# Patient Record
Sex: Female | Born: 1955 | Race: White | Hispanic: No | Smoking: Never smoker
Health system: Southern US, Community
[De-identification: ages and names within clinical notes are randomized; demographics above are authoritative.]

## PROBLEM LIST (undated history)

## (undated) DIAGNOSIS — E1165 Type 2 diabetes mellitus with hyperglycemia: Secondary | ICD-10-CM

## (undated) DIAGNOSIS — E785 Hyperlipidemia, unspecified: Secondary | ICD-10-CM

## (undated) DIAGNOSIS — M5136 Other intervertebral disc degeneration, lumbar region: Secondary | ICD-10-CM

## (undated) DIAGNOSIS — K589 Irritable bowel syndrome without diarrhea: Secondary | ICD-10-CM

## (undated) DIAGNOSIS — N183 Chronic kidney disease, stage 3 unspecified: Secondary | ICD-10-CM

## (undated) DIAGNOSIS — M549 Dorsalgia, unspecified: Secondary | ICD-10-CM

## (undated) DIAGNOSIS — Z87898 Personal history of other specified conditions: Secondary | ICD-10-CM

## (undated) DIAGNOSIS — Q249 Congenital malformation of heart, unspecified: Secondary | ICD-10-CM

## (undated) DIAGNOSIS — F419 Anxiety disorder, unspecified: Secondary | ICD-10-CM

## (undated) DIAGNOSIS — R109 Unspecified abdominal pain: Secondary | ICD-10-CM

## (undated) DIAGNOSIS — K529 Noninfective gastroenteritis and colitis, unspecified: Secondary | ICD-10-CM

## (undated) DIAGNOSIS — D649 Anemia, unspecified: Secondary | ICD-10-CM

## (undated) DIAGNOSIS — F329 Major depressive disorder, single episode, unspecified: Secondary | ICD-10-CM

## (undated) DIAGNOSIS — I10 Essential (primary) hypertension: Secondary | ICD-10-CM

## (undated) DIAGNOSIS — E119 Type 2 diabetes mellitus without complications: Secondary | ICD-10-CM

## (undated) DIAGNOSIS — IMO0002 Reserved for concepts with insufficient information to code with codable children: Secondary | ICD-10-CM

## (undated) DIAGNOSIS — G8929 Other chronic pain: Secondary | ICD-10-CM

## (undated) DIAGNOSIS — N390 Urinary tract infection, site not specified: Secondary | ICD-10-CM

## (undated) DIAGNOSIS — F32A Depression, unspecified: Secondary | ICD-10-CM

## (undated) DIAGNOSIS — M51369 Other intervertebral disc degeneration, lumbar region without mention of lumbar back pain or lower extremity pain: Secondary | ICD-10-CM

## (undated) DIAGNOSIS — N950 Postmenopausal bleeding: Secondary | ICD-10-CM

## (undated) DIAGNOSIS — N898 Other specified noninflammatory disorders of vagina: Secondary | ICD-10-CM

## (undated) DIAGNOSIS — N83202 Unspecified ovarian cyst, left side: Secondary | ICD-10-CM

## (undated) DIAGNOSIS — N281 Cyst of kidney, acquired: Secondary | ICD-10-CM

## (undated) HISTORY — DX: Hyperlipidemia, unspecified: E78.5

## (undated) HISTORY — DX: Essential (primary) hypertension: I10

## (undated) HISTORY — DX: Type 2 diabetes mellitus with hyperglycemia: E11.65

## (undated) HISTORY — DX: Chronic kidney disease, stage 3 (moderate): N18.3

## (undated) HISTORY — DX: Cyst of kidney, acquired: N28.1

## (undated) HISTORY — DX: Unspecified ovarian cyst, left side: N83.202

## (undated) HISTORY — DX: Chronic kidney disease, stage 3 unspecified: N18.30

## (undated) HISTORY — DX: Reserved for concepts with insufficient information to code with codable children: IMO0002

## (undated) HISTORY — PX: CARDIAC SURGERY: SHX584

## (undated) HISTORY — DX: Congenital malformation of heart, unspecified: Q24.9

## (undated) HISTORY — DX: Other specified noninflammatory disorders of vagina: N89.8

## (undated) HISTORY — DX: Postmenopausal bleeding: N95.0

## (undated) HISTORY — DX: Anemia, unspecified: D64.9

---

## 2000-03-13 ENCOUNTER — Emergency Department (HOSPITAL_COMMUNITY): Admission: EM | Admit: 2000-03-13 | Discharge: 2000-03-13 | Payer: Self-pay | Admitting: Emergency Medicine

## 2003-01-14 ENCOUNTER — Emergency Department (HOSPITAL_COMMUNITY): Admission: EM | Admit: 2003-01-14 | Discharge: 2003-01-14 | Payer: Self-pay | Admitting: Emergency Medicine

## 2003-01-20 ENCOUNTER — Encounter: Admission: RE | Admit: 2003-01-20 | Discharge: 2003-01-20 | Payer: Self-pay | Admitting: Internal Medicine

## 2003-01-25 ENCOUNTER — Emergency Department (HOSPITAL_COMMUNITY): Admission: AD | Admit: 2003-01-25 | Discharge: 2003-01-25 | Payer: Self-pay | Admitting: Family Medicine

## 2003-01-28 ENCOUNTER — Encounter: Admission: RE | Admit: 2003-01-28 | Discharge: 2003-01-28 | Payer: Self-pay | Admitting: Internal Medicine

## 2003-02-13 ENCOUNTER — Ambulatory Visit (HOSPITAL_COMMUNITY): Admission: RE | Admit: 2003-02-13 | Discharge: 2003-02-13 | Payer: Self-pay | Admitting: Internal Medicine

## 2003-02-13 ENCOUNTER — Encounter (INDEPENDENT_AMBULATORY_CARE_PROVIDER_SITE_OTHER): Payer: Self-pay | Admitting: Cardiology

## 2003-03-14 ENCOUNTER — Emergency Department (HOSPITAL_COMMUNITY): Admission: AD | Admit: 2003-03-14 | Discharge: 2003-03-14 | Payer: Self-pay | Admitting: Family Medicine

## 2003-04-02 ENCOUNTER — Encounter: Admission: RE | Admit: 2003-04-02 | Discharge: 2003-04-02 | Payer: Self-pay | Admitting: Internal Medicine

## 2003-05-29 ENCOUNTER — Encounter: Admission: RE | Admit: 2003-05-29 | Discharge: 2003-05-29 | Payer: Self-pay | Admitting: Internal Medicine

## 2003-06-10 ENCOUNTER — Emergency Department (HOSPITAL_COMMUNITY): Admission: EM | Admit: 2003-06-10 | Discharge: 2003-06-10 | Payer: Self-pay | Admitting: Family Medicine

## 2003-06-13 ENCOUNTER — Emergency Department (HOSPITAL_COMMUNITY): Admission: EM | Admit: 2003-06-13 | Discharge: 2003-06-13 | Payer: Self-pay | Admitting: Family Medicine

## 2003-07-03 ENCOUNTER — Encounter: Admission: RE | Admit: 2003-07-03 | Discharge: 2003-07-03 | Payer: Self-pay | Admitting: Obstetrics and Gynecology

## 2003-07-03 ENCOUNTER — Encounter (INDEPENDENT_AMBULATORY_CARE_PROVIDER_SITE_OTHER): Payer: Self-pay | Admitting: Specialist

## 2003-07-03 ENCOUNTER — Other Ambulatory Visit: Admission: RE | Admit: 2003-07-03 | Discharge: 2003-07-03 | Payer: Self-pay | Admitting: Family Medicine

## 2003-07-11 ENCOUNTER — Ambulatory Visit (HOSPITAL_COMMUNITY): Admission: RE | Admit: 2003-07-11 | Discharge: 2003-07-11 | Payer: Self-pay | Admitting: Obstetrics and Gynecology

## 2003-07-17 ENCOUNTER — Encounter: Admission: RE | Admit: 2003-07-17 | Discharge: 2003-07-17 | Payer: Self-pay | Admitting: Obstetrics and Gynecology

## 2003-08-19 ENCOUNTER — Encounter: Admission: RE | Admit: 2003-08-19 | Discharge: 2003-08-19 | Payer: Self-pay | Admitting: Internal Medicine

## 2003-09-06 ENCOUNTER — Emergency Department (HOSPITAL_COMMUNITY): Admission: EM | Admit: 2003-09-06 | Discharge: 2003-09-06 | Payer: Self-pay | Admitting: Family Medicine

## 2003-12-25 ENCOUNTER — Ambulatory Visit: Payer: Self-pay | Admitting: Internal Medicine

## 2003-12-29 ENCOUNTER — Ambulatory Visit: Payer: Self-pay | Admitting: Internal Medicine

## 2004-01-12 ENCOUNTER — Ambulatory Visit: Payer: Self-pay | Admitting: Internal Medicine

## 2004-03-30 ENCOUNTER — Ambulatory Visit: Payer: Self-pay | Admitting: Obstetrics and Gynecology

## 2004-03-30 ENCOUNTER — Ambulatory Visit: Payer: Self-pay | Admitting: Internal Medicine

## 2004-04-02 ENCOUNTER — Ambulatory Visit: Payer: Self-pay | Admitting: Internal Medicine

## 2004-04-20 ENCOUNTER — Ambulatory Visit: Payer: Self-pay | Admitting: Obstetrics and Gynecology

## 2004-05-04 ENCOUNTER — Ambulatory Visit: Payer: Self-pay | Admitting: Obstetrics and Gynecology

## 2004-07-06 ENCOUNTER — Ambulatory Visit: Payer: Self-pay | Admitting: Internal Medicine

## 2004-07-12 ENCOUNTER — Ambulatory Visit (HOSPITAL_COMMUNITY): Admission: RE | Admit: 2004-07-12 | Discharge: 2004-07-12 | Payer: Self-pay | Admitting: *Deleted

## 2004-08-03 ENCOUNTER — Ambulatory Visit: Payer: Self-pay | Admitting: Obstetrics & Gynecology

## 2004-08-03 ENCOUNTER — Encounter (INDEPENDENT_AMBULATORY_CARE_PROVIDER_SITE_OTHER): Payer: Self-pay | Admitting: *Deleted

## 2004-11-29 ENCOUNTER — Emergency Department (HOSPITAL_COMMUNITY): Admission: EM | Admit: 2004-11-29 | Discharge: 2004-11-29 | Payer: Self-pay | Admitting: Family Medicine

## 2004-12-13 ENCOUNTER — Ambulatory Visit: Payer: Self-pay | Admitting: Internal Medicine

## 2005-02-03 ENCOUNTER — Ambulatory Visit: Payer: Self-pay | Admitting: Family Medicine

## 2005-02-17 ENCOUNTER — Ambulatory Visit: Payer: Self-pay | Admitting: Internal Medicine

## 2005-02-21 ENCOUNTER — Ambulatory Visit: Payer: Self-pay | Admitting: Internal Medicine

## 2005-06-29 ENCOUNTER — Ambulatory Visit: Payer: Self-pay | Admitting: Internal Medicine

## 2005-07-14 ENCOUNTER — Ambulatory Visit (HOSPITAL_COMMUNITY): Admission: RE | Admit: 2005-07-14 | Discharge: 2005-07-14 | Payer: Self-pay | Admitting: Obstetrics and Gynecology

## 2005-08-03 ENCOUNTER — Ambulatory Visit: Payer: Self-pay | Admitting: Internal Medicine

## 2005-12-15 ENCOUNTER — Ambulatory Visit (HOSPITAL_COMMUNITY): Admission: RE | Admit: 2005-12-15 | Discharge: 2005-12-15 | Payer: Self-pay | Admitting: Hospitalist

## 2005-12-15 ENCOUNTER — Encounter (INDEPENDENT_AMBULATORY_CARE_PROVIDER_SITE_OTHER): Payer: Self-pay | Admitting: *Deleted

## 2005-12-15 ENCOUNTER — Ambulatory Visit: Payer: Self-pay | Admitting: Hospitalist

## 2005-12-15 LAB — CONVERTED CEMR LAB
Albumin: 4.1 g/dL (ref 3.5–5.2)
BUN: 23 mg/dL (ref 6–23)
CO2: 31 meq/L (ref 19–32)
Calcium: 9 mg/dL (ref 8.4–10.5)
Glucose, Bld: 106 mg/dL — ABNORMAL HIGH (ref 70–99)
Microalb Creat Ratio: 71.4 mg/g — ABNORMAL HIGH (ref 0.0–30.0)
Total Bilirubin: 0.3 mg/dL (ref 0.3–1.2)

## 2005-12-27 ENCOUNTER — Ambulatory Visit: Payer: Self-pay | Admitting: *Deleted

## 2005-12-27 ENCOUNTER — Inpatient Hospital Stay (HOSPITAL_COMMUNITY): Admission: EM | Admit: 2005-12-27 | Discharge: 2005-12-28 | Payer: Self-pay | Admitting: Emergency Medicine

## 2005-12-28 ENCOUNTER — Encounter (INDEPENDENT_AMBULATORY_CARE_PROVIDER_SITE_OTHER): Payer: Self-pay | Admitting: *Deleted

## 2005-12-28 DIAGNOSIS — E1169 Type 2 diabetes mellitus with other specified complication: Secondary | ICD-10-CM

## 2005-12-28 DIAGNOSIS — E1122 Type 2 diabetes mellitus with diabetic chronic kidney disease: Secondary | ICD-10-CM

## 2005-12-28 DIAGNOSIS — I10 Essential (primary) hypertension: Secondary | ICD-10-CM

## 2005-12-28 DIAGNOSIS — I152 Hypertension secondary to endocrine disorders: Secondary | ICD-10-CM | POA: Insufficient documentation

## 2005-12-28 DIAGNOSIS — E1159 Type 2 diabetes mellitus with other circulatory complications: Secondary | ICD-10-CM

## 2005-12-28 DIAGNOSIS — D649 Anemia, unspecified: Secondary | ICD-10-CM | POA: Insufficient documentation

## 2005-12-28 DIAGNOSIS — N183 Chronic kidney disease, stage 3 (moderate): Secondary | ICD-10-CM

## 2005-12-28 DIAGNOSIS — E785 Hyperlipidemia, unspecified: Secondary | ICD-10-CM

## 2005-12-29 ENCOUNTER — Emergency Department (HOSPITAL_COMMUNITY): Admission: EM | Admit: 2005-12-29 | Discharge: 2005-12-29 | Payer: Self-pay | Admitting: Family Medicine

## 2006-01-02 ENCOUNTER — Encounter (INDEPENDENT_AMBULATORY_CARE_PROVIDER_SITE_OTHER): Payer: Self-pay | Admitting: Internal Medicine

## 2006-01-02 ENCOUNTER — Ambulatory Visit: Payer: Self-pay | Admitting: Internal Medicine

## 2006-01-02 LAB — CONVERTED CEMR LAB
MCHC: 33.9 g/dL (ref 33.1–35.4)
MCV: 84.5 fL (ref 78.8–100.0)
Platelets: 271 10*3/uL (ref 152–374)

## 2006-01-04 ENCOUNTER — Ambulatory Visit: Payer: Self-pay | Admitting: Obstetrics and Gynecology

## 2006-01-18 DIAGNOSIS — N83209 Unspecified ovarian cyst, unspecified side: Secondary | ICD-10-CM

## 2006-03-13 ENCOUNTER — Telehealth: Payer: Self-pay | Admitting: *Deleted

## 2006-03-28 ENCOUNTER — Encounter: Payer: Self-pay | Admitting: *Deleted

## 2006-03-29 ENCOUNTER — Encounter (INDEPENDENT_AMBULATORY_CARE_PROVIDER_SITE_OTHER): Payer: Self-pay | Admitting: *Deleted

## 2006-03-29 ENCOUNTER — Ambulatory Visit: Payer: Self-pay | Admitting: Internal Medicine

## 2006-03-29 LAB — CONVERTED CEMR LAB
HCT: 42.2 % (ref 36.0–46.0)
Platelets: 229 10*3/uL (ref 150–400)
RBC: 4.98 M/uL (ref 3.87–5.11)
WBC: 8.5 10*3/uL (ref 4.0–10.5)

## 2006-04-04 ENCOUNTER — Ambulatory Visit: Payer: Self-pay | Admitting: Hospitalist

## 2006-04-04 LAB — CONVERTED CEMR LAB
Blood Glucose, Fingerstick: 189
Glucose, Bld: 189 mg/dL

## 2006-04-11 ENCOUNTER — Telehealth: Payer: Self-pay | Admitting: *Deleted

## 2006-04-18 ENCOUNTER — Encounter (INDEPENDENT_AMBULATORY_CARE_PROVIDER_SITE_OTHER): Payer: Self-pay | Admitting: *Deleted

## 2006-04-18 ENCOUNTER — Ambulatory Visit: Payer: Self-pay | Admitting: Internal Medicine

## 2006-04-18 LAB — CONVERTED CEMR LAB
BUN: 31 mg/dL — ABNORMAL HIGH (ref 6–23)
Glucose, Bld: 253 mg/dL — ABNORMAL HIGH (ref 70–99)
Potassium: 4.9 meq/L (ref 3.5–5.3)

## 2006-04-21 ENCOUNTER — Telehealth: Payer: Self-pay | Admitting: *Deleted

## 2006-05-23 ENCOUNTER — Ambulatory Visit: Payer: Self-pay | Admitting: *Deleted

## 2006-05-23 DIAGNOSIS — N951 Menopausal and female climacteric states: Secondary | ICD-10-CM | POA: Insufficient documentation

## 2006-05-23 DIAGNOSIS — R079 Chest pain, unspecified: Secondary | ICD-10-CM

## 2006-05-23 LAB — CONVERTED CEMR LAB
AST: 22 units/L (ref 0–37)
Blood Glucose, Fingerstick: 271
CO2: 27 meq/L (ref 19–32)
Chloride: 96 meq/L (ref 96–112)
Platelets: 242 10*3/uL (ref 150–400)
Potassium: 4 meq/L (ref 3.5–5.3)
RDW: 14.5 % — ABNORMAL HIGH (ref 11.5–14.0)
Sodium: 135 meq/L (ref 135–145)
Total Protein: 6.9 g/dL (ref 6.0–8.3)

## 2006-05-29 ENCOUNTER — Telehealth (INDEPENDENT_AMBULATORY_CARE_PROVIDER_SITE_OTHER): Payer: Self-pay | Admitting: *Deleted

## 2006-06-06 ENCOUNTER — Ambulatory Visit: Payer: Self-pay | Admitting: Hospitalist

## 2006-06-06 ENCOUNTER — Telehealth (INDEPENDENT_AMBULATORY_CARE_PROVIDER_SITE_OTHER): Payer: Self-pay | Admitting: *Deleted

## 2006-06-06 LAB — CONVERTED CEMR LAB: Blood Glucose, Fingerstick: 180

## 2006-06-08 ENCOUNTER — Ambulatory Visit: Payer: Self-pay | Admitting: Internal Medicine

## 2006-06-08 ENCOUNTER — Encounter (INDEPENDENT_AMBULATORY_CARE_PROVIDER_SITE_OTHER): Payer: Self-pay | Admitting: *Deleted

## 2006-06-08 LAB — CONVERTED CEMR LAB
LDL Cholesterol: 77 mg/dL (ref 0–99)
VLDL: 28 mg/dL (ref 0–40)

## 2006-07-07 ENCOUNTER — Telehealth: Payer: Self-pay | Admitting: *Deleted

## 2006-07-15 ENCOUNTER — Telehealth (INDEPENDENT_AMBULATORY_CARE_PROVIDER_SITE_OTHER): Payer: Self-pay | Admitting: Internal Medicine

## 2006-07-18 ENCOUNTER — Ambulatory Visit (HOSPITAL_COMMUNITY): Admission: RE | Admit: 2006-07-18 | Discharge: 2006-07-18 | Payer: Self-pay | Admitting: Obstetrics and Gynecology

## 2006-09-12 ENCOUNTER — Telehealth (INDEPENDENT_AMBULATORY_CARE_PROVIDER_SITE_OTHER): Payer: Self-pay | Admitting: *Deleted

## 2006-10-18 ENCOUNTER — Ambulatory Visit: Payer: Self-pay | Admitting: *Deleted

## 2006-10-18 DIAGNOSIS — R109 Unspecified abdominal pain: Secondary | ICD-10-CM

## 2006-10-18 LAB — CONVERTED CEMR LAB
Blood Glucose, Fingerstick: 255
Hgb A1c MFr Bld: 7.8 %

## 2006-10-20 ENCOUNTER — Telehealth: Payer: Self-pay | Admitting: *Deleted

## 2006-10-23 LAB — CONVERTED CEMR LAB
AST: 19 units/L (ref 0–37)
BUN: 26 mg/dL — ABNORMAL HIGH (ref 6–23)
Calcium: 9.1 mg/dL (ref 8.4–10.5)
Chloride: 98 meq/L (ref 96–112)
Creatinine, Ser: 1.21 mg/dL — ABNORMAL HIGH (ref 0.40–1.20)
Glucose, Bld: 199 mg/dL — ABNORMAL HIGH (ref 70–99)
HCT: 34.8 % — ABNORMAL LOW (ref 36.0–46.0)
Hemoglobin: 11.2 g/dL — ABNORMAL LOW (ref 12.0–15.0)
Potassium: 3.8 meq/L (ref 3.5–5.3)
Total Bilirubin: 0.4 mg/dL (ref 0.3–1.2)
Total Protein: 6.9 g/dL (ref 6.0–8.3)
WBC: 12.2 10*3/uL — ABNORMAL HIGH (ref 4.0–10.5)

## 2006-12-12 ENCOUNTER — Ambulatory Visit: Payer: Self-pay | Admitting: Internal Medicine

## 2007-01-15 ENCOUNTER — Telehealth: Payer: Self-pay | Admitting: *Deleted

## 2007-01-22 ENCOUNTER — Telehealth: Payer: Self-pay | Admitting: *Deleted

## 2007-01-23 ENCOUNTER — Telehealth: Payer: Self-pay | Admitting: *Deleted

## 2007-02-22 ENCOUNTER — Ambulatory Visit: Payer: Self-pay | Admitting: *Deleted

## 2007-02-22 LAB — CONVERTED CEMR LAB: Blood Glucose, Fingerstick: 161

## 2007-03-06 ENCOUNTER — Ambulatory Visit: Payer: Self-pay | Admitting: *Deleted

## 2007-03-08 ENCOUNTER — Encounter (INDEPENDENT_AMBULATORY_CARE_PROVIDER_SITE_OTHER): Payer: Self-pay | Admitting: *Deleted

## 2007-03-08 ENCOUNTER — Ambulatory Visit: Payer: Self-pay | Admitting: Internal Medicine

## 2007-03-09 ENCOUNTER — Emergency Department (HOSPITAL_COMMUNITY): Admission: EM | Admit: 2007-03-09 | Discharge: 2007-03-09 | Payer: Self-pay | Admitting: Emergency Medicine

## 2007-03-09 LAB — CONVERTED CEMR LAB
BUN: 34 mg/dL — ABNORMAL HIGH (ref 6–23)
CEA: <0.5 ng/mL (ref 0.0–5.0)
CO2: 26 meq/L (ref 19–32)
Calcium: 9.8 mg/dL (ref 8.4–10.5)
Glucose, Bld: 152 mg/dL — ABNORMAL HIGH (ref 70–99)
Potassium: 5.8 meq/L — ABNORMAL HIGH (ref 3.5–5.3)
RBC: 4.32 M/uL (ref 3.87–5.11)
RDW: 13.2 % (ref 11.5–15.5)
Sodium: 138 meq/L (ref 135–145)
Total CHOL/HDL Ratio: 3.6
Triglycerides: 132 mg/dL (ref <150)
WBC: 8.8 10*3/uL (ref 4.0–10.5)

## 2007-03-12 ENCOUNTER — Telehealth: Payer: Self-pay | Admitting: *Deleted

## 2007-03-26 ENCOUNTER — Telehealth: Payer: Self-pay | Admitting: *Deleted

## 2007-04-06 ENCOUNTER — Encounter: Payer: Self-pay | Admitting: Internal Medicine

## 2007-04-06 ENCOUNTER — Ambulatory Visit: Payer: Self-pay | Admitting: Internal Medicine

## 2007-04-06 DIAGNOSIS — E875 Hyperkalemia: Secondary | ICD-10-CM

## 2007-04-06 LAB — CONVERTED CEMR LAB
Blood Glucose, Fingerstick: 96
CO2: 30 meq/L (ref 19–32)
Chloride: 99 meq/L (ref 96–112)
Creatinine, Ser: 1.26 mg/dL — ABNORMAL HIGH (ref 0.40–1.20)
Potassium: 5.1 meq/L (ref 3.5–5.3)
Sodium: 138 meq/L (ref 135–145)

## 2007-04-20 ENCOUNTER — Ambulatory Visit: Payer: Self-pay | Admitting: *Deleted

## 2007-04-20 ENCOUNTER — Encounter: Payer: Self-pay | Admitting: Internal Medicine

## 2007-04-20 LAB — CONVERTED CEMR LAB
BUN: 29 mg/dL — ABNORMAL HIGH (ref 6–23)
CO2: 33 meq/L — ABNORMAL HIGH (ref 19–32)
Chloride: 96 meq/L (ref 96–112)
Creatinine, Ser: 1.25 mg/dL — ABNORMAL HIGH (ref 0.40–1.20)
Glucose, Bld: 106 mg/dL — ABNORMAL HIGH (ref 70–99)
Potassium: 4.6 meq/L (ref 3.5–5.3)

## 2007-04-25 ENCOUNTER — Telehealth (INDEPENDENT_AMBULATORY_CARE_PROVIDER_SITE_OTHER): Payer: Self-pay | Admitting: *Deleted

## 2007-06-25 ENCOUNTER — Telehealth (INDEPENDENT_AMBULATORY_CARE_PROVIDER_SITE_OTHER): Payer: Self-pay | Admitting: *Deleted

## 2007-07-10 ENCOUNTER — Encounter (INDEPENDENT_AMBULATORY_CARE_PROVIDER_SITE_OTHER): Payer: Self-pay | Admitting: *Deleted

## 2007-07-17 ENCOUNTER — Ambulatory Visit: Payer: Self-pay | Admitting: *Deleted

## 2007-07-17 LAB — CONVERTED CEMR LAB
Albumin: 4.5 g/dL (ref 3.5–5.2)
BUN: 37 mg/dL — ABNORMAL HIGH (ref 6–23)
Calcium: 9.4 mg/dL (ref 8.4–10.5)
Chloride: 97 meq/L (ref 96–112)
Glucose, Bld: 95 mg/dL (ref 70–99)
Potassium: 4.1 meq/L (ref 3.5–5.3)
Sodium: 135 meq/L (ref 135–145)
Total Protein: 7.4 g/dL (ref 6.0–8.3)

## 2007-07-23 ENCOUNTER — Telehealth (INDEPENDENT_AMBULATORY_CARE_PROVIDER_SITE_OTHER): Payer: Self-pay | Admitting: *Deleted

## 2007-07-31 ENCOUNTER — Ambulatory Visit (HOSPITAL_COMMUNITY): Admission: RE | Admit: 2007-07-31 | Discharge: 2007-07-31 | Payer: Self-pay | Admitting: *Deleted

## 2007-09-28 ENCOUNTER — Telehealth (INDEPENDENT_AMBULATORY_CARE_PROVIDER_SITE_OTHER): Payer: Self-pay | Admitting: *Deleted

## 2007-12-11 ENCOUNTER — Ambulatory Visit: Payer: Self-pay | Admitting: *Deleted

## 2007-12-11 LAB — CONVERTED CEMR LAB
Blood Glucose, Fingerstick: 120
Hgb A1c MFr Bld: 7.4 %

## 2007-12-12 LAB — CONVERTED CEMR LAB
ALT: 21 units/L (ref 0–35)
AST: 22 units/L (ref 0–37)
BUN: 39 mg/dL — ABNORMAL HIGH (ref 6–23)
CO2: 25 meq/L (ref 19–32)
Calcium: 9 mg/dL (ref 8.4–10.5)
Chloride: 99 meq/L (ref 96–112)
Cholesterol: 137 mg/dL (ref 0–200)
Creatinine, Ser: 1.49 mg/dL — ABNORMAL HIGH (ref 0.40–1.20)
HDL: 43 mg/dL (ref >39)
Total Bilirubin: 0.3 mg/dL (ref 0.3–1.2)
Total CHOL/HDL Ratio: 3.2
VLDL: 38 mg/dL (ref 0–40)

## 2008-03-06 ENCOUNTER — Telehealth: Payer: Self-pay | Admitting: *Deleted

## 2008-03-07 ENCOUNTER — Telehealth: Payer: Self-pay | Admitting: *Deleted

## 2008-03-19 ENCOUNTER — Telehealth (INDEPENDENT_AMBULATORY_CARE_PROVIDER_SITE_OTHER): Payer: Self-pay | Admitting: *Deleted

## 2008-03-21 ENCOUNTER — Telehealth (INDEPENDENT_AMBULATORY_CARE_PROVIDER_SITE_OTHER): Payer: Self-pay | Admitting: *Deleted

## 2008-04-10 ENCOUNTER — Telehealth (INDEPENDENT_AMBULATORY_CARE_PROVIDER_SITE_OTHER): Payer: Self-pay | Admitting: *Deleted

## 2008-04-11 ENCOUNTER — Ambulatory Visit: Payer: Self-pay | Admitting: *Deleted

## 2008-04-11 LAB — CONVERTED CEMR LAB
BUN: 42 mg/dL — ABNORMAL HIGH (ref 6–23)
Calcium: 9.8 mg/dL (ref 8.4–10.5)
Glucose, Bld: 98 mg/dL (ref 70–99)
Potassium: 4.4 meq/L (ref 3.5–5.3)

## 2008-05-05 ENCOUNTER — Telehealth (INDEPENDENT_AMBULATORY_CARE_PROVIDER_SITE_OTHER): Payer: Self-pay | Admitting: *Deleted

## 2008-06-03 ENCOUNTER — Telehealth (INDEPENDENT_AMBULATORY_CARE_PROVIDER_SITE_OTHER): Payer: Self-pay | Admitting: *Deleted

## 2008-06-05 ENCOUNTER — Encounter (INDEPENDENT_AMBULATORY_CARE_PROVIDER_SITE_OTHER): Payer: Self-pay | Admitting: *Deleted

## 2008-06-05 ENCOUNTER — Ambulatory Visit: Payer: Self-pay | Admitting: Infectious Disease

## 2008-06-05 LAB — CONVERTED CEMR LAB
Calcium: 9.1 mg/dL (ref 8.4–10.5)
GFR calc Af Amer: 54 mL/min — ABNORMAL LOW (ref >60)
GFR calc non Af Amer: 44 mL/min — ABNORMAL LOW (ref >60)
Potassium: 3.9 meq/L (ref 3.5–5.3)
Sodium: 139 meq/L (ref 135–145)

## 2008-06-07 ENCOUNTER — Telehealth: Payer: Self-pay | Admitting: Internal Medicine

## 2008-06-12 ENCOUNTER — Ambulatory Visit: Payer: Self-pay | Admitting: Internal Medicine

## 2008-06-12 ENCOUNTER — Encounter (INDEPENDENT_AMBULATORY_CARE_PROVIDER_SITE_OTHER): Payer: Self-pay | Admitting: Internal Medicine

## 2008-06-12 DIAGNOSIS — N390 Urinary tract infection, site not specified: Secondary | ICD-10-CM | POA: Insufficient documentation

## 2008-06-12 DIAGNOSIS — F411 Generalized anxiety disorder: Secondary | ICD-10-CM

## 2008-06-12 DIAGNOSIS — N183 Chronic kidney disease, stage 3 (moderate): Secondary | ICD-10-CM

## 2008-06-12 DIAGNOSIS — N950 Postmenopausal bleeding: Secondary | ICD-10-CM

## 2008-06-12 DIAGNOSIS — R6510 Systemic inflammatory response syndrome (SIRS) of non-infectious origin without acute organ dysfunction: Secondary | ICD-10-CM | POA: Insufficient documentation

## 2008-06-12 HISTORY — DX: Postmenopausal bleeding: N95.0

## 2008-06-13 ENCOUNTER — Telehealth: Payer: Self-pay | Admitting: Infectious Disease

## 2008-06-13 ENCOUNTER — Telehealth: Payer: Self-pay | Admitting: Internal Medicine

## 2008-06-13 ENCOUNTER — Encounter (INDEPENDENT_AMBULATORY_CARE_PROVIDER_SITE_OTHER): Payer: Self-pay | Admitting: Internal Medicine

## 2008-06-13 ENCOUNTER — Emergency Department (HOSPITAL_COMMUNITY): Admission: EM | Admit: 2008-06-13 | Discharge: 2008-06-13 | Payer: Self-pay | Admitting: Emergency Medicine

## 2008-06-13 LAB — CONVERTED CEMR LAB
Basophils Absolute: 0 10*3/uL (ref 0.0–0.1)
Chloride: 96 meq/L (ref 96–112)
GFR calc Af Amer: 37 mL/min — ABNORMAL LOW (ref >60)
GFR calc non Af Amer: 31 mL/min — ABNORMAL LOW (ref >60)
HCT: 29.2 % — ABNORMAL LOW (ref 36.0–46.0)
Hemoglobin: 10.1 g/dL — ABNORMAL LOW (ref 12.0–15.0)
INR: 1 (ref 0.0–1.5)
Lymphocytes Relative: 14 % (ref 12–46)
Lymphs Abs: 1.9 10*3/uL (ref 0.7–4.0)
Monocytes Absolute: 0.4 10*3/uL (ref 0.1–1.0)
Monocytes Relative: 3 % (ref 3–12)
Neutro Abs: 11.9 10*3/uL — ABNORMAL HIGH (ref 1.7–7.7)
Potassium: 4.1 meq/L (ref 3.5–5.3)
Prothrombin Time: 13.7 s (ref 11.6–15.2)
RBC: 3.24 M/uL — ABNORMAL LOW (ref 3.87–5.11)
RDW: 13.8 % (ref 11.5–15.5)
Sodium: 134 meq/L — ABNORMAL LOW (ref 135–145)
WBC: 14.3 10*3/uL — ABNORMAL HIGH (ref 4.0–10.5)

## 2008-06-16 ENCOUNTER — Ambulatory Visit: Payer: Self-pay | Admitting: *Deleted

## 2008-06-16 ENCOUNTER — Encounter (INDEPENDENT_AMBULATORY_CARE_PROVIDER_SITE_OTHER): Payer: Self-pay | Admitting: *Deleted

## 2008-06-16 ENCOUNTER — Telehealth (INDEPENDENT_AMBULATORY_CARE_PROVIDER_SITE_OTHER): Payer: Self-pay | Admitting: *Deleted

## 2008-06-16 LAB — CONVERTED CEMR LAB
BUN: 21 mg/dL (ref 6–23)
Basophils Absolute: 0 10*3/uL (ref 0.0–0.1)
Basophils Relative: 0 % (ref 0–1)
Bilirubin Urine: NEGATIVE
Chloride: 95 meq/L — ABNORMAL LOW (ref 96–112)
Eosinophils Absolute: 0.1 10*3/uL (ref 0.0–0.7)
Eosinophils Relative: 1 % (ref 0–5)
Glucose, Bld: 178 mg/dL — ABNORMAL HIGH (ref 70–99)
HCT: 23.7 % — ABNORMAL LOW (ref 36.0–46.0)
Hemoglobin: 8.3 g/dL — ABNORMAL LOW (ref 12.0–15.0)
Hgb A1c MFr Bld: 6.7 %
MCHC: 34.9 g/dL (ref 30.0–36.0)
MCV: 89.2 fL (ref 78.0–100.0)
Monocytes Absolute: 0.5 10*3/uL (ref 0.1–1.0)
Nitrite: NEGATIVE
Potassium: 3.4 meq/L — ABNORMAL LOW (ref 3.5–5.3)
Protein, U semiquant: NEGATIVE
RDW: 13.6 % (ref 11.5–15.5)
Specific Gravity, Urine: <1.005
WBC Urine, dipstick: NEGATIVE
pH: 5.5

## 2008-06-17 ENCOUNTER — Encounter (INDEPENDENT_AMBULATORY_CARE_PROVIDER_SITE_OTHER): Payer: Self-pay | Admitting: *Deleted

## 2008-06-20 ENCOUNTER — Ambulatory Visit (HOSPITAL_COMMUNITY): Admission: RE | Admit: 2008-06-20 | Discharge: 2008-06-20 | Payer: Self-pay | Admitting: *Deleted

## 2008-06-20 DIAGNOSIS — Q512 Other doubling of uterus, unspecified: Secondary | ICD-10-CM

## 2008-06-24 ENCOUNTER — Telehealth: Payer: Self-pay | Admitting: *Deleted

## 2008-06-25 ENCOUNTER — Ambulatory Visit: Payer: Self-pay | Admitting: *Deleted

## 2008-06-25 LAB — CONVERTED CEMR LAB
CO2: 31 meq/L (ref 19–32)
Calcium: 9.4 mg/dL (ref 8.4–10.5)
Creatinine, Ser: 1.4 mg/dL — ABNORMAL HIGH (ref 0.40–1.20)
Glucose, Bld: 176 mg/dL — ABNORMAL HIGH (ref 70–99)
MCHC: 34 g/dL (ref 30.0–36.0)
MCV: 87.7 fL (ref 78.0–100.0)
RBC: 3.04 M/uL — ABNORMAL LOW (ref 3.87–5.11)
Sodium: 136 meq/L (ref 135–145)

## 2008-06-29 ENCOUNTER — Telehealth (INDEPENDENT_AMBULATORY_CARE_PROVIDER_SITE_OTHER): Payer: Self-pay | Admitting: Internal Medicine

## 2008-08-07 ENCOUNTER — Encounter: Payer: Self-pay | Admitting: Obstetrics and Gynecology

## 2008-08-07 ENCOUNTER — Ambulatory Visit: Payer: Self-pay | Admitting: Obstetrics and Gynecology

## 2008-08-07 LAB — CONVERTED CEMR LAB: FSH: 81.4 milliintl units/mL

## 2008-08-31 ENCOUNTER — Emergency Department (HOSPITAL_COMMUNITY): Admission: EM | Admit: 2008-08-31 | Discharge: 2008-08-31 | Payer: Self-pay | Admitting: Family Medicine

## 2008-09-17 ENCOUNTER — Ambulatory Visit: Payer: Self-pay | Admitting: Obstetrics and Gynecology

## 2008-09-29 ENCOUNTER — Telehealth: Payer: Self-pay | Admitting: Internal Medicine

## 2008-12-05 ENCOUNTER — Telehealth: Payer: Self-pay | Admitting: Internal Medicine

## 2008-12-24 ENCOUNTER — Ambulatory Visit: Payer: Self-pay | Admitting: Infectious Disease

## 2008-12-24 DIAGNOSIS — R32 Unspecified urinary incontinence: Secondary | ICD-10-CM

## 2008-12-24 LAB — CONVERTED CEMR LAB
BUN: 39 mg/dL — ABNORMAL HIGH (ref 6–23)
Basophils Relative: 0 % (ref 0–1)
CO2: 25 meq/L (ref 19–32)
Creatinine, Ser: 1.44 mg/dL — ABNORMAL HIGH (ref 0.40–1.20)
Eosinophils Absolute: 0.1 10*3/uL (ref 0.0–0.7)
Eosinophils Relative: 1 % (ref 0–5)
Glucose, Bld: 136 mg/dL — ABNORMAL HIGH (ref 70–99)
Hemoglobin: 13 g/dL (ref 12.0–15.0)
MCHC: 33.4 g/dL (ref 30.0–36.0)
MCV: 86.4 fL (ref >=78.0)
Monocytes Absolute: 0.6 10*3/uL (ref 0.1–1.0)
Monocytes Relative: 7 % (ref 3–12)
Neutrophils Relative %: 59 % (ref 43–77)
Nitrite: NEGATIVE
RBC / HPF: NONE SEEN (ref <3)
RBC: 4.5 M/uL (ref 3.87–5.11)
Total Bilirubin: 0.4 mg/dL (ref 0.3–1.2)
Urine Glucose: NEGATIVE mg/dL
pH: 6 (ref 5.0–8.0)

## 2008-12-29 ENCOUNTER — Ambulatory Visit (HOSPITAL_COMMUNITY): Admission: RE | Admit: 2008-12-29 | Discharge: 2008-12-29 | Payer: Self-pay | Admitting: Infectious Disease

## 2009-01-06 ENCOUNTER — Encounter: Payer: Self-pay | Admitting: Internal Medicine

## 2009-01-14 ENCOUNTER — Ambulatory Visit (HOSPITAL_COMMUNITY): Admission: RE | Admit: 2009-01-14 | Discharge: 2009-01-14 | Payer: Self-pay | Admitting: Internal Medicine

## 2009-01-15 ENCOUNTER — Encounter: Payer: Self-pay | Admitting: Internal Medicine

## 2009-01-15 DIAGNOSIS — N281 Cyst of kidney, acquired: Secondary | ICD-10-CM

## 2009-01-15 HISTORY — DX: Cyst of kidney, acquired: N28.1

## 2009-02-17 ENCOUNTER — Telehealth (INDEPENDENT_AMBULATORY_CARE_PROVIDER_SITE_OTHER): Payer: Self-pay | Admitting: *Deleted

## 2009-03-30 ENCOUNTER — Telehealth: Payer: Self-pay | Admitting: Internal Medicine

## 2009-04-10 ENCOUNTER — Telehealth: Payer: Self-pay | Admitting: Internal Medicine

## 2009-06-24 ENCOUNTER — Encounter: Payer: Self-pay | Admitting: Internal Medicine

## 2009-08-11 ENCOUNTER — Telehealth: Payer: Self-pay | Admitting: *Deleted

## 2009-08-24 ENCOUNTER — Telehealth: Payer: Self-pay | Admitting: *Deleted

## 2009-09-15 ENCOUNTER — Telehealth: Payer: Self-pay | Admitting: *Deleted

## 2009-11-02 ENCOUNTER — Telehealth (INDEPENDENT_AMBULATORY_CARE_PROVIDER_SITE_OTHER): Payer: Self-pay | Admitting: *Deleted

## 2009-11-11 ENCOUNTER — Ambulatory Visit: Payer: Self-pay | Admitting: Internal Medicine

## 2009-11-18 ENCOUNTER — Telehealth (INDEPENDENT_AMBULATORY_CARE_PROVIDER_SITE_OTHER): Payer: Self-pay | Admitting: *Deleted

## 2010-01-21 ENCOUNTER — Ambulatory Visit: Payer: Self-pay

## 2010-03-09 NOTE — Progress Notes (Signed)
Summary: med refill/gp  Phone Note Refill Request Message from:  Fax from Pharmacy on March 30, 2009 11:54 AM  Refills Requested: Medication #1:  PRAVACHOL 40 MG TABS Take one tablet by mouth daily   Last Refilled: 02/20/2009  Method Requested: Electronic Initial call taken by: Chinita Pester RN,  March 30, 2009 11:54 AM    Prescriptions: PRAVACHOL 40 MG TABS (PRAVASTATIN SODIUM) Take one tablet by mouth daily  #30 x 6   Entered and Authorized by:   Hartley Barefoot MD   Signed by:   Hartley Barefoot MD on 03/31/2009   Method used:   Electronically to        [facility] [phone]* (retail)       [address]       [place], [place]  [address]       Ph: [phone]       Fax: [phone]   RxID:   [id number]

## 2010-03-09 NOTE — Progress Notes (Signed)
Summary: Refill/gh  Phone Note Refill Request Message from:  Fax from Pharmacy on April 10, 2009 11:13 AM  Refills Requested: Medication #1:  ENALAPRIL-HYDROCHLOROTHIAZIDE 5-12.5 MG TABS Take 1 tablet by mouth once a day   Last Refilled: 03/12/2009  Method Requested: Electronic Initial call taken by: Angelina Ok RN,  April 10, 2009 11:14 AM    Prescriptions: ENALAPRIL-HYDROCHLOROTHIAZIDE 5-12.5 MG TABS (ENALAPRIL-HYDROCHLOROTHIAZIDE) Take 1 tablet by mouth once a day  #30 x 3   Entered and Authorized by:   Hartley Barefoot MD   Signed by:   Hartley Barefoot MD on 04/10/2009   Method used:   Electronically to        [facility] [phone]* (retail)       [address]       [place], [place]  [address]       Ph: [phone]       Fax: [phone]   RxID:   [id number]

## 2010-03-09 NOTE — Progress Notes (Signed)
Summary: refill/gg  Phone Note Refill Request  on August 11, 2009 3:26 PM  Refills Requested: Medication #1:  GLIPIZIDE 5 MG  TABS Take 2  tablet by mouth in the morning and one tablet at night.   Last Refilled: 07/27/2009  Method Requested: Electronic Initial call taken by: Merrie Roof RN,  August 11, 2009 3:29 PM  Follow-up for Phone Call        Refill approved-nurse to complete.  Please schedule a follow-up appointment and notify patient. Follow-up by: Margarito Liner MD,  August 11, 2009 5:26 PM  Additional Follow-up for Phone Call Additional follow up Details #1::        Rx faxed to pharmacy [facility] to [facility] for appointment Additional Follow-up by: Merrie Roof RN,  August 14, 2009 9:02 AM    Prescriptions: GLIPIZIDE 5 MG  TABS (GLIPIZIDE) Take 2  tablet by mouth in the morning and one tablet at night.  #90 x 0   Entered and Authorized by:   Margarito Liner MD   Signed by:   Margarito Liner MD on 08/11/2009   Method used:   Telephoned to ...       [facility]* (retail)       [address]       [place], [place]  [address]       Ph: [phone]       Fax: [phone]   RxID:   [id number]

## 2010-03-09 NOTE — Progress Notes (Signed)
Summary: med refill/gp  Phone Note Refill Request Message from:  Fax from Pharmacy on November 02, 2009 4:01 PM  Refills Requested: Medication #1:  GLIPIZIDE 5 MG  TABS Take 2  tablet by mouth in the morning and one tablet at night.   Last Refilled: 09/19/2009 Pt.was called about scheduling an appt.; no answer nor answering machine.   Method Requested: Electronic Initial call taken by: Chinita Pester RN,  November 02, 2009 4:01 PM  Follow-up for Phone Call        appointment scheduled for 10/5. Please refill for one month Follow-up by: Merrie Roof RN,  November 02, 2009 6:01 PM  Additional Follow-up for Phone Call Additional follow up Details #1::        ok Additional Follow-up by: Julaine Fusi  DO,  November 03, 2009 12:37 PM    Prescriptions: GLIPIZIDE 5 MG  TABS (GLIPIZIDE) Take 2  tablet by mouth in the morning and one tablet at night.  #90 x 0   Entered and Authorized by:   Julaine Fusi  DO   Signed by:   Julaine Fusi  DO on 11/03/2009   Method used:   Electronically to        [facility] [address] (retail)       [address]       [place], [place]  [address]       Ph: [phone] or [phone]       Fax: [phone]   RxID:   [id number]

## 2010-03-09 NOTE — Progress Notes (Signed)
Summary: refill/ hla  Phone Note Refill Request Message from:  Patient on August 24, 2009 12:07 PM  Refills Requested: Medication #1:  ENALAPRIL-HYDROCHLOROTHIAZIDE 5-12.5 MG TABS Take 1 tablet by mouth once a day   Dosage confirmed as above?Dosage Confirmed last visit and labs 12/24/2008  Initial call taken by: Marin Roberts RN,  August 24, 2009 12:07 PM    Prescriptions: ENALAPRIL-HYDROCHLOROTHIAZIDE 5-12.5 MG TABS (ENALAPRIL-HYDROCHLOROTHIAZIDE) Take 1 tablet by mouth once a day  #30 x 3   Entered and Authorized by:   Zoila Shutter MD   Signed by:   Zoila Shutter MD on 08/24/2009   Method used:   Electronically to        [facility]* (retail)       [address]       [place], [place]  [address]       Ph: [phone]       Fax: [phone]   RxID:   [id number]

## 2010-03-09 NOTE — Letter (Signed)
Summary: [facility]   Imported By: Margie Billet 06/25/2009 11:08:38  _____________________________________________________________________  External Attachment:    Type:   Image     Comment:   External Document

## 2010-03-09 NOTE — Progress Notes (Signed)
Summary: med refill/gp  Phone Note Refill Request Message from:  Fax from Pharmacy on November 18, 2009 10:45 AM  Refills Requested: Medication #1:  PRAVACHOL 40 MG TABS Take one tablet by mouth daily   Last Refilled: 10/14/2009 Last appt.10/5   Method Requested: Electronic Initial call taken by: Chinita Pester RN,  November 18, 2009 10:45 AM    Prescriptions: PRAVACHOL 40 MG TABS (PRAVASTATIN SODIUM) Take one tablet by mouth daily  #30 x 6   Entered and Authorized by:   Zoila Shutter MD   Signed by:   Zoila Shutter MD on 11/18/2009   Method used:   Electronically to        [facility] [address] [address] (retail)       [address]       [place], [place]  [address]       Ph: [phone] or [phone]       Fax: [phone]   RxID:   [id number]

## 2010-03-09 NOTE — Assessment & Plan Note (Signed)
Summary: DM/gg   Vital Signs:  Patient profile:   55 year old female Height:      64 inches (162.56 cm) Weight:      168.4 pounds (76.55 kg) BMI:     29.01 Temp:     98.8 degrees F (37.11 degrees C) oral Pulse rate:   97 / minute BP sitting:   113 / 76  (left arm) Cuff size:   regular  Vitals Entered By: Cynda Familia Duncan Dull) (November 11, 2009 4:08 PM) CC: routine f/u, flu vaccine, right shoulder/ neck pain off and on x 1 mth Is Patient Diabetic? Yes Did you bring your meter with you today? Yes Pain Assessment Patient in pain? yes     Location: left shoulder Intensity: 5 Type: burning Onset of pain  Intermittent x Nutritional Status BMI of 25 - 29 = overweight CBG Result 209  Have you ever been in a relationship where you felt threatened, hurt or afraid?No   Does patient need assistance? Functional Status Self care Ambulation Normal   Diabetic Foot Exam Foot Inspection Is there a history of a foot ulcer?              No Is there a foot ulcer now?              No Can the patient see the bottom of their feet?          Yes Are the shoes appropriate in style and fit?          Yes Is there swelling or an abnormal foot shape?          No Are the toenails long?                No Are the toenails thick?                No Are the toenails ingrown?              No  Diabetic Foot Care Education Patient educated on appropriate care of diabetic feet.  Pulse Check          Right Foot          Left Foot Posterior Tibial:        normal            normal Dorsalis Pedis:        normal            normal  High Risk Feet? No   10-g (5.07) Semmes-Weinstein Monofilament Test Performed by: Lynn Ito          Right Foot          Left Foot Site 1         normal         normal Site 4         normal         normal Site 5         normal         normal Site 6         normal         normal  Impression      normal         normal   Primary Care Provider:  Hartley Barefoot  MD  CC:  routine f/u, flu vaccine, and right shoulder/ neck pain off and on x 1 mth.  History of Present Illness: 55 yr old woman with pmhx as described below  comes to the clinic for follow up. Patient has no complains other than some periodic shoulder pain that is relieved with tylenol.   Reports that she has missed taking her diabetes medicaiton because her father recently died and her daughter been going to several doctor visits. Reports that she will start taking better care of herself  Would like flu shot.   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 Take 2  Tablet By Mouth in The Morning and One Tablet At Night. 2)  Pravachol 40 Mg Tabs (Pravastatin Sodium) .... Take One Tablet By Mouth Daily 3)  Enalapril-Hydrochlorothiazide 5-12.5 Mg Tabs (Enalapril-Hydrochlorothiazide) .... Take 1 Tablet By Mouth Once A Day 4)  Freestyle Lite Test  Strp (Glucose Blood) .... Use To Test Blood Sugar 1-2x Daily 5)  Truetrack Test  Strp (Glucose Blood) .... Use To Test Blood Sugar 1-2x Daily  Current  Medications (verified): 1)  Glipizide 5 Mg  Tabs (Glipizide) .... Take 2  Tablet By Mouth in The Morning and One Tablet At Night. 2)  Pravachol 40 Mg Tabs (Pravastatin Sodium) .... Take One Tablet By Mouth Daily 3)  Enalapril-Hydrochlorothiazide 5-12.5 Mg Tabs (Enalapril-Hydrochlorothiazide) .... Take 1 Tablet By Mouth Once A Day 4)  Freestyle Lite Test  Strp (Glucose Blood) .... Use To Test Blood Sugar 1-2x Daily 5)  Truetrack Test  Strp (Glucose Blood) .... 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    Risk Factors: Exercise: yes (06/25/2008)  Risk Factors: Smoking Status: never (11/11/2009) Passive Smoke Exposure: no (11/11/2009)  Family History: Reviewed history from 02/22/2007 and no changes required. Daughter with POTS  Social History: Reviewed history from 02/22/2007 and no changes required. Single Never Smoked Alcohol use-no Cares for handicapped daughter, [name].    Review of Systems  The patient denies fever, chest pain, dyspnea on exertion, hemoptysis, abdominal pain, melena, hematochezia, hematuria, muscle weakness, difficulty walking, and unusual weight change.    Physical Exam  General:  alert and well-developed.   Lungs:  normal respiratory effort, no intercostal retractions, no accessory muscle use, and normal breath sounds.   Heart:  normal rate, regular rhythm,  and no murmur.     Abdomen:  soft, non-tender, normal bowel sounds, and no distention.   Extremities:  no edema. Neurologic:  Nonfocal  Diabetes Management Exam:    Foot Exam (with socks and/or shoes not present):       Sensory-Monofilament:          Left foot: normal          Right foot: normal   Impression & Recommendations:  Problem # 1:  DIABETES MELLITUS, TYPE II (ICD-250.00) HbA1c elevated. If on follow up not better will consider adding januvia at renal adjusted dose. Diabetic foot exam done today.  Her updated medication list for this problem includes:    Glipizide 5 Mg Tabs (Glipizide) .Marland Kitchen... Take 2  tablet by mouth in the morning and one tablet at night.    Enalapril-hydrochlorothiazide 5-12.5 Mg Tabs (Enalapril-hydrochlorothiazide) .Marland Kitchen... Take 1 tablet by mouth once a day  Orders: T-Hgb A1C (in-house) ([id number]) T- Capillary Blood Glucose ([id number]) Ophthalmology Referral (Ophthalmology)  Labs Reviewed: Creat: 1.44 (12/24/2008)    Reviewed HgBA1c results: 9.0 (11/11/2009)  8.8 (12/24/2008)  Problem # 2:  HYPERTENSION (ICD-401.9) At goal. Continue current regimen.  Her updated medication list for this problem includes:    Enalapril-hydrochlorothiazide 5-12.5 Mg Tabs (Enalapril-hydrochlorothiazide) .Marland Kitchen... Take 1 tablet by mouth once a day  BP today: 113/76 Prior BP: 140/96 (12/24/2008)  Labs Reviewed: K+: 4.2 (12/24/2008) Creat: : 1.44 (12/24/2008)   Chol: 137 (12/11/2007)   HDL: 43 (12/11/2007)   LDL: 56 (12/11/2007)   TG: 190 (12/11/2007)  Problem # 3:  RENAL INSUFFICIENCY, CHRONIC (ICD-585.9) Review labs and reasses. If worse renal function will consider early referral to Nephrologist.  Labs Reviewed: BUN: 39 (12/24/2008)   Cr: 1.44 (12/24/2008)    Hgb: 13.0 (12/24/2008)   Hct: 38.9 (12/24/2008)   Ca++: 9.7 (12/24/2008)    TP: 7.6 (12/24/2008)   Alb: 4.6 (12/24/2008)  Problem # 4:  HYPERLIPIDEMIA (ICD-272.4) Last LDL at goal. Will recheck FLP and reasses.  Her updated  medication list for this problem includes:    Pravachol 40 Mg Tabs (Pravastatin sodium) .Marland Kitchen... Take one tablet by mouth daily  Future Orders: T-CMP with Estimated GFR ([id number]) ... 11/12/2009 T-Lipid Profile [phone] [phone]) ... 11/12/2009 T-TSH [id number]) ... 11/12/2009  Labs Reviewed: SGOT: 33 (12/24/2008)   SGPT: 33 (12/24/2008)   HDL:43 (12/11/2007), 43 (03/08/2007)  LDL:56 (12/11/2007), 84 (03/08/2007)  Chol:137 (12/11/2007), 153 (03/08/2007)  Trig:190 (12/11/2007), 132 (03/08/2007)  Problem # 5:  ANEMIA-NOS (ICD-285.9) Check hgb and reassess.  Future Orders: T-CBC No Diff ([id number]) ... 11/12/2009  Problem # 6:  Preventive Health Care (ICD-V70.0) Patient received flu shot today. Made referral for Mammogram and Diabetic Eye exam. Consider referral for Colonoscopy if not already done on follow up.  Complete Medication List: 1)  Glipizide 5 Mg Tabs (Glipizide) .... Take 2  tablet by mouth in the morning and one tablet at night. 2)  Pravachol 40 Mg Tabs (Pravastatin sodium) .... Take one tablet by mouth daily 3)  Enalapril-hydrochlorothiazide 5-12.5 Mg Tabs (Enalapril-hydrochlorothiazide) .... Take 1 tablet by mouth once a day 4)  Freestyle Lite Test Strp (Glucose blood) .... Use to test blood sugar 1-2x daily 5)  Truetrack Test Strp (Glucose blood) .... Use to test blood sugar 1-2x daily  Other Orders: Mammogram (Screening) (Mammo) Admin 1st Vaccine ([id number]) Flu Vaccine 55yrs + (62376)  Patient Instructions: 1)  Please schedule a follow-up appointment in 3 months. 2)  Take all medicaiton as directed. 3)  You will be called  with any abnormalities in the tests scheduled or performed today.  If you don't hear from [place] within a week from when the test was performed, you can assume that your test was normal.   Prevention & Chronic Care Immunizations   Influenza vaccine: Fluvax 3+  (11/11/2009)   Influenza vaccine due: 12/12/2007    Tetanus booster: Not documented     Pneumococcal vaccine: Not documented  Colorectal Screening   Hemoccult: Not documented    Colonoscopy: Not documented  Other Screening   Pap smear: Normal  (08/03/2004)   Pap smear due: 08/03/2005    Mammogram: Assessment: BIRADS 1.   (07/31/2007)   Mammogram action/deferral: Ordered  (11/11/2009)   Mammogram due: 07/15/2006   Smoking status: never  (11/11/2009)  Diabetes Mellitus   HgbA1C: 9.0  (11/11/2009)   Hemoglobin A1C due: 05/23/2007    Eye exam: Not documented    Foot exam: yes  (11/11/2009)   Foot exam action/deferral: Do today   High risk foot: No  (11/11/2009)   Foot care education: Done  (11/11/2009)   Foot exam due: 06/06/2007    Urine microalbumin/creatinine ratio: 7.3  (07/17/2007)   Urine microalbumin/cr due: 12/16/2006    Diabetes flowsheet reviewed?: Yes   Progress toward A1C goal: Deteriorated  Lipids   Total Cholesterol: 137  (12/11/2007)   LDL: 56  (12/11/2007)   LDL Direct: 76  (07/17/2007)   HDL: 43  (12/11/2007)   Triglycerides: 190  (12/11/2007)    SGOT (AST): 33  (12/24/2008)   SGPT (ALT): 33  (12/24/2008)   Alkaline phosphatase: 125  (12/24/2008)   Total bilirubin: 0.4  (12/24/2008)    Lipid flowsheet reviewed?: Yes   Progress toward LDL goal: At goal  Hypertension   Last Blood Pressure: 113 / 76  (11/11/2009)   Serum creatinine: 1.44  (12/24/2008)   Serum potassium 4.2  (12/24/2008)    Hypertension flowsheet reviewed?: Yes   Progress toward BP goal: At goal  Self-Management Support :   Personal Goals (by the next clinic visit) :     Personal A1C goal: 7  (11/11/2009)     Personal blood pressure goal: 130/80  (11/11/2009)     Personal LDL goal: 100  (11/11/2009)    Patient will work on the following items until the next clinic visit to reach self-care goals:     Medications and monitoring: take my medicines every day, examine my feet every day  (11/11/2009)     Eating: drink diet soda or water instead of juice or soda, eat  foods that are low in salt, eat baked foods instead of fried foods  (11/11/2009)    Diabetes self-management support: Resources for patients handout, Written self-care plan  (11/11/2009)   Diabetes care plan printed    Hypertension self-management support: Resources for patients handout, Written self-care plan  (11/11/2009)   Hypertension self-care plan printed.    Lipid self-management support: Resources for patients handout, Written self-care plan  (11/11/2009)   Lipid self-care plan printed.      Resource handout printed.   Nursing Instructions: Diabetic foot exam today Schedule screening mammogram (see order)   Process Orders Check Orders Results:     [facility]: ABN not required for this insurance Tests Sent for requisitioning (November 13, 2009 12:40 PM):     11/12/2009: [facility] -- T-CBC No Diff [[id number]] (signed)     11/12/2009: [facility] -- T-CMP with Estimated GFR [[id number]] (signed)     11/12/2009: [facility] -- T-Lipid Profile [phone] (signed)     11/12/2009: [facility] -- T-TSH [phone] (signed)    Laboratory Results   Blood Tests   Date/Time Received: TO: Facility Name: To the Attention of: Facsimile# Telephone#  List of Information Sent:  Date/Time Reported: Alric Quan  November 11, 2009 4:15 PM   HGBA1C: 9.0%   (Normal Range: Non-Diabetic - 3-6%   Control Diabetic - 6-8%) CBG Random:: 209mg /dL    Flu Vaccine Consent Questions     Do you have a history of severe allergic reactions to this vaccine? no    Any prior history of allergic reactions to egg and/or gelatin? no    Do you have a sensitivity to the preservative Thimersol? no    Do you have a past history of Guillan-Barre Syndrome? no    Do you currently have an acute febrile illness? no    Have you ever had a severe reaction to latex? no    Vaccine information given and explained to  patient? yes    Are you currently pregnant? no    Lot Number:[id number]   Exp Date:08/07/2010   Manufacturer: [employer]    Site Given right Deltoid IM.Cynda Familia (AAMA)  November 11, 2009 4:53 PM    HGBA1C: 9.0%   (Normal Range: Non-Diabetic - 3-6%   Control Diabetic - 6-8%) CBG Random:: 209mg /dL    .opcflu

## 2010-03-09 NOTE — Progress Notes (Signed)
Summary: Medications  Phone Note Call from Patient   Caller: Patient Call For: Hartley Barefoot MD Summary of Call: Call from pt wants to see if she can get samples of her medication.  Is in the ER with her daughter and did not bring in her medications.  Pt came in the ambulance and cannot get to her meds.   Pt needs at least 2 to 3 days.   Pt can be reached at ER/CDU. Angelina Ok RN  February 17, 2009 9:08 AM  Initial call taken by: Angelina Ok RN,  February 17, 2009 9:08 AM  Follow-up for Phone Call        Patient is on all 4$ meds. We don't have samples. The glipizide and her BP meds are most important. The only suggestion I have is to have her see Rudell Cobb, but the [employer] is runnning low. The ED could possibly access hospital resources. Otherwise I have no other suggestions. Follow-up by: Julaine Fusi  DO,  February 17, 2009 10:17 AM  Additional Follow-up for Phone Call Additional follow up Details #1::        RTC from pt given option to talk with the SW in the ED as we do not have access to samples for the pt.  Pt said she would ask the nurse to direct her to the SW. Additional Follow-up by: Angelina Ok RN,  February 17, 2009 11:01 AM

## 2010-03-09 NOTE — Progress Notes (Signed)
Summary: refill/ hla  Phone Note Refill Request Message from:  Patient on September 15, 2009 2:52 PM  Refills Requested: Medication #1:  GLIPIZIDE 5 MG  TABS Take 2  tablet by mouth in the morning and one tablet at night.   Dosage confirmed as above?Dosage Confirmed pt was notified after last refill that she had to be seen, she states she was not planning on keeping the appt this week due to other things going on but wanted the med refilled, she was offered an appt this pm and refused, states she will go without and maybe come for appt on the 11th  Initial call taken by: Marin Roberts RN,  September 15, 2009 2:54 PM  Follow-up for Phone Call        Noted, patient needs to be seen for [place] to take care of her. Follow-up by: Zoila Shutter MD,  September 15, 2009 3:17 PM  Additional Follow-up for Phone Call Additional follow up Details #1::        pt called this am and wanted her meds, stated she should not have to come in to clinic to get meds filled, she states she has issues going on and will not come for appt, she is informed that dr Coralee Pesa states she must be seen to be prescribed meds. i scheduled an appt on the day and time she said would work but she also added she may not come to that appt.  Additional Follow-up by: Marin Roberts RN,  September 18, 2009 10:25 AM    Additional Follow-up for Phone Call Additional follow up Details #2::    Noted. Follow-up by: Zoila Shutter MD,  September 18, 2009 12:20 PM

## 2010-03-19 ENCOUNTER — Other Ambulatory Visit: Payer: Self-pay | Admitting: Internal Medicine

## 2010-03-22 NOTE — Telephone Encounter (Signed)
Note sent to scheduling for appointment

## 2010-03-30 ENCOUNTER — Other Ambulatory Visit: Payer: Self-pay | Admitting: Internal Medicine

## 2010-04-22 ENCOUNTER — Encounter: Payer: Self-pay | Admitting: Internal Medicine

## 2010-05-18 LAB — CBC
HCT: 26.7 % — ABNORMAL LOW (ref 36.0–46.0)
Hemoglobin: 9.3 g/dL — ABNORMAL LOW (ref 12.0–15.0)
MCHC: 34.8 g/dL (ref 30.0–36.0)
Platelets: 251 10*3/uL (ref 150–400)
RDW: 13.6 % (ref 11.5–15.5)

## 2010-05-18 LAB — BASIC METABOLIC PANEL
BUN: 32 mg/dL — ABNORMAL HIGH (ref 6–23)
CO2: 27 mEq/L (ref 19–32)
Glucose, Bld: 247 mg/dL — ABNORMAL HIGH (ref 70–99)
Potassium: 3.7 mEq/L (ref 3.5–5.1)
Sodium: 131 mEq/L — ABNORMAL LOW (ref 135–145)

## 2010-05-18 LAB — DIFFERENTIAL
Basophils Absolute: 0 10*3/uL (ref 0.0–0.1)
Basophils Relative: 0 % (ref 0–1)
Eosinophils Absolute: 0.1 10*3/uL (ref 0.0–0.7)
Eosinophils Relative: 1 % (ref 0–5)
Monocytes Absolute: 0.5 10*3/uL (ref 0.1–1.0)

## 2010-05-18 LAB — GLUCOSE, CAPILLARY: Glucose-Capillary: 162 mg/dL — ABNORMAL HIGH (ref 70–99)

## 2010-06-09 ENCOUNTER — Other Ambulatory Visit: Payer: Self-pay | Admitting: Internal Medicine

## 2010-06-10 NOTE — Telephone Encounter (Signed)
All the phone numbers I have for the patient are disconnected. I called the pharmacist and she will inform pt when she picks up her meds.

## 2010-06-10 NOTE — Telephone Encounter (Signed)
[name], please call this pt and let her know that only 1 month supply of her medication has been refilled as she has not been seen in clinic in 6 months, although previously recommended 3 month follow-up, and during last visit, she had not been compliant with diabetes medications due to personal hardship. Therefore, she needs to have close follow-up so as to monitor control of her diabetes, we need labs, etc.   Thank you.  [name], D.O.

## 2010-06-10 NOTE — Telephone Encounter (Signed)
Thank you!  [name], D.O.

## 2010-06-22 NOTE — Group Therapy Note (Signed)
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  She was seen by [place] up  until 2007 and treated for her dysfunctional uterine bleeding but did  not want a hysterectomy at that time.  We had put her on oral  contraceptives.  Apparently, that took care of the problem because she  had no problems until recently.  She said she went a year and a half  without any bleeding, and then had a period, went into the emergency  room as she was concerned.  They sent her for an ultrasound which showed  a uterus was consistent with a bicornuate or septate uterus with  endometrial stripe on both sides measuring 0.4 cm.  No sign of any  fibroids.  The left ovary looked fine.  The right ovary could not be  visualized.  She has not had a Pap smear since she had been in 3 years  ago.  So we decided to do that.  She is perimenopausal at the time.  I  am going to get an [facility] and see if she truly is in menopause.  She is only had one episode.  The bleeding has stopped.  It was like a  period.  She has no further bleeding.  I am going to continue to observe  her.  The endometrial stripe certainly has got a good report at a 0.4-  cm.  I think that we can afford to wait and see if she has recurrence  without doing an endometrial biopsy.  We did want her actually back in  2005 which she said was negative.  I am going to put her on some iron,  and we did a Pap smear today.   PHYSICAL EXAMINATION:  BREASTS:  Symmetrical, no dominant masses.  No  nipple  discharge.  No axillary supraclavicular nodes.  ABDOMEN:  Soft, flat, nontender.  No masses or organomegaly.  External  genitalia is normal.  BUS within normal limits.  Vagina is clean and  well rugated.  Cervix is clean and parous.  The uterus could not be well  outlined.  The adnexa could not be palpated.  The patient weighs 161  pounds and is 5 feet 2 inches tall.   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  Will have the  patient come back in 2 weeks to check on her [facility] and [facility] and talked to  her about follow-up after that.  Pap smear was taken.           ______________________________  Argentina Donovan, MD     PR/MEDQ  D:  08/07/2008  T:  08/07/2008  Job:  [id number]

## 2010-06-25 NOTE — Group Therapy Note (Signed)
NAMEALEXINE, PILANT NO.:  [id number]   MEDICAL RECORD NO.:  [id number]          PATIENT TYPE:  WOC   LOCATION[facility]                   [facility]ACILITY[facility]  [facility]   PHYSICIAN:  Ellis Parents, MD    DATE OF BIRTH:  15-Nov-1955   DATE OF SERVICE:  04/20/2004                                    CLINIC NOTE   The patient has returned to clinic for follow-up of a Bartholin's abscess  that was drained and a Word catheter was placed approximately three weeks  ago on March 30, 2004.  The patient is not complaining of any pain,  fever, tenderness.  She states that the catheter is merely inconvenient.   PHYSICAL EXAMINATION:  The catheter is in place.  There are no areas of  induration, tenderness, erythema.   PLAN:  The patient is to return in two weeks for removal of the catheter.   DICTATED BY:  Tobie Lords.      SA/MEDQ  D:  04/20/2004  T:  04/20/2004  Job:  [id number]

## 2010-06-25 NOTE — Group Therapy Note (Signed)
[name], [name] NO.:  [id number]   MEDICAL RECORD NO.:  [id number]          PATIENT TYPE:  WOC   [facility]ATION[facility]                   [facility]   PHYSICIAN:  Argentina Donovan, MD        DATE OF BIRTH:  1955-07-11   DATE OF SERVICE:  01/04/2006                                  CLINIC NOTE   CHIEF COMPLAINT:  Follow-up from hospitalization for menometrorrhagia.   HISTORY OF PRESENT ILLNESS:  The patient is a 56 year old white female  with past medical history significant for diabetes, dysfunctional  uterine bleeding, anemia and hypertension, who presents today for follow-  up on a hospitalization on December 27, 2005, for anemia.  The patient  has a history of menorrhagia leading to significant anemia with brief  fainting episodes.  The patient was hospitalized at [facility] by the  internal medicine teaching service.  There she received 1 unit of packed  red blood cells.  Her hemoglobin on admission was 8.5, her hemoglobin on  discharge was 8.4.  Per patient's report, she was sent over here for  follow-up.  The patient states she has been bleeding daily since December 06, 2005, up until January 03, 2006.  She continues to use 2 pads per  day.  She has been on iron supplementation of 325 mg p.o. t.i.d.  She  recently had her hemoglobin checked at the inpatient clinic on Monday of  this week.  She does not know her results yet.  The patient states she  does not want a hysterectomy, she just wants to have some control of her  bleeding.  She does not smoke.  She has never had a blood clot.  She  does not have migraines.   REVIEW OF SYSTEMS:  Pretty much negative.   PHYSICAL EXAMINATION:  VITAL SIGNS:  Blood pressure was 147/84, weight  was 167.4 pounds.  Pulse was 85.  GENERAL:  She is in no acute distress, a pleasant-appearing white  female.  HEENT:  Normocephalic, atraumatic.  Extraocular muscles are intact.  Moist mucous membranes.  ABDOMEN:  Soft,  nontender, nondistended, positive bowel sounds, no  tenderness to palpation.  GENITOURINARY:  Deferred today secondary to having a pelvic ultrasound  dated December 27, 2005, which showed a septate versus arcuate uterus  and no fibroids noted, endometrial stripe was within normal limits, and  there was a 3.6 simple cyst on the left ovary.  Otherwise, everything  was normal.   ASSESSMENT AND PLAN:  A 55 year old with dysfunctional uterine bleeding.   1. Dysfunctional uterine bleeding.  Since the patient does not want a      hysterectomy at this time, we will go ahead and start her on Ortho-      Novum 1/35.  She recently completed her period yesterday, so we      will go ahead and start her on these today.  She is to take the      package as directed.  This should help to reduce her length of      menses as well as decrease her amount of  blood loss.  She is to      continue her iron supplementation.  The patient will be followed      back up in 3 months.  At that time if she would like, she may      discontinue her oral contraceptive and further evaluate for      perimenopausal.  I do believe this is presumptive for      perimenopausal-type symptoms.  2. Anemia.  This will be followed by the outpatient clinic since they      have been checking her hemoglobin and prescribed her iron therapy.  3. Hypertension.  This is followed by the outpatient clinic.  4. Diabetes.  This is followed by outpatient clinic.   FOLLOW-UP:  The patient is to follow up in 3 months here at the [facility].   Thank you for referring this patient.     ______________________________  Barth Kirks, M.D.    ______________________________  Argentina Donovan, MD    MB/MEDQ  D:  01/04/2006  T:  01/05/2006  Job:  [id number]   cc:   [facility]

## 2010-06-25 NOTE — Group Therapy Note (Signed)
[name], Burns NO.:  [id number]   MEDICAL RECORD NO.:  [id number]          PATIENT TYPE:  WOC   [facility]ATION[facility]                   [facility]   PHYSICIAN:  Elsie Lincoln, MD      DATE OF BIRTH:  1955-02-14   DATE OF SERVICE:                                    CLINIC NOTE   HISTORY OF PRESENT ILLNESS:  The patient is a 55 year old G3, para 1-1-1-1,  LMP July 23, 2004, who presents for an annual exam.  She has no complaints.  She has normal monthly menses.  No hot flashes.  No leakage of urine.  No  pelvic pain.   PAST MEDICAL HISTORY:  Significant for hypertension, diabetes and high  cholesterol.   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   GYNECOLOGICAL HISTORY:  NSVD x 2 and a D&C x 1.  Denies fibroids.  She has a  history of a Bartholin's gland abscess that had a Word catheter placed this  past year, and doing well with that.  No history of STDs, and normal  mammogram per the patient, and we do have a copy of that mammogram in June  of 2005.  She said she has had another one recently, and we need to find  that exam result.   MEDICATIONS:  1.  Zocor.  2.  Glipizide.  3.  Benazepril.   ALLERGIES:  None.   REVIEW OF SYSTEMS:  No chest pain, shortness of breath, nausea, vomiting,  diarrhea or change in bladder habits.   PHYSICAL EXAMINATION:  ABDOMEN:  Soft, nontender, nondistended.  GENITALIA:  Tanner 5.  There is a small residual Bartholin's cyst that does  not appear infected, and is nontender.  Vagina pink.  Normal rugae.  Cervix:  Large nabothian cyst anteriorly.  She was biopsied last year, and it did  show up as a Bartholin's cyst.  She has a normal Pap smear from last year.  The uterus is nontender.  Adnexa nontender.  No masses.   ASSESSMENT AND PLAN:  1.  A 56 year old female.  The patient refused breast exam because she had a      normal mammogram.  I explained to her that this does  preclude a breast      exam, but she does not want one today.  2.  Nabothian cyst.  3.  Pap smear and cultures done today.  4.  Look up mammogram results.       KL/MEDQ  D:  08/03/2004  T:  08/03/2004  Job:  [id number]

## 2010-06-25 NOTE — Discharge Summary (Signed)
NAMEGAYNEL, SCHAAFSMA NO.:  [id number]   MEDICAL RECORD NO.:  [id number]          PATIENT TYPE:  INP   LOCATION:  4728                         FACILITY:  [facility]   PHYSICIAN:  Yvonne Kendall, M.D.  DATE OF BIRTH:  1955/03/23   DATE OF ADMISSION:  12/27/2005  DATE OF DISCHARGE:  12/28/2005                                 DISCHARGE SUMMARY   DISCHARGE DIAGNOSES:  1. Menometrorrhagia.  2. Anemia secondary to vaginal bleeding.  3. Type 2 diabetes.  4. Hypertension.  5. Hyperlipidemia.  6. Physiologic left ovarian cyst measuring 3.6 cm.  7. History of Bartholin and nabothian cysts.  8. 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   DISCHARGE MEDICATIONS:  1. Tylenol as needed for pain.  2. Glipizide 5 mg p.o. daily.  3. Pravachol 40 mg p.o. daily.  4. Ferrous sulfate 325 mg p.o. t.i.d.  5. Colace 100 mg p.o. b.i.d.   DISPOSITION AND FOLLOWUP:  At the time of discharge, the patient's  hemoglobin was stable and she was not complaining of any chest pain,  shortness of breath, or lightheadedness.  Orthostatic vital signs were  within normal limits.  The patient is scheduled for followup in the  outpatient clinic on Monday, January 02, 2006, at 2:30 p.m., at which time,  she will have a stat CBC drawn to ensure that she is not continuing to  bleed.  The patient is also scheduled for a followup appointment with Dr.  Okey Dupre or Dr. Perlie Gold at the [facility] on January 04, 2006, at 3 p.m.  for further evaluation of her vaginal bleeding.  Finally, Ms. Ravan will  be seen by Dr. Lyda Perone, her primary care physician, on January 16, 2006, at 2  p.m. for further management of her chronic medical issues.   PROCEDURES PERFORMED:  1. Transabdominal and transvaginal pelvic ultrasound:  This was performed      on December 27, 2005, and revealed a septate versus arcuate uterus.      Both endometrial stripes are within normal limits  in width.  The      myometrial echotexture is slightly heterogeneous, which may be related      to an element of adenomyosis.  No focal fibroids are noted.  A 3.6-cm      simple cyst in the left ovary, likely physiologic, is also seen.  2. Chest x-ray:  This was performed on December 27, 2005, and revealed      accentuated lung markings, likely related to chronic interstitial      change.   CONSULTATIONS:  None.   BRIEF ADMITTING HISTORY AND PHYSICAL:  Ms. Leyland is a 55 year old female  with a past medical history of type 2 diabetes, hypertension,  hyperlipidemia, and a prior episode of menorrhagia leading to significant  anemia with a brief fainting episode.  She presented to the emergency room  complaining of worsening fatigue and dyspnea on exertion, as well as vaginal  bleeding.  She reports that she has had regular periods for the last 6  months up  until this October.  In the latter portion of that month, she  began having a period which has continued with daily menstrual flow. up to  now  She reports using 2 sanitary pads per day, which is an increase above  her baseline.  The patient also endorses having hot flashes, vague lower  abdominal pain, ice pica, and pallor.  She denies having chest pain, nausea,  vomiting, fevers, and chills.   PHYSICAL EXAMINATION:  VITAL SIGNS:  Temperature 97.6, blood pressure  127/70, pulse 96, respirations 20, oxygen saturation 99% on room air.  GENERAL:  The patient is a well-developed, well-nourished lady in no acute  distress.  EYES:  Pupils are equal, round, reactive to light and accommodation.  Extraocular eye movements are intact.  Conjunctivae are pale.  HEENT:  Moist mucous membranes and a clear oropharynx are noted.  NECK:  Supple without lymphadenopathy or thyromegaly.  RESPIRATORY:  Lungs are clear to auscultation bilaterally.  CARDIOVASCULAR:  A regular rate and rhythm with a 2/6 systolic ejection  murmur heard loudest at the right  upper sternal border.  ABDOMEN:  Normoactive bowel sounds are present.  Abdomen is soft with mild  left lower quadrant tenderness.  It is not distended.  Normal rectal tone  with a small amount of firm stool noted in the rectal vault.  The patient  does appear to have external hemorrhoids.  EXTREMITIES:  No lower extremity edema noted.  The patient has 2+ radial,  posterior tibial and dorsalis pedis pulses bilaterally.  GU:  The patient is found to have normal external female genitalia with pink  vaginal mucosa.  A copious amount of blood is noted in the vaginal vault and  cervix with several clots.  No cervical motion tenderness is noted.  SKIN:  The patient has moist skin with multiple benign-appearing nevi.  LYMPH:  The patient has no cervical, supraclavicular or axillary  lymphadenopathy.  MUSCULOSKELETAL:  The patient is moving upper and lower extremities normally  bilaterally.  NEURO:  The patient's cranial nerves are grossly intact.  The remainder of  the exam is nonfocal.  PSYCH:  The patient is alert and oriented with an appropriate mood and  affect.   ADMISSION LABORATORIES:  Hemoglobin 8.5, hematocrit 25, white blood cell  count 7.7, platelets 249.  Prothrombin time 14.3, INR 1.1, D-dimer 0.24.  Sodium 137, potassium 3.7, chloride 105, bicarbonate 30, BUN 25, creatinine  1.2, total bilirubin 0.8, alkaline phosphatase 73, AST is 32, ALT 33, total  protein 5.8, albumin 3.2, calcium 8.3.  LDH 90.  TSH 2.988.  Total iron 70,  total iron-binding capacity 353.  Vitamin B12 348, serum folate 16.0,  ferritin 3.  Quantitative beta-HCG less than 3.  Haptoglobin 124.  Urinalysis:  Large blood, 100 protein, small leukocytes, few bacteria, most  likely with contamination from vaginal bleeding.   HOSPITAL COURSE:  1. Menometrorrhagia:  Based on Ms. Barrales's history and physical exam,     she appears to have abnormal vaginal bleeding both in intensity and      duration.  At this time,  the exact etiology is unknown.  The pelvic      ultrasound performed at the time of admission was largely unremarkable      in regard to her endometrium.  However, following admission, the      patient's bleeding decreased considerably, and she did not complain of      any significant vaginal bleeding at the time of discharge.  She  has      been referred to the [facility] for further evaluation and      management of this problem.  She was advised to call the clinic or      return to the hospital if she has any additional bleeding episodes      before she can be seen for followup.  2. Anemia:  Ms. Begeman dsypnea and progressive fatigue were felt to be      secondary to anemia from menometrorrhagia.  Nonetheless, a complete      anemia panel was performed and was remarkable only for a significantly      decreased ferritin level consistent with iron deficiency anemia.  An      EKG was obtained, which revealed a possible right bundle branch block      that was stable from prior studies and no acute findings suggestive of      ischemia.  Cardiac markers were cycled and also revealed no myocardial      ischemia.  The patient received 1 unit of packed red blood cells while      in the emergency room because of dyspnea felt to be secondary to      anemia.  Following infusion, her dyspnea improved and remained absent      for the duration of her admission and her hemoglobin remained stable at      8.3.  It had increased to 8.6 at the time of discharge.  The patient      was also hydrated with normal saline and did not display any      orthostatic hypotension at the time of discharge.  She is to follow up      in the outpatient clinic on January 02, 2006, for a CBC to ensure that      her anemia has not worsened.  She has also been instructed to take      ferrous sulfate 3 times a day.  3. Hypertension:  The patient has a history of hypertension, although her      blood pressure was found  to be in the normal range on admission.      Because of her active bleeding, she was not restarted on her ACE      inhibitor.  She should refrain from taking this medicine until she can      be evaluated by Dr. Lyda Perone in the outpatient clinic.  4. Type 2 diabetes:  The patient has a history of well-controlled type 2      diabetes with a most recent hemoglobin A1c of 6.4 earlier this month.      She is currently taking glipizide at home and was continued on her home      dose of 5 mg daily.  5. Hyperlipidemia:  Ms. Pitner has a history of hyperlipidemia.  She is      to continue taking pravastatin as directed by Dr. Lyda Perone.   DISCHARGE LABORATORY:  White blood cell count 7.1, hemoglobin of 8.6,  hematocrit 25.6, platelets 256.   DISCHARGE VITALS:  Temperature 97.2, pulse 86, blood pressure 111/71,  respirations 20, oxygen saturation 98% on room air.      Yvonne Kendall, M.D. Electronically Signed     CE/MEDQ  D:  12/28/2005  T:  12/29/2005  Job:  [id number]   cc:   Javier Glazier. Okey Dupre, M.D.  Manning Charity, MD

## 2010-06-25 NOTE — Group Therapy Note (Signed)
[name] NO.:  [id number]   MEDICAL RECORD NO.:  [id number]          PATIENT TYPE:  WOC   [facility]ATION[facility]                   [facility]ACILITY:  [facility]   PHYSICIAN:  Jon Gills, MD    DATE OF BIRTH:  April 23, 1955   DATE OF SERVICE:  03/30/2004                                    CLINIC NOTE   CHIEF COMPLAINT:  Possible Bartholin's cyst.   SUBJECTIVE:  [name] is here today for a Bartholin's cyst.  She states  it has recurred on her left labia.  Denies any drainage or fevers.  She does  have a history of own back in May of 2005 which ruptured spontaneously and  she was never treated for it.  She currently has no other complaints except  for some mild discomfort in the vagina.  She is not sexually active and  having no GU or GI changes from these cysts.   PAST MEDICAL HISTORY:  Diabetes mellitus on oral medications.   OBJECTIVE:  VITAL SIGNS:  Noted.  GENERAL:  She is an elderly-appearing female in no acute distress.  PELVIC:  Normal external female genitalia.  Normal urethra and urethral  meatus.  Left labia is larger than the right side.  There is a fluctuant 4 x  3 cm abscess in the left Bartholin gland.   PROCEDURE:  Patient was prepped with alcohol swabs.  2 mL of plain lidocaine  was injected superficially.  A #11 blade was used to make a small incision  in the cyst with copious purulent drainage.  A Ward catheter was easily  placed with minimal blood loss.  The catheter was filled with 3 mL of normal  saline.  With firm pressure the catheter stayed in place without falling  out.  Patient had no complications.   ASSESSMENT/PLAN:  Bartholin's abscess.  The abscess was I&Dd as stated  above.  She was given a handout from the [employer] website on proper care of the  Bartholin's cyst.  She can take ibuprofen or Aleve or Tylenol for pain.  I  did recommend twice daily sitz baths.  I did instruct her on how to remove  the catheter if the pain becomes  too severe and told that she would have  some discomfort as the area healed around the catheter.  She should come  back in around three weeks for removal of the catheter and I told her to try  her best to not have to take it out.  If it falls out she should just return  in three weeks for follow-up.      LC/MEDQ  D:  03/30/2004  T:  03/31/2004  Job:  [id number]

## 2010-08-17 ENCOUNTER — Other Ambulatory Visit: Payer: Self-pay | Admitting: Internal Medicine

## 2010-08-17 NOTE — Telephone Encounter (Signed)
Message sent to [employer] for an appt.

## 2010-08-17 NOTE — Telephone Encounter (Signed)
I am only sending a 1 month supply, she  Needs to come in for an appt. Her last appt was in sept 2011.  [name], D.O.

## 2010-08-19 ENCOUNTER — Other Ambulatory Visit: Payer: Self-pay | Admitting: Internal Medicine

## 2010-08-20 NOTE — Telephone Encounter (Signed)
I will only fill for a 1 month, she needs to come in for an appt.  [name], D.O.

## 2010-09-02 ENCOUNTER — Encounter: Payer: Self-pay | Admitting: Internal Medicine

## 2010-09-10 ENCOUNTER — Encounter: Payer: Self-pay | Admitting: Internal Medicine

## 2010-09-16 ENCOUNTER — Other Ambulatory Visit: Payer: Self-pay | Admitting: Internal Medicine

## 2010-09-16 DIAGNOSIS — E785 Hyperlipidemia, unspecified: Secondary | ICD-10-CM

## 2010-09-16 NOTE — Telephone Encounter (Signed)
Patient needs to come in for appt, she has not have eval of her liver function tests since 2010. Please inform her she must come in, I will give only one month supply.  [name], D.O.

## 2010-09-16 NOTE — Telephone Encounter (Signed)
One month only, no more refills till she comes in.  [name], D.O.

## 2010-09-16 NOTE — Telephone Encounter (Signed)
Unable to reach pt at her numbers in the chart.  Message left at [facility] to have patient call for an appointment prior to further refills.

## 2010-09-16 NOTE — Telephone Encounter (Signed)
I am only approving 1 month supply, this patient has multiple missed appts and has had no labs since 2010 looking at her renal function - please inform the patient of this.   [name], D.O.

## 2010-09-17 ENCOUNTER — Encounter: Payer: Self-pay | Admitting: Internal Medicine

## 2010-09-21 ENCOUNTER — Encounter: Payer: Self-pay | Admitting: Internal Medicine

## 2010-09-21 ENCOUNTER — Ambulatory Visit (INDEPENDENT_AMBULATORY_CARE_PROVIDER_SITE_OTHER): Payer: Self-pay | Admitting: Internal Medicine

## 2010-09-21 DIAGNOSIS — E119 Type 2 diabetes mellitus without complications: Secondary | ICD-10-CM

## 2010-09-21 DIAGNOSIS — E785 Hyperlipidemia, unspecified: Secondary | ICD-10-CM

## 2010-09-21 DIAGNOSIS — I10 Essential (primary) hypertension: Secondary | ICD-10-CM

## 2010-09-21 LAB — GLUCOSE, CAPILLARY: Glucose-Capillary: 345 mg/dL — ABNORMAL HIGH (ref 70–99)

## 2010-09-21 NOTE — Patient Instructions (Addendum)
Please follow-up at the clinic in 1 month with me, at which time we will reevaluate your diabetes, hypertension, cholesterol.  Please follow-up in the clinic sooner if needed.  Follow-up with Rudell Cobb ASAP so she can get you qualified for the orange card.  Come back in 1-2 weeks for lab visit for cholesterol, kidney check - be fasting for this visit (no food or drink for 8 hours prior to the visit)  If you start exercising and get chest pain, please stop and come for evaluation.  Diabetes  Work on losing weight - stop if you develop chest pain  Check your blood sugars 2-3 times weekly before breakfast  You can get the Relion glucometer at [facility], the test strips are cheaper and you can get both over the counter  If you are diabetic, please bring your meter to your next visit.  Hypertension  Please work on weight loss  I have attached some information about low salt diet   If you have been started on new medication(s), and you develop throat closing, tongue swelling, rash, please stop the medication and call the clinic at [phone] and go to the ER.  If symptoms worsen, or new symptoms arise, please call the clinic or go to the ER.  Please bring all of your medications in a bag to your next visit.    Sodium-Controlled Diet Sodium is a mineral. It is found in many foods. Sodium may be found naturally or added during the making of a food. The most common form of sodium is salt, which is made up of sodium and chloride. Reducing your sodium intake involves changing your eating habits. The following guidelines will help you reduce the sodium in your diet:  Stop using the salt shaker.   Use salt sparingly in cooking and baking.   Substitute with sodium-free seasonings and spices.   Do not use a salt substitute (potassium chloride) without your caregiver's permission.   Include a variety of fresh, unprocessed foods in your diet.   Limit the use of processed and  convenience foods that are high in sodium.  USE THE FOLLOWING FOODS SPARINGLY: Breads/Starches  Commercial bread stuffing, commercial pancake or waffle mixes, coating mixes. Waffles. Croutons. Prepared (boxed or frozen) potato, rice, or noodle mixes that contain salt or sodium. Salted [place] fries or hash browns. Salted popcorn, breads, crackers, chips, or snack foods.  Vegetables  Vegetables canned with salt or prepared in cream, butter, or cheese sauces. Sauerkraut. Tomato or vegetable juices canned with salt.   Fresh vegetables are allowed if rinsed thoroughly.  Fruit  Fruit is okay to eat.  Meat and Meat Substitutes  Salted or smoked meats, such as bacon or Canadian bacon, chipped or corned beef, hot dogs, salt pork, luncheon meats, pastrami, ham, or sausage. Canned or smoked fish, poultry, or meat. Processed cheese or cheese spreads, blue or Roquefort cheese. Battered or frozen fish products. Prepared spaghetti sauce. Baked beans. Reuben sandwiches. Salted nuts. Caviar.  Milk  Limit buttermilk to 1 cup per week.  Soups and Combination Foods  Bouillon cubes, canned or dried soups, broth, consomm. Convenience (frozen or packaged) dinners with more than 600 milligrams (mg) sodium. Pot pies, pizza, Asian food, fast food cheeseburgers, and specialty sandwiches.  Desserts and Sweets  Regular (salted) desserts, pie, commercial fruit snack pies, commercial snack cakes, canned puddings.   Eat desserts and sweets in moderation.  Fats and Oils  Gravy mixes or canned gravy. No more than 1 to 2  tablespoons of salad dressing. Chip dips.   Eat fats and oils in moderation.  Beverages  See those listed under the vegetables and milk groups.  Condiments/Miscellaneous  Ketchup, mustard, meat sauces, salsa, regular (salted) and lite soy sauce or mustard. Dill pickles, olives, meat tenderizer. Prepared horseradish or pickle relish. Dutch-processed cocoa. Baking powder or baking soda used  medicinally. Worcestershire sauce. "Light" salt. Salt substitute, unless approved by your caregiver.  Document Released: 07/16/2001 Document Re-Released: 04/20/2009 [facility] Patient Information 2011 [place], [place].Marland Kitchen

## 2010-09-21 NOTE — Assessment & Plan Note (Addendum)
Lab Results  Component Value Date   HGBA1C 9.0 11/11/2009   HGBA1C 8.8 12/24/2008   HGBA1C 6.7 06/16/2008   Lab Results  Component Value Date   MICROALBUR 0.53 07/17/2007   LDLCALC 56 12/11/2007   CREATININE 1.44* 12/24/2008    Assessment:  Disease Control: not controlled  Progress toward goals: deteriorated  Barriers to meeting goals: financial need and pt is not able to take care of herself due to the need for care of her daughter who is disabled.   Plan: Glucometer log was not reviewed today.  Diabetes treatment:    - continue current medications, can consider escalation of GLipizide dose to 20 mg daily, although may not be more effective than her current dosage. Will avoid metformin because of CKD - Will try to get approved through [facility] for orange card - can consider januvia in future if pt continues to refuse insulin. - reminded to bring blood glucose meter & log to each visit, reminded to bring medications to each visit, discussed the need for weight loss and discussed diet

## 2010-09-21 NOTE — Assessment & Plan Note (Signed)
BP Readings from Last 3 Encounters:  09/21/10 130/88  11/11/09 113/76  12/24/08 140/96    Basic Metabolic Panel:    Component Value Date/Time   NA 138 12/24/2008 2022   K 4.2 12/24/2008 2022   CL 99 12/24/2008 2022   CO2 25 12/24/2008 2022   BUN 39* 12/24/2008 2022   CREATININE 1.44* 12/24/2008 2022   GLUCOSE 136* 12/24/2008 2022   CALCIUM 9.7 12/24/2008 2022    Assessment: Hypertension Control: controlled  Progress toward goals: unchanged  Barriers to meeting goals: no barriers identified   Plan: Hypertension treatment:   - continue current medications - Is borderline controlled, will need to reassess next visit and consider escalation of therapy if indicated. - Pt intends to work on weight loss

## 2010-09-21 NOTE — Progress Notes (Signed)
  Subjective:    Patient ID: [name], female    DOB: 03-Dec-1955, 55 y.o.   MRN: [id number]  HPI Pt is a 55 y.o. female who has a PMHx of DMII, HLD, HTN, chronic renal insufficiency and presents to clinic today for the following:   1) DM - last A1c 9 (11/2009). Patient is currently not checking blood sugars because of cost of strips. Currently taking glipizide (Glucotrol) 10 mg in AM and 5 mg in PM. Unknown hypoglycemic episodes since last visit, as pt is not checking blood sugars. Admits to polyuria. Denies polydipsia, nausea, vomiting, diarrhea. does request refills today.  2) HTN - Patient does not check blood pressure regularly at home. Currently taking enalapril-hydrochlorothiazide regularly - last dose was this AM. denies headaches, dizziness, lightheadedness, shortness of breath.  does request refills today.   3) HLD - currently taking Pravachol (pravastatin).  denies chest pain, difficulty breathing, palpitations, tachycardia, and muscle pains. does request refills today. Is not fasting today.    Review of Systems Per HPI.  Current Outpatient Medications Medication Sig  . Enalapril-Hydrochlorothiazide 5-12.5 MG per tablet TAKE 1 TABLET BY MOUTH ONCE DAILY  . glipiZIDE (GLUCOTROL) 5 MG tablet TAKE 2 TABLETS BY MOUTH IN THE MORNING AND ONE TABLET AT NIGHT.  Marland Kitchen pravastatin (PRAVACHOL) 40 MG tablet TAKE ONE TABLET BY MOUTH DAILY    Allergies No Known Allergies   Past Medical History  Diagnosis Date  . Diabetes mellitus type II, uncontrolled   . Hypertension   . Hyperlipidemia   . Ovarian cyst, left   . Anemia     due to menorrhagia, BL 8-10  . Vaginal cyst     nabothian and bartholin  . Chronic kidney disease     baseline creatinine 1.4-1.7    Past Surgical History  Procedure Date  . 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 Well-developed, well-nourished, in no acute distress; alert, appropriate and cooperative throughout examination.  Head: Normocephalic, atraumatic.  Lungs:  Normal respiratory effort. Clear to auscultation BL without crackles or wheezes.  Heart: RRR. S1 and S2 normal without gallop, or rubs. (+) murmur.  Abdomen:  BS normoactive. Soft, Nondistended, non-tender.  No masses or organomegaly.  Extremities: No pretibial edema.        Assessment & Plan:  Case and plan of care discussed with Dr. Doneen Poisson.

## 2010-09-21 NOTE — Assessment & Plan Note (Signed)
Liver Function Tests:    Component Value Date/Time   AST 33 12/24/2008 2022   ALT 33 12/24/2008 2022   ALKPHOS 125* 12/24/2008 2022   BILITOT 0.4 12/24/2008 2022   PROT 7.6 12/24/2008 2022   ALBUMIN 4.6 12/24/2008 2022    Lipid Panel:     Component Value Date/Time   CHOL 137 12/11/2007 2011   TRIG 190* 12/11/2007 2011   HDL 43 12/11/2007 2011   CHOLHDL 3.2 Ratio 12/11/2007 2011   VLDL 38 12/11/2007 2011   LDLCALC 56 12/11/2007 2011     Assessment: Lipid Control: controlled previously, but last recheck was in 2009.   Progress toward goals: unable to assess  Barriers to meeting goals: financial need   Plan: Lipid treatment:   - Continue current medications - Will return in 1-2 weeks for lab visit for FLP and CMET

## 2010-10-14 ENCOUNTER — Other Ambulatory Visit: Payer: Self-pay | Admitting: Internal Medicine

## 2010-10-14 DIAGNOSIS — E1165 Type 2 diabetes mellitus with hyperglycemia: Secondary | ICD-10-CM

## 2010-10-14 DIAGNOSIS — IMO0002 Reserved for concepts with insufficient information to code with codable children: Secondary | ICD-10-CM

## 2010-10-14 MED ORDER — GLIPIZIDE 5 MG PO TABS: 10.0000 mg | ORAL_TABLET | Freq: Two times a day (BID) | ORAL | Status: DC

## 2010-10-14 MED ORDER — GLIPIZIDE 10 MG PO TABS: 10.0000 mg | ORAL_TABLET | Freq: Two times a day (BID) | ORAL | Status: DC

## 2010-10-14 NOTE — Telephone Encounter (Signed)
Please call the patient and let her know that I'd like to change her Glipizide dosage. She was taking Glipizide 5mg  - 2 tabs in AM, and 1 in PM. However, I would like to escalate her dosage - so now, she is being sent in 10mg  tabs - 1 tab in AM and 1 in PM.   Thank you.  [name], D.O.

## 2010-10-14 NOTE — Progress Notes (Signed)
Will escalate her dosage of Glipizide to 20mg  daily (10mg  in AM, 10mg  in PM).  Asking Myriam Jacobson to please call pt and inform her of the change to the medication.  [name], D.O.

## 2010-11-01 ENCOUNTER — Other Ambulatory Visit: Payer: Self-pay | Admitting: Internal Medicine

## 2010-11-01 DIAGNOSIS — E1165 Type 2 diabetes mellitus with hyperglycemia: Secondary | ICD-10-CM

## 2010-11-01 MED ORDER — GLIPIZIDE 10 MG PO TABS: 10.0000 mg | ORAL_TABLET | Freq: Two times a day (BID) | ORAL | Status: DC

## 2010-11-03 ENCOUNTER — Ambulatory Visit (INDEPENDENT_AMBULATORY_CARE_PROVIDER_SITE_OTHER): Payer: Self-pay | Admitting: Internal Medicine

## 2010-11-03 ENCOUNTER — Encounter: Payer: Self-pay | Admitting: Internal Medicine

## 2010-11-03 VITALS — BP 158/98 | HR 95 | Temp 97.5°F | Ht 64.0 in | Wt 173.1 lb

## 2010-11-03 DIAGNOSIS — Z23 Encounter for immunization: Secondary | ICD-10-CM

## 2010-11-03 DIAGNOSIS — M549 Dorsalgia, unspecified: Secondary | ICD-10-CM | POA: Insufficient documentation

## 2010-11-03 DIAGNOSIS — Z0000 Encounter for general adult medical examination without abnormal findings: Secondary | ICD-10-CM | POA: Insufficient documentation

## 2010-11-03 DIAGNOSIS — E785 Hyperlipidemia, unspecified: Secondary | ICD-10-CM

## 2010-11-03 DIAGNOSIS — E1165 Type 2 diabetes mellitus with hyperglycemia: Secondary | ICD-10-CM

## 2010-11-03 DIAGNOSIS — I10 Essential (primary) hypertension: Secondary | ICD-10-CM

## 2010-11-03 MED ORDER — GLUCOSE BLOOD VI STRP: ORAL_STRIP | Status: DC

## 2010-11-03 NOTE — Patient Instructions (Addendum)
   Please follow-up at the clinic in 1-2 months, at which time we will reevaluate your blood pressure.  Please come back for your labs - please be fasting (no food/drink for 8 hours before the labs) - come back in 3-4 weeks for your labs.   Diabetes   Continue to take your Glipizide regularly (twice a day as prescribed),  Continue to work on weight loss  Check blood sugars 2-3 times a week before breakfast, call clinic if above 400 or less than 70 regularly.  Your test strips have been sent to your pharmacy.  Bring your meter to the next visit.  Call the clinic if any problems come up.   Blood pressure - start taking your medication daily, this will help you avoid long term complications from poorly controlled blood pressures.   Back pain - you can use Tylenol, and only 1-2 Aleve  Per day for your pain, as needed. If symptoms worsen, come back for reevaluation.    Do not miss your medications - Take your medications regularly and call the clinic if you have any problems with them.  Please follow-up in the clinic sooner if needed.  Follow-up with [facility] ASAP.  If symptoms worsen, or new symptoms arise, please call the clinic or go to the ER.  Please bring all of your medications in a bag to your next visit.    Back Pain Back pain is common. It can be the result of a muscle pull or strain. It can also come from changes in the bones and nerves in the spine. These changes can come from an injury or from calcium loss in your bones (osteoporosis).   HOME CARE  Avoid sports with sudden movements.   Learn how to lift or move things safely to protect your back.   Sit up straight.   Put ice in a plastic bag.   Place a towel between the skin and the bag.   Sometimes heat or a hot shower will help the pain, especially if it is from a muscle cramp.  GET HELP RIGHT AWAY IF:  You have numbness, tingling, weakness, or problems with the use of your arms or legs.   You get  very bad back pain that does not go away with medicine.   You cannot control when you poop (bowel movement) or pee (urinate).   You have increasing pain in any area of the body. This includes your belly (abdomen).   You are short of breath, dizzy, or feel faint.   You feel sick to your stomach (nauseous), throw up (vomit), or get sweaty.   You notice your toes, legs, or feet change color or get cold.   You have been injured and your back hurts.   Your back pain gets worse.  MAKE SURE YOU:    Understand these instructions.   Will watch your condition.   Will get help right away if you are not doing well or get worse.  Document Released: 07/13/2007 Document Re-Released: 04/20/2009 [facility] Patient Information 2011 [place], [place].

## 2010-11-03 NOTE — Assessment & Plan Note (Addendum)
The patient indicates intermittent low back pain occurring over the past several weeks. History is most consistent with musculoskeletal pain. Exam and history indicate no red flag symptoms or signs. There is no evidence of radiculopathy. - Patient provided handout regarding back pain and low back exercises. - Patient recommended to be very sparingly use Aleve, which she says is extremely effective for her (requiring 1-2 pills every few days) - with the understanding that she should not increase the dosage secondary to her renal impairment. She can alternatively try Tylenol for the pain. - Patient counseled on flag signs and symptoms to prompt immediate reevaluation of back pain

## 2010-11-03 NOTE — Assessment & Plan Note (Signed)
Patient due for most all of health maintenance exams. She has no insurance, which has been an impediment.  - PCV, Flu shot today.  - Eye exam due, scheduled for appt at [facility] eye care.  - Pt wants to defer colonoscopy and mammogram until can get approved for med assistance by [facility] - will address next visit.

## 2010-11-03 NOTE — Assessment & Plan Note (Addendum)
BP Readings from Last 3 Encounters:  11/03/10 158/98  09/21/10 130/88  11/11/09 113/76    Basic Metabolic Panel:    Component Value Date/Time   NA 138 12/24/2008 2022   K 4.2 12/24/2008 2022   CL 99 12/24/2008 2022   CO2 25 12/24/2008 2022   BUN 39* 12/24/2008 2022   CREATININE 1.44* 12/24/2008 2022   GLUCOSE 136* 12/24/2008 2022   CALCIUM 9.7 12/24/2008 2022    Assessment: Hypertension Control: not controlled  Progress toward goals: deteriorated  Barriers to meeting goals: nonadherence to medications   Plan: Hypertension treatment:   - continue current medications - patient counseled on medication adherence - will have her return within 3 weeks for cmet - patient counseled extensively that she MUST come back for labs in order for [place] to continue her medications, particularly given her chronic renal insufficiency.Marland Kitchen

## 2010-11-03 NOTE — Assessment & Plan Note (Addendum)
Lab Results  Component Value Date   HGBA1C 9.8 09/21/2010   HGBA1C 9.0 11/11/2009   HGBA1C 8.8 12/24/2008   Lab Results  Component Value Date   MICROALBUR 0.53 07/17/2007   LDLCALC 56 12/11/2007   CREATININE 1.44* 12/24/2008    Assessment:  Disease Control: not controlled  Progress toward goals: unable to assess  Barriers to meeting goals: patient has not been checking blood sugars because had no test strips.   Plan: Glucometer log was not reviewed today, as pt did not have glucometer available for review.  Diabetes treatment:    - continue current medications, consider addition of januvia after she gets seen by [facility] for med assistance. Pt has previously refused insulin therapy, but will consider rediscussion, particularly if cannot afford januvia or if dm persistently uncontrolled despite therapy. - reminded to bring blood glucose meter & log to each visit, discussed the need for weight loss and discussed diet - foot exam completed today. - pcv and flu immunization today. - referral for [facility] eye exam made today, appt give.

## 2010-11-03 NOTE — Assessment & Plan Note (Addendum)
Liver Function Tests:    Component Value Date/Time   AST 33 12/24/2008 2022   ALT 33 12/24/2008 2022   ALKPHOS 125* 12/24/2008 2022   BILITOT 0.4 12/24/2008 2022   PROT 7.6 12/24/2008 2022   ALBUMIN 4.6 12/24/2008 2022    Lipid Panel:     Component Value Date/Time   CHOL 137 12/11/2007 2011   TRIG 190* 12/11/2007 2011   HDL 43 12/11/2007 2011   CHOLHDL 3.2 Ratio 12/11/2007 2011   VLDL 38 12/11/2007 2011   LDLCALC 56 12/11/2007 2011     Assessment: Lipid Control: controlled, in 2009  Progress toward goals: unable to assess because labs are very old. She was scheduled for repeat labs, but no showed.  Barriers to meeting goals: has still not gotten labs done.   Plan: Lipid treatment:   -continue current medications - CMP and FLP asap.

## 2010-11-03 NOTE — Progress Notes (Signed)
Subjective:    Patient ID: [name], female    DOB: 12-14-1955, 55 y.o.   MRN: [id number]  HPI Pt is a 55 y.o. female who  has a past medical history of Diabetes mellitus type II, uncontrolled; Hypertension; Hyperlipidemia; Ovarian cyst, left; Anemia; Vaginal cyst; and Chronic kidney disease. and presents to clinic today for the following:   1) Low back pain - Patient describes intermittent aching, back pain located over low back (not flank pain) occuring over the last few weeks. [name] at this time. Rated 5/10 in severity at its worst, currently 0/10. Aggravated by nothing specific - no food, activity, prolonged sitting, or standing association. Alleviated by Aleve (uses max 1-2/ day). denies associated weakness, numbness, tingling, dysuria, leg pain, abdominal pain, incontinence. Denies weight loss, history of cancer, history of osteoporosis, history of steroid use.  2) HTN - Patient does not check blood pressure regularly at home. Currently prescribed enalapril-hctz 5mg -12.5mg  - has been out for 2 weeks and has been under a lot of stress. denies headaches, dizziness, lightheadedness, chest pain, shortness of breath.  does not request refills today.   3) DM, II -  Lab Results  Component Value Date   HGBA1C 9.8 09/21/2010   Patient checking blood sugars 0 times daily, does not have enough test strips. Currently taking glipizide (Glucotrol) 20mg  daily. admits to polyuria, but denies polydipsia, nausea, vomiting, diarrhea.  does request refills of her test strips today.  4) Preventative Health Care - patient due for most all of health maintenance exams. She has no insurance, which has been an impediment. Due for PCV, Flu shot, agreeable to complete today. Eye exam due, scheduled for appt at [facility] eye care. Pt wants to defer colonoscopy and mammogram until can get approved for med assistance by [facility].    Review of Systems Per HPI.  Current Outpatient Medications Medication Sig  .  Enalapril-Hydrochlorothiazide 5-12.5 MG per tablet TAKE 1 TABLET BY MOUTH ONCE DAILY  . glipiZIDE (GLUCOTROL) 10 MG tablet Take 1 tablet (10 mg total) by mouth 2 (two) times daily before a meal.  . pravastatin (PRAVACHOL) 40 MG tablet TAKE ONE TABLET BY MOUTH DAILY    Allergies Review of patient's allergies indicates no known allergies.  Past Medical History  Diagnosis Date  . Diabetes mellitus type II, uncontrolled   . Hypertension   . Hyperlipidemia   . Ovarian cyst, left   . Anemia     due to menorrhagia, BL 8-10  . Vaginal cyst     nabothian and bartholin  . Chronic kidney disease     baseline creatinine 1.4-1.7    Past Surgical History  Procedure Date  . 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 Well-developed, well-nourished, in no acute distress; alert, appropriate and cooperative throughout examination.  Head: Normocephalic, atraumatic.  Lungs:  Normal respiratory effort. Clear to auscultation BL without crackles or wheezes.  Heart: RRR. S1 and S2 normal without gallop, (+) murmur.  Abdomen:  BS normoactive. Soft, Nondistended, non-tender.  No masses or organomegaly.  Extremities: No pretibial edema. Back exam: normal DTRs, muscle strength and reflexes, normal range of motion.        Assessment & Plan:  Case and plan of  care discussed with Dr. Margarito Liner.

## 2010-11-04 ENCOUNTER — Inpatient Hospital Stay (HOSPITAL_COMMUNITY)
Admission: AD | Admit: 2010-11-04 | Discharge: 2010-11-04 | Disposition: A | Discharge status: Home / Self Care | Payer: Self-pay | Source: Ambulatory Visit | Attending: Obstetrics and Gynecology | Admitting: Obstetrics and Gynecology

## 2010-11-04 ENCOUNTER — Encounter (HOSPITAL_COMMUNITY): Payer: Self-pay

## 2010-11-04 DIAGNOSIS — N390 Urinary tract infection, site not specified: Secondary | ICD-10-CM | POA: Insufficient documentation

## 2010-11-04 DIAGNOSIS — R109 Unspecified abdominal pain: Secondary | ICD-10-CM | POA: Insufficient documentation

## 2010-11-04 DIAGNOSIS — A499 Bacterial infection, unspecified: Secondary | ICD-10-CM

## 2010-11-04 DIAGNOSIS — N760 Acute vaginitis: Secondary | ICD-10-CM | POA: Insufficient documentation

## 2010-11-04 DIAGNOSIS — B9689 Other specified bacterial agents as the cause of diseases classified elsewhere: Secondary | ICD-10-CM | POA: Insufficient documentation

## 2010-11-04 HISTORY — DX: Anxiety disorder, unspecified: F41.9

## 2010-11-04 HISTORY — DX: Depression, unspecified: F32.A

## 2010-11-04 HISTORY — DX: Major depressive disorder, single episode, unspecified: F32.9

## 2010-11-04 LAB — DIFFERENTIAL
Lymphocytes Relative: 11 % — ABNORMAL LOW (ref 12–46)
Lymphs Abs: 1.1 10*3/uL (ref 0.7–4.0)
Monocytes Absolute: 0.4 10*3/uL (ref 0.1–1.0)
Monocytes Relative: 4 % (ref 3–12)
Neutro Abs: 9.2 10*3/uL — ABNORMAL HIGH (ref 1.7–7.7)

## 2010-11-04 LAB — WET PREP, GENITAL
Clue Cells Wet Prep HPF POC: NONE SEEN
Trich, Wet Prep: NONE SEEN

## 2010-11-04 LAB — CBC
HCT: 36.8 % (ref 36.0–46.0)
Hemoglobin: 12.7 g/dL (ref 12.0–15.0)
RBC: 4.23 MIL/uL (ref 3.87–5.11)
WBC: 10.8 10*3/uL — ABNORMAL HIGH (ref 4.0–10.5)

## 2010-11-04 LAB — URINALYSIS, ROUTINE W REFLEX MICROSCOPIC
Glucose, UA: NEGATIVE mg/dL
Hgb urine dipstick: NEGATIVE
Specific Gravity, Urine: >1.03 — ABNORMAL HIGH (ref 1.005–1.030)
Urobilinogen, UA: 0.2 mg/dL (ref 0.0–1.0)

## 2010-11-04 LAB — URINE MICROSCOPIC-ADD ON

## 2010-11-04 LAB — GLUCOSE, CAPILLARY: Glucose-Capillary: 357 mg/dL — ABNORMAL HIGH (ref 70–99)

## 2010-11-04 MED ORDER — CIPROFLOXACIN HCL 500 MG PO TABS: 500.0000 mg | ORAL_TABLET | Freq: Two times a day (BID) | ORAL | Status: AC

## 2010-11-04 MED ORDER — METRONIDAZOLE 500 MG PO TABS: 500.0000 mg | ORAL_TABLET | Freq: Two times a day (BID) | ORAL | Status: AC

## 2010-11-04 MED ORDER — FLEET ENEMA 7-19 GM/118ML RE ENEM
1.0000 | ENEMA | Freq: Once | RECTAL | Status: AC
  Administered 2010-11-04: 1 via RECTAL

## 2010-11-04 NOTE — ED Provider Notes (Signed)
History     CSN: [id number] Arrival date & time: 11/04/2010 12:50 PM  Chief Complaint  Patient presents with  . Abdominal Pain     HPI [name] is a 55 y.o. female who presents to [facility] via EMS for right side abdominal pain that started a month ago but over the past few days has gotten worse. The pain comes and goes, gets better with aleve but then comes back. Went to [facility] yesterday and was evaluated and told the pain was due to muscle strain.   Past Medical History  Diagnosis Date  . Diabetes mellitus type II, uncontrolled   . Hypertension   . Hyperlipidemia   . Ovarian cyst, left   . Anemia     due to menorrhagia, BL 8-10  . Vaginal cyst     nabothian and bartholin  . Chronic kidney disease     baseline creatinine 1.4-1.7  . Anxiety   . Depression     Past Surgical History  Procedure Date  . 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 Stroke Father   . Heart attack Father     Had MI in his 82s  . Stomach cancer Paternal Grandmother   . Diabetes Maternal Grandmother   . Cerebral palsy Daughter     History  Substance Use Topics  . Smoking status: Never Smoker   . Smokeless tobacco: Former Neurosurgeon  . Alcohol Use: No    OB History    Grav Para Term Preterm Abortions TAB SAB Ect Mult Living   3 1 1  2  2   1       Review of Systems  Constitutional: Negative for fever, chills, diaphoresis and fatigue.  HENT: Negative for ear pain, congestion, sore throat, facial swelling, neck pain, neck stiffness, dental problem and sinus pressure.   Eyes: Negative for photophobia, pain and discharge.  Respiratory: Negative for cough, chest tightness and wheezing.   Cardiovascular:       Cardiac surgery at 46 months of age.  Gastrointestinal: Positive for abdominal pain. Negative for nausea, vomiting, diarrhea, constipation and abdominal distention.  Genitourinary: Positive for  frequency. Negative for dysuria, flank pain and difficulty urinating.  Musculoskeletal: Positive for back pain. Negative for myalgias and gait problem.  Skin: Negative for color change and rash.  Neurological: Negative for dizziness, speech difficulty, weakness, light-headedness, numbness and headaches.    Allergies  Review of patient's allergies indicates no known allergies.  Home Medications  No current outpatient prescriptions on file.  BP 150/85  Pulse 110  Temp(Src) 97.4 F (36.3 C) (Oral)  Resp 20  Ht 5' 4.5" (1.638 m)  Wt 173 lb (78.472 kg)  BMI 29.24 kg/m2  SpO2 98%  Physical Exam  Nursing note and vitals reviewed. Constitutional: She is oriented to person, place, and time. She appears well-developed and well-nourished.  Eyes: EOM are normal.  Neck: Neck supple.  Pulmonary/Chest: Effort normal.  Abdominal: Soft. There is no tenderness.       Patient without pain at this time.  Genitourinary:       Vaginal irritation, yellow, frothy, malodorous discharge noted. No CMT, mildly tender right adnexa. No palpable enlargement of uterus.  Musculoskeletal: Normal range of motion.  Neurological: She is alert and oriented to person, place, and time. No cranial nerve deficit.  Skin: Skin is warm and dry.  No results  found for this or any previous visit (from the past 24 hour(s)).  ED Course  Procedures  Results for orders placed during the hospital encounter of 11/04/10 (from the past 48 hour(s))  URINALYSIS, ROUTINE W REFLEX MICROSCOPIC     Status: Abnormal   Collection Time   11/04/10  1:10 PM      Component Value Range Comment   Color, Urine YELLOW  YELLOW     Appearance CLEAR  CLEAR     Specific Gravity, Urine >1.030 (*) 1.005 - 1.030     pH 5.5  5.0 - 8.0     Glucose, UA NEGATIVE  NEGATIVE (mg/dL)    Hgb urine dipstick NEGATIVE  NEGATIVE     Bilirubin Urine NEGATIVE  NEGATIVE     Ketones, ur NEGATIVE  NEGATIVE (mg/dL)    Protein, ur NEGATIVE  NEGATIVE (mg/dL)     Urobilinogen, UA 0.2  0.0 - 1.0 (mg/dL)    Nitrite NEGATIVE  NEGATIVE     Leukocytes, UA MODERATE (*) NEGATIVE    URINE MICROSCOPIC-ADD ON     Status: Abnormal   Collection Time   11/04/10  1:10 PM      Component Value Range Comment   Squamous Epithelial / LPF MANY (*) RARE     WBC, UA 7-10  <3 (WBC/hpf)    Bacteria, UA MANY (*) RARE    CBC     Status: Abnormal   Collection Time   11/04/10  2:09 PM      Component Value Range Comment   WBC 10.8 (*) 4.0 - 10.5 (K/uL)    RBC 4.23  3.87 - 5.11 (MIL/uL)    Hemoglobin 12.7  12.0 - 15.0 (g/dL)    HCT 08.6  57.8 - 46.9 (%)    MCV 87.0  78.0 - 100.0 (fL)    MCH 30.0  26.0 - 34.0 (pg)    MCHC 34.5  30.0 - 36.0 (g/dL)    RDW 62.9  52.8 - 41.3 (%)    Platelets 189  150 - 400 (K/uL)   DIFFERENTIAL     Status: Abnormal   Collection Time   11/04/10  2:09 PM      Component Value Range Comment   Neutrophils Relative 86 (*) 43 - 77 (%)    Neutro Abs 9.2 (*) 1.7 - 7.7 (K/uL)    Lymphocytes Relative 11 (*) 12 - 46 (%)    Lymphs Abs 1.1  0.7 - 4.0 (K/uL)    Monocytes Relative 4  3 - 12 (%)    Monocytes Absolute 0.4  0.1 - 1.0 (K/uL)    Eosinophils Relative 0  0 - 5 (%)    Eosinophils Absolute 0.0  0.0 - 0.7 (K/uL)    Basophils Relative 0  0 - 1 (%)    Basophils Absolute 0.0  0.0 - 0.1 (K/uL)   WET PREP, GENITAL     Status: Abnormal   Collection Time   11/04/10  2:40 PM      Component Value Range Comment   Yeast, Wet Prep NONE SEEN  NONE SEEN     Trich, Wet Prep NONE SEEN  NONE SEEN     Clue Cells, Wet Prep NONE SEEN  NONE SEEN     WBC, Wet Prep HPF POC TOO NUMEROUS TO COUNT (*) NONE SEEN  MANY BACTERIA SEEN  GC/CHLAMYDIA PROBE AMP, GENITAL     Status: Normal   Collection Time   11/04/10  2:40 PM  Component Value Range Comment   GC Probe Amp, Genital NEGATIVE  NEGATIVE     Chlamydia, DNA Probe NEGATIVE  NEGATIVE    GLUCOSE, CAPILLARY     Status: Abnormal   Collection Time   11/04/10  3:19 PM      Component Value Range Comment    Glucose-Capillary 357 (*) 70 - 99 (mg/dL)     Assessment: UTI   BV  Plan:   Treat UTI    Treat BV MDM          Kerrie Buffalo, NP 11/06/10 [phone]

## 2010-11-04 NOTE — ED Notes (Signed)
Large bm post enema

## 2010-11-04 NOTE — Progress Notes (Signed)
Pt states was seen at [facility] yesterday, told she is having r sided musculoskeletal pain, fearful she has something else going on. Pt is extremely upset/weary about her care as a patient. Has had many issues with her daughter's medical care, is tearful in room.

## 2010-11-04 NOTE — Progress Notes (Signed)
Pt states she has a history of ovarian cysts. Has been having abdominal pain for a long time. Has recently gotten worse, leaking urine. Was seen at [facility] and was told the pain was muscles strain.

## 2010-11-05 LAB — GC/CHLAMYDIA PROBE AMP, GENITAL: Chlamydia, DNA Probe: NEGATIVE

## 2010-11-08 NOTE — ED Provider Notes (Signed)
Agree with above note.  [name] 11/08/2010 9:12 AM   

## 2010-12-07 ENCOUNTER — Other Ambulatory Visit: Payer: Self-pay

## 2010-12-23 ENCOUNTER — Encounter: Payer: Self-pay | Admitting: Internal Medicine

## 2010-12-23 ENCOUNTER — Ambulatory Visit (INDEPENDENT_AMBULATORY_CARE_PROVIDER_SITE_OTHER): Payer: Self-pay | Admitting: Internal Medicine

## 2010-12-23 VITALS — BP 124/72 | HR 70 | Temp 97.4°F | Ht 66.0 in | Wt 173.5 lb

## 2010-12-23 DIAGNOSIS — E785 Hyperlipidemia, unspecified: Secondary | ICD-10-CM

## 2010-12-23 DIAGNOSIS — I10 Essential (primary) hypertension: Secondary | ICD-10-CM

## 2010-12-23 DIAGNOSIS — Z0000 Encounter for general adult medical examination without abnormal findings: Secondary | ICD-10-CM

## 2010-12-23 DIAGNOSIS — E1165 Type 2 diabetes mellitus with hyperglycemia: Secondary | ICD-10-CM

## 2010-12-23 LAB — LIPID PANEL
Cholesterol: 213 mg/dL — ABNORMAL HIGH (ref 0–200)
Total CHOL/HDL Ratio: 6.3 Ratio
Triglycerides: 671 mg/dL — ABNORMAL HIGH (ref <150)

## 2010-12-23 LAB — COMPREHENSIVE METABOLIC PANEL
BUN: 39 mg/dL — ABNORMAL HIGH (ref 6–23)
CO2: 29 mEq/L (ref 19–32)
Calcium: 9.6 mg/dL (ref 8.4–10.5)
Chloride: 97 mEq/L (ref 96–112)
Creat: 1.57 mg/dL — ABNORMAL HIGH (ref 0.50–1.10)
Glucose, Bld: 318 mg/dL — ABNORMAL HIGH (ref 70–99)
Total Bilirubin: 0.4 mg/dL (ref 0.3–1.2)
Total Protein: 7.2 g/dL (ref 6.0–8.3)

## 2010-12-23 MED ORDER — ASPIRIN 81 MG PO TABS: 81.0000 mg | ORAL_TABLET | Freq: Every day | ORAL | Status: DC

## 2010-12-23 NOTE — Assessment & Plan Note (Addendum)
Liver Function Tests:    Component Value Date/Time   AST 33 12/24/2008 2022   ALT 33 12/24/2008 2022   ALKPHOS 125* 12/24/2008 2022   BILITOT 0.4 12/24/2008 2022   PROT 7.6 12/24/2008 2022   ALBUMIN 4.6 12/24/2008 2022    Lipid Panel:     Component Value Date/Time   CHOL 137 12/11/2007 2011   TRIG 190* 12/11/2007 2011   HDL 43 12/11/2007 2011   CHOLHDL 3.2 Ratio 12/11/2007 2011   VLDL 38 12/11/2007 2011   LDLCALC 56 12/11/2007 2011    Assessment: Lipid Control: controlled, in 2009  Progress toward goals: unable to assess because labs are very old. She was scheduled for repeat labs, but no showed.  Barriers to meeting goals: has still not gotten labs done.   Plan: Lipid treatment:    Continue current medications  Lipid panel and CMET today - pt notably not fasting.

## 2010-12-23 NOTE — Assessment & Plan Note (Addendum)
Lab Results  Component Value Date   HGBA1C 10.5 12/23/2010   HGBA1C 9.8 09/21/2010   HGBA1C 9.0 11/11/2009   Lab Results  Component Value Date   MICROALBUR 0.53 07/17/2007   LDLCALC 56 12/11/2007   CREATININE 1.44* 12/24/2008    Assessment:  Disease Control: not controlled  Progress toward goals: deteriorated  Barriers to meeting goals: lack of insurance and has not followed up with Chaunce[employer] Reading for medication assistance qualification.   Plan: Glucometer log was not reviewed today, as pt did not have glucometer available for review.      continue current medications  Discussed diet  Plan to follow up with Norm Parcel for diabetic education and nutrition counseling once gets orange card  Gave handout regarding diabetic diet.  Advised to check blood sugars 2-3 times a week before breakfast.  Pt still is not willing to start insulin despite worsening diabetic control  HAS TO FOLLOW UP WITH [facility]. Needs MAP so she can consider other oral meds.   No Metformin bc of renal function.  Advised, and indicates understanding of multiple complications, risks associated with poor diabetic control including, but not limited to, increased risk of heart disease, heart attacks, and strokes.  Start baby aspirin.

## 2010-12-23 NOTE — Assessment & Plan Note (Addendum)
Patient due for mammo, Tdap, colonoscopy. She has no insurance, which has been an impediment. Eye exam, PCV, flu shot UTD.  Refuses Tdap  Will give mammogram scholarship.  Will give stool cards x 3.

## 2010-12-23 NOTE — Progress Notes (Signed)
Subjective:   Patient ID: [name] female   DOB: February 23, 1955 55 y.o.   MRN: [id number]  HPI: Pt is a 55 y.o. female who  has a past medical history of Diabetes mellitus type II, uncontrolled, hypertension, hyperlipidemia, and chronic kidney disease. and presents to clinic today for the following:  1) DM, II - HgA1c 10.5 - Patient checking blood sugars 0 times daily. Currently taking glipizide, 10mg  BID. Confirms nocturia, denies, polydipsia, nausea, vomiting, diarrhea.  does not request refills today.  2) HTN - Patient does not check blood pressure regularly at home. Currently taking Enalapril-HCTZ. denies headaches, dizziness, lightheadedness, chest pain, shortness of breath.  does not request refills today.  3) HLD - Currently taking Pravachol (pravastatin) 40mg .  denies chest pain, difficulty breathing, palpitations, tachycardia, and muscle pains. does not request refills today.  4) Preventative Health Care - patient due for multiple health maintenance exams. She has no insurance, which has been an impediment. She was supposed to follow-up with Chauncey Reading to get approved for orange card, but has not done so. Pt wants to defer colonoscopy until can get approved for med assistance by [facility]. She also needs mammogram and Tdap.    Review of Systems: Per HPI.  Current Outpatient Medications: Medication Sig  . Enalapril-Hydrochlorothiazide 5-12.5 MG per tablet TAKE 1 TABLET BY MOUTH ONCE DAILY  . glipiZIDE (GLUCOTROL) 10 MG tablet Take 1 tablet (10 mg total) by mouth 2 (two) times daily before a meal.  . glucose blood test strip Check blood sugars 2-3 times a week before breakfast.  . naproxen sodium (ANAPROX) 220 MG tablet Take 220 mg by mouth every 8 (eight) hours as needed. Patient takes for pain   . pravastatin (PRAVACHOL) 40 MG tablet TAKE ONE TABLET BY MOUTH DAILY    Allergies: No Known Allergies  Past Medical History  Diagnosis Date  . Diabetes mellitus type II, uncontrolled     . Hypertension   . Hyperlipidemia   . Ovarian cyst, left   . Anemia     due to menorrhagia, BL 8-10  . Vaginal cyst     nabothian and bartholin  . Chronic kidney disease     baseline creatinine 1.4-1.7  . Anxiety   . Depression     Past Surgical History  Procedure Date  . 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 Well-developed, well-nourished, in no acute distress; alert, appropriate and cooperative throughout examination.  Head: Normocephalic, atraumatic.  Eyes: conjunctivae/corneas clear. PERRL, EOM's intact. Fundi benign.  Ears: TM nonerythematous, not bulging, good light reflex bilaterally.  Nose: Mucous membranes moist, not inflammed, nonerythematous.  Throat: Oropharynx nonerythematous, no exudate appreciated.   Neck: No deformities, masses, or tenderness noted.  Lungs:  Normal respiratory effort. Clear to auscultation BL without crackles or wheezes.  Heart: RRR. S1 and S2 normal without gallop, or rubs. (+) systolic murmur.  Abdomen:  BS normoactive. Soft, Nondistended, non-tender.  No masses or organomegaly.  Extremities: No pretibial edema.      Assessment & Plan:  Case and plan of care discussed with Dr. Ulyess Mort.

## 2010-12-23 NOTE — Assessment & Plan Note (Signed)
BP Readings from Last 3 Encounters:  12/23/10 124/72  11/04/10 134/94  11/03/10 158/98    Basic Metabolic Panel:    Component Value Date/Time   NA 138 12/24/2008 2022   K 4.2 12/24/2008 2022   CL 99 12/24/2008 2022   CO2 25 12/24/2008 2022   BUN 39* 12/24/2008 2022   CREATININE 1.44* 12/24/2008 2022   GLUCOSE 136* 12/24/2008 2022   CALCIUM 9.7 12/24/2008 2022    Assessment: Hypertension Control: controlled  Progress toward goals: improved  Barriers to meeting goals: no barriers identified   Plan:  continue current medications

## 2010-12-23 NOTE — Patient Instructions (Addendum)
Please follow-up at the clinic in 3 months, at which time we will reevaluate your diabetes, blood pressure, cholesterol.  Please follow-up in the clinic sooner if needed.  Please turn in your stool cards.  Please follow-up with Chauncey Reading to get approved for the orange card.  Please follow-up for your mammogram.  Start a baby aspirin daily.  If you are able - check your blood sugars 2-3 times a week before breakfast.  Please work on improving your diet and exercise to help with your diabetes and blood pressure.  If you have been started on new medication(s), and you develop symptoms concerning for allergic reaction, including, but not limited to, throat closing, tongue swelling, rash, please stop the medication immediately and call the clinic at [phone], and go to the ER.  If you are diabetic, please bring your meter to your next visit.  If symptoms worsen, or new symptoms arise, please call the clinic or go to the ER.  Please bring all of your medications in a bag to your next visit.    1800 Calorie Diabetic Diet The 1800 calorie diabetic diet is designed for eating up to 1800 calories each day. Following this diet and making healthy meal choices can help improve overall health. It controls blood glucose (sugar) levels, and it can also help lower blood pressure and cholesterol. SERVING SIZES Measuring foods and serving sizes helps to make sure you are getting the right amount of food. The list below tells how big or small some common serving sizes are:  1 oz.........4 stacked dice.     3 oz........Marland KitchenDeck of cards.     1 tsp.......Marland KitchenTip of little finger.     1 tbs......Marland KitchenMarland KitchenThumb.     2 tbs.......Marland KitchenGolf ball.      cup......Marland KitchenHalf of a fist.     1 cup.......Marland KitchenA fist.  GUIDELINES FOR CHOOSING FOODS The goal of this diet is to eat a variety of foods and limit calories to 1800 each day. This can be done by choosing foods that are low in calories and fat. The diet also suggests  eating small amounts of food frequently. Doing this helps control your blood glucose levels so they do not get too high or too low. Each meal or snack may include a protein food source to help you feel more satisfied. Try to eat about the same amount of food around the same time each day. This includes weekend days, travel days, and days off work. Space your meals about 4 to 5 hours apart, and add a snack between them, if you wish.   For example, a daily food plan could include breakfast, a morning snack, lunch, dinner, and an evening snack. Healthy meals and snacks have different types of foods, including whole grains, vegetables, fruits, lean meats, poultry, fish, and dairy products. As you plan your meals, select a variety of foods. Choose from the bread and starch, vegetable, fruit, dairy, and meat/protein groups. Examples of foods from each group are listed below with their suggested serving sizes. Use measuring cups and spoons to become familiar with what a healthy portion looks like. Bread and Starch Each serving equals 15 grams of carbohydrates.  1 slice bread.      bagel.      cup cold cereal (unsweetened).      cup hot cereal or mashed potatoes.     1 small potato (size of a computer mouse).     ? cup cooked pasta or rice.  English muffin.     1 cup broth-based soup.     3 cups of popcorn.     4 to 6 whole-wheat crackers.      cup cooked beans, peas, or corn.  Vegetables Each serving equals 5 grams of carbohydrates.   cup cooked vegetables.     1 cup raw vegetables.      cup tomato or vegetable juice.  Fruit Each serving equals 15 grams of carbohydrates.  1 small apple or orange.     1  cup watermelon or strawberries.      cup applesauce (no sugar added).     2 tbs raisins.      banana.      cup canned fruit, packed in water or in its own juice.      cup unsweetened fruit juice.  Dairy Each serving equals 12 to 15 grams of  carbohydrates.  1 cup fat-free milk.     6 oz artificially sweetened yogurt or plain yogurt.     1 cup low-fat buttermilk.     1 cup soy milk.     1 cup almond milk.  Meat/Protein  1 large egg.     2 to 3 oz meat, poultry, or fish.      cup low-fat cottage cheese.     1 tbs peanut butter.     1 oz low-fat cheese.      cup tuna, packed in water.      cup tofu.  Fat  1 tsp oil.     1 tsp trans-fat-free margarine.     1 tsp butter.     1 tsp mayonnaise.     2 tbs avocado.     1 tbs salad dressing.     1 tbs cream cheese.     2 tbs sour cream.  SAMPLE 1800 CALORIE DIET PLAN Breakfast   cup unsweetened cereal (1 carb serving).     1 cup fat-free milk (1 carb serving).     1 slice whole-wheat toast (1 carb serving).      small banana (1 carb serving).     1 scrambled egg.     1 tsp trans-fat-free margarine.  Lunch  Tuna sandwich.     2 slices whole-wheat bread (2 carb servings).      cup canned tuna in water, drained.     1 tbs reduced fat mayonnaise.     1 stalk celery, chopped.     2 slices tomato.     1 lettuce leaf.     1 cup carrot sticks.     24 to 30 seedless grapes (2 carb servings).     6 oz light yogurt (1 carb serving).  Afternoon Snack  3 graham cracker squares (1 carb serving).     1 cup fat-free milk (1 carb serving).     1 tbs peanut butter.  Dinner  3 oz salmon, broiled with 1 tsp oil.     1 cup mashed potatoes (2 carb servings) with 1 tsp trans-fat-free margarine.     1 cup fresh or frozen green beans.     1 cup steamed asparagus.     1 cup fat-free milk (1 carb serving).  Evening Snack  3 cups of air-popped popcorn (1 carb serving).     2 tbs Parmesan cheese.  Meal Plan You can use this worksheet to help you make a daily meal plan based on the 1800 calorie diabetic diet suggestions. If you are  using this plan to help you control your blood glucose, you may interchange carbohydrate-containing foods  (dairy, starches, and fruits). Select a variety of fresh foods of varying colors and flavors. The total amount of carbohydrate in your meals or snacks is more important than making sure you include all of the food groups every time you eat. Choose from the approximate amount of the following foods to build your day's meals:  8 Starches.     4 Vegetables.     3 Fruits.     2 Dairy.     6 to 7 oz Meat/Protein.     Up to 4 Fats.  Your dietician can use this worksheet to help you decide how many servings and which types of foods are right for you. BREAKFAST Food Group and Servings / Food Choice Starches _______________________________________________________ Dairy __________________________________________________________ Fruit ___________________________________________________________ Meat/Protein ____________________________________________________ Fat ____________________________________________________________ LUNCH Food Group and Servings / Food Choice Starch _________________________________________________________ Meat/Protein ___________________________________________________ Vegetables _____________________________________________________ Fruit __________________________________________________________ Dairy __________________________________________________________ Fat ____________________________________________________________ [name] Food Group and Servings / Food Choice Starch ________________________________________________________ Meat/Protein ___________________________________________________ Fruit __________________________________________________________ Dairy __________________________________________________________ [name] Food Group and Servings / Food Choice Starches _______________________________________________________ Meat/Protein ___________________________________________________ Dairy  __________________________________________________________ Vegetable ______________________________________________________ Fruit ___________________________________________________________ Fat ____________________________________________________________ [name] Food Group and Servings / Food Choice Fruit __________________________________________________________ Meat/Protein ___________________________________________________ Dairy __________________________________________________________ Starch _________________________________________________________ DAILY TOTALS Starches _________________________ Vegetables _______________________ Fruits ____________________________ Dairy ____________________________ Meat/Protein_____________________ Fats _____________________________ Document Released: 08/16/2004 Document Revised: 10/06/2010 Document Reviewed: 12/11/2008 Exit[employer] Patient Information 2012 [place], [place].

## 2010-12-28 ENCOUNTER — Encounter: Payer: Self-pay | Admitting: *Deleted

## 2010-12-28 ENCOUNTER — Other Ambulatory Visit: Payer: Self-pay | Admitting: Internal Medicine

## 2010-12-28 DIAGNOSIS — E785 Hyperlipidemia, unspecified: Secondary | ICD-10-CM

## 2010-12-28 NOTE — Assessment & Plan Note (Signed)
Needs to come back to complete a fasting lipid panel. During last visit we did a lipid panel, but pt had eaten just prior. We specifically ordered it knowing she had already eaten because she keep no-showing to lab visits, and were hoping that her trigs wouldn't be so high as to not allow for evaluation of the LDL. However, her trigs were > 600, therefore LDL could not be calculated.  I have asked Lela to call and notify patient of this.   [name], D.O.

## 2011-01-13 ENCOUNTER — Ambulatory Visit (INDEPENDENT_AMBULATORY_CARE_PROVIDER_SITE_OTHER): Payer: Self-pay | Admitting: Obstetrics & Gynecology

## 2011-01-13 ENCOUNTER — Encounter: Payer: Self-pay | Admitting: Obstetrics & Gynecology

## 2011-01-13 VITALS — BP 143/81 | HR 109 | Temp 98.1°F | Ht 63.0 in | Wt 169.7 lb

## 2011-01-13 DIAGNOSIS — R102 Pelvic and perineal pain: Secondary | ICD-10-CM

## 2011-01-13 DIAGNOSIS — N949 Unspecified condition associated with female genital organs and menstrual cycle: Secondary | ICD-10-CM

## 2011-01-13 LAB — POCT URINALYSIS DIP (DEVICE)
Bilirubin Urine: NEGATIVE
Hgb urine dipstick: NEGATIVE
Ketones, ur: NEGATIVE mg/dL
Protein, ur: NEGATIVE mg/dL
pH: 6 (ref 5.0–8.0)

## 2011-01-13 NOTE — Progress Notes (Signed)
History:  55 y.o. postmenopausal G3P1021 here today for evaluation of right pelvic pain that she attributed to her ovaries.  Patient says pain comes frequently.  Also reports increased urinary frequency especially at night.  No fevers, chills, sweats, bleeding or other symptoms.  The following portions of the patient's history were reviewed and updated as appropriate: allergies, current medications, past family history, past medical history, past social history, past surgical history and problem list.   Objective:  Physical Exam Blood pressure 143/81, pulse 109, temperature 98.1 F (36.7 C), temperature source Oral, height 5\' 3"  (1.6 m), weight 169 lb 11.2 oz (76.975 kg). 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  Normal cervix.  Normal discharge.  Small uterus, no palpable masses or adnexal tenderness.  UA in office: Negative   Assessment & Plan:  Will obtain pelvic ultrasound for further evaluation and will follow up results and manage accordingly. Negative urinalysis. Preventative health maintenance emphasized; patient is attempting to get  mammogram and colonoscopy scheduled. Return to clinic for any gynecologic concerns or for annual exam.

## 2011-01-13 NOTE — Patient Instructions (Signed)

## 2011-01-17 ENCOUNTER — Ambulatory Visit (HOSPITAL_COMMUNITY): Payer: Self-pay

## 2011-01-20 ENCOUNTER — Ambulatory Visit (HOSPITAL_COMMUNITY)
Admission: RE | Admit: 2011-01-20 | Discharge: 2011-01-20 | Disposition: A | Discharge status: Home / Self Care | Payer: Self-pay | Source: Ambulatory Visit | Attending: Obstetrics & Gynecology | Admitting: Obstetrics & Gynecology

## 2011-01-20 ENCOUNTER — Telehealth: Payer: Self-pay | Admitting: *Deleted

## 2011-01-20 DIAGNOSIS — Z780 Asymptomatic menopausal state: Secondary | ICD-10-CM | POA: Insufficient documentation

## 2011-01-20 DIAGNOSIS — R102 Pelvic and perineal pain: Secondary | ICD-10-CM

## 2011-01-20 DIAGNOSIS — N949 Unspecified condition associated with female genital organs and menstrual cycle: Secondary | ICD-10-CM | POA: Insufficient documentation

## 2011-01-20 NOTE — Telephone Encounter (Signed)
PATIENT WAS CONTACTED BY PHONE AND GIVEN APPT FOR REPEAT LABS FASTING.  HER APPT IS FOR 12-18-012 @ 9:30AM.  PATIENT WAS INSTRUCTED NOT TO EAT NOR DRINK AFTER 12:00 MIDNIGHT OF THE 17TH. AND TO ALSO START TAKING DAILY A BABY ASPRIN.  SHE UNDERSTOOD THESE INSTRUCTIONS.  [name] NTII  12-13-012  4:28PM

## 2011-01-24 ENCOUNTER — Telehealth: Payer: Self-pay | Admitting: *Deleted

## 2011-01-24 NOTE — Telephone Encounter (Signed)
Pt left message requesting [place] results info.

## 2011-01-25 ENCOUNTER — Other Ambulatory Visit (INDEPENDENT_AMBULATORY_CARE_PROVIDER_SITE_OTHER): Payer: Self-pay

## 2011-01-25 ENCOUNTER — Other Ambulatory Visit: Payer: Self-pay

## 2011-01-25 DIAGNOSIS — E785 Hyperlipidemia, unspecified: Secondary | ICD-10-CM

## 2011-01-25 LAB — LIPID PANEL
HDL: 36 mg/dL — ABNORMAL LOW (ref >39)
LDL Cholesterol: 110 mg/dL — ABNORMAL HIGH (ref 0–99)
Total CHOL/HDL Ratio: 5.7 Ratio
VLDL: 60 mg/dL — ABNORMAL HIGH (ref 0–40)

## 2011-01-25 NOTE — Telephone Encounter (Signed)
Normal postmenopausal pelvic ultrasound; patient can be called and informed of these results.

## 2011-01-26 ENCOUNTER — Encounter (HOSPITAL_COMMUNITY): Payer: Self-pay | Admitting: *Deleted

## 2011-01-26 ENCOUNTER — Inpatient Hospital Stay (HOSPITAL_COMMUNITY)
Admission: AD | Admit: 2011-01-26 | Discharge: 2011-01-26 | Disposition: A | Discharge status: Home / Self Care | Payer: Self-pay | Source: Ambulatory Visit | Attending: Obstetrics & Gynecology | Admitting: Obstetrics & Gynecology

## 2011-01-26 ENCOUNTER — Telehealth: Payer: Self-pay | Admitting: *Deleted

## 2011-01-26 DIAGNOSIS — R109 Unspecified abdominal pain: Secondary | ICD-10-CM | POA: Insufficient documentation

## 2011-01-26 DIAGNOSIS — Z780 Asymptomatic menopausal state: Secondary | ICD-10-CM | POA: Insufficient documentation

## 2011-01-26 DIAGNOSIS — N949 Unspecified condition associated with female genital organs and menstrual cycle: Secondary | ICD-10-CM | POA: Insufficient documentation

## 2011-01-26 NOTE — Telephone Encounter (Signed)
Returned pt's call- she has called on 12/17 with request for [place] results. I informed her that Dr. Macon Large has reviewed the report and that she has a normal postmenopausal exam. Pt states that she went to the ER today ([facility]) but did not stay. She says she is happy to know that it was normal but she is still having problems. "I'm going to call my doctor tomorrow."  I told pt that I hoped she would be feeling better soon.

## 2011-01-26 NOTE — ED Provider Notes (Signed)
History:  55 y.o. [id number] here today for results of her ultrasound that she underwent on 01/20/11, after evaluation in clinic. She reports having some pain that was alleviated by Aleve; she just was nervous about her ultrasound and wanted to know the results.  No current pain.  She does report that she gets loose stools if the pain goes untreated for a long period of time.  Pain is mostly around her mid-abdomen and left side.  The following portions of the patient's history were reviewed and updated as appropriate: allergies, current medications, past family history, past medical history, past social history, past surgical history and problem list.   Objective:  Physical Exam Blood pressure 137/82, pulse 103, temperature 99 F (37.2 C), temperature source Oral, height 5\' 2"  (1.575 m), weight 75.978 kg (167 lb 8 oz). Deferred  Labs and Imaging 01/20/2011  *RADIOLOGY REPORT*  Clinical Data: Right sided pelvic pain. Postmenopausal.  TRANSABDOMINAL AND TRANSVAGINAL ULTRASOUND OF PELVIS Technique:  Both transabdominal and transvaginal ultrasound examinations of the pelvis were performed. Transabdominal technique was performed for global imaging of the pelvis including uterus, ovaries, adnexal regions, and pelvic cul-de-sac.  Comparison: None.   It was necessary to proceed with endovaginal exam following the transabdominal exam to visualize the myometrium, endometrium and adnexa.  Findings:  Uterus: Demonstrates a sagittal length of 7.3 cm, AP depth of 4.5 cm and a transverse width of 6.3 cm.  Overall resolution of the uterine myometrium endovaginally is somewhat compromised but no focal myometrial abnormality is identified.  Endometrium: Appears thin and echogenic with an AP width of 3.6 mm. No areas of focal thickening or heterogeneity are seen.  Right ovary:  Is not seen with confidence either transabdominally or endovaginally  Left ovary: Is not seen with confidence either transabdominally or endovaginally   Other findings: No pelvic fluid is seen  IMPRESSION: Normal postmenopausal uterine myometrium and endometrium.  Non- visualized ovaries.  Original Report Authenticated By: Bertha Stakes, M.D.    Assessment & Plan:  Patient reassured by result; informed there does not seem to be a GYN etiology for her pain.  She may need GI evaluation; needs to follow up with her PCP.  Advised to go to [facility] or [facility] ER with any further abdominal pain.  She can continue NSAIDs as needed.  Follow up for any further GYN concerns.

## 2011-01-26 NOTE — Telephone Encounter (Signed)
Pt left message asking for a nurse to call her back. No other details given

## 2011-01-26 NOTE — ED Notes (Signed)
Dr. Macon Large in with pt  ( in triage) discussing results of [place].

## 2011-01-26 NOTE — Progress Notes (Signed)
Pt states, " I've had pain in my low abdomen for approx 3 months, and I've been seen at the clinic and had an [place] last Thurs but don't have the results. I took aleve today 2 hrs ago and it has helped. I'm here because I want my results and I'm nervous and scared."

## 2011-01-27 ENCOUNTER — Telehealth: Payer: Self-pay | Admitting: *Deleted

## 2011-01-27 NOTE — Telephone Encounter (Signed)
Pt calls and states she has low abd pain, "burning" states aleve relieves it completely but in a few hours it comes back, worried it is "ulcers" she is informed she needs an appt, set for 12/21 at 1345. She may need to cancel and will call back if needed, she is informed the next available will be the end of next week

## 2011-01-28 ENCOUNTER — Encounter: Payer: Self-pay | Admitting: Internal Medicine

## 2011-01-29 ENCOUNTER — Encounter (HOSPITAL_COMMUNITY): Payer: Self-pay | Admitting: Emergency Medicine

## 2011-01-29 ENCOUNTER — Emergency Department (INDEPENDENT_AMBULATORY_CARE_PROVIDER_SITE_OTHER)
Admission: EM | Admit: 2011-01-29 | Discharge: 2011-01-29 | Disposition: A | Discharge status: Home / Self Care | Payer: Self-pay | Source: Home / Self Care | Attending: Family Medicine | Admitting: Family Medicine

## 2011-01-29 DIAGNOSIS — K589 Irritable bowel syndrome without diarrhea: Secondary | ICD-10-CM

## 2011-01-29 DIAGNOSIS — N281 Cyst of kidney, acquired: Secondary | ICD-10-CM

## 2011-01-29 DIAGNOSIS — F419 Anxiety disorder, unspecified: Secondary | ICD-10-CM

## 2011-01-29 DIAGNOSIS — F341 Dysthymic disorder: Secondary | ICD-10-CM

## 2011-01-29 LAB — POCT URINALYSIS DIP (DEVICE)
Bilirubin Urine: NEGATIVE
Glucose, UA: 100 mg/dL — AB
Nitrite: NEGATIVE
Urobilinogen, UA: 0.2 mg/dL (ref 0.0–1.0)

## 2011-01-29 MED ORDER — LORAZEPAM 1 MG PO TABS: 1.0000 mg | ORAL_TABLET | Freq: Three times a day (TID) | ORAL | Status: AC | PRN

## 2011-01-29 MED ORDER — GI COCKTAIL ~~LOC~~
30.0000 mL | Freq: Once | ORAL | Status: AC
  Administered 2011-01-29: 30 mL via ORAL

## 2011-01-29 MED ORDER — PANTOPRAZOLE SODIUM 20 MG PO TBEC: 40.0000 mg | DELAYED_RELEASE_TABLET | Freq: Every day | ORAL | Status: DC

## 2011-01-29 MED ORDER — GI COCKTAIL ~~LOC~~
ORAL | Status: AC
  Filled 2011-01-29: qty 30

## 2011-01-29 NOTE — ED Provider Notes (Signed)
History     CSN: [id number]  Arrival date & time 01/29/11  1559   First MD Initiated Contact with Patient 01/29/11 1611      No chief complaint on file.   (Consider location/radiation/quality/duration/timing/severity/associated sxs/prior treatment) Patient is a 55 y.o. female presenting with abdominal pain. The history is provided by the patient.  Abdominal Pain The primary symptoms of the illness include abdominal pain. The primary symptoms of the illness do not include fever, nausea, vomiting, diarrhea, dysuria, vaginal discharge or vaginal bleeding. Primary symptoms comment: recent pelvic eval by gyn last week, neg. Episode onset: long time, recent worrying, stress. The onset of the illness was gradual. The problem has not changed since onset. The patient states that she believes she is currently not pregnant. The patient has not had a change in bowel habit. Symptoms associated with the illness do not include chills, constipation, urgency, frequency or back pain.    Past Medical History  Diagnosis Date  . Diabetes mellitus type II, uncontrolled   . Hypertension   . Hyperlipidemia   . Ovarian cyst, left   . Anemia     due to menorrhagia, BL 8-10  . Vaginal cyst     nabothian and bartholin  . Chronic kidney disease     baseline creatinine 1.4-1.7  . Anxiety   . Depression   . Postmenopausal bleeding 06/12/2008    Past Surgical History  Procedure Date  . 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 Stroke Father   . Heart attack Father     Had MI in his 82s  . Stomach cancer Paternal Grandmother   . Diabetes Maternal Grandmother   . Cerebral palsy Daughter   . Anesthesia problems Neg Hx   . Hypotension Neg Hx   . Malignant hyperthermia Neg Hx   . Pseudochol deficiency Neg Hx     History  Substance Use Topics  . Smoking status: Never Smoker   . Smokeless tobacco: Former Neurosurgeon  . Alcohol Use: No      OB History    Grav Para Term Preterm Abortions TAB SAB Ect Mult Living   3 1 1  2  2   1       Review of Systems  Constitutional: Negative for fever and chills.  HENT: Negative.   Respiratory: Negative.   Cardiovascular: Negative.   Gastrointestinal: Positive for abdominal pain. Negative for nausea, vomiting, diarrhea and constipation.  Genitourinary: Negative for dysuria, urgency, frequency, vaginal bleeding and vaginal discharge.  Musculoskeletal: Negative for back pain.  Psychiatric/Behavioral: Positive for dysphoric mood.    Allergies  Review of patient's allergies indicates no known allergies.  Home Medications   Current Outpatient Rx  Name Route Sig Dispense Refill  . ASPIRIN 81 MG PO TABS Oral Take 1 tablet (81 mg total) by mouth daily.    . ENALAPRIL-HYDROCHLOROTHIAZIDE 5-12.5 MG PO TABS  TAKE 1 TABLET BY MOUTH ONCE DAILY 30 tablet 2    CYCLE FILL MEDICATION. Authorization is required f ...  . GLIPIZIDE 10 MG PO TABS Oral Take 1 tablet (10 mg total) by mouth 2 (two) times daily before a meal. 60 tablet 3    CYCLE FILL MEDICATION. Authorization is required f ...  . GLUCOSE BLOOD VI STRP  Check blood sugars 2-3 times a week before breakfast. 100 each 2  . LORAZEPAM 1 MG PO TABS Oral Take 1 tablet (  1 mg total) by mouth 3 (three) times daily as needed for anxiety. 15 tablet 0  . NAPROXEN SODIUM 220 MG PO TABS Oral Take 220 mg by mouth every 8 (eight) hours as needed. Patient takes for pain     . PANTOPRAZOLE SODIUM 20 MG PO TBEC Oral Take 2 tablets (40 mg total) by mouth daily. 30 tablet 1  . PRAVASTATIN SODIUM 40 MG PO TABS  TAKE ONE TABLET BY MOUTH DAILY 30 tablet 2    CYCLE FILL MEDICATION. Authorization is required f ...    BP 146/72  Pulse 110  Temp(Src) 98.3 F (36.8 C) (Oral)  Resp 18  SpO2 99%  Physical Exam  Nursing note and vitals reviewed. Constitutional: She appears well-developed and well-nourished.  HENT:  Head: Normocephalic.  Cardiovascular:  Normal rate, normal heart sounds and intact distal pulses.   Pulmonary/Chest: Effort normal and breath sounds normal.  Abdominal: Soft. Bowel sounds are normal. She exhibits no distension and no mass. There is no tenderness. There is no rebound and no guarding.  Skin: Skin is warm and dry.  Psychiatric: Her mood appears anxious. Her affect is labile. She exhibits a depressed mood.    ED Course  Procedures (including critical care time)  Labs Reviewed  POCT URINALYSIS DIP (DEVICE) - Abnormal; Notable for the following:    Glucose, UA 100 (*)    Hgb urine dipstick TRACE (*)    Leukocytes, UA MODERATE (*) Biochemical Testing Only. Please order routine urinalysis from main lab if confirmatory testing is needed.   All other components within normal limits  POCT URINALYSIS DIPSTICK   No results found.   1. Irritable bowel syndrome (IBS)   2. Anxiety and depression       MDM          Barkley Bruns, MD 01/29/11 1810

## 2011-01-29 NOTE — ED Notes (Signed)
Abdominal pain, low abdominal pain, c/o constipation and nausea.

## 2011-01-30 ENCOUNTER — Telehealth: Payer: Self-pay | Admitting: Internal Medicine

## 2011-01-30 NOTE — Telephone Encounter (Signed)
Called by [name] with concern about abdominal pain.  She was seen in the ED on 01/29/11 and given a GI cocktail that helped her pain get better.  She states that a few hours after leaving the ED she felt good but the pain returned.  She denies diarrhea, constipation, nausea, vomiting, fevers, or chills.  She has noticed that her anxiety is up and she has some "sour" taste in her mouth.    She is extremely anxious on the phone and after talking for a few minutes she settled down and states she felt much better.    Plan:  Peptobismal or Tums for the stomach discomfort Seen in clinic when able to.  She was informed that if she does not improve or gets worse she should be seen at the emergency department or Urgent care if she is unable to get into the clinic in reasonable time period.

## 2011-02-02 ENCOUNTER — Other Ambulatory Visit: Payer: Self-pay | Admitting: Internal Medicine

## 2011-02-02 MED ORDER — SIMVASTATIN 40 MG PO TABS: 40.0000 mg | ORAL_TABLET | Freq: Every evening | ORAL | Status: DC

## 2011-02-02 NOTE — Progress Notes (Signed)
Pt's recent FLP shows that her lipids are not adequately controlled for her target LDL < 70 given her hx of diabetes. I called her [facility], and it does seem that she has at least been regularly filling this medication on 11/23, 10/26. 0906, 08/09. Therefore, compliance does not seem to be an impediment.   I will escalate her therapy to Simvastatin 40mg , which I will send in to her pharmacy. I will ask the clinic nursing staff to call Ms. Helminiak tomorrow to inform her of this result and that I have electronically sent this prescription to her pharmacy.  I also request that the patient return to clinic in 6 weeks following initiation of this medication, so as we can recheck her LFTs.  [name], D.O.  02/02/2011, 5:13 PM

## 2011-02-03 NOTE — Progress Notes (Signed)
Pt called and no answer.  Message left for pt to call clinic

## 2011-02-03 NOTE — Progress Notes (Signed)
Spoke w/ pt, she was given the message, she is agreeable, she was then transferred to scheduler for ED f/u appt

## 2011-02-06 ENCOUNTER — Other Ambulatory Visit: Payer: Self-pay | Admitting: Internal Medicine

## 2011-02-07 ENCOUNTER — Other Ambulatory Visit: Payer: Self-pay | Admitting: Internal Medicine

## 2011-02-10 ENCOUNTER — Encounter: Payer: Self-pay | Admitting: Internal Medicine

## 2011-02-10 ENCOUNTER — Ambulatory Visit (INDEPENDENT_AMBULATORY_CARE_PROVIDER_SITE_OTHER): Payer: Self-pay | Admitting: Internal Medicine

## 2011-02-10 ENCOUNTER — Telehealth: Payer: Self-pay | Admitting: *Deleted

## 2011-02-10 DIAGNOSIS — E1165 Type 2 diabetes mellitus with hyperglycemia: Secondary | ICD-10-CM

## 2011-02-10 DIAGNOSIS — F419 Anxiety disorder, unspecified: Secondary | ICD-10-CM

## 2011-02-10 DIAGNOSIS — Z87898 Personal history of other specified conditions: Secondary | ICD-10-CM

## 2011-02-10 DIAGNOSIS — R32 Unspecified urinary incontinence: Secondary | ICD-10-CM

## 2011-02-10 DIAGNOSIS — Z87448 Personal history of other diseases of urinary system: Secondary | ICD-10-CM

## 2011-02-10 DIAGNOSIS — R109 Unspecified abdominal pain: Secondary | ICD-10-CM

## 2011-02-10 DIAGNOSIS — F411 Generalized anxiety disorder: Secondary | ICD-10-CM

## 2011-02-10 LAB — POCT URINALYSIS DIPSTICK
Glucose, UA: >=1000
Nitrite, UA: NEGATIVE
Protein, UA: NEGATIVE
Urobilinogen, UA: 0.2

## 2011-02-10 LAB — GLUCOSE, CAPILLARY: Glucose-Capillary: 402 mg/dL — ABNORMAL HIGH (ref 70–99)

## 2011-02-10 MED ORDER — SERTRALINE HCL 100 MG PO TABS: 100.0000 mg | ORAL_TABLET | Freq: Every day | ORAL | Status: DC

## 2011-02-10 NOTE — Assessment & Plan Note (Signed)
Patient was tearful and agitated on exam, speaking quickly, and expressing concerns about multiple topics. She describes decreased sleep, loss of interest, decreased energy, decreased appetite, all concerning for agitated depression and anxiety disorder. She denied SI.  I suspect a lot of her complaints are related to this anxiety and depression, and that her possible IBS, possible incontinence, and chronic abdominal pain may all be intertwined. Consequently, Dr. Coralee Pesa and I spent a long time discussing pharmacologic therapy with the patient. She was initially reluctant, but eventually agreed to start a course of Zoloft, targeted at both anxiety and depression. We explained that some of her physical symptoms could certainly be manifesting from these issues, and that this definitely needs to be treated. We will see her in 2-3 weeks to see how she is doing, but we explained that the full effect of medicine will be 4-6 weeks.  Because she has so many complaints and is so agitated, it is difficult to determine the extent and severity of her symptoms. Having a clean gynecologic exam is certainly comforting, but she needs a screening colonoscopy that will also shed some light on her chronic constipation and diffuse lower abdominal pain. Given her degree of anxiety, however, suspect a lot of the symptoms will subside if her depression/anxiety is treated properly. As her mood stabilizes, these problems can be more effectively triaged and worked up.

## 2011-02-10 NOTE — Telephone Encounter (Signed)
Pt calls c/o bladder leaking for "awhile", states it is now very dark and she is frightened. She states also that her abd problem is continuing. When ask about fevers she states "probably, i wake up sweating". Denies any other complaints. appt 1/3 at 1500

## 2011-02-10 NOTE — Assessment & Plan Note (Signed)
Patient presents with some incontinence and dark urine last night. UA today not convincing for urinary tract infection, and the patient denies dysuria or urinary frequency. She may have some small degree of stress or urge incontinence, but this is a secondary issue to her overwhelming anxiety and likely depression. We will target those problems before focusing on this one.

## 2011-02-10 NOTE — Telephone Encounter (Signed)
Agree 

## 2011-02-10 NOTE — Progress Notes (Signed)
Subjective:     Patient ID: [name], female   DOB: 1955-12-02, 56 y.o.   MRN: [id number]  HPI He should is a 56 year old woman with a history of  Patient Active Problem List  Diagnoses Date Noted  . Preventative health care 11/03/2010  . RENAL CYST, LEFT 01/15/2009  . URINARY INCONTINENCE 12/24/2008  . SEPTATE UTERUS 06/20/2008  . ANXIETY STATE, UNSPECIFIED 06/12/2008  . RENAL INSUFFICIENCY, CHRONIC 06/12/2008  . POSTMENOPAUSAL STATUS 05/23/2006  . Other and Unspecified Ovarian Cyst 01/18/2006  . Diabetes mellitus type II, uncontrolled 12/28/2005  . HYPERLIPIDEMIA 12/28/2005  . ANEMIA-NOS 12/28/2005  . HYPERTENSION 12/28/2005   She's been seen recently by a gynecologist for abdominal pain, and had a normal evaluation that included a negative pelvic ultrasound. This morning, she awoke with some urinary incontinence and dark urine that "scared" her.  She has had previous episodes of mild urinary incontinence, typically overnight with just a few drops. Patient denies dysuria, urinary frequency, fevers or chills.   She was seen in the ED recently for abdominal pain and told that she had IBS, anxiety, and depression. Patient does admit to significant stress and anxiety about taking care of her 54 year old disabled daughter who has extensive cardiac issues.  Patient says she has been sleeping poorly, does not have any solid interests in life, has decreased energy, poor appetite, but denies suicidality. Patient is agitated and tearful upon initial exam.   Review of Systems     Objective:   Physical Exam Physical Exam GEN: Tearful woman talking quickly.  Alert and oriented x 3.  Conversant and cooperative to exam. RESP:  CTAB, no w/r/r CARDIOVASCULAR: tachycardic, reg rhythm, 2/6 HSM at LUSB, S1, S2 ABDOMEN: soft, NT/ND, NABS EXT: warm and dry. No edema in b/l LE     Assessment:         Plan:

## 2011-02-10 NOTE — Assessment & Plan Note (Addendum)
Fingerstick glucose today was 407. Patient has poorly controlled diabetes on by mouth therapy only. See previous notes for further details on patient's reluctance to start insulin therapy. Diabetes control was not the primary purpose of this visit.

## 2011-02-11 ENCOUNTER — Telehealth: Payer: Self-pay | Admitting: *Deleted

## 2011-02-11 LAB — URINALYSIS, MICROSCOPIC ONLY
Casts: NONE SEEN
Crystals: NONE SEEN

## 2011-02-11 LAB — URINALYSIS, ROUTINE W REFLEX MICROSCOPIC
Bilirubin Urine: NEGATIVE
Hgb urine dipstick: NEGATIVE
Protein, ur: NEGATIVE mg/dL
Urobilinogen, UA: 0.2 mg/dL (ref 0.0–1.0)

## 2011-02-11 NOTE — Telephone Encounter (Signed)
I called and spoke with patient at length, and even though it only costs $3.99, she has decided not to start the sertaline at this time. I did tell her to think about it which she has said that she will. I did speak about the benefits to her with all of her stress taking care of her daughter with cp.

## 2011-02-11 NOTE — Telephone Encounter (Signed)
Pt calls this am rambling on and on about she doesn't know why "they" gave her that cholesterol medicine, if her sugar is up why make it worse so she is not going to take it also she is not going to take the zoloft, "i know i was bent out of shape yesterday but i'm not ... Well i'm not going to take it and you can tell them that, i liked both of them but that's not what i need, i think i need xrays and they need to be finding out about this stomach problem" pt was informed that she does not have to take anything that she doesn't feel comfortable with and that the mds would be informed. She then stated "well, what am i going to do about my cholesterol... Well, i guess it doesn't matter, i'm not going to take what they give me, if my sugar is up i don't need to make it worse, i did just eat before i came to that appt" i informed her that i would send a message to the mds and let them decide, i also reminded her of her coming appt 1/18. Call ended

## 2011-02-12 LAB — URINE CULTURE: Colony Count: 45000

## 2011-02-15 ENCOUNTER — Encounter: Payer: Self-pay | Admitting: Internal Medicine

## 2011-02-23 ENCOUNTER — Telehealth: Payer: Self-pay | Admitting: *Deleted

## 2011-02-23 NOTE — Telephone Encounter (Signed)
Pt left message requesting a call back ?

## 2011-02-24 NOTE — Telephone Encounter (Signed)
Called and spoke w/pt. She states that she is having another bartholin's cyst. She asked what she can do for the problem. I advised to sit in a tub of warm water several times daily or use warm water washcloths to the affected area. Pt asked if she needed an antibiotic. I explained that we cannot prescribe anything prior to her being evaluated. Pt stated that it is not really painful yet and is about the size of a quarter. I offered pt an appt @ clinic next week and she agreed. She was given apt for 02/28/11 @ 1515. If pt develops severe pain or significantly more swelling of the area before her visit,  she will go to [facility]U.  Pt voiced understanding.

## 2011-02-25 ENCOUNTER — Encounter: Payer: Self-pay | Admitting: Internal Medicine

## 2011-02-28 ENCOUNTER — Ambulatory Visit: Payer: Self-pay | Admitting: Advanced Practice Midwife

## 2011-03-07 ENCOUNTER — Encounter: Payer: Self-pay | Admitting: Internal Medicine

## 2011-03-31 ENCOUNTER — Encounter: Payer: Self-pay | Admitting: Internal Medicine

## 2011-04-14 ENCOUNTER — Other Ambulatory Visit: Payer: Self-pay | Admitting: Internal Medicine

## 2011-04-28 ENCOUNTER — Encounter: Payer: Self-pay | Admitting: Internal Medicine

## 2011-04-28 ENCOUNTER — Ambulatory Visit (INDEPENDENT_AMBULATORY_CARE_PROVIDER_SITE_OTHER): Payer: Self-pay | Admitting: Internal Medicine

## 2011-04-28 VITALS — BP 125/78 | HR 94 | Temp 96.7°F | Ht 64.0 in | Wt 165.2 lb

## 2011-04-28 DIAGNOSIS — I10 Essential (primary) hypertension: Secondary | ICD-10-CM

## 2011-04-28 DIAGNOSIS — Z23 Encounter for immunization: Secondary | ICD-10-CM

## 2011-04-28 DIAGNOSIS — E1165 Type 2 diabetes mellitus with hyperglycemia: Secondary | ICD-10-CM

## 2011-04-28 DIAGNOSIS — E119 Type 2 diabetes mellitus without complications: Secondary | ICD-10-CM

## 2011-04-28 DIAGNOSIS — IMO0002 Reserved for concepts with insufficient information to code with codable children: Secondary | ICD-10-CM

## 2011-04-28 DIAGNOSIS — Z79899 Other long term (current) drug therapy: Secondary | ICD-10-CM

## 2011-04-28 DIAGNOSIS — K219 Gastro-esophageal reflux disease without esophagitis: Secondary | ICD-10-CM

## 2011-04-28 DIAGNOSIS — Z0000 Encounter for general adult medical examination without abnormal findings: Secondary | ICD-10-CM

## 2011-04-28 DIAGNOSIS — E785 Hyperlipidemia, unspecified: Secondary | ICD-10-CM

## 2011-04-28 LAB — GLUCOSE, CAPILLARY: Glucose-Capillary: 249 mg/dL — ABNORMAL HIGH (ref 70–99)

## 2011-04-28 MED ORDER — GLUCOSE BLOOD VI STRP: ORAL_STRIP | Status: DC

## 2011-04-28 NOTE — Assessment & Plan Note (Addendum)
Pertinent Labs: Lab Results  Component Value Date   HGBA1C 11.7 04/28/2011   HGBA1C 9.0 11/11/2009   CREATININE 1.57* 12/23/2010   CREATININE 1.44* 12/24/2008   MICROALBUR 0.53 07/17/2007   MICRALBCREAT 7.3 07/17/2007   CHOL 206* 01/25/2011   HDL 36* 01/25/2011   TRIG 161* 01/25/2011    Assessment: Status: - Patient refuses insulin - states "I just don't want to start that right now", despite being explained complications that will arise with uncontrolled chronic disease. - Avoiding Metformin due to CKD - Never followed up with Chauncey Reading to see if she can get orange card, MAP - therefore, has not started Januvia yet.   Patient is compliant all of the time with prescribed medications of Glipizide 10mg  BID.   Disease Control: not controlled  Progress toward goals: deteriorated  Barriers to meeting goals: financial need, nonadherence to medications, lack of understanding of disease management and significant family stressors taking care of her daughter with CP.   Plan: Glucometer log was not reviewed today, as pt did not have glucometer available for review.     continue current medications  Pt refused to start insulin.  She is adamant she can achieve good blood sugar control with exercise and diet control.  We had a long discussion about severe adverse outcomes that can occur with continued poorly controlled chronic diseases.  reminded to bring blood glucose meter & log to each visit  Instructed to see DEB HILL IMMEDIATELY to start approval for orange card --> will plan to start Januvia if continued

## 2011-04-28 NOTE — Assessment & Plan Note (Addendum)
Health Maintenance  Topic Date Due  . Colonoscopy  04/22/2007  . Mammogram  07/30/2009  . Hemoglobin A1c  03/25/2011  . Pap Smear  08/08/2011  . Foot Exam  11/03/2011  . Influenza Vaccine  11/08/2011  . Ophthalmology Exam  11/10/2011  . Lipid Panel  01/25/2012  . Pneumococcal Polysaccharide Vaccine (#2) 11/03/2015  . Tetanus/tdap  04/21/1976    Assessment:  Due for mammogram (unclear if we had given her a scholarship previously - either way, she did not complete)  Due for colonoscopy - gave stool cards last visit with me. Did not complete.  Refused Tdap in past.  Plan:  Stool cards again given today.  Tdap today.  Will plan mammogram after gets orange card.

## 2011-04-28 NOTE — Assessment & Plan Note (Addendum)
Pertinent Data: BP Readings from Last 3 Encounters:  04/28/11 125/78  02/10/11 155/87  01/29/11 146/72    Basic Metabolic Panel:    Component Value Date/Time   NA 136 12/23/2010 1515   K 4.6 12/23/2010 1515   CL 97 12/23/2010 1515   CO2 29 12/23/2010 1515   BUN 39* 12/23/2010 1515   CREATININE 1.57* 12/23/2010 1515   CREATININE 1.44* 12/24/2008 2022   GLUCOSE 318* 12/23/2010 1515   CALCIUM 9.6 12/23/2010 1515    Assessment: Status: Well controlled, has started taking her medications regularly.  Patient is compliant all of the time with prescribed medications.   Disease Control: controlled  Progress toward goals: at goal  Barriers to meeting goals: financial need   Plan:  continue current medications  Check CMET today

## 2011-04-28 NOTE — Patient Instructions (Signed)
   Please follow-up at the clinic in 1 month, at which time we will reevaluate your diabetes, blood pressure - OR, please follow-up in the clinic sooner if needed.  Please start checking your blood sugars 2-3 times a week before breakfast and bring your glucometer with you.  There have not been changes in your medications = PLEASE MAKE SURE YOU START YOUR ASPIRIN 81 MG DAILY.   Please turn in your stool cards.  Please go see Chauncey Reading so you can try to get approved for orange card.   If symptoms worsen, or new symptoms arise, please call the clinic or go to the ER.  Please bring all of your medications in a bag to your next visit.

## 2011-04-28 NOTE — Assessment & Plan Note (Addendum)
Pertinent Labs: Liver Function Tests:    Component Value Date/Time   AST 27 12/23/2010 1515   ALT 25 12/23/2010 1515   ALKPHOS 118* 12/23/2010 1515   BILITOT 0.4 12/23/2010 1515   PROT 7.2 12/23/2010 1515   ALBUMIN 4.3 12/23/2010 1515    Lipid Panel:     Component Value Date/Time   CHOL 206* 01/25/2011 1015   TRIG 301* 01/25/2011 1015   HDL 36* 01/25/2011 1015   CHOLHDL 5.7 01/25/2011 1015   VLDL 60* 01/25/2011 1015   LDLCALC 110* 01/25/2011 1015     Assessment: Status: Was changed to Simvastatin in 01/2011 because of suboptimal LDL control. Patient is not fasting today. Patient is compliant all of the time with prescribed medications - which is new for her..   Disease Control: not controlled  Progress toward goals: unable to assess  Barriers to meeting goals: financial need, prior med noncompliance.   Plan:  continue current medications  Check lipid panel today - to assess if adequate control with the simvastatin.  Check CMET today.

## 2011-04-28 NOTE — Progress Notes (Signed)
  Subjective:    Patient ID: [name] female   DOB: 1955/02/16 56 y.o.   MRN: [id number]  HPI: Pt is a 56 y.o. female who  has a past medical history of Diabetes mellitus type II, uncontrolled, hypertension, hyperlipidemia, and chronic kidney disease. and presents to clinic today for the following:  1) DM, II - last A1c 10.5 (12/2010) --> 11.7 today- Patient checking blood sugars 0 times daily. Currently taking glipizide, 10mg  BID. Confirms nocturia, denies, polydipsia, nausea, vomiting, diarrhea.  does not request refills of glipizide but does need test strips. Refuses insulin, states she is not willing to take that at this point - will not explain further.  2) HTN - Patient does not check blood pressure regularly at home. Currently taking Enalapril-HCTZ - states that she is taking it regularly without missing doses. Denies headaches, dizziness, lightheadedness, chest pain, shortness of breath.  Does not need refills.  3) HLD - Currently taking Simvastatin 40mg  - states that she is taking it regularly without missing doses.  denies chest pain, difficulty breathing, palpitations, tachycardia, and muscle pains. does not request refills today. Has not started aspirin yet.  4) Preventative Health Care - still has not followed up with [facility]un[facility] to see if she can get orange card approval. Was given mammogram scholarship, did not complete. Was given stool cards, did not return. Refused tdap last visit.    Review of Systems: Per HPI.  Current Outpatient Medications: Medication Sig  . Enalapril-Hydrochlorothiazide 5-12.5 MG per tablet TAKE 1 TABLET BY MOUTH ONCE DAILY  . glipiZIDE (GLUCOTROL) 10 MG tablet Take 1 tablet (10 mg total) by mouth 2 (two) times daily before a meal.  . glipiZIDE (GLUCOTROL) 10 MG tablet TAKE ONE TABLET (10MG ) BY MOUTH TWICE DAILY PRIOR TO MEALS  . glucose blood test strip Check blood sugars 2-3 times a week before breakfast.  . ranitidine (ZANTAC) 150 MG tablet Take 150  mg by mouth 2 (two) times daily.  . simvastatin (ZOCOR) 40 MG tablet Take 1 tablet (40 mg total) by mouth every evening.  Marland Kitchen aspirin 81 MG tablet Take 1 tablet (81 mg total) by mouth daily.    Allergies: No Known Allergies  Past Medical History  Diagnosis Date  . Diabetes mellitus type II, uncontrolled   . Hypertension   . Hyperlipidemia   . Ovarian cyst, left   . Anemia     due to menorrhagia, BL 8-10  . Vaginal cyst     nabothian and bartholin  . Chronic kidney disease     baseline creatinine 1.4-1.7  . Anxiety   . Depression   . Postmenopausal bleeding 06/12/2008    Past Surgical History  Procedure Date  . Cardiac surgery     to repair congenital defect as a child     Objective:    Physical Exam: Filed Vitals:   04/28/11 1330  BP: 125/78  Pulse: 94  Temp: 96.7 F (35.9 C)      General: Vital signs reviewed and noted. Well-developed, well-nourished, in no acute distress; alert, appropriate and cooperative throughout examination.  Head: Normocephalic, atraumatic.  Lungs:  Normal respiratory effort. Clear to auscultation BL without crackles or wheezes.  Heart: RRR. S1 and S2 normal without gallop, rubs. (+) systolic murmur.  Abdomen:  BS normoactive. Soft, Nondistended, non-tender.  No masses or organomegaly.  Extremities: No pretibial edema.    Assessment/ Plan:   Case and plan of care discussed with Dr. Margarito Liner.

## 2011-04-29 LAB — LIPID PANEL
Cholesterol: 192 mg/dL (ref 0–200)
HDL: 39 mg/dL — ABNORMAL LOW (ref >39)
LDL Cholesterol: 79 mg/dL (ref 0–99)
Total CHOL/HDL Ratio: 4.9 Ratio
Triglycerides: 372 mg/dL — ABNORMAL HIGH (ref <150)
VLDL: 74 mg/dL — ABNORMAL HIGH (ref 0–40)

## 2011-04-29 LAB — MICROALBUMIN / CREATININE URINE RATIO: Microalb, Ur: 0.5 mg/dL (ref 0.00–1.89)

## 2011-04-29 LAB — COMPREHENSIVE METABOLIC PANEL
ALT: 33 U/L (ref 0–35)
CO2: 26 mEq/L (ref 19–32)
Calcium: 9.9 mg/dL (ref 8.4–10.5)
Chloride: 97 mEq/L (ref 96–112)
Sodium: 136 mEq/L (ref 135–145)
Total Bilirubin: 0.5 mg/dL (ref 0.3–1.2)
Total Protein: 7.3 g/dL (ref 6.0–8.3)

## 2011-05-14 ENCOUNTER — Other Ambulatory Visit: Payer: Self-pay | Admitting: Internal Medicine

## 2011-05-27 ENCOUNTER — Encounter: Payer: Self-pay | Admitting: Internal Medicine

## 2011-06-09 ENCOUNTER — Encounter: Payer: Self-pay | Admitting: Internal Medicine

## 2011-06-11 ENCOUNTER — Other Ambulatory Visit: Payer: Self-pay | Admitting: Internal Medicine

## 2011-07-19 ENCOUNTER — Inpatient Hospital Stay (HOSPITAL_COMMUNITY)
Admission: EM | Admit: 2011-07-19 | Discharge: 2011-07-20 | DRG: 379 | Disposition: A | Discharge status: Home / Self Care | Payer: Self-pay | Source: Ambulatory Visit | Attending: Infectious Disease | Admitting: Infectious Disease

## 2011-07-19 ENCOUNTER — Encounter (HOSPITAL_COMMUNITY): Payer: Self-pay | Admitting: Emergency Medicine

## 2011-07-19 DIAGNOSIS — I129 Hypertensive chronic kidney disease with stage 1 through stage 4 chronic kidney disease, or unspecified chronic kidney disease: Secondary | ICD-10-CM | POA: Diagnosis present

## 2011-07-19 DIAGNOSIS — E785 Hyperlipidemia, unspecified: Secondary | ICD-10-CM | POA: Diagnosis present

## 2011-07-19 DIAGNOSIS — K922 Gastrointestinal hemorrhage, unspecified: Secondary | ICD-10-CM

## 2011-07-19 DIAGNOSIS — R000 Tachycardia, unspecified: Secondary | ICD-10-CM | POA: Diagnosis present

## 2011-07-19 DIAGNOSIS — K625 Hemorrhage of anus and rectum: Principal | ICD-10-CM | POA: Diagnosis present

## 2011-07-19 DIAGNOSIS — F329 Major depressive disorder, single episode, unspecified: Secondary | ICD-10-CM | POA: Diagnosis present

## 2011-07-19 DIAGNOSIS — IMO0001 Reserved for inherently not codable concepts without codable children: Secondary | ICD-10-CM | POA: Diagnosis present

## 2011-07-19 DIAGNOSIS — E1165 Type 2 diabetes mellitus with hyperglycemia: Secondary | ICD-10-CM

## 2011-07-19 DIAGNOSIS — E1159 Type 2 diabetes mellitus with other circulatory complications: Secondary | ICD-10-CM | POA: Diagnosis present

## 2011-07-19 DIAGNOSIS — F3289 Other specified depressive episodes: Secondary | ICD-10-CM | POA: Diagnosis present

## 2011-07-19 DIAGNOSIS — F411 Generalized anxiety disorder: Secondary | ICD-10-CM | POA: Diagnosis present

## 2011-07-19 DIAGNOSIS — I498 Other specified cardiac arrhythmias: Secondary | ICD-10-CM | POA: Diagnosis present

## 2011-07-19 DIAGNOSIS — N183 Chronic kidney disease, stage 3 unspecified: Secondary | ICD-10-CM | POA: Diagnosis present

## 2011-07-19 DIAGNOSIS — E1122 Type 2 diabetes mellitus with diabetic chronic kidney disease: Secondary | ICD-10-CM | POA: Diagnosis present

## 2011-07-19 DIAGNOSIS — K529 Noninfective gastroenteritis and colitis, unspecified: Secondary | ICD-10-CM

## 2011-07-19 LAB — BASIC METABOLIC PANEL
Chloride: 95 mEq/L — ABNORMAL LOW (ref 96–112)
Creatinine, Ser: 1.52 mg/dL — ABNORMAL HIGH (ref 0.50–1.10)
GFR calc Af Amer: 44 mL/min — ABNORMAL LOW (ref >90)
GFR calc non Af Amer: 38 mL/min — ABNORMAL LOW (ref >90)

## 2011-07-19 LAB — POCT I-STAT 3, VENOUS BLOOD GAS (G3P V): O2 Saturation: 29 %

## 2011-07-19 LAB — URINALYSIS, ROUTINE W REFLEX MICROSCOPIC
Ketones, ur: 15 mg/dL — AB
Nitrite: NEGATIVE
Protein, ur: NEGATIVE mg/dL
Urobilinogen, UA: 0.2 mg/dL (ref 0.0–1.0)
pH: 5 (ref 5.0–8.0)

## 2011-07-19 LAB — URINE MICROSCOPIC-ADD ON

## 2011-07-19 LAB — POCT I-STAT TROPONIN I: Troponin i, poc: 0 ng/mL (ref 0.00–0.08)

## 2011-07-19 LAB — POCT I-STAT, CHEM 8
BUN: 36 mg/dL — ABNORMAL HIGH (ref 6–23)
Chloride: 97 mEq/L (ref 96–112)
HCT: 43 % (ref 36.0–46.0)
Sodium: 136 mEq/L (ref 135–145)
TCO2: 27 mmol/L (ref 0–100)

## 2011-07-19 MED ORDER — SODIUM CHLORIDE 0.9 % IV BOLUS (SEPSIS)
1000.0000 mL | Freq: Once | INTRAVENOUS | Status: AC
  Administered 2011-07-19: 1000 mL via INTRAVENOUS

## 2011-07-19 MED ORDER — ONDANSETRON HCL 4 MG/2ML IJ SOLN
4.0000 mg | Freq: Once | INTRAMUSCULAR | Status: AC
  Administered 2011-07-19: 4 mg via INTRAVENOUS

## 2011-07-19 MED ORDER — ONDANSETRON HCL 4 MG/2ML IJ SOLN
INTRAMUSCULAR | Status: AC
  Filled 2011-07-19: qty 2

## 2011-07-19 MED ORDER — FENTANYL CITRATE 0.05 MG/ML IJ SOLN
INTRAMUSCULAR | Status: AC
  Administered 2011-07-19: 100 ug via INTRAVENOUS
  Filled 2011-07-19: qty 2

## 2011-07-19 MED ORDER — FENTANYL CITRATE 0.05 MG/ML IJ SOLN
100.0000 ug | Freq: Once | INTRAMUSCULAR | Status: AC
  Administered 2011-07-19: 100 ug via INTRAVENOUS

## 2011-07-19 MED ORDER — PROMETHAZINE HCL 25 MG/ML IJ SOLN
12.5000 mg | Freq: Once | INTRAMUSCULAR | Status: AC
  Administered 2011-07-19: 12.5 mg via INTRAVENOUS
  Filled 2011-07-19: qty 1

## 2011-07-19 MED ORDER — ONDANSETRON HCL 4 MG/2ML IJ SOLN
4.0000 mg | Freq: Once | INTRAMUSCULAR | Status: AC
  Administered 2011-07-19: 4 mg via INTRAVENOUS
  Filled 2011-07-19: qty 2

## 2011-07-19 NOTE — ED Notes (Signed)
Pt assisted to bedside commode - bowel movement.

## 2011-07-19 NOTE — ED Notes (Signed)
Dr. Rainey Pines made aware that pt is having loose stools with bright red blood in them. Will continue to monitor.

## 2011-07-19 NOTE — ED Notes (Signed)
EDP stated that pt can now have oral contrast. Oral contrast and Zofran given to pt. Will continue to monitor.

## 2011-07-19 NOTE — ED Provider Notes (Signed)
History     CSN: [id number]  Arrival date & time 07/19/11  1704   First MD Initiated Contact with Patient 07/19/11 1722      Chief Complaint  Patient presents with  . Near Syncope    (Consider location/radiation/quality/duration/timing/severity/associated sxs/prior treatment) HPI  56 year old female history of type 2 diabetes, hypertension, hyperlipidemia, chronic kidney disease, presents presenting today status post near-syncopal episode. Patient was straining at the stool at a laund[employer]at, and came out and near syncopal episode witnessed by her daughter. She denies any chest pain, jaw pain, shortness of breath, diaphoresis, headache, confusion, weakness, numbness.  She was diaphoretic when EMS arrived however. No recent illnesses or sick contacts she has no complaints right now other than being unable to have a bowel movement since this morning early. Vital signs stable on arrival.    Past Medical History  Diagnosis Date  . Diabetes mellitus type II, uncontrolled   . Hypertension   . Hyperlipidemia   . Ovarian cyst, left   . Anemia     due to menorrhagia, BL 8-10  . Vaginal cyst     nabothian and bartholin  . CKD (chronic kidney disease) stage 3, GFR 30-59 ml/min     baseline creatinine 1.4-1.7  . Anxiety   . Depression   . Postmenopausal bleeding 06/12/2008  . 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 Stroke Father   . Heart attack Father     Had MI in his 1s  . Stomach cancer Paternal Grandmother   . Diabetes Maternal Grandmother   . Cerebral palsy Daughter   . Anesthesia problems Neg Hx   . Hypotension Neg Hx   . Malignant hyperthermia Neg Hx   . Pseudochol deficiency Neg Hx     History  Substance Use Topics  . Smoking status: Never Smoker   . Smokeless tobacco: Former Neurosurgeon  .  Alcohol Use: No    OB History    Grav Para Term Preterm Abortions TAB SAB Ect Mult Living   3 1 1  2  2   1       Review of Systems Constitutional: Negative for fever and chills.  HENT: Negative for ear pain, sore throat and trouble swallowing.   Eyes: Negative for pain and visual disturbance.  Respiratory: Negative for cough and shortness of breath.   Cardiovascular: Negative for chest pain and leg swelling.  Gastrointestinal: Negative for nausea, vomiting, abdominal pain and diarrhea.  Genitourinary: Negative for dysuria, urgency and frequency.  Musculoskeletal: Negative for back pain and joint swelling.  Skin: Negative for rash and wound.  Neurological: Negative for dizziness, POS near-syncope, speech difficulty, weakness and numbness.     Allergies  Review of patient's allergies indicates no known allergies.  Home Medications   Current Outpatient Rx  Name Route Sig Dispense Refill  . ASPIRIN 81 MG PO TABS Oral Take 1 tablet (81 mg total) by mouth daily.    . ENALAPRIL-HYDROCHLOROTHIAZIDE 5-12.5 MG PO TABS  TAKE 1 TABLET BY MOUTH ONCE DAILY 30 tablet 5    CYCLE FILL MEDICATION. Authorization is required f ...  . GLIPIZIDE 10 MG PO TABS Oral Take 1 tablet (10 mg total) by mouth 2 (two) times daily before a meal. 60 tablet 3  CYCLE FILL MEDICATION. Authorization is required f ...  . GLIPIZIDE 10 MG PO TABS  TAKE ONE TABLET (10MG ) BY MOUTH TWICE DAILY PRIOR TO MEALS 60 tablet 5    CYCLE FILL MEDICATION. Authorization is required f ...  . GLUCOSE BLOOD VI STRP  Check blood sugars 2-3 times a week before breakfast. 100 each 11  . RANITIDINE HCL 150 MG PO TABS Oral Take 150 mg by mouth 2 (two) times daily.    Marland Kitchen SIMVASTATIN 40 MG PO TABS  TAKE ONE TABLET BY MOUTH DAILY IN THE EVENING 30 tablet 2    CYCLE FILL MEDICATION. Authorization is required f ...    BP 109/92  Pulse 103  Temp(Src) 97.6 F (36.4 C) (Oral)  Resp 14  SpO2 96%  Physical Exam Consitutional: Pt in no  acute distress.   Head: Normocephalic and atraumatic.  Eyes: Extraocular motion intact, no scleral icterus Neck: Supple without meningismus, mass, or overt JVD Respiratory: Effort normal and breath sounds normal. No respiratory distress. CV: Heart regular rate and rhythm, no obvious murmurs.  Pulses +2 and symmetric Abdomen: Soft, non-tender, non-distended MSK: Extremities are atraumatic without deformity, ROM intact Skin: Warm, dry, intact Neuro: Alert and oriented, no motor deficit noted.  PERRL.   CNs 2-12 normal.  Psychiatric: Mood and affect are normal    ED Course  Procedures (including critical care time)  Labs Reviewed - No data to display No results found.   No diagnosis found.  EKG:  Rate: 104 Rythym Sinus tach Interval 195 ms. Axis: normal RBBB  No gross ST or T-wave abnormalities appreciated.  Minimal change from previous.    MDM  Patient was an excellent story for vagal syncope she had a syncopal episode minutes after a long strenuous attempted bowel movement. No symptoms to suggest a CNS. No history of heart disease. She arrived here he may be stable, fluids were ordered. However pt proceeded to have one episode of emesis. I thus initiated blood work.  Initial blood work indicated hyperglycemia there was of course concern for DKA. Venous blood gas and for that reason, as well as basic metabolic panel.  Fluid therapy ongoing.     VBG not consistent with DKA. However the patient began to have abdominal pain during her time here in the emergency department. She was given fentanyl for pain.  She was eventually scanned with by mouth contrast.  The scan demonstrated a mild colitis. Nursing began to report some bloody stools as well. Finally her heart rate began to increase, significantly so. He was hovering around 120 beats per minute. Initial hemoglobin was normal.  This patient will need admission to the medicine service to further monitor this gastrointestinal  bleeding at least until her tachycardia resolves.  Patient admitted to medicine uneventfully.        Larrie Kass, MD 07/20/11 0200

## 2011-07-19 NOTE — ED Notes (Signed)
Received report from [facility]. Pt came into the ED because she 2 near syncopal episodes at home. No complete LOC. She is now complaining of lower abdominal pain. She is also having nausea and vomiting. Will continue to monitor.

## 2011-07-19 NOTE — ED Notes (Signed)
Radiology aware that pt completed contrast.

## 2011-07-19 NOTE — ED Notes (Signed)
Pt arrived by EMS. EMS reports near syncopal episode today at the laundry matt. Had another near syncopal episode once with EMS. Pt. C/o of abd pain and needing to have bowel movement. Pt. Is diaphoretic. And feeling weak. Pt. Denies headache.

## 2011-07-19 NOTE — ED Notes (Signed)
Pt assisted to bedside commode. Pt had bowel movement.

## 2011-07-19 NOTE — ED Notes (Signed)
Pts daughter states that EMS has her cell phone. Information relayed to [employer] and Loraine Leriche at [facility] will call RN Maralyn Sago regarding information.

## 2011-07-19 NOTE — ED Notes (Signed)
EDP stated that he does not want pt to have IV or Oral Contrast. Radiology made aware.

## 2011-07-19 NOTE — ED Notes (Signed)
Pt. Began vomiting x1. MD made aware.

## 2011-07-19 NOTE — ED Notes (Signed)
CBG 307. Pt. States she feels somewhat better after having bowel movement.

## 2011-07-20 ENCOUNTER — Encounter (HOSPITAL_COMMUNITY): Payer: Self-pay | Admitting: *Deleted

## 2011-07-20 ENCOUNTER — Emergency Department (HOSPITAL_COMMUNITY): Payer: Self-pay

## 2011-07-20 DIAGNOSIS — R000 Tachycardia, unspecified: Secondary | ICD-10-CM | POA: Diagnosis present

## 2011-07-20 DIAGNOSIS — K625 Hemorrhage of anus and rectum: Secondary | ICD-10-CM | POA: Diagnosis present

## 2011-07-20 LAB — CBC
HCT: 33.6 % — ABNORMAL LOW (ref 36.0–46.0)
HCT: 33.8 % — ABNORMAL LOW (ref 36.0–46.0)
Hemoglobin: 12 g/dL (ref 12.0–15.0)
Hemoglobin: 11.5 g/dL — ABNORMAL LOW (ref 12.0–15.0)
MCH: 30 pg (ref 26.0–34.0)
MCHC: 35.5 g/dL (ref 30.0–36.0)
MCV: 85.5 fL (ref 78.0–100.0)
Platelets: 157 10*3/uL (ref 150–400)
Platelets: 179 10*3/uL (ref 150–400)
RBC: 3.75 MIL/uL — ABNORMAL LOW (ref 3.87–5.11)
RDW: 11.9 % (ref 11.5–15.5)
RDW: 12 % (ref 11.5–15.5)
WBC: 16.7 10*3/uL — ABNORMAL HIGH (ref 4.0–10.5)
WBC: 13.9 10*3/uL — ABNORMAL HIGH (ref 4.0–10.5)

## 2011-07-20 LAB — GLUCOSE, CAPILLARY
Glucose-Capillary: 420 mg/dL — ABNORMAL HIGH (ref 70–99)
Glucose-Capillary: 151 mg/dL — ABNORMAL HIGH (ref 70–99)

## 2011-07-20 LAB — COMPREHENSIVE METABOLIC PANEL
ALT: 29 U/L (ref 0–35)
Alkaline Phosphatase: 103 U/L (ref 39–117)
BUN: 36 mg/dL — ABNORMAL HIGH (ref 6–23)
CO2: 24 mEq/L (ref 19–32)
Calcium: 8.6 mg/dL (ref 8.4–10.5)
GFR calc Af Amer: 38 mL/min — ABNORMAL LOW (ref >90)
GFR calc non Af Amer: 33 mL/min — ABNORMAL LOW (ref >90)
Glucose, Bld: 443 mg/dL — ABNORMAL HIGH (ref 70–99)
Sodium: 132 mEq/L — ABNORMAL LOW (ref 135–145)
Total Protein: 6.4 g/dL (ref 6.0–8.3)

## 2011-07-20 MED ORDER — PANTOPRAZOLE SODIUM 40 MG IV SOLR
40.0000 mg | Freq: Every day | INTRAVENOUS | Status: DC
  Filled 2011-07-20: qty 40

## 2011-07-20 MED ORDER — SODIUM CHLORIDE 0.9 % IV SOLN
INTRAVENOUS | Status: DC
  Administered 2011-07-20: 11:00:00 via INTRAVENOUS

## 2011-07-20 MED ORDER — SODIUM CHLORIDE 0.9 % IV SOLN
INTRAVENOUS | Status: AC
  Administered 2011-07-20 (×2): via INTRAVENOUS

## 2011-07-20 MED ORDER — GI COCKTAIL ~~LOC~~
30.0000 mL | Freq: Once | ORAL | Status: AC
  Administered 2011-07-20: 30 mL via ORAL

## 2011-07-20 MED ORDER — ACETAMINOPHEN 325 MG PO TABS: 650.0000 mg | ORAL_TABLET | Freq: Four times a day (QID) | ORAL | Status: DC | PRN

## 2011-07-20 MED ORDER — IOHEXOL 300 MG/ML  SOLN
40.0000 mL | Freq: Once | INTRAMUSCULAR | Status: AC | PRN
  Administered 2011-07-20: 40 mL via ORAL

## 2011-07-20 MED ORDER — ONDANSETRON HCL 4 MG/2ML IJ SOLN: 4.0000 mg | Freq: Four times a day (QID) | INTRAMUSCULAR | Status: DC | PRN

## 2011-07-20 MED ORDER — ONDANSETRON HCL 4 MG PO TABS: 4.0000 mg | ORAL_TABLET | Freq: Four times a day (QID) | ORAL | Status: DC | PRN

## 2011-07-20 MED ORDER — INSULIN ASPART 100 UNIT/ML ~~LOC~~ SOLN
0.0000 [IU] | SUBCUTANEOUS | Status: DC
  Administered 2011-07-20: 9 [IU] via SUBCUTANEOUS
  Administered 2011-07-20: 2 [IU] via SUBCUTANEOUS
  Administered 2011-07-20: 5 [IU] via SUBCUTANEOUS

## 2011-07-20 MED ORDER — ACETAMINOPHEN 650 MG RE SUPP: 650.0000 mg | Freq: Four times a day (QID) | RECTAL | Status: DC | PRN

## 2011-07-20 MED ORDER — SIMVASTATIN 40 MG PO TABS
40.0000 mg | ORAL_TABLET | Freq: Every evening | ORAL | Status: DC
  Filled 2011-07-20: qty 1

## 2011-07-20 MED ORDER — SODIUM CHLORIDE 0.9 % IJ SOLN
3.0000 mL | Freq: Two times a day (BID) | INTRAMUSCULAR | Status: DC
  Administered 2011-07-20: 3 mL via INTRAVENOUS

## 2011-07-20 MED ORDER — PANTOPRAZOLE SODIUM 40 MG IV SOLR
40.0000 mg | Freq: Every day | INTRAVENOUS | Status: DC
  Administered 2011-07-20: 40 mg via INTRAVENOUS
  Filled 2011-07-20: qty 40

## 2011-07-20 MED ORDER — GI COCKTAIL ~~LOC~~
ORAL | Status: AC
  Filled 2011-07-20: qty 30

## 2011-07-20 NOTE — Discharge Instructions (Signed)
Please come to clinic next Tuesday for your appointment. We do think that you need to start insulin. We will talk about that when you come next week and we can start that then. Continue to take the stomach medicine ranitidine for about two weeks. You will need a colonoscopy to check for bleeding or polyps soon. We think the blood loss and stress helped cause the almost fainting you had at the [facility].

## 2011-07-20 NOTE — Progress Notes (Signed)
Utilization Review Completed.Dorcas Carrow T6/01/2012

## 2011-07-20 NOTE — Progress Notes (Signed)
Subjective: The patient was sitting up in bed and pleasant. Did answer a lot of her questions about her disease and what was happening today. Did discuss her stable blood counts and her diabetes. She did have some blood on the tissue when wiping but no more bloody bowel movements overnight. No fevers/chills/chest pain/SOB. She does talk to me a lot about her caregiver stress with her mother and her daughter. Her daughter has disability from gb syndrome.   Objective: Vital signs in last 24 hours: Filed Vitals:   07/20/11 0350 07/20/11 0351 07/20/11 0547 07/20/11 0944  BP: 98/59 123/72 134/69 128/78  Pulse: 110 114 93 102  Temp:   99.4 F (37.4 C) 98.5 F (36.9 C)  TempSrc:   Oral Oral  Resp: 17 18 17 20   Weight:      SpO2: 95% 95% 94% 98%   Weight change:   Intake/Output Summary (Last 24 hours) at 07/20/11 1150 Last data filed at 07/20/11 1100  Gross per 24 hour  Intake      0 ml  Output   1075 ml  Net  -1075 ml   General: resting in bed HEENT: PERRL, EOMI, no scleral icterus Cardiac: RRR, no rubs, murmurs or gallops Pulm: clear to auscultation bilaterally, moving normal volumes of air Abd: soft, nontender, nondistended, BS present Ext: warm and well perfused, no pedal edema Neuro: alert and oriented X3, cranial nerves II-XII grossly intact   Lab Results: Basic Metabolic Panel:  Lab 07/20/11 4540 07/19/11 1914  NA 132* 134*  K 4.8 5.0  CL 96 95*  CO2 24 26  GLUCOSE 443* 323*  BUN 36* 35*  CREATININE 1.70* 1.52*  CALCIUM 8.6 9.4  MG -- --  PHOS -- --   Liver Function Tests:  Lab 07/20/11 0337  AST 23  ALT 29  ALKPHOS 103  BILITOT 0.6  PROT 6.4  ALBUMIN 3.6   CBC:  Lab 07/20/11 0815 07/20/11 0337  WBC 16.7* 15.9*  NEUTROABS -- --  HGB 12.0 11.8*  HCT 33.8* 33.6*  MCV 85.8 85.5  PLT 197 157   CBG:  Lab 07/20/11 0741 07/20/11 0545 07/20/11 0329 07/19/11 1855  GLUCAP 280* 354* 420* 307*   Micro Results: No results found for this or any previous  visit (from the past 240 hour(s)). Studies/Results: Ct Abdomen Pelvis Wo Contrast  07/20/2011  *RADIOLOGY REPORT*  Clinical Data: Abdominal pain  CT ABDOMEN AND PELVIS WITHOUT CONTRAST  Technique:  Multidetector CT imaging of the abdomen and pelvis was performed following the standard protocol without intravenous contrast.  Comparison: 12/29/2008  Findings: Bibasilar opacities.  Heart size upper normal to mildly enlarged.  No pleural or pericardial effusion.  Organ abnormality/lesion detection is limited in the absence of intravenous contrast. Within this limitation, unremarkable liver, spleen, adrenal glands.  Cannot exclude gallstones or gallbladder wall thickening.  No pericholecystic fluid.  Atrophy versus agenesis of the body/tail of the pancreas.  Unchanged appearance to the head.  Unremarkable adrenal glands.  There are bilateral renal cysts of varying complexity.  Layering calcium versus wall calcium involving interpolar cyst on the right. No hydronephrosis or hydroureter.  Decompressed colon limits evaluation.  There is mild pericolonic fat stranding.  Fatty infiltration of the submucosa suggest sequelae of prior colitis.  Normal appendix.  No free intraperitoneal air or fluid.  No lymphadenopathy.  Normal caliber vasculature.  Thin-walled bladder.  Suggestion of bicornuate uterus.  Limited evaluation of the adnexa by noncontrast CT.  Multilevel degenerative changes of the  imaged spine. No acute or aggressive appearing osseous lesion.  IMPRESSION: Limited examination in the absence of intravenous contrast.  The colon is decompressed however allowing for this, there is suggestion of mild wall thickening / pericolonic fat stranding as can be seen with a nonspecific colitis (infectious, inflammatory, and ischemic considerations).  Bilateral renal lesions of varying complexity.  Note that the previous MRI in 2010 recommend an MR follow-up.  This is again recommended.  Original Report Authenticated By: Waneta Martins, M.D.   Medications: I have reviewed the patient's current medications. Scheduled Meds:   . fentaNYL  100 mcg Intravenous Once  . gi cocktail  30 mL Oral Once  . insulin aspart  0-9 Units Subcutaneous Q4H  . ondansetron (ZOFRAN) IV  4 mg Intravenous Once  . ondansetron (ZOFRAN) IV  4 mg Intravenous Once  . pantoprazole (PROTONIX) IV  40 mg Intravenous Daily  . promethazine  12.5 mg Intravenous Once  . simvastatin  40 mg Oral QPM  . sodium chloride  1,000 mL Intravenous Once  . sodium chloride  1,000 mL Intravenous Once  . sodium chloride  3 mL Intravenous Q12H  . DISCONTD: pantoprazole (PROTONIX) IV  40 mg Intravenous QHS   Continuous Infusions:   . sodium chloride 250 mL/hr at 07/20/11 0824  . sodium chloride 125 mL/hr at 07/20/11 1112   PRN Meds:.acetaminophen, acetaminophen, iohexol, ondansetron (ZOFRAN) IV, ondansetron Assessment/Plan:  Bright red blood per rectum - Mostyl resolved and may have been secondary to some gastritis or hemorrhoids. No significant loss of Hg during this stay and will have close follow up. She would like to pursue GI workup as an out-patient. She was instructed to call a doctor or seek medical attention if this recurs. There was only streaks of blood on the stool, no melena or tarry stools.   Diabetes mellitus type II, uncontrolled - Did strongly counsel the patient to start insulin at this visit and talked to her extensively about insulin 70/30. Will recommend starting at out-patient visit.   HYPERTENSION - Controlled during this stay on home medications.    Sinus tachycardia - Likely secondary to fluid depletion given diabetes and slight blood loss, also anxiety. Did resolve with fluids overnight. Stable now.   Discharge - Hg stable overnight and will discharge with close follow up at our clinic. GI workup as out-patient and highly recommended insulin 70/30 regimen to patient.     LOS: 1 day   Genella Mech 07/20/2011, 11:50  AM

## 2011-07-20 NOTE — H&P (Signed)
Hospital Admission Note Date: 07/20/2011  Patient name: [name] Medical record number: [id number] Date of birth: 31-Jan-1956 Age: 56 y.o. Gender: female PCP: [name], DO  Medical Service: Internal Medicine Teaching Service, [facility] Service  Attending physician:  Dr. Daiva Eves    1st Contact: Dr. Dorise Hiss   Pager: [phone] 2nd Contact: Dr. Dorthula Rue   Pager: [phone] After 5 pm or weekends: 1st Contact:      Pager: [phone] 2nd Contact:      Pager: [phone]  Chief Complaint: near syncope, BRBPR  History of Present Illness: [name] is a 56 year old woman with a PMH significant for poorly controlled diabetes mellitus type II, Hypertension, CKD stage III, and a question of IBS who presented to the [facility] ED after a near syncopal episode.  The patient was doing laundry at the [facility] when she started to feel nauseated.  She attempted to have a BM and states that she had to strain and only was able to pass minimal stool.  When she went back out to check her laundry she started to feel like she was going to pass out.  She sat down but states that she didn't lose consciousness.  She has never had anything like this happen before.  She denies palpitations, vertigo, LOC, diarrhea, or recent blood in her stool.  She states that she had been having normal BMs for the last few days after starting to take some Ranitidine that her daughter was prescribed but didn't require.  She had been taking 150 mg BID which she states has helped her stomach pain and nausea.    On arrival to the [facility] ED she was noted to be in sinus tachycardia with elevated CBG to 323.  Over the course of her ED stay she became progressively more nauseated and had three BM which were noted to have blood in the bowl.  EDP performed a rectal exam which he states had flakes of blood noted and FOBT was positive.  The patient also began complaining of bilateral lower abdominal quadrant pain.  She was given a GI cocktail which she  states completely resolved the abdominal pain.  She denies fever, chills, blood in the vomitus, or previous blood in her stool.    Meds: Current Outpatient Rx  Name Route Sig Dispense Refill  . ACETAMINOPHEN 325 MG PO TABS Oral Take 650 mg by mouth 2 (two) times daily as needed. For pain    . ASPIRIN 81 MG PO TABS Oral Take 81 mg by mouth every morning.    . ENALAPRIL-HYDROCHLOROTHIAZIDE 5-12.5 MG PO TABS Oral Take 1 tablet by mouth every morning.     Marland Kitchen GLIPIZIDE 10 MG PO TABS Oral Take 1 tablet (10 mg total) by mouth 2 (two) times daily before a meal. 60 tablet 3    CYCLE FILL MEDICATION. Authorization is required f ...  . RANITIDINE HCL 150 MG PO TABS Oral Take 150 mg by mouth 2 (two) times daily.    Marland Kitchen SIMVASTATIN 40 MG PO TABS Oral Take 40 mg by mouth every evening.     Marland Kitchen GLUCOSE BLOOD VI STRP  Check blood sugars 2-3 times a week before breakfast. 100 each 11   Allergies: Allergies as of 07/19/2011  . (No Known Allergies)   Past Medical History  Diagnosis Date  . Diabetes mellitus type II, uncontrolled   . Hypertension   . Hyperlipidemia   . Ovarian cyst, left   . Anemia     due to menorrhagia,  BL 8-10  . Vaginal cyst     nabothian and bartholin  . CKD (chronic kidney disease) stage 3, GFR 30-59 ml/min     baseline creatinine 1.4-1.7  . Anxiety   . Depression   . Postmenopausal bleeding 06/12/2008  . Congenital heart defect     surgically corrected as a child   Past Surgical History  Procedure Date  . Cardiac surgery     to repair congenital defect as a child   Family History  Problem Relation Age of Onset  . Stroke Father   . Heart attack Father     Had MI in his 73s  . Stomach cancer Paternal Grandmother   . Diabetes Maternal Grandmother   . Cerebral palsy Daughter   . Anesthesia problems Neg Hx   . Hypotension Neg Hx   . Malignant hyperthermia Neg Hx   . Pseudochol deficiency Neg Hx    History   Social History  . Marital Status: Divorced    Spouse Name:  N/A    Number of Children: 1  . Years of Education: 12th grade   Occupational History  . 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 Smoking status: Never Smoker   . Smokeless tobacco: Former Neurosurgeon  . Alcohol Use: No  . Drug Use: No  . Sexually Active: No   Other Topics Concern  . 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   Review of Systems: Constitutional: Denies fever, chills, diaphoresis, appetite change and fatigue.  HEENT: Denies photophobia, eye pain, redness, hearing loss, ear pain, congestion, sore throat, rhinorrhea, sneezing, mouth sores, trouble swallowing, neck pain, neck stiffness and tinnitus.   Respiratory: Denies SOB, DOE, cough, chest tightness,  and wheezing.   Cardiovascular: Denies chest pain, palpitations and leg swelling.  Gastrointestinal: Positive for nausea, abdominal pain, and blood in stool.  Denies vomiting, diarrhea, constipation, and abdominal distention.  Genitourinary: Denies dysuria, urgency, frequency, hematuria, flank pain and difficulty urinating.  Musculoskeletal: Denies myalgias, back pain, joint swelling, arthralgias and gait problem.  Skin: Denies pallor, rash and wound.  Neurological: Denies dizziness, seizures, syncope, weakness, light-headedness, numbness and headaches.  Hematological: Denies adenopathy. Easy bruising, personal or family bleeding history  Psychiatric/Behavioral: Denies suicidal ideation, mood changes, confusion, nervousness, sleep disturbance and agitation  Physical Exam: Blood pressure 131/76, pulse 113, temperature 98.1 F (36.7 C), temperature source Oral, resp. rate 19, SpO2 97.00%. Constitutional: Vital signs reviewed.  Patient is a well-developed and well-nourished woman in no acute distress and cooperative with exam. Alert and oriented x3.  She is mildly anxious throughout exam Head: Normocephalic and atraumatic Ear: TM normal  bilaterally Mouth: no erythema or exudates, dry mucus membranes noted Eyes: PERRL, EOMI, conjunctivae normal, No scleral icterus.  Neck: Supple, Trachea midline normal ROM, No JVD, mass, thyromegaly, or carotid bruit present.  Cardiovascular: tachycardia, regular rhythm,  S1 normal, S2 normal, no MRG, pulses symmetric and intact bilaterally Pulmonary/Chest: CTAB, no wheezes, rales, or rhonchi Abdominal: Soft. Non-tender, non-distended, hyperactive bowel sounds, no masses, organomegaly, or guarding present.  GU: no CVA tenderness, deflated external hemorrhoids noted on rectal inspection.  Musculoskeletal: No joint deformities, erythema, or stiffness, ROM full and no nontender Hematology: no cervical, inginal, or axillary adenopathy.  Neurological: A&O x3, Strength is normal and symmetric bilaterally, cranial nerve II-XII are grossly intact, no focal motor deficit, sensory intact to light touch bilaterally.  Skin: Warm, dry and  intact. No rash, cyanosis, or clubbing.  Psychiatric: anxious mood and affect. speech mildly rushed and behavior is normal. Judgment and thought content normal. Cognition and memory are normal.    Lab results: Basic Metabolic Panel:  Basename 07/19/11 1914 07/19/11 1830  NA 134* 136  K 5.0 4.1  CL 95* 97  CO2 26 --  GLUCOSE 323* 326*  BUN 35* 36*  CREATININE 1.52* 1.60*  CALCIUM 9.4 --  MG -- --  PHOS -- --   CBC:  Basename 07/19/11 1830  WBC --  NEUTROABS --  HGB 14.6  HCT 43.0  MCV --  PLT --   CBG:  Basename 07/19/11 1855  GLUCAP 307*   Urinalysis:  Basename 07/19/11 2142  COLORURINE YELLOW  LABSPEC 1.015  PHURINE 5.0  GLUCOSEU 250*  HGBUR LARGE*  BILIRUBINUR NEGATIVE  KETONESUR 15*  PROTEINUR NEGATIVE  UROBILINOGEN 0.2  NITRITE NEGATIVE  LEUKOCYTESUR MODERATE*   Imaging results:  Ct Abdomen Pelvis Wo Contrast  07/20/2011  *RADIOLOGY REPORT*  Clinical Data: Abdominal pain  CT ABDOMEN AND PELVIS WITHOUT CONTRAST  Technique:   Multidetector CT imaging of the abdomen and pelvis was performed following the standard protocol without intravenous contrast.  Comparison: 12/29/2008  Findings: Bibasilar opacities.  Heart size upper normal to mildly enlarged.  No pleural or pericardial effusion.  Organ abnormality/lesion detection is limited in the absence of intravenous contrast. Within this limitation, unremarkable liver, spleen, adrenal glands.  Cannot exclude gallstones or gallbladder wall thickening.  No pericholecystic fluid.  Atrophy versus agenesis of the body/tail of the pancreas.  Unchanged appearance to the head.  Unremarkable adrenal glands.  There are bilateral renal cysts of varying complexity.  Layering calcium versus wall calcium involving interpolar cyst on the right. No hydronephrosis or hydroureter.  Decompressed colon limits evaluation.  There is mild pericolonic fat stranding.  Fatty infiltration of the submucosa suggest sequelae of prior colitis.  Normal appendix.  No free intraperitoneal air or fluid.  No lymphadenopathy.  Normal caliber vasculature.  Thin-walled bladder.  Suggestion of bicornuate uterus.  Limited evaluation of the adnexa by noncontrast CT.  Multilevel degenerative changes of the imaged spine. No acute or aggressive appearing osseous lesion.  IMPRESSION: Limited examination in the absence of intravenous contrast.  The colon is decompressed however allowing for this, there is suggestion of mild wall thickening / pericolonic fat stranding as can be seen with a nonspecific colitis (infectious, inflammatory, and ischemic considerations).  Bilateral renal lesions of varying complexity.  Note that the previous MRI in 2010 recommend an MR follow-up.  This is again recommended.  Original Report Authenticated By: Waneta Martins, M.D.   Other results: ED ECG REPORT   Date: 07/20/2011  EKG Time: 2:53 AM  Rate: 104  Rhythm: sinus tachycardia,  normal EKG, normal sinus rhythm, unchanged from previous tracings,  atypical RBBB  Axis: normal  Intervals:right bundle branch block and atypical, borderline 1st degree block  ST&T Change: None   Assessment & Plan by Problem: 1. Bright Red Blood per rectum:  [name] had three BM with nursing reported bright red blood both on the outside of the stool as well as in the bowl of the toilet.  She has external hemorrhoids on exam but none appear to be thrombosed.  She also states that the BMs were painless which makes fissure or bleeding hemorrhoids less likely.  She has been using ranitidine for relief of dyspepsia recently and her abdominal pain, though it was lower abdominal pain, resolved in  the ED with the administration of a GI cocktail.  She was not symptomatic with position changes but had already received 2L of NS bolus during her ED evaluation.  Differential diagnosis includes PUD, gastritis, or infectious colitis.  CT scan did show some mild signs consistent with colitis or previous colitis that is resolving.  She has not vomited blood and does not drink alcohol or smoke.   Plan:  - Admit to telemetry  - Fluid replete with NS at 250 ml/hr   - repeat CBC Q4 x3  - NPO after midnight  - Consider GI consult for further bleeding  - Protonix 40 mg daily  2. Sinus tachycardia: We have no previous EKG in the system but she appears to be in sinus tachycardia on the monitor as well as the EKG collected in the ED.  Differential diagnosis includes secondary to GI bleed, or possible anxiety.  She has some mild pressured speech and states that she has had plenty of stressors recently.  She also had the three BM with blood in the stool so intravascular depletion due to the GI bleed is also in the differential.    Plan:  - Repeat EKG in the AM  - follow on Tele  - Fluid repletion per #1.   3. Diabetes mellitus Type II, uncontrolled:  CBG on arrival to the ED was 323 and her last A1C back in March of 2013 was 11.7.  She is maintained on glipizide as an outpatient and  has been hesitant to start insulin in the past.  She would likely benefit from diabetes education during her stay.  Plan:   - hold glipizide  - CBG Q4 while NPO then qACHS  - SSI-S  - A1C  - Diabetes educator consult  4.  Hypertension:  With her current GI bleed we will hold her BP medications and follow.  If she does continue to rise she may benefit from a beta blocker or adding back her enalapril.   5. VTE: SCDs  Signed: Hebah Burns 07/20/2011, 2:48 AM

## 2011-07-20 NOTE — Discharge Summary (Signed)
Internal Medicine Teaching [facility] Discharge Note  Name: [name] MRN: [id number] DOB: [birth year] 56 y.o.  Date of Admission: 07/19/2011  5:04 PM Date of Discharge: 07/20/2011 Attending Physician: Randall Hiss, MD  Discharge Diagnosis: Principal Problem:  *Bright red blood per rectum Active Problems:  Diabetes mellitus type II, uncontrolled  HYPERTENSION  CKD (chronic kidney disease) stage 3, GFR 30-59 ml/min  Sinus tachycardia   Discharge Medications: Medication List  As of 07/20/2011  2:25 PM   TAKE these medications         acetaminophen 325 MG tablet   Commonly known as: TYLENOL   Take 650 mg by mouth 2 (two) times daily as needed. For pain      aspirin 81 MG tablet   Take 81 mg by mouth every morning.      Enalapril-Hydrochlorothiazide 5-12.5 MG per tablet   Take 1 tablet by mouth every morning.      glipiZIDE 10 MG tablet   Commonly known as: GLUCOTROL   Take 1 tablet (10 mg total) by mouth 2 (two) times daily before a meal.      glucose blood test strip   Check blood sugars 2-3 times a week before breakfast.      ranitidine 150 MG tablet   Commonly known as: ZANTAC   Take 150 mg by mouth 2 (two) times daily.      simvastatin 40 MG tablet   Commonly known as: ZOCOR   Take 40 mg by mouth every evening.            Disposition and follow-up:   [name] was discharged from [facility] in Stable condition.  At the hospital follow up visit please check CBC for blood count stability and refer to GI for colonoscopy/EGD. Also have discussion about starting insulin 70/30. Talked with patient in hospital about this extensively and she wanted time to think about it.   Follow-up Appointments: Follow-up Information    Follow up with Lars Mage, MD on 07/26/2011. (2:45 PM)    Contact information:   [address] [place] [address] [phone]         Discharge Orders    Future Appointments:  Provider: Department: Dept Phone: Center:   07/26/2011 2:45 PM Lars Mage, MD [facility] [phone] [facility]     Future Orders Please Complete By Expires   Diet - low sodium heart healthy      Increase activity slowly      Call MD for:  persistant nausea and vomiting      Call MD for:      Scheduling Instructions:   Loss of consciousness, bleeding. Call doctor immediately or seek medical attention.      Consultations:  NONE  Procedures Performed:  Ct Abdomen Pelvis Wo Contrast  07/20/2011  *RADIOLOGY REPORT*  Clinical Data: Abdominal pain  CT ABDOMEN AND PELVIS WITHOUT CONTRAST  Technique:  Multidetector CT imaging of the abdomen and pelvis was performed following the standard protocol without intravenous contrast.  Comparison: 12/29/2008  Findings: Bibasilar opacities.  Heart size upper normal to mildly enlarged.  No pleural or pericardial effusion.  Organ abnormality/lesion detection is limited in the absence of intravenous contrast. Within this limitation, unremarkable liver, spleen, adrenal glands.  Cannot exclude gallstones or gallbladder wall thickening.  No pericholecystic fluid.  Atrophy versus agenesis of the body/tail of the pancreas.  Unchanged appearance to the head.  Unremarkable adrenal glands.  There are bilateral renal cysts  of varying complexity.  Layering calcium versus wall calcium involving interpolar cyst on the right. No hydronephrosis or hydroureter.  Decompressed colon limits evaluation.  There is mild pericolonic fat stranding.  Fatty infiltration of the submucosa suggest sequelae of prior colitis.  Normal appendix.  No free intraperitoneal air or fluid.  No lymphadenopathy.  Normal caliber vasculature.  Thin-walled bladder.  Suggestion of bicornuate uterus.  Limited evaluation of the adnexa by noncontrast CT.  Multilevel degenerative changes of the imaged spine. No acute or aggressive appearing osseous lesion.  IMPRESSION: Limited examination in the absence of  intravenous contrast.  The colon is decompressed however allowing for this, there is suggestion of mild wall thickening / pericolonic fat stranding as can be seen with a nonspecific colitis (infectious, inflammatory, and ischemic considerations).  Bilateral renal lesions of varying complexity.  Note that the previous MRI in 2010 recommend an MR follow-up.  This is again recommended.  Original Report Authenticated By: Waneta Martins, M.D.   Admission HPI:  [name] is a 56 year old woman with a PMH significant for poorly controlled diabetes mellitus type II, Hypertension, CKD stage III, and a question of IBS who presented to the [facility] ED after a near syncopal episode. The patient was doing laundry at the [facility] when she started to feel nauseated. She attempted to have a BM and states that she had to strain and only was able to pass minimal stool. When she went back out to check her laundry she started to feel like she was going to pass out. She sat down but states that she didn't lose consciousness. She has never had anything like this happen before. She denies palpitations, vertigo, LOC, diarrhea, or recent blood in her stool. She states that she had been having normal BMs for the last few days after starting to take some Ranitidine that her daughter was prescribed but didn't require. She had been taking 150 mg BID which she states has helped her stomach pain and nausea.  On arrival to the [facility] ED she was noted to be in sinus tachycardia with elevated CBG to 323. Over the course of her ED stay she became progressively more nauseated and had three BM which were noted to have blood in the bowl. EDP performed a rectal exam which he states had flakes of blood noted and FOBT was positive. The patient also began complaining of bilateral lower abdominal quadrant pain. She was given a GI cocktail which she states completely resolved the abdominal pain. She denies fever, chills, blood in the vomitus, or previous  blood in her stool.   Hospital Course by problem list:   Bright red blood per rectum - The patient did have some streaking of blood in stools in the ED with no repeat bloody stools. Her Hg levels were stable for about 24 hours and were not decreasing. Will need GI workup as out-patient. She did not wish to stay for work up and does have a lot of personal responsibilities at home including daughter with chronic health disability and mother. The red streaking in stool is likely to represent lower GI cause including hemorrhoids, polyps, AVMs, etc.   Pre-syncope - Likely related to anxiety and caretaker stress. She did get some sleep here in the hospital. Also likely to be due to fluid loss from diabetes which is fairly uncontrolled. Stable without repeat incident in hospital.    Sinus tachycardia - Likely multi-factorial and may have been related to stress, fluid loss, anxiety, chronic  state of dehydration from diabetes mellitus type II.    Diabetes mellitus type II, uncontrolled - The patient is taking glipizide at home and is in process of getting orange card to get [place] but did talk to her about consequences of diabetes and not taking care of it. Did strongly recommend that she needs insulin now. She did not want to start at this time. Please give her sample vial of 70/30 insulin from the clinic so that she can start if she is agreeable to this at follow up.    HYPERTENSION - Blood pressure controlled during this stay with some slight elevation for anxiety. Please follow up on that in out-patient visit.    CKD (chronic kidney disease) stage 3, GFR 30-59 ml/min - Did talk to the patient about the fact that her kidneys were starting to be affected by the diabetes and re-iterate that she needed to care for herself. Creatinine is at her baseline at this time.   Discharge Vitals:  BP 148/67  Pulse 98  Temp 98.2 F (36.8 C) (Oral)  Resp 18  Wt 165 lb 2 oz (74.9 kg)  SpO2 98%  Discharge Labs:    Results for orders placed during the hospital encounter of 07/19/11 (from the past 24 hour(s))  POCT I-STAT, CHEM 8     Status: Abnormal   Collection Time   07/19/11  6:30 PM      Component Value Range   Sodium 136  135 - 145 mEq/L   Potassium 4.1  3.5 - 5.1 mEq/L   Chloride 97  96 - 112 mEq/L   BUN 36 (*) 6 - 23 mg/dL   Creatinine, Ser 1.61 (*) 0.50 - 1.10 mg/dL   Glucose, Bld 096 (*) 70 - 99 mg/dL   Calcium, Ion 0.45  4.09 - 1.32 mmol/L   TCO2 27  0 - 100 mmol/L   Hemoglobin 14.6  12.0 - 15.0 g/dL   HCT 81.1  91.4 - 78.2 %  POCT I-STAT TROPONIN I     Status: Normal   Collection Time   07/19/11  6:31 PM      Component Value Range   Troponin i, poc 0.00  0.00 - 0.08 ng/mL   Comment 3           GLUCOSE, CAPILLARY     Status: Abnormal   Collection Time   07/19/11  6:55 PM      Component Value Range   Glucose-Capillary 307 (*) 70 - 99 mg/dL  BASIC METABOLIC PANEL     Status: Abnormal   Collection Time   07/19/11  7:14 PM      Component Value Range   Sodium 134 (*) 135 - 145 mEq/L   Potassium 5.0  3.5 - 5.1 mEq/L   Chloride 95 (*) 96 - 112 mEq/L   CO2 26  19 - 32 mEq/L   Glucose, Bld 323 (*) 70 - 99 mg/dL   BUN 35 (*) 6 - 23 mg/dL   Creatinine, Ser 9.56 (*) 0.50 - 1.10 mg/dL   Calcium 9.4  8.4 - 21.3 mg/dL   GFR calc non Af Amer 38 (*) >90 mL/min   GFR calc Af Amer 44 (*) >90 mL/min  POCT I-STAT 3, BLOOD GAS (G3P V)     Status: Abnormal   Collection Time   07/19/11  7:57 PM      Component Value Range   pH, Ven 7.317 (*) 7.250 - 7.300   pCO2, Ven 53.2 (*) 45.0 -  50.0 mmHg   pO2, Ven 21.0 (*) 30.0 - 45.0 mmHg   Bicarbonate 27.2 (*) 20.0 - 24.0 mEq/L   TCO2 29  0 - 100 mmol/L   O2 Saturation 29.0     Sample type VENOUS     Comment NOTIFIED PHYSICIAN    URINALYSIS, ROUTINE W REFLEX MICROSCOPIC     Status: Abnormal   Collection Time   07/19/11  9:42 PM      Component Value Range   Color, Urine YELLOW  YELLOW   APPearance CLOUDY (*) CLEAR   Specific Gravity, Urine 1.015   1.005 - 1.030   pH 5.0  5.0 - 8.0   Glucose, UA 250 (*) NEGATIVE mg/dL   Hgb urine dipstick LARGE (*) NEGATIVE   Bilirubin Urine NEGATIVE  NEGATIVE   Ketones, ur 15 (*) NEGATIVE mg/dL   Protein, ur NEGATIVE  NEGATIVE mg/dL   Urobilinogen, UA 0.2  0.0 - 1.0 mg/dL   Nitrite NEGATIVE  NEGATIVE   Leukocytes, UA MODERATE (*) NEGATIVE  URINE MICROSCOPIC-ADD ON     Status: Abnormal   Collection Time   07/19/11  9:42 PM      Component Value Range   Squamous Epithelial / LPF MANY (*) RARE   WBC, UA 11-20  <3 WBC/hpf   RBC / HPF 7-10  <3 RBC/hpf   Bacteria, UA RARE  RARE  OCCULT BLOOD, POC DEVICE     Status: Normal   Collection Time   07/20/11  1:39 AM      Component Value Range   Fecal Occult Bld POSITIVE    GLUCOSE, CAPILLARY     Status: Abnormal   Collection Time   07/20/11  3:29 AM      Component Value Range   Glucose-Capillary 420 (*) 70 - 99 mg/dL  CBC     Status: Abnormal   Collection Time   07/20/11  3:37 AM      Component Value Range   WBC 15.9 (*) 4.0 - 10.5 K/uL   RBC 3.93  3.87 - 5.11 MIL/uL   Hemoglobin 11.8 (*) 12.0 - 15.0 g/dL   HCT 78.4 (*) 69.6 - 29.5 %   MCV 85.5  78.0 - 100.0 fL   MCH 30.0  26.0 - 34.0 pg   MCHC 35.1  30.0 - 36.0 g/dL   RDW 28.4  13.2 - 44.0 %   Platelets 157  150 - 400 K/uL  COMPREHENSIVE METABOLIC PANEL     Status: Abnormal   Collection Time   07/20/11  3:37 AM      Component Value Range   Sodium 132 (*) 135 - 145 mEq/L   Potassium 4.8  3.5 - 5.1 mEq/L   Chloride 96  96 - 112 mEq/L   CO2 24  19 - 32 mEq/L   Glucose, Bld 443 (*) 70 - 99 mg/dL   BUN 36 (*) 6 - 23 mg/dL   Creatinine, Ser 1.02 (*) 0.50 - 1.10 mg/dL   Calcium 8.6  8.4 - 72.5 mg/dL   Total Protein 6.4  6.0 - 8.3 g/dL   Albumin 3.6  3.5 - 5.2 g/dL   AST 23  0 - 37 U/L   ALT 29  0 - 35 U/L   Alkaline Phosphatase 103  39 - 117 U/L   Total Bilirubin 0.6  0.3 - 1.2 mg/dL   GFR calc non Af Amer 33 (*) >90 mL/min   GFR calc Af Amer 38 (*) >90 mL/min  GLUCOSE, CAPILLARY  Status: Abnormal   Collection Time   07/20/11  5:45 AM      Component Value Range   Glucose-Capillary 354 (*) 70 - 99 mg/dL  GLUCOSE, CAPILLARY     Status: Abnormal   Collection Time   07/20/11  7:41 AM      Component Value Range   Glucose-Capillary 280 (*) 70 - 99 mg/dL   Comment 1 Notify RN    CBC     Status: Abnormal   Collection Time   07/20/11  8:15 AM      Component Value Range   WBC 16.7 (*) 4.0 - 10.5 K/uL   RBC 3.94  3.87 - 5.11 MIL/uL   Hemoglobin 12.0  12.0 - 15.0 g/dL   HCT 16.1 (*) 09.6 - 04.5 %   MCV 85.8  78.0 - 100.0 fL   MCH 30.5  26.0 - 34.0 pg   MCHC 35.5  30.0 - 36.0 g/dL   RDW 40.9  81.1 - 91.4 %   Platelets 197  150 - 400 K/uL  CBC     Status: Abnormal   Collection Time   07/20/11 11:40 AM      Component Value Range   WBC 13.9 (*) 4.0 - 10.5 K/uL   RBC 3.75 (*) 3.87 - 5.11 MIL/uL   Hemoglobin 11.5 (*) 12.0 - 15.0 g/dL   HCT 78.2 (*) 95.6 - 21.3 %   MCV 86.7  78.0 - 100.0 fL   MCH 30.7  26.0 - 34.0 pg   MCHC 35.4  30.0 - 36.0 g/dL   RDW 08.6  57.8 - 46.9 %   Platelets 179  150 - 400 K/uL  GLUCOSE, CAPILLARY     Status: Abnormal   Collection Time   07/20/11 11:42 AM      Component Value Range   Glucose-Capillary 151 (*) 70 - 99 mg/dL   Comment 1 Notify RN      Signed: Genella Mech 07/20/2011, 2:25 PM   Time Spent on Discharge: 25 minutes

## 2011-07-20 NOTE — ED Notes (Signed)
Admitting at bedside 

## 2011-07-20 NOTE — Progress Notes (Signed)
Pt discharged home with discharge summary review. Pt voiced understanding, denies any questions. Advised to return to emergency room if she experiences any sudden changes. Pt stable upon discharge.

## 2011-07-20 NOTE — H&P (Signed)
Internal Medicine Teaching Service Attending Note Date: 07/20/2011  Patient name: [name]  Medical record number: [id number]  Date of birth: 1956-01-10    This patient has been seen and discussed with the house staff. Please see their note for complete details. I concur with their findings with the following additions/corrections:  56 year old with poorly controlled diabetes, or tension chronic kidney disease possible interval bowel syndrome is admitted to our service with severe abdominal pain nausea and near syncopal event. She's also had some blood per him it seems to be associated with hemorrhoids.  Her response to GI cocktail seems that her abdominal pain and nausea more reluctant likely related to gastritis or some other upper gastrointestinal issue. Given the lack of diarrhea I find the CT scan findings to be of questionable significance. I am skeptical if she has any significant lower GI bleed. Agree with continuing her on proton asked and will consider GI consultation. She clearly needs better control of her diabetes mellitus.  Of note she seems to be suffering from some caretaker fatigue and taking care of her 36-year-old daughter who suffers from Guillain-Barr syndrome.  Acey Lav 07/20/2011, 11:41 AM

## 2011-07-23 NOTE — ED Provider Notes (Signed)
I saw and evaluated the patient, reviewed the resident's note and I agree with the findings and plan.   .Face to face Exam:  General:  Awake HEENT:  Atraumatic Resp:  Normal effort Abd:  Nondistended Neuro:No focal weakness Lymph: No adenopathy   Nelia Shi, MD 07/23/11 1536

## 2011-07-26 ENCOUNTER — Encounter: Payer: Self-pay | Admitting: Internal Medicine

## 2011-07-28 ENCOUNTER — Ambulatory Visit (INDEPENDENT_AMBULATORY_CARE_PROVIDER_SITE_OTHER): Payer: Self-pay | Admitting: Internal Medicine

## 2011-07-28 ENCOUNTER — Encounter: Payer: Self-pay | Admitting: Internal Medicine

## 2011-07-28 VITALS — BP 111/70 | HR 86 | Temp 98.1°F | Ht 64.0 in | Wt 163.2 lb

## 2011-07-28 DIAGNOSIS — D649 Anemia, unspecified: Secondary | ICD-10-CM

## 2011-07-28 DIAGNOSIS — E1165 Type 2 diabetes mellitus with hyperglycemia: Secondary | ICD-10-CM

## 2011-07-28 DIAGNOSIS — I10 Essential (primary) hypertension: Secondary | ICD-10-CM

## 2011-07-28 DIAGNOSIS — K625 Hemorrhage of anus and rectum: Secondary | ICD-10-CM

## 2011-07-28 LAB — CBC
MCH: 30.1 pg (ref 26.0–34.0)
MCHC: 34.2 g/dL (ref 30.0–36.0)
Platelets: 293 10*3/uL (ref 150–400)
RDW: 12.6 % (ref 11.5–15.5)
WBC: 9.1 10*3/uL (ref 4.0–10.5)

## 2011-07-28 LAB — GLUCOSE, CAPILLARY: Glucose-Capillary: 392 mg/dL — ABNORMAL HIGH (ref 70–99)

## 2011-07-28 NOTE — Patient Instructions (Signed)

## 2011-07-28 NOTE — Assessment & Plan Note (Addendum)
We spent more than 10 minutes discussing the need of insulin at this time. Patient cannot afford $25 for the bottle of insulin 70/30. She states that she wants to control her blood sugar with diet and exercise although she is very stressed out and trying to do her best. Followup appointment with PCP. Patient was given information about application for assistance by our social worker today.

## 2011-07-28 NOTE — Assessment & Plan Note (Addendum)
GI referral today CBC to recheck today No further episodes of bleeding noted. Followup as needed.

## 2011-07-28 NOTE — Assessment & Plan Note (Signed)
A CBC was checked today to check her hemoglobin levels.  She denies further bleeding from her rectum.

## 2011-07-28 NOTE — Progress Notes (Signed)
  Subjective:    Patient ID: [name], female    DOB: 20-Nov-1955, 56 y.o.   MRN: [id number]  HPI  Patient is a 56 year old female with past medical history most significant for uncontrolled diabetes type 2, chronic kidney disease stage III and recent admission for rectal bleeding.  This is a followup appointment from the hospital:  #1 Patient denies any episodes of bleeding from her rectum since discharge. I would check CBC today as requested by the discharging physician. Patient was also given a GI referral today although I doubt that she would be able to go because of being uninsured and are quota of uninsured referrals being fulfilled for this month.  #2 Patient has uncontrolled diabetes with HbA1c more than 11. Patient is currently taking only glipizide. We discussed at great detail about starting insulin at this time. Patient refused as she does not have monetary support to start insulin.   No other complaints at this time.  Review of Systems  Constitutional: Negative for fever, activity change and appetite change.  HENT: Negative for sore throat.   Respiratory: Negative for cough and shortness of breath.   Cardiovascular: Negative for chest pain and leg swelling.  Gastrointestinal: Negative for nausea, abdominal pain, diarrhea, constipation and abdominal distention.  Genitourinary: Negative for frequency, hematuria and difficulty urinating.  Neurological: Negative for dizziness and headaches.  Psychiatric/Behavioral: Negative for suicidal ideas and behavioral problems. The patient is nervous/anxious.        Objective:   Physical Exam  Constitutional: She is oriented to person, place, and time. She appears well-developed and well-nourished.  HENT:  Head: Normocephalic and atraumatic.  Eyes: Conjunctivae and EOM are normal. Pupils are equal, round, and reactive to light. No scleral icterus.  Neck: Normal range of motion. Neck supple. No JVD present. No thyromegaly present.    Cardiovascular: Normal rate, regular rhythm, normal heart sounds and intact distal pulses.  Exam reveals no gallop and no friction rub.   No murmur heard. Pulmonary/Chest: Effort normal and breath sounds normal. No respiratory distress. She has no wheezes. She has no rales.  Abdominal: Soft. Bowel sounds are normal. She exhibits no distension and no mass. There is no tenderness. There is no rebound and no guarding.  Musculoskeletal: Normal range of motion. She exhibits no edema and no tenderness.  Lymphadenopathy:    She has no cervical adenopathy.  Neurological: She is alert and oriented to person, place, and time.  Psychiatric: She has a normal mood and affect.       Anxious appearing          Assessment & Plan:

## 2011-07-28 NOTE — Assessment & Plan Note (Signed)
Well-controlled  at this time 

## 2011-08-01 ENCOUNTER — Telehealth: Payer: Self-pay | Admitting: Licensed Clinical Social Worker

## 2011-08-01 NOTE — Telephone Encounter (Signed)
[name] was referred to CSW due to her lack of insurance and current financial strain.  Pt had already left [facility] before CSW was able to follow up.  Was initially though 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  CSW placed call to pt.  Upon speaking with [name], pt is the caregiver of her 54 year old daughter.  Thus, would not be appropriate for Medicaid.  Pt states she is not disabled but spend her time caring for her daughter who has CP.  Pt aware of the resources she can obtain for her daughter, but states she prefers to do the care herself. CSW spent majority of telephone conversation allowing [name] to voice her frustration with the health care system and transportation system for the disabled.  [name] is aware of the orange card, CSW encouraged pt to apply as pt would be eligible to see diabetes educator without receiving bill, obtain medical prescriptions for $6 cost and obtain referrals to specialist.  Pt states financial counselor was not in the day she was in [facility].  [name] states she receives her prescriptions for free or for $3.99 at [employer].  [name] aware of the assistance programs that are available to assist with her utilities but does not choose to use them at this time.  Pt aware of the need to exercise and deal with her stress to improve her medical condition.  [name] states she is "set in my ways", but is aware CSW is available to assist when pt is ready to receive assistance.  [name] would benefit from applying for the [facility] orange card.  CSW provided pt with [facility] office number to schedule her follow-up PCP appt and number to Diabetes educator.  Pt aware CSW is available and has CSW contact information.

## 2011-10-12 ENCOUNTER — Other Ambulatory Visit: Payer: Self-pay | Admitting: Internal Medicine

## 2011-10-13 ENCOUNTER — Encounter: Payer: Self-pay | Admitting: *Deleted

## 2011-11-09 ENCOUNTER — Other Ambulatory Visit: Payer: Self-pay | Admitting: Internal Medicine

## 2011-11-09 DIAGNOSIS — E1165 Type 2 diabetes mellitus with hyperglycemia: Secondary | ICD-10-CM

## 2011-11-09 DIAGNOSIS — I10 Essential (primary) hypertension: Secondary | ICD-10-CM

## 2011-11-09 NOTE — Telephone Encounter (Signed)
Please inform patient she needs to be seen for additional refills of her medications. I have sent in a 1 month supply already.  Thank you! - [name], D.O., 11/09/2011, 2:28 PM

## 2011-11-14 NOTE — Addendum Note (Signed)
Addended by: Neomia Dear on: 11/14/2011 05:04 PM   Modules accepted: Orders

## 2011-12-09 ENCOUNTER — Other Ambulatory Visit: Payer: Self-pay | Admitting: Internal Medicine

## 2011-12-09 DIAGNOSIS — E785 Hyperlipidemia, unspecified: Secondary | ICD-10-CM

## 2011-12-09 DIAGNOSIS — IMO0002 Reserved for concepts with insufficient information to code with codable children: Secondary | ICD-10-CM

## 2011-12-09 DIAGNOSIS — E1165 Type 2 diabetes mellitus with hyperglycemia: Secondary | ICD-10-CM

## 2011-12-09 DIAGNOSIS — I10 Essential (primary) hypertension: Secondary | ICD-10-CM

## 2011-12-09 NOTE — Telephone Encounter (Signed)
Please inform patient she needs to be seen for additional refills to be sent. I have sent her a 2 month supply, and will fill again.  Thank you! - [name], D.O., 12/09/2011, 12:26 PM

## 2011-12-09 NOTE — Telephone Encounter (Signed)
Message sent to front desk pool for appt. 

## 2012-01-07 ENCOUNTER — Other Ambulatory Visit: Payer: Self-pay | Admitting: Internal Medicine

## 2012-01-09 NOTE — Telephone Encounter (Signed)
Please ask pt to schedule an appt for any additional refills.  Thank you! - [name], D.O., 01/09/2012, 5:50 PM

## 2012-01-30 ENCOUNTER — Encounter: Payer: Self-pay | Admitting: Internal Medicine

## 2012-02-14 ENCOUNTER — Encounter: Payer: Self-pay | Admitting: Internal Medicine

## 2012-02-29 ENCOUNTER — Encounter: Payer: Self-pay | Admitting: Internal Medicine

## 2012-03-02 ENCOUNTER — Other Ambulatory Visit: Payer: Self-pay | Admitting: Internal Medicine

## 2012-03-02 NOTE — Telephone Encounter (Signed)
I have sent a 30 day supply. She missed her appt today. I will not refill unless she comes for an appt.   Thank you! - [name], D.O., 03/02/2012, 5:58 PM

## 2012-03-05 NOTE — Telephone Encounter (Signed)
Pt has appointment scheduled for 12/9/.

## 2012-03-07 ENCOUNTER — Ambulatory Visit: Payer: Self-pay | Admitting: Internal Medicine

## 2012-04-01 ENCOUNTER — Other Ambulatory Visit: Payer: Self-pay | Admitting: Internal Medicine

## 2012-04-02 NOTE — Telephone Encounter (Signed)
Please schedule for appt. Uncontrolled DM, last seen 07/2011.   I am refilling 1 month supply, and she needs to come in within next 1 month for additional refills.  Thank you! - [name], D.O., 04/02/2012, 9:39 AM

## 2012-04-26 ENCOUNTER — Ambulatory Visit: Payer: Self-pay | Admitting: Internal Medicine

## 2012-04-28 ENCOUNTER — Other Ambulatory Visit: Payer: Self-pay | Admitting: Internal Medicine

## 2012-05-01 NOTE — Telephone Encounter (Signed)
No more refills until she comes for an appointment.

## 2012-05-01 NOTE — Telephone Encounter (Signed)
Please call and inform [name] that I will ONLY fill a 1 month supply of medications for her. She must come to the clinic within the next few weeks, otherwise, I will discontinue prescribing her medications. I cannot safely prescribe medications that I am unable to monitor because she does not come to clinic appointments. It is dangerous. Therefore, she must come in for additional refills.  Thank you! - [name], D.O., 05/01/2012, 7:44 AM

## 2012-05-01 NOTE — Telephone Encounter (Signed)
Appt already sch 05/07/12 with Dr Virgina Organ.

## 2012-05-07 ENCOUNTER — Ambulatory Visit: Payer: Self-pay | Admitting: Internal Medicine

## 2012-06-04 ENCOUNTER — Other Ambulatory Visit: Payer: Self-pay | Admitting: Internal Medicine

## 2012-06-05 NOTE — Telephone Encounter (Signed)
Please call the patient and informed her that I will only fill 1 month supply and no longer fill medications unless she comes in for an appointment. She was last seen in June 2013.  Thank you! - [name], D.O., 06/05/2012, 9:22 AM

## 2012-07-03 ENCOUNTER — Other Ambulatory Visit: Payer: Self-pay | Admitting: *Deleted

## 2012-07-03 MED ORDER — ENALAPRIL-HYDROCHLOROTHIAZIDE 5-12.5 MG PO TABS: 1.0000 | ORAL_TABLET | Freq: Every day | ORAL | Status: DC

## 2012-07-03 MED ORDER — SIMVASTATIN 40 MG PO TABS: 40.0000 mg | ORAL_TABLET | Freq: Every day | ORAL | Status: DC

## 2012-07-03 MED ORDER — GLIPIZIDE 10 MG PO TABS: 10.0000 mg | ORAL_TABLET | Freq: Two times a day (BID) | ORAL | Status: DC

## 2012-07-03 NOTE — Telephone Encounter (Signed)
She must come in. I am sending in 1 month supply only. This is not safe for her to keep postponing her appts, seen almost a year ago with multiple uncontrolled medical problems.  I will not fill any medications beyond this.  Thank you! - [name], D.O., 07/03/2012, 7:07 PM

## 2012-07-03 NOTE — Telephone Encounter (Signed)
i have spoken to pt, she was going to schedule an appt but dr Saralyn Pilar has nothing, she will call back 3rd week of June to schedule an appt and i will send this note to charsetta for appt scheduling with new pcp

## 2012-07-23 ENCOUNTER — Other Ambulatory Visit: Payer: Self-pay | Admitting: Internal Medicine

## 2012-07-23 MED ORDER — GLIPIZIDE 10 MG PO TABS: 10.0000 mg | ORAL_TABLET | Freq: Two times a day (BID) | ORAL | Status: DC

## 2012-07-23 NOTE — Telephone Encounter (Signed)
Done. Thanks.

## 2012-07-23 NOTE — Telephone Encounter (Signed)
Pt has scheduled appointment for Wed.  Will you refill for 3 days?

## 2012-07-23 NOTE — Telephone Encounter (Signed)
Cancelled last 6 appts and not seen on 1 yr. Needs appt before refill. Can be sch with [facility] if needed.

## 2012-07-23 NOTE — Addendum Note (Signed)
Addended by: Blanch Media A on: 07/23/2012 05:07 PM   Modules accepted: Orders

## 2012-07-23 NOTE — Telephone Encounter (Addendum)
Pt called to schedule appointment for OV per order Dr Meredith Pel. Will try again at 1:30

## 2012-07-25 ENCOUNTER — Ambulatory Visit (INDEPENDENT_AMBULATORY_CARE_PROVIDER_SITE_OTHER): Payer: Self-pay | Admitting: Internal Medicine

## 2012-07-25 VITALS — BP 137/92 | HR 100 | Temp 97.4°F | Ht 65.0 in | Wt 162.7 lb

## 2012-07-25 DIAGNOSIS — E785 Hyperlipidemia, unspecified: Secondary | ICD-10-CM

## 2012-07-25 DIAGNOSIS — I10 Essential (primary) hypertension: Secondary | ICD-10-CM

## 2012-07-25 DIAGNOSIS — I498 Other specified cardiac arrhythmias: Secondary | ICD-10-CM

## 2012-07-25 DIAGNOSIS — Z0000 Encounter for general adult medical examination without abnormal findings: Secondary | ICD-10-CM

## 2012-07-25 DIAGNOSIS — R000 Tachycardia, unspecified: Secondary | ICD-10-CM

## 2012-07-25 DIAGNOSIS — E1165 Type 2 diabetes mellitus with hyperglycemia: Secondary | ICD-10-CM

## 2012-07-25 DIAGNOSIS — F411 Generalized anxiety disorder: Secondary | ICD-10-CM

## 2012-07-25 LAB — POCT GLYCOSYLATED HEMOGLOBIN (HGB A1C): Hemoglobin A1C: 13.1

## 2012-07-25 LAB — GLUCOSE, CAPILLARY: Glucose-Capillary: 322 mg/dL — ABNORMAL HIGH (ref 70–99)

## 2012-07-25 MED ORDER — GLIPIZIDE 10 MG PO TABS: 20.0000 mg | ORAL_TABLET | Freq: Two times a day (BID) | ORAL | Status: DC

## 2012-07-25 NOTE — Assessment & Plan Note (Addendum)
Lab Results  Component Value Date   HGBA1C 13.1 07/25/2012   HGBA1C 11.4* 07/20/2011   HGBA1C 11.7 04/28/2011    Assessment: Diabetes control:  uncontroled Progress toward A1C goal:   Unsuccessful Comments: Patient is not willing to try insulin at this point and is poorly managed on diet/exercise and glipizide 10mg  BID. CBG today is 322.  Plan: Medications:  Increased glipizide to 20mg  BID with meals. Home glucose monitoring: None - patient is unable to afford the supplies Frequency:   Timing:   Instruction/counseling given: reminded to bring medications to each visit, discussed diet and other instruction/counseling: discussed the need for financial assistance, specifically the orange card, so that the patient can afford diabetic testing supplies. Educational resources provided: brochure Self management tools provided:   Other plans: Would like to try insulin, but patient refuses. Will check a BMP today to follow Cr and kidney function. Do not believe that the patient is in DKA as this current elevated CBG is consistent with her previous very elevated CBG.    ATTENDING A&P: Agree with MSIII Keith's A&P. Increase Glucotrol to max. Will likely need other agents (insulin) unless pt can make drastic lifestyle changes. However, currently options are only $4 list meds as pt has no insurance, no orange card, and unable to pay out of pocket. Cannot use metformin 2/2 renal dysfxn. No reason to check microalb as on ACEi. No payor source for DM eye exam. Foot exam  Today. No payor source for pneumovax. Pt agrees to F/U one month.

## 2012-07-25 NOTE — Assessment & Plan Note (Signed)
Will check BMP today. Needs better DM and HTN control. Agrees to one month F/U.

## 2012-07-25 NOTE — Patient Instructions (Addendum)
You were seen in clinic today to follow up your chronic medical conditions. It will be very important to follow up with Jay[facility] and work towards obtaining an orange card. This may help you immensely in the future with your medications and health exams. For your high blood pressure, please start taking your blood pressure at least once a week with the machine that you have at home. Also please check your blood pressure at any time that you feel light headed or "funny". For your diabetes, we are increasing your glipizide to 2 pills twice a day with meals. We would also like for you to start checking your sugars, but we understand that this is financially difficult for you right now. The orange card would be able to help you obtain some of these testing supplies more easily. For your high cholesterol, we checked your cholesterol today. We would also like for you to get a mammogram. You will be given a pamphlet about a way to obtain this within your financial means.  We also did blood work today to check your kidney function and your thyroid function. If there is anything concerning, we will contact you with that information.  Please follow up with [place] in this clinic in one month.  Diabetes Meal Planning Guide The diabetes meal planning guide is a tool to help you plan your meals and snacks. It is important for people with diabetes to manage their blood glucose (sugar) levels. Choosing the right foods and the right amounts throughout your day will help control your blood glucose. Eating right can even help you improve your blood pressure and reach or maintain a healthy weight. CARBOHYDRATE COUNTING MADE EASY When you eat carbohydrates, they turn to sugar. This raises your blood glucose level. Counting carbohydrates can help you control this level so you feel better. When you plan your meals by counting carbohydrates, you can have more flexibility in what you eat and balance your medicine with your food  intake. Carbohydrate counting simply means adding up the total amount of carbohydrate grams in your meals and snacks. Try to eat about the same amount at each meal. Foods with carbohydrates are listed below. Each portion below is 1 carbohydrate serving or 15 grams of carbohydrates. Ask your dietician how many grams of carbohydrates you should eat at each meal or snack. Grains and Starches  1 slice bread.   English muffin or hotdog/hamburger bun.   cup cold cereal (unsweetened).   cup cooked pasta or rice.   cup starchy vegetables (corn, potatoes, peas, beans, winter squash).  1 tortilla (6 inches).   bagel.  1 waffle or pancake (size of a CD).   cup cooked cereal.  4 to 6 small crackers. *Whole grain is recommended. Fruit  1 cup fresh unsweetened berries, melon, papaya, pineapple.  1 small fresh fruit.   banana or mango.   cup fruit juice (4 oz unsweetened).   cup canned fruit in natural juice or water.  2 tbs dried fruit.  12 to 15 grapes or cherries. Milk and Yogurt  1 cup fat-free or 1% milk.  1 cup soy milk.  6 oz light yogurt with sugar-free sweetener.  6 oz low-fat soy yogurt.  6 oz plain yogurt. Vegetables  1 cup raw or  cup cooked is counted as 0 carbohydrates or a "free" food.  If you eat 3 or more servings at 1 meal, count them as 1 carbohydrate serving. Other Carbohydrates   oz chips or pretzels.  cup ice cream or frozen yogurt.   cup sherbet or sorbet.  2 inch square cake, no frosting.  1 tbs honey, sugar, jam, jelly, or syrup.  2 small cookies.  3 squares of graham crackers.  3 cups popcorn.  6 crackers.  1 cup broth-based soup.  Count 1 cup casserole or other mixed foods as 2 carbohydrate servings.  Foods with less than 20 calories in a serving may be counted as 0 carbohydrates or a "free" food. You may want to purchase a book or computer software that lists the carbohydrate gram counts of different foods. In  addition, the nutrition facts panel on the labels of the foods you eat are a good source of this information. The label will tell you how big the serving size is and the total number of carbohydrate grams you will be eating per serving. Divide this number by 15 to obtain the number of carbohydrate servings in a portion. Remember, 1 carbohydrate serving equals 15 grams of carbohydrate. SERVING SIZES Measuring foods and serving sizes helps you make sure you are getting the right amount of food. The list below tells how big or small some common serving sizes are.  1 oz.........4 stacked dice.  3 oz........Marland KitchenDeck of cards.  1 tsp.......Marland KitchenTip of little finger.  1 tbs......Marland KitchenMarland KitchenThumb.  2 tbs.......Marland KitchenGolf ball.   cup......Marland KitchenHalf of a fist.  1 cup.......Marland KitchenA fist. SAMPLE DIABETES MEAL PLAN Below is a sample meal plan that includes foods from the grain and starches, dairy, vegetable, fruit, and meat groups. A dietician can individualize a meal plan to fit your calorie needs and tell you the number of servings needed from each food group. However, controlling the total amount of carbohydrates in your meal or snack is more important than making sure you include all of the food groups at every meal. You may interchange carbohydrate containing foods (dairy, starches, and fruits). The meal plan below is an example of a 2000 calorie diet using carbohydrate counting. This meal plan has 17 carbohydrate servings. Breakfast  1 cup oatmeal (2 carb servings).   cup light yogurt (1 carb serving).  1 cup blueberries (1 carb serving).   cup almonds. Snack  1 large apple (2 carb servings).  1 low-fat string cheese stick. Lunch  Chicken breast salad.  1 cup spinach.   cup chopped tomatoes.  2 oz chicken breast, sliced.  2 tbs low-fat [place] dressing.  12 whole-wheat crackers (2 carb servings).  12 to 15 grapes (1 carb serving).  1 cup low-fat milk (1 carb serving). Snack  1 cup carrots.    cup hummus (1 carb serving). Dinner  3 oz broiled salmon.  1 cup brown rice (3 carb servings). Snack  1  cups steamed broccoli (1 carb serving) drizzled with 1 tsp olive oil and lemon juice.  1 cup light pudding (2 carb servings). DIABETES MEAL PLANNING WORKSHEET Your dietician can use this worksheet to help you decide how many servings of foods and what types of foods are right for you.  BREAKFAST Food Group and Servings / Carb Servings Grain/Starches __________________________________ Dairy __________________________________________ Vegetable ______________________________________ Fruit ___________________________________________ Meat __________________________________________ Fat ____________________________________________ LUNCH Food Group and Servings / Carb Servings Grain/Starches ___________________________________ Dairy ___________________________________________ Fruit ____________________________________________ Meat ___________________________________________ Fat _____________________________________________ Laural Golden Food Group and Servings / Carb Servings Grain/Starches ___________________________________ Dairy ___________________________________________ Fruit ____________________________________________ Meat ___________________________________________ Fat _____________________________________________ SNACKS Food Group and Servings / Carb Servings Grain/Starches ___________________________________ Dairy ___________________________________________ Vegetable _______________________________________ Fruit ____________________________________________ Meat ___________________________________________ Fat _____________________________________________ DAILY TOTALS Starches _________________________ Vegetable ________________________  Fruit ____________________________ Dairy ____________________________ Meat ____________________________ Fat  ______________________________ Document Released: 10/21/2004 Document Revised: 04/18/2011 Document Reviewed: 09/01/2008 [facility] Patient Information 2014 [place], [place].

## 2012-07-25 NOTE — Assessment & Plan Note (Addendum)
Patient is very talkative and with almost pressured speech, perseverates on her daughter and her daughter's medical conditions. Mild tremor noted on exam today, and patient does not appear to have had TSH checked in the past. She is tachycardic to 100 today and has a history of tachycardia in our clinic. - Will check a TSH today.   ATTENDING A&P Agree with above. Need to check TSH to R/O hyperthyroidism as has never been checked. Other possibilities (like pheo) will need considered after she gets a payor source or pt willing to pay out of pocket.

## 2012-07-25 NOTE — Assessment & Plan Note (Addendum)
We did refer her to [facility] scholarship program but all other HM needs deferred until payor source or willing to pay out of pocket.   Referral to [facility].

## 2012-07-25 NOTE — Assessment & Plan Note (Signed)
She denies caffeine intake inc coffee, soft drinks, monster drinks, energy drinks etc. Denies illicit drugs. Agree with checking TSH. Consider BB as additional BP agent.

## 2012-07-25 NOTE — Assessment & Plan Note (Addendum)
BP Readings from Last 3 Encounters:  07/25/12 137/92  07/28/11 111/70  07/20/11 148/67    Lab Results  Component Value Date   NA 132* 07/20/2011   K 4.8 07/20/2011   CREATININE 1.70* 07/20/2011    Assessment: Blood pressure control:  unknown Progress toward BP goal:   N/A Comments: Patient has not been seen in a year and does not regularly check BP at home. She is obviously anxious in clinic, so there is considerable question as to whether today's BP is her current baseline.  Plan: Medications:  continue current medications Educational resources provided: brochure Self management tools provided: home blood pressure logbook Other plans: Patient will start taking BP at home using her machine that she already owns. Will obtain a BMP today to monitor electrolytes for patient on ACE Inhibitor.    ATTENDING A&P: Agree with above. Cont current med but ask that pt record BP's at home. If need to escalate meds, can double current dose or add BB (tachycardia).

## 2012-07-25 NOTE — Assessment & Plan Note (Addendum)
Patient has been compliant with simvastatin 40mg  which she obtains for $3.99 through [facility] prescription program. - Continue simvastatin - Will check a lipid panel today.  ATTENDING A&P: Agree with above. Check FLP. Goal is LDL < 100. Cont statin.

## 2012-07-25 NOTE — Progress Notes (Signed)
This is a Psychologist, occupational Note.  The care of the patient was discussed with Dr. Rogelia Boga and the assessment and plan was formulated with their assistance.  Please see their note for official documentation of the patient encounter.   Subjective:   Patient ID: [name] female   DOB: [birth year] 57 y.o.   MRN: [id number]  HPI: [name] is a 57 y.o. female with a history of HTN, HLD, poorly controlled DM and CKD presenting for annual exam. She is very anxious and perseverates on her daughter's medical conditions which include CP and a recent diagnosis of POTS. 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  With respect to her hypertension, the patient reports compliance with her enalapril/HCTZ. She doesn't regularly check blood pressures at home despite having a manometer, and she doesn't remember what her checks recent checks have been. She is willing to start checking her blood pressure more regularly. She denies chest pain, SOB, orthopnea, or PND. She is now walk 10-15 minutes per day, though not always on days when it is too hot. She drinks water and rarely some diet sodas. Her normal diet varies, but she eats vegetables and grill/baked meats. She tries to minimize fried foods but will eat some fast foods. She thinks she may need to see a nutritionist.   With respect to her diabetes, the patient is unable to check her sugars at home due to financial constraints. She reports compliance with glipizide 10mg  twice daily with meals but doesn't think that it is doing enough. She wishes to add a second medication but is adamant that she does not want to try insulin. She also does not have the financial stability to try other medications (metformin is not an option due to kidney function). Her meals are as above and she tries to avoid sweets. She experiences  urinary frequency, especially at night, but she is not leaking urine or complaining of incomplete emptying. Her urine is clear, never bloody. Patient denies excessive thirst. She is not surprised that her blood sugars are poor today but cannot elaborate as to why.   Past Medical History  Diagnosis Date  . Diabetes mellitus type II, uncontrolled   . Hypertension   . Hyperlipidemia   . Ovarian cyst, left   . Anemia     due to menorrhagia, BL 8-10  . Vaginal cyst     nabothian and bartholin  . CKD (chronic kidney disease) stage 3, GFR 30-59 ml/min     baseline creatinine 1.4-1.7  . Anxiety   . Depression   . Postmenopausal bleeding 06/12/2008  . Congenital heart defect     surgically corrected as a child   Current Outpatient Prescriptions  Medication Sig Dispense Refill  . acetaminophen (TYLENOL) 325 MG tablet Take 650 mg by mouth 2 (two) times daily as needed. For pain      . Enalapril-Hydrochlorothiazide 5-12.5 MG per tablet Take 1 tablet by mouth daily.  30 tablet  0  . glipiZIDE (GLUCOTROL) 10 MG tablet Take 1 tablet (10 mg total) by mouth 2 (two) times daily before a meal.  30 tablet  0  . ranitidine (ZANTAC) 150 MG tablet Take 150 mg by mouth 2 (two) times daily.      . simvastatin (ZOCOR) 40 MG tablet Take 1 tablet (40 mg total) by mouth at bedtime.  30 tablet  0  . [DISCONTINUED] pantoprazole (PROTONIX) 20 MG tablet Take 2 tablets (40 mg total) by mouth daily.  30 tablet  1  . [DISCONTINUED] sertraline (ZOLOFT) 100 MG tablet Take 1 tablet (100 mg total) by mouth daily.  30 tablet  2   No current facility-administered medications for this visit.   Family History  Problem Relation Age of Onset  . Stroke Father   . Heart attack Father     Had MI in his 43s  . Stomach cancer Paternal Grandmother   . Diabetes Maternal Grandmother   . Cerebral palsy Daughter   . Anesthesia problems Neg Hx   . Hypotension Neg Hx   . Malignant hyperthermia Neg Hx   . Pseudochol deficiency Neg  Hx    History   Social History  . Marital Status: Divorced    Spouse Name: N/A    Number of Children: 1  . Years of Education: 12th grade   Occupational History  . 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 Smoking status: Never Smoker   . Smokeless tobacco: Former Neurosurgeon  . Alcohol Use: No  . Drug Use: No  . Sexually Active: None   Other Topics Concern  . 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   Review of Systems: A comprehensive 12 point review of systems was performed and is negative except as stated above. Objective:  Physical Exam: Filed Vitals:   07/25/12 1319  BP: 137/92  Pulse: 100  Temp: 97.4 F (36.3 C)  TempSrc: Oral  Height: 5\' 5"  (1.651 m)  Weight: 162 lb 11.2 oz (73.8 kg)  SpO2: 98%   General appearance: alert, cooperative and no distress Head: Normocephalic, without obvious abnormality, atraumatic Eyes: PERRL, EOMI. no scleral/conjunctival injection. sclera anicteric. Throat: moist mucus membranes. Neck: no adenopathy, supple, symmetrical, trachea midline and no thyromegaly. Lungs: clear to auscultation bilaterally and normal work of breathing Heart: regular rate and rhythm, S1, S2 normal, no murmur, click, rub or gallop Abdomen: soft, non-tender; bowel sounds normal; no masses,  no organomegaly Extremities: extremities normal, atraumatic, no cyanosis or edema Pulses: 2+ and symmetric Skin: Skin color, texture, turgor normal. No rashes or lesions Neuro: FtN intact Assessment & Plan:  [name] is a 57 y.o. female with HTN, HLD, and uncontrolled DM with CKD presenting for follow up of these conditions.  Diabetes mellitus type II, uncontrolled Lab Results  Component Value Date   HGBA1C 13.1 07/25/2012   HGBA1C 11.4* 07/20/2011   HGBA1C 11.7 04/28/2011    Assessment: Diabetes control:  uncontroled Progress toward A1C goal:   Unsuccessful Comments: Patient is not willing to  try insulin at this point and is poorly managed on diet/exercise and glipizide 10mg  BID. CBG today is 322.  Plan: Medications:  Increased glipizide to 20mg  BID with meals. Home glucose monitoring: None - patient is unable to afford the supplies Frequency:   Timing:   Instruction/counseling given: reminded to bring medications to each visit, discussed diet and other instruction/counseling: discussed the need for financial assistance, specifically the orange card, so that the patient can afford diabetic testing supplies. Educational resources provided: brochure Self management tools provided:   Other plans: Would like to try insulin, but patient refuses. Will check a BMP today to follow Cr and kidney function. Do not believe that the patient is in DKA as this current elevated CBG is consistent with her previous  very elevated CBG.     HYPERLIPIDEMIA Patient has been compliant with simvastatin 40mg  which she obtains for $3.99 through [facility] prescription program. - Continue simvastatin - Will check a lipid panel today.    HYPERTENSION BP Readings from Last 3 Encounters:  07/25/12 137/92  07/28/11 111/70  07/20/11 148/67    Lab Results  Component Value Date   NA 132* 07/20/2011   K 4.8 07/20/2011   CREATININE 1.70* 07/20/2011    Assessment: Blood pressure control:  unknown Progress toward BP goal:   N/A Comments: Patient has not been seen in a year and does not regularly check BP at home. She is obviously anxious in clinic, so there is considerable question as to whether today's BP is her current baseline.  Plan: Medications:  continue current medications Educational resources provided: brochure Self management tools provided: home blood pressure logbook Other plans: Patient will start taking BP at home using her machine that she already owns. Will obtain a BMP today to monitor electrolytes for patient on ACE Inhibitor.   ANXIETY STATE, UNSPECIFIED Patient is very  talkative and with almost pressured speech, perseverates on her daughter and her daughter's medical conditions. Mild tremor noted on exam today, and patient does not appear to have had TSH checked in the past. She is tachycardic to 100 today and has a history of tachycardia in our clinic. - Will check a TSH today.    Health Maintenance - Patient has not had a mammogram since 2009 (BIRADS 1 - negative). She is due for another mammogram and was provided with information on financial assistance.  FOLLOW UP: Return visit in 1 month.

## 2012-07-25 NOTE — Progress Notes (Signed)
  Subjective:    Patient ID: [name], female    DOB: 07-17-1955, 57 y.o.   MRN: [id number]  HPI  Ms Salberg presents after one year's abscense.   1. DM - she takes glipizide 20 BID and states has not run out of recently. Doesn't check CBG's bc has no test strips and no insurance. States she knows what to eat and is trying to walk daily. Doesn't want to go on insulin and states she can't afford it anyway.   2. HTN - takes enalapril HCTZ 5/12.5 daily. States hasn't missed a dose. Has BP cuff at home but doesn't use it. Her historic BP jump up and down.   3. Hyperlipidemia - She is on her statin and hasn't missed any doses. Her last LDL was 3/13 and was 79.  4. Tremor - she has a fine tremor of her hands B and states that her sister noticed it recently. She states has been there months but is no big deal.  Review of Systems  Constitutional: Positive for activity change. Negative for appetite change and unexpected weight change.  HENT: Negative for rhinorrhea.   Eyes: Negative for redness.  Respiratory: Negative for shortness of breath.   Cardiovascular: Negative for palpitations.  Gastrointestinal: Negative for abdominal pain.  Endocrine: Positive for polyuria. Negative for polydipsia.  Genitourinary: Negative for dysuria.  Musculoskeletal: Negative for gait problem.  Skin: Negative for rash.  Neurological: Positive for tremors.       Objective:   Physical Exam  Constitutional: She is oriented to person, place, and time. She appears well-developed and well-nourished. No distress.  HENT:  Head: Normocephalic and atraumatic.  Right Ear: External ear normal.  Left Ear: External ear normal.  Nose: Nose normal.  Mouth/Throat: Oropharynx is clear and moist. No oropharyngeal exudate.  Eyes: EOM are normal. Pupils are equal, round, and reactive to light. Right eye exhibits no discharge. Left eye exhibits no discharge. Right conjunctiva is injected. Left conjunctiva is injected. No  scleral icterus.  Neck: Normal range of motion. Neck supple. No thyromegaly present.  Cardiovascular: Normal rate, regular rhythm, normal heart sounds and intact distal pulses.  Exam reveals no gallop and no friction rub.   No murmur heard. Pulmonary/Chest: Effort normal and breath sounds normal.  Abdominal: Soft. Bowel sounds are normal.  Musculoskeletal: Normal range of motion. She exhibits no edema and no tenderness.  Lymphadenopathy:    She has no cervical adenopathy.  Neurological: She is alert and oriented to person, place, and time. She displays tremor.  Fine tremor hands B at rest. No intention tremor. Finger to nose OK  Skin: Skin is warm and dry. She is not diaphoretic. No erythema.  Psychiatric: Thought content normal. Her mood appears anxious. Her speech is rapid and/or pressured and tangential. Her speech is not delayed and not slurred. She is hyperactive. She is not agitated, not aggressive, not slowed, not withdrawn, not actively hallucinating and not combative. Cognition and memory are normal. She expresses impulsivity. She does not exhibit a depressed mood. She is communicative. She is attentive.          Assessment & Plan:

## 2012-07-26 LAB — BASIC METABOLIC PANEL WITH GFR
Calcium: 9.9 mg/dL (ref 8.4–10.5)
Chloride: 94 mEq/L — ABNORMAL LOW (ref 96–112)
Creat: 1.48 mg/dL — ABNORMAL HIGH (ref 0.50–1.10)
GFR, Est African American: 46 mL/min — ABNORMAL LOW
GFR, Est Non African American: 40 mL/min — ABNORMAL LOW
Sodium: 132 mEq/L — ABNORMAL LOW (ref 135–145)

## 2012-07-26 LAB — LIPID PANEL
Cholesterol: 188 mg/dL (ref 0–200)
HDL: 39 mg/dL — ABNORMAL LOW (ref >39)
Total CHOL/HDL Ratio: 4.8 Ratio
Triglycerides: 477 mg/dL — ABNORMAL HIGH (ref <150)

## 2012-07-26 LAB — TSH: TSH: 1.743 u[IU]/mL (ref 0.350–4.500)

## 2012-07-26 NOTE — Addendum Note (Signed)
Addended by: Blanch Media A on: 07/26/2012 09:10 AM   Modules accepted: Orders

## 2012-07-31 ENCOUNTER — Other Ambulatory Visit: Payer: Self-pay | Admitting: Internal Medicine

## 2012-08-06 ENCOUNTER — Other Ambulatory Visit: Payer: Self-pay | Admitting: Internal Medicine

## 2012-08-16 ENCOUNTER — Other Ambulatory Visit: Payer: Self-pay

## 2012-08-26 ENCOUNTER — Other Ambulatory Visit: Payer: Self-pay | Admitting: Internal Medicine

## 2012-08-27 NOTE — Telephone Encounter (Signed)
She has appt tomorrow and must keep appt to cont to receive refills.

## 2012-08-28 ENCOUNTER — Encounter: Payer: Self-pay | Admitting: Internal Medicine

## 2012-08-30 ENCOUNTER — Other Ambulatory Visit: Payer: Self-pay | Admitting: Internal Medicine

## 2012-08-30 NOTE — Telephone Encounter (Signed)
Pt needs to keep the 8/26 appt to cont to receive refills

## 2012-09-05 ENCOUNTER — Encounter: Payer: Self-pay | Admitting: Internal Medicine

## 2012-09-05 ENCOUNTER — Ambulatory Visit (INDEPENDENT_AMBULATORY_CARE_PROVIDER_SITE_OTHER): Payer: Self-pay | Admitting: Internal Medicine

## 2012-09-05 VITALS — BP 151/94 | HR 95 | Temp 96.5°F | Ht 63.5 in | Wt 162.4 lb

## 2012-09-05 DIAGNOSIS — M79609 Pain in unspecified limb: Secondary | ICD-10-CM

## 2012-09-05 DIAGNOSIS — M25519 Pain in unspecified shoulder: Secondary | ICD-10-CM

## 2012-09-05 DIAGNOSIS — I10 Essential (primary) hypertension: Secondary | ICD-10-CM

## 2012-09-05 DIAGNOSIS — N058 Unspecified nephritic syndrome with other morphologic changes: Secondary | ICD-10-CM

## 2012-09-05 DIAGNOSIS — M25512 Pain in left shoulder: Secondary | ICD-10-CM

## 2012-09-05 DIAGNOSIS — E1129 Type 2 diabetes mellitus with other diabetic kidney complication: Secondary | ICD-10-CM

## 2012-09-05 MED ORDER — ACETAMINOPHEN 325 MG PO TABS: 650.0000 mg | ORAL_TABLET | ORAL | Status: DC | PRN

## 2012-09-05 NOTE — Progress Notes (Signed)
Case discussed with Dr. Qureshi (at time of visit, soon after the resident saw the patient).  We reviewed the resident's history and exam and pertinent patient test results.  I agree with the assessment, diagnosis, and plan of care documented in the resident's note.  

## 2012-09-05 NOTE — Patient Instructions (Signed)
General Instructions: Please STOP taking the 500mg  of tylenol tablets.    Instead, Please get the 325mg  of tylenol tablets that can be found over the counter and you can take up to 2 tablets (total 650mg ) every 4 hours as needed for pain.    Try using heat to the area for relief, continue stretches.  If your pain does not improve by one week, call your pcp [phone] and let them know.  Please continue to take your diabetes and blood pressure medications as prescribed and come see your primary care physician [name] next month as scheduled.  I hope you feel better soon.  Treatment Goals:  Goals (1 Years of Data) as of 09/05/12         07/25/12 07/20/11     Result Component    . HEMOGLOBIN A1C < 7.0  13.1 11.4    . HEMOGLOBIN A1C < 7.0  13.1 11.4    . LDL CALC < 100       . LDL CALC < 100       . LDL CALC < 100         Progress Toward Treatment Goals:  Treatment Goal 09/05/2012  Hemoglobin A1C unable to assess  Blood pressure deteriorated    Self Care Goals & Plans:  Self Care Goal 09/05/2012  Manage my medications take my medicines as prescribed; bring my medications to every visit; refill my medications on time; follow the sick day instructions if I am sick  Monitor my health keep track of my blood pressure; check my feet daily  Eat healthy foods eat more vegetables; eat fruit for snacks and desserts; eat baked foods instead of fried foods; eat foods that are low in salt; eat smaller portions; drink diet soda or water instead of juice or soda  Be physically active find an activity I enjoy; take a walk every day    Home Blood Glucose Monitoring 09/05/2012  When to check my blood sugar before meals     Care Management & Community Referrals:  HEAT AND COLD  Cold treatment (icing) relieves pain and reduces inflammation. Cold treatment should be applied for 10 to 15 minutes every 2 to 3 hours for inflammation and pain and immediately after any activity that aggravates your  symptoms. Use ice packs or massage the area with a piece of ice (ice massage).  Heat treatment may be used prior to performing the stretching and strengthening activities prescribed by your caregiver, physical therapist, or athletic trainer. Use a heat pack or soak your injury in warm water. SEEK MEDICAL CARE IF:   Symptoms get worse or do not improve despite treatment.  Pain becomes intolerable.  You experience numbness or tingling.  Toes or fingernails become cold or develop a blue, gray, or dusky color.  New, unexplained symptoms develop (drugs used in treatment may produce side effects). Document Released: 01/24/2005 Document Revised: 04/18/2011 Document Reviewed: 05/08/2008 [facility] Patient Information 2014 [place], [place].

## 2012-09-05 NOTE — Progress Notes (Signed)
Subjective:   Patient ID: [name] female   DOB: [birth year] 57 y.o.   MRN: [id number]  HPI: [name] is a 57 y.o. female with PMH of HTN, HLD, poorly controlled DM and CKD stage 3 presenting to [facility] today for acute visit with complaints of shoulder pain.  She complains of left shoulder/upper back pain worse with movement for approximately 3-4 days. She says tylenol resolves her pain but she has been taking approximately 500mg  extra strength tylenol tablets x2 q4 hours for the past couple of days.  She did run out of her brand name tablets and refilled her container with generic but she does not remember if the generic is extra strength or not.  She denies any fall but says she does a lot of lifting daily with her handicap daughter and thinks she may have pulled something when she was carrying lots of heavy grocery bags in one go up to her home.  She says she does that often and she cant remember exactly when the pain started and what she was doing.  She has not tried icing or applying heat to the area.  Her range of motion is intact and denies any numbness or weakness. Currently, she does not have any pain because she took tylenol but says she can kind of feel it coming on.   She is an anxious woman who says she has been under a lot of stress lately with her daughter and continues to talk fast about her daughters numerous doctor appointments and changing doctors and then returns to say she does so many things in one day and then goes back to talking about her daughter.   We discussed some strategies to try to decrease her stress at home with relaxation techniques and taking some time for herself if possible and also trying to get adequate sleep.  Additionally, I stressed the need for improved diabetes and hypertension control and to bring her her meter and medications to her follow up visit with her pcp.  She may benefit from CDE visit as well.    Past Medical History  Diagnosis Date  .  Diabetes mellitus type II, uncontrolled   . Hypertension   . Hyperlipidemia   . Ovarian cyst, left   . Anemia     due to menorrhagia, BL 8-10  . Vaginal cyst     nabothian and bartholin  . CKD (chronic kidney disease) stage 3, GFR 30-59 ml/min     baseline creatinine 1.4-1.7  . Anxiety   . Depression   . Postmenopausal bleeding 06/12/2008  . Congenital heart defect     surgically corrected as a child   Current Outpatient Prescriptions  Medication Sig Dispense Refill  . acetaminophen (TYLENOL) 325 MG tablet Take 650 mg by mouth 2 (two) times daily as needed. For pain      . Enalapril-Hydrochlorothiazide 5-12.5 MG per tablet TAKE 1 TABLET BY MOUTH DAILY.  30 tablet  0  . glipiZIDE (GLUCOTROL) 10 MG tablet TAKE 2 TABLETS (20 MG TOTAL) BY MOUTH 2 (TWO) TIMES DAILY BEFORE A MEAL.  120 tablet  0  . ranitidine (ZANTAC) 150 MG tablet Take 150 mg by mouth 2 (two) times daily.      . simvastatin (ZOCOR) 40 MG tablet TAKE 1 TABLET (40 MG TOTAL) BY MOUTH AT BEDTIME.  30 tablet  2  . [DISCONTINUED] pantoprazole (PROTONIX) 20 MG tablet Take 2 tablets (40 mg total) by mouth daily.  30 tablet  1  . [  DISCONTINUED] sertraline (ZOLOFT) 100 MG tablet Take 1 tablet (100 mg total) by mouth daily.  30 tablet  2   No current facility-administered medications for this visit.   Family History  Problem Relation Age of Onset  . Stroke Father   . Heart attack Father     Had MI in his 38s  . Stomach cancer Paternal Grandmother   . Diabetes Maternal Grandmother   . Cerebral palsy Daughter   . Anesthesia problems Neg Hx   . Hypotension Neg Hx   . Malignant hyperthermia Neg Hx   . Pseudochol deficiency Neg Hx    History   Social History  . Marital Status: Divorced    Spouse Name: N/A    Number of Children: 1  . Years of Education: 12th grade   Occupational History  . 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 Smoking status: Never Smoker   . Smokeless tobacco:  Former Neurosurgeon  . Alcohol Use: No  . Drug Use: No  . Sexually Active: None   Other Topics Concern  . 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   Review of Systems:  Constitutional:  Fatigue.  Denies fever, chills, diaphoresis, appetite change.  HEENT:  Denies congestion  Respiratory:  Denies SOB and wheezing.   Cardiovascular:  Denies chest pain, palpitations, and leg swelling.   Gastrointestinal:  Denies nausea, vomiting, abdominal pain, diarrhea   Genitourinary:  Denies dysuria, urgency  Musculoskeletal:  Shoulder pain.    Skin:  Denies pallor, rash and wound.   Neurological/Psych:  Anxiety. Denies dizziness and headaches.    Objective:  Physical Exam: Filed Vitals:   09/05/12 1315  BP: 151/94  Pulse: 95  Temp: 96.5 F (35.8 C)  TempSrc: Oral  Height: 5' 3.5" (1.613 m)  Weight: 162 lb 6.4 oz (73.664 kg)  SpO2: 98%   Vitals reviewed. General: sitting in chair, anxious female HEENT: PERRL, EOMI Cardiac: RRR, no rubs, murmurs or gallops Pulm: clear to auscultation bilaterally, no wheezes, rales, or rhonchi Abd: soft, nontender, nondistended, BS present Ext: warm and well perfused, no pedal edema, +2DP B/L, no deficits in ROM of all extremities, no tenderness to palpation of left shoulder or back, able to raise both arms above head and reach backwards but does report mild discomfort when moving her arm.  No visible erythema, ecchymosis, or rash.   Neuro: alert and oriented X3, cranial nerves II-XII grossly intact, strength and sensation to light touch equal in bilateral upper and lower extremities.   Assessment & Plan:  Discussed with Dr. Aundria Rud Left shoulder pain: Tylenol 325mg  prn and supportive measures

## 2012-09-07 DIAGNOSIS — M25512 Pain in left shoulder: Secondary | ICD-10-CM | POA: Insufficient documentation

## 2012-09-07 NOTE — Assessment & Plan Note (Signed)
BP Readings from Last 3 Encounters:  09/05/12 151/94  07/25/12 137/92  07/28/11 111/70   Lab Results  Component Value Date   NA 132* 07/25/2012   K 3.9 07/25/2012   CREATININE 1.48* 07/25/2012   Assessment: Blood pressure control: mildly elevated Progress toward BP goal:  deteriorated Comments: did not recall taking medication this morning.  Could also be elevated in setting of recent stress and discomfort.  Plan: Medications:  Enalapril-hctz to continue Educational resources provided: brochure;handout;video

## 2012-09-07 NOTE — Assessment & Plan Note (Signed)
Lab Results  Component Value Date   HGBA1C 13.1 07/25/2012   HGBA1C 11.4* 07/20/2011   HGBA1C 11.7 04/28/2011    Assessment: Diabetes control: poor control (HgbA1C >9%) Progress toward A1C goal:  unable to assess Comments: did not bring her meter to her visit today  Plan: Medications: continue glipizide 10mg   Home glucose monitoring: Frequency:   Timing: before meals Instruction/counseling given: reminded to bring meter and medications to visit, keep track of sugars and check regularly, get eye exam and foot exam done Educational resources provided: brochure;handout Self management tools provided:   Other plans: may benefit from CDE visit however she is very busy with the care of her daughter

## 2012-09-07 NOTE — Assessment & Plan Note (Signed)
x3-4 days, ROM intact, denies fall but does recall lifting heavy objects and possibly "pulling something" at that time.  Pain relieved with tylenol but claims to be taking 1000mg  q4 hours for the past couple of days.  No visible erythema, contusions, and non-tender to palpation.  No numbness or tingling or weakness.    ?mild strain secondary to lifting heavy groceries and helping her daughter with activities.   -supportive measures at this time, decreased tylenol to 325mg  po 1-2 tablets up to q4 hours prn as needed  -NSAIDs not recommended due to CKD -try heat to affected area, rest and mild stretches as tolerated slowly to increase activity -advised to follow up if pain does not improve or get worse or if she notices any weakness, numbness, tingling, or burning.

## 2012-09-11 ENCOUNTER — Telehealth: Payer: Self-pay | Admitting: *Deleted

## 2012-09-11 ENCOUNTER — Emergency Department (HOSPITAL_COMMUNITY)
Admission: EM | Admit: 2012-09-11 | Discharge: 2012-09-11 | Disposition: A | Discharge status: Home / Self Care | Payer: Self-pay | Attending: Emergency Medicine | Admitting: Emergency Medicine

## 2012-09-11 DIAGNOSIS — I129 Hypertensive chronic kidney disease with stage 1 through stage 4 chronic kidney disease, or unspecified chronic kidney disease: Secondary | ICD-10-CM | POA: Insufficient documentation

## 2012-09-11 DIAGNOSIS — Z8742 Personal history of other diseases of the female genital tract: Secondary | ICD-10-CM | POA: Insufficient documentation

## 2012-09-11 DIAGNOSIS — M545 Low back pain, unspecified: Secondary | ICD-10-CM | POA: Insufficient documentation

## 2012-09-11 DIAGNOSIS — Z7982 Long term (current) use of aspirin: Secondary | ICD-10-CM | POA: Insufficient documentation

## 2012-09-11 DIAGNOSIS — K5900 Constipation, unspecified: Secondary | ICD-10-CM | POA: Insufficient documentation

## 2012-09-11 DIAGNOSIS — R350 Frequency of micturition: Secondary | ICD-10-CM | POA: Insufficient documentation

## 2012-09-11 DIAGNOSIS — Z8679 Personal history of other diseases of the circulatory system: Secondary | ICD-10-CM | POA: Insufficient documentation

## 2012-09-11 DIAGNOSIS — E1129 Type 2 diabetes mellitus with other diabetic kidney complication: Secondary | ICD-10-CM | POA: Insufficient documentation

## 2012-09-11 DIAGNOSIS — M549 Dorsalgia, unspecified: Secondary | ICD-10-CM

## 2012-09-11 DIAGNOSIS — N183 Chronic kidney disease, stage 3 unspecified: Secondary | ICD-10-CM | POA: Insufficient documentation

## 2012-09-11 DIAGNOSIS — E785 Hyperlipidemia, unspecified: Secondary | ICD-10-CM | POA: Insufficient documentation

## 2012-09-11 DIAGNOSIS — Z79899 Other long term (current) drug therapy: Secondary | ICD-10-CM | POA: Insufficient documentation

## 2012-09-11 DIAGNOSIS — Z862 Personal history of diseases of the blood and blood-forming organs and certain disorders involving the immune mechanism: Secondary | ICD-10-CM | POA: Insufficient documentation

## 2012-09-11 LAB — URINALYSIS, ROUTINE W REFLEX MICROSCOPIC
Glucose, UA: >1000 mg/dL — AB
Protein, ur: NEGATIVE mg/dL
Specific Gravity, Urine: 1.01 (ref 1.005–1.030)
Urobilinogen, UA: 0.2 mg/dL (ref 0.0–1.0)

## 2012-09-11 LAB — URINE MICROSCOPIC-ADD ON

## 2012-09-11 NOTE — Telephone Encounter (Signed)
I agree with plan for an appointment.

## 2012-09-11 NOTE — ED Provider Notes (Signed)
CSN: [id number]     Arrival date & time 09/11/12  0810 History     First MD Initiated Contact with Patient 09/11/12 [phone]     Chief Complaint  Patient presents with  . Back Pain   HPI 57 year old female with DM-2, HTN, HLD, and CKD presents with back pain.  Patient reports that she developed low back pain last night after she went to the refrigerator for something to eat.  Pain was 7/10 in severity and non-radiating.  No injury, fall, or trauma. She took tylenol and pain subsequently improved.  This am, the pain began to return so she came to the ED for evaluation.  Currently, she reports minimal pain (1/10 in severity), but states that she does not feel like herself.   Past Medical History  Diagnosis Date  . Diabetes mellitus type II, uncontrolled   . Hypertension   . Hyperlipidemia   . Ovarian cyst, left   . Anemia     due to menorrhagia, BL 8-10  . Vaginal cyst     nabothian and bartholin  . CKD (chronic kidney disease) stage 3, GFR 30-59 ml/min     baseline creatinine 1.4-1.7  . Anxiety   . Depression   . Postmenopausal bleeding 06/12/2008  . Congenital heart defect     surgically corrected as a child   Past Surgical History  Procedure Laterality Date  . Cardiac surgery      to repair congenital defect as a child   Family History  Problem Relation Age of Onset  . Stroke Father   . Heart attack Father     Had MI in his 29s  . Stomach cancer Paternal Grandmother   . Diabetes Maternal Grandmother   . Cerebral palsy Daughter   . Anesthesia problems Neg Hx   . Hypotension Neg Hx   . Malignant hyperthermia Neg Hx   . Pseudochol deficiency Neg Hx    History  Substance Use Topics  . Smoking status: Never Smoker   . Smokeless tobacco: Former Neurosurgeon  . Alcohol Use: No   OB History   Grav Para Term Preterm Abortions TAB SAB Ect Mult Living   3 1 1  2  2   1      Review of Systems  Constitutional: Negative for fever and chills.  Respiratory: Negative for chest  tightness and shortness of breath.   Cardiovascular: Negative for chest pain and leg swelling.  Gastrointestinal: Negative for nausea, vomiting and abdominal pain.       Patient reports occasional constipation.  Genitourinary: Positive for frequency. Negative for difficulty urinating.  Musculoskeletal: Positive for back pain.  Skin: Negative for rash.  Neurological: Negative for weakness and numbness.    Allergies  Review of patient's allergies indicates no known allergies.  Home Medications   Current Outpatient Rx  Name  Route  Sig  Dispense  Refill  . acetaminophen (TYLENOL) 325 MG tablet   Oral   Take 2 tablets (650 mg total) by mouth every 4 (four) hours as needed for pain. For pain         . aspirin 81 MG tablet   Oral   Take 81 mg by mouth daily.         . Enalapril-Hydrochlorothiazide 5-12.5 MG per tablet      TAKE 1 TABLET BY MOUTH DAILY.   30 tablet   0   . glipiZIDE (GLUCOTROL) 10 MG tablet      TAKE 2 TABLETS (20 MG  TOTAL) BY MOUTH 2 (TWO) TIMES DAILY BEFORE A MEAL.   120 tablet   0   . simvastatin (ZOCOR) 40 MG tablet      TAKE 1 TABLET (40 MG TOTAL) BY MOUTH AT BEDTIME.   30 tablet   2    BP 131/65  Pulse 97  Temp(Src) 98.6 F (37 C) (Oral)  SpO2 93% Physical Exam  Constitutional: She is oriented to person, place, and time. She appears well-developed and well-nourished.  Appears anxious  HENT:  Head: Normocephalic and atraumatic.  Cardiovascular: Normal rate and regular rhythm.   No murmur heard. Pulmonary/Chest: Effort normal and breath sounds normal. She has no wheezes. She has no rales.  Abdominal: Soft. She exhibits no distension. There is no tenderness.  Musculoskeletal:  Back - no erythema or tenderness to palpation.  Neurological: She is alert and oriented to person, place, and time.  Skin: Skin is warm and dry.  Psychiatric:  Appears Anxious.    ED Course   Procedures (including critical care time)  Labs Reviewed   URINALYSIS, ROUTINE W REFLEX MICROSCOPIC   No results found. No diagnosis found.  MDM  57 year old female with DM-2, HTN, HLD, and CKD presents with back pain. - Pain is much improved currently. - Given reports of urinary frequency will obtain UA. - Once UA returns will discharge home with supportive care measures and PRN Tylenol.  Tommie Sams, DO 09/11/12 1134

## 2012-09-11 NOTE — ED Notes (Signed)
Per report pt has a CC of lower back pain described as sharp in nature.  Denies loss of bowel or bladder control.  Ambulatory without difficulty.

## 2012-09-11 NOTE — Telephone Encounter (Signed)
Pt called stating she was seen in ED today for back pain. She states they checked for for UTI and it was negative except elevated glucose. Back pain was rated 1/10 in ED Pt was instructed to take tylenol for pain.  Will see tomorrow for ED f/u, pt has questions about her back pain and medication.

## 2012-09-11 NOTE — ED Notes (Signed)
Patient resting. Advised we need a urine sample. She states she just went to the bathroom. Will hit call bell when she needs to use the bathroom again.

## 2012-09-12 ENCOUNTER — Encounter: Payer: Self-pay | Admitting: Internal Medicine

## 2012-09-12 ENCOUNTER — Ambulatory Visit: Payer: Self-pay | Admitting: Internal Medicine

## 2012-09-12 ENCOUNTER — Ambulatory Visit (HOSPITAL_COMMUNITY)
Admission: RE | Admit: 2012-09-12 | Discharge: 2012-09-12 | Disposition: A | Discharge status: Home / Self Care | Payer: Self-pay | Source: Ambulatory Visit | Attending: Internal Medicine | Admitting: Internal Medicine

## 2012-09-12 ENCOUNTER — Ambulatory Visit (INDEPENDENT_AMBULATORY_CARE_PROVIDER_SITE_OTHER): Payer: Self-pay | Admitting: Internal Medicine

## 2012-09-12 VITALS — BP 125/72 | HR 109 | Temp 97.6°F | Ht 63.5 in | Wt 159.9 lb

## 2012-09-12 DIAGNOSIS — I10 Essential (primary) hypertension: Secondary | ICD-10-CM

## 2012-09-12 DIAGNOSIS — M545 Low back pain: Secondary | ICD-10-CM

## 2012-09-12 DIAGNOSIS — E1129 Type 2 diabetes mellitus with other diabetic kidney complication: Secondary | ICD-10-CM

## 2012-09-12 DIAGNOSIS — M549 Dorsalgia, unspecified: Secondary | ICD-10-CM | POA: Insufficient documentation

## 2012-09-12 DIAGNOSIS — M51379 Other intervertebral disc degeneration, lumbosacral region without mention of lumbar back pain or lower extremity pain: Secondary | ICD-10-CM | POA: Insufficient documentation

## 2012-09-12 DIAGNOSIS — N058 Unspecified nephritic syndrome with other morphologic changes: Secondary | ICD-10-CM

## 2012-09-12 DIAGNOSIS — R198 Other specified symptoms and signs involving the digestive system and abdomen: Secondary | ICD-10-CM

## 2012-09-12 DIAGNOSIS — M5137 Other intervertebral disc degeneration, lumbosacral region: Secondary | ICD-10-CM | POA: Insufficient documentation

## 2012-09-12 LAB — GLUCOSE, CAPILLARY: Glucose-Capillary: 484 mg/dL — ABNORMAL HIGH (ref 70–99)

## 2012-09-12 MED ORDER — DOCUSATE SODIUM 100 MG PO CAPS: 100.0000 mg | ORAL_CAPSULE | Freq: Two times a day (BID) | ORAL | Status: DC

## 2012-09-12 NOTE — Patient Instructions (Addendum)
General Instructions: -Take Colace (docusate) to prevent straining with your bowel movements.  - You may have your Xrays done today.  -Take Tylenol as needed for pain. Try to not take more than 4 g (or 8 tablets of 500mg ) per day to prevent liver problems.  -Avoid taking Ibuprofen, Advil, or any medications called NSAIDs to prevent damage to your kidneys.  -It is very important that you apply for the [employer].  -Call Lynnae January, our social worker to help set up respite care for you.  -Follow up with [place] in one month or sooner for your back pain.     Treatment Goals:  Goals (1 Years of Data) as of 09/12/12         07/25/12 07/20/11     Result Component    . HEMOGLOBIN A1C < 7.0  13.1 11.4    . HEMOGLOBIN A1C < 7.0  13.1 11.4    . LDL CALC < 100       . LDL CALC < 100       . LDL CALC < 100         Progress Toward Treatment Goals:  Treatment Goal 09/12/2012  Hemoglobin A1C unable to assess  Blood pressure at goal    Self Care Goals & Plans:  Self Care Goal 09/12/2012  Manage my medications take my medicines as prescribed; bring my medications to every visit; refill my medications on time; follow the sick day instructions if I am sick  Monitor my health keep track of my blood pressure; check my feet daily  Eat healthy foods eat more vegetables; eat fruit for snacks and desserts; eat foods that are low in salt; eat baked foods instead of fried foods; eat smaller portions; drink diet soda or water instead of juice or soda  Be physically active take a walk every day; find an activity I enjoy  Meeting treatment goals maintain the current self-care plan    Home Blood Glucose Monitoring 09/12/2012  Check my blood sugar no home glucose monitoring  When to check my blood sugar -     Care Management & Community Referrals:  Referral 09/12/2012  Referrals made for care management support social worker

## 2012-09-12 NOTE — ED Provider Notes (Signed)
Medical screening examination/treatment/procedure(s) were conducted as a shared visit with non-physician practitioner(s) or resident  and myself.  I personally evaluated the patient during the encounter and agree with the findings and plan unless otherwise indicated.  Sharp back pain that resolved on arrival to ED.  Normal neuro exam.  No abd pain. Bedside US no hydronephrosis.  Pt feels similar to UTI in the past, UA unremarkable.  Fluids given.  Fup discussed.  Emergency Focused Ultrasound Exam: Limited abdomen of kidneys and bladder Indication: flank pain Focused abdominal ultrasound with kidneys imaged in transverse and longitudinal planes with bladder visualized in transverse plane. Interpretation: no hydronephrosis visualized.  no stones visualized  Images stored on the machine.   Enid Skeens, MD 09/12/12 [phone]

## 2012-09-13 ENCOUNTER — Telehealth: Payer: Self-pay | Admitting: *Deleted

## 2012-09-13 ENCOUNTER — Telehealth: Payer: Self-pay | Admitting: Internal Medicine

## 2012-09-13 DIAGNOSIS — G8929 Other chronic pain: Secondary | ICD-10-CM | POA: Insufficient documentation

## 2012-09-13 NOTE — Progress Notes (Signed)
  Subjective:    Patient ID: [name], female    DOB: 04-18-1955, 57 y.o.   MRN: [id number]  HPI [name] is a 57 year old woman with PMH of DM2, CKD stage 3 who presents for follow up visit from the ED for evaluation of low back pain that radiates to her abdomen. She denies recent trauma or injury, and states that the pain started suddenly. The pain is sharp at times but mostly and ache with radiation to her abdomen. The pain responds to Advil which was prescribed at the ED. She denies loss paresthesia of her LE, saddle anesthesia, bowel or bladder incontinence.    Review of Systems  Constitutional: Negative for fever, chills, diaphoresis, activity change, appetite change, fatigue and unexpected weight change.  Respiratory: Negative for cough and shortness of breath.   Cardiovascular: Negative for chest pain, palpitations and leg swelling.  Gastrointestinal: Positive for abdominal pain.  Genitourinary: Positive for difficulty urinating. Negative for dysuria, frequency, hematuria and flank pain.  Musculoskeletal: Positive for back pain.  Skin: Negative for color change, pallor, rash and wound.  Neurological: Negative for dizziness and weakness.  Hematological: Negative for adenopathy.  Psychiatric/Behavioral: Negative for suicidal ideas, behavioral problems and agitation. The patient is nervous/anxious.        Objective:   Physical Exam  Nursing note and vitals reviewed. Constitutional: She is oriented to person, place, and time. She appears well-developed and well-nourished. No distress.  Obese  HENT:  Head: Normocephalic and atraumatic.  Eyes: Conjunctivae are normal. No scleral icterus.  Cardiovascular: Normal rate and regular rhythm.   Pulmonary/Chest: Effort normal and breath sounds normal. No respiratory distress. She has no wheezes. She has no rales.  Abdominal: Soft. She exhibits no distension and no mass. There is no tenderness. There is no rebound and no guarding.   Musculoskeletal: She exhibits no edema.  Spine flexion and extension, negative straight leg raise.   Neurological: She is alert and oriented to person, place, and time.  Skin: Skin is warm and dry. No rash noted. She is not diaphoretic. No erythema. No pallor.  Psychiatric:  Anxious appearing.           Assessment & Plan:

## 2012-09-13 NOTE — Telephone Encounter (Signed)
Pt would like for you to call her w/ results of xrays at [phone]

## 2012-09-13 NOTE — Assessment & Plan Note (Signed)
BP Readings from Last 3 Encounters:  09/12/12 125/72  09/11/12 138/80  09/05/12 151/94    Lab Results  Component Value Date   NA 132* 07/25/2012   K 3.9 07/25/2012   CREATININE 1.48* 07/25/2012    Assessment: Blood pressure control: controlled Progress toward BP goal:  at goal Comments: She is on enalapril-HCTZ 5mg -12.5mg  daily  Plan: Medications:  continue current medications Educational resources provided: brochure;handout;video Self management tools provided:   Other plans: Continue monitoring.

## 2012-09-13 NOTE — Assessment & Plan Note (Signed)
No radiculopathy but pain radiates to her abdomen. No dysuria, no CVA tenderness, UA unremarkable for UTI.  Pt advised to avoid Advil, Motrin, NSAIDs, given her CKD stage 3.  Ordered lumbar spine Xrays which showed multilevel degenerative disc disease most significant at L1-L2 with focal kyphosis at this level but no progression since her Abdominal CT on 6/13.  Pt to obtain [facility] Card for further options for her back pain treatment including Sports Medicine referral.  Pt refused Vicodin.

## 2012-09-13 NOTE — Assessment & Plan Note (Addendum)
Lab Results  Component Value Date   HGBA1C 13.1 07/25/2012   HGBA1C 11.4* 07/20/2011   HGBA1C 11.7 04/28/2011     Assessment: Diabetes control: poor control (HgbA1C >9%) Progress toward A1C goal:  unable to assess Comments: She does not have her CBG meter with her during this visit, she denies hypoglycemia. She has not checked her CBG often due to financial hardship, being unable to afford her diabetic supplies.   Plan: Medications:  continue current medications Home glucose monitoring: Frequency: no home glucose monitoring Timing:   Instruction/counseling given: reminded to bring blood glucose meter & log to each visit, reminded to bring medications to each visit, discussed foot care and discussed diet Educational resources provided: brochure;handout Self management tools provided:   Other plans: Pt to meet with Rudell Cobb for [employer] application. Pt is due for diabetic eye exam but needs to apply to the [facility] Card first as she is unable to pay for this out of pocket.

## 2012-09-13 NOTE — Telephone Encounter (Signed)
Called pt to discuss her lumbar X ray results. She states that her pain is improving a little . I encouraged her again to apply for the [facility], she says she will try to do it next week. I explained to her that I would like to refer her to Sports Medicine once she has her [employer] and she agreed to follow up on this. She will call us back if her pain gets worse.

## 2012-09-14 NOTE — Progress Notes (Signed)
Case discussed with Dr. Kennerly soon after the resident saw the patient.  We reviewed the resident's history and exam and pertinent patient test results.  I agree with the assessment, diagnosis, and plan of care documented in the resident's note. 

## 2012-09-15 ENCOUNTER — Emergency Department (HOSPITAL_COMMUNITY)
Admission: EM | Admit: 2012-09-15 | Discharge: 2012-09-15 | Disposition: A | Discharge status: Home / Self Care | Payer: Self-pay | Attending: Emergency Medicine | Admitting: Emergency Medicine

## 2012-09-15 ENCOUNTER — Encounter (HOSPITAL_COMMUNITY): Payer: Self-pay | Admitting: Emergency Medicine

## 2012-09-15 DIAGNOSIS — M549 Dorsalgia, unspecified: Secondary | ICD-10-CM | POA: Insufficient documentation

## 2012-09-15 DIAGNOSIS — I129 Hypertensive chronic kidney disease with stage 1 through stage 4 chronic kidney disease, or unspecified chronic kidney disease: Secondary | ICD-10-CM | POA: Insufficient documentation

## 2012-09-15 DIAGNOSIS — X503XXA Overexertion from repetitive movements, initial encounter: Secondary | ICD-10-CM | POA: Insufficient documentation

## 2012-09-15 DIAGNOSIS — G8929 Other chronic pain: Secondary | ICD-10-CM | POA: Insufficient documentation

## 2012-09-15 DIAGNOSIS — E1169 Type 2 diabetes mellitus with other specified complication: Secondary | ICD-10-CM | POA: Insufficient documentation

## 2012-09-15 DIAGNOSIS — Z8742 Personal history of other diseases of the female genital tract: Secondary | ICD-10-CM | POA: Insufficient documentation

## 2012-09-15 DIAGNOSIS — N183 Chronic kidney disease, stage 3 unspecified: Secondary | ICD-10-CM | POA: Insufficient documentation

## 2012-09-15 DIAGNOSIS — R739 Hyperglycemia, unspecified: Secondary | ICD-10-CM

## 2012-09-15 DIAGNOSIS — Z7982 Long term (current) use of aspirin: Secondary | ICD-10-CM | POA: Insufficient documentation

## 2012-09-15 DIAGNOSIS — Y929 Unspecified place or not applicable: Secondary | ICD-10-CM | POA: Insufficient documentation

## 2012-09-15 DIAGNOSIS — Z79899 Other long term (current) drug therapy: Secondary | ICD-10-CM | POA: Insufficient documentation

## 2012-09-15 DIAGNOSIS — Y93F2 Activity, caregiving, lifting: Secondary | ICD-10-CM | POA: Insufficient documentation

## 2012-09-15 DIAGNOSIS — E785 Hyperlipidemia, unspecified: Secondary | ICD-10-CM | POA: Insufficient documentation

## 2012-09-15 DIAGNOSIS — IMO0002 Reserved for concepts with insufficient information to code with codable children: Secondary | ICD-10-CM | POA: Insufficient documentation

## 2012-09-15 DIAGNOSIS — F341 Dysthymic disorder: Secondary | ICD-10-CM | POA: Insufficient documentation

## 2012-09-15 DIAGNOSIS — R109 Unspecified abdominal pain: Secondary | ICD-10-CM | POA: Insufficient documentation

## 2012-09-15 LAB — CBC WITH DIFFERENTIAL/PLATELET
Basophils Relative: 0 % (ref 0–1)
Eosinophils Absolute: 0 10*3/uL (ref 0.0–0.7)
Eosinophils Relative: 0 % (ref 0–5)
Hemoglobin: 13.1 g/dL (ref 12.0–15.0)
Lymphs Abs: 1.7 10*3/uL (ref 0.7–4.0)
MCH: 30.2 pg (ref 26.0–34.0)
MCHC: 35.1 g/dL (ref 30.0–36.0)
MCV: 85.9 fL (ref 78.0–100.0)
Monocytes Absolute: 0.3 10*3/uL (ref 0.1–1.0)
Monocytes Relative: 3 % (ref 3–12)
Neutrophils Relative %: 73 % (ref 43–77)
RBC: 4.34 MIL/uL (ref 3.87–5.11)

## 2012-09-15 LAB — URINALYSIS, ROUTINE W REFLEX MICROSCOPIC
Bilirubin Urine: NEGATIVE
Glucose, UA: >1000 mg/dL — AB
Hgb urine dipstick: NEGATIVE
Ketones, ur: NEGATIVE mg/dL
Nitrite: NEGATIVE
Protein, ur: NEGATIVE mg/dL
Specific Gravity, Urine: 1.025 (ref 1.005–1.030)
Urobilinogen, UA: 0.2 mg/dL (ref 0.0–1.0)
pH: 5 (ref 5.0–8.0)

## 2012-09-15 LAB — POCT I-STAT, CHEM 8
Calcium, Ion: 1.16 mmol/L (ref 1.12–1.23)
Creatinine, Ser: 1.5 mg/dL — ABNORMAL HIGH (ref 0.50–1.10)
Glucose, Bld: 531 mg/dL — ABNORMAL HIGH (ref 70–99)
HCT: 41 % (ref 36.0–46.0)
Hemoglobin: 13.9 g/dL (ref 12.0–15.0)
TCO2: 27 mmol/L (ref 0–100)

## 2012-09-15 LAB — GLUCOSE, CAPILLARY: Glucose-Capillary: 451 mg/dL — ABNORMAL HIGH (ref 70–99)

## 2012-09-15 MED ORDER — INSULIN ASPART PROT & ASPART (70-30 MIX) 100 UNIT/ML ~~LOC~~ SUSP: 6.0000 [IU] | Freq: Once | SUBCUTANEOUS | Status: DC

## 2012-09-15 MED ORDER — KETOROLAC TROMETHAMINE 60 MG/2ML IM SOLN
60.0000 mg | Freq: Once | INTRAMUSCULAR | Status: AC
  Administered 2012-09-15: 60 mg via INTRAMUSCULAR
  Filled 2012-09-15: qty 2

## 2012-09-15 MED ORDER — HYDROCODONE-ACETAMINOPHEN 5-325 MG PO TABS: 1.0000 | ORAL_TABLET | Freq: Four times a day (QID) | ORAL | Status: DC | PRN

## 2012-09-15 MED ORDER — INSULIN ASPART PROT & ASPART (70-30 MIX) 100 UNIT/ML ~~LOC~~ SUSP
10.0000 [IU] | Freq: Once | SUBCUTANEOUS | Status: AC
  Administered 2012-09-15: 10 [IU] via SUBCUTANEOUS
  Filled 2012-09-15: qty 10

## 2012-09-15 NOTE — ED Notes (Signed)
Pt now stating that she has no abd pain and her back pain is none at this moment. States it hurts when she moves.

## 2012-09-15 NOTE — ED Provider Notes (Signed)
CSN: [id number]     Arrival date & time 09/15/12  1516 History     First MD Initiated Contact with Patient 09/15/12 1631     Chief Complaint  Patient presents with  . Back Pain  . Abdominal Pain   (Consider location/radiation/quality/duration/timing/severity/associated sxs/prior Treatment) HPI Comments: 57 y/o comes in with cc of abd pain, back pain. Pt has hx of niddm. Pt states that she was having lower quadrant abd pan, no uti like sx, no vaginal discharge or bleeding. The pain has now resolved. Pt states that she has hx of back pain, and that the back pain radiates to the abdomen. The pain is only worse with trying to lift.   [name] presenting with back pain and abdominal pain. The history is provided by the patient.  Back Pain Associated symptoms: abdominal pain   Associated symptoms: no chest pain, no dysuria and no headaches   Abdominal Pain Associated symptoms: no chest pain, no dysuria, no nausea, no shortness of breath and no vomiting     Past Medical History  Diagnosis Date  . Diabetes mellitus type II, uncontrolled   . Hypertension   . Hyperlipidemia   . Ovarian cyst, left   . Anemia     due to menorrhagia, BL 8-10  . Vaginal cyst     nabothian and bartholin  . CKD (chronic kidney disease) stage 3, GFR 30-59 ml/min     baseline creatinine 1.4-1.7  . Anxiety   . Depression   . Postmenopausal bleeding 06/12/2008  . Congenital heart defect     surgically corrected as a child   Past Surgical History  Procedure Laterality Date  . Cardiac surgery      to repair congenital defect as a child   Family History  Problem Relation Age of Onset  . Stroke Father   . Heart attack Father     Had MI in his 40s  . Stomach cancer Paternal Grandmother   . Diabetes Maternal Grandmother   . Cerebral palsy Daughter   . Anesthesia problems Neg Hx   . Hypotension Neg Hx   . Malignant hyperthermia Neg Hx   . Pseudochol deficiency Neg Hx    History   Substance Use Topics  . Smoking status: Never Smoker   . Smokeless tobacco: Former Neurosurgeon  . Alcohol Use: No   OB History   Grav Para Term Preterm Abortions TAB SAB Ect Mult Living   3 1 1  2  2   1      Review of Systems  Constitutional: Negative for activity change.  HENT: Negative for neck pain.   Respiratory: Negative for shortness of breath.   Cardiovascular: Negative for chest pain.  Gastrointestinal: Positive for abdominal pain. Negative for nausea and vomiting.  Genitourinary: Negative for dysuria.  Musculoskeletal: Positive for back pain.  Neurological: Negative for headaches.    Allergies  Review of patient's allergies indicates no known allergies.  Home Medications   Current Outpatient Rx  Name  Route  Sig  Dispense  Refill  . acetaminophen (TYLENOL) 500 MG tablet   Oral   Take 1,000 mg by mouth every 6 (six) hours as needed for pain.         Marland Kitchen aspirin 81 MG tablet   Oral   Take 81 mg by mouth daily.         Marland Kitchen docusate sodium (COLACE) 100 MG capsule   Oral   Take 100 mg by mouth  daily as needed for constipation.         . Enalapril-Hydrochlorothiazide 5-12.5 MG per tablet   Oral   Take 1 tablet by mouth daily.         Marland Kitchen glipiZIDE (GLUCOTROL) 10 MG tablet   Oral   Take 20 mg by mouth 2 (two) times daily.         . simvastatin (ZOCOR) 40 MG tablet   Oral   Take 40 mg by mouth every evening.          BP 116/56  Pulse 100  Temp(Src) 98.8 F (37.1 C) (Oral)  Resp 16  SpO2 99% Physical Exam  Nursing note and vitals reviewed. Constitutional: She is oriented to person, place, and time. She appears well-developed and well-nourished.  HENT:  Head: Normocephalic and atraumatic.  Eyes: EOM are normal. Pupils are equal, round, and reactive to light.  Neck: Neck supple.  Cardiovascular: Normal rate, regular rhythm and normal heart sounds.   No murmur heard. Pulmonary/Chest: Effort normal. No respiratory distress.  Abdominal: Soft. She  exhibits no distension. There is no tenderness. There is no rebound and no guarding.  Neurological: She is alert and oriented to person, place, and time.  Skin: Skin is warm and dry.    ED Course   Procedures (including critical care time)  Labs Reviewed  URINALYSIS, ROUTINE W REFLEX MICROSCOPIC - Abnormal; Notable for the following:    Glucose, UA >1000 (*)    Leukocytes, UA TRACE (*)    All other components within normal limits  GLUCOSE, CAPILLARY - Abnormal; Notable for the following:    Glucose-Capillary 494 (*)    All other components within normal limits  POCT I-STAT, CHEM 8 - Abnormal; Notable for the following:    Sodium 131 (*)    Chloride 95 (*)    BUN 38 (*)    Creatinine, Ser 1.50 (*)    Glucose, Bld 531 (*)    All other components within normal limits  CBC WITH DIFFERENTIAL  URINE MICROSCOPIC-ADD ON   No results found. No diagnosis found.  MDM  Pt comes in with abd pain, back pain. Pain started when she was trying to maneuver her daughter who is wheel chair bound. She is doing well now, and has no back pain or abd pain at my exam. Her CBG is elevated, but there is no gap, and the UA is clean. We will discharge her. No need for CT given normal exam.   Derwood Kaplan, MD 09/15/12 1858

## 2012-09-15 NOTE — ED Notes (Signed)
Pt states that she has back pain from herniated disc and also c/o lower abd pain. Pt states that she been taking tylenol but not helping. Pt denies n/v/d.

## 2012-09-15 NOTE — ED Notes (Addendum)
435 cbg- REPORTED TO Shyrl Numbers, MD.- pt may be d/c'd per MD.

## 2012-09-19 ENCOUNTER — Encounter (HOSPITAL_COMMUNITY): Payer: Self-pay | Admitting: Emergency Medicine

## 2012-09-19 ENCOUNTER — Emergency Department (HOSPITAL_COMMUNITY)
Admission: EM | Admit: 2012-09-19 | Discharge: 2012-09-19 | Disposition: A | Discharge status: Home / Self Care | Payer: Self-pay | Attending: Emergency Medicine | Admitting: Emergency Medicine

## 2012-09-19 ENCOUNTER — Emergency Department (HOSPITAL_COMMUNITY): Payer: Self-pay

## 2012-09-19 DIAGNOSIS — N183 Chronic kidney disease, stage 3 unspecified: Secondary | ICD-10-CM | POA: Insufficient documentation

## 2012-09-19 DIAGNOSIS — IMO0001 Reserved for inherently not codable concepts without codable children: Secondary | ICD-10-CM | POA: Insufficient documentation

## 2012-09-19 DIAGNOSIS — F411 Generalized anxiety disorder: Secondary | ICD-10-CM | POA: Insufficient documentation

## 2012-09-19 DIAGNOSIS — E785 Hyperlipidemia, unspecified: Secondary | ICD-10-CM | POA: Insufficient documentation

## 2012-09-19 DIAGNOSIS — Z79899 Other long term (current) drug therapy: Secondary | ICD-10-CM | POA: Insufficient documentation

## 2012-09-19 DIAGNOSIS — N390 Urinary tract infection, site not specified: Secondary | ICD-10-CM | POA: Insufficient documentation

## 2012-09-19 DIAGNOSIS — I129 Hypertensive chronic kidney disease with stage 1 through stage 4 chronic kidney disease, or unspecified chronic kidney disease: Secondary | ICD-10-CM | POA: Insufficient documentation

## 2012-09-19 DIAGNOSIS — Z7982 Long term (current) use of aspirin: Secondary | ICD-10-CM | POA: Insufficient documentation

## 2012-09-19 DIAGNOSIS — F329 Major depressive disorder, single episode, unspecified: Secondary | ICD-10-CM | POA: Insufficient documentation

## 2012-09-19 DIAGNOSIS — R109 Unspecified abdominal pain: Secondary | ICD-10-CM | POA: Insufficient documentation

## 2012-09-19 DIAGNOSIS — Z8742 Personal history of other diseases of the female genital tract: Secondary | ICD-10-CM | POA: Insufficient documentation

## 2012-09-19 DIAGNOSIS — Z8774 Personal history of (corrected) congenital malformations of heart and circulatory system: Secondary | ICD-10-CM | POA: Insufficient documentation

## 2012-09-19 DIAGNOSIS — Z862 Personal history of diseases of the blood and blood-forming organs and certain disorders involving the immune mechanism: Secondary | ICD-10-CM | POA: Insufficient documentation

## 2012-09-19 DIAGNOSIS — K5900 Constipation, unspecified: Secondary | ICD-10-CM | POA: Insufficient documentation

## 2012-09-19 DIAGNOSIS — F3289 Other specified depressive episodes: Secondary | ICD-10-CM | POA: Insufficient documentation

## 2012-09-19 LAB — URINALYSIS, ROUTINE W REFLEX MICROSCOPIC
Bilirubin Urine: NEGATIVE
Ketones, ur: 15 mg/dL — AB
Nitrite: NEGATIVE
pH: 5 (ref 5.0–8.0)

## 2012-09-19 LAB — COMPREHENSIVE METABOLIC PANEL
AST: 21 U/L (ref 0–37)
Albumin: 4.5 g/dL (ref 3.5–5.2)
Alkaline Phosphatase: 112 U/L (ref 39–117)
BUN: 33 mg/dL — ABNORMAL HIGH (ref 6–23)
CO2: 29 mEq/L (ref 19–32)
Chloride: 84 mEq/L — ABNORMAL LOW (ref 96–112)
GFR calc non Af Amer: 44 mL/min — ABNORMAL LOW (ref >90)
Potassium: 5.2 mEq/L — ABNORMAL HIGH (ref 3.5–5.1)
Total Bilirubin: 0.7 mg/dL (ref 0.3–1.2)

## 2012-09-19 LAB — GLUCOSE, CAPILLARY

## 2012-09-19 LAB — CBC WITH DIFFERENTIAL/PLATELET
Basophils Absolute: 0 10*3/uL (ref 0.0–0.1)
Basophils Relative: 0 % (ref 0–1)
HCT: 37 % (ref 36.0–46.0)
Hemoglobin: 13.6 g/dL (ref 12.0–15.0)
Lymphocytes Relative: 17 % (ref 12–46)
MCHC: 36.8 g/dL — ABNORMAL HIGH (ref 30.0–36.0)
Monocytes Relative: 4 % (ref 3–12)
Neutro Abs: 7.7 10*3/uL (ref 1.7–7.7)
Neutrophils Relative %: 79 % — ABNORMAL HIGH (ref 43–77)
RBC: 4.38 MIL/uL (ref 3.87–5.11)
WBC: 9.7 10*3/uL (ref 4.0–10.5)

## 2012-09-19 LAB — URINE MICROSCOPIC-ADD ON

## 2012-09-19 LAB — OCCULT BLOOD, POC DEVICE: Fecal Occult Bld: NEGATIVE

## 2012-09-19 MED ORDER — SODIUM CHLORIDE 0.9 % IV BOLUS (SEPSIS)
1000.0000 mL | INTRAVENOUS | Status: AC
  Administered 2012-09-19: 1000 mL via INTRAVENOUS

## 2012-09-19 MED ORDER — IOHEXOL 300 MG/ML  SOLN
100.0000 mL | Freq: Once | INTRAMUSCULAR | Status: AC | PRN
  Administered 2012-09-19: 100 mL via INTRAVENOUS

## 2012-09-19 MED ORDER — CEPHALEXIN 500 MG PO CAPS: 1000.0000 mg | ORAL_CAPSULE | Freq: Two times a day (BID) | ORAL | Status: DC

## 2012-09-19 MED ORDER — IOHEXOL 300 MG/ML  SOLN
25.0000 mL | INTRAMUSCULAR | Status: AC
  Administered 2012-09-19 (×2): 25 mL via ORAL

## 2012-09-19 MED ORDER — INSULIN ASPART 100 UNIT/ML ~~LOC~~ SOLN
3.0000 [IU] | Freq: Once | SUBCUTANEOUS | Status: AC
  Administered 2012-09-19: 3 [IU] via SUBCUTANEOUS
  Filled 2012-09-19: qty 1

## 2012-09-19 NOTE — ED Notes (Addendum)
abd pain x   Off and on for  A while was seen on the 9th  For her back no n/v denies dysuria pain comes and goes and has no pain now states that she took a lax and that has worked also and her back hurts also

## 2012-09-19 NOTE — ED Provider Notes (Signed)
CSN: [id number]     Arrival date & time 09/19/12  1623 History     First MD Initiated Contact with Patient 09/19/12 1859     Chief Complaint  Patient presents with  . Abdominal Pain   (Consider location/radiation/quality/duration/timing/severity/associated sxs/prior Treatment) [name] presenting with abdominal pain. The history is provided by the patient.  Abdominal Pain Pain location:  Suprapubic Pain quality comment:  Warmth Pain radiates to:  Does not radiate Pain severity:  Moderate Onset quality:  Gradual Duration: months. Timing:  Intermittent Progression:  Unchanged Chronicity:  Chronic Relieved by:  Nothing Worsened by:  Nothing tried Ineffective treatments:  None tried Associated symptoms: constipation   Associated symptoms: no chest pain, no cough, no diarrhea, no dysuria, no fatigue, no fever, no hematuria, no nausea, no shortness of breath, no vaginal bleeding, no vaginal discharge and no vomiting     Past Medical History  Diagnosis Date  . Diabetes mellitus type II, uncontrolled   . Hypertension   . Hyperlipidemia   . Ovarian cyst, left   . Anemia     due to menorrhagia, BL 8-10  . Vaginal cyst     nabothian and bartholin  . CKD (chronic kidney disease) stage 3, GFR 30-59 ml/min     baseline creatinine 1.4-1.7  . Anxiety   . Depression   . Postmenopausal bleeding 06/12/2008  . Congenital heart defect     surgically corrected as a child   Past Surgical History  Procedure Laterality Date  . Cardiac surgery      to repair congenital defect as a child   Family History  Problem Relation Age of Onset  . Stroke Father   . Heart attack Father     Had MI in his 30s  . Stomach cancer Paternal Grandmother   . Diabetes Maternal Grandmother   . Cerebral palsy Daughter   . Anesthesia problems Neg Hx   . Hypotension Neg Hx   . Malignant hyperthermia Neg Hx   . Pseudochol deficiency Neg Hx    History  Substance Use Topics  . Smoking  status: Never Smoker   . Smokeless tobacco: Former Neurosurgeon  . Alcohol Use: No   OB History   Grav Para Term Preterm Abortions TAB SAB Ect Mult Living   3 1 1  2  2   1      Review of Systems  Constitutional: Negative for fever and fatigue.  HENT: Negative for congestion, drooling and neck pain.   Eyes: Negative for pain.  Respiratory: Negative for cough and shortness of breath.   Cardiovascular: Negative for chest pain.  Gastrointestinal: Positive for abdominal pain and constipation. Negative for nausea, vomiting and diarrhea.  Genitourinary: Positive for frequency. Negative for dysuria, hematuria, vaginal bleeding, vaginal discharge, vaginal pain, menstrual problem and pelvic pain.       Saw a streak of blood in her stool once recently.   Musculoskeletal: Negative for back pain and gait problem.  Skin: Negative for color change.  Neurological: Negative for dizziness and headaches.  Hematological: Negative for adenopathy.  Psychiatric/Behavioral: Negative for behavioral problems.  All other systems reviewed and are negative.    Allergies  Review of patient's allergies indicates no known allergies.  Home Medications   Current Outpatient Rx  Name  Route  Sig  Dispense  Refill  . acetaminophen (TYLENOL) 500 MG tablet   Oral   Take 1,000 mg by mouth every 6 (six) hours as needed for pain.         Marland Kitchen  aspirin 81 MG tablet   Oral   Take 81 mg by mouth daily.         . bisacodyl (DULCOLAX) 5 MG EC tablet   Oral   Take 5 mg by mouth daily as needed for constipation.         . Enalapril-Hydrochlorothiazide 5-12.5 MG per tablet   Oral   Take 1 tablet by mouth daily.         Marland Kitchen glipiZIDE (GLUCOTROL) 10 MG tablet   Oral   Take 20 mg by mouth 2 (two) times daily.         . simvastatin (ZOCOR) 40 MG tablet   Oral   Take 40 mg by mouth every evening.         . traMADol (ULTRAM) 50 MG tablet   Oral   Take 50 mg by mouth every 6 (six) hours as needed for pain.           BP 130/63  Pulse 100  Temp(Src) 98.2 F (36.8 C)  Resp 16  SpO2 95% Physical Exam  Nursing note and vitals reviewed. Constitutional: She is oriented to person, place, and time. She appears well-developed and well-nourished.  HENT:  Head: Normocephalic.  Mouth/Throat: No oropharyngeal exudate.  Eyes: Conjunctivae and EOM are normal. Pupils are equal, round, and reactive to light.  Neck: Normal range of motion. Neck supple.  Cardiovascular: Normal rate, regular rhythm, normal heart sounds and intact distal pulses.  Exam reveals no gallop and no friction rub.   No murmur heard. Pulmonary/Chest: Effort normal and breath sounds normal. No respiratory distress. She has no wheezes.  Abdominal: Soft. Bowel sounds are normal. There is no tenderness. There is no rebound and no guarding.  Genitourinary:  Normal rectal exam. hemeoccult neg.   Musculoskeletal: Normal range of motion. She exhibits no edema and no tenderness.  Neurological: She is alert and oriented to person, place, and time.  Skin: Skin is warm and dry.  Psychiatric: She has a normal mood and affect. Her behavior is normal.    ED Course   Procedures (including critical care time)  Labs Reviewed  GLUCOSE, CAPILLARY - Abnormal; Notable for the following:    Glucose-Capillary 334 (*)    All other components within normal limits  CBC WITH DIFFERENTIAL - Abnormal; Notable for the following:    MCHC 36.8 (*)    Neutrophils Relative % 79 (*)    All other components within normal limits  COMPREHENSIVE METABOLIC PANEL - Abnormal; Notable for the following:    Sodium 125 (*)    Potassium 5.2 (*)    Chloride 84 (*)    Glucose, Bld 355 (*)    BUN 33 (*)    Creatinine, Ser 1.34 (*)    GFR calc non Af Amer 44 (*)    GFR calc Af Amer 51 (*)    All other components within normal limits  URINALYSIS, ROUTINE W REFLEX MICROSCOPIC - Abnormal; Notable for the following:    Glucose, UA 500 (*)    Ketones, ur 15 (*)     Leukocytes, UA SMALL (*)    All other components within normal limits  URINE MICROSCOPIC-ADD ON - Abnormal; Notable for the following:    Squamous Epithelial / LPF FEW (*)    Bacteria, UA FEW (*)    All other components within normal limits  GLUCOSE, CAPILLARY - Abnormal; Notable for the following:    Glucose-Capillary 221 (*)    All other components within normal  limits  URINE CULTURE  LIPASE, BLOOD  OCCULT BLOOD, POC DEVICE   Ct Abdomen Pelvis W Contrast  09/19/2012   *RADIOLOGY REPORT*  Clinical Data: Intermittent lower abdominal pain for the past several days.  CT ABDOMEN AND PELVIS WITH CONTRAST  Technique:  Multidetector CT imaging of the abdomen and pelvis was performed following the standard protocol during bolus administration of intravenous contrast.  Contrast: OMNIPAQUE IOHEXOL 300 MG/ML  SOLN  Comparison: CT of the abdomen and pelvis 07/19/2011.  Findings:  Lung Bases: Unremarkable.  Abdomen/Pelvis:  The appearance of the liver, gallbladder, spleen and bilateral adrenal glands is unremarkable.  The body and tail of the pancreas are atrophic.  Head and uncinate process of the pancreas are unremarkable in appearance.  There are numerous renal lesions bilaterally, the majority of which are too small to definitively characterize.  Several of the larger renal lesions are indeterminate, but favored to represent proteinaceous cysts, with the largest lesion measuring up to 1.2 cm in diameter in the anterior aspect of the lower pole of the left kidney.  Potential calcification in the posterior wall of a multiseptated cyst in the interpolar region of the right kidney is noted on image 27 of series 2.  Normal appendix.  No significant volume of ascites.  No pneumoperitoneum.  No pathologic distension of small bowel.  No definite pathologic lymphadenopathy identified within the abdomen or pelvis.  Uterus and ovaries are unremarkable in appearance.  Musculoskeletal: There are no aggressive  appearing lytic or blastic lesions noted in the visualized portions of the skeleton.  IMPRESSION: 1.  No acute findings in the abdomen or pelvis to account for the patient's symptoms. 2.  Normal appendix. 3.  Multiple complex renal lesions bilaterally, several which are indeterminate, including a partially calcified multiseptated cyst in the interpolar region of the right kidney.  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  Further evaluation with nonemergent MRI of the abdomen with and without IV gadolinium is recommended to reevaluate these lesions. 4.  Atrophy of the body and tail of the pancreas. 5.  Additional incidental findings, as above.   Original Report Authenticated By: Trudie Reed, M.D.   1. Abdominal pain   2. UTI (lower urinary tract infection)     MDM  7:22 PM 57 y.o. female here w/ abd/back pain. Seen twice recently. Pt denies pain now on exam. Will get CT, labs, IVF for hyperglycemia.   11:17 PM: Pt continues to appear well, abdomen remains benign. Ct neg, very mild elev in potassium and hyponatremia likely related to elevated BS. Pt has gotten 2L IVF here. Small leuks in UA, will tx for UTI as pt has suprapubic pain.  I have discussed the diagnosis/risks/treatment options with the patient and believe the pt to be eligible for discharge home to follow-up with pcp in 1-2 days for eval and recheck of labs. Also notified pt that she has lesions on her kidneys that need non-emerg eval w/ MRI, this is not a new finding. We also discussed returning to the ED immediately if new or worsening sx occur. We discussed the sx which are most concerning (e.g., worsening abd pain, fever) that necessitate immediate return. Any new prescriptions provided to the patient are listed below.  Discharge Medication List as of 09/19/2012 11:19 PM    START taking these medications   Details  cephALEXin  (KEFLEX) 500 MG capsule Take 2 capsules (1,000  mg total) by mouth 2 (two) times daily., Starting 09/19/2012, Until Discontinued, Print         Junius Argyle, MD 09/20/12 1030

## 2012-09-19 NOTE — ED Notes (Signed)
Pt sts pain is coming back and requesting to take home med tramadol 50mg  PO. Dr. Romeo Apple made aware sts it is okay. Pt. Made aware. Unhooked for CT

## 2012-09-21 LAB — URINE CULTURE

## 2012-09-22 ENCOUNTER — Telehealth (HOSPITAL_COMMUNITY): Payer: Self-pay | Admitting: Emergency Medicine

## 2012-09-22 NOTE — ED Notes (Signed)
Post ED Visit - Positive Culture Follow-up  Culture report reviewed by antimicrobial stewardship pharmacist: []  Wes Dulaney, Pharm.D., BCPS []  Celedonio Miyamoto, Pharm.D., BCPS []  Georgina Pillion, [address].D., BCPS []  [place], [place].D., BCPS, AAHIVP []  Estella Husk, Pharm.D., BCPS, AAHIVP [x]  Alla German, Pharm.D.  Positive urine culture Treated with Keflex, organism sensitive to the same and no further patient follow-up is required at this time.  Kylie A Holland 09/22/2012, 2:57 PM

## 2012-09-25 ENCOUNTER — Ambulatory Visit: Payer: Self-pay | Admitting: Internal Medicine

## 2012-09-25 ENCOUNTER — Other Ambulatory Visit: Payer: Self-pay | Admitting: *Deleted

## 2012-09-25 NOTE — Telephone Encounter (Signed)
Tramadol not on medication list when seen by Dr. Garald Braver in early August.  Added as a historical medication by ED.  Will not refill until seen by PCP next week.  She can decide if prescription for tramadol is appropriate, and if so, initiate a pain contract.

## 2012-09-26 NOTE — Telephone Encounter (Signed)
Pt states "I don't want it anymore".  Informed pt to keep appt next week and to discuss it w/her doctor if she changes her mind. She states "ok".

## 2012-09-29 ENCOUNTER — Other Ambulatory Visit: Payer: Self-pay | Admitting: Internal Medicine

## 2012-10-02 ENCOUNTER — Ambulatory Visit (INDEPENDENT_AMBULATORY_CARE_PROVIDER_SITE_OTHER): Payer: Self-pay | Admitting: Internal Medicine

## 2012-10-02 ENCOUNTER — Encounter: Payer: Self-pay | Admitting: Internal Medicine

## 2012-10-02 ENCOUNTER — Other Ambulatory Visit: Payer: Self-pay | Admitting: Internal Medicine

## 2012-10-02 VITALS — BP 132/78 | HR 98 | Temp 97.6°F | Ht 63.0 in | Wt 159.1 lb

## 2012-10-02 DIAGNOSIS — E785 Hyperlipidemia, unspecified: Secondary | ICD-10-CM

## 2012-10-02 DIAGNOSIS — I10 Essential (primary) hypertension: Secondary | ICD-10-CM

## 2012-10-02 DIAGNOSIS — Z23 Encounter for immunization: Secondary | ICD-10-CM

## 2012-10-02 DIAGNOSIS — E119 Type 2 diabetes mellitus without complications: Secondary | ICD-10-CM

## 2012-10-02 MED ORDER — PIOGLITAZONE HCL 15 MG PO TABS: 15.0000 mg | ORAL_TABLET | Freq: Every day | ORAL | Status: DC

## 2012-10-02 MED ORDER — GLIPIZIDE 10 MG PO TABS: 20.0000 mg | ORAL_TABLET | Freq: Two times a day (BID) | ORAL | Status: DC

## 2012-10-02 NOTE — Progress Notes (Signed)
Patient ID: Panda Crossin, female   DOB: 1955-12-11, 57 y.o.   MRN: [id number]    Subjective:   Patient ID: Makenze Ellett female   DOB: [birth year] 57 y.o.   MRN: [id number]  HPI: Ms.Shaneice Lovins is a 57 y.o. with PMH of DM, hyperlipidemia, HTN, CKD- stage 3, presents for follow up visist today with no other medical complaints.   Diabetes- pt says she is compliant with her medication- Glipizide- 20mg  BID. Last HBA1C- 07/25/2012- 13.1. Has been elevated in the past year- >11. CBG today- 407. No hypoglycemic symptoms. Patient didn't bring her glucometer today. No refills needed today.  HTN- Blood pressure today- 132/78. Last two readings- 09/12/2012- 125/72, 09/11/2012- 138/80. Patient is on enalapril- HCT( 5-12.5 daily). Patient say she is compliant with her BP meds.  Hyperlipidemia- Patient is on simvastatin 40mg  daily. Says she is compliant. Last lipid profile done- 07/25/2012- LDL- 79, has been <110 in the past year. TG has been markedly elevated chronically - 477- 07/25/2012.   CKD- stage 3- Cr- 1.34- 09/19/2012. Pt's Cr has been chronically elevated. Abd Ct done 09/19/2012 showed There are numerous renal lesions bilaterally. Pt is not aware of any family hx of renal disease.   Preventive health Screening- Patient said she had Dm foot and eye exam- done 3 weeks ago. But last Eye exam on file- 2012. But Patient is due for Colonoscopy- Never had it done, Pap smear,and mammogram.   Past Medical History  Diagnosis Date  . Diabetes mellitus type II, uncontrolled   . Hypertension   . Hyperlipidemia   . Ovarian cyst, left   . Anemia     due to menorrhagia, BL 8-10  . Vaginal cyst     nabothian and bartholin  . CKD (chronic kidney disease) stage 3, GFR 30-59 ml/min     baseline creatinine 1.4-1.7  . Anxiety   . Depression   . Postmenopausal bleeding 06/12/2008  . Congenital heart defect     surgically corrected as a child   Current Outpatient Prescriptions  Medication Sig Dispense Refill  .  acetaminophen (TYLENOL) 500 MG tablet Take 1,000 mg by mouth every 6 (six) hours as needed for pain.      Marland Kitchen aspirin 81 MG tablet Take 81 mg by mouth daily.      . bisacodyl (DULCOLAX) 5 MG EC tablet Take 5 mg by mouth daily as needed for constipation.      . cephALEXin (KEFLEX) 500 MG capsule Take 2 capsules (1,000 mg total) by mouth 2 (two) times daily.  40 capsule  0  . Enalapril-Hydrochlorothiazide 5-12.5 MG per tablet Take 1 tablet by mouth daily.      Marland Kitchen glipiZIDE (GLUCOTROL) 10 MG tablet Take 20 mg by mouth 2 (two) times daily.      . pioglitazone (ACTOS) 15 MG tablet Take 1 tablet (15 mg total) by mouth daily.  30 tablet  0  . simvastatin (ZOCOR) 40 MG tablet Take 40 mg by mouth every evening.      . traMADol (ULTRAM) 50 MG tablet Take 50 mg by mouth every 6 (six) hours as needed for pain.      . [DISCONTINUED] pantoprazole (PROTONIX) 20 MG tablet Take 2 tablets (40 mg total) by mouth daily.  30 tablet  1  . [DISCONTINUED] sertraline (ZOLOFT) 100 MG tablet Take 1 tablet (100 mg total) by mouth daily.  30 tablet  2   No current facility-administered medications for this visit.   Family History  Problem Relation Age  of Onset  . Stroke Father   . Heart attack Father     Had MI in his 21s  . Stomach cancer Paternal Grandmother   . Diabetes Maternal Grandmother   . Cerebral palsy Daughter   . Anesthesia problems Neg Hx   . Hypotension Neg Hx   . Malignant hyperthermia Neg Hx   . Pseudochol deficiency Neg Hx    History   Social History  . Marital Status: Divorced    Spouse Name: N/A    Number of Children: 1  . Years of Education: 12th grade   Occupational History  . 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 Smoking status: Never Smoker   . Smokeless tobacco: Former Neurosurgeon  . Alcohol Use: No  . Drug Use: No  . Sexual Activity: None   Other Topics Concern  . 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    Review of Systems: CONSTITUTIONAL- No Fever, weightloss, night sweat,or change in appetite. SKIN- No Rash, colour changes or itching. HEAD- No Headache or dizziness. RESPIRATORY-No  Cough or SOB. CARDIAC- No Palpitations, DOE, PND or chest pain. GI- No Dysphagia, nausea, vomiting No diarrhoea, constipation, abd pain. URINARY- No Frequency, or dysuria. NEUROLOGIC- No Numbness or syncope, no burning in her legs. PYSCH- No complaints of sadness, crying spells or, anxiety.  Objective:  Physical Exam: Filed Vitals:   10/02/12 1505  BP: 132/78  Pulse: 98  Temp: 97.6 F (36.4 C)  TempSrc: Oral  Height: 5\' 3"  (1.6 m)  Weight: 159 lb 1.6 oz (72.167 kg)  SpO2: 99%   GENERAL- alert, complaining about numerous stressors in her life presently- her daughter, her mother, and trying to get disabilty, surving on one income- her daughter's disabilty . Appears as stated age, not in any distress. HEENT- Atraumatic, normocephalic, PERRL, EOMI, oral mucosa appears moist. No cervical LN enlargement, thyroid does not appear enlarged. CARDIAC- RRR, no murmurs, rubs or gallops. RESP- Moving equal volumes of air, and clear to auscultation bilaterally. ABDOMEN- Soft,non tender, no palpable masses or organomegaly, bowel sounds present. BACK- Normal curvature of the spine, No tenderness along the vertebrae, no CVA tenderness. NEURO- Cr N 2-12 intact, strenght equal and present in all extremities. EXTREMITIES- pulse 2+, symmetric. SKIN- Warm, dry, No rash or lesion. PSYCH- Normal mood and flat affect, appropriate thought content and speech.  Assessment & Plan:  Uncontrolled Dm- Considering pt last HBA1c of 13.1 done 07/25/2012, and elevated in the past year, Discussed starting patient on insulin today. Patient strongly objects to insulin treatment, says she can not afford it. Patient is already on 20mg  BID dose of glipizide. Prescription for Pioglitazone- given to patient. To start pioglitazone- 15mg  daily,  30 tablets, no refills. Coupon given to help with the cost of the medication. Patient strongly encouraged to come back in 4 weeks for recheck of HBA1c, and follow up.   Hypertension- Continue current therapy- Enalapril- HCT- 5-12.5mg  daily. Currently controlled. Last BMP done 1 week ago showed electrolyte abnormality. For BMP recheck today.   Hyperlipidemia- Patient currently on simvastatin 40mg , consider adding Gemfibrozil to control TG levels. Patient strongly opposed to adding new medication that will cost money. Consider introducing Gemfibrozil to patients medication regimen on next visit.  Preventive health maintenance- Flu shot given today. Patient has no insurance, and said she lost all the papers for her orange card application. Patient encouraged to re-start the application, so she  can get all her health screening done.

## 2012-10-02 NOTE — Patient Instructions (Signed)
We will be prescibing a new medication for yop and giving you a copupon to help with the cost. Please take one tablet once a day. We will like to see you here in 1 month.

## 2012-10-03 NOTE — Telephone Encounter (Signed)
Pt has appt

## 2012-10-04 NOTE — Progress Notes (Signed)
I saw and evaluated the patient.  I personally confirmed the key portions of the history and exam documented by Dr. Emokpae and I reviewed pertinent patient test results.  The assessment, diagnosis, and plan were formulated together and I agree with the documentation in the resident's note. 

## 2012-10-07 ENCOUNTER — Other Ambulatory Visit: Payer: Self-pay | Admitting: Internal Medicine

## 2012-10-09 ENCOUNTER — Emergency Department (INDEPENDENT_AMBULATORY_CARE_PROVIDER_SITE_OTHER)
Admission: EM | Admit: 2012-10-09 | Discharge: 2012-10-09 | Disposition: A | Discharge status: Home / Self Care | Payer: Self-pay | Source: Home / Self Care

## 2012-10-09 ENCOUNTER — Encounter (HOSPITAL_COMMUNITY): Payer: Self-pay | Admitting: Emergency Medicine

## 2012-10-09 ENCOUNTER — Ambulatory Visit: Payer: Self-pay | Admitting: Internal Medicine

## 2012-10-09 DIAGNOSIS — K589 Irritable bowel syndrome without diarrhea: Secondary | ICD-10-CM

## 2012-10-09 HISTORY — DX: Urinary tract infection, site not specified: N39.0

## 2012-10-09 LAB — POCT URINALYSIS DIP (DEVICE)
Bilirubin Urine: NEGATIVE
Hgb urine dipstick: NEGATIVE
Ketones, ur: NEGATIVE mg/dL
Nitrite: NEGATIVE
Specific Gravity, Urine: 1.01 (ref 1.005–1.030)
pH: 6.5 (ref 5.0–8.0)

## 2012-10-09 MED ORDER — HYOSCYAMINE SULFATE 0.125 MG SL SUBL: 0.1250 mg | SUBLINGUAL_TABLET | SUBLINGUAL | Status: DC | PRN

## 2012-10-09 NOTE — ED Notes (Signed)
C/o abdominal discomfort " for a while" vague with details.  Touches all around abdomen as area of discomfort and describes discomfort as "warm " feeling.  No nausea, no vomiting.  Last bm this am, normal per patient.

## 2012-10-09 NOTE — ED Notes (Signed)
Patient instructed to put on gown for physician examination

## 2012-10-09 NOTE — ED Provider Notes (Signed)
CSN: [id number]     Arrival date & time 10/09/12  1554 History   First MD Initiated Contact with Patient 10/09/12 1705     Chief Complaint  Patient presents with  . Abdominal Pain   (Consider location/radiation/quality/duration/timing/severity/associated sxs/prior Treatment) HPI Comments: 57 year old female with the complaint of abdominal discomfort described it as a warm feeling of a known 4 over a year but less than 2 years. She has a history of diabetes type 2 uncontrolled, anxiety and depression, hypertension, dyslipidemia, CKD III and chronic intermittent abdominal pain. She was also told she had a history of IBS. She is complaining of a warm sensation across her lower abdomen off and on for an unknown period of time. She was unable to narrow down a period of time states she has "BS" every once in a while for a long time.  [name] presenting with abdominal pain.  Abdominal Pain Associated symptoms include abdominal pain. Pertinent negatives include no headaches.    Past Medical History  Diagnosis Date  . Diabetes mellitus type II, uncontrolled   . Hypertension   . Hyperlipidemia   . Ovarian cyst, left   . Anemia     due to menorrhagia, BL 8-10  . Vaginal cyst     nabothian and bartholin  . CKD (chronic kidney disease) stage 3, GFR 30-59 ml/min     baseline creatinine 1.4-1.7  . Anxiety   . Depression   . Postmenopausal bleeding 06/12/2008  . 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 UTI (lower urinary tract infection)    Past Surgical History  Procedure Laterality Date  . Cardiac surgery      to repair congenital defect as a child   Family History  Problem Relation Age of Onset  . Stroke Father   . Heart attack Father     Had MI in his 55s  . Stomach cancer Paternal Grandmother   . Diabetes Maternal Grandmother   . Cerebral palsy Daughter   . Anesthesia problems Neg Hx   . Hypotension Neg Hx   . Malignant hyperthermia Neg Hx    . Pseudochol deficiency Neg Hx    History  Substance Use Topics  . Smoking status: Never Smoker   . Smokeless tobacco: Former Neurosurgeon  . Alcohol Use: No   OB History   Grav Para Term Preterm Abortions TAB SAB Ect Mult Living   3 1 1  2  2   1      Review of Systems  Constitutional: Negative for fever and activity change.  Respiratory: Negative.   Cardiovascular: Negative.   Gastrointestinal: Positive for abdominal pain. Negative for nausea, vomiting, diarrhea, constipation and blood in stool.  Genitourinary: Positive for frequency. Negative for dysuria, hematuria, flank pain, decreased urine volume, vaginal discharge, vaginal pain and pelvic pain.  Skin: Negative.   Neurological: Negative for tremors, speech difficulty and headaches.  Psychiatric/Behavioral: The patient is nervous/anxious.     Allergies  Review of patient's allergies indicates no known allergies.  Home Medications   Current Outpatient Rx  Name  Route  Sig  Dispense  Refill  . acetaminophen (TYLENOL) 500 MG tablet   Oral   Take 1,000 mg by mouth every 6 (six) hours as needed for pain.         Marland Kitchen aspirin 81 MG tablet   Oral   Take 81 mg by mouth daily.         Marland Kitchen  bisacodyl (DULCOLAX) 5 MG EC tablet   Oral   Take 5 mg by mouth daily as needed for constipation.         . cephALEXin (KEFLEX) 500 MG capsule   Oral   Take 2 capsules (1,000 mg total) by mouth 2 (two) times daily.   40 capsule   0   . Enalapril-Hydrochlorothiazide 5-12.5 MG per tablet   Oral   Take 1 tablet by mouth daily.         Marland Kitchen glipiZIDE (GLUCOTROL) 10 MG tablet   Oral   Take 2 tablets (20 mg total) by mouth 2 (two) times daily.   120 tablet   1   . pioglitazone (ACTOS) 15 MG tablet   Oral   Take 1 tablet (15 mg total) by mouth daily.   30 tablet   0   . simvastatin (ZOCOR) 40 MG tablet   Oral   Take 40 mg by mouth every evening.         . traMADol (ULTRAM) 50 MG tablet   Oral   Take 50 mg by mouth every 6  (six) hours as needed for pain.          BP 141/90  Pulse 102  Temp(Src) 97.5 F (36.4 C) (Oral)  Resp 20  Ht 5\' 4"  (1.626 m)  Wt 159 lb (72.122 kg)  BMI 27.28 kg/m2  SpO2 96% Physical Exam  Nursing note and vitals reviewed. Constitutional: She appears well-developed and well-nourished. No distress.  Neck: Normal range of motion.  Cardiovascular: Normal rate, regular rhythm and normal heart sounds.   Pulmonary/Chest: Effort normal and breath sounds normal. No respiratory distress.  Abdominal: Soft. Bowel sounds are normal. She exhibits no distension and no mass. There is no tenderness. There is no rebound and no guarding.  No abnormal findings of the abdomen. No tenderness.  Neurological: She is alert. She exhibits normal muscle tone.  Skin: Skin is warm and dry.  Psychiatric: Her mood appears anxious. Her speech is rapid and/or pressured. She is agitated.    ED Course  Procedures (including critical care time) Labs Review Labs Reviewed  POCT URINALYSIS DIP (DEVICE) - Abnormal; Notable for the following:    Glucose, UA 500 (*)    All other components within normal limits   Imaging Review No results found.  MDM  No diagnosis found. Abdominal exam is unremarkable. History is vague. I suspect she may be having IBS symptoms. Levsin 0.125 mg by mouth Q4 to 6 hours when necessary abdominal discomfort. Call your PCP for an appointment for followup later this week or early next week.     Hayden Rasmussen, NP 10/09/12 1747

## 2012-10-10 ENCOUNTER — Telehealth: Payer: Self-pay | Admitting: *Deleted

## 2012-10-10 NOTE — Telephone Encounter (Signed)
Should be fine to take this medication as a trial.  Please have her follow up with PCP or [facility] resident for ED follow up in 1-2 weeks.

## 2012-10-10 NOTE — Telephone Encounter (Signed)
Pt called stating she was seen in ED yesterday 9/2 for abdominal pain.  She was given a Rx for Levsin -sl, and she wants to know if it's okay for her to take this med. Her exam was unremarkable, Dx ? IBS.  Pt # [phone]

## 2012-10-11 NOTE — ED Provider Notes (Signed)
Medical screening examination/treatment/procedure(s) were performed by a resident physician or non-physician practitioner and as the supervising physician I was immediately available for consultation/collaboration.  Clementeen Graham, MD   Rodolph Bong, MD 10/11/12 [phone]

## 2012-10-11 NOTE — Telephone Encounter (Signed)
Appointment made

## 2012-10-16 ENCOUNTER — Ambulatory Visit: Payer: Self-pay | Admitting: Internal Medicine

## 2012-10-18 NOTE — Addendum Note (Signed)
Addended by: Bufford Spikes on: 10/18/2012 09:17 AM   Modules accepted: Orders

## 2012-10-18 NOTE — Addendum Note (Signed)
Addended by: Tavi Hoogendoorn N on: 10/18/2012 09:17 AM   Modules accepted: Orders  

## 2012-10-21 ENCOUNTER — Other Ambulatory Visit: Payer: Self-pay | Admitting: Internal Medicine

## 2012-10-24 ENCOUNTER — Other Ambulatory Visit: Payer: Self-pay | Admitting: Internal Medicine

## 2012-11-10 ENCOUNTER — Other Ambulatory Visit: Payer: Self-pay | Admitting: Internal Medicine

## 2012-11-29 ENCOUNTER — Other Ambulatory Visit: Payer: Self-pay | Admitting: Internal Medicine

## 2012-12-01 ENCOUNTER — Other Ambulatory Visit: Payer: Self-pay | Admitting: Internal Medicine

## 2012-12-04 ENCOUNTER — Encounter: Payer: Self-pay | Admitting: Internal Medicine

## 2012-12-10 ENCOUNTER — Other Ambulatory Visit: Payer: Self-pay | Admitting: *Deleted

## 2012-12-10 DIAGNOSIS — E119 Type 2 diabetes mellitus without complications: Secondary | ICD-10-CM

## 2012-12-10 MED ORDER — PIOGLITAZONE HCL 15 MG PO TABS: 15.0000 mg | ORAL_TABLET | Freq: Every day | ORAL | Status: DC

## 2012-12-12 ENCOUNTER — Other Ambulatory Visit: Payer: Self-pay | Admitting: Internal Medicine

## 2012-12-14 ENCOUNTER — Other Ambulatory Visit: Payer: Self-pay | Admitting: *Deleted

## 2012-12-14 NOTE — Telephone Encounter (Signed)
Fax from [facility] - check qty  #120 or #60  For 2 tabs 2 times a day Thanks

## 2012-12-15 MED ORDER — GLIPIZIDE 10 MG PO TABS: ORAL_TABLET | ORAL | Status: DC

## 2012-12-17 NOTE — Telephone Encounter (Signed)
Message sent to front office for an appt.  

## 2012-12-31 NOTE — Telephone Encounter (Signed)
Was able to contact pt by Calling patients mother, who is pt emergency contact, and got pts new number- [phone]. Pt said she will call after the thanksgiving holiday to book an appointment.

## 2013-01-14 NOTE — Telephone Encounter (Signed)
Message sent to front office to schedule pt an appt. 

## 2013-01-18 ENCOUNTER — Encounter (HOSPITAL_COMMUNITY): Payer: Self-pay | Admitting: Emergency Medicine

## 2013-01-18 ENCOUNTER — Emergency Department (HOSPITAL_COMMUNITY): Payer: Self-pay

## 2013-01-18 ENCOUNTER — Ambulatory Visit: Payer: Self-pay | Admitting: Internal Medicine

## 2013-01-18 ENCOUNTER — Emergency Department (HOSPITAL_COMMUNITY)
Admission: EM | Admit: 2013-01-18 | Discharge: 2013-01-18 | Disposition: A | Discharge status: Home / Self Care | Payer: Self-pay | Attending: Emergency Medicine | Admitting: Emergency Medicine

## 2013-01-18 DIAGNOSIS — N183 Chronic kidney disease, stage 3 unspecified: Secondary | ICD-10-CM | POA: Insufficient documentation

## 2013-01-18 DIAGNOSIS — IMO0001 Reserved for inherently not codable concepts without codable children: Secondary | ICD-10-CM | POA: Insufficient documentation

## 2013-01-18 DIAGNOSIS — F3289 Other specified depressive episodes: Secondary | ICD-10-CM | POA: Insufficient documentation

## 2013-01-18 DIAGNOSIS — F329 Major depressive disorder, single episode, unspecified: Secondary | ICD-10-CM | POA: Insufficient documentation

## 2013-01-18 DIAGNOSIS — M545 Low back pain, unspecified: Secondary | ICD-10-CM | POA: Insufficient documentation

## 2013-01-18 DIAGNOSIS — Z8774 Personal history of (corrected) congenital malformations of heart and circulatory system: Secondary | ICD-10-CM | POA: Insufficient documentation

## 2013-01-18 DIAGNOSIS — I129 Hypertensive chronic kidney disease with stage 1 through stage 4 chronic kidney disease, or unspecified chronic kidney disease: Secondary | ICD-10-CM | POA: Insufficient documentation

## 2013-01-18 DIAGNOSIS — F411 Generalized anxiety disorder: Secondary | ICD-10-CM | POA: Insufficient documentation

## 2013-01-18 DIAGNOSIS — Z792 Long term (current) use of antibiotics: Secondary | ICD-10-CM | POA: Insufficient documentation

## 2013-01-18 DIAGNOSIS — Z7982 Long term (current) use of aspirin: Secondary | ICD-10-CM | POA: Insufficient documentation

## 2013-01-18 DIAGNOSIS — E785 Hyperlipidemia, unspecified: Secondary | ICD-10-CM | POA: Insufficient documentation

## 2013-01-18 DIAGNOSIS — Z862 Personal history of diseases of the blood and blood-forming organs and certain disorders involving the immune mechanism: Secondary | ICD-10-CM | POA: Insufficient documentation

## 2013-01-18 DIAGNOSIS — N390 Urinary tract infection, site not specified: Secondary | ICD-10-CM | POA: Insufficient documentation

## 2013-01-18 DIAGNOSIS — Z79899 Other long term (current) drug therapy: Secondary | ICD-10-CM | POA: Insufficient documentation

## 2013-01-18 DIAGNOSIS — Z8719 Personal history of other diseases of the digestive system: Secondary | ICD-10-CM | POA: Insufficient documentation

## 2013-01-18 DIAGNOSIS — G8929 Other chronic pain: Secondary | ICD-10-CM | POA: Insufficient documentation

## 2013-01-18 HISTORY — DX: Other intervertebral disc degeneration, lumbar region without mention of lumbar back pain or lower extremity pain: M51.369

## 2013-01-18 HISTORY — DX: Other intervertebral disc degeneration, lumbar region: M51.36

## 2013-01-18 HISTORY — DX: Unspecified abdominal pain: R10.9

## 2013-01-18 HISTORY — DX: Other chronic pain: G89.29

## 2013-01-18 HISTORY — DX: Dorsalgia, unspecified: M54.9

## 2013-01-18 HISTORY — DX: Irritable bowel syndrome, unspecified: K58.9

## 2013-01-18 LAB — COMPREHENSIVE METABOLIC PANEL
Albumin: 4.2 g/dL (ref 3.5–5.2)
BUN: 37 mg/dL — ABNORMAL HIGH (ref 6–23)
Creatinine, Ser: 1.37 mg/dL — ABNORMAL HIGH (ref 0.50–1.10)
GFR calc Af Amer: 49 mL/min — ABNORMAL LOW (ref >90)
Total Protein: 7.9 g/dL (ref 6.0–8.3)

## 2013-01-18 LAB — URINALYSIS, ROUTINE W REFLEX MICROSCOPIC
Glucose, UA: 500 mg/dL — AB
Nitrite: NEGATIVE
Protein, ur: NEGATIVE mg/dL
pH: 5 (ref 5.0–8.0)

## 2013-01-18 LAB — CBC WITH DIFFERENTIAL/PLATELET
Basophils Relative: 0 % (ref 0–1)
Eosinophils Absolute: 0 10*3/uL (ref 0.0–0.7)
Eosinophils Relative: 0 % (ref 0–5)
HCT: 38.8 % (ref 36.0–46.0)
Hemoglobin: 13.9 g/dL (ref 12.0–15.0)
MCH: 30.8 pg (ref 26.0–34.0)
MCHC: 35.8 g/dL (ref 30.0–36.0)
MCV: 86 fL (ref 78.0–100.0)
Monocytes Absolute: 0.3 10*3/uL (ref 0.1–1.0)
Monocytes Relative: 4 % (ref 3–12)

## 2013-01-18 LAB — LIPASE, BLOOD: Lipase: 27 U/L (ref 11–59)

## 2013-01-18 LAB — URINE MICROSCOPIC-ADD ON

## 2013-01-18 MED ORDER — CEPHALEXIN 500 MG PO CAPS: 500.0000 mg | ORAL_CAPSULE | Freq: Four times a day (QID) | ORAL | Status: DC

## 2013-01-18 MED ORDER — METHOCARBAMOL 500 MG PO TABS: 1000.0000 mg | ORAL_TABLET | Freq: Four times a day (QID) | ORAL | Status: DC | PRN

## 2013-01-18 MED ORDER — HYDROCODONE-ACETAMINOPHEN 5-325 MG PO TABS: ORAL_TABLET | ORAL | Status: DC

## 2013-01-18 NOTE — ED Notes (Signed)
Pt c/o lower back and lower abd pain starting this am; pt sts hx of chronic issues with back

## 2013-01-18 NOTE — ED Notes (Signed)
Patient transported to CT 

## 2013-01-18 NOTE — ED Provider Notes (Signed)
CSN: [id number]     Arrival date & time 01/18/13  1146 History   First MD Initiated Contact with Patient 01/18/13 1220     Chief Complaint  Patient presents with  . Back Pain  . Abdominal Pain    HPI Pt was seen at 1225. Per pt, c/o sudden onset and persistence of waxing and waning left sided low back "pain" for the past several hours. Pain worsens with palpation of the area and body position changes. States she took OTC tylenol with partial relief. Describes the pain as "aching" and "I'm not sure if it's my chronic back pain," but also states she is concerned she "might have a UTI." Denies dysuria/hematuria, no vaginal bleeding/discharge. Denies incont/retention of bowel or bladder, no saddle anesthesia, no focal motor weakness, no tingling/numbness in extremities, no fevers, no injury, no abd pain.    Past Medical History  Diagnosis Date  . Diabetes mellitus type II, uncontrolled   . Hypertension   . Hyperlipidemia   . Ovarian cyst, left   . Anemia     due to menorrhagia, BL 8-10  . Vaginal cyst     nabothian and bartholin  . CKD (chronic kidney disease) stage 3, GFR 30-59 ml/min     baseline creatinine 1.4-1.7  . Anxiety   . Depression   . Postmenopausal bleeding 06/12/2008  . 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 UTI (lower urinary tract infection)   . IBS (irritable bowel syndrome)   . Chronic back pain   . Chronic abdominal pain   . DDD (degenerative disc disease), lumbar    Past Surgical History  Procedure Laterality Date  . Cardiac surgery      to repair congenital defect as a child   Family History  Problem Relation Age of Onset  . Stroke Father   . Heart attack Father     Had MI in his 51s  . Stomach cancer Paternal Grandmother   . Diabetes Maternal Grandmother   . Cerebral palsy Daughter   . Anesthesia problems Neg Hx   . Hypotension Neg Hx   . Malignant hyperthermia Neg Hx   . Pseudochol deficiency Neg Hx    History  Substance  Use Topics  . Smoking status: Never Smoker   . Smokeless tobacco: Former Neurosurgeon  . Alcohol Use: No   OB History   Grav Para Term Preterm Abortions TAB SAB Ect Mult Living   3 1 1  2  2   1      Review of Systems ROS: Statement: All systems negative except as marked or noted in the HPI; Constitutional: Negative for fever and chills. ; ; Eyes: Negative for eye pain, redness and discharge. ; ; ENMT: Negative for ear pain, hoarseness, nasal congestion, sinus pressure and sore throat. ; ; Cardiovascular: Negative for chest pain, palpitations, diaphoresis, dyspnea and peripheral edema. ; ; Respiratory: Negative for cough, wheezing and stridor. ; ; Gastrointestinal: Negative for nausea, vomiting, diarrhea, abdominal pain, blood in stool, hematemesis, jaundice and rectal bleeding. . ; ; Genitourinary: Negative for dysuria, flank pain and hematuria. ; ; Musculoskeletal: +LBP. Negative for neck pain. Negative for swelling and trauma.; ; GYN:  No vaginal bleeding, no vaginal discharge, no vulvar pain.;; Skin: Negative for pruritus, rash, abrasions, blisters, bruising and skin lesion.; ; Neuro: Negative for headache, lightheadedness and neck stiffness. Negative for weakness, altered level of consciousness , altered mental status, extremity weakness, paresthesias, involuntary movement, seizure and syncope.  Allergies  Review of patient's allergies indicates no known allergies.  Home Medications   Current Outpatient Rx  Name  Route  Sig  Dispense  Refill  . aspirin 81 MG tablet   Oral   Take 81 mg by mouth daily.         . bisacodyl (DULCOLAX) 5 MG EC tablet   Oral   Take 5 mg by mouth daily as needed for constipation.         . cephALEXin (KEFLEX) 500 MG capsule   Oral   Take 2 capsules (1,000 mg total) by mouth 2 (two) times daily.   40 capsule   0   . cephALEXin (KEFLEX) 500 MG capsule   Oral   Take 1 capsule (500 mg total) by mouth 4 (four) times daily.   40 capsule   0   .  Enalapril-Hydrochlorothiazide 5-12.5 MG per tablet      TAKE 1 TABLET BY MOUTH DAILY.   30 tablet   1     No refills available   . glipiZIDE (GLUCOTROL) 10 MG tablet      TAKE 2 TABLETS (20 MG TOTAL) BY MOUTH 2 (TWO) TIMES DAILY.   120 tablet   0     CYCLE FILL MEDICATION. Authorization is required f ...   . HYDROcodone-acetaminophen (NORCO/VICODIN) 5-325 MG per tablet      1 or 2 tabs PO q6 hours prn pain   15 tablet   0   . hyoscyamine (LEVSIN/SL) 0.125 MG SL tablet   Sublingual   Place 1 tablet (0.125 mg total) under the tongue every 4 (four) hours as needed for cramping.   10 tablet   0   . methocarbamol (ROBAXIN) 500 MG tablet   Oral   Take 2 tablets (1,000 mg total) by mouth 4 (four) times daily as needed for muscle spasms (muscle spasm/pain).   25 tablet   0   . pioglitazone (ACTOS) 15 MG tablet   Oral   Take 1 tablet (15 mg total) by mouth daily.   90 tablet   1   . simvastatin (ZOCOR) 40 MG tablet   Oral   Take 40 mg by mouth every evening.         . simvastatin (ZOCOR) 40 MG tablet      TAKE 1 TABLET (40 MG TOTAL) BY MOUTH AT BEDTIME.   30 tablet   0     CYCLE FILL MEDICATION. Authorization is required f ...   . traMADol (ULTRAM) 50 MG tablet   Oral   Take 50 mg by mouth every 6 (six) hours as needed for pain.          BP 142/89  Pulse 98  Temp(Src) 98.2 F (36.8 C) (Oral)  Ht 5\' 4"  (1.626 m)  Wt 163 lb (73.936 kg)  BMI 27.97 kg/m2  SpO2 97% Physical Exam 1230: Physical examination:  Nursing notes reviewed; Vital signs and O2 SAT reviewed;  Constitutional: Well developed, Well nourished, Well hydrated, In no acute distress; Head:  Normocephalic, atraumatic; Eyes: EOMI, PERRL, No scleral icterus; ENMT: Mouth and pharynx normal, Mucous membranes moist; Neck: Supple, Full range of motion, No lymphadenopathy; Cardiovascular: Regular rate and rhythm, No murmur, rub, or gallop; Respiratory: Breath sounds clear & equal bilaterally, No rales,  rhonchi, wheezes.  Speaking full sentences with ease, Normal respiratory effort/excursion; Chest: Nontender, Movement normal; Abdomen: Soft, Nontender, Nondistended, Normal bowel sounds; Genitourinary: No CVA tenderness; Spine:  No midline CS, TS, LS tenderness. +  TTP left lumbar paraspinal muscles.;;  Extremities: Pulses normal, No tenderness, No edema, No calf edema or asymmetry.; Neuro: AA&Ox3, Major CN grossly intact.  Speech clear. Strength 5/5 equal bilat UE's and LE's, including great toe dorsiflexion.  DTR 2/4 equal bilat UE's and LE's.  No gross sensory deficits.  Neg straight leg raises bilat. Climbs on and off stretcher easily by herself. Gait steady.; Skin: Color normal, Warm, Dry.   ED Course  Procedures   EKG Interpretation   None       MDM  MDM Reviewed: previous chart, nursing note and vitals Reviewed previous: labs Interpretation: labs and CT scan     Results for orders placed during the hospital encounter of 01/18/13  CBC WITH DIFFERENTIAL      Result Value Range   WBC 6.9  4.0 - 10.5 K/uL   RBC 4.51  3.87 - 5.11 MIL/uL   Hemoglobin 13.9  12.0 - 15.0 g/dL   HCT 16.1  09.6 - 04.5 %   MCV 86.0  78.0 - 100.0 fL   MCH 30.8  26.0 - 34.0 pg   MCHC 35.8  30.0 - 36.0 g/dL   RDW 40.9  81.1 - 91.4 %   Platelets 198  150 - 400 K/uL   Neutrophils Relative % 68  43 - 77 %   Neutro Abs 4.7  1.7 - 7.7 K/uL   Lymphocytes Relative 28  12 - 46 %   Lymphs Abs 1.9  0.7 - 4.0 K/uL   Monocytes Relative 4  3 - 12 %   Monocytes Absolute 0.3  0.1 - 1.0 K/uL   Eosinophils Relative 0  0 - 5 %   Eosinophils Absolute 0.0  0.0 - 0.7 K/uL   Basophils Relative 0  0 - 1 %   Basophils Absolute 0.0  0.0 - 0.1 K/uL  COMPREHENSIVE METABOLIC PANEL      Result Value Range   Sodium 134 (*) 135 - 145 mEq/L   Potassium 4.4  3.5 - 5.1 mEq/L   Chloride 94 (*) 96 - 112 mEq/L   CO2 28  19 - 32 mEq/L   Glucose, Bld 351 (*) 70 - 99 mg/dL   BUN 37 (*) 6 - 23 mg/dL   Creatinine, Ser 7.82 (*) 0.50 -  1.10 mg/dL   Calcium 95.6  8.4 - 21.3 mg/dL   Total Protein 7.9  6.0 - 8.3 g/dL   Albumin 4.2  3.5 - 5.2 g/dL   AST 22  0 - 37 U/L   ALT 26  0 - 35 U/L   Alkaline Phosphatase 122 (*) 39 - 117 U/L   Total Bilirubin 0.5  0.3 - 1.2 mg/dL   GFR calc non Af Amer 43 (*) >90 mL/min   GFR calc Af Amer 49 (*) >90 mL/min  URINALYSIS, ROUTINE W REFLEX MICROSCOPIC      Result Value Range   Color, Urine YELLOW  YELLOW   APPearance CLOUDY (*) CLEAR   Specific Gravity, Urine 1.018  1.005 - 1.030   pH 5.0  5.0 - 8.0   Glucose, UA 500 (*) NEGATIVE mg/dL   Hgb urine dipstick TRACE (*) NEGATIVE   Bilirubin Urine NEGATIVE  NEGATIVE   Ketones, ur NEGATIVE  NEGATIVE mg/dL   Protein, ur NEGATIVE  NEGATIVE mg/dL   Urobilinogen, UA 0.2  0.0 - 1.0 mg/dL   Nitrite NEGATIVE  NEGATIVE   Leukocytes, UA LARGE (*) NEGATIVE  LIPASE, BLOOD      Result Value Range  Lipase 27  11 - 59 U/L  URINE MICROSCOPIC-ADD ON      Result Value Range   Squamous Epithelial / LPF MANY (*) RARE   WBC, UA 21-50  <3 WBC/hpf   RBC / HPF 0-2  <3 RBC/hpf   Bacteria, UA MANY (*) RARE   Urine-Other MUCOUS PRESENT     Ct Abdomen Pelvis Wo Contrast 01/18/2013   CLINICAL DATA:  Chronic low back pain now with increased symptoms as well as lower abdominal pain.  EXAM: CT ABDOMEN AND PELVIS WITHOUT CONTRAST  TECHNIQUE: Multidetector CT imaging of the abdomen and pelvis was performed following the standard protocol without intravenous contrast.  COMPARISON:  CT scan of the abdomen dated September 19, 2012.  FINDINGS: The kidneys are normal in contour. There are multiple parenchymal calcifications demonstrated. No collecting system calcifications are demonstrated. There is no hydronephrosis nor hydroureter. The partially distended urinary bladder is normal in appearance. The uterus is normal in contour and exhibits multiple punctate calcifications consistent with fibroids. There is no adnexal mass or free pelvic fluid.  The liver, gallbladder,  fatty replaced pancreas, spleen, and adrenal glands are normal in appearance. The stomach is partially distended with fluid and is grossly normal. The unopacified loops of small and large bowel exhibit no evidence of ileus nor obstruction. A normal calibered, uninflamed-appearing appendix is demonstrated. It contains radiodense material. The abdominal aorta is normal in caliber. There is no periaortic or pericaval lymphadenopathy. There is no evidence of ascites. There is no inguinal hernia. There is a small fat containing umbilical hernia.  The lung bases exhibit basilar atelectasis versus fibrosis. The lumbar vertebral bodies are preserved in height. There is degenerative disc space narrowing with a large anterior endplate osteophyte formation at the L1-2 level. The observed portions of the bony pelvis appear normal.  IMPRESSION: 1. There is no evidence of obstruction of the urinary tracts nor calcified stones. There are multiple calcific lesions within the kidneys which have been previously described and for which MRI is again recommended. 2. There is no evidence of acute hepatobiliary abnormality nor acute bowel abnormality. 3. There are likely small fibroids within the uterus. There are no adnexal masses demonstrated. 4. No intra-abdominal or pelvic lymphadenopathy nor inflammatory mass or abscess is demonstrated. 5. There are degenerative disc changes principally at the L1-2 level. There may be bulging disc material posteriorly at this level and at other levels. Followup lumbar MRI may be useful.   Electronically Signed   By: David  [place]   On: 01/18/2013 13:01   Results for COLETA, GROSSHANS (MRN [id number]) as of 01/18/2013 14:04  Ref. Range 07/20/2011 03:37 07/25/2012 14:53 09/15/2012 17:36 09/19/2012 19:20 01/18/2013 10:12  BUN Latest Range: 6-23 mg/dL 36 (H) 29 (H) 38 (H) 33 (H) 37 (H)  Creatinine Latest Range: 0.50-1.10 mg/dL 8.46 (H) 9.62 (H) 9.52 (H) 1.34 (H) 1.37 (H)     1355:  BUN/Cr per baseline.  CBG elevated per hx DM, but not acidotic with AG 12.  Pt has been ambulatory with steady gait around the ED. States she wants to go home now. Will tx for UTI with abx while UC pending. Will tx LBP symptomatically. Dx and testing d/w pt.  Questions answered.  Verb understanding, agreeable to d/c home with outpt f/u.    Laray Anger, DO 01/21/13 [phone]

## 2013-01-18 NOTE — ED Notes (Signed)
Went to discharge pt. - pt speaking loudly on phone to outpatient pharmacy. Pt sts "I need you to come back later" and  Continuing conversation on phone.

## 2013-01-18 NOTE — ED Notes (Signed)
Family at bedside. 

## 2013-01-19 ENCOUNTER — Telehealth: Payer: Self-pay | Admitting: Internal Medicine

## 2013-01-19 ENCOUNTER — Telehealth: Payer: Self-pay | Admitting: Family Medicine

## 2013-01-19 DIAGNOSIS — N390 Urinary tract infection, site not specified: Secondary | ICD-10-CM

## 2013-01-19 LAB — URINE CULTURE: Colony Count: >=100000

## 2013-01-19 MED ORDER — CIPROFLOXACIN HCL 500 MG PO TABS: 500.0000 mg | ORAL_TABLET | Freq: Two times a day (BID) | ORAL | Status: AC

## 2013-01-19 NOTE — Telephone Encounter (Signed)
Patient was not a patient of ours.

## 2013-01-19 NOTE — Telephone Encounter (Signed)
[name] called back requesting Rx for Ciprofloxacin for her UTI as she has taken this before and had immediate improvement of her symptoms while taking it.   Sent it in to her Pharmacy.   Ciprofloxacin 500mg  BID for 5 days.

## 2013-01-19 NOTE — Telephone Encounter (Signed)
Received pager from Ms. Brosch. She was seen at the ED on 12/12 for UTI and was sent home with prescription for Keflex. She states that she still has low back pain but she is not sure if this is her chronic lower back pain. She has not used heating pads. She was told in the ED that she would take Tylenol for pain but she had not taken it yet.   I advised her to continue taking her antibiotic and to drink stay hydrated. I reviewed her labs with her and informed her that her urine culture has not resulted yet. She was instructed to return to the ED if she had nausea, vomiting, fever/chills, or worsening pain. She verbalized understanding.

## 2013-01-20 ENCOUNTER — Encounter (HOSPITAL_COMMUNITY): Payer: Self-pay | Admitting: Emergency Medicine

## 2013-01-20 ENCOUNTER — Emergency Department (HOSPITAL_COMMUNITY)
Admission: EM | Admit: 2013-01-20 | Discharge: 2013-01-20 | Discharge status: Against Medical Advice | Payer: Self-pay | Attending: Emergency Medicine | Admitting: Emergency Medicine

## 2013-01-20 DIAGNOSIS — R109 Unspecified abdominal pain: Secondary | ICD-10-CM | POA: Insufficient documentation

## 2013-01-20 NOTE — ED Notes (Signed)
No answer when pt's name called in the lobby, unable to locate pt; pt eloped prior to being seen.

## 2013-01-20 NOTE — ED Notes (Signed)
No answer from patient when name called in lobby, unable to locate pt.

## 2013-01-20 NOTE — ED Notes (Signed)
Pt c/o lower back pain. Pt was seen at [facility] ED on the 12/12 and did CT scan and UA started on antibiotics and waiting to hear back on culture. Pt also has lower abd pain

## 2013-01-20 NOTE — ED Notes (Signed)
2000 No answer when name called in lobby, unable to locate pt.

## 2013-01-25 ENCOUNTER — Ambulatory Visit (INDEPENDENT_AMBULATORY_CARE_PROVIDER_SITE_OTHER): Payer: Self-pay | Admitting: Internal Medicine

## 2013-01-25 ENCOUNTER — Encounter: Payer: Self-pay | Admitting: Internal Medicine

## 2013-01-25 VITALS — BP 132/75 | HR 99 | Temp 97.4°F | Ht 63.0 in | Wt 158.2 lb

## 2013-01-25 DIAGNOSIS — E1129 Type 2 diabetes mellitus with other diabetic kidney complication: Secondary | ICD-10-CM

## 2013-01-25 DIAGNOSIS — N390 Urinary tract infection, site not specified: Secondary | ICD-10-CM | POA: Insufficient documentation

## 2013-01-25 DIAGNOSIS — N058 Unspecified nephritic syndrome with other morphologic changes: Secondary | ICD-10-CM

## 2013-01-25 LAB — POCT GLYCOSYLATED HEMOGLOBIN (HGB A1C): Hemoglobin A1C: 12.6

## 2013-01-25 NOTE — Assessment & Plan Note (Signed)
Assessment Patient states that she is finishing Cipro today and symptoms free today.  Plan - Based on her abd CT results, she will need abd/pelvic MRI for follow up.  However, she states that she does not have a job/income/insurance.  And she is unable to pay for the bills. -I discussed importance to get all pertinent documents for her orange card application.  She states that she is too busy to think about it.  I encouraged her to talk to the front desk a to see see if she can have an appointment with Gavin Pound on Monday.  She states that she will try.

## 2013-01-25 NOTE — Assessment & Plan Note (Addendum)
Lab Results  Component Value Date   HGBA1C 12.6 01/25/2013   HGBA1C 13.1 07/25/2012   HGBA1C 11.4* 07/20/2011     Assessment: Diabetes control: poor control (HgbA1C >9%) Progress toward A1C goal:  deteriorated  Patient has uncontrolled type 2 diabetes. She admits dietary indiscretion and noncompliance with her blood sugar checks. She is noncompliant with her medical treatment due to cost and lack of understanding. She states that she is unable to afford insulin and seems to avoid the topic when I tried to discuss with her about the insulin therapy.  It seems that she does not want to use insulin.  Plan: Medications:  continue current medications Home glucose monitoring: Frequency: no home glucose monitoring Timing:   Instruction/counseling given: reminded to get eye exam, reminded to bring blood glucose meter & log to each visit, reminded to bring medications to each visit and discussed foot care Educational resources provided:   Self management tools provided:   Other plans:   1. detailed discussion with patient about importance of controlling her diabetes  2. Instruct to check blood glucose 4 times before all meals and at bedtime.  3. Instructed to see Lupita Leash and me in 2-3 weeks.  4. Discussed the importance for her to start the insulin therapy, and she states that she does not want to do it but will consider it.  I stressed to her that her glycemic toxicity is unlikely controlled by oral medications, and she would need the insulin therapy.  The possible complications of uncontrolled diabetes are discussed with patient such as stroke, CAD and/or end-stage renal disease.  She states that she'll think about it.  And she will talk to Stanton Kidney about the application of the orange card.  I encouraged her to followup with the clinic in 2 weeks which will give her some time for the approval of orange card. She understands and agrees with above plan.

## 2013-01-25 NOTE — Progress Notes (Signed)
Patient: [name]   MRN: [id number]  DOB: Apr 08, 1955  PCP: Kennis Carina, MD   Subjective:    CC: F/U Visit   HPI: [name] is a 57 y.o. female with a PMHx of T2DM, HTN, HLD, CKD stage 3, Anxiety, depression and chronic back pain, who presented to clinic today for the following:  1) ED follow up on UTI    Patient was evaluated for back/abdomen pain at ED on 01/18/2013. The etiology was thought to be UTI and she was given Cipro 250 BID. She reports medical compliance. She feels better now. No abdominal pain. She has a chronic back pain and states that her back pain is at baseline.   An Abd CT showed   1. There is no evidence of obstruction of the urinary tracts nor calcified stones. There are multiple calcific lesions within the kidneys which have been previously described and for which MRI is again recommended.  2. There is no evidence of acute hepatobiliary abnormality nor acute bowel abnormality.  3. There are likely small fibroids within the uterus. There are no adnexal masses demonstrated.  4. No intra-ab     dominal or pelvic lymphadenopathy nor inflammatory mass or abscess is demonstrated.  5. There are degenerative disc changes principally at the L1-2 level. There may be bulging disc material posteriorly at this level and at other levels. Followup lumbar MRI may be useful.   # T2DM, uncontrolled     Patient has had uncontrolled DM since 2011 per chart review. She has not been compliant with her appointment and medical advices. She has many life stressors and is constantly talking about her life stressors during the interview today to the point where I could not find time to talk. She is unable to afford her medication or insulin. She told me that she has not been "pushing" for the orange card papers. Then she admits that she lost the orange card application papers and has not scheduled an appt with Stanton Kidney.     She states that she does not want to take insulin even if  she has an orange card. She prefers the oral medication now. However, she is not compliant with her follow up appointment and oral medications either.   Review of Systems: Per HPI.   Current Outpatient Medications: Current Outpatient Prescriptions  Medication Sig Dispense Refill  . aspirin EC 81 MG tablet Take 81 mg by mouth daily.      . Enalapril-Hydrochlorothiazide 5-12.5 MG per tablet Take 1 tablet by mouth daily.      Marland Kitchen glipiZIDE (GLUCOTROL) 10 MG tablet Take 20 mg by mouth 2 (two) times daily.      Marland Kitchen HYDROcodone-acetaminophen (NORCO/VICODIN) 5-325 MG per tablet 1 or 2 tabs PO q6 hours prn pain  15 tablet  0  . methocarbamol (ROBAXIN) 500 MG tablet Take 2 tablets (1,000 mg total) by mouth 4 (four) times daily as needed for muscle spasms (muscle spasm/pain).  25 tablet  0  . pioglitazone (ACTOS) 15 MG tablet Take 1 tablet (15 mg total) by mouth daily.  90 tablet  1  . simvastatin (ZOCOR) 40 MG tablet Take 40 mg by mouth every evening.      . [DISCONTINUED] pantoprazole (PROTONIX) 20 MG tablet Take 2 tablets (40 mg total) by mouth daily.  30 tablet  1  . [DISCONTINUED] sertraline (ZOLOFT) 100 MG tablet Take 1 tablet (100 mg total) by mouth daily.  30 tablet  2   No current facility-administered  medications for this visit.    Allergies: No Known Allergies  Past Medical History  Diagnosis Date  . Diabetes mellitus type II, uncontrolled   . Hypertension   . Hyperlipidemia   . Ovarian cyst, left   . Anemia     due to menorrhagia, BL 8-10  . Vaginal cyst     nabothian and bartholin  . CKD (chronic kidney disease) stage 3, GFR 30-59 ml/min     baseline creatinine 1.4-1.7  . Anxiety   . Depression   . Postmenopausal bleeding 06/12/2008  . 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 UTI (lower urinary tract infection)   . IBS (irritable bowel syndrome)   . Chronic back pain   . Chronic abdominal pain   . DDD (degenerative disc disease), lumbar      Objective:    Physical Exam: Filed Vitals:   01/25/13 1324  BP: 132/75  Pulse: 99  Temp: 97.4 F (36.3 C)  TempSrc: Oral  Height: 5\' 3"  (1.6 m)  Weight: 158 lb 3.2 oz (71.759 kg)  SpO2: 97%    General: Vital signs reviewed and noted. Well-developed, well-nourished, in no acute distress; alert, appropriate and cooperative throughout examination.  Head: Normocephalic, atraumatic.  Lungs:  Normal respiratory effort. Clear to auscultation BL without crackles or wheezes.  Heart: RRR. S1 and S2 normal without gallop, rubs, murmur.  Abdomen:  BS normoactive. Soft, Nondistended, non-tender.  No masses or organomegaly.  Extremities: No pretibial edema.   Assessment/ Plan:

## 2013-01-25 NOTE — Patient Instructions (Signed)
1. Please set up appt with [name] ASAP for your orange card 2. Will need to start insulin as soon as you have Orange card 3. Will have [name] see you for insulin. 4. Follow up in 2 weeks.   Diabetes and Standards of Medical Care  Diabetes is complicated. You may find that your diabetes team includes a dietitian, nurse, diabetes educator, eye doctor, and more. To help everyone know what is going on and to help you get the care you deserve, the following schedule of care was developed to help keep you on track. Below are the tests, exams, vaccines, medicines, education, and plans you will need. HbA1c test This test shows how well you have controlled your glucose over the past 2 3 months. It is used to see if your diabetes management plan needs to be adjusted.   It is performed at least 2 times a year if you are meeting treatment goals.  It is performed 4 times a year if therapy has changed or if you are not meeting treatment goals. Blood pressure test  This test is performed at every routine medical visit. The goal is less than 140/90 mmHg for most people, but 130/80 mmHg in some cases. Ask your health care provider about your goal. Dental exam  Follow up with the dentist regularly. Eye exam  If you are diagnosed with type 1 diabetes as a child, get an exam upon reaching the age of 10 years or older and have had diabetes for 3 5 years. Yearly eye exams are recommended after that initial eye exam.  If you are diagnosed with type 1 diabetes as an adult, get an exam within 5 years of diagnosis and then yearly.  If you are diagnosed with type 2 diabetes, get an exam as soon as possible after the diagnosis and then yearly. Foot care exam  Visual foot exams are performed at every routine medical visit. The exams check for cuts, injuries, or other problems with the feet.  A comprehensive foot exam should be done yearly. This includes visual inspection as well as assessing foot pulses and testing  for loss of sensation.  Check your feet nightly for cuts, injuries, or other problems with your feet. Tell your health care provider if anything is not healing. Kidney function test (urine microalbumin)  This test is performed once a year.  Type 1 diabetes: The first test is performed 5 years after diagnosis.  Type 2 diabetes: The first test is performed at the time of diagnosis.  A serum creatinine and estimated glomerular filtration rate (eGFR) test is done once a year to assess the level of chronic kidney disease (CKD), if present. Lipid profile (cholesterol, HDL, LDL, triglycerides)  Performed every 5 years for most people.  The goal for LDL is less than 100 mg/dL. If you are at high risk, the goal is less than 70 mg/dL.  The goal for HDL is 40 mg/dL 50 mg/dL for men and 50 mg/dL 60 mg/dL for women. An HDL cholesterol of 60 mg/dL or higher gives some protection against heart disease.  The goal for triglycerides is less than 150 mg/dL. Influenza vaccine, pneumococcal vaccine, and hepatitis B vaccine  The influenza vaccine is recommended yearly.  The pneumococcal vaccine is generally given once in a lifetime. However, there are some instances when another vaccination is recommended. Check with your health care provider.  The hepatitis B vaccine is also recommended for adults with diabetes. Diabetes self-management education  Education is recommended at  diagnosis and ongoing as needed. Treatment plan  Your treatment plan is reviewed at every medical visit. Document Released: 11/21/2008 Document Revised: 09/26/2012 Document Reviewed: 06/26/2012 ExitCare Patient Information 2014 [employer].  

## 2013-01-26 ENCOUNTER — Other Ambulatory Visit: Payer: Self-pay | Admitting: Internal Medicine

## 2013-01-27 ENCOUNTER — Emergency Department (INDEPENDENT_AMBULATORY_CARE_PROVIDER_SITE_OTHER)
Admission: EM | Admit: 2013-01-27 | Discharge: 2013-01-27 | Disposition: A | Discharge status: Home / Self Care | Payer: Self-pay | Source: Home / Self Care

## 2013-01-27 ENCOUNTER — Encounter (HOSPITAL_COMMUNITY): Payer: Self-pay | Admitting: Emergency Medicine

## 2013-01-27 ENCOUNTER — Other Ambulatory Visit: Payer: Self-pay | Admitting: Internal Medicine

## 2013-01-27 ENCOUNTER — Telehealth: Payer: Self-pay | Admitting: Internal Medicine

## 2013-01-27 DIAGNOSIS — N390 Urinary tract infection, site not specified: Secondary | ICD-10-CM

## 2013-01-27 LAB — POCT URINALYSIS DIP (DEVICE)
Bilirubin Urine: NEGATIVE
Ketones, ur: NEGATIVE mg/dL
Leukocytes, UA: NEGATIVE
Specific Gravity, Urine: 1.02 (ref 1.005–1.030)

## 2013-01-27 MED ORDER — CIPROFLOXACIN HCL 250 MG PO TABS: 250.0000 mg | ORAL_TABLET | Freq: Two times a day (BID) | ORAL | Status: DC

## 2013-01-27 NOTE — Telephone Encounter (Signed)
Received page in on-call resident pagers from [name]. She states that she still has low back pain with urinary frequency and suprapubic pain. Denies dysuria, fever/chills, N/V/D. She requested a stronger antibiotic and was upset that her urine culture was not helpful to guide her treatment because it was not a "clean catch".   She was encouraged to go to the Urgent Care today for repeat urinalysis and urine culture or wait until Monday for an acute visit at the [facility] for further evaluation of her symptoms, repeat UA/urine culture and possible antibiotic tx. She was instructed to go to the ED if the had fever/chill, nausea, vomiting, diarrhea, or worsening of her pain. She verbalized understanding and requested that I sent a message to the clinic for an appointment for her on Monday.   Sent message to front desk in regards to scheduling an acute visit appointment for her on Monday.

## 2013-01-27 NOTE — ED Provider Notes (Signed)
[name] is a 57 y.o. female who presents to Urgent Care today for urinary frequency urgency and low back pain. This is consistent with prior episodes of urinary tract infections.  Patient has a relatively complicated recent medical history. She was recently diagnosed with a UTI but had a non-clean catch culture with multiple morphotypes. She was initially started with Keflex but requested to be switched to Cipro she feels this works better for her.  She took some but not all of these antibiotics. Her last dose was about a week ago. She notes recurrent symptoms since. She notes a mild low back pain as well and attributes this to urinary tract infection.   However his history is confused as the patient is not a very good historian.   When asked about her pressured speech she says that she always talks this way.   Past Medical History  Diagnosis Date  . Diabetes mellitus type II, uncontrolled   . Hypertension   . Hyperlipidemia   . Ovarian cyst, left   . Anemia     due to menorrhagia, BL 8-10  . Vaginal cyst     nabothian and bartholin  . CKD (chronic kidney disease) stage 3, GFR 30-59 ml/min     baseline creatinine 1.4-1.7  . Anxiety   . Depression   . Postmenopausal bleeding 06/12/2008  . 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 UTI (lower urinary tract infection)   . IBS (irritable bowel syndrome)   . Chronic back pain   . Chronic abdominal pain   . DDD (degenerative disc disease), lumbar    History  Substance Use Topics  . Smoking status: Never Smoker   . Smokeless tobacco: Former Neurosurgeon  . Alcohol Use: No   ROS as above Medications reviewed. No current facility-administered medications for this encounter.   Current Outpatient Prescriptions  Medication Sig Dispense Refill  . Enalapril-Hydrochlorothiazide 5-12.5 MG per tablet Take 1 tablet by mouth daily.      Marland Kitchen glipiZIDE (GLUCOTROL) 10 MG tablet Take 20 mg by mouth 2 (two) times daily.      .  pioglitazone (ACTOS) 15 MG tablet Take 1 tablet (15 mg total) by mouth daily.  90 tablet  1  . simvastatin (ZOCOR) 40 MG tablet Take 40 mg by mouth every evening.      Marland Kitchen aspirin EC 81 MG tablet Take 81 mg by mouth daily.      . ciprofloxacin (CIPRO) 250 MG tablet Take 1 tablet (250 mg total) by mouth 2 (two) times daily.  14 tablet  0  . HYDROcodone-acetaminophen (NORCO/VICODIN) 5-325 MG per tablet 1 or 2 tabs PO q6 hours prn pain  15 tablet  0  . methocarbamol (ROBAXIN) 500 MG tablet Take 2 tablets (1,000 mg total) by mouth 4 (four) times daily as needed for muscle spasms (muscle spasm/pain).  25 tablet  0  . [DISCONTINUED] pantoprazole (PROTONIX) 20 MG tablet Take 2 tablets (40 mg total) by mouth daily.  30 tablet  1  . [DISCONTINUED] sertraline (ZOLOFT) 100 MG tablet Take 1 tablet (100 mg total) by mouth daily.  30 tablet  2    Exam:  BP 116/69  Pulse 90  Temp(Src) 98.5 F (36.9 C) (Oral)  Resp 16  SpO2 100% Gen: Well NAD HEENT: EOMI,  MMM Lungs: Normal work of breathing. CTABL Heart: RRR no MRG Abd: NABS, Soft. NT, ND no CV angle tenderness to percussion Exts: Non edematous BL  LE, warm and well perfused.  Psych: Increased motor activity. Pressured tangential speech. No hallucinations or delusions expressed.   Results for orders placed during the hospital encounter of 01/27/13 (from the past 24 hour(s))  POCT URINALYSIS DIP (DEVICE)     Status: Abnormal   Collection Time    01/27/13  6:29 PM      Result Value Range   Glucose, UA >=1000 (*) NEGATIVE mg/dL   Bilirubin Urine NEGATIVE  NEGATIVE   Ketones, ur NEGATIVE  NEGATIVE mg/dL   Specific Gravity, Urine 1.020  1.005 - 1.030   Hgb urine dipstick TRACE (*) NEGATIVE   pH 5.5  5.0 - 8.0   Protein, ur NEGATIVE  NEGATIVE mg/dL   Urobilinogen, UA 0.2  0.0 - 1.0 mg/dL   Nitrite NEGATIVE  NEGATIVE   Leukocytes, UA NEGATIVE  NEGATIVE   No results found.  Assessment and Plan: 57 y.o. female with  1) UTI symptoms. Patient  possibly has a urinary tract infection. Urine culture is pending. Plan to treat with Cipro as patient insists. Recommend followup with primary care provider 2) diabetes poorly controlled followup with PCP 3) pressured speech: May be normal for patient. I have never met her before. However I'm somewhat concerned for mania. She appears to be stable and reasonable at this time. Followup with PCP.  Discussed warning signs or symptoms. Please see discharge instructions. Patient expresses understanding.      Rodolph Bong, MD 01/27/13 1910

## 2013-01-27 NOTE — ED Notes (Signed)
C/O frequent urination and urinary urgency without dysuria x9 days; was seen in ED and treated for UTI with Keflex, then pt called and requested different Rx - finished course of Cipro few days ago.  C/O low abd and back pain.

## 2013-01-28 ENCOUNTER — Telehealth: Payer: Self-pay | Admitting: *Deleted

## 2013-01-28 NOTE — Telephone Encounter (Signed)
Talked with pt - on Cipro now and waiting for urine culture. Encourage to drink fluids. Aware clinic days we are closed this week. Can always call Urgent Care. Stanton Kidney Consandra Laske RN 01/28/13 2:30PM

## 2013-01-29 LAB — URINE CULTURE
Colony Count: >=100000
Special Requests: NORMAL

## 2013-01-29 NOTE — Progress Notes (Signed)
Case discussed with Dr. Li at time of visit.  We reviewed the resident's history and exam and pertinent patient test results.  I agree with the assessment, diagnosis, and plan of care documented in the resident's note. 

## 2013-02-04 ENCOUNTER — Ambulatory Visit: Payer: Self-pay

## 2013-02-08 NOTE — Telephone Encounter (Signed)
Refill has been done.

## 2013-02-11 ENCOUNTER — Telehealth: Payer: Self-pay | Admitting: *Deleted

## 2013-02-11 NOTE — Telephone Encounter (Signed)
Pt called c/o of cramps only right foot past few weeks. Appt made Dr Burnard Bunting 02/13/13 9:15AM and will try to resh appt with Bonna Gains after appt time. Hilda Blades Steele Stracener RN 02/11/13 2:30PM

## 2013-02-12 ENCOUNTER — Ambulatory Visit: Payer: Self-pay

## 2013-02-13 ENCOUNTER — Ambulatory Visit: Payer: Self-pay | Admitting: Internal Medicine

## 2013-02-15 ENCOUNTER — Ambulatory Visit: Payer: Self-pay | Admitting: Internal Medicine

## 2013-03-15 ENCOUNTER — Other Ambulatory Visit: Payer: Self-pay | Admitting: Internal Medicine

## 2013-04-28 ENCOUNTER — Other Ambulatory Visit: Payer: Self-pay | Admitting: Internal Medicine

## 2013-04-30 ENCOUNTER — Encounter: Payer: Self-pay | Admitting: Internal Medicine

## 2013-05-08 ENCOUNTER — Ambulatory Visit: Payer: Self-pay | Admitting: Dietician

## 2013-05-08 ENCOUNTER — Telehealth: Payer: Self-pay | Admitting: *Deleted

## 2013-05-08 ENCOUNTER — Ambulatory Visit (INDEPENDENT_AMBULATORY_CARE_PROVIDER_SITE_OTHER): Payer: Self-pay | Admitting: Internal Medicine

## 2013-05-08 ENCOUNTER — Encounter: Payer: Self-pay | Admitting: Internal Medicine

## 2013-05-08 VITALS — BP 143/88 | HR 91 | Temp 96.7°F | Ht 63.0 in | Wt 165.5 lb

## 2013-05-08 DIAGNOSIS — E1129 Type 2 diabetes mellitus with other diabetic kidney complication: Secondary | ICD-10-CM

## 2013-05-08 DIAGNOSIS — I10 Essential (primary) hypertension: Secondary | ICD-10-CM

## 2013-05-08 DIAGNOSIS — N183 Chronic kidney disease, stage 3 unspecified: Secondary | ICD-10-CM

## 2013-05-08 DIAGNOSIS — R8271 Bacteriuria: Secondary | ICD-10-CM

## 2013-05-08 DIAGNOSIS — N390 Urinary tract infection, site not specified: Secondary | ICD-10-CM

## 2013-05-08 DIAGNOSIS — R8281 Pyuria: Secondary | ICD-10-CM

## 2013-05-08 LAB — MICROALBUMIN / CREATININE URINE RATIO
CREATININE, URINE: 19.3 mg/dL
Microalb Creat Ratio: 26.9 mg/g (ref 0.0–30.0)
Microalb, Ur: 0.52 mg/dL (ref 0.00–1.89)

## 2013-05-08 LAB — CBC WITH DIFFERENTIAL/PLATELET
Basophils Absolute: 0 10*3/uL (ref 0.0–0.1)
Basophils Relative: 0 % (ref 0–1)
Eosinophils Absolute: 0 10*3/uL (ref 0.0–0.7)
Eosinophils Relative: 0 % (ref 0–5)
HEMATOCRIT: 35.9 % — AB (ref 36.0–46.0)
HEMOGLOBIN: 12.5 g/dL (ref 12.0–15.0)
Lymphocytes Relative: 24 % (ref 12–46)
Lymphs Abs: 1.8 10*3/uL (ref 0.7–4.0)
MCH: 30.8 pg (ref 26.0–34.0)
MCHC: 34.8 g/dL (ref 30.0–36.0)
MCV: 88.4 fL (ref 78.0–100.0)
MONO ABS: 0.2 10*3/uL (ref 0.1–1.0)
MONOS PCT: 3 % (ref 3–12)
NEUTROS ABS: 5.3 10*3/uL (ref 1.7–7.7)
NEUTROS PCT: 73 % (ref 43–77)
Platelets: 202 10*3/uL (ref 150–400)
RBC: 4.06 MIL/uL (ref 3.87–5.11)
RDW: 12.2 % (ref 11.5–15.5)
WBC: 7.3 10*3/uL (ref 4.0–10.5)

## 2013-05-08 LAB — URINALYSIS, MICROSCOPIC ONLY
CASTS: NONE SEEN
CRYSTALS: NONE SEEN

## 2013-05-08 LAB — COMPLETE METABOLIC PANEL WITH GFR
ALT: 14 U/L (ref 0–35)
AST: 15 U/L (ref 0–37)
Albumin: 3.8 g/dL (ref 3.5–5.2)
Alkaline Phosphatase: 118 U/L — ABNORMAL HIGH (ref 39–117)
BILIRUBIN TOTAL: 0.5 mg/dL (ref 0.3–1.2)
BUN: 38 mg/dL — AB (ref 6–23)
CO2: 27 mEq/L (ref 19–32)
Calcium: 8.7 mg/dL (ref 8.4–10.5)
Chloride: 91 mEq/L — ABNORMAL LOW (ref 96–112)
Creat: 1.48 mg/dL — ABNORMAL HIGH (ref 0.50–1.10)
GFR, EST NON AFRICAN AMERICAN: 39 mL/min — AB
GFR, Est African American: 45 mL/min — ABNORMAL LOW
GLUCOSE: 338 mg/dL — AB (ref 70–99)
Potassium: 4.3 mEq/L (ref 3.5–5.3)
SODIUM: 132 meq/L — AB (ref 135–145)
Total Protein: 7.2 g/dL (ref 6.0–8.3)

## 2013-05-08 LAB — URINALYSIS, ROUTINE W REFLEX MICROSCOPIC
BILIRUBIN URINE: NEGATIVE
Glucose, UA: 500 mg/dL — AB
HGB URINE DIPSTICK: NEGATIVE
Ketones, ur: NEGATIVE mg/dL
NITRITE: NEGATIVE
Protein, ur: NEGATIVE mg/dL
Specific Gravity, Urine: 1.008 (ref 1.005–1.030)
Urobilinogen, UA: 0.2 mg/dL (ref 0.0–1.0)
pH: 6 (ref 5.0–8.0)

## 2013-05-08 LAB — POCT GLYCOSYLATED HEMOGLOBIN (HGB A1C): Hemoglobin A1C: 13.3

## 2013-05-08 LAB — LIPID PANEL
CHOLESTEROL: 179 mg/dL (ref 0–200)
HDL: 39 mg/dL — AB (ref >39)
LDL CALC: 72 mg/dL (ref 0–99)
TRIGLYCERIDES: 341 mg/dL — AB (ref <150)
Total CHOL/HDL Ratio: 4.6 Ratio
VLDL: 68 mg/dL — AB (ref 0–40)

## 2013-05-08 LAB — GLUCOSE, CAPILLARY: Glucose-Capillary: 309 mg/dL — ABNORMAL HIGH (ref 70–99)

## 2013-05-08 MED ORDER — BLOOD GLUCOSE METER KIT: PACK | Status: DC

## 2013-05-08 MED ORDER — CIPROFLOXACIN HCL 500 MG PO TABS: 500.0000 mg | ORAL_TABLET | Freq: Two times a day (BID) | ORAL | Status: DC

## 2013-05-08 MED ORDER — INSULIN DETEMIR 100 UNIT/ML FLEXPEN: 8.0000 [IU] | PEN_INJECTOR | Freq: Every day | SUBCUTANEOUS | Status: DC

## 2013-05-08 NOTE — Assessment & Plan Note (Addendum)
Lab Results  Component Value Date   HGBA1C 13.3 05/08/2013   HGBA1C 12.6 01/25/2013   HGBA1C 13.1 07/25/2012     Assessment: Diabetes control:  Deteriorated Progress toward A1C goal:   Not at goal Comments: She is on glipizide 19m BID ac and pioglitazone 156mdaily. Per her report, she eats a healthy diet with no sweets or fried foods. She is interested in starting insulin pens. Agreeable to seeing DoButch Pennylyler during this visit. She has a meter but does not have strips, has not checked her CBG in a while. Has had occasional polyuria but not recently, her daughter EmRaquel Sarnaas worried about her today and wanted her to come and get checked out.  Plan: Medications:  would like to start patient on insulin regimen given her worsening Hg A1C despite lifestyle modicfications. Discontinue glipizide and ACTOS Home glucose monitoring: Frequency:   Timing:   Instruction/counseling given: reminded to bring blood glucose meter & log to each visit, reminded to bring medications to each visit and discussed diet Educational resources provided:   Self management tools provided:   Other plans: Patient has met with DoDebera Latnd is agreeable to using insulin pens. Provided sample insulin pen today, pt in the process of applying to MAP and Or[employer]es[employer] with DeDillard'soday). [facility]mir [facility]. New meter Rx with strips/lancets. Pt instructed to check her CBG 3 times daily before breakfast, before lunch, and at bedtime. Pt to follow up in 2 weeks.

## 2013-05-08 NOTE — Progress Notes (Signed)
Diabetes Self-Management Education  Visit Number: Other (Comment) (annual follow up- insulin instruction)  05/08/2013 [name], identified by name and date of birth, is a 58 y.o. female with  .  Other people present during visit: daughter emily     ASSESSMENT  Patient Concerns:     Weight is 165# per pt and stable.  Lab Results: LDL Cholesterol  Date Value Ref Range Status  07/25/2012    Final           Hemoglobin A1C  Date Value Ref Range Status  05/08/2013 13.3   Final  07/20/2011 11.4* <5.7 % Final     (NOTE)                                                                              Family History  Problem Relation Age of Onset  . Stroke Father   . Heart attack Father     Had MI in his 17s  . Stomach cancer Paternal Grandmother   . Diabetes Maternal Grandmother   . Cerebral palsy Daughter   . Anesthesia problems Neg Hx   . Hypotension Neg Hx   . Malignant hyperthermia Neg Hx   . Pseudochol deficiency Neg Hx    History  Substance Use Topics  . Smoking status: Never Smoker   . Smokeless tobacco: Former Systems developer  . Alcohol Use: No    Support Systems:   family Special Needs:    simple instructions due to anxiety causing confusion Prior DM Education:  yes- here Daily Foot Exams:  unsure Patient Belief / Attitude about Diabetes:   good, accepting  Assessment comments: patient provided with sample of levemir pen given by physician and pen needles. She very successfully repeated deminstrations of how to use the pen then gave her self an injection with actual levemir pens ordered by physician of 8 units   Diet Recall: eats 3 meals a day no snacks, no regular soda, lunch today was a [employer] bagel with cream cheese   Individualized Plan for Diabetes Self-Management Training:  Patient individualized diabetes plan discussed today with patient and includes:   medications,  monitoring,  how to handle low blood sugars.   Education Topics Reviewed with Patient  Today:  Topic Points Discussed  Disease State    Nutrition Management    Physical Activity and Exercise    Medications Taught/evaluated insulin injection, site rotation, insulin storage and needle disposal.;Reviewed patients medication for diabetes, action, purpose, timing of dose and side effects.  Monitoring Purpose and frequency of SMBG.  Acute Complications Taught treatment of hypoglycemia - the 15 rule.  Chronic Complications    Psychosocial Adjustment    Goal Setting    Preconception Care (if applicable)      PATIENTS GOALS   Learning Objective(s):     Goal The patient agrees to:  Nutrition    Physical Activity    Medications take my medication as prescribed  Monitoring    Problem Solving    Reducing Risk    Health Coping     Patient Self-Evaluation of Goals (Subsequent Visits)  Goal The patient rates self as meeting goals (% of time)  Nutrition    Physical Activity  Medications    Monitoring    Problem Solving    Reducing Risk    Health Coping       PERSONALIZED PLAN / SUPPORT  Self-Management Support:  [place] office;Family;CDE visits  ______________________________________________________________________  Outcomes  Expected Outcomes:  Demonstrated interest in learning. Expect positive outcomes Self-Care Barriers:  Lack of material resources Education material provided: yes If problems or questions, patient to contact team via:  Phone Time in: 1530     Time out: 1600  Future DSME appointment: - 4-6 wks   Michele Kerlin, Butch Penny 05/08/2013 4:02 PM

## 2013-05-08 NOTE — Assessment & Plan Note (Addendum)
Metformin contraindicated for females with Cr >1.4, hers is very close at 1.37.  Pt needs continued monitoring or renal function with tight control of her DM2 and HTN.

## 2013-05-08 NOTE — Patient Instructions (Addendum)
General Instructions: -You have a bladder infection. Start taking ciprofloxacin 500mg  twice daily for 7 days.  -Check your blood sugar at least once per day in the mornings.  -Stop taking glipizide and pioglitazone.  -Bring the documents to [employer] for the [employer].  -Follow up with the clinic in two weeks.    Treatment Goals:  Goals (1 Years of Data) as of 05/08/13         As of Today 01/25/13 07/25/12 07/20/11     Result Component    . HEMOGLOBIN A1C < 7.0  13.3 12.6 13.1 11.4    . HEMOGLOBIN A1C < 7.0  13.3 12.6 13.1 11.4    . LDL CALC < 100         . LDL CALC < 100         . LDL CALC < 100           Progress Toward Treatment Goals:  Treatment Goal 01/25/2013  Hemoglobin A1C deteriorated  Blood pressure at goal    Self Care Goals & Plans:  Self Care Goal 05/08/2013  Manage my medications take my medicines as prescribed; bring my medications to every visit  Monitor my health check my feet daily; keep track of my blood pressure  Eat healthy foods eat more vegetables; eat foods that are low in salt  Be physically active take a walk every day  Meeting treatment goals -    Home Blood Glucose Monitoring 01/25/2013  Check my blood sugar no home glucose monitoring  When to check my blood sugar -     Care Management & Community Referrals:  Referral 01/25/2013  Referrals made for care management support diabetes educator

## 2013-05-08 NOTE — Assessment & Plan Note (Signed)
BP Readings from Last 3 Encounters:  05/08/13 143/88  01/27/13 116/69  01/25/13 132/75    Lab Results  Component Value Date   NA 134* 01/18/2013   K 4.4 01/18/2013   CREATININE 1.37* 01/18/2013    Assessment: Blood pressure control:  Controlled, mildly elevated initially as pt was stressed out but down to 127/85 with sitting Progress toward BP goal:   At goal Comments: She is on enalapril-HCTZ 5-12.5mg  daily and assures compliance  Plan: Medications:  continue current medications Educational resources provided:   Self management tools provided:   Other plans: Continue same medications with follow up in 3 months

## 2013-05-08 NOTE — Telephone Encounter (Signed)
Pt calls and states she would llike to know how one knows if they are dehydrated, she states her disabled daughter is worried that pt may be dehydrated, she states she thinks her blood sugar is really high but she has not been checking it because she cannot afford to do so, states she has refused to take insulin. She states she did not sleep well last night, she is voiding a lot more that normal and her face is flushed, states that she does not think that her BP is up. Her daughter is in the background speaking loudly to pt and seems very worried about pt not taking care. Pt is given appt this pm at 1430 w/ dr Hayes Ludwig

## 2013-05-08 NOTE — Assessment & Plan Note (Signed)
UA with pyuria bacteriauria.   -Will treat with Ciprofloxacin 500mg  BID for 7 days (complicated UTI given her DM2) which is renally dosed for a CrCl ~30ml/min  -will f/u on urine culture -Pt to follow up with the [facility] in 2 weeks

## 2013-05-08 NOTE — Patient Instructions (Signed)
Great job injecting yourself with 8 units of levemir today!!!  Please review the handout on low blood sugar we went over today.  They do not carry True track meter strips at your [facility].  Their least expensive strips are for a One touch ultra meter and they cost 36.99 plus tax for 25 strips Least expensive meters- [facility] Department meter is free, 50 strips for 6$, [facility] meter is 16$ and 50 strips is 9$ ( see handout)   Check you blood sugar every morning before you eat or drink anything. This should be around 100-130 or whatever your doctor tells you.    Insulin Storage and Care All insulin pens and bottles (vials) have expiration dates.  Refrigerated, unopened insulin pens, insulin cartridges, and insulin vials are good until the expiration date. Do not use insulin after this date.  Once insulin is opened, it should be used within 42 days. Opened means once the rubber is punctured. Always follow the instructions that come with your insulin. STORING AND CARING FOR YOUR INSULIN  Keep the insulin pen you are using at room temperature  less than 91F (30C).   Keep extra pens in the refrigerator. The temperature of the refrigerator must be between 34F and 75F (3C and 8C).  Do not freeze insulin.  Keep insulin away from direct heat or sunlight.  Throw away the insulin if it is discolored, thick, or has clumps or suspended white particles in it.  Always have extra insulin on hand.  Never leave insulin in your vehicle. Marland Kitchen

## 2013-05-08 NOTE — Progress Notes (Signed)
   Subjective:    Patient ID: [name], female    DOB: [birth year], 58 y.o.   MRN: [id number]  HPI [name] is a 58 yr old woman with PMH significant for CKD stage 3, HTN, HLP, uncontrolled DM2, and anxiety who comes in, accompanied by her daughter [name], for evaluation of her blood sugar and increased urinary frequency.  She states that she has not been checking her blood sugars at home but is afraid they have been high. She has had polyuria for months but more so in the past week. She denies fever/chills, dysuria, or flank pain.  She has met with Bonna Gains today and is very motivated to apply for the [employer] and the MAP program.   Review of Systems  Constitutional: Negative for fever, chills, diaphoresis, activity change, appetite change, fatigue and unexpected weight change.  Respiratory: Negative for cough, shortness of breath and wheezing.   Cardiovascular: Negative for chest pain, palpitations and leg swelling.  Gastrointestinal: Negative for nausea and abdominal pain.  Genitourinary: Positive for frequency. Negative for dysuria.  Musculoskeletal: Positive for back pain.  Neurological: Negative for dizziness, tremors, weakness, light-headedness and headaches.  Psychiatric/Behavioral: Negative for agitation.       Objective:   Physical Exam  Nursing note and vitals reviewed. Constitutional: She is oriented to person, place, and time. She appears well-nourished. No distress.  Cardiovascular: Normal rate and regular rhythm.   No murmur heard. Pulmonary/Chest: Effort normal and breath sounds normal. No respiratory distress. She has no wheezes. She has no rales.  Abdominal: Soft. There is no tenderness.  Genitourinary:  No suprapubic tenderness, no CVA tenderness  Musculoskeletal: She exhibits no edema and no tenderness.  Neurological: She is alert and oriented to person, place, and time.  Skin: Skin is warm and dry. She is not diaphoretic.  Psychiatric: She has a  normal mood and affect.          Assessment & Plan:

## 2013-05-09 ENCOUNTER — Other Ambulatory Visit: Payer: Self-pay | Admitting: Internal Medicine

## 2013-05-09 DIAGNOSIS — E119 Type 2 diabetes mellitus without complications: Secondary | ICD-10-CM

## 2013-05-09 LAB — URINE CULTURE
Colony Count: NO GROWTH
Organism ID, Bacteria: NO GROWTH

## 2013-05-09 NOTE — Progress Notes (Signed)
Case discussed with Dr. Kennerly soon after the resident saw the patient.  We reviewed the resident's history and exam and pertinent patient test results.  I agree with the assessment, diagnosis and plan of care documented in the resident's note. 

## 2013-05-22 ENCOUNTER — Encounter: Payer: Self-pay | Admitting: Internal Medicine

## 2013-05-22 ENCOUNTER — Ambulatory Visit (INDEPENDENT_AMBULATORY_CARE_PROVIDER_SITE_OTHER): Payer: Self-pay | Admitting: Internal Medicine

## 2013-05-22 VITALS — BP 117/74 | HR 82 | Temp 98.4°F | Wt 164.2 lb

## 2013-05-22 DIAGNOSIS — N390 Urinary tract infection, site not specified: Secondary | ICD-10-CM

## 2013-05-22 DIAGNOSIS — E1129 Type 2 diabetes mellitus with other diabetic kidney complication: Secondary | ICD-10-CM

## 2013-05-22 NOTE — Patient Instructions (Signed)
-  Start using Levemir 14 units daily.  -Check your blood sugar at least once per day, in the morning.  -Apply for the [facility] and the [facility] medication program (MAP) for discounted diabetic supplies and insulin.  -I have sent a prescription of the Levemir to the [facility] but you need to apply to MAP for a discounted price.  -Follow up in 2 weeks or sooner if you are able to get the [place] before then.   Please bring your medicines with you each time you come.   Medicines may be  Eye drops  Herbal   Vitamins  Pills  Seeing these help [place] take care of you.

## 2013-05-24 NOTE — Assessment & Plan Note (Signed)
She has no dysuria and no longer has increased urinary frequency. She finished her course of cipro.

## 2013-05-24 NOTE — Progress Notes (Signed)
   Subjective:    Patient ID: [name], female    DOB: [birth year], 58 y.o.   MRN: [id number]  Diabetes Pertinent negatives for hypoglycemia include no dizziness or headaches. Pertinent negatives for diabetes include no chest pain and no fatigue.   [name] is a 58 yr old woman with PMH of uncontrolled MD2, chronic low back pain, hyperlipidemia, and anxiety who presents for follow up for diabetes. She was started on insulin two weeks ago. She has not been checking her blood sugars every day for concern of running out of strips but has seen numbers in the low 200s in the morning. She denies tremors, headaches, dizziness, or other signs/symptoms of hypoglycemia.   She states that the tingling she used to get in her hands and feet are gone. She no longer her increased urinary frequency, she finished her course of ciprofloxacin.     Review of Systems  Constitutional: Negative for fever, chills, diaphoresis, activity change, appetite change and fatigue.  Cardiovascular: Negative for chest pain and leg swelling.  Gastrointestinal: Negative for abdominal pain.  Genitourinary: Negative for dysuria and frequency.  Musculoskeletal: Positive for back pain.  Neurological: Negative for dizziness, light-headedness and headaches.  Psychiatric/Behavioral: Negative for behavioral problems and agitation.       Objective:   Physical Exam  Nursing note and vitals reviewed. Constitutional: She is oriented to person, place, and time. She appears well-developed and well-nourished. No distress.  Cardiovascular: Normal rate.   Pulmonary/Chest: Effort normal. No respiratory distress.  Musculoskeletal: She exhibits no edema and no tenderness.  Neurological: She is alert and oriented to person, place, and time. Coordination normal.  Skin: Skin is warm and dry. She is not diaphoretic.  Psychiatric: She has a normal mood and affect.          Assessment & Plan:

## 2013-05-24 NOTE — Assessment & Plan Note (Signed)
Lab Results  Component Value Date   HGBA1C 13.3 05/08/2013   HGBA1C 12.6 01/25/2013   HGBA1C 13.1 07/25/2012     Assessment: Diabetes control:  Uncontrolled Progress toward A1C goal:   not acessed Comments: She was started on Levemir 8 units (at 4pm ) during her previous visits and has been compliant with this regimen.   Plan: Medications:  will increase Levemir to 14 units daily. Pt met with Debera Lat and practiced increase the insulin dose with an insulin pen. Pt states that she knows how to increase the dose in her own Levemir pen (she did not have her insulin pen or CBG meter with her during this visit).  Home glucose monitoring: Frequency:   Timing:   Instruction/counseling given: reminded to bring blood glucose meter & log to each visit and reminded to bring medications to each visit Educational resources provided:   Self management tools provided:   Other plans: Prescription of Levemir insulin pen was sent to the [facility]--she states that she will apply for MAP on 4/20. She received information in regards to a new meter with low cost strips--she will also follow up with the [facility] MAP for diabetic supplies. She has 14 days left for her insulin supply but states that she will let us know if she is unable to get insulin from the [facility] Dept.

## 2013-05-26 ENCOUNTER — Telehealth: Payer: Self-pay | Admitting: Internal Medicine

## 2013-05-26 ENCOUNTER — Emergency Department (INDEPENDENT_AMBULATORY_CARE_PROVIDER_SITE_OTHER)
Admission: EM | Admit: 2013-05-26 | Discharge: 2013-05-26 | Disposition: A | Discharge status: Home / Self Care | Payer: Self-pay | Source: Home / Self Care | Attending: Emergency Medicine | Admitting: Emergency Medicine

## 2013-05-26 ENCOUNTER — Encounter (HOSPITAL_COMMUNITY): Payer: Self-pay | Admitting: Emergency Medicine

## 2013-05-26 DIAGNOSIS — N390 Urinary tract infection, site not specified: Secondary | ICD-10-CM

## 2013-05-26 LAB — POCT URINALYSIS DIP (DEVICE)
Bilirubin Urine: NEGATIVE
GLUCOSE, UA: 100 mg/dL — AB
KETONES UR: NEGATIVE mg/dL
Nitrite: POSITIVE — AB
Protein, ur: NEGATIVE mg/dL
SPECIFIC GRAVITY, URINE: 1.015 (ref 1.005–1.030)
UROBILINOGEN UA: 0.2 mg/dL (ref 0.0–1.0)
pH: 5.5 (ref 5.0–8.0)

## 2013-05-26 LAB — GLUCOSE, CAPILLARY: GLUCOSE-CAPILLARY: 338 mg/dL — AB (ref 70–99)

## 2013-05-26 MED ORDER — CEPHALEXIN 500 MG PO CAPS: 500.0000 mg | ORAL_CAPSULE | Freq: Four times a day (QID) | ORAL | Status: DC

## 2013-05-26 NOTE — Discharge Instructions (Signed)
Finish all of the antibiotics even if you are feeling better.    Urinary Tract Infection A urinary tract infection (UTI) can occur any place along the urinary tract. The tract includes the kidneys, ureters, bladder, and urethra. A type of germ called bacteria often causes a UTI. UTIs are often helped with antibiotic medicine.  HOME CARE   If given, take antibiotics as told by your doctor. Finish them even if you start to feel better.  Drink enough fluids to keep your pee (urine) clear or pale yellow.  Avoid tea, drinks with caffeine, and bubbly (carbonated) drinks.  Pee often. Avoid holding your pee in for a long time.  Pee before and after having sex (intercourse).  Wipe from front to back after you poop (bowel movement) if you are a woman. Use each tissue only once. GET HELP RIGHT AWAY IF:   You have back pain.  You have lower belly (abdominal) pain.  You have chills.  You feel sick to your stomach (nauseous).  You throw up (vomit).  Your burning or discomfort with peeing does not go away.  You have a fever.  Your symptoms are not better in 3 days. MAKE SURE YOU:   Understand these instructions.  Will watch your condition.  Will get help right away if you are not doing well or get worse. Document Released: 07/13/2007 Document Revised: 10/19/2011 Document Reviewed: 08/25/2011 [facility] Patient Information 2014 [place], [place].

## 2013-05-26 NOTE — Telephone Encounter (Signed)
   Reason for call:   I received a call from [name] at 6:17pm PM indicating that she just left urgent care where she was diagnosed with a UTI and placed on Keflex.  She says she was previously treated with Cipro and wants that instead. She called back the office and was told keflex is more appropriate and that a urine cx was sent. She has already taken her first dose of keflex and was also wondering if she can take it with food or not.   She is upset that she just went to opc 4 days ago with similar complaints and was not tested for a UTI and not prescribed anything and now she has a UTI.    Pertinent Data:   Last seen in [name] by Dr. Hayes Burns on 05/22/13 for diabetes. UTI assessment notes no dysuria or frequency and finished course of cipro.   Went to urgent care today and seen by NP for back pain and hyperglycemia.  U/A positive for nitrite and trace leukocytes with 100 glucose. Prescribed Keflex 500mg  qid #40 and follow up with pcp advised for DM management  She says she is tired of [name], will try to work on her diabetes, and wants to look for a new doctor if she can afford it  She is asking if the medication can be taken with food   Assessment / Plan / Recommendations:  [name] is a 58 year old female with poorly controlled DM2, recent HbA1C 13.3 and CKD3, and HTN who was diagnosed with UTI at urgent care today and prescribed Keflex.   She wishes to switch to cipro since she used that last time but has already started her first dose of Keflex.   She was told by UC per the patient to continue the Keflex  I will forward this note to PCP and Dr. Hayes Burns (who last saw her in [place]) who can try calling her back on [phone] per her request to discuss change in antibiotics if deemed necessary  I also discussed with inpatient pharmacist [name], that Keflex can be taken with or without food, whatever is patient's preference.  I then called [name] back x2 to relay the information  but no answer. I left a voicemail telling her it was okay to take the medicine how she preferred.  She is encouraged to continue current antibiotics and discuss further with pcp.  As always, pt is advised that if symptoms worsen or new symptoms arise, they should go to an urgent care facility or to to ER for further evaluation.   Maria Pitch, MD   05/26/2013, 6:32 PM

## 2013-05-26 NOTE — ED Notes (Signed)
Pt comes in with c/o left side sudden back pain,non radiating that started this am. Denies n/v/ or fever S/p UTI with ATB last month CBG- 338 states she took 14 units Levemier @ 4pm [place],achy intermit pain noted

## 2013-05-26 NOTE — ED Notes (Signed)
Per pharmacist: pt states Keflex doesn't work for her & she is requesting Cipro.  Discussed with A. Jonah Blue, NP - stated she wants her on Keflex b/c she just finished a course of Cipro; a urine cx was sent, so if she needs something different, we will know that in a couple days and switch it if needed.  Pt informed of above discussion & verbalized understanding.

## 2013-05-26 NOTE — ED Provider Notes (Signed)
CSN: [id number]     Arrival date & time 05/26/13  1620 History   First MD Initiated Contact with Patient 05/26/13 1715     Chief Complaint  Patient presents with  . Back Pain  . Hyperglycemia   (Consider location/radiation/quality/duration/timing/severity/associated sxs/prior Treatment) HPI Comments: Recent uti within last month, finished all of cipro. No further uti sx until this morning. Thinks back pain that was completely resolved by tylenol could be either uti or chronic back pain. Worried about uti since blood sugars are so poorly controlled. Reviewed previous records- diabetes is very poorly controlled.   Patient is a 58 y.o. female presenting with back pain and hyperglycemia. The history is provided by the patient.  Back Pain Location:  Lumbar spine Quality:  Stabbing Radiates to:  Does not radiate Pain severity:  Moderate Pain is:  Same all the time Onset quality:  Sudden Duration: hours. Timing:  Constant Progression:  Resolved Chronicity: pt unsure if new like from uti or if is usual chronic back pain. Relieved by: tylenol. Worsened by:  Nothing tried Associated symptoms: no abdominal pain, no dysuria and no fever   Hyperglycemia Associated symptoms: no abdominal pain, no dysuria and no fever     Past Medical History  Diagnosis Date  . Diabetes mellitus type II, uncontrolled   . Hypertension   . Hyperlipidemia   . Ovarian cyst, left   . Anemia     due to menorrhagia, BL 8-10  . Vaginal cyst     nabothian and bartholin  . CKD (chronic kidney disease) stage 3, GFR 30-59 ml/min     baseline creatinine 1.4-1.7  . Anxiety   . Depression   . Postmenopausal bleeding 06/12/2008  . 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 UTI (lower urinary tract infection)   . IBS (irritable bowel syndrome)   . Chronic back pain   . Chronic abdominal pain   . DDD (degenerative disc disease), lumbar    Past Surgical History  Procedure Laterality Date  .  Cardiac surgery      to repair congenital defect as a child   Family History  Problem Relation Age of Onset  . Stroke Father   . Heart attack Father     Had MI in his 6s  . Stomach cancer Paternal Grandmother   . Diabetes Maternal Grandmother   . Cerebral palsy Daughter   . Anesthesia problems Neg Hx   . Hypotension Neg Hx   . Malignant hyperthermia Neg Hx   . Pseudochol deficiency Neg Hx    History  Substance Use Topics  . Smoking status: Never Smoker   . Smokeless tobacco: Former Systems developer  . Alcohol Use: No   OB History   Grav Para Term Preterm Abortions TAB SAB Ect Mult Living   3 1 1  2  2   1      Review of Systems  Constitutional: Negative for fever and chills.  Gastrointestinal: Negative for abdominal pain.  Genitourinary: Negative for dysuria and flank pain.  Musculoskeletal: Positive for back pain.    Allergies  Review of patient's allergies indicates no known allergies.  Home Medications   Prior to Admission medications   Medication Sig Start Date End Date Taking? Authorizing Provider  aspirin EC 81 MG tablet Take 81 mg by mouth daily.   Yes Historical Provider, MD  Enalapril-Hydrochlorothiazide 5-12.5 MG per tablet TAKE 1 TABLET BY MOUTH DAILY. 03/15/13  Yes Jenetta Downer, MD  Insulin  Detemir (LEVEMIR) 100 UNIT/ML Pen Inject 8 Units into the skin daily. ICD code 250.0, DM2 05/08/13  Yes Blain Pais, MD  simvastatin (ZOCOR) 40 MG tablet TAKE 1 TABLET (40 MG TOTAL) BY MOUTH AT BEDTIME. 01/27/13  Yes Ejiroghene Emokpae, MD  Blood Glucose Monitoring Suppl (BLOOD GLUCOSE METER) kit Check your blood sugar at least once per day, before breakfast. ICD code 250.0 05/08/13   Blain Pais, MD  cephALEXin (KEFLEX) 500 MG capsule Take 1 capsule (500 mg total) by mouth 4 (four) times daily. 05/26/13   Carvel Getting, NP  ciprofloxacin (CIPRO) 500 MG tablet Take 1 tablet (500 mg total) by mouth 2 (two) times daily. 05/08/13   Blain Pais, MD   There were no  vitals taken for this visit. Physical Exam  Constitutional: She appears well-developed and well-nourished. No distress.  Cardiovascular: Normal rate and regular rhythm.   Pulmonary/Chest: Effort normal and breath sounds normal.  Abdominal: Normal appearance and bowel sounds are normal. She exhibits no distension. There is no tenderness. There is no CVA tenderness.  Musculoskeletal:       Lumbar back: She exhibits normal range of motion, no tenderness, no bony tenderness and no deformity.    ED Course  Procedures (including critical care time) Labs Review Labs Reviewed  GLUCOSE, CAPILLARY - Abnormal; Notable for the following:    Glucose-Capillary 338 (*)    All other components within normal limits  POCT URINALYSIS DIP (DEVICE) - Abnormal; Notable for the following:    Glucose, UA 100 (*)    Hgb urine dipstick TRACE (*)    Nitrite POSITIVE (*)    Leukocytes, UA TRACE (*)    All other components within normal limits  URINE CULTURE    Results for orders placed during the hospital encounter of 05/26/13  GLUCOSE, CAPILLARY      Result Value Ref Range   Glucose-Capillary 338 (*) 70 - 99 mg/dL  POCT URINALYSIS DIP (DEVICE)      Result Value Ref Range   Glucose, UA 100 (*) NEGATIVE mg/dL   Bilirubin Urine NEGATIVE  NEGATIVE   Ketones, ur NEGATIVE  NEGATIVE mg/dL   Specific Gravity, Urine 1.015  1.005 - 1.030   Hgb urine dipstick TRACE (*) NEGATIVE   pH 5.5  5.0 - 8.0   Protein, ur NEGATIVE  NEGATIVE mg/dL   Urobilinogen, UA 0.2  0.0 - 1.0 mg/dL   Nitrite POSITIVE (*) NEGATIVE   Leukocytes, UA TRACE (*) NEGATIVE   Imaging Review No results found.   MDM   1. UTI (lower urinary tract infection)   rx keflex 532m QID #40. Pt to f/u with pcp and continue working on dm management at home.      ACarvel Getting NP 05/26/13 1721

## 2013-05-27 LAB — URINE CULTURE
COLONY COUNT: NO GROWTH
Culture: NO GROWTH

## 2013-05-27 NOTE — ED Provider Notes (Signed)
Medical screening examination/treatment/procedure(s) were performed by non-physician practitioner and as supervising physician I was immediately available for consultation/collaboration.  Philipp Deputy, M.D.  Harden Mo, MD 05/27/13 1130

## 2013-06-03 NOTE — Progress Notes (Signed)
Case discussed with Dr. Kennerly at the time of the visit.  We reviewed the resident's history and exam and pertinent patient test results.  I agree with the assessment, diagnosis, and plan of care documented in the resident's note. 

## 2013-06-04 ENCOUNTER — Other Ambulatory Visit: Payer: Self-pay | Admitting: Internal Medicine

## 2013-06-04 ENCOUNTER — Telehealth: Payer: Self-pay | Admitting: *Deleted

## 2013-06-04 NOTE — Telephone Encounter (Signed)
Pt called upset - has no money for insulin. Does not have [employer] -does not have all the paperwork for [employer]. Talked with Debera Lat and Dr Hayes Ludwig - called [place] - MAP is taking application from 3-1YH and again on Thursday. Pt wants insulin. Will talk to Illene Regulus and have him call pt about meds. Hilda Blades Pierra Skora RN 06/04/13 2PM

## 2013-06-05 ENCOUNTER — Other Ambulatory Visit: Payer: Self-pay | Admitting: *Deleted

## 2013-06-05 NOTE — Telephone Encounter (Signed)
Pt called for information on her insulin.  Refills are done. She is asking for # of [facility] to find out cost.  Number given

## 2013-06-06 ENCOUNTER — Other Ambulatory Visit: Payer: Self-pay | Admitting: Internal Medicine

## 2013-06-06 ENCOUNTER — Telehealth: Payer: Self-pay | Admitting: *Deleted

## 2013-06-06 DIAGNOSIS — E1129 Type 2 diabetes mellitus with other diabetic kidney complication: Secondary | ICD-10-CM

## 2013-06-06 MED ORDER — INSULIN DETEMIR 100 UNIT/ML FLEXPEN: 14.0000 [IU] | PEN_INJECTOR | Freq: Every day | SUBCUTANEOUS | Status: DC

## 2013-06-06 NOTE — Telephone Encounter (Signed)
Med list updated with levemir 14 units daily. Thanks! SK

## 2013-06-06 NOTE — Telephone Encounter (Signed)
[facility] calls to verify levemer dose, it was changed to 14 units 4/15 but med list not updated, verified w/ dr Hayes Ludwig, it is 14 units per dr Hayes Ludwig, hd, crystal informed. Med list will be updated

## 2013-06-12 ENCOUNTER — Emergency Department (INDEPENDENT_AMBULATORY_CARE_PROVIDER_SITE_OTHER)
Admission: EM | Admit: 2013-06-12 | Discharge: 2013-06-12 | Disposition: A | Discharge status: Home / Self Care | Payer: Self-pay | Source: Home / Self Care | Attending: Emergency Medicine | Admitting: Emergency Medicine

## 2013-06-12 ENCOUNTER — Encounter (HOSPITAL_COMMUNITY): Payer: Self-pay | Admitting: Emergency Medicine

## 2013-06-12 DIAGNOSIS — M545 Low back pain, unspecified: Secondary | ICD-10-CM

## 2013-06-12 LAB — POCT URINALYSIS DIP (DEVICE)
BILIRUBIN URINE: NEGATIVE
GLUCOSE, UA: 100 mg/dL — AB
HGB URINE DIPSTICK: NEGATIVE
Ketones, ur: NEGATIVE mg/dL
NITRITE: NEGATIVE
Protein, ur: NEGATIVE mg/dL
SPECIFIC GRAVITY, URINE: 1.015 (ref 1.005–1.030)
Urobilinogen, UA: 0.2 mg/dL (ref 0.0–1.0)
pH: 5.5 (ref 5.0–8.0)

## 2013-06-12 NOTE — ED Notes (Signed)
C/o  Lower back pain that radiates around to right lower pelvic area since yesterday.  Hx of recurrent uti's.   States recently finished antibiotic for a uti

## 2013-06-12 NOTE — Discharge Instructions (Signed)
Do exercises twice daily followed by moist heat for 15 minutes. ° ° ° ° ° °Try to be as active as possible. ° °If no better in 2 weeks, follow up with orthopedist. ° ° °

## 2013-06-12 NOTE — ED Provider Notes (Signed)
Chief Complaint   Chief Complaint  Patient presents with  . Back Pain    History of Present Illness   [name] is a 58 year old female who comes in today because of lower back pain. Her main reason for coming in was just to make sure she did not have a urinary tract infection. The back pain has been going on for a month. She's been seen twice, once here and once at the emergency room. Her urine at both times showed some minor changes on dipstick. She was given antibiotics both times, both times her urine cultures were negative. The patient states she's had mild back pain off-and-on for the past week although she is not in any pain right now. The pain does not radiate into the legs and there is no numbness, tingling, or weakness. She denies any bladder or bowel dysfunction or saddle anesthesia. The patient states she had an x-ray of her back about 6 months ago which showed either arthritis or degenerative disc disease, she does not know which. She's also had some intermittent pain in her right flank area but does not have any today. She denies any fever, chills, nausea, vomiting, or intent weight loss. She denies any injury to the back.  Review of Systems   Other than as noted above, the patient denies any of the following symptoms: Systemic:  No fever, chills, or unexplained weight loss. GI:  No abdominal painor incontinence of bowel. GU:  No dysuria, frequency, urgency, or hematuria. No incontinence of urine or urinary retention.  M-S:  No neck pain or arthritis. Neuro:  No paresthesias, headache, saddle anesthesia, muscular weakness, or progressive neurological deficit.  Wise   Past medical history, family history, social history, meds, and allergies were reviewed. Specifically, there is no history of cancer, major trauma, osteoporosis, immunosuppression, or HIV infection. Current meds include aspirin, enalapril/hydrochlorothiazide, glipizide, Levemir insulin, and Zocor. She has  diabetes, hypertension, and chronic kidney disease.  Physical Examination    Vital signs:  BP 133/80  Pulse 83  Temp(Src) 98.6 F (37 C) (Oral)  Resp 16  SpO2 100% General:  Alert, oriented, in no distress. Abdomen:  Soft, non-tender.  No organomegaly or mass.  No pulsatile midline abdominal mass or bruit. Back:  Her back was nontender today and has a full range of motion with no pain. Straight leg raising was negative. Neuro:  Normal muscle strength, sensations and DTRs. Extremities: Pedal pulses were full, there was no edema. Skin:  Clear, warm and dry.  No rash.  Labs   Results for orders placed during the hospital encounter of 06/12/13  POCT URINALYSIS DIP (DEVICE)      Result Value Ref Range   Glucose, UA 100 (*) NEGATIVE mg/dL   Bilirubin Urine NEGATIVE  NEGATIVE   Ketones, ur NEGATIVE  NEGATIVE mg/dL   Specific Gravity, Urine 1.015  1.005 - 1.030   Hgb urine dipstick NEGATIVE  NEGATIVE   pH 5.5  5.0 - 8.0   Protein, ur NEGATIVE  NEGATIVE mg/dL   Urobilinogen, UA 0.2  0.0 - 1.0 mg/dL   Nitrite NEGATIVE  NEGATIVE   Leukocytes, UA SMALL (*) NEGATIVE   Urine was cultured again.   Assessment   The encounter diagnosis was Low back pain.  Lumbago, possibly due to degenerative disc disease or arthritis. No evidence of kidney infection.  Plan     1.  Meds:  The following meds were prescribed:   New Prescriptions   No medications on file  The patient does not want any pain medications and stated that she would just take Tylenol.  2.  Patient Education/Counseling:  The patient was given appropriate handouts, self care instructions, and instructed in symptomatic relief. The patient was encouraged to try to be as active as possible and given some exercises to do followed by moist heat.  3.  Follow up:  The patient was told to follow up here if no better in 3 to 4 days, or sooner if becoming worse in any way, and given some red flag symptoms such as worsening pain or new  neurological symptoms which would prompt immediate return.  Follow up here if needed.     Harden Mo, MD 06/12/13 [phone]

## 2013-06-13 LAB — URINE CULTURE
Colony Count: 45000
Special Requests: NORMAL

## 2013-06-14 ENCOUNTER — Other Ambulatory Visit: Payer: Self-pay | Admitting: Internal Medicine

## 2013-07-25 ENCOUNTER — Other Ambulatory Visit: Payer: Self-pay | Admitting: Internal Medicine

## 2013-07-30 ENCOUNTER — Encounter: Payer: Self-pay | Admitting: Internal Medicine

## 2013-08-05 ENCOUNTER — Encounter: Payer: Self-pay | Admitting: Internal Medicine

## 2013-08-07 ENCOUNTER — Other Ambulatory Visit: Payer: Self-pay | Admitting: Internal Medicine

## 2013-08-08 NOTE — Telephone Encounter (Signed)
Message sent to front desk pool for appt. 

## 2013-08-10 ENCOUNTER — Telehealth: Payer: Self-pay | Admitting: Internal Medicine

## 2013-08-10 MED ORDER — ENALAPRIL-HYDROCHLOROTHIAZIDE 5-12.5 MG PO TABS: 1.0000 | ORAL_TABLET | Freq: Every day | ORAL | Status: DC

## 2013-08-10 NOTE — Telephone Encounter (Signed)
   Reason for call:   I received a call from [name] at 11  AM indicating that she was unable to get her BP meds.   Pertinent Data:   Reviewed her chart and found pt on enalapril-HCTZ 5-12.5   Assessment / Plan / Recommendations:   Pt was informed refill sent to her pharmacy and personal call was made to [place] where confirmation of prescription was made   As always, pt is advised that if symptoms worsen or new symptoms arise, they should go to an urgent care facility or to to ER for further evaluation.   Clinton Gallant, MD   08/10/2013, 11:39 AM

## 2013-08-23 ENCOUNTER — Encounter: Payer: Self-pay | Admitting: Internal Medicine

## 2013-08-26 ENCOUNTER — Other Ambulatory Visit: Payer: Self-pay | Admitting: Internal Medicine

## 2013-08-27 NOTE — Telephone Encounter (Signed)
Have attempted to call pt to discuss current meds, unable to speak to or leave message

## 2013-09-06 ENCOUNTER — Other Ambulatory Visit: Payer: Self-pay | Admitting: Internal Medicine

## 2013-09-10 ENCOUNTER — Ambulatory Visit (INDEPENDENT_AMBULATORY_CARE_PROVIDER_SITE_OTHER): Payer: Self-pay | Admitting: Internal Medicine

## 2013-09-10 ENCOUNTER — Encounter: Payer: Self-pay | Admitting: Internal Medicine

## 2013-09-10 VITALS — BP 138/84 | HR 100 | Temp 98.3°F | Ht 64.0 in | Wt 165.7 lb

## 2013-09-10 DIAGNOSIS — I10 Essential (primary) hypertension: Secondary | ICD-10-CM

## 2013-09-10 DIAGNOSIS — E785 Hyperlipidemia, unspecified: Secondary | ICD-10-CM

## 2013-09-10 DIAGNOSIS — N058 Unspecified nephritic syndrome with other morphologic changes: Secondary | ICD-10-CM

## 2013-09-10 DIAGNOSIS — E0922 Drug or chemical induced diabetes mellitus with diabetic chronic kidney disease: Secondary | ICD-10-CM

## 2013-09-10 DIAGNOSIS — E1329 Other specified diabetes mellitus with other diabetic kidney complication: Secondary | ICD-10-CM

## 2013-09-10 LAB — GLUCOSE, CAPILLARY: Glucose-Capillary: 371 mg/dL — ABNORMAL HIGH (ref 70–99)

## 2013-09-10 LAB — POCT GLYCOSYLATED HEMOGLOBIN (HGB A1C): Hemoglobin A1C: 8.8

## 2013-09-10 MED ORDER — METFORMIN HCL 500 MG PO TABS: 500.0000 mg | ORAL_TABLET | Freq: Every day | ORAL | Status: DC

## 2013-09-10 MED ORDER — OMEGA 3 1000 MG PO CAPS: 1000.0000 mg | ORAL_CAPSULE | Freq: Two times a day (BID) | ORAL | Status: DC

## 2013-09-10 NOTE — Assessment & Plan Note (Addendum)
Last LDL- 72- 05/2013. Complaint with simvastatin 40mg  daily. TG elevated at 341.   Plan- Cont simvastatin- 40mg  daily. - Pt having financial issues. Will call in prescription for fish oil, if pt can afford it.

## 2013-09-10 NOTE — Progress Notes (Signed)
Patient ID: [name], female   DOB: [birth year], 58 y.o.   MRN: [id number]   Subjective:   Patient ID: [name] female   DOB: July 23, 1955 58 y.o.   MRN: [id number]  HPI: [name] is a 58 y.o. with PMH of HTN, DM, CKD- 3, financial difficulty, HLD,presented today for routine follow up. Please see assessment and plan for review of chronic medical conditions.    Past Medical History  Diagnosis Date  . Diabetes mellitus type II, uncontrolled   . Hypertension   . Hyperlipidemia   . Ovarian cyst, left   . Anemia     due to menorrhagia, BL 8-10  . Vaginal cyst     nabothian and bartholin  . CKD (chronic kidney disease) stage 3, GFR 30-59 ml/min     baseline creatinine 1.4-1.7  . Anxiety   . Depression   . Postmenopausal bleeding 06/12/2008  . 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 UTI (lower urinary tract infection)   . IBS (irritable bowel syndrome)   . Chronic back pain   . Chronic abdominal pain   . DDD (degenerative disc disease), lumbar    Current Outpatient Prescriptions  Medication Sig Dispense Refill  . aspirin EC 81 MG tablet Take 81 mg by mouth daily.      . Blood Glucose Monitoring Suppl (BLOOD GLUCOSE METER) kit Check your blood sugar at least once per day, before breakfast. ICD code 250.0  1 each  0  . cephALEXin (KEFLEX) 500 MG capsule Take 1 capsule (500 mg total) by mouth 4 (four) times daily.  40 capsule  0  . ciprofloxacin (CIPRO) 500 MG tablet Take 1 tablet (500 mg total) by mouth 2 (two) times daily.  14 tablet  0  . Enalapril-Hydrochlorothiazide 5-12.5 MG per tablet Take 1 tablet by mouth daily.  30 tablet  1  . Insulin Detemir (LEVEMIR) 100 UNIT/ML Pen Inject 14 Units into the skin daily. ICD code 250.0, DM2  15 mL  11  . metFORMIN (GLUCOPHAGE) 500 MG tablet Take 1 tablet (500 mg total) by mouth daily with breakfast.  30 tablet  3  . simvastatin (ZOCOR) 40 MG tablet TAKE 1 TABLET (40 MG TOTAL) BY MOUTH AT BEDTIME.  30 tablet   6  . [DISCONTINUED] pantoprazole (PROTONIX) 20 MG tablet Take 2 tablets (40 mg total) by mouth daily.  30 tablet  1  . [DISCONTINUED] sertraline (ZOLOFT) 100 MG tablet Take 1 tablet (100 mg total) by mouth daily.  30 tablet  2   No current facility-administered medications for this visit.   Family History  Problem Relation Age of Onset  . Stroke Father   . Heart attack Father     Had MI in his 100s  . Stomach cancer Paternal Grandmother   . Diabetes Maternal Grandmother   . Cerebral palsy Daughter   . Anesthesia problems Neg Hx   . Hypotension Neg Hx   . Malignant hyperthermia Neg Hx   . Pseudochol deficiency Neg Hx    History   Social History  . Marital Status: Divorced    Spouse Name: N/A    Number of Children: 1  . Years of Education: 12th grade   Occupational History  . 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 Smoking status: Never Smoker   . Smokeless tobacco: Former Systems developer  . Alcohol Use: No  . Drug Use: No  .  Sexual Activity: None   Other Topics Concern  . 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   Review of Systems: CONSTITUTIONAL- No Fever, weightloss, night sweat or change in appetite. SKIN- No Rash, colour changes or itching. HEAD- No Headache or dizziness. RESPIRATORY- No Cough or SOB. CARDIAC- No Palpitations, DOE, PND or chest pain. GI- No nausea, vomiting, diarrhoea, constipation, abd pain. URINARY- Frequency- wakes up several times at night, but no urgency, straining or dysuria. HAs been treated for UTI. NEUROLOGIC- No Numbness, syncope, seizures or burning.   Objective:  Physical Exam: Filed Vitals:   09/10/13 1454  BP: 138/84  Pulse: 100  Temp: 98.3 F (36.8 C)  TempSrc: Oral  Height: 5' 4"  (1.626 m)  Weight: 165 lb 11.2 oz (75.161 kg)  SpO2: 97%   GENERAL- alert, co-operative, appears as stated age, not in any distress. HEENT- Atraumatic, normocephalic, PERRL, EOMI,  oral mucosa appears moist, neck supple. CARDIAC- RRR, 3/6 systolic murmur, pt says she has been told this before, rubs or gallops. RESP- Moving equal volumes of air, and clear to auscultation bilaterally, no wheezes or crackles. ABDOMEN- Soft, nontender, no guarding or rebound, no palpable masses or organomegaly, bowel sounds present. BACK- Kyphosis present, No tenderness along the vertebrae, no CVA tenderness. NEURO- No obvious Cr N abnormality, strenght upper and lower extremities- 5/5, Gait- Normal. EXTREMITIES- pulse 2+, symmetric, trace piiting pedal edema. SKIN- Warm, dry, No rash or lesion. PSYCH- Normal mood and affect, appropriate thought content and speech.  Assessment & Plan:   The patient's case and plan of care was discussed with attending physician, Dr. Orie Fisherman.  Please see problem based charting for assessment and plan.

## 2013-09-10 NOTE — Patient Instructions (Signed)
General Instructions: Congratulations on your blood glucose control. It is significantly better today but you still have a ways to go. Please work on your diet and do exercise everyday. Also we want you to meet with Butch Penny- our Nutritionist, so you can get glucometer strips and at least check your blood sugars at least once a day.   Also continue taking your medications as prescribed.  We will be stopping your glipizide and starting you on Metformin- 500mg  once a day.   Also please complete the paper work for your orange card so you can get all your health screenings done- Colonoscopy, Pap smear, mammogram, foot and eye exam. This will also help with affording your medications.  Please bring your medicines with you each time you come to clinic.  Medicines may include prescription medications, over-the-counter medications, herbal remedies, eye drops, vitamins, or other pills.

## 2013-09-10 NOTE — Assessment & Plan Note (Addendum)
BP Readings from Last 3 Encounters:  09/10/13 138/84  06/12/13 133/80  05/22/13 117/74    Lab Results  Component Value Date   NA 132* 05/08/2013   K 4.3 05/08/2013   CREATININE 1.48* 05/08/2013    Assessment: Blood pressure control:  Controlled Progress toward BP goal:   At goal Comments: Complaint with meds, never smoked cigs.  Plan: Medications:  Cont Enalapril-HCTZ- 5- 12.5mg  Daily Other plans: None

## 2013-09-10 NOTE — Assessment & Plan Note (Signed)
Lab Results  Component Value Date   HGBA1C 8.8 09/10/2013   HGBA1C 13.3 05/08/2013   HGBA1C 12.6 01/25/2013     Assessment: Diabetes control:  Uncontrolled Progress toward A1C goal:   Significant Improvement Comments: Complaint with Levemir- 14u daily, has a glucometer without strips, due to financial issues- and so has not been checking her blood sugars. Not complaint with diet, says she ate a pizza this afternoon, but promises to do better. Says she knows what to do about her diet. Weight up 7 Lbs since December.  Plan: Medications:  Continue levemir- 14u daily, told pt he glipizide will be discontinued and we will start metformin- 500mg  daily. Will increase as pt tolerates, but hopefully with glucometer readings next time.  Home glucose monitoring: Frequency:  Once a day Timing:  Morning Instruction/counseling given: reminded to get eye exam, reminded to bring blood glucose meter & log to each visit, reminded to bring medications to each visit, discussed foot care, discussed the need for weight loss and discussed diet Other plans: refferal to Butch Penny, so pt can get the cheapest supply of strips. Encouraged about exercise and diet. Also congratulated on improved HgbA1c. Devoted time to counsel pt about the complications of uncontrolled blood sugars. Also on the need to have a glucometer.

## 2013-09-11 NOTE — Progress Notes (Signed)
INTERNAL MEDICINE TEACHING ATTENDING ADDENDUM - Delores Thelen, MD: I reviewed and discussed at the time of visit with the resident Dr. Emokpae, the patient's medical history, physical examination, diagnosis and results of pertinent tests and treatment and I agree with the patient's care as documented.  

## 2013-09-12 ENCOUNTER — Emergency Department (HOSPITAL_COMMUNITY)
Admission: EM | Admit: 2013-09-12 | Discharge: 2013-09-12 | Disposition: A | Discharge status: Home / Self Care | Payer: Self-pay | Attending: Emergency Medicine | Admitting: Emergency Medicine

## 2013-09-12 ENCOUNTER — Encounter (HOSPITAL_COMMUNITY): Payer: Self-pay | Admitting: Emergency Medicine

## 2013-09-12 DIAGNOSIS — M5137 Other intervertebral disc degeneration, lumbosacral region: Secondary | ICD-10-CM | POA: Insufficient documentation

## 2013-09-12 DIAGNOSIS — Z87440 Personal history of urinary (tract) infections: Secondary | ICD-10-CM | POA: Insufficient documentation

## 2013-09-12 DIAGNOSIS — R209 Unspecified disturbances of skin sensation: Secondary | ICD-10-CM | POA: Insufficient documentation

## 2013-09-12 DIAGNOSIS — I129 Hypertensive chronic kidney disease with stage 1 through stage 4 chronic kidney disease, or unspecified chronic kidney disease: Secondary | ICD-10-CM | POA: Insufficient documentation

## 2013-09-12 DIAGNOSIS — Z794 Long term (current) use of insulin: Secondary | ICD-10-CM | POA: Insufficient documentation

## 2013-09-12 DIAGNOSIS — E119 Type 2 diabetes mellitus without complications: Secondary | ICD-10-CM | POA: Insufficient documentation

## 2013-09-12 DIAGNOSIS — N183 Chronic kidney disease, stage 3 unspecified: Secondary | ICD-10-CM | POA: Insufficient documentation

## 2013-09-12 DIAGNOSIS — Z8774 Personal history of (corrected) congenital malformations of heart and circulatory system: Secondary | ICD-10-CM | POA: Insufficient documentation

## 2013-09-12 DIAGNOSIS — E785 Hyperlipidemia, unspecified: Secondary | ICD-10-CM | POA: Insufficient documentation

## 2013-09-12 DIAGNOSIS — Z8659 Personal history of other mental and behavioral disorders: Secondary | ICD-10-CM | POA: Insufficient documentation

## 2013-09-12 DIAGNOSIS — M51379 Other intervertebral disc degeneration, lumbosacral region without mention of lumbar back pain or lower extremity pain: Secondary | ICD-10-CM | POA: Insufficient documentation

## 2013-09-12 DIAGNOSIS — G609 Hereditary and idiopathic neuropathy, unspecified: Secondary | ICD-10-CM | POA: Insufficient documentation

## 2013-09-12 DIAGNOSIS — F411 Generalized anxiety disorder: Secondary | ICD-10-CM | POA: Insufficient documentation

## 2013-09-12 DIAGNOSIS — Z79899 Other long term (current) drug therapy: Secondary | ICD-10-CM | POA: Insufficient documentation

## 2013-09-12 DIAGNOSIS — N189 Chronic kidney disease, unspecified: Secondary | ICD-10-CM

## 2013-09-12 DIAGNOSIS — Z7982 Long term (current) use of aspirin: Secondary | ICD-10-CM | POA: Insufficient documentation

## 2013-09-12 DIAGNOSIS — Z862 Personal history of diseases of the blood and blood-forming organs and certain disorders involving the immune mechanism: Secondary | ICD-10-CM | POA: Insufficient documentation

## 2013-09-12 DIAGNOSIS — Z792 Long term (current) use of antibiotics: Secondary | ICD-10-CM | POA: Insufficient documentation

## 2013-09-12 DIAGNOSIS — Z8719 Personal history of other diseases of the digestive system: Secondary | ICD-10-CM | POA: Insufficient documentation

## 2013-09-12 DIAGNOSIS — G629 Polyneuropathy, unspecified: Secondary | ICD-10-CM

## 2013-09-12 DIAGNOSIS — G8929 Other chronic pain: Secondary | ICD-10-CM | POA: Insufficient documentation

## 2013-09-12 DIAGNOSIS — R739 Hyperglycemia, unspecified: Secondary | ICD-10-CM

## 2013-09-12 LAB — I-STAT CHEM 8, ED
BUN: 40 mg/dL — ABNORMAL HIGH (ref 6–23)
CREATININE: 1.8 mg/dL — AB (ref 0.50–1.10)
Calcium, Ion: 1.21 mmol/L (ref 1.12–1.23)
Chloride: 93 mEq/L — ABNORMAL LOW (ref 96–112)
Glucose, Bld: 410 mg/dL — ABNORMAL HIGH (ref 70–99)
HCT: 37 % (ref 36.0–46.0)
HEMOGLOBIN: 12.6 g/dL (ref 12.0–15.0)
Potassium: 4.3 mEq/L (ref 3.7–5.3)
Sodium: 133 mEq/L — ABNORMAL LOW (ref 137–147)
TCO2: 27 mmol/L (ref 0–100)

## 2013-09-12 NOTE — ED Notes (Signed)
Pt expressing concern about outpatient clinic care. Requesting blood work and A1C.

## 2013-09-12 NOTE — ED Provider Notes (Signed)
Medical screening examination/treatment/procedure(s) were performed by non-physician practitioner and as supervising physician I was immediately available for consultation/collaboration.   EKG Interpretation None        Francine Graven, DO 09/12/13 2356

## 2013-09-12 NOTE — ED Notes (Addendum)
States bilat feet numbness-- was seen at doctor Tuesday for same, but they did not check her feet. Pt states "I do not want to sit for two hours". Daughter with CP with mother-- talking back and forth, arguing about medical treatment- Mother states "I quit work to take care of you, so hush"  States the Outpatient Clinic told her not to come back after Tuesday. States "I am not bringing my Lucita Lora' here ever because they didn't take care of her"

## 2013-09-12 NOTE — ED Notes (Signed)
Declined W/C at D/C and was escorted to lobby by RN. 

## 2013-09-12 NOTE — Discharge Instructions (Signed)
Chronic Kidney Disease °Chronic kidney disease occurs when the kidneys are damaged over a long period. The kidneys are two organs that lie on either side of the spine between the middle of the back and the front of the abdomen. The kidneys:  °· Remove wastes and extra water from the blood.   °· Produce important hormones. These help keep bones strong, regulate blood pressure, and help create red blood cells.   °· Balance the fluids and chemicals in the blood and tissues. °A small amount of kidney damage may not cause problems, but a large amount of damage may make it difficult or impossible for the kidneys to work the way they should. If steps are not taken to slow down the kidney damage or stop it from getting worse, the kidneys may stop working permanently. Most of the time, chronic kidney disease does not go away. However, it can often be controlled, and those with the disease can usually live normal lives. °CAUSES  °The most common causes of chronic kidney disease are diabetes and high blood pressure (hypertension). Chronic kidney disease may also be caused by:  °· Diseases that cause the kidneys' filters to become inflamed.   °· Diseases that affect the immune system.   °· Genetic diseases.   °· Medicines that damage the kidneys, such as anti-inflammatory medicines.   °· Poisoning or exposure to toxic substances.   °· A reoccurring kidney or urinary infection.   °· A problem with urine flow. This may be caused by:   °¨ Cancer.   °¨ Kidney stones.   °¨ An enlarged prostate in males. °SIGNS AND SYMPTOMS  °Because the kidney damage in chronic kidney disease occurs slowly, symptoms develop slowly and may not be obvious until the kidney damage becomes severe. A person may have a kidney disease for years without showing any symptoms. Symptoms can include:  °· Swelling (edema) of the legs, ankles, or feet.   °· Tiredness (lethargy).   °· Nausea or vomiting.   °· Confusion.   °· Problems with urination, such as:    °¨ Decreased urine production.   °¨ Frequent urination, especially at night.   °¨ Frequent accidents in children who are potty trained.   °· Muscle twitches and cramps.   °· Shortness of breath.  °· Weakness.   °· Persistent itchiness.   °· Loss of appetite. °· Metallic taste in the mouth. °· Trouble sleeping. °· Slowed development in children. °· Short stature in children. °DIAGNOSIS  °Chronic kidney disease may be detected and diagnosed by tests, including blood, urine, imaging, or kidney biopsy tests.  °TREATMENT  °Most chronic kidney diseases cannot be cured. Treatment usually involves relieving symptoms and preventing or slowing the progression of the disease. Treatment may include:  °· A special diet. You may need to avoid alcohol and foods that are salty and high in potassium.   °· Medicines. These may:   °¨ Lower blood pressure.   °¨ Relieve anemia.   °¨ Relieve swelling.   °¨ Protect the bones. °HOME CARE INSTRUCTIONS  °· Follow your prescribed diet.   °· Take medicines only as directed by your health care provider. Do not take any new medicines (prescription, over-the-counter, or nutritional supplements) unless approved by your health care provider. Many medicines can worsen your kidney damage or need to have the dose adjusted.   °· Quit smoking if you smoke. Talk to your health care provider about a smoking cessation program.   °· Keep all follow-up visits as directed by your health care provider. °SEEK IMMEDIATE MEDICAL CARE IF: °· Your symptoms get worse or you develop new symptoms.   °· You develop symptoms of end-stage kidney disease. These   include:   Headaches.   Abnormally dark or light skin.   Numbness in the hands or feet.   Easy bruising.   Frequent hiccups.   Menstruation stops.   You have a fever.   You have decreased urine production.   You havepain or bleeding when urinating. MAKE SURE YOU:  Understand these instructions.  Will watch your condition.  Will  get help right away if you are not doing well or get worse. FOR MORE INFORMATION   American Association of Kidney Patients: BombTimer.gl  National Kidney Foundation: www.kidney.[place]: https://mathis.com/  Life Options Rehabilitation Program: www.lifeoptions.org and www.kidneyschool.org Document Released: 11/03/2007 Document Revised: 06/10/2013 Document Reviewed: 09/23/2011 [facility] Patient Information 2015 [place], [place]. This information is not intended to replace advice given to you by your health care provider. Make sure you discuss any questions you have with your health care provider.  Emergency Department Resource Guide 1) Find a Doctor and Pay Out of Pocket Although you won't have to find out who is covered by your insurance plan, it is a good idea to ask around and get recommendations. You will then need to call the office and see if the doctor you have chosen will accept you as a new patient and what types of options they offer for patients who are self-pay. Some doctors offer discounts or will set up payment plans for their patients who do not have insurance, but you will need to ask so you aren't surprised when you get to your appointment.  2) Contact Your Local Health Department Not all health departments have doctors that can see patients for sick visits, but many do, so it is worth a call to see if yours does. If you don't know where your local health department is, you can check in your phone book. The CDC also has a tool to help you locate your state's health department, and many state websites also have listings of all of their local health departments.  3) Find a [facility] If your illness is not likely to be very severe or complicated, you may want to try a walk in clinic. These are popping up all over the country in pharmacies, drugstores, and shopping centers. They're usually staffed by nurse practitioners or physician assistants that have been trained to  treat common illnesses and complaints. They're usually fairly quick and inexpensive. However, if you have serious medical issues or chronic medical problems, these are probably not your best option.  No Primary Care Doctor: - Call Health Connect at  [phone] - they can help you locate a primary care doctor that  accepts your insurance, provides certain services, etc. - Physician Referral Service- [phone]  Chronic Pain Problems: Organization         Address  Phone   Notes  [facility]  [phone] Patients need to be referred by their primary care doctor.   Medication Assistance: Organization         Address  Phone   Notes  [facility] Medication [facility] [address] [place], [place] [address] [phone] --Must be a resident of [facility] -- Must have NO insurance coverage whatsoever (no Medicaid/ Medicare, etc.) -- The pt. MUST have a primary care doctor that directs their care regularly and follows them in the community   [facility]  [phone]   [employer]  [phone]    Agencies that provide inexpensive medical care: Organization  Address  Phone   Notes  [place]  [phone]   [facility]    [phone]   [facility] [address], [place] [address] [phone]   [place] [address], [place] [phone]   [facility]anned Parenthood    [phone]   [facility]    [phone]   [place] and [address], [place] Phone:  [phone], Fax:  [phone] Hours of Operation:  9 am - 6 pm, M-F.  Also accepts Medicaid/Medicare and self-pay.  [facility] for [place] [address], [address], [place] Phone: [phone], Fax: [phone]. Hours of Operation:  8:30 am - 5:30 pm, M-F.  Also accepts Medicaid and self-pay.  [facility]  [place] [address], [place] Phone: [phone]   [place], [place], [place] [phone], Ext. [phone] Mondays & Thursdays: 7-9 AM.  First 15 patients are seen on a first come, first serve basis.    [place] Providers:  Organization         Address  Phone   Notes  [facility] [address], [place] [phone] Also accepts self-pay patients.  [facility] [address], [place]  [phone]   [place], [address], [place] [phone]   [facility] [address], [place] [phone]   [facility]ianne Lei [address], [place]   [phone] Only accepts [place] [facility] [place] patients after they have their name applied to their card.   Self-Pay (no insurance) in [facility]:  Organization         Address  Phone   Notes  Sickle Cell Patients, [facility] [place] [phone]   [facility] [place] [phone]   [facility] [place]  [place], [place], [place] [phone]   [facility]/Dr. Osei-Bonsu  [address] [place] or [address], [place] [phone] Phone number for both [place] and [place] locations is the same.  Urgent Medical and [facility] [address] [place] [phone]   [facility] [address] [place] or [address] [phone]   [facility] [address], [place] [phone], phone; [phone], fax Sees patients 1st and 3rd Saturday of every month.  Must not qualify for public or private insurance (i.e. Medicaid, Medicare, [place] Health Choice, Veterans' Benefits)  Household income should be no more than 200%  of the poverty level The clinic cannot treat you if you are pregnant or think you are pregnant  Sexually transmitted diseases are not treated at the clinic.

## 2013-09-12 NOTE — ED Provider Notes (Signed)
CSN: [id number]     Arrival date & time 09/12/13  1753 History   This chart was scribed for non-physician practitioner, Charlann Lange, PA-C, working with Francine Graven, DO by Lowella Petties, ED Scribe. The patient was seen in room TR07C/TR07C. Patient's care was started at 7:03 PM.   Chief Complaint  Patient presents with  . Numbness   The history is provided by the patient. No language interpreter was used.  HPI Comments: [name] is a 58 y.o. female who presents to the Emergency Department complaining of numbness and tingling in her left foot that began a few months ago. She states that this does not bother her very much. She reports having two leg cramps in the past, but that this did not happen again. She reports that she has been taking insulin for the past two months. She reports that she was recently seen at out patient clinic, and they told her that her A1C is down. She states that they were rude to her at the clinic, and she wanted a second opinion. She states that she has started walking, but this has slowed down with the heat. She states that she does not want any medication.  Past Medical History  Diagnosis Date  . Diabetes mellitus type II, uncontrolled   . Hypertension   . Hyperlipidemia   . Ovarian cyst, left   . Anemia     due to menorrhagia, BL 8-10  . Vaginal cyst     nabothian and bartholin  . CKD (chronic kidney disease) stage 3, GFR 30-59 ml/min     baseline creatinine 1.4-1.7  . Anxiety   . Depression   . Postmenopausal bleeding 06/12/2008  . 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 UTI (lower urinary tract infection)   . IBS (irritable bowel syndrome)   . Chronic back pain   . Chronic abdominal pain   . DDD (degenerative disc disease), lumbar    Past Surgical History  Procedure Laterality Date  . Cardiac surgery      to repair congenital defect as a child   Family History  Problem Relation Age of Onset  . Stroke Father   .  Heart attack Father     Had MI in his 2s  . Stomach cancer Paternal Grandmother   . Diabetes Maternal Grandmother   . Cerebral palsy Daughter   . Anesthesia problems Neg Hx   . Hypotension Neg Hx   . Malignant hyperthermia Neg Hx   . Pseudochol deficiency Neg Hx    History  Substance Use Topics  . Smoking status: Never Smoker   . Smokeless tobacco: Former Systems developer  . Alcohol Use: No   OB History   Grav Para Term Preterm Abortions TAB SAB Ect Mult Living   3 1 1  2  2   1      Review of Systems  Neurological: Positive for numbness (left foot).  All other systems reviewed and are negative.  Allergies  Review of patient's allergies indicates no known allergies.  Home Medications   Prior to Admission medications   Medication Sig Start Date End Date Taking? Authorizing Provider  aspirin EC 81 MG tablet Take 81 mg by mouth daily.    Historical Provider, MD  Blood Glucose Monitoring Suppl (BLOOD GLUCOSE METER) kit Check your blood sugar at least once per day, before breakfast. ICD code 250.0 05/08/13   Blain Pais, MD  cephALEXin (KEFLEX) 500 MG capsule  Take 1 capsule (500 mg total) by mouth 4 (four) times daily. 05/26/13   Carvel Getting, NP  ciprofloxacin (CIPRO) 500 MG tablet Take 1 tablet (500 mg total) by mouth 2 (two) times daily. 05/08/13   Blain Pais, MD  Enalapril-Hydrochlorothiazide 5-12.5 MG per tablet Take 1 tablet by mouth daily. 08/10/13   Clinton Gallant, MD  Insulin Detemir (LEVEMIR) 100 UNIT/ML Pen Inject 14 Units into the skin daily. ICD code 250.0, DM2 06/06/13   Blain Pais, MD  metFORMIN (GLUCOPHAGE) 500 MG tablet Take 1 tablet (500 mg total) by mouth daily with breakfast. 09/10/13   [name], MD  Omega 3 1000 MG CAPS Take 1 capsule (1,000 mg total) by mouth 2 (two) times daily. 09/10/13   Ejiroghene Arlyce Dice, MD  simvastatin (ZOCOR) 40 MG tablet TAKE 1 TABLET (40 MG TOTAL) BY MOUTH AT BEDTIME. 01/27/13   [name], MD   Triage  Vitals: BP 136/65  Pulse 94  Temp(Src) 98.3 F (36.8 C)  Resp 18  SpO2 97% Physical Exam  Nursing note and vitals reviewed. Constitutional: She is oriented to person, place, and time. She appears well-developed and well-nourished. No distress.  HENT:  Head: Normocephalic and atraumatic.  Eyes: Conjunctivae and EOM are normal.  Neck: Neck supple. No tracheal deviation present.  Cardiovascular: Normal rate.   Pulmonary/Chest: Effort normal. No respiratory distress.  Musculoskeletal: Normal range of motion.  BLE unremarkable in appearance. No erythema, no swelling, no lesions.   Neurological: She is alert and oriented to person, place, and time.  Skin: Skin is warm and dry.  Psychiatric: She has a normal mood and affect. Her behavior is normal.    ED Course  Procedures (including critical care time) DIAGNOSTIC STUDIES: Oxygen Saturation is 97% on room air, normal by my interpretation.    COORDINATION OF CARE: 7:11 PM-Discussed treatment plan which with pt at bedside and pt agreed to plan.  Results for orders placed during the hospital encounter of 09/12/13  I-STAT CHEM 8, ED      Result Value Ref Range   Sodium 133 (*) 137 - 147 mEq/L   Potassium 4.3  3.7 - 5.3 mEq/L   Chloride 93 (*) 96 - 112 mEq/L   BUN 40 (*) 6 - 23 mg/dL   Creatinine, Ser 1.80 (*) 0.50 - 1.10 mg/dL   Glucose, Bld 410 (*) 70 - 99 mg/dL   Calcium, Ion 1.21  1.12 - 1.23 mmol/L   TCO2 27  0 - 100 mmol/L   Hemoglobin 12.6  12.0 - 15.0 g/dL   HCT 37.0  36.0 - 46.0 %    Labs Review Labs Reviewed - No data to display  Imaging Review No results found.   EKG Interpretation None      MDM   Final diagnoses:  None    1. Peripheral neuropathy 2. Chronic kidney disease 3. Hyperglycemia  Patient with a known history of kidney disease with a baseline Cr around 1.34-1.4, slightly elevated today at 1.80. Also with hyperglycemia of 410, persistently over 300 according to previous lab studies. No  vomiting. No evidence of acidosis. Well appearing today, arriving at ED with complaint of foot numbness for months. Stable for discharge. All questions answered. Encouraged to follow up with PCP in one week for recheck of Cr.  I personally performed the services described in this documentation, which was scribed in my presence. The recorded information has been reviewed and is accurate.  Dewaine Oats, PA-C 09/12/13 1950

## 2013-09-18 ENCOUNTER — Telehealth: Payer: Self-pay | Admitting: *Deleted

## 2013-09-18 ENCOUNTER — Emergency Department (INDEPENDENT_AMBULATORY_CARE_PROVIDER_SITE_OTHER)
Admission: EM | Admit: 2013-09-18 | Discharge: 2013-09-18 | Disposition: A | Discharge status: Home / Self Care | Payer: Self-pay | Source: Home / Self Care | Attending: Family Medicine | Admitting: Family Medicine

## 2013-09-18 ENCOUNTER — Encounter (HOSPITAL_COMMUNITY): Payer: Self-pay | Admitting: Emergency Medicine

## 2013-09-18 DIAGNOSIS — N189 Chronic kidney disease, unspecified: Secondary | ICD-10-CM

## 2013-09-18 DIAGNOSIS — E1142 Type 2 diabetes mellitus with diabetic polyneuropathy: Secondary | ICD-10-CM

## 2013-09-18 DIAGNOSIS — E1149 Type 2 diabetes mellitus with other diabetic neurological complication: Secondary | ICD-10-CM

## 2013-09-18 MED ORDER — GABAPENTIN 300 MG PO CAPS
300.0000 mg | ORAL_CAPSULE | Freq: Three times a day (TID) | ORAL | Status: DC
Start: 2013-09-18 — End: 2013-10-04

## 2013-09-18 NOTE — ED Provider Notes (Signed)
Medical screening examination/treatment/procedure(s) were performed by a resident physician or non-physician practitioner and as the supervising physician I was immediately available for consultation/collaboration.  Lynne Leader, MD    [name], MD 09/18/13 [phone]

## 2013-09-18 NOTE — Discharge Instructions (Signed)
Stop metformin and follow up with your primary care doctor   Diabetes and Exercise Exercising regularly is important. It is not just about losing weight. It has many health benefits, such as:  Improving your overall fitness, flexibility, and endurance.  Increasing your bone density.  Helping with weight control.  Decreasing your body fat.  Increasing your muscle strength.  Reducing stress and tension.  Improving your overall health. People with diabetes who exercise gain additional benefits because exercise:  Reduces appetite.  Improves the body's use of blood sugar (glucose).  Helps lower or control blood glucose.  Decreases blood pressure.  Helps control blood lipids (such as cholesterol and triglycerides).  Improves the body's use of the hormone insulin by:  Increasing the body's insulin sensitivity.  Reducing the body's insulin needs.  Decreases the risk for heart disease because exercising:  Lowers cholesterol and triglycerides levels.  Increases the levels of good cholesterol (such as high-density lipoproteins [HDL]) in the body.  Lowers blood glucose levels. YOUR ACTIVITY PLAN  Choose an activity that you enjoy and set realistic goals. Your health care provider or diabetes educator can help you make an activity plan that works for you. Exercise regularly as directed by your health care provider. This includes:  Performing resistance training twice a week such as push-ups, sit-ups, lifting weights, or using resistance bands.  Performing 150 minutes of cardio exercises each week such as walking, running, or playing sports.  Staying active and spending no more than 90 minutes at one time being inactive. Even short bursts of exercise are good for you. Three 10-minute sessions spread throughout the day are just as beneficial as a single 30-minute session. Some exercise ideas include:  Taking the dog for a walk.  Taking the stairs instead of the  elevator.  Dancing to your favorite song.  Doing an exercise video.  Doing your favorite exercise with a friend. RECOMMENDATIONS FOR EXERCISING WITH TYPE 1 OR TYPE 2 DIABETES   Check your blood glucose before exercising. If blood glucose levels are greater than 240 mg/dL, check for urine ketones. Do not exercise if ketones are present.  Avoid injecting insulin into areas of the body that are going to be exercised. For example, avoid injecting insulin into:  The arms when playing tennis.  The legs when jogging.  Keep a record of:  Food intake before and after you exercise.  Expected peak times of insulin action.  Blood glucose levels before and after you exercise.  The type and amount of exercise you have done.  Review your records with your health care provider. Your health care provider will help you to develop guidelines for adjusting food intake and insulin amounts before and after exercising.  If you take insulin or oral hypoglycemic agents, watch for signs and symptoms of hypoglycemia. They include:  Dizziness.  Shaking.  Sweating.  Chills.  Confusion.  Drink plenty of water while you exercise to prevent dehydration or heat stroke. Body water is lost during exercise and must be replaced.  Talk to your health care provider before starting an exercise program to make sure it is safe for you. Remember, almost any type of activity is better than none. Document Released: 04/16/2003 Document Revised: 06/10/2013 Document Reviewed: 07/03/2012 [facility] Patient Information 2015 [place], [place]. This information is not intended to replace advice given to you by your health care provider. Make sure you discuss any questions you have with your health care provider.

## 2013-09-18 NOTE — ED Notes (Signed)
C/o numbness in both feet onset 1 week ago. Her doctor would not see her anymore because she does not have insurance.  Went [facility] once and did not like them- they don't care.  They took her off the Glipizide and is using Insulin and and metformin.

## 2013-09-18 NOTE — Telephone Encounter (Signed)
Pt called and having problems with numbness in both feet past 2 weeks. Pt aware she needs to see Bonna Gains before appt can be made according to J. Brigitte Pulse note in [place]. I told pt it may open some door for care after talking to [facility]. Pt is upset. Hilda Blades Licet Dunphy RN 09/18/13 2:30PM

## 2013-09-18 NOTE — ED Provider Notes (Signed)
CSN: [id number]     Arrival date & time 09/18/13  1552 History   First MD Initiated Contact with Patient 09/18/13 1623     Chief Complaint  Patient presents with  . Numbness   (Consider location/radiation/quality/duration/timing/severity/associated sxs/prior Treatment) HPI Comments: 58 year old female presents complaining of a mild sensation of numbness in the bottoms of both of her feet. She states this started one week ago, although there are multiple notes about this in the computer stating that it has been present for times varying from the past 3 months to the past 2 weeks. She describes this as a very mild sensation of pain and numbness. It is relieved by walking and somewhat exacerbated by sitting still. She has a history of diabetes, and until recently her A1c was always 12-14. She denies any injury to her legs. She denies any swelling of the legs, or any significant pain   Past Medical History  Diagnosis Date  . Diabetes mellitus type II, uncontrolled   . Hypertension   . Hyperlipidemia   . Ovarian cyst, left   . Anemia     due to menorrhagia, BL 8-10  . Vaginal cyst     nabothian and bartholin  . CKD (chronic kidney disease) stage 3, GFR 30-59 ml/min     baseline creatinine 1.4-1.7  . Anxiety   . Depression   . Postmenopausal bleeding 06/12/2008  . 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 UTI (lower urinary tract infection)   . IBS (irritable bowel syndrome)   . Chronic back pain   . Chronic abdominal pain   . DDD (degenerative disc disease), lumbar    Past Surgical History  Procedure Laterality Date  . Cardiac surgery      to repair congenital defect as a child   Family History  Problem Relation Age of Onset  . Stroke Father   . Heart attack Father     Had MI in his 65s  . Stomach cancer Paternal Grandmother   . Diabetes Maternal Grandmother   . Cerebral palsy Daughter   . Anesthesia problems Neg Hx   . Hypotension Neg Hx   . Malignant  hyperthermia Neg Hx   . Pseudochol deficiency Neg Hx    History  Substance Use Topics  . Smoking status: Never Smoker   . Smokeless tobacco: Former Systems developer  . Alcohol Use: No   OB History   Grav Para Term Preterm Abortions TAB SAB Ect Mult Living   3 1 1  2  2   1      Review of Systems  Neurological: Positive for numbness (and mild pain).  All other systems reviewed and are negative.   Allergies  Review of patient's allergies indicates no known allergies.  Home Medications   Prior to Admission medications   Medication Sig Start Date End Date Taking? Authorizing Provider  aspirin EC 81 MG tablet Take 81 mg by mouth daily.   Yes Historical Provider, MD  Enalapril-Hydrochlorothiazide 5-12.5 MG per tablet Take 1 tablet by mouth daily. 08/10/13  Yes Clinton Gallant, MD  Insulin Detemir (LEVEMIR) 100 UNIT/ML Pen Inject 14 Units into the skin daily. ICD code 250.0, DM2 06/06/13  Yes Blain Pais, MD  metFORMIN (GLUCOPHAGE) 500 MG tablet Take 1 tablet (500 mg total) by mouth daily with breakfast. 09/10/13  Yes [name], MD  Omega 3 1000 MG CAPS Take 1 capsule (1,000 mg total) by mouth 2 (two) times daily. 09/10/13  Yes [name], MD  simvastatin (ZOCOR) 40 MG tablet TAKE 1 TABLET (40 MG TOTAL) BY MOUTH AT BEDTIME. 01/27/13  Yes [name], MD  Blood Glucose Monitoring Suppl (BLOOD GLUCOSE METER) kit Check your blood sugar at least once per day, before breakfast. ICD code 250.0 05/08/13   Blain Pais, MD  cephALEXin (KEFLEX) 500 MG capsule Take 1 capsule (500 mg total) by mouth 4 (four) times daily. 05/26/13   Carvel Getting, NP  ciprofloxacin (CIPRO) 500 MG tablet Take 1 tablet (500 mg total) by mouth 2 (two) times daily. 05/08/13   Blain Pais, MD  gabapentin (NEURONTIN) 300 MG capsule Take 1 capsule (300 mg total) by mouth 3 (three) times daily. 09/18/13   Liam Graham, PA-C   There were no vitals taken for this visit. Physical Exam  Nursing note  and vitals reviewed. Constitutional: She is oriented to person, place, and time. Vital signs are normal. She appears well-developed and well-nourished. No distress.  HENT:  Head: Normocephalic and atraumatic.  Cardiovascular: Normal rate, regular rhythm and normal heart sounds.   Pulses:      Dorsalis pedis pulses are 2+ on the right side, and 2+ on the left side.  Pulmonary/Chest: Breath sounds normal. No respiratory distress.  Musculoskeletal:       Right foot: Normal. She exhibits no tenderness and normal capillary refill.       Left foot: Normal. She exhibits no tenderness and normal capillary refill.  Neurological: She is alert and oriented to person, place, and time. She has normal strength and normal reflexes. No cranial nerve deficit or sensory deficit. She exhibits normal muscle tone. She displays a negative Romberg sign. Coordination and gait normal. GCS eye subscore is 4. GCS verbal subscore is 5. GCS motor subscore is 6.  Skin: Skin is warm and dry. No rash noted. She is not diaphoretic.  Psychiatric: She has a normal mood and affect. Judgment normal.    ED Course  Procedures (including critical care time) Labs Review Labs Reviewed - No data to display  Imaging Review No results found.   MDM   1. Diabetic peripheral neuropathy   2. CKD (chronic kidney disease), unspecified stage    Probable mild diabetic neuropathy, treated with gabapentin. Also review of her chart shows that her creatinine was 1.8 at her last ER visit, stop metformin. Followup with primary care provider.   Meds ordered this encounter  Medications  . gabapentin (NEURONTIN) 300 MG capsule    Sig: Take 1 capsule (300 mg total) by mouth 3 (three) times daily.    Dispense:  30 capsule    Refill:  0    Order Specific Question:  Supervising Provider    Answer:  Lynne Leader, [place]       Liam Graham, PA-C 09/18/13 1905

## 2013-09-18 NOTE — Telephone Encounter (Signed)
[name] become known to me generally as a patient who disrupted other patients care within the clinic due to her abrasive and self-centered behaviors. She was also disruptive to the front office and financial counselor, refusing to follow [facility] and [facility] policy at registration and not supplying an adequate guarantor for the visit, yet unwilling to enter into a payment plan or receive financial assistance through [facility] [name] feels she should not have to pay for her care or her medicines yet will not provide documentation to substantiate this claim.Marland Kitchen A specific example is her insulin. Because revenue stream activities were not fully supported in this environment until recently, I would let it go and focus on the many more high priority disruptive patients needing counseling and perhaps dismissal. At least a year had passed before a decision was made to correct our patients bad habits and unwarranted feeling of entitlement. Over the past several months [name] was identified, along with all other patients with guarantor "problems" to place into the revenue stream process according to policy. The first steps are to request privately a few times that she see the financial counselor. After that didn't happen, the policy was f/u by our office manager, then on to the director to make a decision about continuing her care in theory, I have never prevented anyone from receiving care in our clinic, and usually a discussion with the patient turns everything around and patients are compliant and we move on. When I spoke to [name] she didn't even try to make excuses but just told me I'm too busy, I deserve free care, my daughter and granddaughter need my care. "I don't have time for this and I'm done with this mess" referring to leaving the practice. She asked if she should leave now and I told her NO she should be seen and receive care and I want her to continue getting excellent care but I need the paperwork done,  she will not be scheduled another appointment until she meets with the [facility]. She again said I'm done with this mess anyway and left. I have no intention of denying her care indefinitely but [facility] policy also must be followed or we agree to what pieces she will follow but she is unwilling. I spoke to her PCP who also stated she was very abrupt and nasty in tone, and the schedulers who are very familiar with her verbal abuse and cursing when they suggest she comply with financial status.

## 2013-09-20 ENCOUNTER — Telehealth: Payer: Self-pay | Admitting: *Deleted

## 2013-09-20 NOTE — Telephone Encounter (Signed)
Pt called very upset with clinic. Discuss medication - states insulin - pt  gets insulin [facility] Rx was done 05/2013 for 1 year. Pt aware the call was transferred to J. Brigitte Pulse. Hilda Blades Gissel Keilman RN 09/20/13 11:40AM

## 2013-09-20 NOTE — Telephone Encounter (Signed)
Spoke with TW. Had a very hard time hearing her. Discussed her transferring her care to [facility], she could not give me an MD. I asked that she have the practice contact us and we can send her chart etc. Otherwise she needs to sign a release and we need to make copies. Fraser Din, went on to state something about a heart attack, at which point I suggested she go to the emergency department. She kept going back to "somebody didn't return my call" I asked her who but she never told me. Very bad reception. I asked her to please call me back so we can hear each other better. Spoke with GG and she stated that refills didin't have any messages from TW.  I have also had complaints from MR and Triage about abusive calls today from TW.

## 2013-09-25 ENCOUNTER — Ambulatory Visit: Payer: Self-pay

## 2013-10-03 ENCOUNTER — Telehealth: Payer: Self-pay | Admitting: *Deleted

## 2013-10-03 NOTE — Telephone Encounter (Signed)
Pt called - needs FU ER recently - was informed abnormal lab results.  Now has [facility] Special. Appt made Dr Gordy Levan 10/04/13 3:15PM - pt aware. Hilda Blades Isyss Espinal RN 10/03/13 3:25PM

## 2013-10-04 ENCOUNTER — Ambulatory Visit: Payer: No Typology Code available for payment source | Admitting: Internal Medicine

## 2013-10-04 ENCOUNTER — Ambulatory Visit (INDEPENDENT_AMBULATORY_CARE_PROVIDER_SITE_OTHER): Payer: No Typology Code available for payment source | Admitting: Internal Medicine

## 2013-10-04 ENCOUNTER — Encounter: Payer: Self-pay | Admitting: Internal Medicine

## 2013-10-04 VITALS — BP 121/70 | HR 99 | Temp 97.7°F | Ht 64.0 in | Wt 162.1 lb

## 2013-10-04 DIAGNOSIS — N281 Cyst of kidney, acquired: Secondary | ICD-10-CM

## 2013-10-04 DIAGNOSIS — E1122 Type 2 diabetes mellitus with diabetic chronic kidney disease: Secondary | ICD-10-CM

## 2013-10-04 DIAGNOSIS — Z124 Encounter for screening for malignant neoplasm of cervix: Secondary | ICD-10-CM

## 2013-10-04 DIAGNOSIS — N183 Chronic kidney disease, stage 3 unspecified: Secondary | ICD-10-CM

## 2013-10-04 DIAGNOSIS — I10 Essential (primary) hypertension: Secondary | ICD-10-CM

## 2013-10-04 DIAGNOSIS — Z1231 Encounter for screening mammogram for malignant neoplasm of breast: Secondary | ICD-10-CM

## 2013-10-04 DIAGNOSIS — N189 Chronic kidney disease, unspecified: Secondary | ICD-10-CM

## 2013-10-04 DIAGNOSIS — E1129 Type 2 diabetes mellitus with other diabetic kidney complication: Secondary | ICD-10-CM

## 2013-10-04 DIAGNOSIS — Z23 Encounter for immunization: Secondary | ICD-10-CM

## 2013-10-04 LAB — POCT URINALYSIS DIPSTICK
Bilirubin, UA: NEGATIVE
Glucose, UA: >=1000
Ketones, UA: NEGATIVE
Nitrite, UA: NEGATIVE
PH UA: 5
Protein, UA: NEGATIVE
RBC UA: NEGATIVE
Spec Grav, UA: <=1.005
UROBILINOGEN UA: 0.2

## 2013-10-04 LAB — COMPLETE METABOLIC PANEL WITH GFR
ALT: 12 U/L (ref 0–35)
AST: 13 U/L (ref 0–37)
Albumin: 4.2 g/dL (ref 3.5–5.2)
Alkaline Phosphatase: 101 U/L (ref 39–117)
BUN: 34 mg/dL — ABNORMAL HIGH (ref 6–23)
CALCIUM: 9.6 mg/dL (ref 8.4–10.5)
CO2: 31 meq/L (ref 19–32)
CREATININE: 1.64 mg/dL — AB (ref 0.50–1.10)
Chloride: 89 mEq/L — ABNORMAL LOW (ref 96–112)
GFR, EST AFRICAN AMERICAN: 40 mL/min — AB
GFR, EST NON AFRICAN AMERICAN: 35 mL/min — AB
Glucose, Bld: 527 mg/dL (ref 70–99)
Potassium: 4.4 mEq/L (ref 3.5–5.3)
Sodium: 128 mEq/L — ABNORMAL LOW (ref 135–145)
Total Bilirubin: 0.6 mg/dL (ref 0.2–1.2)
Total Protein: 7 g/dL (ref 6.0–8.3)

## 2013-10-04 LAB — HM DIABETES EYE EXAM

## 2013-10-04 LAB — GLUCOSE, CAPILLARY: GLUCOSE-CAPILLARY: 526 mg/dL — AB (ref 70–99)

## 2013-10-04 MED ORDER — INSULIN DETEMIR 100 UNIT/ML FLEXPEN: 16.0000 [IU] | PEN_INJECTOR | Freq: Every day | SUBCUTANEOUS | Status: DC

## 2013-10-04 NOTE — Assessment & Plan Note (Addendum)
BP well controlled today on enalapril/HCTZ.  -continue current meds of enalapril/HCTZ  -follow up BMP next week

## 2013-10-04 NOTE — Assessment & Plan Note (Addendum)
Last HA1c 8.8 three weeks ago.  CBG today was ~500.  Pt reports compliance with levemir 14 units daily, although there have been issues in the past with her being able to afford it.  She was on metformin until recently, but was told to discontinue metformin 500mg  due to elevated SCr while at urgent care.  She was taken off glipizide at some point for DM but is unclear exactly why.  She was also on actos but was taken off that as well.  She is very upset about her care here.  She does not check her blood sugars because she has no test strips (has a meter).  I encouraged her to obtain strips from the [facility] pharmacy and she indicated she would.   -increased levemir to 16 units daily  -follow up in 1 week to reassess and add additional agent (used to be on glipizide and actos but d/c?) since taken off metformin  -repeat BMP to check SCr -referral to [facility] -provided info on hypoglycemia s/s

## 2013-10-04 NOTE — Patient Instructions (Signed)
Thank you for your visit today.   Please return to the internal medicine clinic in 1 week or sooner if needed.   I will recheck your labs today.  [facility] I have also ordered a mammogram. Increase levemir to 16 units daily.  Please check your blood sugar at least once daily.    Please be sure to bring all of your medications with you to every visit; this includes herbal supplements, vitamins, eye drops, and any over-the-counter medications.   Should you have any questions regarding your medications and/or any new or worsening symptoms, please be sure to call the clinic at [phone].   If you believe that you are suffering from a life threatening condition or one that may result in the loss of limb or function, then you should call 911 or proceed to the nearest Emergency Department.     Low Blood Sugar Low blood sugar (hypoglycemia) means that the level of sugar in your blood is lower than it should be. Signs of low blood sugar include:  Getting sweaty.  Feeling hungry.  Feeling dizzy or weak.  Feeling sleepier than normal.  Feeling nervous.  Headaches.  Having a fast heartbeat. Low blood sugar can happen fast and can be an emergency. Your doctor can do tests to check your blood sugar level. You can have low blood sugar and not have diabetes. HOME CARE  Check your blood sugar as told by your doctor. If it is less than 70 mg/dl or as told by your doctor, take 1 of the following:  3 to 4 glucose tablets.   cup clear juice.   cup soda pop, not diet.  1 cup milk.  5 to 6 hard candies.  Recheck blood sugar after 15 minutes. Repeat until it is at the right level.  Eat a snack if it is more than 1 hour until the next meal.  Only take medicine as told by your doctor.  Do not skip meals. Eat on time.  Do not drink alcohol except with meals.  Check your blood glucose before driving.  Check your blood glucose before  and after exercise.  Always carry treatment with you, such as glucose pills.  Always wear a medical alert bracelet if you have diabetes. GET HELP RIGHT AWAY IF:   Your blood glucose goes below 70 mg/dl or as told by your doctor, and you:  Are confused.  Are not able to swallow.  Pass out (faint).  You cannot treat yourself. You may need someone to help you.  You have low blood sugar problems often.  You have problems from your medicines.  You are not feeling better after 3 to 4 days.  You have vision changes. MAKE SURE YOU:   Understand these instructions.  Will watch this condition.  Will get help right away if you are not doing well or get worse. Document Released: 04/20/2009 Document Revised: 04/18/2011 Document Reviewed: 04/20/2009 [facility] Patient Information 2015 [place], [place]. This information is not intended to replace advice given to you by your health care provider. Make sure you discuss any questions you have with your health care provider.    A healthy lifestyle and preventative care can promote health and wellness.   Maintain regular health, dental, and eye exams.  Eat a healthy diet. Foods like vegetables, fruits, whole grains, low-fat dairy products, and lean protein foods contain the nutrients you need without too many calories. Decrease your intake of  foods high in solid fats, added sugars, and salt. Get information about a proper diet from your caregiver, if necessary.  Regular physical exercise is one of the most important things you can do for your health. Most adults should get at least 150 minutes of moderate-intensity exercise (any activity that increases your heart rate and causes you to sweat) each week. In addition, most adults need muscle-strengthening exercises on 2 or more days a week.   Maintain a healthy weight. The body mass index (BMI) is a screening tool to identify possible weight problems. It provides an estimate of body fat based on  height and weight. Your caregiver can help determine your BMI, and can help you achieve or maintain a healthy weight. For adults 20 years and older:  A BMI below 18.5 is considered underweight.  A BMI of 18.5 to 24.9 is normal.  A BMI of 25 to 29.9 is considered overweight.  A BMI of 30 and above is considered obese.

## 2013-10-04 NOTE — Assessment & Plan Note (Addendum)
Pt comes in for an elevated SCr (1.8, baseline ~1.5) level found while she had blood work at urgent care.  Does report having an episode of diarrhea at that time.  Denies any NSAID use or recent procedures involving contrast.  No new medications (has been on ACEi/HCTZ combo for a while).  No rash. BP well controlled with no known episodes of hypotension.  Has never had a renal US, however, onI she had numerous cysts bilateral that did not appear to be simple cysts.  She was supposed to follow up with an MRI but one was never ordered.   -recheck SCr -will discontinue ACEi/HCTZ if continued elevation today  -reminded to drink plenty of fluids and stay hydrated in the heat and to avoid NSAIDS  -advised to take tylenol for pain if needed  -return to clinic in 1 week to recheck BMP  -MRI to evaluate renal cysts

## 2013-10-04 NOTE — Progress Notes (Signed)
Patient ID: [name], female   DOB: [birth year], 57 y.o.   MRN: [id number]    Subjective:   Patient ID: [name] female    DOB: [birth year] 58 y.o.    MRN: [id number] Health Maintenance Due: Health Maintenance Due  Topic Date Due  . Colonoscopy  04/22/2007  . Mammogram  07/30/2009  . Pap Smear  08/08/2011  . Ophthalmology Exam  09/05/2013  . Influenza Vaccine  09/07/2013  . Foot Exam  09/12/2013    _________________________________________________  HPI: [name] is a 58 y.o. female here for an acute visit for an elevated SCr found while at urgent care.   When I walk into the room, she immediately starts complaining about the clinic and the care she has received here.  She states "I don't know if I am going to come back here."  She also threatens that she is "going to go to hospital administration."  She continues to go on about how she doesn't like the policies of the clinic and how she doesn't care for the administration of the clinic and how she basically thinks she should get free care.  She continued to complain and I listened to her and voiced understanding about her concerns but that she really needed to speak with the clinic administration to express her concerns.  I suggested she speak with our Education officer, museum as well.  After listening to her for ~15-20 minutes I explained to her that I was here to help her and let's focus on the reason why she is here.  She seemed to settle down some at that point and we began to talk about her chronic health issues.  I also spoke with Illene Regulus who also saw the patient today and she indicated she didn't wish to talk to him now but that she was very satisfied with her care today. Of note, it was also difficult to talk with her because her daughter (wheel chair bound) kept interjecting during the entire conversation.  In the future, it may be more appropriate to have an an additional 15 minutes added on to her appt time.     Pt has a PMH  outlined below.  Please see problem-based charting assessment and plan note for further details of medical issues addressed at today's visit.  PMH: Past Medical History  Diagnosis Date  . Diabetes mellitus type II, uncontrolled   . Hypertension   . Hyperlipidemia   . Ovarian cyst, left   . Anemia     due to menorrhagia, BL 8-10  . Vaginal cyst     nabothian and bartholin  . CKD (chronic kidney disease) stage 3, GFR 30-59 ml/min     baseline creatinine 1.4-1.7  . Anxiety   . Depression   . Postmenopausal bleeding 06/12/2008  . 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 UTI (lower urinary tract infection)   . IBS (irritable bowel syndrome)   . Chronic back pain   . Chronic abdominal pain   . DDD (degenerative disc disease), lumbar     Medications: Current Outpatient Prescriptions on File Prior to Visit  Medication Sig Dispense Refill  . aspirin EC 81 MG tablet Take 81 mg by mouth daily.      . Blood Glucose Monitoring Suppl (BLOOD GLUCOSE METER) kit Check your blood sugar at least once per day, before breakfast. ICD code 250.0  1 each  0  . Enalapril-Hydrochlorothiazide 5-12.5 MG per tablet Take 1 tablet  by mouth daily.  30 tablet  1  . simvastatin (ZOCOR) 40 MG tablet TAKE 1 TABLET (40 MG TOTAL) BY MOUTH AT BEDTIME.  30 tablet  6  . Omega 3 1000 MG CAPS Take 1 capsule (1,000 mg total) by mouth 2 (two) times daily.  90 each  1  . [DISCONTINUED] pantoprazole (PROTONIX) 20 MG tablet Take 2 tablets (40 mg total) by mouth daily.  30 tablet  1  . [DISCONTINUED] sertraline (ZOLOFT) 100 MG tablet Take 1 tablet (100 mg total) by mouth daily.  30 tablet  2   No current facility-administered medications on file prior to visit.    Allergies: No Known Allergies  FH: Family History  Problem Relation Age of Onset  . Stroke Father   . Heart attack Father     Had MI in his 54s  . Stomach cancer Paternal Grandmother   . Diabetes Maternal Grandmother   . Cerebral  palsy Daughter   . Anesthesia problems Neg Hx   . Hypotension Neg Hx   . Malignant hyperthermia Neg Hx   . Pseudochol deficiency Neg Hx     SH: History   Social History  . Marital Status: Divorced    Spouse Name: N/A    Number of Children: 1  . Years of Education: 12th grade   Occupational History  . 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 Smoking status: Never Smoker   . Smokeless tobacco: Former Systems developer  . Alcohol Use: No  . Drug Use: No  . Sexual Activity: None   Other Topics Concern  . 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    Review of Systems: Constitutional: Negative for fever, chills and weight loss.  Eyes: Negative for blurred vision.  Respiratory: Negative for cough and shortness of breath.  Cardiovascular: Negative for chest pain, palpitations and leg swelling.  Gastrointestinal: Negative for nausea, vomiting, abdominal pain, diarrhea, constipation and blood in stool.  Genitourinary: Negative for dysuria, urgency and frequency.  Musculoskeletal: Negative for myalgias and +back pain.  Neurological: Negative for dizziness, weakness and headaches.     Objective:   Vital Signs: Filed Vitals:   10/04/13 1456  BP: 121/70  Pulse: 99  Temp: 97.7 F (36.5 C)  TempSrc: Oral  Height: _0  (1.626 m)  Weight: 162 lb 1.6 oz (73.528 kg)  SpO2: 97%     BP Readings from Last 3 Encounters:  10/04/13 121/70  09/12/13 136/65  09/10/13 138/84    Physical Exam: Constitutional: Vital signs reviewed.  Patient is well-developed and well-nourished in NAD and cooperative with exam.  Head: Normocephalic and atraumatic. Eyes: PERRL, EOMI, conjunctivae nl, no scleral icterus.  Neck: Supple. Cardiovascular: RRR, no MRG. Pulmonary/Chest: normal effort, non-tender to palpation, CTAB, no wheezes, rales, or rhonchi. Abdominal: Soft. NT/ND +BS. Musculoskeletal: Full range ofmotion. no pain,edema,or  deformity.  Nocyanosis,clubbing,oredema. Neurological: A&O x3, cranial nerves II-XII are grossly intact, moving all extremities. Extremities: 2+DP b/l; no pitting edema. Skin: Warm, dry and intact. No rash.  Assessment & Plan:   Assessment and plan was discussed and formulated with my attending.

## 2013-10-04 NOTE — Assessment & Plan Note (Addendum)
-  needs repeat MRI per radiology's recommendations (would repeat BMP today to see CrCl before giving contrast)

## 2013-10-05 LAB — URINALYSIS, MICROSCOPIC ONLY
Bacteria, UA: NONE SEEN
CASTS: NONE SEEN
CRYSTALS: NONE SEEN
Squamous Epithelial / LPF: NONE SEEN

## 2013-10-05 LAB — URINALYSIS, ROUTINE W REFLEX MICROSCOPIC
BILIRUBIN URINE: NEGATIVE
Hgb urine dipstick: NEGATIVE
KETONES UR: NEGATIVE mg/dL
Nitrite: NEGATIVE
PROTEIN: NEGATIVE mg/dL
Specific Gravity, Urine: 1.018 (ref 1.005–1.030)
Urobilinogen, UA: 0.2 mg/dL (ref 0.0–1.0)
pH: 5 (ref 5.0–8.0)

## 2013-10-05 LAB — URINE CULTURE

## 2013-10-06 ENCOUNTER — Telehealth: Payer: Self-pay | Admitting: Internal Medicine

## 2013-10-06 NOTE — Telephone Encounter (Signed)
Received a page in the on-call pager service. Ms. Begin paged to received the results of her urine culture. I discussed the results with her which showed only 20K colonies of CORYNEBACTERIUM SPECIES.  She states that she has back pain but no urinary symptoms. She stated that she would tale Tylenol for her back pain or go to the ED if her pain worsened.   She thanked me for the call and said she may also come in to the clinic tomorrow if her pain persists.

## 2013-10-08 ENCOUNTER — Ambulatory Visit: Payer: No Typology Code available for payment source | Admitting: Internal Medicine

## 2013-10-08 NOTE — Progress Notes (Signed)
INTERNAL MEDICINE TEACHING ATTENDING ADDENDUM - Lux Meaders, MD: I reviewed and discussed at the time of visit with the resident Dr. Gill, the patient's medical history, physical examination, diagnosis and results of pertinent tests and treatment and I agree with the patient's care as documented.  

## 2013-10-10 ENCOUNTER — Encounter: Payer: Self-pay | Admitting: Internal Medicine

## 2013-10-11 ENCOUNTER — Ambulatory Visit (HOSPITAL_COMMUNITY): Payer: No Typology Code available for payment source

## 2013-10-12 ENCOUNTER — Telehealth: Payer: Self-pay | Admitting: Internal Medicine

## 2013-10-12 NOTE — Telephone Encounter (Signed)
   Reason for call:   I received a call from [name] at 4:20  PM indicating that her CBG was 531.   Pertinent Data:   She states that she is feeling well overall and was walking around [place]. She states that she ate an egg sandwich and an apple this morning and a ham sandwich and french fries for lunch. She states that she just received her meter and checked her sugars an found it to be 531. She was seen by Dr. Gordy Levan in clinic for hyperglycemia and her levemir was increased to 16u daily and she was. She is not checking her CBGs at all and is not following a diabetic friendly diet. She denies any dizziness, lightheadedness, or vision changes.    Assessment / Plan / Recommendations:   Since she is asymptomatic, she is to increase the Levemir to 18u daily.   She was asked to check her CBGs TID before meals, and if she was unable to check them that frequently, she needed to at least check her CBGs BID b/f breakfast and dinner.   If her blood sugars continue to increase or if they drop below 70, she is to call the clinic or the on call pager.   I recommended that she be sure to stay hydrated with water and watch her diet  She is to follow up in the clinic next week. I will notify the clinic staff to schedule her an appt for next week; they pt was also asked to call on Tues after Labor Day.   As always, pt is advised that if symptoms worsen or new symptoms arise, they should go to an urgent care facility or to to ER for further evaluation.   Otho Bellows, MD   10/12/2013, 4:21 PM

## 2013-10-15 ENCOUNTER — Telehealth: Payer: Self-pay | Admitting: Internal Medicine

## 2013-10-15 NOTE — Telephone Encounter (Signed)
   Reason for call:   I received a call from Ms. Maximino Greenland at 7:45  PM indicating she is currently at [facility] visiting her daughter who had knee surgery.  She notes that her blood sugars have continued to remain high (last ~400).  She also reports that her blood pressure is elevated at 180/59.  And that she is concerned she has IBS ( due to some constipation) for which she bought and used some OTC stool softeners today.   Pertinent Data:   DM: Review of records show she called in three days ago and was instructed to increase Levemir to 18 units. Earlier this month her metformin was discontinued due to decreasing renal function.  She informed me she had already taken 18 units of Levemir about an hour ago.  She is not prescribed short acting insulin.  She admits some polyuria and polydispia, no changes in vision, nausea or vomiting.  HTN: Reports she has taken her enalapril HCTZ already today, at last clinic visit a week and a half ago her BP was well controlled.  ?IBS: currently no diarrhea.  Has appointment scheduled for next week in clinic.   Assessment / Plan / Recommendations:   I advised patient to continue to drink plenty of water and informed her the levemir is likely just starting to take effect.  She will need to give it some time to work.  I instructed her to check her glucose again before bed and to continue checking 3-4 times a day.  If sugar is rising she will need to call back.  For her blood pressure I instructed her to continue her current regimen and call back or go to the ED if she develops a HA or changes in vision.  IBS: I asked her to call the clinic in the morning to try to move forward her appointment.  Patient did note that she is currently at [facility] in [place] in her daughters room. I advised her that if she feels worse or feels that she needs immediate care she should call her daughter's nurse or present to [facility] Emergency Department.  She  verbalized understanding of this plan.   Lucious Groves, DO   10/15/2013, 7:46 PM

## 2013-10-18 ENCOUNTER — Encounter: Payer: Self-pay | Admitting: Internal Medicine

## 2013-10-18 ENCOUNTER — Ambulatory Visit (INDEPENDENT_AMBULATORY_CARE_PROVIDER_SITE_OTHER): Payer: No Typology Code available for payment source | Admitting: Internal Medicine

## 2013-10-18 ENCOUNTER — Telehealth: Payer: Self-pay | Admitting: *Deleted

## 2013-10-18 VITALS — BP 138/77 | HR 98 | Temp 98.0°F | Ht 64.0 in | Wt 164.5 lb

## 2013-10-18 DIAGNOSIS — N183 Chronic kidney disease, stage 3 unspecified: Secondary | ICD-10-CM

## 2013-10-18 DIAGNOSIS — I129 Hypertensive chronic kidney disease with stage 1 through stage 4 chronic kidney disease, or unspecified chronic kidney disease: Secondary | ICD-10-CM

## 2013-10-18 DIAGNOSIS — E1165 Type 2 diabetes mellitus with hyperglycemia: Secondary | ICD-10-CM

## 2013-10-18 DIAGNOSIS — I10 Essential (primary) hypertension: Secondary | ICD-10-CM

## 2013-10-18 DIAGNOSIS — E1122 Type 2 diabetes mellitus with diabetic chronic kidney disease: Secondary | ICD-10-CM

## 2013-10-18 DIAGNOSIS — E1129 Type 2 diabetes mellitus with other diabetic kidney complication: Secondary | ICD-10-CM

## 2013-10-18 LAB — GLUCOSE, CAPILLARY: GLUCOSE-CAPILLARY: 394 mg/dL — AB (ref 70–99)

## 2013-10-18 MED ORDER — INSULIN DETEMIR 100 UNIT/ML FLEXPEN: 22.0000 [IU] | PEN_INJECTOR | Freq: Every day | SUBCUTANEOUS | Status: DC

## 2013-10-18 NOTE — Assessment & Plan Note (Addendum)
Lab Results  Component Value Date   HGBA1C 8.8 09/10/2013   HGBA1C 13.3 05/08/2013   HGBA1C 12.6 01/25/2013     Assessment: Diabetes control:  Uncontrolled Progress toward A1C goal:   Improvement compared to prior. Comments: Pt called on call resident about elevated blood sugar- >500s, pt says she was taking 14u of levemir, pt was told to increase Levemir to 18u daily. Pt denies urinary symptoms, ulcers, fever, SOB or Cough, complains of constipation. Pt blood sugars per her glucometer over the past week- >400s on levemir- 18 u daily. Pt was in [facility] 10/16/2013 for her daughter who is challenged, pts blood sugars were echeck and was found to be 500s, pt had some labs drawn and was given short acting insulin. Electrolytes on 2 consecutive days- Anion gap of 12, and then 6 the subsequent day. Pt Cr also improved from 1.59 on the first day to 1.2 the second day.   Plan: Medications:  Will increase insulin to 22u daily. Pt might go back on metformin soon, considering improved Cr- 1.2, but will monitor for now, and consider starting if Cr persistently acceptable. As contraindicated to use metformin with Cr .1.4 in females. Home glucose monitoring: Frequency:  Once daily Timing:  morning Instruction/counseling given: reminded to get eye exam, reminded to bring blood glucose meter & log to each visit and discussed diet Educational resources provided:  Glucometer log Self management tools provided:   Other plans: Pt will like to meet Butch Penny to talk about nutrition. Pt is ready to work on her diet.  - Consider restarting metformin if Cr continues to improve. - See in 2 weeks.

## 2013-10-18 NOTE — Patient Instructions (Signed)
We will increase your insulin to 22u once a day. For now we will still not start the metformin as your kidneys arent doing so well.  We will like to see you back in 2 weeks with your glucometer. Please it is important you continue to check your blood sugars once a day.  Please bring your medications to every visit.

## 2013-10-18 NOTE — Assessment & Plan Note (Signed)
As per lab results on care everywhere, Cr- 10/15/2013- 1.27 improved form 1.5 the day prior.

## 2013-10-18 NOTE — Telephone Encounter (Signed)
Pt called with elevated CBG today after Breakfast. States it's about 500.  She can come to clinic now, we have an open slot with PCP

## 2013-10-18 NOTE — Progress Notes (Signed)
Patient ID: [name], female   DOB: [birth year], 58 y.o.   MRN: [id number]  Case discussed with Dr. Denton Brick at the time of the visit.  We reviewed the resident's history and exam and pertinent patient test results.  I agree with the assessment, diagnosis, and plan of care documented in the resident's note.

## 2013-10-18 NOTE — Progress Notes (Signed)
Patient ID: [name], female   DOB: 11/30/1955, 58 y.o.   MRN: [id number]   Subjective:   Patient ID: [name] female   DOB: 22-Aug-1955 58 y.o.   MRN: [id number]  HPI: [name] is a 58 y.o. with PMH listed below, presented today with complaints of high blood sugars .500.   Past Medical History  Diagnosis Date  . Diabetes mellitus type II, uncontrolled   . Hypertension   . Hyperlipidemia   . Ovarian cyst, left   . Anemia     due to menorrhagia, BL 8-10  . Vaginal cyst     nabothian and bartholin  . CKD (chronic kidney disease) stage 3, GFR 30-59 ml/min     baseline creatinine 1.4-1.7  . Anxiety   . Depression   . Postmenopausal bleeding 06/12/2008  . 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 UTI (lower urinary tract infection)   . IBS (irritable bowel syndrome)   . Chronic back pain   . Chronic abdominal pain   . DDD (degenerative disc disease), lumbar   . Bilateral renal cysts 01/15/2009    Qualifier: Diagnosis of  By: Tyrell Antonio MD, [place]     Review of Systems: CONSTITUTIONAL- No Fever, weightloss, night sweat or change in appetite. SKIN- No Rash, colour changes or itching. HEAD- No Headache or dizziness. EYES- No  Vision change. RESPIRATORY- No Cough or SOB. CARDIAC- No Palpitations, DOE, PND or chest pain. GI- No nausea, vomiting, diarrhoea, constipation, abd pain. URINARY- No Frequency, urgency, straining or dysuria. NEUROLOGIC- No Numbness, syncope, seizures or burning. [facility]- Denies depression or anxiety.  Objective:  Physical Exam: Filed Vitals:   10/18/13 1506  BP: 138/77  Pulse: 98  Temp: 98 F (36.7 C)  TempSrc: Oral  Height: 5\' 4"  (1.626 m)  Weight: 164 lb 8 oz (74.617 kg)  SpO2: 97%   GENERAL- alert, co-operative, appears as stated age, not in any distress. HEENT- Atraumatic, normocephalic, PERRL, EOMI, oral mucosa appears moist, good and intact dentition. No carotid bruit, no cervical LN enlargement, thyroid  does not appear enlarged, neck supple. CARDIAC- RRR, no murmurs, rubs or gallops. RESP- Moving equal volumes of air, and clear to auscultation bilaterally, no wheezes or crackles. ABDOMEN- Soft, nontender, no guarding or rebound, no palpable masses or organomegaly, bowel sounds present. BACK- Normal curvature of the spine, No tenderness along the vertebrae, no CVA tenderness. NEURO- No obvious Cr N abnormality, strenght upper and lower extremities- 5/5, Sensation intact- globally, DTRs- Normal, finger to nose test normal bilat, rapid alternating movement- intact, Gait- Normal. EXTREMITIES- pulse 2+, symmetric, no pedal edema. SKIN- Warm, dry, No rash or lesion. PSYCH- Normal mood and affect, appropriate thought content and speech.  Assessment & Plan:   The patient's case and plan of care was discussed with attending physician, Dr. Ellwood Dense.   Please see problem based charting for assessment and plan.

## 2013-10-18 NOTE — Assessment & Plan Note (Signed)
BP Readings from Last 3 Encounters:  10/18/13 138/77  10/04/13 121/70  09/12/13 136/65    Lab Results  Component Value Date   NA 128* 10/04/2013   K 4.4 10/04/2013   CREATININE 1.64* 10/04/2013    Assessment: Blood pressure control:  Controlled Progress toward BP goal:   At goal. Comments: Compliant with Enalapril-HCTZ- 5-12.5mg  daily.  Plan: Medications:  continue current medications Other plans:

## 2013-10-19 ENCOUNTER — Encounter (HOSPITAL_COMMUNITY): Payer: Self-pay | Admitting: Emergency Medicine

## 2013-10-19 ENCOUNTER — Emergency Department (HOSPITAL_COMMUNITY): Payer: No Typology Code available for payment source

## 2013-10-19 ENCOUNTER — Emergency Department (HOSPITAL_COMMUNITY)
Admission: EM | Admit: 2013-10-19 | Discharge: 2013-10-19 | Disposition: A | Discharge status: Home / Self Care | Payer: No Typology Code available for payment source | Attending: Emergency Medicine | Admitting: Emergency Medicine

## 2013-10-19 DIAGNOSIS — R1030 Lower abdominal pain, unspecified: Secondary | ICD-10-CM

## 2013-10-19 DIAGNOSIS — E785 Hyperlipidemia, unspecified: Secondary | ICD-10-CM | POA: Insufficient documentation

## 2013-10-19 DIAGNOSIS — E119 Type 2 diabetes mellitus without complications: Secondary | ICD-10-CM | POA: Insufficient documentation

## 2013-10-19 DIAGNOSIS — G8929 Other chronic pain: Secondary | ICD-10-CM | POA: Insufficient documentation

## 2013-10-19 DIAGNOSIS — Z862 Personal history of diseases of the blood and blood-forming organs and certain disorders involving the immune mechanism: Secondary | ICD-10-CM | POA: Insufficient documentation

## 2013-10-19 DIAGNOSIS — Z794 Long term (current) use of insulin: Secondary | ICD-10-CM | POA: Insufficient documentation

## 2013-10-19 DIAGNOSIS — R1032 Left lower quadrant pain: Secondary | ICD-10-CM | POA: Insufficient documentation

## 2013-10-19 DIAGNOSIS — Z8719 Personal history of other diseases of the digestive system: Secondary | ICD-10-CM | POA: Insufficient documentation

## 2013-10-19 DIAGNOSIS — Z8659 Personal history of other mental and behavioral disorders: Secondary | ICD-10-CM | POA: Insufficient documentation

## 2013-10-19 DIAGNOSIS — Z79899 Other long term (current) drug therapy: Secondary | ICD-10-CM | POA: Insufficient documentation

## 2013-10-19 DIAGNOSIS — M5137 Other intervertebral disc degeneration, lumbosacral region: Secondary | ICD-10-CM | POA: Insufficient documentation

## 2013-10-19 DIAGNOSIS — N183 Chronic kidney disease, stage 3 unspecified: Secondary | ICD-10-CM | POA: Insufficient documentation

## 2013-10-19 DIAGNOSIS — I129 Hypertensive chronic kidney disease with stage 1 through stage 4 chronic kidney disease, or unspecified chronic kidney disease: Secondary | ICD-10-CM | POA: Insufficient documentation

## 2013-10-19 DIAGNOSIS — Z87440 Personal history of urinary (tract) infections: Secondary | ICD-10-CM | POA: Insufficient documentation

## 2013-10-19 DIAGNOSIS — Z7982 Long term (current) use of aspirin: Secondary | ICD-10-CM | POA: Insufficient documentation

## 2013-10-19 DIAGNOSIS — Z8774 Personal history of (corrected) congenital malformations of heart and circulatory system: Secondary | ICD-10-CM | POA: Insufficient documentation

## 2013-10-19 DIAGNOSIS — R1031 Right lower quadrant pain: Secondary | ICD-10-CM | POA: Insufficient documentation

## 2013-10-19 DIAGNOSIS — M51379 Other intervertebral disc degeneration, lumbosacral region without mention of lumbar back pain or lower extremity pain: Secondary | ICD-10-CM | POA: Insufficient documentation

## 2013-10-19 DIAGNOSIS — R109 Unspecified abdominal pain: Secondary | ICD-10-CM | POA: Insufficient documentation

## 2013-10-19 LAB — COMPREHENSIVE METABOLIC PANEL
ALBUMIN: 3.6 g/dL (ref 3.5–5.2)
ALK PHOS: 104 U/L (ref 39–117)
ALT: 31 U/L (ref 0–35)
AST: 34 U/L (ref 0–37)
Anion gap: 14 (ref 5–15)
BUN: 27 mg/dL — AB (ref 6–23)
CALCIUM: 8.9 mg/dL (ref 8.4–10.5)
CO2: 24 mEq/L (ref 19–32)
Chloride: 93 mEq/L — ABNORMAL LOW (ref 96–112)
Creatinine, Ser: 1.4 mg/dL — ABNORMAL HIGH (ref 0.50–1.10)
GFR calc non Af Amer: 41 mL/min — ABNORMAL LOW (ref >90)
GFR, EST AFRICAN AMERICAN: 48 mL/min — AB (ref >90)
Glucose, Bld: 455 mg/dL — ABNORMAL HIGH (ref 70–99)
POTASSIUM: 4 meq/L (ref 3.7–5.3)
Sodium: 131 mEq/L — ABNORMAL LOW (ref 137–147)
Total Bilirubin: <0.2 mg/dL — ABNORMAL LOW (ref 0.3–1.2)
Total Protein: 6.6 g/dL (ref 6.0–8.3)

## 2013-10-19 LAB — URINALYSIS, ROUTINE W REFLEX MICROSCOPIC
BILIRUBIN URINE: NEGATIVE
Glucose, UA: >1000 mg/dL — AB
Ketones, ur: NEGATIVE mg/dL
NITRITE: NEGATIVE
Protein, ur: NEGATIVE mg/dL
SPECIFIC GRAVITY, URINE: 1.023 (ref 1.005–1.030)
UROBILINOGEN UA: 0.2 mg/dL (ref 0.0–1.0)
pH: 5 (ref 5.0–8.0)

## 2013-10-19 LAB — URINE MICROSCOPIC-ADD ON

## 2013-10-19 LAB — CBC
HEMATOCRIT: 33 % — AB (ref 36.0–46.0)
Hemoglobin: 11.8 g/dL — ABNORMAL LOW (ref 12.0–15.0)
MCH: 30.7 pg (ref 26.0–34.0)
MCHC: 35.8 g/dL (ref 30.0–36.0)
MCV: 85.9 fL (ref 78.0–100.0)
Platelets: 189 10*3/uL (ref 150–400)
RBC: 3.84 MIL/uL — ABNORMAL LOW (ref 3.87–5.11)
RDW: 11.7 % (ref 11.5–15.5)
WBC: 7.8 10*3/uL (ref 4.0–10.5)

## 2013-10-19 LAB — LIPASE, BLOOD: LIPASE: 19 U/L (ref 11–59)

## 2013-10-19 MED ORDER — IOHEXOL 300 MG/ML  SOLN
80.0000 mL | Freq: Once | INTRAMUSCULAR | Status: AC | PRN
  Administered 2013-10-19: 80 mL via INTRAVENOUS

## 2013-10-19 NOTE — ED Notes (Signed)
Patient discharged   Voiced understanding

## 2013-10-19 NOTE — Discharge Instructions (Signed)
Abdominal Pain, Women °Abdominal (stomach, pelvic, or belly) pain can be caused by many things. It is important to tell your doctor: °· The location of the pain. °· Does it come and go or is it present all the time? °· Are there things that start the pain (eating certain foods, exercise)? °· Are there other symptoms associated with the pain (fever, nausea, vomiting, diarrhea)? °All of this is helpful to know when trying to find the cause of the pain. °CAUSES  °· Stomach: virus or bacteria infection, or ulcer. °· Intestine: appendicitis (inflamed appendix), regional ileitis (Crohn's disease), ulcerative colitis (inflamed colon), irritable bowel syndrome, diverticulitis (inflamed diverticulum of the colon), or cancer of the stomach or intestine. °· Gallbladder disease or stones in the gallbladder. °· Kidney disease, kidney stones, or infection. °· Pancreas infection or cancer. °· Fibromyalgia (pain disorder). °· Diseases of the female organs: °¨ Uterus: fibroid (non-cancerous) tumors or infection. °¨ Fallopian tubes: infection or tubal pregnancy. °¨ Ovary: cysts or tumors. °¨ Pelvic adhesions (scar tissue). °¨ Endometriosis (uterus lining tissue growing in the pelvis and on the pelvic organs). °¨ Pelvic congestion syndrome (female organs filling up with blood just before the menstrual period). °¨ Pain with the menstrual period. °¨ Pain with ovulation (producing an egg). °¨ Pain with an IUD (intrauterine device, birth control) in the uterus. °¨ Cancer of the female organs. °· Functional pain (pain not caused by a disease, may improve without treatment). °· Psychological pain. °· Depression. °DIAGNOSIS  °Your doctor will decide the seriousness of your pain by doing an examination. °· Blood tests. °· X-rays. °· Ultrasound. °· CT scan (computed tomography, special type of X-ray). °· MRI (magnetic resonance imaging). °· Cultures, for infection. °· Barium enema (dye inserted in the large intestine, to better view it with  X-rays). °· Colonoscopy (looking in intestine with a lighted tube). °· Laparoscopy (minor surgery, looking in abdomen with a lighted tube). °· Major abdominal exploratory surgery (looking in abdomen with a large incision). °TREATMENT  °The treatment will depend on the cause of the pain.  °· Many cases can be observed and treated at home. °· Over-the-counter medicines recommended by your caregiver. °· Prescription medicine. °· Antibiotics, for infection. °· Birth control pills, for painful periods or for ovulation pain. °· Hormone treatment, for endometriosis. °· Nerve blocking injections. °· Physical therapy. °· Antidepressants. °· Counseling with a psychologist or psychiatrist. °· Minor or major surgery. °HOME CARE INSTRUCTIONS  °· Do not take laxatives, unless directed by your caregiver. °· Take over-the-counter pain medicine only if ordered by your caregiver. Do not take aspirin because it can cause an upset stomach or bleeding. °· Try a clear liquid diet (broth or water) as ordered by your caregiver. Slowly move to a bland diet, as tolerated, if the pain is related to the stomach or intestine. °· Have a thermometer and take your temperature several times a day, and record it. °· Bed rest and sleep, if it helps the pain. °· Avoid sexual intercourse, if it causes pain. °· Avoid stressful situations. °· Keep your follow-up appointments and tests, as your caregiver orders. °· If the pain does not go away with medicine or surgery, you may try: °¨ Acupuncture. °¨ Relaxation exercises (yoga, meditation). °¨ Group therapy. °¨ Counseling. °SEEK MEDICAL CARE IF:  °· You notice certain foods cause stomach pain. °· Your home care treatment is not helping your pain. °· You need stronger pain medicine. °· You want your IUD removed. °· You feel faint or   lightheaded. °· You develop nausea and vomiting. °· You develop a rash. °· You are having side effects or an allergy to your medicine. °SEEK IMMEDIATE MEDICAL CARE IF:  °· Your  pain does not go away or gets worse. °· You have a fever. °· Your pain is felt only in portions of the abdomen. The right side could possibly be appendicitis. The left lower portion of the abdomen could be colitis or diverticulitis. °· You are passing blood in your stools (bright red or black tarry stools, with or without vomiting). °· You have blood in your urine. °· You develop chills, with or without a fever. °· You pass out. °MAKE SURE YOU:  °· Understand these instructions. °· Will watch your condition. °· Will get help right away if you are not doing well or get worse. °Document Released: 11/21/2006 Document Revised: 06/10/2013 Document Reviewed: 12/11/2008 °ExitCare® Patient Information ©2015 [employer]. This information is not intended to replace advice given to you by your health care provider. Make sure you discuss any questions you have with your health care provider. ° °

## 2013-10-19 NOTE — ED Notes (Signed)
Pt c/o right lower abdominal pain "on and off for awhile." pt seen by PMD yesterday. Pt reports tylenol eases pain. Pt denies N/V.

## 2013-10-19 NOTE — ED Notes (Signed)
Patient finished Contrast. Paged CT.

## 2013-10-19 NOTE — ED Notes (Signed)
CT notified that the patient has finished her contrast

## 2013-10-20 NOTE — ED Provider Notes (Signed)
CSN: [id number]     Arrival date & time 10/19/13  1626 History   First MD Initiated Contact with Patient 10/19/13 1906     Chief Complaint  Patient presents with  . Abdominal Pain     (Consider location/radiation/quality/duration/timing/severity/associated sxs/prior Treatment) Patient is a 58 y.o. female presenting with abdominal pain. The history is provided by the patient.  Abdominal Pain Pain location:  RLQ, LLQ and suprapubic Pain quality: bloating and sharp   Pain radiates to:  Does not radiate Pain severity:  Moderate Onset quality:  Gradual Timing:  Constant Progression:  Unchanged Chronicity:  New Context: not eating and not laxative use   Relieved by:  Nothing Worsened by:  Nothing tried Associated symptoms: no cough, no fever, no shortness of breath and no vomiting     Past Medical History  Diagnosis Date  . Diabetes mellitus type II, uncontrolled   . Hypertension   . Hyperlipidemia   . Ovarian cyst, left   . Anemia     due to menorrhagia, BL 8-10  . Vaginal cyst     nabothian and bartholin  . CKD (chronic kidney disease) stage 3, GFR 30-59 ml/min     baseline creatinine 1.4-1.7  . Anxiety   . Depression   . Postmenopausal bleeding 06/12/2008  . 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 UTI (lower urinary tract infection)   . IBS (irritable bowel syndrome)   . Chronic back pain   . Chronic abdominal pain   . DDD (degenerative disc disease), lumbar   . Bilateral renal cysts 01/15/2009    Qualifier: Diagnosis of  By: Tyrell Antonio MD, Jerald Kief     Past Surgical History  Procedure Laterality Date  . Cardiac surgery      to repair congenital defect as a child   Family History  Problem Relation Age of Onset  . Stroke Father   . Heart attack Father     Had MI in his 2s  . Stomach cancer Paternal Grandmother   . Diabetes Maternal Grandmother   . Cerebral palsy Daughter   . Anesthesia problems Neg Hx   . Hypotension Neg Hx   . Malignant  hyperthermia Neg Hx   . Pseudochol deficiency Neg Hx    History  Substance Use Topics  . Smoking status: Never Smoker   . Smokeless tobacco: Former Systems developer  . Alcohol Use: No   OB History   Grav Para Term Preterm Abortions TAB SAB Ect Mult Living   _0 Review of Systems  Constitutional: Negative for fever.  Respiratory: Negative for cough and shortness of breath.   Gastrointestinal: Positive for abdominal pain. Negative for vomiting.  All other systems reviewed and are negative.     Allergies  Review of patient's allergies indicates no known allergies.  Home Medications   Prior to Admission medications   Medication Sig Start Date End Date Taking? Authorizing Provider  aspirin EC 81 MG tablet Take 81 mg by mouth daily.    Historical Provider, MD  Blood Glucose Monitoring Suppl (BLOOD GLUCOSE METER) kit Check your blood sugar at least once per day, before breakfast. ICD code 250.0 05/08/13   Blain Pais, MD  Enalapril-Hydrochlorothiazide 5-12.5 MG per tablet Take 1 tablet by mouth daily. 08/10/13   Clinton Gallant, MD  Insulin Detemir (LEVEMIR) 100 UNIT/ML Pen Inject 22 Units into the skin daily. ICD code 250.0, DM2  10/18/13   Ejiroghene Arlyce Dice, MD  Omega 3 1000 MG CAPS Take 1 capsule (1,000 mg total) by mouth 2 (two) times daily. 09/10/13   Ejiroghene Arlyce Dice, MD  simvastatin (ZOCOR) 40 MG tablet TAKE 1 TABLET (40 MG TOTAL) BY MOUTH AT BEDTIME. 01/27/13   [name], MD   BP 132/62  Pulse 80  Temp(Src) 98.3 F (36.8 C) (Oral)  Resp 16  Ht _0  (1.626 m)  Wt 163 lb (73.936 kg)  BMI 27.97 kg/m2  SpO2 95% Physical Exam  Nursing note and vitals reviewed. Constitutional: She is oriented to person, place, and time. She appears well-developed and well-nourished. No distress.  HENT:  Head: Normocephalic and atraumatic.  Mouth/Throat: Oropharynx is clear and moist.  Eyes: EOM are normal. Pupils are equal, round, and reactive to light.  Neck: Normal  range of motion. Neck supple.  Cardiovascular: Normal rate and regular rhythm.  Exam reveals no friction rub.   No murmur heard. Pulmonary/Chest: Effort normal and breath sounds normal. No respiratory distress. She has no wheezes. She has no rales.  Abdominal: Soft. She exhibits no distension. There is tenderness (mild, lower). There is no rebound.  Musculoskeletal: Normal range of motion. She exhibits no edema.  Neurological: She is alert and oriented to person, place, and time. No cranial nerve deficit. She exhibits normal muscle tone. Coordination normal.  Skin: No rash noted. She is not diaphoretic.    ED Course  Procedures (including critical care time) Labs Review Labs Reviewed  CBC - Abnormal; Notable for the following:    RBC 3.84 (*)    Hemoglobin 11.8 (*)    HCT 33.0 (*)    All other components within normal limits  COMPREHENSIVE METABOLIC PANEL - Abnormal; Notable for the following:    Sodium 131 (*)    Chloride 93 (*)    Glucose, Bld 455 (*)    BUN 27 (*)    Creatinine, Ser 1.40 (*)    Total Bilirubin <0.2 (*)    GFR calc non Af Amer 41 (*)    GFR calc Af Amer 48 (*)    All other components within normal limits  URINALYSIS, ROUTINE W REFLEX MICROSCOPIC - Abnormal; Notable for the following:    APPearance CLOUDY (*)    Glucose, UA >1000 (*)    Hgb urine dipstick SMALL (*)    Leukocytes, UA MODERATE (*)    All other components within normal limits  URINE MICROSCOPIC-ADD ON - Abnormal; Notable for the following:    Squamous Epithelial / LPF FEW (*)    Bacteria, UA FEW (*)    All other components within normal limits  LIPASE, BLOOD    Imaging Review Ct Abdomen Pelvis W Contrast  10/19/2013   CLINICAL DATA:  Right lower quadrant abdominal pain.  EXAM: CT ABDOMEN AND PELVIS WITH CONTRAST  TECHNIQUE: Multidetector CT imaging of the abdomen and pelvis was performed using the standard protocol following bolus administration of intravenous contrast.  CONTRAST:  50m  OMNIPAQUE IOHEXOL 300 MG/ML  SOLN  COMPARISON:  01/18/2013  FINDINGS: Lung bases:  Minimal dependent atelectasis. No pleural effusion or pulmonary lesion. The heart is within normal limits in size and stable. No pericardial effusion. The distal esophagus is grossly normal.  CT abdomen:  The liver is unremarkable. No focal lesions or biliary dilatation. The gallbladder is normal. No common bile duct dilatation. The pancreas is unremarkable. Moderate to advanced atrophy of the body and tail is noted. The spleen is  normal in size. No focal lesions. The adrenal glands and kidneys are unremarkable. No inflammatory changes or hydronephrosis. Stable appearing right renal cysts complicated by septations and calcification. Other slightly hyperdense renal cysts are noted but appears stable. Recommend continued surveillance.  The stomach, duodenum, small bowel and colon are unremarkable. No inflammatory changes, mass lesions or obstructive findings. The appendix is normal. No mesenteric or retroperitoneal mass or adenopathy. The aorta and branch vessels are normal and stable. The major venous structures are patent.  CT pelvis:  Stable septate uterus. The ovaries are normal. No pelvic mass or adenopathy. The bladder is normal. No free pelvic fluid collections. No inguinal mass or adenopathy.  The bony structures are intact.  Stable degenerative changes.  IMPRESSION: No acute abdominal/ pelvic findings, mass lesions or adenopathy.  Overall stable complex bilateral renal cysts. Recommend continued surveillance.  Septate uterus.   Electronically Signed   By: Kalman Jewels M.D.   On: 10/19/2013 21:20     EKG Interpretation None      MDM   Final diagnoses:  Lower abdominal pain    70F here with lower abdominal pain. Present for past few days, thinks it's related to her IBS. Denies fevers, vomiting, diarrhea. No dysuria. Mild diffuse lower abdominal pain on exam. CT negative. Urine shows UTI, missed on initial visit,  I spoke with patient and called in Rx to her local pharmacy.      Evelina Bucy, MD 10/20/13 [phone]

## 2013-10-21 ENCOUNTER — Ambulatory Visit: Payer: No Typology Code available for payment source | Admitting: Internal Medicine

## 2013-10-22 ENCOUNTER — Ambulatory Visit (HOSPITAL_COMMUNITY): Payer: No Typology Code available for payment source

## 2013-10-22 ENCOUNTER — Encounter: Payer: No Typology Code available for payment source | Admitting: Internal Medicine

## 2013-10-30 ENCOUNTER — Ambulatory Visit (HOSPITAL_COMMUNITY)
Admission: RE | Admit: 2013-10-30 | Discharge: 2013-10-30 | Disposition: A | Discharge status: Home / Self Care | Payer: No Typology Code available for payment source | Source: Ambulatory Visit | Attending: Internal Medicine | Admitting: Internal Medicine

## 2013-10-30 DIAGNOSIS — Z1231 Encounter for screening mammogram for malignant neoplasm of breast: Secondary | ICD-10-CM | POA: Insufficient documentation

## 2013-11-01 ENCOUNTER — Encounter: Payer: Self-pay | Admitting: Internal Medicine

## 2013-11-01 ENCOUNTER — Ambulatory Visit (INDEPENDENT_AMBULATORY_CARE_PROVIDER_SITE_OTHER): Payer: No Typology Code available for payment source | Admitting: Internal Medicine

## 2013-11-01 VITALS — BP 139/67 | HR 93 | Temp 98.9°F | Ht 64.0 in | Wt 162.5 lb

## 2013-11-01 DIAGNOSIS — N189 Chronic kidney disease, unspecified: Secondary | ICD-10-CM

## 2013-11-01 DIAGNOSIS — E1129 Type 2 diabetes mellitus with other diabetic kidney complication: Secondary | ICD-10-CM

## 2013-11-01 DIAGNOSIS — E1122 Type 2 diabetes mellitus with diabetic chronic kidney disease: Secondary | ICD-10-CM

## 2013-11-01 LAB — GLUCOSE, CAPILLARY: GLUCOSE-CAPILLARY: 456 mg/dL — AB (ref 70–99)

## 2013-11-01 MED ORDER — INSULIN DETEMIR 100 UNIT/ML FLEXPEN: 25.0000 [IU] | PEN_INJECTOR | Freq: Every day | SUBCUTANEOUS | Status: DC

## 2013-11-01 MED ORDER — INSULIN DETEMIR 100 UNIT/ML FLEXPEN: 22.0000 [IU] | PEN_INJECTOR | Freq: Every day | SUBCUTANEOUS | Status: DC

## 2013-11-01 NOTE — Progress Notes (Signed)
Patient ID: [name], female   DOB: [birth year], 58 y.o.   MRN: [id number]   Subjective:   Patient ID: [name] female   DOB: [birth year] 58 y.o.   MRN: [id number]  HPI: [name] is a 58 y.o. with PMH listed below. Pt presented today for follow up visit for her diabetes.  Past Medical History  Diagnosis Date  . Diabetes mellitus type II, uncontrolled   . Hypertension   . Hyperlipidemia   . Ovarian cyst, left   . Anemia     due to menorrhagia, BL 8-10  . Vaginal cyst     nabothian and bartholin  . CKD (chronic kidney disease) stage 3, GFR 30-59 ml/min     baseline creatinine 1.4-1.7  . Anxiety   . Depression   . Postmenopausal bleeding 06/12/2008  . 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 UTI (lower urinary tract infection)   . IBS (irritable bowel syndrome)   . Chronic back pain   . Chronic abdominal pain   . DDD (degenerative disc disease), lumbar   . Bilateral renal cysts 01/15/2009    Qualifier: Diagnosis of  By: Tyrell Antonio MD, [place]     Current Outpatient Prescriptions  Medication Sig Dispense Refill  . aspirin EC 81 MG tablet Take 81 mg by mouth daily.      . Blood Glucose Monitoring Suppl (BLOOD GLUCOSE METER) kit Check your blood sugar at least once per day, before breakfast. ICD code 250.0  1 each  0  . Enalapril-Hydrochlorothiazide 5-12.5 MG per tablet Take 1 tablet by mouth daily.  30 tablet  1  . Insulin Detemir (LEVEMIR) 100 UNIT/ML Pen Inject 22 Units into the skin daily. ICD code 250.0, DM2  15 mL  11  . Omega 3 1000 MG CAPS Take 1 capsule (1,000 mg total) by mouth 2 (two) times daily.  90 each  1  . simvastatin (ZOCOR) 40 MG tablet TAKE 1 TABLET (40 MG TOTAL) BY MOUTH AT BEDTIME.  30 tablet  6  . [DISCONTINUED] pantoprazole (PROTONIX) 20 MG tablet Take 2 tablets (40 mg total) by mouth daily.  30 tablet  1  . [DISCONTINUED] sertraline (ZOLOFT) 100 MG tablet Take 1 tablet (100 mg total) by mouth daily.  30 tablet  2   No  current facility-administered medications for this visit.   Family History  Problem Relation Age of Onset  . Stroke Father   . Heart attack Father     Had MI in his 33s  . Stomach cancer Paternal Grandmother   . Diabetes Maternal Grandmother   . Cerebral palsy Daughter   . Anesthesia problems Neg Hx   . Hypotension Neg Hx   . Malignant hyperthermia Neg Hx   . Pseudochol deficiency Neg Hx    History   Social History  . Marital Status: Divorced    Spouse Name: N/A    Number of Children: 1  . Years of Education: 12th grade   Occupational History  . 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 Smoking status: Never Smoker   . Smokeless tobacco: Former Systems developer  . Alcohol Use: No  . Drug Use: No  . Sexual Activity: None   Other Topics Concern  . 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   Review of Systems: CONSTITUTIONAL- No Fever, weightloss, night sweat or change in appetite. SKIN- No Rash,  colour changes or itching. HEAD- No Headache or dizziness. EYES- No Vision loss, pain, redness, double or blurred vision. EARS- No vertigo, hearing loss or ear discharge. Mouth/throat- No Sorethroat, dentures, or bleeding gums. RESPIRATORY- No Cough or SOB. CARDIAC- No Palpitations, DOE, PND or chest pain. GI- No nausea, vomiting, diarrhoea, constipation, abd pain. URINARY- No Frequency, urgency, straining or dysuria. NEUROLOGIC- Has Numbness- soles of bilat feet only, no syncope, seizures or burning. [facility]- Denies depression or anxiety.  Objective:  Physical Exam: Filed Vitals:   11/01/13 1504  BP: 139/67  Pulse: 93  Temp: 98.9 F (37.2 C)  TempSrc: Oral  Height: 5' 4"  (1.626 m)  Weight: 162 lb 8 oz (73.71 kg)  SpO2: 99%   GENERAL- alert, co-operative, appears as stated age, not in any distress. HEENT- Atraumatic, normocephalic, PERRL, EOMI, oral mucosa appears moist, no cervical LN enlargement, thyroid does  not appear enlarged, neck supple. CARDIAC- RRR, no murmurs, rubs or gallops. RESP- Moving equal volumes of air, and clear to auscultation bilaterally, no wheezes or crackles. ABDOMEN- Soft, nontender,  no palpable masses or organomegaly, bowel sounds present. BACK- Normal curvature of the spine, No tenderness along the vertebrae, no CVA tenderness. NEURO- No obvious Cr N abnormality, strenght upper and lower extremities-  intact, Gait- Normal EXTREMITIES- pulse 2+, symmetric, no pedal edema, DTR- 2+- achilles tendon, Sensation intact, position sense intact. SKIN- Warm, dry, No rash or lesion. PSYCH- Normal mood and affect, appropriate thought content and speech.  Assessment & Plan:   The patient's case and plan of care was discussed with attending physician, Dr. Lynnae January.  Please see problem based charting for assessment and plan.

## 2013-11-01 NOTE — Patient Instructions (Addendum)
General Instructions:   We will be increasing your insulin to 25u daily. Please watch the meals you eat. We want you to talk to Butch Penny our diabetes co-ordinator.   We will see you in 1 month to access your blood sugars. Please bring your glucometer along and check your blood sugars once a day.  Thank you for bringing your medicines today. This helps [place] keep you safe from mistakes.  By your next visit we will consider adding Pioglitazone/actos to your medication regimen.  It was nice seeing you today.    Diabetes Mellitus and Food It is important for you to manage your blood sugar (glucose) level. Your blood glucose level can be greatly affected by what you eat. Eating healthier foods in the appropriate amounts throughout the day at about the same time each day will help you control your blood glucose level. It can also help slow or prevent worsening of your diabetes mellitus. Healthy eating may even help you improve the level of your blood pressure and reach or maintain a healthy weight.  HOW CAN FOOD AFFECT ME? Carbohydrates Carbohydrates affect your blood glucose level more than any other type of food. Your dietitian will help you determine how many carbohydrates to eat at each meal and teach you how to count carbohydrates. Counting carbohydrates is important to keep your blood glucose at a healthy level, especially if you are using insulin or taking certain medicines for diabetes mellitus. Alcohol Alcohol can cause sudden decreases in blood glucose (hypoglycemia), especially if you use insulin or take certain medicines for diabetes mellitus. Hypoglycemia can be a life-threatening condition. Symptoms of hypoglycemia (sleepiness, dizziness, and disorientation) are similar to symptoms of having too much alcohol.  If your health care provider has given you approval to drink alcohol, do so in moderation and use the following guidelines:  Women should not have more than one drink per day, and men  should not have more than two drinks per day. One drink is equal to:  12 oz of beer.  5 oz of wine.  1 oz of hard liquor.  Do not drink on an empty stomach.  Keep yourself hydrated. Have water, diet soda, or unsweetened iced tea.  Regular soda, juice, and other mixers might contain a lot of carbohydrates and should be counted. WHAT FOODS ARE NOT RECOMMENDED? As you make food choices, it is important to remember that all foods are not the same. Some foods have fewer nutrients per serving than other foods, even though they might have the same number of calories or carbohydrates. It is difficult to get your body what it needs when you eat foods with fewer nutrients. Examples of foods that you should avoid that are high in calories and carbohydrates but low in nutrients include:  Trans fats (most processed foods list trans fats on the Nutrition Facts label).  Regular soda.  Juice.  Candy.  Sweets, such as cake, pie, doughnuts, and cookies.  Fried foods. WHAT FOODS CAN I EAT? Have nutrient-rich foods, which will nourish your body and keep you healthy. The food you should eat also will depend on several factors, including:  The calories you need.  The medicines you take.  Your weight.  Your blood glucose level.  Your blood pressure level.  Your cholesterol level. You also should eat a variety of foods, including:  Protein, such as meat, poultry, fish, tofu, nuts, and seeds (lean animal proteins are best).  Fruits.  Vegetables.  Dairy products, such as milk, cheese,  and yogurt (low fat is best).  Breads, grains, pasta, cereal, rice, and beans.  Fats such as olive oil, trans fat-free margarine, canola oil, avocado, and olives. DOES EVERYONE WITH DIABETES MELLITUS HAVE THE SAME MEAL PLAN? Because every person with diabetes mellitus is different, there is not one meal plan that works for everyone. It is very important that you meet with a dietitian who will help you  create a meal plan that is just right for you.

## 2013-11-01 NOTE — Assessment & Plan Note (Addendum)
Lab Results  Component Value Date   HGBA1C 8.8 09/10/2013   HGBA1C 13.3 05/08/2013   HGBA1C 12.6 01/25/2013     Assessment: Diabetes control:  uncontrolled Progress toward A1C goal:   improved           Comments: Still with persistent hyperglycemia. Pt complaint with 22u of insulin daily. Pt asking to be placed on actos. Metformin d/cd as renal function not stable. Blood sugars checked since last visit- 10/18/2013, 8 readings for 6 different days. Pt wakes up in the afternoon and checks her sugars then- readings- 252- 476. No signs of infection. Was in th Ed for abd pain- UTI, was treated with Bactrim, today she says the abdominal pain has resolved, denies dysuria. Pt has been on 22u daily of levemir.  Plan: Medications:  Increase Levemir to 25u daily. Home glucose monitoring: Frequency:   Timing:   Instruction/counseling given: refer to [name] for counselling on diet. Pt wants to work on that. Educational resources provided: brochure Self management tools provided: copy of home glucose meter download;home glucose logbook Other plans: Consider starting actos next visit, pending CBG readings in 1 month.  - Filled paper work for [employer] for pt to get Insulin. - foot exam next appointment. - Up to date on all her screenings.

## 2013-11-02 ENCOUNTER — Telehealth: Payer: Self-pay | Admitting: Internal Medicine

## 2013-11-02 NOTE — Progress Notes (Signed)
[facility] Attending  Case discussed with Dr. Emokpae at the time of the visit.  We reviewed the resident's history and exam and pertinent patient test results.  I agree with the assessment, diagnosis, and plan of care documented in the resident's note.  

## 2013-11-02 NOTE — Telephone Encounter (Signed)
Pt doing 26 units Levemir cbg reading 400s yesterday, 362 today.  Ate [place] sausage this am on wheat muffin and banana.  Reports funny "warm" feeling in her stomach w/o radiation.  She denies nausea/vomiting. She just finished Abx for UTI a month ago so does not think it is UTI.  She denies dysuria.  Pt intends to go to the Urgent Care to get bladder checked out and get hyperglycemia advise. She is asking for short acting meal time coverage or to be restarted on Actos or Glipizide.  Will have pt f/u next week to address. Advised she can try 30 units levemir qhs      Aundra Dubin MD

## 2013-11-04 ENCOUNTER — Encounter (HOSPITAL_COMMUNITY): Payer: Self-pay | Admitting: Emergency Medicine

## 2013-11-04 ENCOUNTER — Encounter: Payer: Self-pay | Admitting: *Deleted

## 2013-11-04 ENCOUNTER — Other Ambulatory Visit: Payer: Self-pay | Admitting: Internal Medicine

## 2013-11-04 ENCOUNTER — Emergency Department (INDEPENDENT_AMBULATORY_CARE_PROVIDER_SITE_OTHER)
Admission: EM | Admit: 2013-11-04 | Discharge: 2013-11-04 | Disposition: A | Discharge status: Home / Self Care | Payer: No Typology Code available for payment source | Source: Home / Self Care | Attending: Family Medicine | Admitting: Family Medicine

## 2013-11-04 ENCOUNTER — Other Ambulatory Visit: Payer: Self-pay | Admitting: *Deleted

## 2013-11-04 DIAGNOSIS — R3589 Other polyuria: Secondary | ICD-10-CM

## 2013-11-04 DIAGNOSIS — R358 Other polyuria: Secondary | ICD-10-CM

## 2013-11-04 DIAGNOSIS — E1122 Type 2 diabetes mellitus with diabetic chronic kidney disease: Secondary | ICD-10-CM

## 2013-11-04 LAB — POCT URINALYSIS DIP (DEVICE)
Bilirubin Urine: NEGATIVE
GLUCOSE, UA: 500 mg/dL — AB
HGB URINE DIPSTICK: NEGATIVE
Ketones, ur: NEGATIVE mg/dL
NITRITE: NEGATIVE
PH: 5.5 (ref 5.0–8.0)
Protein, ur: NEGATIVE mg/dL
Specific Gravity, Urine: <=1.005 (ref 1.005–1.030)
UROBILINOGEN UA: 0.2 mg/dL (ref 0.0–1.0)

## 2013-11-04 MED ORDER — INSULIN DETEMIR 100 UNIT/ML FLEXPEN
25.0000 [IU] | PEN_INJECTOR | Freq: Every day | SUBCUTANEOUS | Status: DC
Start: 2013-11-04 — End: 2013-11-05

## 2013-11-04 MED ORDER — ENALAPRIL-HYDROCHLOROTHIAZIDE 5-12.5 MG PO TABS: 1.0000 | ORAL_TABLET | Freq: Every day | ORAL | Status: DC

## 2013-11-04 NOTE — ED Provider Notes (Signed)
CSN: [id number]     Arrival date & time 11/04/13  1712 History   First MD Initiated Contact with Patient 11/04/13 1719     Chief Complaint  Patient presents with  . Urinary Tract Infection   (Consider location/radiation/quality/duration/timing/severity/associated sxs/prior Treatment) Patient is a 58 y.o. female presenting with urinary tract infection. The history is provided by the patient.  Urinary Tract Infection This is a recurrent problem. The current episode started 2 days ago. The problem has been gradually worsening. Pertinent negatives include no abdominal pain. Associated symptoms comments: Polyuria from diabetes poorly controlled. meds recently adjusted..    Past Medical History  Diagnosis Date  . Diabetes mellitus type II, uncontrolled   . Hypertension   . Hyperlipidemia   . Ovarian cyst, left   . Anemia     due to menorrhagia, BL 8-10  . Vaginal cyst     nabothian and bartholin  . CKD (chronic kidney disease) stage 3, GFR 30-59 ml/min     baseline creatinine 1.4-1.7  . Anxiety   . Depression   . Postmenopausal bleeding 06/12/2008  . 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 UTI (lower urinary tract infection)   . IBS (irritable bowel syndrome)   . Chronic back pain   . Chronic abdominal pain   . DDD (degenerative disc disease), lumbar   . Bilateral renal cysts 01/15/2009    Qualifier: Diagnosis of  By: Tyrell Antonio MD, Jerald Kief     Past Surgical History  Procedure Laterality Date  . Cardiac surgery      to repair congenital defect as a child   Family History  Problem Relation Age of Onset  . Stroke Father   . Heart attack Father     Had MI in his 73s  . Stomach cancer Paternal Grandmother   . Diabetes Maternal Grandmother   . Cerebral palsy Daughter   . Anesthesia problems Neg Hx   . Hypotension Neg Hx   . Malignant hyperthermia Neg Hx   . Pseudochol deficiency Neg Hx    History  Substance Use Topics  . Smoking status: Never Smoker    . Smokeless tobacco: Former Systems developer  . Alcohol Use: No   OB History   Grav Para Term Preterm Abortions TAB SAB Ect Mult Living   3 1 1  2  2   1      Review of Systems  Constitutional: Negative.   Gastrointestinal: Negative.  Negative for abdominal pain.  Endocrine: Positive for polyuria.  Genitourinary: Negative.     Allergies  Review of patient's allergies indicates no known allergies.  Home Medications   Prior to Admission medications   Medication Sig Start Date End Date Taking? Authorizing Provider  aspirin EC 81 MG tablet Take 81 mg by mouth daily.    Historical Provider, MD  Blood Glucose Monitoring Suppl (BLOOD GLUCOSE METER) kit Check your blood sugar at least once per day, before breakfast. ICD code 250.0 05/08/13   Blain Pais, MD  Enalapril-Hydrochlorothiazide 5-12.5 MG per tablet Take 1 tablet by mouth daily. 11/04/13   Ejiroghene Arlyce Dice, MD  Insulin Detemir (LEVEMIR) 100 UNIT/ML Pen Inject 25 Units into the skin daily. ICD code 250.0, DM2 11/04/13   [name], MD  Omega 3 1000 MG CAPS Take 1 capsule (1,000 mg total) by mouth 2 (two) times daily. 09/10/13   Ejiroghene Arlyce Dice, MD  simvastatin (ZOCOR) 40 MG tablet TAKE 1 TABLET (40 MG TOTAL) BY  MOUTH AT BEDTIME. 01/27/13   [name], MD   BP 131/75  Pulse 80  Temp(Src) 98.7 F (37.1 C) (Oral)  Resp 16  SpO2 98% Physical Exam  Nursing note and vitals reviewed. Constitutional: She is oriented to person, place, and time. She appears well-developed and well-nourished.  Abdominal: Soft. Bowel sounds are normal.  Neurological: She is alert and oriented to person, place, and time.  Skin: Skin is warm and dry.    ED Course  Procedures (including critical care time) Labs Review Labs Reviewed  POCT URINALYSIS DIP (DEVICE) - Abnormal; Notable for the following:    Glucose, UA 500 (*)    Leukocytes, UA TRACE (*)    All other components within normal limits    Imaging Review No results  found.   MDM   1. Polyuria        Billy Fischer, MD 11/04/13 Carollee Massed

## 2013-11-04 NOTE — ED Notes (Signed)
C/o frequent urination, concerned she has a UTI. C/o the [facility] would not check her UA, and she needs medication for her sugar

## 2013-11-04 NOTE — Discharge Instructions (Signed)
Continue monitoring your sugar, see your doctor as planned.

## 2013-11-05 ENCOUNTER — Encounter: Payer: Self-pay | Admitting: Internal Medicine

## 2013-11-05 ENCOUNTER — Ambulatory Visit (INDEPENDENT_AMBULATORY_CARE_PROVIDER_SITE_OTHER): Payer: No Typology Code available for payment source | Admitting: Internal Medicine

## 2013-11-05 ENCOUNTER — Telehealth: Payer: Self-pay | Admitting: *Deleted

## 2013-11-05 VITALS — BP 154/79 | HR 91 | Temp 98.2°F | Ht 64.0 in | Wt 160.6 lb

## 2013-11-05 DIAGNOSIS — E1122 Type 2 diabetes mellitus with diabetic chronic kidney disease: Secondary | ICD-10-CM

## 2013-11-05 DIAGNOSIS — E1149 Type 2 diabetes mellitus with other diabetic neurological complication: Secondary | ICD-10-CM

## 2013-11-05 DIAGNOSIS — N189 Chronic kidney disease, unspecified: Secondary | ICD-10-CM

## 2013-11-05 DIAGNOSIS — N3000 Acute cystitis without hematuria: Secondary | ICD-10-CM

## 2013-11-05 DIAGNOSIS — E1129 Type 2 diabetes mellitus with other diabetic kidney complication: Secondary | ICD-10-CM

## 2013-11-05 LAB — TSH: TSH: 4.048 u[IU]/mL (ref 0.350–4.500)

## 2013-11-05 MED ORDER — PIOGLITAZONE HCL 15 MG PO TABS: 15.0000 mg | ORAL_TABLET | Freq: Every day | ORAL | Status: DC

## 2013-11-05 MED ORDER — INSULIN DETEMIR 100 UNIT/ML FLEXPEN: 30.0000 [IU] | PEN_INJECTOR | Freq: Every day | SUBCUTANEOUS | Status: DC

## 2013-11-05 NOTE — Patient Instructions (Addendum)
General Instructions:   We will be prescribing the medication called Actos, take one tablet once a day. Since you are starting this medication, continue with 30u of insulin, every day. Take your insulin in the morning and check your blood sugars in the morning before eating. Please bring your glucometer next visit in 2 weeks.   Please bring your medicines with you each time you come to clinic.  Medicines may include prescription medications, over-the-counter medications, herbal remedies, eye drops, vitamins, or other pills.    Menopause Menopause is the normal time of life when menstrual periods stop completely. Menopause is complete when you have missed 12 consecutive menstrual periods. It usually occurs between the ages of 73 years and 26 years. Very rarely does a woman develop menopause before the age of 27 years. At menopause, your ovaries stop producing the female hormones estrogen and progesterone. This can cause undesirable symptoms and also affect your health. Sometimes the symptoms may occur 4-5 years before the menopause begins. There is no relationship between menopause and:  Oral contraceptives.  Number of children you had.  Race.  The age your menstrual periods started (menarche). Heavy smokers and very thin women may develop menopause earlier in life. CAUSES  The ovaries stop producing the female hormones estrogen and progesterone.  Other causes include:  Surgery to remove both ovaries.  The ovaries stop functioning for no known reason.  Tumors of the pituitary gland in the brain.  Medical disease that affects the ovaries and hormone production.  Radiation treatment to the abdomen or pelvis.  Chemotherapy that affects the ovaries. SYMPTOMS   Hot flashes.  Night sweats.  Decrease in sex drive.  Vaginal dryness and thinning of the vagina causing painful intercourse.  Dryness of the skin and developing  wrinkles.  Headaches.  Tiredness.  Irritability.  Memory problems.  Weight gain.  Bladder infections.  Hair growth of the face and chest.  Infertility. More serious symptoms include:  Loss of bone (osteoporosis) causing breaks (fractures).  Depression.  Hardening and narrowing of the arteries (atherosclerosis) causing heart attacks and strokes. DIAGNOSIS   When the menstrual periods have stopped for 12 straight months.  Physical exam.  Hormone studies of the blood. TREATMENT  There are many treatment choices and nearly as many questions about them. The decisions to treat or not to treat menopausal changes is an individual choice made with your health care provider. Your health care provider can discuss the treatments with you. Together, you can decide which treatment will work best for you. Your treatment choices may include:   Hormone therapy (estrogen and progesterone).  Non-hormonal medicines.  Treating the individual symptoms with medicine (for example antidepressants for depression).  Herbal medicines that may help specific symptoms.  Counseling by a psychiatrist or psychologist.  Group therapy.  Lifestyle changes including:  Eating healthy.  Regular exercise.  Limiting caffeine and alcohol.  Stress management and meditation.  No treatment. HOME CARE INSTRUCTIONS   Take the medicine your health care provider gives you as directed.  Get plenty of sleep and rest.  Exercise regularly.  Eat a diet that contains calcium (good for the bones) and soy products (acts like estrogen hormone).  Avoid alcoholic beverages.  Do not smoke.  If you have hot flashes, dress in layers.  Take supplements, calcium, and vitamin D to strengthen bones.  You can use over-the-counter lubricants or moisturizers for vaginal dryness.  Group therapy is sometimes very helpful.  Acupuncture may be helpful in some cases.  SEEK MEDICAL CARE IF:   You are not sure  you are in menopause.  You are having menopausal symptoms and need advice and treatment.  You are still having menstrual periods after age 65 years.  You have pain with intercourse.  Menopause is complete (no menstrual period for 12 months) and you develop vaginal bleeding.  You need a referral to a specialist (gynecologist, psychiatrist, or psychologist) for treatment. SEEK IMMEDIATE MEDICAL CARE IF:   You have severe depression.  You have excessive vaginal bleeding.  You fell and think you have a broken bone.  You have pain when you urinate.  You develop leg or chest pain.  You have a fast pounding heart beat (palpitations).  You have severe headaches.  You develop vision problems.  You feel a lump in your breast.  You have abdominal pain or severe indigestion.

## 2013-11-05 NOTE — Progress Notes (Signed)
Patient ID: [name], female   DOB: [birth year], 58 y.o.   MRN: [id number]   Subjective:   Patient ID: [name] female   DOB: 1955/05/27 58 y.o.   MRN: [id number]  HPI: [name] is a 58 y.o. with PMH of HTN, DM, CKD- 3. Presented today with complaints that she is not happy wityh her medication, wants either the short acting alone, or the actos- which was discont about 5 months ago. Says since she started the levemir she has been sweating, several times a day. Could not answer if she is having night sweats, no diarrhea heat or cold intolerance, no skin changes.  [name], no change in bowel harbits, no pain, pt totally denies any dysuria or frequency today.  Past Medical History  Diagnosis Date  . Diabetes mellitus type II, uncontrolled   . Hypertension   . Hyperlipidemia   . Ovarian cyst, left   . Anemia     due to menorrhagia, BL 8-10  . Vaginal cyst     nabothian and bartholin  . CKD (chronic kidney disease) stage 3, GFR 30-59 ml/min     baseline creatinine 1.4-1.7  . Anxiety   . Depression   . Postmenopausal bleeding 06/12/2008  . 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 UTI (lower urinary tract infection)   . IBS (irritable bowel syndrome)   . Chronic back pain   . Chronic abdominal pain   . DDD (degenerative disc disease), lumbar   . Bilateral renal cysts 01/15/2009    Qualifier: Diagnosis of  By: Tyrell Antonio MD, [place]     Current Outpatient Prescriptions  Medication Sig Dispense Refill  . aspirin EC 81 MG tablet Take 81 mg by mouth daily.      . Blood Glucose Monitoring Suppl (BLOOD GLUCOSE METER) kit Check your blood sugar at least once per day, before breakfast. ICD code 250.0  1 each  0  . Enalapril-Hydrochlorothiazide 5-12.5 MG per tablet Take 1 tablet by mouth daily.  30  tablet  6  . Insulin Detemir (LEVEMIR) 100 UNIT/ML Pen Inject 25 Units into the skin daily. ICD code 250.0, DM2  15 mL  11  . Omega 3 1000 MG CAPS Take 1 capsule (1,000 mg total) by mouth 2 (two) times daily.  90 each  1  . simvastatin (ZOCOR) 40 MG tablet TAKE 1 TABLET (40 MG TOTAL) BY MOUTH AT BEDTIME.  30 tablet  6  . [DISCONTINUED] pantoprazole (PROTONIX) 20 MG tablet Take 2 tablets (40 mg total) by mouth daily.  30 tablet  1  . [DISCONTINUED] sertraline (ZOLOFT) 100 MG tablet Take 1 tablet (100 mg total) by mouth daily.  30 tablet  2   No current facility-administered medications for this visit.   Family History  Problem Relation Age of Onset  . Stroke Father   . Heart attack Father     Had MI in his 2s  . Stomach cancer Paternal Grandmother   . Diabetes Maternal Grandmother   . Cerebral palsy Daughter   . Anesthesia problems Neg Hx   . Hypotension Neg Hx   . Malignant hyperthermia Neg Hx   . Pseudochol deficiency Neg Hx    History   Social History  . Marital Status: Divorced    Spouse  Name: N/A    Number of Children: 1  . Years of Education: 12th grade   Occupational History  . 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 Smoking status: Never Smoker   . Smokeless tobacco: Former Systems developer  . Alcohol Use: No  . Drug Use: No  . Sexual Activity: None   Other Topics Concern  . 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   Review of Systems: CONSTITUTIONAL- No Fever, weightloss, night sweat or change in appetite. SKIN- No Rash, colour changes or itching. HEAD- No Headache or dizziness. Mouth/throat- No Sorethroat, dentures, or bleeding gums. RESPIRATORY- No Cough or SOB. CARDIAC- No Palpitations, DOE, PND or chest pain. GI- No nausea, vomiting, diarrhoea, constipation, abd pain. URINARY- No Frequency, urgency, straining or dysuria. NEUROLOGIC- No Numbness, syncope, seizures or burning. [facility]- Denies  depression or anxiety.  Objective:  Physical Exam: Filed Vitals:   11/05/13 1550  BP: 154/79  Pulse: 91  Temp: 98.2 F (36.8 C)  TempSrc: Oral  Height: _0  (1.626 m)  Weight: 160 lb 9.6 oz (72.848 kg)  SpO2: 97%   GENERAL- alert, co-operative, appears as stated age, not in any distress. HEENT- Atraumatic, normocephalic, PERRL,  neck supple. CARDIAC- RRR, no murmurs, rubs or gallops. RESP- Moving equal volumes of air, and clear to auscultation bilaterally, no wheezes or crackles. ABDOMEN- Soft, nontender, no guarding or rebound, no palpable masses or organomegaly, bowel sounds present. NEURO- No obvious Cr N abnormality, strenght upper and lower extremities- 5/5, Gait- Normal. EXTREMITIES- pulse 2+, symmetric, no pedal edema. SKIN- Warm, dry, No rash or lesion. PSYCH- Normal mood and affect, appropriate thought content and speech.  Assessment & Plan:   The patient's case and plan of care was discussed with attending physician, Dr. Dareen Piano.  Please see problem based charting for assessment and plan.

## 2013-11-05 NOTE — Telephone Encounter (Signed)
Pt calls and states she is upset with her new medication for diabetes, states she is having increased blood sugars, increased sweating and is stressed about all of this, appt w/ pcp today 1545

## 2013-11-05 NOTE — Assessment & Plan Note (Addendum)
Lab Results  Component Value Date   HGBA1C 8.8 09/10/2013   HGBA1C 13.3 05/08/2013   HGBA1C 12.6 01/25/2013     Assessment: Diabetes control:  Uncontrolled Progress toward A1C goal:   Not at goal Comments: Pt reports compliance with now 30units of insulin( Increased after she called the on call pager- 11/02/2013, after her insulin was just adjusted, from 22 to 25- the day prior). Pt brought her glucometer today, 5 readings since- last visit, all range between- 225 and 387). Todays reading 387, has not taken insulin today. Pt admits to dietary indiscretion. Pt has taken 2 days of 30u of insulin, blood sugars still elevated. Pt is requesting to be placed on Actos. Pt also complaining of intermittent episodes of diaphoresis. Pt not ready to start Novolog, rejects the idea, when she is told she will have to check blood sugars with each meal and inject accordingly. Pt finding it hard to check blood sugars once a day.    Plan: Medications:  Start Actos- 15u daily, and continue insulin 30u daily. Home glucose monitoring: Frequency:   Timing:   Instruction/counseling given: bring log to next visit, check fasting blood sugars. Educational resources provided:   Self management tools provided: copy of home glucose meter download Other plans: See in 2 weeks, access control- tolerance of actos and insulin.  If blood sugars still uncontrolled, pt to seriously consider instituting short acting insulin. -  TSH- for diaphoresis. - Levemir can cause diaphoresis, but pt gets this medication free, will rule out other causes. Pt is also menopausal, and this is most likely related to night sweats. - Urine culture ( At least 2 Ed visits in the past year for UTI).

## 2013-11-05 NOTE — Progress Notes (Signed)
INTERNAL MEDICINE TEACHING ATTENDING ADDENDUM - Cythia Bachtel, MD: I reviewed and discussed at the time of visit with the resident Dr. Emokpae, the patient's medical history, physical examination, diagnosis and results of pertinent tests and treatment and I agree with the patient's care as documented.  

## 2013-11-06 ENCOUNTER — Telehealth: Payer: Self-pay | Admitting: Internal Medicine

## 2013-11-06 LAB — URINE CULTURE
COLONY COUNT: NO GROWTH
ORGANISM ID, BACTERIA: NO GROWTH

## 2013-11-06 NOTE — Telephone Encounter (Signed)
   Reason for call:   I received a call from [name] at 5:45 PM asking for her lab results from her office visit 9/29   Pertinent Data:   Informed patient of TSH wnl.    Patient reports her sugars are better controlled, still having some occasional sweating no other complaints, has not started Actos yet   Assessment / Plan / Recommendations:   Instructed to continue to monitor sugars and to pick up Actos to start.  Advised to call back if other issues arise.  As always, pt is advised that if symptoms worsen or new symptoms arise, they should go to an urgent care facility or to to ER for further evaluation.   Lucious Groves, DO   11/06/2013, 5:53 PM

## 2013-11-07 ENCOUNTER — Emergency Department (HOSPITAL_COMMUNITY)
Admission: EM | Admit: 2013-11-07 | Discharge: 2013-11-07 | Disposition: A | Discharge status: Home / Self Care | Payer: No Typology Code available for payment source | Attending: Emergency Medicine | Admitting: Emergency Medicine

## 2013-11-07 ENCOUNTER — Encounter (HOSPITAL_COMMUNITY): Payer: Self-pay | Admitting: Emergency Medicine

## 2013-11-07 DIAGNOSIS — Z7982 Long term (current) use of aspirin: Secondary | ICD-10-CM | POA: Insufficient documentation

## 2013-11-07 DIAGNOSIS — E1165 Type 2 diabetes mellitus with hyperglycemia: Secondary | ICD-10-CM | POA: Insufficient documentation

## 2013-11-07 DIAGNOSIS — Z862 Personal history of diseases of the blood and blood-forming organs and certain disorders involving the immune mechanism: Secondary | ICD-10-CM | POA: Insufficient documentation

## 2013-11-07 DIAGNOSIS — N3001 Acute cystitis with hematuria: Secondary | ICD-10-CM | POA: Insufficient documentation

## 2013-11-07 DIAGNOSIS — Z8719 Personal history of other diseases of the digestive system: Secondary | ICD-10-CM | POA: Insufficient documentation

## 2013-11-07 DIAGNOSIS — R739 Hyperglycemia, unspecified: Secondary | ICD-10-CM

## 2013-11-07 DIAGNOSIS — N3000 Acute cystitis without hematuria: Secondary | ICD-10-CM

## 2013-11-07 DIAGNOSIS — Z9889 Other specified postprocedural states: Secondary | ICD-10-CM | POA: Insufficient documentation

## 2013-11-07 DIAGNOSIS — E785 Hyperlipidemia, unspecified: Secondary | ICD-10-CM | POA: Insufficient documentation

## 2013-11-07 DIAGNOSIS — Z87440 Personal history of urinary (tract) infections: Secondary | ICD-10-CM | POA: Insufficient documentation

## 2013-11-07 DIAGNOSIS — Z8739 Personal history of other diseases of the musculoskeletal system and connective tissue: Secondary | ICD-10-CM | POA: Insufficient documentation

## 2013-11-07 DIAGNOSIS — I129 Hypertensive chronic kidney disease with stage 1 through stage 4 chronic kidney disease, or unspecified chronic kidney disease: Secondary | ICD-10-CM | POA: Insufficient documentation

## 2013-11-07 DIAGNOSIS — Z8742 Personal history of other diseases of the female genital tract: Secondary | ICD-10-CM | POA: Insufficient documentation

## 2013-11-07 DIAGNOSIS — Z79899 Other long term (current) drug therapy: Secondary | ICD-10-CM | POA: Insufficient documentation

## 2013-11-07 DIAGNOSIS — Z794 Long term (current) use of insulin: Secondary | ICD-10-CM | POA: Insufficient documentation

## 2013-11-07 DIAGNOSIS — N183 Chronic kidney disease, stage 3 (moderate): Secondary | ICD-10-CM | POA: Insufficient documentation

## 2013-11-07 DIAGNOSIS — Z8759 Personal history of other complications of pregnancy, childbirth and the puerperium: Secondary | ICD-10-CM | POA: Insufficient documentation

## 2013-11-07 LAB — COMPREHENSIVE METABOLIC PANEL
ALBUMIN: 4.2 g/dL (ref 3.5–5.2)
ALT: 21 U/L (ref 0–35)
ANION GAP: 17 — AB (ref 5–15)
AST: 22 U/L (ref 0–37)
Alkaline Phosphatase: 118 U/L — ABNORMAL HIGH (ref 39–117)
BILIRUBIN TOTAL: 0.4 mg/dL (ref 0.3–1.2)
BUN: 32 mg/dL — AB (ref 6–23)
CHLORIDE: 92 meq/L — AB (ref 96–112)
CO2: 24 mEq/L (ref 19–32)
CREATININE: 1.44 mg/dL — AB (ref 0.50–1.10)
Calcium: 9.3 mg/dL (ref 8.4–10.5)
GFR calc non Af Amer: 40 mL/min — ABNORMAL LOW (ref >90)
GFR, EST AFRICAN AMERICAN: 46 mL/min — AB (ref >90)
GLUCOSE: 467 mg/dL — AB (ref 70–99)
Potassium: 4.3 mEq/L (ref 3.7–5.3)
Sodium: 133 mEq/L — ABNORMAL LOW (ref 137–147)
Total Protein: 7.8 g/dL (ref 6.0–8.3)

## 2013-11-07 LAB — CBC
HEMATOCRIT: 39.1 % (ref 36.0–46.0)
HEMOGLOBIN: 13.8 g/dL (ref 12.0–15.0)
MCH: 30.3 pg (ref 26.0–34.0)
MCHC: 35.3 g/dL (ref 30.0–36.0)
MCV: 85.9 fL (ref 78.0–100.0)
Platelets: 239 10*3/uL (ref 150–400)
RBC: 4.55 MIL/uL (ref 3.87–5.11)
RDW: 11.8 % (ref 11.5–15.5)
WBC: 10 10*3/uL (ref 4.0–10.5)

## 2013-11-07 LAB — URINALYSIS, ROUTINE W REFLEX MICROSCOPIC
BILIRUBIN URINE: NEGATIVE
Glucose, UA: >1000 mg/dL — AB
Hgb urine dipstick: NEGATIVE
Ketones, ur: NEGATIVE mg/dL
Nitrite: NEGATIVE
PH: 6 (ref 5.0–8.0)
Protein, ur: NEGATIVE mg/dL
Specific Gravity, Urine: 1.016 (ref 1.005–1.030)
UROBILINOGEN UA: 0.2 mg/dL (ref 0.0–1.0)

## 2013-11-07 LAB — I-STAT CHEM 8, ED
BUN: 38 mg/dL — ABNORMAL HIGH (ref 6–23)
CHLORIDE: 94 meq/L — AB (ref 96–112)
Calcium, Ion: 1.08 mmol/L — ABNORMAL LOW (ref 1.12–1.23)
Creatinine, Ser: 1.6 mg/dL — ABNORMAL HIGH (ref 0.50–1.10)
Glucose, Bld: 484 mg/dL — ABNORMAL HIGH (ref 70–99)
HCT: 44 % (ref 36.0–46.0)
Hemoglobin: 15 g/dL (ref 12.0–15.0)
Potassium: 4 mEq/L (ref 3.7–5.3)
Sodium: 131 mEq/L — ABNORMAL LOW (ref 137–147)
TCO2: 27 mmol/L (ref 0–100)

## 2013-11-07 LAB — URINE MICROSCOPIC-ADD ON

## 2013-11-07 LAB — CBG MONITORING, ED
Glucose-Capillary: 489 mg/dL — ABNORMAL HIGH (ref 70–99)
Glucose-Capillary: 311 mg/dL — ABNORMAL HIGH (ref 70–99)

## 2013-11-07 MED ORDER — SODIUM CHLORIDE 0.9 % IV BOLUS (SEPSIS)
1000.0000 mL | Freq: Once | INTRAVENOUS | Status: AC
  Administered 2013-11-07: 1000 mL via INTRAVENOUS

## 2013-11-07 MED ORDER — SULFAMETHOXAZOLE-TRIMETHOPRIM 800-160 MG PO TABS: 1.0000 | ORAL_TABLET | Freq: Two times a day (BID) | ORAL | Status: DC

## 2013-11-07 MED ORDER — INSULIN ASPART 100 UNIT/ML ~~LOC~~ SOLN
10.0000 [IU] | Freq: Once | SUBCUTANEOUS | Status: AC
  Administered 2013-11-07: 10 [IU] via INTRAVENOUS
  Filled 2013-11-07: qty 1

## 2013-11-07 NOTE — ED Notes (Addendum)
Wants her bladder checked; urinary frequency; hx. Of diabetes. Went to dr. To get meds to bring sugar down. Warm feeling down there.

## 2013-11-07 NOTE — ED Provider Notes (Signed)
CSN: [id number]     Arrival date & time 11/07/13  1620 History   First MD Initiated Contact with Patient 11/07/13 1714     Chief Complaint  Patient presents with  . Hyperglycemia     (Consider location/radiation/quality/duration/timing/severity/associated sxs/prior Treatment) HPI Comments: Patient also states her blood sugar is high. States it has been 300s-400s at home for "a while". In process of changing up DM regimen with her PCP. Last saw him 2 days ago. Has some polyuria, no polydipsia. No abd pain, n/v/d.  Patient is a 58 y.o. female presenting with general illness. The history is provided by the patient and medical records.  Illness Location:  Bladder, blood sugar Quality:  States she has been leaking urine for months, and her DM is uncontrolled Severity:  Mild Onset quality:  Gradual Timing:  Intermittent Progression:  Waxing and waning Chronicity:  Chronic Context:  1. pt with leakage of urine, worse with coughing. Sometimes leaks at night. States worse when sugar is high. No dysuria, abd pain, hematuria, f/c, chest pain, SOB. :[place] for months, waxinga nd waning. Relieved by:  Better control of BG Worsened by:  Worsening BG Ineffective treatments:  Changes in regimen of her BG Associated symptoms: no abdominal pain, no chest pain, no congestion, no diarrhea, no fatigue, no fever, no nausea, no rash, no rhinorrhea, no shortness of breath, no vomiting and no wheezing   Risk factors:  Hx of DM   Past Medical History  Diagnosis Date  . Diabetes mellitus type II, uncontrolled   . Hypertension   . Hyperlipidemia   . Ovarian cyst, left   . Anemia     due to menorrhagia, BL 8-10  . Vaginal cyst     nabothian and bartholin  . CKD (chronic kidney disease) stage 3, GFR 30-59 ml/min     baseline creatinine 1.4-1.7  . Anxiety   . Depression   . Postmenopausal bleeding 06/12/2008  . 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 UTI (lower urinary tract  infection)   . IBS (irritable bowel syndrome)   . Chronic back pain   . Chronic abdominal pain   . DDD (degenerative disc disease), lumbar   . Bilateral renal cysts 01/15/2009    Qualifier: Diagnosis of  By: Tyrell Antonio MD, Jerald Kief     Past Surgical History  Procedure Laterality Date  . Cardiac surgery      to repair congenital defect as a child   Family History  Problem Relation Age of Onset  . Stroke Father   . Heart attack Father     Had MI in his 46s  . Stomach cancer Paternal Grandmother   . Diabetes Maternal Grandmother   . Cerebral palsy Daughter   . Anesthesia problems Neg Hx   . Hypotension Neg Hx   . Malignant hyperthermia Neg Hx   . Pseudochol deficiency Neg Hx    History  Substance Use Topics  . Smoking status: Never Smoker   . Smokeless tobacco: Former Systems developer  . Alcohol Use: No   OB History   Grav Para Term Preterm Abortions TAB SAB Ect Mult Living   _0 Review of Systems  Constitutional: Negative for fever, activity change, appetite change and fatigue.  HENT: Negative for congestion and rhinorrhea.   Eyes: Negative for discharge, redness and itching.  Respiratory: Negative for shortness of breath and wheezing.   Cardiovascular: Negative  for chest pain.  Gastrointestinal: Negative for nausea, vomiting, abdominal pain and diarrhea.  Endocrine: Positive for polyuria.  Genitourinary: Positive for urgency. Negative for dysuria and hematuria.  Musculoskeletal: Negative for back pain.  Skin: Negative for rash and wound.  Neurological: Negative for syncope.      Allergies  Review of patient's allergies indicates no known allergies.  Home Medications   Prior to Admission medications   Medication Sig Start Date End Date Taking? Authorizing Provider  aspirin EC 81 MG tablet Take 81 mg by mouth daily.   Yes Historical Provider, MD  Blood Glucose Monitoring Suppl (BLOOD GLUCOSE METER) kit Check your blood sugar at least once per day, before  breakfast. ICD code 250.0 05/08/13  Yes Blain Pais, MD  Enalapril-Hydrochlorothiazide 5-12.5 MG per tablet Take 1 tablet by mouth daily. 11/04/13  Yes [name], MD  Insulin Detemir (LEVEMIR) 100 UNIT/ML Pen Inject 30 Units into the skin daily. ICD code 250.0, DM2 11/05/13  Yes [name], MD  pioglitazone (ACTOS) 15 MG tablet Take 1 tablet (15 mg total) by mouth daily. 11/05/13  Yes [name], MD  simvastatin (ZOCOR) 40 MG tablet Take 40 mg by mouth daily.   Yes Historical Provider, MD  sulfamethoxazole-trimethoprim (SEPTRA DS) 800-160 MG per tablet Take 1 tablet by mouth 2 (two) times daily. 11/07/13   Sol Passer, MD   BP 102/70  Pulse 91  Temp(Src) 98.2 F (36.8 C) (Oral)  Resp 15  SpO2 100% Physical Exam  Constitutional: She is oriented to person, place, and time. She appears well-developed and well-nourished. No distress.  HENT:  Head: Normocephalic and atraumatic.  Mouth/Throat: Oropharynx is clear and moist. No oropharyngeal exudate.  Eyes: Conjunctivae and EOM are normal. Pupils are equal, round, and reactive to light. Right eye exhibits no discharge. Left eye exhibits no discharge. No scleral icterus.  Neck: Normal range of motion. Neck supple.  Cardiovascular: Normal rate, regular rhythm and normal heart sounds.   No murmur heard. Pulmonary/Chest: Effort normal and breath sounds normal. No respiratory distress. She has no wheezes. She has no rales.  Abdominal: Soft. She exhibits no distension and no mass. There is no tenderness.  Neurological: She is alert and oriented to person, place, and time. She exhibits normal muscle tone. Coordination normal.  Skin: Skin is warm. No rash noted. She is not diaphoretic.    ED Course  Procedures (including critical care time) Labs Review Labs Reviewed  COMPREHENSIVE METABOLIC PANEL - Abnormal; Notable for the following:    Sodium 133 (*)    Chloride 92 (*)    Glucose, Bld 467 (*)    BUN 32 (*)     Creatinine, Ser 1.44 (*)    Alkaline Phosphatase 118 (*)    GFR calc non Af Amer 40 (*)    GFR calc Af Amer 46 (*)    Anion gap 17 (*)    All other components within normal limits  URINALYSIS, ROUTINE W REFLEX MICROSCOPIC - Abnormal; Notable for the following:    APPearance CLOUDY (*)    Glucose, UA >1000 (*)    Leukocytes, UA LARGE (*)    All other components within normal limits  URINE MICROSCOPIC-ADD ON - Abnormal; Notable for the following:    Squamous Epithelial / LPF FEW (*)    Bacteria, UA FEW (*)    All other components within normal limits  CBG MONITORING, ED - Abnormal; Notable for the following:    Glucose-Capillary 489 (*)  All other components within normal limits  I-STAT CHEM 8, ED - Abnormal; Notable for the following:    Sodium 131 (*)    Chloride 94 (*)    BUN 38 (*)    Creatinine, Ser 1.60 (*)    Glucose, Bld 484 (*)    Calcium, Ion 1.08 (*)    All other components within normal limits  CBG MONITORING, ED - Abnormal; Notable for the following:    Glucose-Capillary 311 (*)    All other components within normal limits  CBC    Imaging Review No results found.   EKG Interpretation None      MDM   MDM: 58 y.o. WF w/ PMhx of HLD, HTN, DM w/ cc:of "wanting bladder checked out." Chronic urinary incontinence. C/w urge incontinence. Also has uncontrolled DM that she follows with PCP for. Most recently saw PCP 2 days ago. Pt states tthat she just wants her bladder checked out. She is AFVSS, well appearing, NAD. Exam largely unremarkable. No abd ttp. I suspect patient has urge incontinence from being a older female with hx of childbirth. I suspect that her DM may be contributing as well as she has polyuria. I encouraged her to speak to her gyencologist or PCP regarding her incontinence. She is not endoring numbness, weakness, and has no back pain to make me concerned for spinal cord problem, especially as this is chronic. Regarding her BG, her baseline is 300-400. I  will not aggressively treat her as she will need gradual reduction over time. 10 u IV insulin given and fluid to bring down to 300, which is her baseline. She has a UTI on UA which could be contributing. We will treat with Bactrim. Return as needed. Follow up PCP as regularly scheduled but sooner if flank pain, n/v, fever. Discharged.   Final diagnoses:  Acute cystitis without hematuria  Hyperglycemia   Discharge Medication List as of 11/07/2013  7:07 PM    START taking these medications   Details  sulfamethoxazole-trimethoprim (SEPTRA DS) 800-160 MG per tablet Take 1 tablet by mouth 2 (two) times daily., Starting 11/07/2013, Until Discontinued, [place], [place] [id number] [phone]  Schedule an appointment as soon as possible for a visit in 3 days   Discharged   Sol Passer, MD 11/07/13 2046

## 2013-11-07 NOTE — Discharge Instructions (Signed)
Hyperglycemia °Hyperglycemia occurs when the glucose (sugar) in your blood is too high. Hyperglycemia can happen for many reasons, but it most often happens to people who do not know they have diabetes or are not managing their diabetes properly.  °CAUSES  °Whether you have diabetes or not, there are other causes of hyperglycemia. Hyperglycemia can occur when you have diabetes, but it can also occur in other situations that you might not be as aware of, such as: °Diabetes °· If you have diabetes and are having problems controlling your blood glucose, hyperglycemia could occur because of some of the following reasons: °¨ Not following your meal plan. °¨ Not taking your diabetes medications or not taking it properly. °¨ Exercising less or doing less activity than you normally do. °¨ Being sick. °Pre-diabetes °· This cannot be ignored. Before people develop Type 2 diabetes, they almost always have "pre-diabetes." This is when your blood glucose levels are higher than normal, but not yet high enough to be diagnosed as diabetes. Research has shown that some long-term damage to the body, especially the heart and circulatory system, may already be occurring during pre-diabetes. If you take action to manage your blood glucose when you have pre-diabetes, you may delay or prevent Type 2 diabetes from developing. °Stress °· If you have diabetes, you may be "diet" controlled or on oral medications or insulin to control your diabetes. However, you may find that your blood glucose is higher than usual in the hospital whether you have diabetes or not. This is often referred to as "stress hyperglycemia." Stress can elevate your blood glucose. This happens because of hormones put out by the body during times of stress. If stress has been the cause of your high blood glucose, it can be followed regularly by your caregiver. That way he/she can make sure your hyperglycemia does not continue to get worse or progress to  diabetes. °Steroids °· Steroids are medications that act on the infection fighting system (immune system) to block inflammation or infection. One side effect can be a rise in blood glucose. Most people can produce enough extra insulin to allow for this rise, but for those who cannot, steroids make blood glucose levels go even higher. It is not unusual for steroid treatments to "uncover" diabetes that is developing. It is not always possible to determine if the hyperglycemia will go away after the steroids are stopped. A special blood test called an A1c is sometimes done to determine if your blood glucose was elevated before the steroids were started. °SYMPTOMS °· Thirsty. °· Frequent urination. °· Dry mouth. °· Blurred vision. °· Tired or fatigue. °· Weakness. °· Sleepy. °· Tingling in feet or leg. °DIAGNOSIS  °Diagnosis is made by monitoring blood glucose in one or all of the following ways: °· A1c test. This is a chemical found in your blood. °· Fingerstick blood glucose monitoring. °· Laboratory results. °TREATMENT  °First, knowing the cause of the hyperglycemia is important before the hyperglycemia can be treated. Treatment may include, but is not be limited to: °· Education. °· Change or adjustment in medications. °· Change or adjustment in meal plan. °· Treatment for an illness, infection, etc. °· More frequent blood glucose monitoring. °· Change in exercise plan. °· Decreasing or stopping steroids. °· Lifestyle changes. °HOME CARE INSTRUCTIONS  °· Test your blood glucose as directed. °· Exercise regularly. Your caregiver will give you instructions about exercise. Pre-diabetes or diabetes which comes on with stress is helped by exercising. °· Eat wholesome,   balanced meals. Eat often and at regular, fixed times. Your caregiver or nutritionist will give you a meal plan to guide your sugar intake. °· Being at an ideal weight is important. If needed, losing as little as 10 to 15 pounds may help improve blood  glucose levels. °SEEK MEDICAL CARE IF:  °· You have questions about medicine, activity, or diet. °· You continue to have symptoms (problems such as increased thirst, urination, or weight gain). °SEEK IMMEDIATE MEDICAL CARE IF:  °· You are vomiting or have diarrhea. °· Your breath smells fruity. °· You are breathing faster or slower. °· You are very sleepy or incoherent. °· You have numbness, tingling, or pain in your feet or hands. °· You have chest pain. °· Your symptoms get worse even though you have been following your caregiver's orders. °· If you have any other questions or concerns. °Document Released: 07/20/2000 Document Revised: 04/18/2011 Document Reviewed: 05/23/2011 °ExitCare® Patient Information ©2015 [employer]. This information is not intended to replace advice given to you by your health care provider. Make sure you discuss any questions you have with your health care provider. ° °Urinary Tract Infection °Urinary tract infections (UTIs) can develop anywhere along your urinary tract. Your urinary tract is your body's drainage system for removing wastes and extra water. Your urinary tract includes two kidneys, two ureters, a bladder, and a urethra. Your kidneys are a pair of bean-shaped organs. Each kidney is about the size of your fist. They are located below your ribs, one on each side of your spine. °CAUSES °Infections are caused by microbes, which are microscopic organisms, including fungi, viruses, and bacteria. These organisms are so small that they can only be seen through a microscope. Bacteria are the microbes that most commonly cause UTIs. °SYMPTOMS  °Symptoms of UTIs may vary by age and gender of the patient and by the location of the infection. Symptoms in young women typically include a frequent and intense urge to urinate and a painful, burning feeling in the bladder or urethra during urination. Older women and men are more likely to be tired, shaky, and weak and have muscle aches and  abdominal pain. A fever may mean the infection is in your kidneys. Other symptoms of a kidney infection include pain in your back or sides below the ribs, nausea, and vomiting. °DIAGNOSIS °To diagnose a UTI, your caregiver will ask you about your symptoms. Your caregiver also will ask to provide a urine sample. The urine sample will be tested for bacteria and white blood cells. White blood cells are made by your body to help fight infection. °TREATMENT  °Typically, UTIs can be treated with medication. Because most UTIs are caused by a bacterial infection, they usually can be treated with the use of antibiotics. The choice of antibiotic and length of treatment depend on your symptoms and the type of bacteria causing your infection. °HOME CARE INSTRUCTIONS °· If you were prescribed antibiotics, take them exactly as your caregiver instructs you. Finish the medication even if you feel better after you have only taken some of the medication. °· Drink enough water and fluids to keep your urine clear or pale yellow. °· Avoid caffeine, tea, and carbonated beverages. They tend to irritate your bladder. °· Empty your bladder often. Avoid holding urine for long periods of time. °· Empty your bladder before and after sexual intercourse. °· After a bowel movement, women should cleanse from front to back. Use each tissue only once. °SEEK MEDICAL CARE IF:  °·   You have back pain. °· You develop a fever. °· Your symptoms do not begin to resolve within 3 days. °SEEK IMMEDIATE MEDICAL CARE IF:  °· You have severe back pain or lower abdominal pain. °· You develop chills. °· You have nausea or vomiting. °· You have continued burning or discomfort with urination. °MAKE SURE YOU:  °· Understand these instructions. °· Will watch your condition. °· Will get help right away if you are not doing well or get worse. °Document Released: 11/03/2004 Document Revised: 07/26/2011 Document Reviewed: 03/04/2011 °ExitCare® Patient Information ©2015  [employer]. This information is not intended to replace advice given to you by your health care provider. Make sure you discuss any questions you have with your health care provider. ° °

## 2013-11-07 NOTE — ED Provider Notes (Signed)
I saw and evaluated the patient, reviewed the resident's note and I agree with the findings and plan.   EKG Interpretation None      Results for orders placed during the hospital encounter of 11/07/13  CBC      Result Value Ref Range   WBC 10.0  4.0 - 10.5 K/uL   RBC 4.55  3.87 - 5.11 MIL/uL   Hemoglobin 13.8  12.0 - 15.0 g/dL   HCT 39.1  36.0 - 46.0 %   MCV 85.9  78.0 - 100.0 fL   MCH 30.3  26.0 - 34.0 pg   MCHC 35.3  30.0 - 36.0 g/dL   RDW 11.8  11.5 - 15.5 %   Platelets 239  150 - 400 K/uL  COMPREHENSIVE METABOLIC PANEL      Result Value Ref Range   Sodium 133 (*) 137 - 147 mEq/L   Potassium 4.3  3.7 - 5.3 mEq/L   Chloride 92 (*) 96 - 112 mEq/L   CO2 24  19 - 32 mEq/L   Glucose, Bld 467 (*) 70 - 99 mg/dL   BUN 32 (*) 6 - 23 mg/dL   Creatinine, Ser 1.44 (*) 0.50 - 1.10 mg/dL   Calcium 9.3  8.4 - 10.5 mg/dL   Total Protein 7.8  6.0 - 8.3 g/dL   Albumin 4.2  3.5 - 5.2 g/dL   AST 22  0 - 37 U/L   ALT 21  0 - 35 U/L   Alkaline Phosphatase 118 (*) 39 - 117 U/L   Total Bilirubin 0.4  0.3 - 1.2 mg/dL   GFR calc non Af Amer 40 (*) >90 mL/min   GFR calc Af Amer 46 (*) >90 mL/min   Anion gap 17 (*) 5 - 15  URINALYSIS, ROUTINE W REFLEX MICROSCOPIC      Result Value Ref Range   Color, Urine YELLOW  YELLOW   APPearance CLOUDY (*) CLEAR   Specific Gravity, Urine 1.016  1.005 - 1.030   pH 6.0  5.0 - 8.0   Glucose, UA >1000 (*) NEGATIVE mg/dL   Hgb urine dipstick NEGATIVE  NEGATIVE   Bilirubin Urine NEGATIVE  NEGATIVE   Ketones, ur NEGATIVE  NEGATIVE mg/dL   Protein, ur NEGATIVE  NEGATIVE mg/dL   Urobilinogen, UA 0.2  0.0 - 1.0 mg/dL   Nitrite NEGATIVE  NEGATIVE   Leukocytes, UA LARGE (*) NEGATIVE  URINE MICROSCOPIC-ADD ON      Result Value Ref Range   Squamous Epithelial / LPF FEW (*) RARE   WBC, UA 11-20  <3 WBC/hpf   RBC / HPF 0-2  <3 RBC/hpf   Bacteria, UA FEW (*) RARE  CBG MONITORING, ED      Result Value Ref Range   Glucose-Capillary 489 (*) 70 - 99 mg/dL  I-STAT  CHEM 8, ED      Result Value Ref Range   Sodium 131 (*) 137 - 147 mEq/L   Potassium 4.0  3.7 - 5.3 mEq/L   Chloride 94 (*) 96 - 112 mEq/L   BUN 38 (*) 6 - 23 mg/dL   Creatinine, Ser 1.60 (*) 0.50 - 1.10 mg/dL   Glucose, Bld 484 (*) 70 - 99 mg/dL   Calcium, Ion 1.08 (*) 1.12 - 1.23 mmol/L   TCO2 27  0 - 100 mmol/L   Hemoglobin 15.0  12.0 - 15.0 g/dL   HCT 44.0  36.0 - 46.0 %  CBG MONITORING, ED      Result Value Ref  Range   Glucose-Capillary 311 (*) 70 - 99 mg/dL   Comment 1 Notify RN     Comment 2 Documented in Chart     Ct Abdomen Pelvis W Contrast  10/19/2013   CLINICAL DATA:  Right lower quadrant abdominal pain.  EXAM: CT ABDOMEN AND PELVIS WITH CONTRAST  TECHNIQUE: Multidetector CT imaging of the abdomen and pelvis was performed using the standard protocol following bolus administration of intravenous contrast.  CONTRAST:  9mL OMNIPAQUE IOHEXOL 300 MG/ML  SOLN  COMPARISON:  01/18/2013  FINDINGS: Lung bases:  Minimal dependent atelectasis. No pleural effusion or pulmonary lesion. The heart is within normal limits in size and stable. No pericardial effusion. The distal esophagus is grossly normal.  CT abdomen:  The liver is unremarkable. No focal lesions or biliary dilatation. The gallbladder is normal. No common bile duct dilatation. The pancreas is unremarkable. Moderate to advanced atrophy of the body and tail is noted. The spleen is normal in size. No focal lesions. The adrenal glands and kidneys are unremarkable. No inflammatory changes or hydronephrosis. Stable appearing right renal cysts complicated by septations and calcification. Other slightly hyperdense renal cysts are noted but appears stable. Recommend continued surveillance.  The stomach, duodenum, small bowel and colon are unremarkable. No inflammatory changes, mass lesions or obstructive findings. The appendix is normal. No mesenteric or retroperitoneal mass or adenopathy. The aorta and branch vessels are normal and stable. The  major venous structures are patent.  CT pelvis:  Stable septate uterus. The ovaries are normal. No pelvic mass or adenopathy. The bladder is normal. No free pelvic fluid collections. No inguinal mass or adenopathy.  The bony structures are intact.  Stable degenerative changes.  IMPRESSION: No acute abdominal/ pelvic findings, mass lesions or adenopathy.  Overall stable complex bilateral renal cysts. Recommend continued surveillance.  Septate uterus.   Electronically Signed   By: Kalman Jewels M.D.   On: 10/19/2013 21:20   Mm Digital Screening  10/31/2013   CLINICAL DATA:  Screening.  EXAM: DIGITAL SCREENING BILATERAL MAMMOGRAM WITH CAD  COMPARISON:  Previous exam(s).  ACR Breast Density Category b: There are scattered areas of fibroglandular density.  FINDINGS: There are no findings suspicious for malignancy. Images were processed with CAD.  IMPRESSION: No mammographic evidence of malignancy. A result letter of this screening mammogram will be mailed directly to the patient.  RECOMMENDATION: Screening mammogram in one year. (Code:[id number]-B-01Y)  BI-RADS CATEGORY  1: Negative.   Electronically Signed   By: Enrique Sack M.D.   On: 10/31/2013 16:36    Patient seen by me. Patient with a history of diabetes sometimes poorly controlled. Patient with frequent urination so that may be due to the polyuria due to the high blood sugars. Patient without dysuria. The patient has had urinary tract infections in the past. Patient overall not feeling well but nontoxic no acute distress. Urinalysis is suggestive urinary tract infection will begin treatment of that. Patient's blood sugars elevated but improved here was originally in the 400 range and then the followup has a down to the 300 range. No evidence of any acidosis. Patient has followup available as an outpatient.  Fredia Sorrow, MD 11/07/13 Einar Crow

## 2013-11-08 DIAGNOSIS — N390 Urinary tract infection, site not specified: Secondary | ICD-10-CM | POA: Insufficient documentation

## 2013-11-08 NOTE — Assessment & Plan Note (Signed)
Pt with multiple Ed visits over the past few months with urinary complaints, come to clinic and completely denies any symptoms. And day after visit in the clinic is in the ED for same complaints. UA- With leukocytes and bacteria. Been treated with bactrim and ciprofloxacin several times.  Plan- Decided to get a urine culture- No growth and no colony count- 11/05/2013. - Unlikely pt has a UTI, and no benefit in continuing to treat any dysuric symptoms with antibiotics.Frequency most likely due to polyuria, working on getting pts blood sugars under control, with meds- Insulin and actos. Pt scheduled to meet with our diabetes co-ordinator, as she admits to dietary indiscretion all the time, and will like to work on this.

## 2013-11-09 ENCOUNTER — Encounter (HOSPITAL_COMMUNITY): Payer: Self-pay | Admitting: Emergency Medicine

## 2013-11-09 ENCOUNTER — Emergency Department (HOSPITAL_COMMUNITY)
Admission: EM | Admit: 2013-11-09 | Discharge: 2013-11-09 | Disposition: A | Discharge status: Home / Self Care | Payer: No Typology Code available for payment source | Attending: Emergency Medicine | Admitting: Emergency Medicine

## 2013-11-09 ENCOUNTER — Inpatient Hospital Stay (HOSPITAL_COMMUNITY)
Admission: AD | Admit: 2013-11-09 | Discharge: 2013-11-10 | Disposition: A | Discharge status: Home / Self Care | Payer: No Typology Code available for payment source | Source: Ambulatory Visit | Attending: Family Medicine | Admitting: Family Medicine

## 2013-11-09 ENCOUNTER — Telehealth: Payer: Self-pay | Admitting: Internal Medicine

## 2013-11-09 ENCOUNTER — Encounter (HOSPITAL_COMMUNITY): Payer: Self-pay | Admitting: *Deleted

## 2013-11-09 DIAGNOSIS — Z792 Long term (current) use of antibiotics: Secondary | ICD-10-CM | POA: Insufficient documentation

## 2013-11-09 DIAGNOSIS — R1032 Left lower quadrant pain: Secondary | ICD-10-CM | POA: Insufficient documentation

## 2013-11-09 DIAGNOSIS — R109 Unspecified abdominal pain: Secondary | ICD-10-CM

## 2013-11-09 DIAGNOSIS — Z9889 Other specified postprocedural states: Secondary | ICD-10-CM | POA: Insufficient documentation

## 2013-11-09 DIAGNOSIS — Z862 Personal history of diseases of the blood and blood-forming organs and certain disorders involving the immune mechanism: Secondary | ICD-10-CM | POA: Insufficient documentation

## 2013-11-09 DIAGNOSIS — Z7982 Long term (current) use of aspirin: Secondary | ICD-10-CM | POA: Insufficient documentation

## 2013-11-09 DIAGNOSIS — K589 Irritable bowel syndrome without diarrhea: Secondary | ICD-10-CM | POA: Insufficient documentation

## 2013-11-09 DIAGNOSIS — Z79899 Other long term (current) drug therapy: Secondary | ICD-10-CM | POA: Insufficient documentation

## 2013-11-09 DIAGNOSIS — Z87440 Personal history of urinary (tract) infections: Secondary | ICD-10-CM | POA: Insufficient documentation

## 2013-11-09 DIAGNOSIS — Z794 Long term (current) use of insulin: Secondary | ICD-10-CM | POA: Insufficient documentation

## 2013-11-09 DIAGNOSIS — M5136 Other intervertebral disc degeneration, lumbar region: Secondary | ICD-10-CM | POA: Insufficient documentation

## 2013-11-09 DIAGNOSIS — R000 Tachycardia, unspecified: Secondary | ICD-10-CM | POA: Insufficient documentation

## 2013-11-09 DIAGNOSIS — R17 Unspecified jaundice: Secondary | ICD-10-CM | POA: Insufficient documentation

## 2013-11-09 DIAGNOSIS — Z8742 Personal history of other diseases of the female genital tract: Secondary | ICD-10-CM | POA: Insufficient documentation

## 2013-11-09 DIAGNOSIS — Z8739 Personal history of other diseases of the musculoskeletal system and connective tissue: Secondary | ICD-10-CM | POA: Insufficient documentation

## 2013-11-09 DIAGNOSIS — Q249 Congenital malformation of heart, unspecified: Secondary | ICD-10-CM | POA: Insufficient documentation

## 2013-11-09 DIAGNOSIS — G8929 Other chronic pain: Secondary | ICD-10-CM | POA: Insufficient documentation

## 2013-11-09 DIAGNOSIS — N183 Chronic kidney disease, stage 3 (moderate): Secondary | ICD-10-CM | POA: Insufficient documentation

## 2013-11-09 DIAGNOSIS — E119 Type 2 diabetes mellitus without complications: Secondary | ICD-10-CM | POA: Insufficient documentation

## 2013-11-09 DIAGNOSIS — I129 Hypertensive chronic kidney disease with stage 1 through stage 4 chronic kidney disease, or unspecified chronic kidney disease: Secondary | ICD-10-CM | POA: Insufficient documentation

## 2013-11-09 DIAGNOSIS — E785 Hyperlipidemia, unspecified: Secondary | ICD-10-CM | POA: Insufficient documentation

## 2013-11-09 DIAGNOSIS — E789 Disorder of lipoprotein metabolism, unspecified: Secondary | ICD-10-CM | POA: Insufficient documentation

## 2013-11-09 DIAGNOSIS — R42 Dizziness and giddiness: Secondary | ICD-10-CM | POA: Insufficient documentation

## 2013-11-09 DIAGNOSIS — Z8659 Personal history of other mental and behavioral disorders: Secondary | ICD-10-CM | POA: Insufficient documentation

## 2013-11-09 DIAGNOSIS — Z8719 Personal history of other diseases of the digestive system: Secondary | ICD-10-CM | POA: Insufficient documentation

## 2013-11-09 LAB — CBC WITH DIFFERENTIAL/PLATELET
BASOS ABS: 0 10*3/uL (ref 0.0–0.1)
Basophils Relative: 0 % (ref 0–1)
Eosinophils Absolute: 0 10*3/uL (ref 0.0–0.7)
Eosinophils Relative: 0 % (ref 0–5)
HEMATOCRIT: 36.1 % (ref 36.0–46.0)
Hemoglobin: 12.5 g/dL (ref 12.0–15.0)
LYMPHS PCT: 13 % (ref 12–46)
Lymphs Abs: 1.4 10*3/uL (ref 0.7–4.0)
MCH: 29.8 pg (ref 26.0–34.0)
MCHC: 34.6 g/dL (ref 30.0–36.0)
MCV: 86.2 fL (ref 78.0–100.0)
Monocytes Absolute: 0.4 10*3/uL (ref 0.1–1.0)
Monocytes Relative: 4 % (ref 3–12)
Neutro Abs: 8.4 10*3/uL — ABNORMAL HIGH (ref 1.7–7.7)
Neutrophils Relative %: 83 % — ABNORMAL HIGH (ref 43–77)
PLATELETS: 203 10*3/uL (ref 150–400)
RBC: 4.19 MIL/uL (ref 3.87–5.11)
RDW: 11.8 % (ref 11.5–15.5)
WBC: 10.1 10*3/uL (ref 4.0–10.5)

## 2013-11-09 LAB — URINALYSIS, ROUTINE W REFLEX MICROSCOPIC
BILIRUBIN URINE: NEGATIVE
Glucose, UA: NEGATIVE mg/dL
Hgb urine dipstick: NEGATIVE
KETONES UR: 15 mg/dL — AB
Nitrite: NEGATIVE
PH: 5.5 (ref 5.0–8.0)
Protein, ur: NEGATIVE mg/dL
Specific Gravity, Urine: 1.016 (ref 1.005–1.030)
Urobilinogen, UA: 0.2 mg/dL (ref 0.0–1.0)

## 2013-11-09 LAB — URINE MICROSCOPIC-ADD ON

## 2013-11-09 LAB — COMPREHENSIVE METABOLIC PANEL
ALT: 20 U/L (ref 0–35)
AST: 20 U/L (ref 0–37)
Albumin: 4.2 g/dL (ref 3.5–5.2)
Alkaline Phosphatase: 95 U/L (ref 39–117)
Anion gap: 15 (ref 5–15)
BILIRUBIN TOTAL: 0.3 mg/dL (ref 0.3–1.2)
BUN: 32 mg/dL — ABNORMAL HIGH (ref 6–23)
CHLORIDE: 93 meq/L — AB (ref 96–112)
CO2: 25 meq/L (ref 19–32)
Calcium: 9 mg/dL (ref 8.4–10.5)
Creatinine, Ser: 1.75 mg/dL — ABNORMAL HIGH (ref 0.50–1.10)
GFR calc Af Amer: 36 mL/min — ABNORMAL LOW (ref >90)
GFR, EST NON AFRICAN AMERICAN: 31 mL/min — AB (ref >90)
Glucose, Bld: 198 mg/dL — ABNORMAL HIGH (ref 70–99)
Potassium: 4.1 mEq/L (ref 3.7–5.3)
SODIUM: 133 meq/L — AB (ref 137–147)
Total Protein: 7.3 g/dL (ref 6.0–8.3)

## 2013-11-09 LAB — LIPASE, BLOOD: Lipase: 16 U/L (ref 11–59)

## 2013-11-09 MED ORDER — ONDANSETRON 4 MG PO TBDP
4.0000 mg | ORAL_TABLET | Freq: Once | ORAL | Status: AC
Start: 2013-11-09 — End: 2013-11-09
  Administered 2013-11-09: 4 mg via ORAL
  Filled 2013-11-09: qty 1

## 2013-11-09 MED ORDER — DICYCLOMINE HCL 10 MG PO CAPS
20.0000 mg | ORAL_CAPSULE | Freq: Once | ORAL | Status: AC
  Administered 2013-11-09: 20 mg via ORAL
  Filled 2013-11-09: qty 2

## 2013-11-09 MED ORDER — ONDANSETRON HCL 4 MG PO TABS: 4.0000 mg | ORAL_TABLET | Freq: Four times a day (QID) | ORAL | Status: DC

## 2013-11-09 MED ORDER — ACETAMINOPHEN 500 MG PO TABS
1000.0000 mg | ORAL_TABLET | Freq: Once | ORAL | Status: AC
  Administered 2013-11-09: 1000 mg via ORAL
  Filled 2013-11-09: qty 2

## 2013-11-09 MED ORDER — DICYCLOMINE HCL 20 MG PO TABS: 20.0000 mg | ORAL_TABLET | Freq: Two times a day (BID) | ORAL | Status: DC

## 2013-11-09 MED ORDER — HYDROCODONE-ACETAMINOPHEN 5-325 MG PO TABS: 1.0000 | ORAL_TABLET | ORAL | Status: DC | PRN

## 2013-11-09 NOTE — Discharge Instructions (Signed)
If you were given medicines take as directed.  If you are on coumadin or contraceptives realize their levels and effectiveness is altered by many different medicines.  If you have any reaction (rash, tongues swelling, other) to the medicines stop taking and see a physician.   Please follow up as directed and return to the ER or see a physician for new or worsening symptoms.  Thank you. Filed Vitals:   11/09/13 0933 11/09/13 0939 11/09/13 1143  BP: 166/85  138/74  Pulse: 102  100  Temp: 100 F (37.8 C)    TempSrc: Oral    Resp: 20  22  Height:  5\' 2"  (1.575 m)   Weight:  169 lb 6 oz (76.828 kg)   SpO2: 99%  100%   See GI to discuss scope.  Abdominal Pain, Women Abdominal (stomach, pelvic, or belly) pain can be caused by many things. It is important to tell your doctor:  The location of the pain.  Does it come and go or is it present all the time?  Are there things that start the pain (eating certain foods, exercise)?  Are there other symptoms associated with the pain (fever, nausea, vomiting, diarrhea)? All of this is helpful to know when trying to find the cause of the pain. CAUSES   Stomach: virus or bacteria infection, or ulcer.  Intestine: appendicitis (inflamed appendix), regional ileitis (Crohn's disease), ulcerative colitis (inflamed colon), irritable bowel syndrome, diverticulitis (inflamed diverticulum of the colon), or cancer of the stomach or intestine.  Gallbladder disease or stones in the gallbladder.  Kidney disease, kidney stones, or infection.  Pancreas infection or cancer.  Fibromyalgia (pain disorder).  Diseases of the female organs:  Uterus: fibroid (non-cancerous) tumors or infection.  Fallopian tubes: infection or tubal pregnancy.  Ovary: cysts or tumors.  Pelvic adhesions (scar tissue).  Endometriosis (uterus lining tissue growing in the pelvis and on the pelvic organs).  Pelvic congestion syndrome (female organs filling up with blood just  before the menstrual period).  Pain with the menstrual period.  Pain with ovulation (producing an egg).  Pain with an IUD (intrauterine device, birth control) in the uterus.  Cancer of the female organs.  Functional pain (pain not caused by a disease, may improve without treatment).  Psychological pain.  Depression. DIAGNOSIS  Your doctor will decide the seriousness of your pain by doing an examination.  Blood tests.  X-rays.  Ultrasound.  CT scan (computed tomography, special type of X-ray).  MRI (magnetic resonance imaging).  Cultures, for infection.  Barium enema (dye inserted in the large intestine, to better view it with X-rays).  Colonoscopy (looking in intestine with a lighted tube).  Laparoscopy (minor surgery, looking in abdomen with a lighted tube).  Major abdominal exploratory surgery (looking in abdomen with a large incision). TREATMENT  The treatment will depend on the cause of the pain.   Many cases can be observed and treated at home.  Over-the-counter medicines recommended by your caregiver.  Prescription medicine.  Antibiotics, for infection.  Birth control pills, for painful periods or for ovulation pain.  Hormone treatment, for endometriosis.  Nerve blocking injections.  Physical therapy.  Antidepressants.  Counseling with a psychologist or psychiatrist.  Minor or major surgery. HOME CARE INSTRUCTIONS   Do not take laxatives, unless directed by your caregiver.  Take over-the-counter pain medicine only if ordered by your caregiver. Do not take aspirin because it can cause an upset stomach or bleeding.  Try a clear liquid diet (broth or  water) as ordered by your caregiver. Slowly move to a bland diet, as tolerated, if the pain is related to the stomach or intestine.  Have a thermometer and take your temperature several times a day, and record it.  Bed rest and sleep, if it helps the pain.  Avoid sexual intercourse, if it causes  pain.  Avoid stressful situations.  Keep your follow-up appointments and tests, as your caregiver orders.  If the pain does not go away with medicine or surgery, you may try:  Acupuncture.  Relaxation exercises (yoga, meditation).  Group therapy.  Counseling. SEEK MEDICAL CARE IF:   You notice certain foods cause stomach pain.  Your home care treatment is not helping your pain.  You need stronger pain medicine.  You want your IUD removed.  You feel faint or lightheaded.  You develop nausea and vomiting.  You develop a rash.  You are having side effects or an allergy to your medicine. SEEK IMMEDIATE MEDICAL CARE IF:   Your pain does not go away or gets worse.  You have a fever.  Your pain is felt only in portions of the abdomen. The right side could possibly be appendicitis. The left lower portion of the abdomen could be colitis or diverticulitis.  You are passing blood in your stools (bright red or black tarry stools, with or without vomiting).  You have blood in your urine.  You develop chills, with or without a fever.  You pass out. MAKE SURE YOU:   Understand these instructions.  Will watch your condition.  Will get help right away if you are not doing well or get worse. Document Released: 11/21/2006 Document Revised: 06/10/2013 Document Reviewed: 12/11/2008 [facility] Patient Information 2015 [place], [place]. This information is not intended to replace advice given to you by your health care provider. Make sure you discuss any questions you have with your health care provider.

## 2013-11-09 NOTE — Discharge Instructions (Signed)
Abdominal Pain, Women °Abdominal (stomach, pelvic, or belly) pain can be caused by many things. It is important to tell your doctor: °· The location of the pain. °· Does it come and go or is it present all the time? °· Are there things that start the pain (eating certain foods, exercise)? °· Are there other symptoms associated with the pain (fever, nausea, vomiting, diarrhea)? °All of this is helpful to know when trying to find the cause of the pain. °CAUSES  °· Stomach: virus or bacteria infection, or ulcer. °· Intestine: appendicitis (inflamed appendix), regional ileitis (Crohn's disease), ulcerative colitis (inflamed colon), irritable bowel syndrome, diverticulitis (inflamed diverticulum of the colon), or cancer of the stomach or intestine. °· Gallbladder disease or stones in the gallbladder. °· Kidney disease, kidney stones, or infection. °· Pancreas infection or cancer. °· Fibromyalgia (pain disorder). °· Diseases of the female organs: °¨ Uterus: fibroid (non-cancerous) tumors or infection. °¨ Fallopian tubes: infection or tubal pregnancy. °¨ Ovary: cysts or tumors. °¨ Pelvic adhesions (scar tissue). °¨ Endometriosis (uterus lining tissue growing in the pelvis and on the pelvic organs). °¨ Pelvic congestion syndrome (female organs filling up with blood just before the menstrual period). °¨ Pain with the menstrual period. °¨ Pain with ovulation (producing an egg). °¨ Pain with an IUD (intrauterine device, birth control) in the uterus. °¨ Cancer of the female organs. °· Functional pain (pain not caused by a disease, may improve without treatment). °· Psychological pain. °· Depression. °DIAGNOSIS  °Your doctor will decide the seriousness of your pain by doing an examination. °· Blood tests. °· X-rays. °· Ultrasound. °· CT scan (computed tomography, special type of X-ray). °· MRI (magnetic resonance imaging). °· Cultures, for infection. °· Barium enema (dye inserted in the large intestine, to better view it with  X-rays). °· Colonoscopy (looking in intestine with a lighted tube). °· Laparoscopy (minor surgery, looking in abdomen with a lighted tube). °· Major abdominal exploratory surgery (looking in abdomen with a large incision). °TREATMENT  °The treatment will depend on the cause of the pain.  °· Many cases can be observed and treated at home. °· Over-the-counter medicines recommended by your caregiver. °· Prescription medicine. °· Antibiotics, for infection. °· Birth control pills, for painful periods or for ovulation pain. °· Hormone treatment, for endometriosis. °· Nerve blocking injections. °· Physical therapy. °· Antidepressants. °· Counseling with a psychologist or psychiatrist. °· Minor or major surgery. °HOME CARE INSTRUCTIONS  °· Do not take laxatives, unless directed by your caregiver. °· Take over-the-counter pain medicine only if ordered by your caregiver. Do not take aspirin because it can cause an upset stomach or bleeding. °· Try a clear liquid diet (broth or water) as ordered by your caregiver. Slowly move to a bland diet, as tolerated, if the pain is related to the stomach or intestine. °· Have a thermometer and take your temperature several times a day, and record it. °· Bed rest and sleep, if it helps the pain. °· Avoid sexual intercourse, if it causes pain. °· Avoid stressful situations. °· Keep your follow-up appointments and tests, as your caregiver orders. °· If the pain does not go away with medicine or surgery, you may try: °¨ Acupuncture. °¨ Relaxation exercises (yoga, meditation). °¨ Group therapy. °¨ Counseling. °SEEK MEDICAL CARE IF:  °· You notice certain foods cause stomach pain. °· Your home care treatment is not helping your pain. °· You need stronger pain medicine. °· You want your IUD removed. °· You feel faint or   lightheaded. °· You develop nausea and vomiting. °· You develop a rash. °· You are having side effects or an allergy to your medicine. °SEEK IMMEDIATE MEDICAL CARE IF:  °· Your  pain does not go away or gets worse. °· You have a fever. °· Your pain is felt only in portions of the abdomen. The right side could possibly be appendicitis. The left lower portion of the abdomen could be colitis or diverticulitis. °· You are passing blood in your stools (bright red or black tarry stools, with or without vomiting). °· You have blood in your urine. °· You develop chills, with or without a fever. °· You pass out. °MAKE SURE YOU:  °· Understand these instructions. °· Will watch your condition. °· Will get help right away if you are not doing well or get worse. °Document Released: 11/21/2006 Document Revised: 06/10/2013 Document Reviewed: 12/11/2008 °ExitCare® Patient Information ©2015 [employer]. This information is not intended to replace advice given to you by your health care provider. Make sure you discuss any questions you have with your health care provider. ° °

## 2013-11-09 NOTE — ED Notes (Signed)
Pt states abdominal pain x6 months. Was seen for same at [facility] ED and [facility] today. Now states she is back and someone is going to find out what is going on. 8/10 pain at present. No signs of distress noted. No active vomiting.

## 2013-11-09 NOTE — ED Notes (Signed)
MD at bedside. 

## 2013-11-09 NOTE — ED Provider Notes (Signed)
Medical screening examination/treatment/procedure(s) were performed by non-physician practitioner and as supervising physician I was immediately available for consultation/collaboration.    Dorie Rank, MD 11/09/13 [phone]

## 2013-11-09 NOTE — MAU Note (Addendum)
Having pain in lower abd "for a good while". Yellow d/c. Have cyst on my kidneys. Peeing all night and know some is due to my sugar. Just want my bladder checked. Was at [facility] today, [facility], back to [facility] ED and now here.

## 2013-11-09 NOTE — ED Provider Notes (Signed)
CSN: [id number]     Arrival date & time 11/09/13  1653 History   First MD Initiated Contact with Patient 11/09/13 1712     Chief Complaint  Patient presents with  . Abdominal Pain     (Consider location/radiation/quality/duration/timing/severity/associated sxs/prior Treatment) HPI [name] is a 58 y.o. female history of chronic abdominal pain he was seen this morning for this pain, comes in for reevaluation of this pain this evening. Pain is unchanged from previous encounter and is same as her chronic discomfort. She was seen this morning with a negative workup for a source of her abdominal pain. Reports that she cannot go to [place] with her orange card because she can't afford it. She says the Bentyl she received this morning worked a little bit but she still has abdominal discomfort. She describes the discomfort as a warmth in her lower left quadrant. Discomfort comes and goes and there are no relieving or aggravating factors. Her last scan was done on September 12 with no evidence of acute intra-abdominal pathology. She denies fevers, chest pain, shortness of breath, over abdominal pain, numbness or weakness. Patient states she might be nauseous now, but she is not sure.  Past Medical History  Diagnosis Date  . Diabetes mellitus type II, uncontrolled   . Hypertension   . Hyperlipidemia   . Ovarian cyst, left   . Anemia     due to menorrhagia, BL 8-10  . Vaginal cyst     nabothian and bartholin  . CKD (chronic kidney disease) stage 3, GFR 30-59 ml/min     baseline creatinine 1.4-1.7  . Anxiety   . Depression   . Postmenopausal bleeding 06/12/2008  . 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 UTI (lower urinary tract infection)   . IBS (irritable bowel syndrome)   . Chronic back pain   . Chronic abdominal pain   . DDD (degenerative disc disease), lumbar   . Bilateral renal cysts 01/15/2009    Qualifier: Diagnosis of  By: Tyrell Antonio MD, Jerald Kief     Past  Surgical History  Procedure Laterality Date  . Cardiac surgery      to repair congenital defect as a child   Family History  Problem Relation Age of Onset  . Stroke Father   . Heart attack Father     Had MI in his 33s  . Stomach cancer Paternal Grandmother   . Diabetes Maternal Grandmother   . Cerebral palsy Daughter   . Anesthesia problems Neg Hx   . Hypotension Neg Hx   . Malignant hyperthermia Neg Hx   . Pseudochol deficiency Neg Hx    History  Substance Use Topics  . Smoking status: Never Smoker   . Smokeless tobacco: Former Systems developer  . Alcohol Use: No   OB History   Grav Para Term Preterm Abortions TAB SAB Ect Mult Living   3 1 1  2  2   1      Review of Systems  Constitutional: Negative for fever.  HENT: Negative for sore throat.   Eyes: Negative for visual disturbance.  Respiratory: Negative for shortness of breath.   Cardiovascular: Negative for chest pain.  Gastrointestinal:       Abdominal discomfort in LLQ  Endocrine: Negative for polyuria.  Genitourinary: Negative for dysuria.  Skin: Negative for rash.  Neurological: Negative for headaches.      Allergies  Review of patient's allergies indicates no known allergies.  Home Medications  Prior to Admission medications   Medication Sig Start Date End Date Taking? Authorizing Provider  aspirin EC 81 MG tablet Take 81 mg by mouth daily.    Historical Provider, MD  dicyclomine (BENTYL) 20 MG tablet Take 1 tablet (20 mg total) by mouth 2 (two) times daily. 11/09/13   Johnna Acosta, MD  Enalapril-Hydrochlorothiazide 5-12.5 MG per tablet Take 1 tablet by mouth daily. 11/04/13   Ejiroghene Arlyce Dice, MD  HYDROcodone-acetaminophen (NORCO) 5-325 MG per tablet Take 1-2 tablets by mouth every 4 (four) hours as needed. 11/09/13   Mariea Clonts, MD  Insulin Detemir (LEVEMIR) 100 UNIT/ML Pen Inject 30 Units into the skin daily. ICD code 250.0, DM2 11/05/13   Ejiroghene E Denton Brick, MD  pioglitazone (ACTOS) 15 MG tablet Take  1 tablet (15 mg total) by mouth daily. 11/05/13   Ejiroghene Arlyce Dice, MD  simvastatin (ZOCOR) 40 MG tablet Take 40 mg by mouth daily.    Historical Provider, MD  sulfamethoxazole-trimethoprim (SEPTRA DS) 800-160 MG per tablet Take 1 tablet by mouth 2 (two) times daily. 11/07/13   Sol Passer, MD   BP 152/73  Pulse 107  Temp(Src) 98.2 F (36.8 C) (Oral)  Resp 22  SpO2 99% Physical Exam  Nursing note and vitals reviewed. Constitutional: She is oriented to person, place, and time. She appears well-developed and well-nourished.  HENT:  Head: Normocephalic and atraumatic.  Mouth/Throat: Oropharynx is clear and moist.  Eyes: Conjunctivae are normal. Pupils are equal, round, and reactive to light. Right eye exhibits no discharge. Left eye exhibits no discharge. No scleral icterus.  Neck: Neck supple.  Cardiovascular: Normal rate, regular rhythm and normal heart sounds.   Pulmonary/Chest: Effort normal and breath sounds normal. No respiratory distress. She has no wheezes. She has no rales.  Abdominal: Soft. There is no tenderness.  Abdomen is soft, nontender with no distention. There is no tenderness in any quadrant. No other rashes, lesions or masses appreciated. No peritoneal signs. No evidence of acute or surgical abdomen  Musculoskeletal: She exhibits no tenderness.  Neurological: She is alert and oriented to person, place, and time.  Cranial Nerves II-XII grossly intact  Skin: Skin is warm and dry. No rash noted.  Psychiatric: She has a normal mood and affect.    ED Course  Procedures (including critical care time) Labs Review Labs Reviewed - No data to display  Imaging Review No results found.   EKG Interpretation None     Meds given in ED:  Medications  ondansetron (ZOFRAN-ODT) disintegrating tablet 4 mg (4 mg Oral Given 11/09/13 1826)    New Prescriptions   ONDANSETRON (ZOFRAN) 4 MG TABLET    Take 1 tablet (4 mg total) by mouth every 6 (six) hours.   Filed Vitals:    11/09/13 1657 11/09/13 1855  BP: 152/73 114/47  Pulse: 107   Temp: 98.2 F (36.8 C)   TempSrc: Oral   Resp: 22 17  SpO2: 99% 97%    MDM  Vitals stable - WNL -afebrile Pt resting comfortably in ED. Reports Zofran worked well and now she is requesting something to drink. Discussed need for patient to followup with GI primary care for further evaluation of her chronic abdominal pain. Explained her recent workups in the ED have been essentially benign have not shown an emergent source for abdominal pain. PE today showed no abdominal pain, no tenderness to palpation, no sign of acute or surgical abdomen.  Will DC with Zofran and have her followup with  GI. Discussed f/u with PCP and return precautions, pt very amenable to plan.   Final diagnoses:  Chronic abdominal pain       Verl Dicker, PA-C 11/09/13 1859

## 2013-11-09 NOTE — ED Notes (Signed)
Pt wanting to talk with MD.  Pt wants further testing done today, does not want to wait on colonoscopy to be done.

## 2013-11-09 NOTE — ED Provider Notes (Signed)
CSN: [id number]     Arrival date & time 11/09/13  0913 History   First MD Initiated Contact with Patient 11/09/13 [phone]     Chief Complaint  Patient presents with  . Abdominal Pain     (Consider location/radiation/quality/duration/timing/severity/associated sxs/prior Treatment) HPI Comments: 58 year old female with diabetes, lipids, high blood pressure, kidney disease, renal cysts, urine infection presents with left lower cord and abdominal pain intermittent for the past 3 months. Patient has been seen in the past for this head CT scan which had no acute findings. Patient has not followed up outpatient for further evaluation after that. Patient has had intermittent pain without specific association such as food position etc. No vaginal or GI changes. No blood in stools. No diverticular disease history. Pain comes and goes with various timing. Nonradiating. Patient currently being treated for UTI with Bactrim.  Patient is a 58 y.o. female presenting with abdominal pain. The history is provided by the patient.  Abdominal Pain Associated symptoms: no chest pain, no chills, no dysuria, no fever, no nausea, no shortness of breath and no vomiting     Past Medical History  Diagnosis Date  . Diabetes mellitus type II, uncontrolled   . Hypertension   . Hyperlipidemia   . Ovarian cyst, left   . Anemia     due to menorrhagia, BL 8-10  . Vaginal cyst     nabothian and bartholin  . CKD (chronic kidney disease) stage 3, GFR 30-59 ml/min     baseline creatinine 1.4-1.7  . Anxiety   . Depression   . Postmenopausal bleeding 06/12/2008  . 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 UTI (lower urinary tract infection)   . IBS (irritable bowel syndrome)   . Chronic back pain   . Chronic abdominal pain   . DDD (degenerative disc disease), lumbar   . Bilateral renal cysts 01/15/2009    Qualifier: Diagnosis of  By: Tyrell Antonio MD, Jerald Kief     Past Surgical History  Procedure Laterality  Date  . Cardiac surgery      to repair congenital defect as a child   Family History  Problem Relation Age of Onset  . Stroke Father   . Heart attack Father     Had MI in his 47s  . Stomach cancer Paternal Grandmother   . Diabetes Maternal Grandmother   . Cerebral palsy Daughter   . Anesthesia problems Neg Hx   . Hypotension Neg Hx   . Malignant hyperthermia Neg Hx   . Pseudochol deficiency Neg Hx    History  Substance Use Topics  . Smoking status: Never Smoker   . Smokeless tobacco: Former Systems developer  . Alcohol Use: No   OB History   Grav Para Term Preterm Abortions TAB SAB Ect Mult Living   _0 Review of Systems  Constitutional: Negative for fever and chills.  HENT: Negative for congestion.   Eyes: Negative for visual disturbance.  Respiratory: Negative for shortness of breath.   Cardiovascular: Negative for chest pain.  Gastrointestinal: Positive for abdominal pain. Negative for nausea, vomiting and blood in stool.  Genitourinary: Negative for dysuria and flank pain.  Musculoskeletal: Negative for back pain, neck pain and neck stiffness.  Skin: Negative for rash.  Neurological: Positive for light-headedness. Negative for syncope and headaches.      Allergies  Review of patient's allergies indicates no known allergies.  Home  Medications   Prior to Admission medications   Medication Sig Start Date End Date Taking? Authorizing Provider  aspirin EC 81 MG tablet Take 81 mg by mouth daily.   Yes Historical Provider, MD  Blood Glucose Monitoring Suppl (BLOOD GLUCOSE METER) kit Check your blood sugar at least once per day, before breakfast. ICD code 250.0 05/08/13  Yes Blain Pais, MD  Enalapril-Hydrochlorothiazide 5-12.5 MG per tablet Take 1 tablet by mouth daily. 11/04/13  Yes [name], MD  Insulin Detemir (LEVEMIR) 100 UNIT/ML Pen Inject 30 Units into the skin daily. ICD code 250.0, DM2 11/05/13  Yes [name], MD  pioglitazone  (ACTOS) 15 MG tablet Take 1 tablet (15 mg total) by mouth daily. 11/05/13  Yes [name], MD  simvastatin (ZOCOR) 40 MG tablet Take 40 mg by mouth daily.   Yes Historical Provider, MD  sulfamethoxazole-trimethoprim (SEPTRA DS) 800-160 MG per tablet Take 1 tablet by mouth 2 (two) times daily. 11/07/13  Yes Sol Passer, MD   BP 138/74  Pulse 100  Temp(Src) 100 F (37.8 C) (Oral)  Resp 22  Ht 5' 2" (1.575 m)  Wt 169 lb 6 oz (76.828 kg)  BMI 30.97 kg/m2  SpO2 100% Physical Exam  Nursing note and vitals reviewed. Constitutional: She is oriented to person, place, and time. She appears well-developed and well-nourished.  HENT:  Head: Normocephalic and atraumatic.  Eyes: Conjunctivae are normal. Right eye exhibits no discharge. Left eye exhibits no discharge.  Neck: Normal range of motion. Neck supple. No tracheal deviation present.  Cardiovascular: Regular rhythm.  Tachycardia present.   Pulmonary/Chest: Effort normal and breath sounds normal.  Abdominal: Soft. She exhibits no distension. There is tenderness (Very mild left lower quadrant abdominal discomfort no peritonitis). There is no guarding.  Musculoskeletal: She exhibits no edema.  Neurological: She is alert and oriented to person, place, and time.  Skin: Skin is warm. No rash noted.  Psychiatric: She has a normal mood and affect.    ED Course  Procedures (including critical care time) Labs Review Labs Reviewed  CBC WITH DIFFERENTIAL - Abnormal; Notable for the following:    Neutrophils Relative % 83 (*)    Neutro Abs 8.4 (*)    All other components within normal limits  COMPREHENSIVE METABOLIC PANEL - Abnormal; Notable for the following:    Sodium 133 (*)    Chloride 93 (*)    Glucose, Bld 198 (*)    BUN 32 (*)    Creatinine, Ser 1.75 (*)    GFR calc non Af Amer 31 (*)    GFR calc Af Amer 36 (*)    All other components within normal limits  LIPASE, BLOOD  URINALYSIS, ROUTINE W REFLEX MICROSCOPIC    Imaging  Review No results found.   EKG Interpretation None      MDM   Final diagnoses:  Abdominal pain, left lower quadrant   Well-appearing female with intermittent left lower corner and abdominal pain. Patient recently had a CT scan with similar pain in no acute findings. Blood work unremarkable except for kidney function which is chronically elevated. Reviewed recent CT scan results no acute findings. Patient requesting repeat CT scan however I told her what is similar symptoms it is highly unlikely to change management and show anything new especially with her poor kidney function the IV dye could make this worse. I discussed followup with gastroenterology to discuss possible colonoscopy and further evaluation. Patient currently being treated for UTI and  recommended finishing the antibiotics that.  Results and differential diagnosis were discussed with the patient/parent/guardian. Close follow up outpatient was discussed, comfortable with the plan.   Medications  acetaminophen (TYLENOL) tablet 1,000 mg (1,000 mg Oral Given 11/09/13 1033)    Filed Vitals:   11/09/13 0933 11/09/13 0939 11/09/13 1143  BP: 166/85  138/74  Pulse: 102  100  Temp: 100 F (37.8 C)    TempSrc: Oral    Resp: 20  22  Height:  5' 2" (1.575 m)   Weight:  169 lb 6 oz (76.828 kg)   SpO2: 99%  100%    Final diagnoses:  Abdominal pain, left lower quadrant        Mariea Clonts, MD 11/09/13 1228

## 2013-11-09 NOTE — Telephone Encounter (Signed)
After hour telephone documentation  I returned Ms. Spiers's call around 2:45PM today.  She reports recently being seen in the [facility] for abdominal pain of 3 months duration.  She was discharged from the ED after negative work-up and is upset that they would not perform abdominal imaging.  She had unremarkable CT abdomen 3 weeks ago.  She tells me she is now at [facility] and says they are going to have to do something.  I explained reasons why repeat CT imaging would not be indicated for chronic pain of 3 month duration that is unchanged after normal CT recently.    Since she was already checked in at [facility] I advised she await their evaluation.  I explained that if they elected to admit her that she would likely be transferred back to [facility].  I explained that if she is not admitted she should call and arrange to be seen in clinic.  Duwaine Maxin, DO IMTS, PGY2 [phone] 11/09/2013

## 2013-11-09 NOTE — ED Notes (Signed)
Pt reports dizziness onset last night. Pt also c/o abdominal pain. Pt is being treated for UTI recently.

## 2013-11-09 NOTE — MAU Note (Signed)
PT WENT  TO [facility] ON Thursday-  FOR  LEFT SIDE PAIN- DID UA- TOLD HAD UTI- GAVE ANTX.    TOOK  THIS AM.   THEN WENT TO  [facility] AT 0900-    GAVE HER TYLENOL  . GAVE   HER RX FOR    TYLENOL  #3-  NOT FILLED YET.   SHE WANTED  HIM TO  SCAN HER COLON- HE DID NOT   SO SHE LEFT AND WENT TO [facility]-   GAVE HER MED  FOR IBS-  SOME RELIEF.     GAVE HER RX- GOT FILLED- FOR IBS.     THEN SHE  WENT BACK TO [place] AT 6PM-GAVE ZOFRAN.   THEN SHE ATE- CHEESEBURGER AND SOME YOGURT AT HOME AT 8PM.      SAYS  SHE DOES NOT FEEL GOOD   SO SHE CAME  HERE.      SHE WENT TO DR OFFICE  ON Monday- DR EPATH-INTERNAL MED AT [facility]-     DID NOTHING.     ALSO WENT LAST WEEK-     DR INCREASED INSULIN .      PT CHECKED  BS AT 930PM-   250-  TOOK INSULIN.       HAS AN APPOINTMENT  WITH GYN CLINIC-10-22       HAS AN APPOINTMENT  WITH INTERNAL DR  FOR BLOOD SUGAR.

## 2013-11-09 NOTE — Discharge Instructions (Signed)
Please call your doctor for a followup appointment within 24-48 hours. When you talk to your doctor please let them know that you were seen in the emergency department and have them acquire all of your records so that they can discuss the findings with you and formulate a treatment plan to fully care for your new and ongoing problems. ° °

## 2013-11-09 NOTE — ED Provider Notes (Signed)
CSN: [id number]     Arrival date & time 11/09/13  1400 History   First MD Initiated Contact with Patient 11/09/13 1455     Chief Complaint  Patient presents with  . Abdominal Pain     (Consider location/radiation/quality/duration/timing/severity/associated sxs/prior Treatment) HPI Comments: Pt is a 58 y/o female with hx of chronic abd pain - she has been seen multiple times for this pain including this AM when she was seen and evaluated and found to have rather benign w/u - she was d/c home to f/u with GI as outpt - the pt states that she is convinced that something is wrong b/c she continues to get LLQ abdominal pain that will come and go - is a hot feeling and lasts for a few minutes before it goes away - it has been there for months, it is always in the same location and it is not associated with vomiting, fevers or back pain.  She has no dysuria, no blood in her stools but she does endorse diarrhea and constipation, alternates between the 2.  She had her most recent CT scan in 10/19/13 without any acute findings and has had multiple CT scans with similar findings in the last 2 years - she has not had a colonoscopy.    Patient is a 58 y.o. female presenting with abdominal pain. The history is provided by the patient.  Abdominal Pain   Past Medical History  Diagnosis Date  . Diabetes mellitus type II, uncontrolled   . Hypertension   . Hyperlipidemia   . Ovarian cyst, left   . Anemia     due to menorrhagia, BL 8-10  . Vaginal cyst     nabothian and bartholin  . CKD (chronic kidney disease) stage 3, GFR 30-59 ml/min     baseline creatinine 1.4-1.7  . Anxiety   . Depression   . Postmenopausal bleeding 06/12/2008  . 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 UTI (lower urinary tract infection)   . IBS (irritable bowel syndrome)   . Chronic back pain   . Chronic abdominal pain   . DDD (degenerative disc disease), lumbar   . Bilateral renal cysts 01/15/2009   Qualifier: Diagnosis of  By: Tyrell Antonio MD, Jerald Kief     Past Surgical History  Procedure Laterality Date  . Cardiac surgery      to repair congenital defect as a child   Family History  Problem Relation Age of Onset  . Stroke Father   . Heart attack Father     Had MI in his 60s  . Stomach cancer Paternal Grandmother   . Diabetes Maternal Grandmother   . Cerebral palsy Daughter   . Anesthesia problems Neg Hx   . Hypotension Neg Hx   . Malignant hyperthermia Neg Hx   . Pseudochol deficiency Neg Hx    History  Substance Use Topics  . Smoking status: Never Smoker   . Smokeless tobacco: Former Systems developer  . Alcohol Use: No   OB History   Grav Para Term Preterm Abortions TAB SAB Ect Mult Living   3 1 1  2  2   1      Review of Systems  Gastrointestinal: Positive for abdominal pain.  All other systems reviewed and are negative.     Allergies  Review of patient's allergies indicates no known allergies.  Home Medications   Prior to Admission medications   Medication Sig Start Date End Date Taking? Authorizing Provider  aspirin EC 81 MG tablet Take 81 mg by mouth daily.   Yes Historical Provider, MD  Enalapril-Hydrochlorothiazide 5-12.5 MG per tablet Take 1 tablet by mouth daily. 11/04/13  Yes [name], MD  Insulin Detemir (LEVEMIR) 100 UNIT/ML Pen Inject 30 Units into the skin daily. ICD code 250.0, DM2 11/05/13  Yes [name], MD  pioglitazone (ACTOS) 15 MG tablet Take 1 tablet (15 mg total) by mouth daily. 11/05/13  Yes [name], MD  simvastatin (ZOCOR) 40 MG tablet Take 40 mg by mouth daily.   Yes Historical Provider, MD  sulfamethoxazole-trimethoprim (SEPTRA DS) 800-160 MG per tablet Take 1 tablet by mouth 2 (two) times daily. 11/07/13  Yes Sol Passer, MD  dicyclomine (BENTYL) 20 MG tablet Take 1 tablet (20 mg total) by mouth 2 (two) times daily. 11/09/13   Johnna Acosta, MD  HYDROcodone-acetaminophen (NORCO) 5-325 MG per tablet Take 1-2 tablets  by mouth every 4 (four) hours as needed. 11/09/13   Mariea Clonts, MD   BP 125/65  Pulse 108  Temp(Src) 98.9 F (37.2 C) (Oral)  Resp 18  SpO2 100% Physical Exam  Nursing note and vitals reviewed. Constitutional: She appears well-developed and well-nourished. No distress.  HENT:  Head: Normocephalic and atraumatic.  Mouth/Throat: Oropharynx is clear and moist. No oropharyngeal exudate.  Eyes: Conjunctivae and EOM are normal. Pupils are equal, round, and reactive to light. Right eye exhibits no discharge. Left eye exhibits no discharge. No scleral icterus.  Jaundice  Neck: Normal range of motion. Neck supple. No JVD present. No thyromegaly present.  Cardiovascular: Normal rate, regular rhythm, normal heart sounds and intact distal pulses.  Exam reveals no gallop and no friction rub.   No murmur heard. Not tachycardic on my exam  Pulmonary/Chest: Effort normal and breath sounds normal. No respiratory distress. She has no wheezes. She has no rales.  Abdominal: Soft. Bowel sounds are normal. She exhibits no distension and no mass. There is no tenderness.  Absolutely no ttp in the LLQ or anywhere else on exam.  Musculoskeletal: Normal range of motion. She exhibits no edema and no tenderness.  Lymphadenopathy:    She has no cervical adenopathy.  Neurological: She is alert. Coordination normal.  Skin: Skin is warm and dry. No rash noted. No erythema.  Psychiatric: She has a normal mood and affect. Her behavior is normal.    ED Course  Procedures (including critical care time) Labs Review Labs Reviewed - No data to display  Imaging Review No results found.  Results for orders placed during the hospital encounter of 11/09/13  CBC WITH DIFFERENTIAL      Result Value Ref Range   WBC 10.1  4.0 - 10.5 K/uL   RBC 4.19  3.87 - 5.11 MIL/uL   Hemoglobin 12.5  12.0 - 15.0 g/dL   HCT 36.1  36.0 - 46.0 %   MCV 86.2  78.0 - 100.0 fL   MCH 29.8  26.0 - 34.0 pg   MCHC 34.6  30.0 - 36.0 g/dL    RDW 11.8  11.5 - 15.5 %   Platelets 203  150 - 400 K/uL   Neutrophils Relative % 83 (*) 43 - 77 %   Neutro Abs 8.4 (*) 1.7 - 7.7 K/uL   Lymphocytes Relative 13  12 - 46 %   Lymphs Abs 1.4  0.7 - 4.0 K/uL   Monocytes Relative 4  3 - 12 %   Monocytes Absolute 0.4  0.1 - 1.0  K/uL   Eosinophils Relative 0  0 - 5 %   Eosinophils Absolute 0.0  0.0 - 0.7 K/uL   Basophils Relative 0  0 - 1 %   Basophils Absolute 0.0  0.0 - 0.1 K/uL  COMPREHENSIVE METABOLIC PANEL      Result Value Ref Range   Sodium 133 (*) 137 - 147 mEq/L   Potassium 4.1  3.7 - 5.3 mEq/L   Chloride 93 (*) 96 - 112 mEq/L   CO2 25  19 - 32 mEq/L   Glucose, Bld 198 (*) 70 - 99 mg/dL   BUN 32 (*) 6 - 23 mg/dL   Creatinine, Ser 1.75 (*) 0.50 - 1.10 mg/dL   Calcium 9.0  8.4 - 10.5 mg/dL   Total Protein 7.3  6.0 - 8.3 g/dL   Albumin 4.2  3.5 - 5.2 g/dL   AST 20  0 - 37 U/L   ALT 20  0 - 35 U/L   Alkaline Phosphatase 95  39 - 117 U/L   Total Bilirubin 0.3  0.3 - 1.2 mg/dL   GFR calc non Af Amer 31 (*) >90 mL/min   GFR calc Af Amer 36 (*) >90 mL/min   Anion gap 15  5 - 15  LIPASE, BLOOD      Result Value Ref Range   Lipase 16  11 - 59 U/L  URINALYSIS, ROUTINE W REFLEX MICROSCOPIC      Result Value Ref Range   Color, Urine YELLOW  YELLOW   APPearance CLEAR  CLEAR   Specific Gravity, Urine 1.016  1.005 - 1.030   pH 5.5  5.0 - 8.0   Glucose, UA NEGATIVE  NEGATIVE mg/dL   Hgb urine dipstick NEGATIVE  NEGATIVE   Bilirubin Urine NEGATIVE  NEGATIVE   Ketones, ur 15 (*) NEGATIVE mg/dL   Protein, ur NEGATIVE  NEGATIVE mg/dL   Urobilinogen, UA 0.2  0.0 - 1.0 mg/dL   Nitrite NEGATIVE  NEGATIVE   Leukocytes, UA SMALL (*) NEGATIVE  URINE MICROSCOPIC-ADD ON      Result Value Ref Range   Squamous Epithelial / LPF RARE  RARE   WBC, UA 0-2  <3 WBC/hpf   Bacteria, UA RARE  RARE   Ct Abdomen Pelvis W Contrast  10/19/2013   CLINICAL DATA:  Right lower quadrant abdominal pain.  EXAM: CT ABDOMEN AND PELVIS WITH CONTRAST   TECHNIQUE: Multidetector CT imaging of the abdomen and pelvis was performed using the standard protocol following bolus administration of intravenous contrast.  CONTRAST:  58mL OMNIPAQUE IOHEXOL 300 MG/ML  SOLN  COMPARISON:  01/18/2013  FINDINGS: Lung bases:  Minimal dependent atelectasis. No pleural effusion or pulmonary lesion. The heart is within normal limits in size and stable. No pericardial effusion. The distal esophagus is grossly normal.  CT abdomen:  The liver is unremarkable. No focal lesions or biliary dilatation. The gallbladder is normal. No common bile duct dilatation. The pancreas is unremarkable. Moderate to advanced atrophy of the body and tail is noted. The spleen is normal in size. No focal lesions. The adrenal glands and kidneys are unremarkable. No inflammatory changes or hydronephrosis. Stable appearing right renal cysts complicated by septations and calcification. Other slightly hyperdense renal cysts are noted but appears stable. Recommend continued surveillance.  The stomach, duodenum, small bowel and colon are unremarkable. No inflammatory changes, mass lesions or obstructive findings. The appendix is normal. No mesenteric or retroperitoneal mass or adenopathy. The aorta and branch vessels are normal and stable. The major  venous structures are patent.  CT pelvis:  Stable septate uterus. The ovaries are normal. No pelvic mass or adenopathy. The bladder is normal. No free pelvic fluid collections. No inguinal mass or adenopathy.  The bony structures are intact.  Stable degenerative changes.  IMPRESSION: No acute abdominal/ pelvic findings, mass lesions or adenopathy.  Overall stable complex bilateral renal cysts. Recommend continued surveillance.  Septate uterus.   Electronically Signed   By: Kalman Jewels M.D.   On: 10/19/2013 21:20     MDM   Final diagnoses:  Left lower quadrant pain    The pt is very nervous, she has had multiple CT scans in the past couple of years and has  never had any acute findings - she has no abd ttp and normal VS - she is no longer tachycardic at all - she is very happy with a working diagnosis of IBS - I doubt there is any other more significant pathology.  I have reviewed her labs from earlier in the day and find nothing that is of any concern.  The pt is agreeable to Bentyl and f/u - she has been reassured that she does not have an acute process.  She will return if her sx worsen.  Meds given in ED:  Medications  dicyclomine (BENTYL) capsule 20 mg (not administered)    New Prescriptions   DICYCLOMINE (BENTYL) 20 MG TABLET    Take 1 tablet (20 mg total) by mouth 2 (two) times daily.        Johnna Acosta, MD 11/09/13 [phone]

## 2013-11-09 NOTE — ED Notes (Signed)
Pt states that she has been at [facility] all morning.  Was discharged about an hour ago. Pt states that she will not leave here until we tell her what is wrong with her.  States that she asked them to "check her colon" and "they wouldn't".  Pt states that she has been having LLQ pain x greater than 6 months.  States that she just noticed a vaginal discharge (pale yellow).

## 2013-11-10 NOTE — MAU Note (Signed)
PT WANTED TO GO HOME- CANNOT WAIT ANY  LONGER-  HAS TO GET DAUGHTER HOME.

## 2013-11-11 ENCOUNTER — Emergency Department (HOSPITAL_COMMUNITY): Payer: No Typology Code available for payment source

## 2013-11-11 ENCOUNTER — Emergency Department (HOSPITAL_COMMUNITY)
Admission: EM | Admit: 2013-11-11 | Discharge: 2013-11-11 | Disposition: A | Discharge status: Home / Self Care | Payer: No Typology Code available for payment source | Attending: Emergency Medicine | Admitting: Emergency Medicine

## 2013-11-11 ENCOUNTER — Encounter (HOSPITAL_COMMUNITY): Payer: Self-pay | Admitting: Emergency Medicine

## 2013-11-11 DIAGNOSIS — Q249 Congenital malformation of heart, unspecified: Secondary | ICD-10-CM | POA: Insufficient documentation

## 2013-11-11 DIAGNOSIS — F419 Anxiety disorder, unspecified: Secondary | ICD-10-CM | POA: Insufficient documentation

## 2013-11-11 DIAGNOSIS — N183 Chronic kidney disease, stage 3 (moderate): Secondary | ICD-10-CM | POA: Insufficient documentation

## 2013-11-11 DIAGNOSIS — M5136 Other intervertebral disc degeneration, lumbar region: Secondary | ICD-10-CM | POA: Insufficient documentation

## 2013-11-11 DIAGNOSIS — R1032 Left lower quadrant pain: Secondary | ICD-10-CM | POA: Insufficient documentation

## 2013-11-11 DIAGNOSIS — N184 Chronic kidney disease, stage 4 (severe): Secondary | ICD-10-CM

## 2013-11-11 DIAGNOSIS — Z7982 Long term (current) use of aspirin: Secondary | ICD-10-CM | POA: Insufficient documentation

## 2013-11-11 DIAGNOSIS — Z862 Personal history of diseases of the blood and blood-forming organs and certain disorders involving the immune mechanism: Secondary | ICD-10-CM | POA: Insufficient documentation

## 2013-11-11 DIAGNOSIS — E119 Type 2 diabetes mellitus without complications: Secondary | ICD-10-CM | POA: Insufficient documentation

## 2013-11-11 DIAGNOSIS — Z87440 Personal history of urinary (tract) infections: Secondary | ICD-10-CM | POA: Insufficient documentation

## 2013-11-11 DIAGNOSIS — E785 Hyperlipidemia, unspecified: Secondary | ICD-10-CM | POA: Insufficient documentation

## 2013-11-11 DIAGNOSIS — Q6102 Congenital multiple renal cysts: Secondary | ICD-10-CM | POA: Insufficient documentation

## 2013-11-11 DIAGNOSIS — Z8719 Personal history of other diseases of the digestive system: Secondary | ICD-10-CM | POA: Insufficient documentation

## 2013-11-11 DIAGNOSIS — Z794 Long term (current) use of insulin: Secondary | ICD-10-CM | POA: Insufficient documentation

## 2013-11-11 DIAGNOSIS — Z79899 Other long term (current) drug therapy: Secondary | ICD-10-CM | POA: Insufficient documentation

## 2013-11-11 DIAGNOSIS — R110 Nausea: Secondary | ICD-10-CM | POA: Insufficient documentation

## 2013-11-11 DIAGNOSIS — Z792 Long term (current) use of antibiotics: Secondary | ICD-10-CM | POA: Insufficient documentation

## 2013-11-11 DIAGNOSIS — I129 Hypertensive chronic kidney disease with stage 1 through stage 4 chronic kidney disease, or unspecified chronic kidney disease: Secondary | ICD-10-CM | POA: Insufficient documentation

## 2013-11-11 DIAGNOSIS — G8929 Other chronic pain: Secondary | ICD-10-CM | POA: Insufficient documentation

## 2013-11-11 LAB — COMPREHENSIVE METABOLIC PANEL
ALT: 17 U/L (ref 0–35)
AST: 21 U/L (ref 0–37)
Albumin: 4.2 g/dL (ref 3.5–5.2)
Alkaline Phosphatase: 84 U/L (ref 39–117)
Anion gap: 16 — ABNORMAL HIGH (ref 5–15)
BILIRUBIN TOTAL: 0.3 mg/dL (ref 0.3–1.2)
BUN: 35 mg/dL — ABNORMAL HIGH (ref 6–23)
CHLORIDE: 87 meq/L — AB (ref 96–112)
CO2: 25 meq/L (ref 19–32)
CREATININE: 2.35 mg/dL — AB (ref 0.50–1.10)
Calcium: 9.3 mg/dL (ref 8.4–10.5)
GFR calc Af Amer: 25 mL/min — ABNORMAL LOW (ref >90)
GFR calc non Af Amer: 22 mL/min — ABNORMAL LOW (ref >90)
Glucose, Bld: 171 mg/dL — ABNORMAL HIGH (ref 70–99)
POTASSIUM: 4.8 meq/L (ref 3.7–5.3)
Sodium: 128 mEq/L — ABNORMAL LOW (ref 137–147)
Total Protein: 7.4 g/dL (ref 6.0–8.3)

## 2013-11-11 LAB — CBC WITH DIFFERENTIAL/PLATELET
BASOS ABS: 0 10*3/uL (ref 0.0–0.1)
Basophils Relative: 0 % (ref 0–1)
Eosinophils Absolute: 0 10*3/uL (ref 0.0–0.7)
Eosinophils Relative: 0 % (ref 0–5)
HEMATOCRIT: 34.5 % — AB (ref 36.0–46.0)
HEMOGLOBIN: 12.2 g/dL (ref 12.0–15.0)
LYMPHS PCT: 8 % — AB (ref 12–46)
Lymphs Abs: 0.8 10*3/uL (ref 0.7–4.0)
MCH: 29.9 pg (ref 26.0–34.0)
MCHC: 35.4 g/dL (ref 30.0–36.0)
MCV: 84.6 fL (ref 78.0–100.0)
MONO ABS: 0.3 10*3/uL (ref 0.1–1.0)
Monocytes Relative: 3 % (ref 3–12)
NEUTROS PCT: 89 % — AB (ref 43–77)
Neutro Abs: 8.7 10*3/uL — ABNORMAL HIGH (ref 1.7–7.7)
Platelets: 210 10*3/uL (ref 150–400)
RBC: 4.08 MIL/uL (ref 3.87–5.11)
RDW: 11.7 % (ref 11.5–15.5)
WBC: 9.7 10*3/uL (ref 4.0–10.5)

## 2013-11-11 LAB — LIPASE, BLOOD: Lipase: 18 U/L (ref 11–59)

## 2013-11-11 MED ORDER — ONDANSETRON HCL 4 MG PO TABS
4.0000 mg | ORAL_TABLET | Freq: Once | ORAL | Status: AC
  Administered 2013-11-11: 4 mg via ORAL
  Filled 2013-11-11: qty 1

## 2013-11-11 MED ORDER — KETOROLAC TROMETHAMINE 30 MG/ML IJ SOLN: 30.0000 mg | Freq: Once | INTRAMUSCULAR | Status: DC

## 2013-11-11 MED ORDER — SODIUM CHLORIDE 0.9 % IV BOLUS (SEPSIS): 1000.0000 mL | Freq: Once | INTRAVENOUS | Status: DC

## 2013-11-11 MED ORDER — SODIUM CHLORIDE 0.9 % IV BOLUS (SEPSIS)
1000.0000 mL | Freq: Once | INTRAVENOUS | Status: AC
  Administered 2013-11-11: 1000 mL via INTRAVENOUS

## 2013-11-11 MED ORDER — TRAMADOL HCL 50 MG PO TABS
50.0000 mg | ORAL_TABLET | Freq: Once | ORAL | Status: AC
  Administered 2013-11-11: 50 mg via ORAL
  Filled 2013-11-11: qty 1

## 2013-11-11 NOTE — Discharge Instructions (Signed)
Abdominal Pain, Women °Abdominal (stomach, pelvic, or belly) pain can be caused by many things. It is important to tell your doctor: °· The location of the pain. °· Does it come and go or is it present all the time? °· Are there things that start the pain (eating certain foods, exercise)? °· Are there other symptoms associated with the pain (fever, nausea, vomiting, diarrhea)? °All of this is helpful to know when trying to find the cause of the pain. °CAUSES  °· Stomach: virus or bacteria infection, or ulcer. °· Intestine: appendicitis (inflamed appendix), regional ileitis (Crohn's disease), ulcerative colitis (inflamed colon), irritable bowel syndrome, diverticulitis (inflamed diverticulum of the colon), or cancer of the stomach or intestine. °· Gallbladder disease or stones in the gallbladder. °· Kidney disease, kidney stones, or infection. °· Pancreas infection or cancer. °· Fibromyalgia (pain disorder). °· Diseases of the female organs: °¨ Uterus: fibroid (non-cancerous) tumors or infection. °¨ Fallopian tubes: infection or tubal pregnancy. °¨ Ovary: cysts or tumors. °¨ Pelvic adhesions (scar tissue). °¨ Endometriosis (uterus lining tissue growing in the pelvis and on the pelvic organs). °¨ Pelvic congestion syndrome (female organs filling up with blood just before the menstrual period). °¨ Pain with the menstrual period. °¨ Pain with ovulation (producing an egg). °¨ Pain with an IUD (intrauterine device, birth control) in the uterus. °¨ Cancer of the female organs. °· Functional pain (pain not caused by a disease, may improve without treatment). °· Psychological pain. °· Depression. °DIAGNOSIS  °Your doctor will decide the seriousness of your pain by doing an examination. °· Blood tests. °· X-rays. °· Ultrasound. °· CT scan (computed tomography, special type of X-ray). °· MRI (magnetic resonance imaging). °· Cultures, for infection. °· Barium enema (dye inserted in the large intestine, to better view it with  X-rays). °· Colonoscopy (looking in intestine with a lighted tube). °· Laparoscopy (minor surgery, looking in abdomen with a lighted tube). °· Major abdominal exploratory surgery (looking in abdomen with a large incision). °TREATMENT  °The treatment will depend on the cause of the pain.  °· Many cases can be observed and treated at home. °· Over-the-counter medicines recommended by your caregiver. °· Prescription medicine. °· Antibiotics, for infection. °· Birth control pills, for painful periods or for ovulation pain. °· Hormone treatment, for endometriosis. °· Nerve blocking injections. °· Physical therapy. °· Antidepressants. °· Counseling with a psychologist or psychiatrist. °· Minor or major surgery. °HOME CARE INSTRUCTIONS  °· Do not take laxatives, unless directed by your caregiver. °· Take over-the-counter pain medicine only if ordered by your caregiver. Do not take aspirin because it can cause an upset stomach or bleeding. °· Try a clear liquid diet (broth or water) as ordered by your caregiver. Slowly move to a bland diet, as tolerated, if the pain is related to the stomach or intestine. °· Have a thermometer and take your temperature several times a day, and record it. °· Bed rest and sleep, if it helps the pain. °· Avoid sexual intercourse, if it causes pain. °· Avoid stressful situations. °· Keep your follow-up appointments and tests, as your caregiver orders. °· If the pain does not go away with medicine or surgery, you may try: °¨ Acupuncture. °¨ Relaxation exercises (yoga, meditation). °¨ Group therapy. °¨ Counseling. °SEEK MEDICAL CARE IF:  °· You notice certain foods cause stomach pain. °· Your home care treatment is not helping your pain. °· You need stronger pain medicine. °· You want your IUD removed. °· You feel faint or   lightheaded. °· You develop nausea and vomiting. °· You develop a rash. °· You are having side effects or an allergy to your medicine. °SEEK IMMEDIATE MEDICAL CARE IF:  °· Your  pain does not go away or gets worse. °· You have a fever. °· Your pain is felt only in portions of the abdomen. The right side could possibly be appendicitis. The left lower portion of the abdomen could be colitis or diverticulitis. °· You are passing blood in your stools (bright red or black tarry stools, with or without vomiting). °· You have blood in your urine. °· You develop chills, with or without a fever. °· You pass out. °MAKE SURE YOU:  °· Understand these instructions. °· Will watch your condition. °· Will get help right away if you are not doing well or get worse. °Document Released: 11/21/2006 Document Revised: 06/10/2013 Document Reviewed: 12/11/2008 °ExitCare® Patient Information ©2015 [employer]. This information is not intended to replace advice given to you by your health care provider. Make sure you discuss any questions you have with your health care provider. ° °

## 2013-11-11 NOTE — ED Provider Notes (Signed)
CSN: [id number]     Arrival date & time 11/11/13  0919 History   First MD Initiated Contact with Patient 11/11/13 [phone]     Chief Complaint  Patient presents with  . Abdominal Pain   Patient is a 58 y.o. female presenting with abdominal pain. The history is provided by the patient. No language interpreter was used.  Abdominal Pain Pain location:  LLQ Pain quality: burning (warmth)   Pain radiates to:  Does not radiate Pain severity:  Moderate Onset quality:  Unable to specify Duration: a long time. Timing:  Intermittent Progression:  Waxing and waning Chronicity:  Chronic (has been seen for this multiple times here in the ED, with last visit on 11/09/13) Context: not alcohol use, not awakening from sleep, not diet changes, not eating, not laxative use, not medication withdrawal, not previous surgeries, not recent illness, not recent sexual activity, not recent travel, not retching, not sick contacts, not suspicious food intake and not trauma   Context comment:  Stress and emotional upset Relieved by:  Nothing Exacerbated by: emotional distress. Ineffective treatments:  Acetaminophen and OTC medications (bentyl and zofran) Associated symptoms: fatigue and nausea   Associated symptoms: no anorexia, no belching, no chest pain, no chills, no constipation, no cough, no diarrhea, no dysuria, no fever, no flatus, no hematemesis, no hematochezia, no hematuria, no melena, no shortness of breath, no sore throat, no vaginal bleeding, no vaginal discharge and no vomiting   Risk factors: obesity   Risk factors: no alcohol abuse, no aspirin use, not elderly, has not had multiple surgeries, no NSAID use, not pregnant and no recent hospitalization    Chart review reveals that the patient has been to the ED 11 times in the past six months.  Patient has been seen multiple times for chronic abdomina pain and has been referred to GI multiple times but the patient has not gone as "I don't have time for that.   You all need to figure out whats wrong with me!"  Patient recently had a CT scan of the abdomen and pelvis on 10/19/13 with no evidence of any acute abnormalities.    Past Medical History  Diagnosis Date  . Diabetes mellitus type II, uncontrolled   . Hypertension   . Hyperlipidemia   . Ovarian cyst, left   . Anemia     due to menorrhagia, BL 8-10  . Vaginal cyst     nabothian and bartholin  . CKD (chronic kidney disease) stage 3, GFR 30-59 ml/min     baseline creatinine 1.4-1.7  . Anxiety   . Depression   . Postmenopausal bleeding 06/12/2008  . 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 UTI (lower urinary tract infection)   . IBS (irritable bowel syndrome)   . Chronic back pain   . Chronic abdominal pain   . DDD (degenerative disc disease), lumbar   . Bilateral renal cysts 01/15/2009    Qualifier: Diagnosis of  By: Tyrell Antonio MD, Jerald Kief     Past Surgical History  Procedure Laterality Date  . Cardiac surgery      to repair congenital defect as a child   Family History  Problem Relation Age of Onset  . Stroke Father   . Heart attack Father     Had MI in his 81s  . Stomach cancer Paternal Grandmother   . Diabetes Maternal Grandmother   . Cerebral palsy Daughter   . Anesthesia problems Neg Hx   .  Hypotension Neg Hx   . Malignant hyperthermia Neg Hx   . Pseudochol deficiency Neg Hx    History  Substance Use Topics  . Smoking status: Never Smoker   . Smokeless tobacco: Former Systems developer  . Alcohol Use: No   OB History   Grav Para Term Preterm Abortions TAB SAB Ect Mult Living   3 1 0  2  2   1      Review of Systems  Constitutional: Positive for fatigue. Negative for fever and chills.  HENT: Negative for sore throat.   Respiratory: Negative for cough and shortness of breath.   Cardiovascular: Negative for chest pain.  Gastrointestinal: Positive for nausea and abdominal pain. Negative for vomiting, diarrhea, constipation, melena, hematochezia, anorexia,  flatus and hematemesis.  Genitourinary: Negative for dysuria, hematuria, vaginal bleeding and vaginal discharge.  All other systems reviewed and are negative.     Allergies  Review of patient's allergies indicates no known allergies.  Home Medications   Prior to Admission medications   Medication Sig Start Date End Date Taking? Authorizing Provider  aspirin EC 81 MG tablet Take 81 mg by mouth daily.   Yes Historical Provider, MD  dicyclomine (BENTYL) 20 MG tablet Take 20 mg by mouth 2 (two) times daily.   Yes Historical Provider, MD  Enalapril-Hydrochlorothiazide 5-12.5 MG per tablet Take 1 tablet by mouth daily.   Yes Historical Provider, MD  HYDROcodone-acetaminophen (NORCO/VICODIN) 5-325 MG per tablet Take 1-2 tablets by mouth every 4 (four) hours as needed for moderate pain.   Yes Historical Provider, MD  insulin detemir (LEVEMIR) 100 UNIT/ML injection Inject 30 Units into the skin at bedtime.   Yes Historical Provider, MD  ondansetron (ZOFRAN) 4 MG tablet Take 1 tablet (4 mg total) by mouth every 6 (six) hours. 11/09/13  Yes Viona Gilmore Cartner, PA-C  pioglitazone (ACTOS) 15 MG tablet Take 15 mg by mouth daily.   Yes Historical Provider, MD  simvastatin (ZOCOR) 40 MG tablet Take 40 mg by mouth daily.   Yes Historical Provider, MD  sulfamethoxazole-trimethoprim (BACTRIM DS) 800-160 MG per tablet Take 1 tablet by mouth 2 (two) times daily. x7 days. Expected completion date 11/17/2013   Yes Historical Provider, MD   BP 127/69  Pulse 106  Temp(Src) 98.1 F (36.7 C) (Oral)  Resp 18  Ht 5\' 4"  (1.626 m)  Wt 160 lb (72.576 kg)  BMI 27.45 kg/m2  SpO2 96% Physical Exam  Nursing note and vitals reviewed. Constitutional: She is oriented to person, place, and time. She appears well-developed and well-nourished. No distress.  HENT:  Head: Normocephalic and atraumatic.  Mouth/Throat: Oropharynx is clear and moist. No oropharyngeal exudate.  Eyes: Conjunctivae and EOM are normal. Pupils are  equal, round, and reactive to light. No scleral icterus.  Neck: Normal range of motion. Neck supple. No JVD present. No thyromegaly present.  Cardiovascular: Normal rate, regular rhythm, normal heart sounds and intact distal pulses.  Exam reveals no gallop and no friction rub.   No murmur heard. Pulmonary/Chest: Effort normal and breath sounds normal. No respiratory distress. She has no wheezes. She has no rales. She exhibits no tenderness.  Abdominal: Soft. Normal appearance and bowel sounds are normal. She exhibits no distension and no mass. There is no tenderness. There is no rigidity, no rebound, no guarding, no CVA tenderness, no tenderness at McBurney's point and negative Murphy's sign.  Musculoskeletal: Normal range of motion.  Lymphadenopathy:    She has no cervical adenopathy.  Neurological: She is alert  and oriented to person, place, and time. She has normal strength. No cranial nerve deficit or sensory deficit. Coordination normal.  Skin: Skin is warm and dry. She is not diaphoretic.  Psychiatric: Judgment and thought content normal. Her mood appears anxious. Her speech is rapid and/or pressured. She is agitated. Cognition and memory are normal. She expresses no homicidal and no suicidal ideation. She expresses no suicidal plans and no homicidal plans.  Crying and shaking on exam, but was resting comfortably    ED Course  Procedures (including critical care time) Labs Review Labs Reviewed  CBC WITH DIFFERENTIAL - Abnormal; Notable for the following:    HCT 34.5 (*)    Neutrophils Relative % 89 (*)    Neutro Abs 8.7 (*)    Lymphocytes Relative 8 (*)    All other components within normal limits  COMPREHENSIVE METABOLIC PANEL - Abnormal; Notable for the following:    Sodium 128 (*)    Chloride 87 (*)    Glucose, Bld 171 (*)    BUN 35 (*)    Creatinine, Ser 2.35 (*)    GFR calc non Af Amer 22 (*)    GFR calc Af Amer 25 (*)    Anion gap 16 (*)    All other components within  normal limits  LIPASE, BLOOD  URINALYSIS, ROUTINE W REFLEX MICROSCOPIC    Imaging Review Dg Abd 2 Views  11/11/2013   CLINICAL DATA:  Intermittent right lower abdominal pain for several weeks. Associated nausea and decreased appetite.  EXAM: ABDOMEN - 2 VIEW  COMPARISON:  None.  FINDINGS: A fair amount of stool is seen in the colon. No small bowel dilatation. No unexpected radiopaque calculi. Visualized portion of the lower chest is unremarkable.  IMPRESSION: Bowel gas pattern is indicative of constipation.   Electronically Signed   By: Lorin Picket M.D.   On: 11/11/2013 10:21     EKG Interpretation None      MDM   Final diagnoses:  Chronic LLQ pain  Anxiety  CKD (chronic kidney disease), stage 4 (severe)   Patient is a 58 y.o. Female who presents to the ED with LLQ abdominal pain.  Patient is anxious and agitated.  Physical exam reveals non-toxic appearing female with minimally tender abdomen.  Patient treated here with tramadol and 1 L NS bolus.  Patient felt much better after treatment.  CBC is at baseline with no leukocytosis.  CMP reveals mildly increased SCr and mildly decreased GFR from baseline CKD.  Lipase is unremarkable.  Abdominal xray reveals mild constipation with no evidence of bowel obstruction.  Doubt appendicitis or diverticulitis given exam, labs, and history.  Suspect IBS.  Patient offered pain medication which she has declined.  Will refer to GI.  Patient was told to drink plenty of fluids and to use stool softener as needed.  Patient is stable for discharge at this time.      Cherylann Parr, PA-C 11/11/13 1320

## 2013-11-11 NOTE — ED Notes (Signed)
To ED via [facility] from home with sudden onset of left lower quad pain-- here on Saturday for similar sx (at [facility]). Denies diarrhea, no vomiting--

## 2013-11-11 NOTE — ED Notes (Signed)
Pt ambulated to the BR without difficulty

## 2013-11-11 NOTE — ED Provider Notes (Signed)
Medical screening examination/treatment/procedure(s) were performed by non-physician practitioner and as supervising physician I was immediately available for consultation/collaboration.     Veryl Speak, MD 11/11/13 [phone]

## 2013-11-12 ENCOUNTER — Ambulatory Visit (INDEPENDENT_AMBULATORY_CARE_PROVIDER_SITE_OTHER): Payer: No Typology Code available for payment source | Admitting: Internal Medicine

## 2013-11-12 ENCOUNTER — Emergency Department (HOSPITAL_COMMUNITY): Payer: No Typology Code available for payment source

## 2013-11-12 ENCOUNTER — Encounter (HOSPITAL_COMMUNITY): Payer: Self-pay | Admitting: Emergency Medicine

## 2013-11-12 ENCOUNTER — Encounter: Payer: No Typology Code available for payment source | Admitting: Internal Medicine

## 2013-11-12 ENCOUNTER — Emergency Department (HOSPITAL_COMMUNITY)
Admission: EM | Admit: 2013-11-12 | Discharge: 2013-11-12 | Disposition: A | Discharge status: Home / Self Care | Payer: No Typology Code available for payment source | Attending: Emergency Medicine | Admitting: Emergency Medicine

## 2013-11-12 ENCOUNTER — Encounter: Payer: Self-pay | Admitting: Internal Medicine

## 2013-11-12 ENCOUNTER — Ambulatory Visit: Payer: No Typology Code available for payment source | Admitting: Internal Medicine

## 2013-11-12 VITALS — BP 145/70 | HR 99 | Temp 98.2°F | Ht 64.0 in | Wt 162.8 lb

## 2013-11-12 DIAGNOSIS — Z8774 Personal history of (corrected) congenital malformations of heart and circulatory system: Secondary | ICD-10-CM | POA: Insufficient documentation

## 2013-11-12 DIAGNOSIS — Z8742 Personal history of other diseases of the female genital tract: Secondary | ICD-10-CM | POA: Insufficient documentation

## 2013-11-12 DIAGNOSIS — M5136 Other intervertebral disc degeneration, lumbar region: Secondary | ICD-10-CM | POA: Insufficient documentation

## 2013-11-12 DIAGNOSIS — N390 Urinary tract infection, site not specified: Secondary | ICD-10-CM

## 2013-11-12 DIAGNOSIS — Z794 Long term (current) use of insulin: Secondary | ICD-10-CM | POA: Insufficient documentation

## 2013-11-12 DIAGNOSIS — N183 Chronic kidney disease, stage 3 (moderate): Secondary | ICD-10-CM | POA: Insufficient documentation

## 2013-11-12 DIAGNOSIS — I129 Hypertensive chronic kidney disease with stage 1 through stage 4 chronic kidney disease, or unspecified chronic kidney disease: Secondary | ICD-10-CM | POA: Insufficient documentation

## 2013-11-12 DIAGNOSIS — R1030 Lower abdominal pain, unspecified: Secondary | ICD-10-CM | POA: Insufficient documentation

## 2013-11-12 DIAGNOSIS — Z862 Personal history of diseases of the blood and blood-forming organs and certain disorders involving the immune mechanism: Secondary | ICD-10-CM | POA: Insufficient documentation

## 2013-11-12 DIAGNOSIS — E119 Type 2 diabetes mellitus without complications: Secondary | ICD-10-CM | POA: Insufficient documentation

## 2013-11-12 DIAGNOSIS — G8929 Other chronic pain: Secondary | ICD-10-CM | POA: Insufficient documentation

## 2013-11-12 DIAGNOSIS — N189 Chronic kidney disease, unspecified: Secondary | ICD-10-CM

## 2013-11-12 DIAGNOSIS — R1032 Left lower quadrant pain: Secondary | ICD-10-CM

## 2013-11-12 DIAGNOSIS — E1122 Type 2 diabetes mellitus with diabetic chronic kidney disease: Secondary | ICD-10-CM

## 2013-11-12 DIAGNOSIS — F419 Anxiety disorder, unspecified: Secondary | ICD-10-CM | POA: Insufficient documentation

## 2013-11-12 DIAGNOSIS — E871 Hypo-osmolality and hyponatremia: Secondary | ICD-10-CM | POA: Insufficient documentation

## 2013-11-12 DIAGNOSIS — E785 Hyperlipidemia, unspecified: Secondary | ICD-10-CM | POA: Insufficient documentation

## 2013-11-12 DIAGNOSIS — Z7982 Long term (current) use of aspirin: Secondary | ICD-10-CM | POA: Insufficient documentation

## 2013-11-12 DIAGNOSIS — Z792 Long term (current) use of antibiotics: Secondary | ICD-10-CM | POA: Insufficient documentation

## 2013-11-12 DIAGNOSIS — Z8719 Personal history of other diseases of the digestive system: Secondary | ICD-10-CM | POA: Insufficient documentation

## 2013-11-12 DIAGNOSIS — F329 Major depressive disorder, single episode, unspecified: Secondary | ICD-10-CM | POA: Insufficient documentation

## 2013-11-12 LAB — CBC WITH DIFFERENTIAL/PLATELET
BASOS PCT: 0 % (ref 0–1)
Basophils Absolute: 0 10*3/uL (ref 0.0–0.1)
EOS ABS: 0 10*3/uL (ref 0.0–0.7)
EOS PCT: 0 % (ref 0–5)
HCT: 33.4 % — ABNORMAL LOW (ref 36.0–46.0)
Hemoglobin: 12.1 g/dL (ref 12.0–15.0)
Lymphocytes Relative: 26 % (ref 12–46)
Lymphs Abs: 1.6 10*3/uL (ref 0.7–4.0)
MCH: 30.3 pg (ref 26.0–34.0)
MCHC: 36.2 g/dL — AB (ref 30.0–36.0)
MCV: 83.7 fL (ref 78.0–100.0)
Monocytes Absolute: 0.4 10*3/uL (ref 0.1–1.0)
Monocytes Relative: 7 % (ref 3–12)
Neutro Abs: 4 10*3/uL (ref 1.7–7.7)
Neutrophils Relative %: 67 % (ref 43–77)
Platelets: 204 10*3/uL (ref 150–400)
RBC: 3.99 MIL/uL (ref 3.87–5.11)
RDW: 11.6 % (ref 11.5–15.5)
WBC: 6.1 10*3/uL (ref 4.0–10.5)

## 2013-11-12 LAB — COMPREHENSIVE METABOLIC PANEL
ALT: 17 U/L (ref 0–35)
AST: 24 U/L (ref 0–37)
Albumin: 4.1 g/dL (ref 3.5–5.2)
Alkaline Phosphatase: 79 U/L (ref 39–117)
Anion gap: 19 — ABNORMAL HIGH (ref 5–15)
BUN: 27 mg/dL — ABNORMAL HIGH (ref 6–23)
CO2: 21 mEq/L (ref 19–32)
Calcium: 9 mg/dL (ref 8.4–10.5)
Chloride: 84 mEq/L — ABNORMAL LOW (ref 96–112)
Creatinine, Ser: 1.95 mg/dL — ABNORMAL HIGH (ref 0.50–1.10)
GFR calc Af Amer: 32 mL/min — ABNORMAL LOW (ref >90)
GFR calc non Af Amer: 28 mL/min — ABNORMAL LOW (ref >90)
Glucose, Bld: 120 mg/dL — ABNORMAL HIGH (ref 70–99)
Potassium: 3.5 mEq/L — ABNORMAL LOW (ref 3.7–5.3)
Sodium: 124 mEq/L — ABNORMAL LOW (ref 137–147)
TOTAL PROTEIN: 7.1 g/dL (ref 6.0–8.3)
Total Bilirubin: 0.5 mg/dL (ref 0.3–1.2)

## 2013-11-12 LAB — NA AND K (SODIUM & POTASSIUM), RAND UR
Potassium Urine: 15 mEq/L
Sodium, Ur: 82 mEq/L

## 2013-11-12 LAB — URINE MICROSCOPIC-ADD ON

## 2013-11-12 LAB — URINALYSIS, ROUTINE W REFLEX MICROSCOPIC
Bilirubin Urine: NEGATIVE
GLUCOSE, UA: NEGATIVE mg/dL
Ketones, ur: NEGATIVE mg/dL
Nitrite: NEGATIVE
PROTEIN: NEGATIVE mg/dL
Specific Gravity, Urine: 1.01 (ref 1.005–1.030)
Urobilinogen, UA: 0.2 mg/dL (ref 0.0–1.0)
pH: 5 (ref 5.0–8.0)

## 2013-11-12 LAB — LIPASE, BLOOD: Lipase: 16 U/L (ref 11–59)

## 2013-11-12 MED ORDER — CIPROFLOXACIN HCL 500 MG PO TABS
500.0000 mg | ORAL_TABLET | Freq: Two times a day (BID) | ORAL | Status: DC
Start: 2013-11-12 — End: 2013-11-13

## 2013-11-12 MED ORDER — POLYETHYLENE GLYCOL 3350 17 G PO PACK: 17.0000 g | PACK | Freq: Every day | ORAL | Status: DC

## 2013-11-12 MED ORDER — DOCUSATE SODIUM 100 MG PO CAPS: 100.0000 mg | ORAL_CAPSULE | Freq: Two times a day (BID) | ORAL | Status: DC

## 2013-11-12 MED ORDER — SODIUM CHLORIDE 0.9 % IV SOLN
INTRAVENOUS | Status: DC
  Administered 2013-11-12: 10:00:00 via INTRAVENOUS

## 2013-11-12 MED ORDER — DEXTROSE 5 % IV SOLN
1.0000 g | Freq: Once | INTRAVENOUS | Status: AC
  Administered 2013-11-12: 1 g via INTRAVENOUS
  Filled 2013-11-12: qty 10

## 2013-11-12 MED ORDER — ENALAPRIL MALEATE 10 MG PO TABS: 10.0000 mg | ORAL_TABLET | Freq: Every day | ORAL | Status: DC

## 2013-11-12 MED ORDER — PHENAZOPYRIDINE HCL 200 MG PO TABS
200.0000 mg | ORAL_TABLET | Freq: Three times a day (TID) | ORAL | Status: DC
  Administered 2013-11-12: 200 mg via ORAL
  Filled 2013-11-12: qty 1

## 2013-11-12 MED ORDER — PHENAZOPYRIDINE HCL 200 MG PO TABS: 200.0000 mg | ORAL_TABLET | Freq: Three times a day (TID) | ORAL | Status: DC | PRN

## 2013-11-12 NOTE — ED Notes (Signed)
MD at bedside. 

## 2013-11-12 NOTE — Progress Notes (Signed)
[place],  [place] with patient about [employer]. Patient pcp is Dr. Denton Brick at [facility]. Patient has upcoming apt with pcp on 10/20 at 2:15 pm. CL provided pt with apt information. CL will refer patient to [facility] for further f/u.

## 2013-11-12 NOTE — Discharge Instructions (Signed)
Hyponatremia ° Hyponatremia is when the salt (sodium) in your blood is low. When salt becomes low, your cells take in extra water and puff up (swell). The puffiness can happen in the whole body. It mostly affects the brain and is very serious.  °HOME CARE °· Only take medicine as told by your doctor. °· Follow any diet instructions you were given. This includes limiting how much fluid you drink. °· Keep all doctor visits for tests as told. °· Avoid alcohol and drugs. °GET HELP RIGHT AWAY IF: °· You start to twitch and shake (seize). °· You pass out (faint). °· You continue to have watery poop (diarrhea) or you throw up (vomit). °· You feel sick to your stomach (nauseous). °· You are tired (fatigued), have a headache, are confused, or feel weak. °· Your problems that first brought you to the doctor come back. °· You have trouble following your diet instructions. °MAKE SURE YOU:  °· Understand these instructions. °· Will watch your condition. °· Will get help right away if you are not doing well or get worse. °Document Released: 10/06/2010 Document Revised: 04/18/2011 Document Reviewed: 10/06/2010 °ExitCare® Patient Information ©2015 [employer]. This information is not intended to replace advice given to you by your health care provider. Make sure you discuss any questions you have with your health care provider. ° °Urinary Tract Infection °A urinary tract infection (UTI) can occur any place along the urinary tract. The tract includes the kidneys, ureters, bladder, and urethra. A type of germ called bacteria often causes a UTI. UTIs are often helped with antibiotic medicine.  °HOME CARE  °· If given, take antibiotics as told by your doctor. Finish them even if you start to feel better. °· Drink enough fluids to keep your pee (urine) clear or pale yellow. °· Avoid tea, drinks with caffeine, and bubbly (carbonated) drinks. °· Pee often. Avoid holding your pee in for a long time. °· Pee before and after having sex  (intercourse). °· Wipe from front to back after you poop (bowel movement) if you are a woman. Use each tissue only once. °GET HELP RIGHT AWAY IF:  °· You have back pain. °· You have lower belly (abdominal) pain. °· You have chills. °· You feel sick to your stomach (nauseous). °· You throw up (vomit). °· Your burning or discomfort with peeing does not go away. °· You have a fever. °· Your symptoms are not better in 3 days. °MAKE SURE YOU:  °· Understand these instructions. °· Will watch your condition. °· Will get help right away if you are not doing well or get worse. °Document Released: 07/13/2007 Document Revised: 10/19/2011 Document Reviewed: 08/25/2011 °ExitCare® Patient Information ©2015 [employer]. This information is not intended to replace advice given to you by your health care provider. Make sure you discuss any questions you have with your health care provider. ° °

## 2013-11-12 NOTE — Assessment & Plan Note (Signed)
BP Readings from Last 3 Encounters:  11/12/13 145/70  11/12/13 142/68  11/11/13 127/69    Lab Results  Component Value Date   NA 124* 11/12/2013   K 3.5* 11/12/2013   CREATININE 1.95* 11/12/2013    Assessment: Blood pressure control:  Acceptable Progress toward BP goal:   Fair Comments: Did not take- Enalapril-HCTZ today- 5-12.5mg .  Plan: Medications:  Will restart Enalapril- 10mg  daily, will help with K. Hyponatremia likely related to Poor PO intake, and ongoing HCTZ use. Educational resources provided:   Self management tools provided:   Other plans: Pt to check BP, at least 1ce a day and come with log. Check serum and urine osmolality in 1 week, if Na still persistently low- will not check today, as na drop has been only over the past day. Consider other Bp meds- Amlodipine if Bp elevated at nexty visit.

## 2013-11-12 NOTE — Assessment & Plan Note (Addendum)
Exact etiology not identified at this time. Pt has had several visits over the past 2 months, with some complainst of urinary frequency. Has had several courses of Bactrim and cipro for UTI.  UA shows- Bacteria and WBCs persistently, likely pt is not collecting these specimens correctly. She never has symptoms whenever she comes to clinic, she always goes to the ED. Ct abd W contrast- 10/2013- Without an etiology to explain pain, did not reveal diverticulitis. Urine culture- 11/05/2013- No growth. Pelvic Ultrasound- 11/12/2013- unrevealing.  Assessment- Likely constipation playing a role, also consider IBS- predom constipated. Pt also with complainst of frequency and difficulty with passing urine.  Plan-  - Docusate- 100mg  BID and Miralax 17g daily. - Screening colonoscopy- likley low yield, but pt is due and never had one done in the past - Pt was prescribed pyridium and dicyclomine, will cont. - Refferal to urology for bladder emptying study. - If work up negative consider Psych referral.

## 2013-11-12 NOTE — Assessment & Plan Note (Signed)
Exact etiology not identified at this time. Pt has had several visits over the past 2 months, with some complainst of urinary frequency. Has had several courses of Bactrim and cipro for UTI.  UA shows- Bacteria and WBCs persistently, likely pt is not collecting these specimens correctly. She never has symptoms whenever she comes to clinic, she always goes to the ED. Ct abd W contrast- 10/2013- Without an etiology to explain pain, did not reveal diverticulitis. Urine culture- 11/05/2013- No growth. Pelvic Ultrasound- 11/12/2013- unrevealing.  Assessment- Likely constipation playing a role, also consider IBS- predom constipated. Pt also with complainst of frequency and difficulty with passing urine.  Plan-  - Docusate- 100mg  BID and Miralax 17g daily. - Screening colonoscopy- likley low yield, but pt is due and never had one done in the past - Pt was prescribed pyridium and dicyclomine, will cont. - Refferal to urology for bladder emptying study.

## 2013-11-12 NOTE — Progress Notes (Signed)
Patient ID: [name], female   DOB: [birth year], 58 y.o.   MRN: [id number]   Subjective:   Patient ID: [name] female   DOB: [birth year] 58 y.o.   MRN: [id number]  HPI: [name] is a 58 y.o. with PMH listed below.   Presentd today for follow up of her abdominal pain, which she has had several Ed visit for over the past Month. Pt was in the Ed this morning for same complaints. Now she endorses mild abdominal pain, much relieved since she took tylenol. Pain- left lower abdomen, cannot exactly describe nature of pain, intermittent in severity. Improves with bowel movement and worse with constipation. Pt feels constipated today, last bowel movement was on Saturday- 3 days ago, it was soft but little quantity. Poor appetite with nausea but no Vomiting. No fever, no blood in stools, no weightloss. No dysuria, but endorse frequency- 3-4 times at night, also say stays in the bathroomm for a while at night before she is able to pass urine.  Pt denies any change in vaginal discharge, itching or abnormal smell, has not been sexually active in at least 5 years, and last period was at least 5 years ago.   Past Medical History  Diagnosis Date  . Diabetes mellitus type II, uncontrolled   . Hypertension   . Hyperlipidemia   . Ovarian cyst, left   . Anemia     due to menorrhagia, BL 8-10  . Vaginal cyst     nabothian and bartholin  . CKD (chronic kidney disease) stage 3, GFR 30-59 ml/min     baseline creatinine 1.4-1.7  . Anxiety   . Depression   . Postmenopausal bleeding 06/12/2008  . 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 UTI (lower urinary tract infection)   . IBS (irritable bowel syndrome)   . Chronic back pain   . Chronic abdominal pain   . DDD (degenerative disc disease), lumbar   . Bilateral renal cysts 01/15/2009    Qualifier: Diagnosis of  By: Tyrell Antonio MD, [place]     Current Outpatient Prescriptions  Medication Sig Dispense Refill  . aspirin EC 81  MG tablet Take 81 mg by mouth daily.      . ciprofloxacin (CIPRO) 500 MG tablet Take 1 tablet (500 mg total) by mouth 2 (two) times daily.  14 tablet  0  . dicyclomine (BENTYL) 20 MG tablet Take 20 mg by mouth 2 (two) times daily.      . insulin detemir (LEVEMIR) 100 UNIT/ML injection Inject 30 Units into the skin every evening.       . ondansetron (ZOFRAN) 4 MG tablet Take 1 tablet (4 mg total) by mouth every 6 (six) hours.  12 tablet  0  . phenazopyridine (PYRIDIUM) 200 MG tablet Take 1 tablet (200 mg total) by mouth 3 (three) times daily as needed for pain.  6 tablet  0  . pioglitazone (ACTOS) 15 MG tablet Take 15 mg by mouth daily.      . simvastatin (ZOCOR) 40 MG tablet Take 40 mg by mouth daily.      . [DISCONTINUED] pantoprazole (PROTONIX) 20 MG tablet Take 2 tablets (40 mg total) by mouth daily.  30 tablet  1  . [DISCONTINUED] sertraline (ZOLOFT) 100 MG tablet Take 1 tablet (100 mg total) by mouth daily.  30 tablet  2   No current facility-administered medications for this visit.   Family History  Problem Relation Age of Onset  .  Stroke Father   . Heart attack Father     Had MI in his 57s  . Stomach cancer Paternal Grandmother   . Diabetes Maternal Grandmother   . Cerebral palsy Daughter   . Anesthesia problems Neg Hx   . Hypotension Neg Hx   . Malignant hyperthermia Neg Hx   . Pseudochol deficiency Neg Hx    History   Social History  . Marital Status: Divorced    Spouse Name: N/A    Number of Children: 1  . Years of Education: 12th grade   Occupational History  . 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 Smoking status: Never Smoker   . Smokeless tobacco: Former Systems developer  . Alcohol Use: No  . Drug Use: No  . Sexual Activity: None   Other Topics Concern  . 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   Review of Systems: CONSTITUTIONAL- No Fever, weightloss, has some night sweat- most likely related to  menopausal state, poor appetite. SKIN- No Rash, colour changes or itching. HEAD- No Headache or dizziness, no seizures. EYES- No Vision loss, pain, redness, double or blurred vision. RESPIRATORY- No Cough or SOB. CARDIAC- No Palpitations, DOE, PND or chest pain. NEUROLOGIC- mild tingling/Numbness- soles bilat feet, no syncope, seizures or burning. [facility]- Depression or anxiety symptoms.  Objective:  Physical Exam: Filed Vitals:   11/12/13 1506  BP: 145/70  Pulse: 99  Temp: 98.2 F (36.8 C)  TempSrc: Oral  Height: 5\' 4"  (1.626 m)  Weight: 162 lb 12.8 oz (73.846 kg)  SpO2: 96%   GENERAL- alert, co-operative, appears as stated age, not in any distress. HEENT- Atraumatic, normocephalic, PERRL, EOMI, oral mucosa appears moist,, neck supple. CARDIAC- RRR, no murmurs, rubs or gallops. RESP- Moving equal volumes of air, and clear to auscultation bilaterally, no wheezes or crackles. ABDOMEN- Soft, nontender, no guarding or rebound, no palpable masses or organomegaly, bowel sounds present. BACK- Normal curvature of the spine, No tenderness along the vertebrae, no CVA tenderness. NEURO- No obvious Cr N abnormality, strenght upper and lower extremities- 5/5 intact, Gait- Normal. EXTREMITIES- pulse 2+, symmetric, no pedal edema. SKIN- Warm, dry, No rash or lesion. PSYCH- Flat affect, speech tangential, with high volume.  Assessment & Plan:   The patient's case and plan of care was discussed with attending physician, Dr. Dareen Piano.  Please see problem based charting for assessment and plan.

## 2013-11-12 NOTE — ED Notes (Signed)
Per pt, seen multiple times for abdominal pain.  States pain continues and that she is having tremors.

## 2013-11-12 NOTE — ED Provider Notes (Addendum)
CSN: [id number]     Arrival date & time 11/12/13  0740 History   First MD Initiated Contact with Patient 11/12/13 [id number]     Chief Complaint  Patient presents with  . Abdominal Pain   HPI Pt has been having recurrent episodes of abdominal pain.   She has been seen multiple times in the ED without a definitive diagnosis.  Pt has been referred to PCP and GI.  She does not have the time to make those appointments.  She comes to the ED bc she wants an answer.  She continues to have frequent urination.  Last night she was up in the bathroom all night long.  Pt feels that it is very hard for her to function.  She has not been eating well.  It does not hurt to press on her abdomen.  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  She does not feel like she can function and do what she has to do.  The pain is in the lower abdomen. On the left side and across the stomach.  No vomiting.  No diarrhea.  Last BM was Saturday.  She does feel constipated. Past Medical History  Diagnosis Date  . Diabetes mellitus type II, uncontrolled   . Hypertension   . Hyperlipidemia   . Ovarian cyst, left   . Anemia     due to menorrhagia, BL 8-10  . Vaginal cyst     nabothian and bartholin  . CKD (chronic kidney disease) stage 3, GFR 30-59 ml/min     baseline creatinine 1.4-1.7  . Anxiety   . Depression   . Postmenopausal bleeding 06/12/2008  . 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 UTI (lower urinary tract infection)   . IBS (irritable bowel syndrome)   . Chronic back pain   . Chronic abdominal pain   . DDD (degenerative disc disease), lumbar   . Bilateral renal cysts 01/15/2009    Qualifier: Diagnosis of  By: Tyrell Antonio MD, Jerald Kief     Past Surgical History  Procedure Laterality Date  . Cardiac surgery      to repair congenital defect as a child   Family History  Problem Relation Age of Onset  . Stroke Father   . Heart attack Father     Had MI in his  5s  . Stomach cancer Paternal Grandmother   . Diabetes Maternal Grandmother   . Cerebral palsy Daughter   . Anesthesia problems Neg Hx   . Hypotension Neg Hx   . Malignant hyperthermia Neg Hx   . Pseudochol deficiency Neg Hx    History  Substance Use Topics  . Smoking status: Never Smoker   . Smokeless tobacco: Former Systems developer  . Alcohol Use: No   OB History   Grav Para Term Preterm Abortions TAB SAB Ect Mult Living   3 1 0  2  2   1      Review of Systems  All other systems reviewed and are negative.     Allergies  Review of patient's allergies indicates no known allergies.  Home Medications   Prior to Admission medications   Medication Sig Start Date End Date Taking? Authorizing Provider  aspirin EC 81 MG tablet Take 81 mg by mouth daily.   Yes Historical Provider, MD  dicyclomine (BENTYL) 20 MG tablet Take 20 mg by mouth 2 (two) times daily.   Yes Historical Provider, MD  insulin detemir (LEVEMIR) 100 UNIT/ML injection Inject 30 Units into the skin every evening.    Yes Historical Provider, MD  pioglitazone (ACTOS) 15 MG tablet Take 15 mg by mouth daily.   Yes Historical Provider, MD  simvastatin (ZOCOR) 40 MG tablet Take 40 mg by mouth daily.   Yes Historical Provider, MD  ciprofloxacin (CIPRO) 500 MG tablet Take 1 tablet (500 mg total) by mouth 2 (two) times daily. 11/12/13   Dorie Rank, MD  ondansetron (ZOFRAN) 4 MG tablet Take 1 tablet (4 mg total) by mouth every 6 (six) hours. 11/09/13   Verl Dicker, PA-C  phenazopyridine (PYRIDIUM) 200 MG tablet Take 1 tablet (200 mg total) by mouth 3 (three) times daily as needed for pain. 11/12/13   Dorie Rank, MD   BP 137/67  Pulse 98  Temp(Src) 98.4 F (36.9 C) (Oral)  Resp 16  SpO2 97% Physical Exam  Nursing note and vitals reviewed. Constitutional: She appears well-developed and well-nourished.  HENT:  Head: Normocephalic and atraumatic.  Right Ear: External ear normal.  Left Ear: External ear normal.  Eyes:  Conjunctivae are normal. Right eye exhibits no discharge. Left eye exhibits no discharge. No scleral icterus.  Neck: Neck supple. No tracheal deviation present.  Cardiovascular: Normal rate, regular rhythm and intact distal pulses.   Pulmonary/Chest: Effort normal and breath sounds normal. No stridor. No respiratory distress. She has no wheezes. She has no rales.  Abdominal: Soft. Bowel sounds are normal. She exhibits no distension. There is no tenderness. There is no rebound and no guarding.  Musculoskeletal: She exhibits no edema and no tenderness.  Neurological: She is alert. She has normal strength. No cranial nerve deficit (no facial droop, extraocular movements intact, no slurred speech) or sensory deficit. She exhibits normal muscle tone. She displays no seizure activity. Coordination normal.  Skin: Skin is warm and dry. No rash noted. She is not diaphoretic.  Psychiatric: Her mood appears anxious. She exhibits a depressed mood.    ED Course  Procedures (including critical care time) Labs Review Labs Reviewed  CBC WITH DIFFERENTIAL - Abnormal; Notable for the following:    HCT 33.4 (*)    MCHC 36.2 (*)    All other components within normal limits  COMPREHENSIVE METABOLIC PANEL - Abnormal; Notable for the following:    Sodium 124 (*)    Potassium 3.5 (*)    Chloride 84 (*)    Glucose, Bld 120 (*)    BUN 27 (*)    Creatinine, Ser 1.95 (*)    GFR calc non Af Amer 28 (*)    GFR calc Af Amer 32 (*)    Anion gap 19 (*)    All other components within normal limits  URINALYSIS, ROUTINE W REFLEX MICROSCOPIC - Abnormal; Notable for the following:    APPearance CLOUDY (*)    Hgb urine dipstick TRACE (*)    Leukocytes, UA LARGE (*)    All other components within normal limits  URINE MICROSCOPIC-ADD ON - Abnormal; Notable for the following:    Squamous Epithelial / LPF FEW (*)    Bacteria, UA MANY (*)    All other components within normal limits  URINE CULTURE  LIPASE, BLOOD  NA  AND K (SODIUM & POTASSIUM), RAND UR    Imaging Review US Transvaginal Non-ob  11/12/2013   CLINICAL DATA:  Left lower abdominal/ pelvic pain  EXAM: TRANSABDOMINAL AND TRANSVAGINAL ULTRASOUND OF PELVIS  TECHNIQUE: Study was performed transabdominally to optimize pelvic field of  view evaluation and transvaginally to optimize internal visceral architecture evaluation.  COMPARISON:  CT abdomen and pelvis October 19, 2013  FINDINGS: Uterus  Measurements: 7.0 x 3.8 x 6.6 cm. No fibroids or other mass visualized.  Endometrium  Thickness: 2 mm.  No focal abnormality visualized.  Right ovary  Not seen by either transabdominal or transvaginal technique.  Left ovary  Measurements: 1.3 x 1.1 x 1.5 cm. Normal appearance/no adnexal mass.  Other findings  No free fluid.  IMPRESSION: Right ovary not visualize, probably due to postmenopausal state. Left ovary small but normal in appearance. No pelvic mass or fluid.   Electronically Signed   By: Lowella Grip M.D.   On: 11/12/2013 09:20   US Pelvis Complete  11/12/2013   CLINICAL DATA:  Left lower abdominal/ pelvic pain  EXAM: TRANSABDOMINAL AND TRANSVAGINAL ULTRASOUND OF PELVIS  TECHNIQUE: Study was performed transabdominally to optimize pelvic field of view evaluation and transvaginally to optimize internal visceral architecture evaluation.  COMPARISON:  CT abdomen and pelvis October 19, 2013  FINDINGS: Uterus  Measurements: 7.0 x 3.8 x 6.6 cm. No fibroids or other mass visualized.  Endometrium  Thickness: 2 mm.  No focal abnormality visualized.  Right ovary  Not seen by either transabdominal or transvaginal technique.  Left ovary  Measurements: 1.3 x 1.1 x 1.5 cm. Normal appearance/no adnexal mass.  Other findings  No free fluid.  IMPRESSION: Right ovary not visualize, probably due to postmenopausal state. Left ovary small but normal in appearance. No pelvic mass or fluid.   Electronically Signed   By: Lowella Grip M.D.   On: 11/12/2013 09:20   Dg Abd 2  Views  11/11/2013   CLINICAL DATA:  Intermittent right lower abdominal pain for several weeks. Associated nausea and decreased appetite.  EXAM: ABDOMEN - 2 VIEW  COMPARISON:  None.  FINDINGS: A fair amount of stool is seen in the colon. No small bowel dilatation. No unexpected radiopaque calculi. Visualized portion of the lower chest is unremarkable.  IMPRESSION: Bowel gas pattern is indicative of constipation.   Electronically Signed   By: Lorin Picket M.D.   On: 11/11/2013 10:21      MDM   Final diagnoses:  UTI (lower urinary tract infection)  Hyponatremia    Pt has been seen multiple times for this complaint.  Her renal insufficiency which had been worsening seems to have improved and back at baseline.  Sodium level has been continuously decreasing .  UA shows persistent uti.  Will give dose of rocephin.  Urine culture.  Wll consult with pcp.  Certainly a component of anxiety contributing to her symptoms.    Dorie Rank, MD 11/12/13 [phone]  Discussed with IM teaching service.  Pt will stop her HCTZ.  Pt can be seen in the office today at 3pm.  Will change her bactrim to cipro (pt requests this abx).  Recent culture showed no growth.  Culture sent today.    Dorie Rank, MD 11/12/13 1045

## 2013-11-12 NOTE — Patient Instructions (Addendum)
.   General Instructions:    We will be referring you to a Gastroenterologist for a Colonoscopy.   Also we will try to refer you to a Urologist for your bladder problem.  It is very likely that the pain you are having is from constipation.  We have added a new medication for your constipation, called Miralax, take this with Docusate.  We have stopped you Blood pressure medication- which was 2 medications in one, we have started only one of them which is Enalapril, which you will take once a day.   Please bring your medicines with you each time you come to clinic.  Medicines may include prescription medications, over-the-counter medications, herbal remedies, eye drops, vitamins, or other pills.

## 2013-11-13 ENCOUNTER — Emergency Department (HOSPITAL_COMMUNITY)
Admission: EM | Admit: 2013-11-13 | Discharge: 2013-11-13 | Disposition: A | Discharge status: Home / Self Care | Payer: No Typology Code available for payment source | Attending: Emergency Medicine | Admitting: Emergency Medicine

## 2013-11-13 ENCOUNTER — Other Ambulatory Visit: Payer: Self-pay

## 2013-11-13 ENCOUNTER — Emergency Department (HOSPITAL_COMMUNITY)
Admission: EM | Admit: 2013-11-13 | Discharge: 2013-11-14 | Disposition: A | Discharge status: Home / Self Care | Payer: No Typology Code available for payment source | Attending: Emergency Medicine | Admitting: Emergency Medicine

## 2013-11-13 ENCOUNTER — Encounter (HOSPITAL_COMMUNITY): Payer: Self-pay | Admitting: Emergency Medicine

## 2013-11-13 ENCOUNTER — Telehealth: Payer: Self-pay | Admitting: Internal Medicine

## 2013-11-13 ENCOUNTER — Encounter: Payer: No Typology Code available for payment source | Admitting: Dietician

## 2013-11-13 DIAGNOSIS — Z8742 Personal history of other diseases of the female genital tract: Secondary | ICD-10-CM | POA: Insufficient documentation

## 2013-11-13 DIAGNOSIS — N390 Urinary tract infection, site not specified: Secondary | ICD-10-CM | POA: Insufficient documentation

## 2013-11-13 DIAGNOSIS — Z7982 Long term (current) use of aspirin: Secondary | ICD-10-CM | POA: Insufficient documentation

## 2013-11-13 DIAGNOSIS — I129 Hypertensive chronic kidney disease with stage 1 through stage 4 chronic kidney disease, or unspecified chronic kidney disease: Secondary | ICD-10-CM | POA: Insufficient documentation

## 2013-11-13 DIAGNOSIS — Z792 Long term (current) use of antibiotics: Secondary | ICD-10-CM | POA: Insufficient documentation

## 2013-11-13 DIAGNOSIS — Q6102 Congenital multiple renal cysts: Secondary | ICD-10-CM | POA: Insufficient documentation

## 2013-11-13 DIAGNOSIS — Z8659 Personal history of other mental and behavioral disorders: Secondary | ICD-10-CM | POA: Insufficient documentation

## 2013-11-13 DIAGNOSIS — E11649 Type 2 diabetes mellitus with hypoglycemia without coma: Secondary | ICD-10-CM | POA: Insufficient documentation

## 2013-11-13 DIAGNOSIS — K5900 Constipation, unspecified: Secondary | ICD-10-CM | POA: Insufficient documentation

## 2013-11-13 DIAGNOSIS — G8929 Other chronic pain: Secondary | ICD-10-CM | POA: Insufficient documentation

## 2013-11-13 DIAGNOSIS — R109 Unspecified abdominal pain: Secondary | ICD-10-CM

## 2013-11-13 DIAGNOSIS — N183 Chronic kidney disease, stage 3 (moderate): Secondary | ICD-10-CM | POA: Insufficient documentation

## 2013-11-13 DIAGNOSIS — Z862 Personal history of diseases of the blood and blood-forming organs and certain disorders involving the immune mechanism: Secondary | ICD-10-CM | POA: Insufficient documentation

## 2013-11-13 DIAGNOSIS — M5136 Other intervertebral disc degeneration, lumbar region: Secondary | ICD-10-CM | POA: Insufficient documentation

## 2013-11-13 DIAGNOSIS — E119 Type 2 diabetes mellitus without complications: Secondary | ICD-10-CM | POA: Insufficient documentation

## 2013-11-13 DIAGNOSIS — Z794 Long term (current) use of insulin: Secondary | ICD-10-CM | POA: Insufficient documentation

## 2013-11-13 DIAGNOSIS — K589 Irritable bowel syndrome without diarrhea: Secondary | ICD-10-CM | POA: Insufficient documentation

## 2013-11-13 DIAGNOSIS — Q249 Congenital malformation of heart, unspecified: Secondary | ICD-10-CM | POA: Insufficient documentation

## 2013-11-13 DIAGNOSIS — E785 Hyperlipidemia, unspecified: Secondary | ICD-10-CM | POA: Insufficient documentation

## 2013-11-13 DIAGNOSIS — Z79899 Other long term (current) drug therapy: Secondary | ICD-10-CM | POA: Insufficient documentation

## 2013-11-13 DIAGNOSIS — E162 Hypoglycemia, unspecified: Secondary | ICD-10-CM

## 2013-11-13 DIAGNOSIS — Z8739 Personal history of other diseases of the musculoskeletal system and connective tissue: Secondary | ICD-10-CM | POA: Insufficient documentation

## 2013-11-13 LAB — CBC WITH DIFFERENTIAL/PLATELET
Basophils Absolute: 0 10*3/uL (ref 0.0–0.1)
Basophils Relative: 0 % (ref 0–1)
EOS ABS: 0 10*3/uL (ref 0.0–0.7)
Eosinophils Relative: 0 % (ref 0–5)
HCT: 33.9 % — ABNORMAL LOW (ref 36.0–46.0)
Hemoglobin: 12.2 g/dL (ref 12.0–15.0)
Lymphocytes Relative: 9 % — ABNORMAL LOW (ref 12–46)
Lymphs Abs: 0.8 10*3/uL (ref 0.7–4.0)
MCH: 30.1 pg (ref 26.0–34.0)
MCHC: 36 g/dL (ref 30.0–36.0)
MCV: 83.7 fL (ref 78.0–100.0)
Monocytes Absolute: 0.5 10*3/uL (ref 0.1–1.0)
Monocytes Relative: 6 % (ref 3–12)
NEUTROS PCT: 85 % — AB (ref 43–77)
Neutro Abs: 7.5 10*3/uL (ref 1.7–7.7)
PLATELETS: 216 10*3/uL (ref 150–400)
RBC: 4.05 MIL/uL (ref 3.87–5.11)
RDW: 11.7 % (ref 11.5–15.5)
WBC: 8.9 10*3/uL (ref 4.0–10.5)

## 2013-11-13 LAB — URINALYSIS, ROUTINE W REFLEX MICROSCOPIC
Bilirubin Urine: NEGATIVE
Glucose, UA: 100 mg/dL — AB
KETONES UR: NEGATIVE mg/dL
NITRITE: POSITIVE — AB
Protein, ur: NEGATIVE mg/dL
UROBILINOGEN UA: 1 mg/dL (ref 0.0–1.0)
pH: 5 (ref 5.0–8.0)

## 2013-11-13 LAB — I-STAT TROPONIN, ED: TROPONIN I, POC: 0.01 ng/mL (ref 0.00–0.08)

## 2013-11-13 LAB — COMPREHENSIVE METABOLIC PANEL
ALBUMIN: 4.1 g/dL (ref 3.5–5.2)
ALK PHOS: 77 U/L (ref 39–117)
ALT: 18 U/L (ref 0–35)
ANION GAP: 16 — AB (ref 5–15)
AST: 30 U/L (ref 0–37)
BUN: 27 mg/dL — ABNORMAL HIGH (ref 6–23)
CO2: 24 mEq/L (ref 19–32)
Calcium: 9.2 mg/dL (ref 8.4–10.5)
Chloride: 83 mEq/L — ABNORMAL LOW (ref 96–112)
Creatinine, Ser: 2.03 mg/dL — ABNORMAL HIGH (ref 0.50–1.10)
GFR calc Af Amer: 30 mL/min — ABNORMAL LOW (ref >90)
GFR calc non Af Amer: 26 mL/min — ABNORMAL LOW (ref >90)
Glucose, Bld: 78 mg/dL (ref 70–99)
POTASSIUM: 3.4 meq/L — AB (ref 3.7–5.3)
SODIUM: 123 meq/L — AB (ref 137–147)
TOTAL PROTEIN: 7.2 g/dL (ref 6.0–8.3)
Total Bilirubin: 0.3 mg/dL (ref 0.3–1.2)

## 2013-11-13 LAB — CBG MONITORING, ED: Glucose-Capillary: 90 mg/dL (ref 70–99)

## 2013-11-13 LAB — LIPASE, BLOOD: Lipase: 18 U/L (ref 11–59)

## 2013-11-13 LAB — URINE CULTURE

## 2013-11-13 LAB — URINE MICROSCOPIC-ADD ON

## 2013-11-13 NOTE — ED Notes (Signed)
Per EMS: Pt reports intermittent abdominal pain for several months. Pt was seen at [facility] yesterday, received fluids. Pain is in her LLQ. Pt has had little to no appetite. Ax4, NAD. Denies n/v/d.

## 2013-11-13 NOTE — Discharge Instructions (Signed)
Abdominal Pain, Women °Abdominal (stomach, pelvic, or belly) pain can be caused by many things. It is important to tell your doctor: °· The location of the pain. °· Does it come and go or is it present all the time? °· Are there things that start the pain (eating certain foods, exercise)? °· Are there other symptoms associated with the pain (fever, nausea, vomiting, diarrhea)? °All of this is helpful to know when trying to find the cause of the pain. °CAUSES  °· Stomach: virus or bacteria infection, or ulcer. °· Intestine: appendicitis (inflamed appendix), regional ileitis (Crohn's disease), ulcerative colitis (inflamed colon), irritable bowel syndrome, diverticulitis (inflamed diverticulum of the colon), or cancer of the stomach or intestine. °· Gallbladder disease or stones in the gallbladder. °· Kidney disease, kidney stones, or infection. °· Pancreas infection or cancer. °· Fibromyalgia (pain disorder). °· Diseases of the female organs: °¨ Uterus: fibroid (non-cancerous) tumors or infection. °¨ Fallopian tubes: infection or tubal pregnancy. °¨ Ovary: cysts or tumors. °¨ Pelvic adhesions (scar tissue). °¨ Endometriosis (uterus lining tissue growing in the pelvis and on the pelvic organs). °¨ Pelvic congestion syndrome (female organs filling up with blood just before the menstrual period). °¨ Pain with the menstrual period. °¨ Pain with ovulation (producing an egg). °¨ Pain with an IUD (intrauterine device, birth control) in the uterus. °¨ Cancer of the female organs. °· Functional pain (pain not caused by a disease, may improve without treatment). °· Psychological pain. °· Depression. °DIAGNOSIS  °Your doctor will decide the seriousness of your pain by doing an examination. °· Blood tests. °· X-rays. °· Ultrasound. °· CT scan (computed tomography, special type of X-ray). °· MRI (magnetic resonance imaging). °· Cultures, for infection. °· Barium enema (dye inserted in the large intestine, to better view it with  X-rays). °· Colonoscopy (looking in intestine with a lighted tube). °· Laparoscopy (minor surgery, looking in abdomen with a lighted tube). °· Major abdominal exploratory surgery (looking in abdomen with a large incision). °TREATMENT  °The treatment will depend on the cause of the pain.  °· Many cases can be observed and treated at home. °· Over-the-counter medicines recommended by your caregiver. °· Prescription medicine. °· Antibiotics, for infection. °· Birth control pills, for painful periods or for ovulation pain. °· Hormone treatment, for endometriosis. °· Nerve blocking injections. °· Physical therapy. °· Antidepressants. °· Counseling with a psychologist or psychiatrist. °· Minor or major surgery. °HOME CARE INSTRUCTIONS  °· Do not take laxatives, unless directed by your caregiver. °· Take over-the-counter pain medicine only if ordered by your caregiver. Do not take aspirin because it can cause an upset stomach or bleeding. °· Try a clear liquid diet (broth or water) as ordered by your caregiver. Slowly move to a bland diet, as tolerated, if the pain is related to the stomach or intestine. °· Have a thermometer and take your temperature several times a day, and record it. °· Bed rest and sleep, if it helps the pain. °· Avoid sexual intercourse, if it causes pain. °· Avoid stressful situations. °· Keep your follow-up appointments and tests, as your caregiver orders. °· If the pain does not go away with medicine or surgery, you may try: °¨ Acupuncture. °¨ Relaxation exercises (yoga, meditation). °¨ Group therapy. °¨ Counseling. °SEEK MEDICAL CARE IF:  °· You notice certain foods cause stomach pain. °· Your home care treatment is not helping your pain. °· You need stronger pain medicine. °· You want your IUD removed. °· You feel faint or   lightheaded.  You develop nausea and vomiting.  You develop a rash.  You are having side effects or an allergy to your medicine. SEEK IMMEDIATE MEDICAL CARE IF:   Your  pain does not go away or gets worse.  You have a fever.  Your pain is felt only in portions of the abdomen. The right side could possibly be appendicitis. The left lower portion of the abdomen could be colitis or diverticulitis.  You are passing blood in your stools (bright red or black tarry stools, with or without vomiting).  You have blood in your urine.  You develop chills, with or without a fever.  You pass out. MAKE SURE YOU:   Understand these instructions.  Will watch your condition.  Will get help right away if you are not doing well or get worse. Document Released: 11/21/2006 Document Revised: 06/10/2013 Document Reviewed: 12/11/2008 [facility] Patient Information 2015 [place], [place]. This information is not intended to replace advice given to you by your health care provider. Make sure you discuss any questions you have with your health care provider.  Hypoglycemia Hypoglycemia occurs when the glucose in your blood is too low. Glucose is a type of sugar that is your body's main energy source. Hormones, such as insulin and glucagon, control the level of glucose in the blood. Insulin lowers blood glucose and glucagon increases blood glucose. Having too much insulin in your blood stream, or not eating enough food containing sugar, can result in hypoglycemia. Hypoglycemia can happen to people with or without diabetes. It can develop quickly and can be a medical emergency.  CAUSES   Missing or delaying meals.  Not eating enough carbohydrates at meals.  Taking too much diabetes medicine.  Not timing your oral diabetes medicine or insulin doses with meals, snacks, and exercise.  Nausea and vomiting.  Certain medicines.  Severe illnesses, such as hepatitis, kidney disorders, and certain eating disorders.  Increased activity or exercise without eating something extra or adjusting medicines.  Drinking too much alcohol.  A nerve disorder that affects body functions like  your heart rate, blood pressure, and digestion (autonomic neuropathy).  A condition where the stomach muscles do not function properly (gastroparesis). Therefore, medicines and food may not absorb properly.  Rarely, a tumor of the pancreas can produce too much insulin. SYMPTOMS   Hunger.  Sweating (diaphoresis).  Change in body temperature.  Shakiness.  Headache.  Anxiety.  Lightheadedness.  Irritability.  Difficulty concentrating.  Dry mouth.  Tingling or numbness in the hands or feet.  Restless sleep or sleep disturbances.  Altered speech and coordination.  Change in mental status.  Seizures or prolonged convulsions.  Combativeness.  Drowsiness (lethargic).  Weakness.  Increased heart rate or palpitations.  Confusion.  Pale, gray skin color.  Blurred or double vision.  Fainting. DIAGNOSIS  A physical exam and medical history will be performed. Your caregiver may make a diagnosis based on your symptoms. Blood tests and other lab tests may be performed to confirm a diagnosis. Once the diagnosis is made, your caregiver will see if your signs and symptoms go away once your blood glucose is raised.  TREATMENT  Usually, you can easily treat your hypoglycemia when you notice symptoms.  Check your blood glucose. If it is less than 70 mg/dl, take one of the following:   3-4 glucose tablets.    cup juice.    cup regular soda.   1 cup skim milk.   -1 tube of glucose gel.   5-6  hard candies.   Avoid high-fat drinks or food that may delay a rise in blood glucose levels.  Do not take more than the recommended amount of sugary foods, drinks, gel, or tablets. Doing so will cause your blood glucose to go too high.   Wait 10-15 minutes and recheck your blood glucose. If it is still less than 70 mg/dl or below your target range, repeat treatment.   Eat a snack if it is more than 1 hour until your next meal.  There may be a time when your blood  glucose may go so low that you are unable to treat yourself at home when you start to notice symptoms. You may need someone to help you. You may even faint or be unable to swallow. If you cannot treat yourself, someone will need to bring you to the hospital.  [place]  If you have diabetes, follow your diabetes management plan by:  Taking your medicines as directed.  Following your exercise plan.  Following your meal plan. Do not skip meals. Eat on time.  Testing your blood glucose regularly. Check your blood glucose before and after exercise. If you exercise longer or different than usual, be sure to check blood glucose more frequently.  Wearing your medical alert jewelry that says you have diabetes.  Identify the cause of your hypoglycemia. Then, develop ways to prevent the recurrence of hypoglycemia.  Do not take a hot bath or shower right after an insulin shot.  Always carry treatment with you. Glucose tablets are the easiest to carry.  If you are going to drink alcohol, drink it only with meals.  Tell friends or family members ways to keep you safe during a seizure. This may include removing hard or sharp objects from the area or turning you on your side.  Maintain a healthy weight. SEEK MEDICAL CARE IF:   You are having problems keeping your blood glucose in your target range.  You are having frequent episodes of hypoglycemia.  You feel you might be having side effects from your medicines.  You are not sure why your blood glucose is dropping so low.  You notice a change in vision or a new problem with your vision. SEEK IMMEDIATE MEDICAL CARE IF:   Confusion develops.  A change in mental status occurs.  The inability to swallow develops.  Fainting occurs. Document Released: 01/24/2005 Document Revised: 01/29/2013 Document Reviewed: 05/23/2011 [facility] Patient Information 2015 [place], [place]. This information is not intended to replace advice given to  you by your health care provider. Make sure you discuss any questions you have with your health care provider.  Urinary Tract Infection Urinary tract infections (UTIs) can develop anywhere along your urinary tract. Your urinary tract is your body's drainage system for removing wastes and extra water. Your urinary tract includes two kidneys, two ureters, a bladder, and a urethra. Your kidneys are a pair of bean-shaped organs. Each kidney is about the size of your fist. They are located below your ribs, one on each side of your spine. CAUSES Infections are caused by microbes, which are microscopic organisms, including fungi, viruses, and bacteria. These organisms are so small that they can only be seen through a microscope. Bacteria are the microbes that most commonly cause UTIs. SYMPTOMS  Symptoms of UTIs may vary by age and gender of the patient and by the location of the infection. Symptoms in young women typically include a frequent and intense urge to urinate and a painful, burning  feeling in the bladder or urethra during urination. Older women and men are more likely to be tired, shaky, and weak and have muscle aches and abdominal pain. A fever may mean the infection is in your kidneys. Other symptoms of a kidney infection include pain in your back or sides below the ribs, nausea, and vomiting. DIAGNOSIS To diagnose a UTI, your caregiver will ask you about your symptoms. Your caregiver also will ask to provide a urine sample. The urine sample will be tested for bacteria and white blood cells. White blood cells are made by your body to help fight infection. TREATMENT  Typically, UTIs can be treated with medication. Because most UTIs are caused by a bacterial infection, they usually can be treated with the use of antibiotics. The choice of antibiotic and length of treatment depend on your symptoms and the type of bacteria causing your infection. HOME CARE INSTRUCTIONS  If you were prescribed  antibiotics, take them exactly as your caregiver instructs you. Finish the medication even if you feel better after you have only taken some of the medication.  Drink enough water and fluids to keep your urine clear or pale yellow.  Avoid caffeine, tea, and carbonated beverages. They tend to irritate your bladder.  Empty your bladder often. Avoid holding urine for long periods of time.  Empty your bladder before and after sexual intercourse.  After a bowel movement, women should cleanse from front to back. Use each tissue only once. SEEK MEDICAL CARE IF:   You have back pain.  You develop a fever.  Your symptoms do not begin to resolve within 3 days. SEEK IMMEDIATE MEDICAL CARE IF:   You have severe back pain or lower abdominal pain.  You develop chills.  You have nausea or vomiting.  You have continued burning or discomfort with urination. MAKE SURE YOU:   Understand these instructions.  Will watch your condition.  Will get help right away if you are not doing well or get worse. Document Released: 11/03/2004 Document Revised: 07/26/2011 Document Reviewed: 03/04/2011 [facility] Patient Information 2015 [place], [place]. This information is not intended to replace advice given to you by your health care provider. Make sure you discuss any questions you have with your health care provider.

## 2013-11-13 NOTE — Progress Notes (Signed)
INTERNAL MEDICINE TEACHING ATTENDING ADDENDUM - Shynice Sigel, MD: I reviewed and discussed at the time of visit with the resident Dr. Emokpae, the patient's medical history, physical examination, diagnosis and results of pertinent tests and treatment and I agree with the patient's care as documented.  

## 2013-11-13 NOTE — Telephone Encounter (Signed)
Received a page from Ms. Ellithorpe, she complains that her blood sugar has been low recently in the 70s in the past week for 1-2 days with diaphoresis but this improved after she ate half of a pop tart.    She is taking 30 units of Levemir at bedtime and recently started Actos 15mg  daily.   She could not go to her appointment with Debera Lat today but will reschedule it.   She was buying more strips for her meter.   The pt was instructed to lower her Levemir insulin dose to 26 units qHS and to check her BS 3 times daily before meals and at bedtime and whenever she felt sweaty. She was also instructed to follow up with the [facility] within one week.   She verbalized understanding, instructions were confirmed by back teaching method.   Blain Pais, MD PGY-3 IMTS 5:46 PM

## 2013-11-13 NOTE — Telephone Encounter (Signed)
Received another page from Ms. Egnor. She wanted to know if her bladder had been checked during her recent CT scan because she still feels a warm feeling in her lower abdomen. She stated that the discomfort improves with Tylenol PRN and she will continue taking it as needed.  She stated that she was feeling better though and was on her way to eat out with her daughter.  She has followed up with the [facility] next week and will further discuss her symptoms.   She denies severe pain or distress at that time and was instructed to follow up with the [facility] as planned.  She verbalized understanding and agreed with this plan.

## 2013-11-13 NOTE — ED Notes (Signed)
Pt reports she has been taking acetaminophen for her pain, this usually is "effective in taking the edge off her pain".

## 2013-11-13 NOTE — ED Provider Notes (Signed)
TIME SEEN: 5:00 AM  CHIEF COMPLAINT: Left lower quadrant pain, episode of diaphoresis and low blood sugar  HPI: Patient is a 58 y.o. F with history of insulin-dependent diabetes, hypertension, hyperlipidemia, chronic kidney disease, chronic abdominal and back pain, history of bilateral renal cysts who presents to the emergency department with complaints of episode of diaphoresis that woke her from sleep. She reports that she checked her blood sugar and it was 17. She reports her blood sugars are normally in the 300s. She thinks this is the cause of her diaphoresis. She states it made her very nervous and she came to the hospital. She admits that she is "a very anxious person". She denies any chest pain or shortness of breath. No fevers or chills. No nausea, vomiting or diarrhea. She has a history of chronic constipation. She is also complaining of left lower quadrant pain that she has had for 3 months. She had a negative CT of her abdomen and pelvis on 10/19/13 and had a normal transvaginal ultrasound 11/12/13. She saw family medicine yesterday in clinic. They are working on GI and urology followup per patient's report.  ROS: See HPI Constitutional: no fever  Eyes: no drainage  ENT: no runny nose   Cardiovascular:  no chest pain  Resp: no SOB  GI: no vomiting GU: no dysuria Integumentary: no rash  Allergy: no hives  Musculoskeletal: no leg swelling  Neurological: no slurred speech ROS otherwise negative  PAST MEDICAL HISTORY/PAST SURGICAL HISTORY:  Past Medical History  Diagnosis Date  . Diabetes mellitus type II, uncontrolled   . Hypertension   . Hyperlipidemia   . Ovarian cyst, left   . Anemia     due to menorrhagia, BL 8-10  . Vaginal cyst     nabothian and bartholin  . CKD (chronic kidney disease) stage 3, GFR 30-59 ml/min     baseline creatinine 1.4-1.7  . Anxiety   . Depression   . Postmenopausal bleeding 06/12/2008  . Congenital heart defect     surgically corrected as a child   . UTI (lower urinary tract infection)   . IBS (irritable bowel syndrome)   . Chronic back pain   . Chronic abdominal pain   . DDD (degenerative disc disease), lumbar   . Bilateral renal cysts 01/15/2009    Qualifier: Diagnosis of  By: Tyrell Antonio MD, [place]      MEDICATIONS:  Prior to Admission medications   Medication Sig Start Date End Date Taking? Authorizing Provider  aspirin EC 81 MG tablet Take 81 mg by mouth daily.    Historical Provider, MD  ciprofloxacin (CIPRO) 500 MG tablet Take 1 tablet (500 mg total) by mouth 2 (two) times daily. 11/12/13   Dorie Rank, MD  dicyclomine (BENTYL) 20 MG tablet Take 20 mg by mouth 2 (two) times daily.    Historical Provider, MD  docusate sodium (COLACE) 100 MG capsule Take 1 capsule (100 mg total) by mouth 2 (two) times daily. 11/12/13   Ejiroghene Arlyce Dice, MD  enalapril (VASOTEC) 10 MG tablet Take 1 tablet (10 mg total) by mouth daily. 11/12/13   Ejiroghene Arlyce Dice, MD  insulin detemir (LEVEMIR) 100 UNIT/ML injection Inject 30 Units into the skin every evening.     Historical Provider, MD  ondansetron (ZOFRAN) 4 MG tablet Take 1 tablet (4 mg total) by mouth every 6 (six) hours. 11/09/13   Verl Dicker, PA-C  phenazopyridine (PYRIDIUM) 200 MG tablet Take 1 tablet (200 mg total) by mouth 3 (three)  times daily as needed for pain. 11/12/13   Dorie Rank, MD  pioglitazone (ACTOS) 15 MG tablet Take 15 mg by mouth daily.    Historical Provider, MD  polyethylene glycol (MIRALAX / GLYCOLAX) packet Take 17 g by mouth daily. 11/12/13   Ejiroghene Arlyce Dice, MD  simvastatin (ZOCOR) 40 MG tablet Take 40 mg by mouth daily.    Historical Provider, MD    ALLERGIES:  No Known Allergies  SOCIAL HISTORY:  History  Substance Use Topics  . Smoking status: Never Smoker   . Smokeless tobacco: Former Systems developer  . Alcohol Use: No    FAMILY HISTORY: Family History  Problem Relation Age of Onset  . Stroke Father   . Heart attack Father     Had MI in his 58s  .  Stomach cancer Paternal Grandmother   . Diabetes Maternal Grandmother   . Cerebral palsy Daughter   . Anesthesia problems Neg Hx   . Hypotension Neg Hx   . Malignant hyperthermia Neg Hx   . Pseudochol deficiency Neg Hx     EXAM: BP 139/61  Pulse 95  Temp(Src) 97.4 F (36.3 C) (Oral)  Resp 20  Wt 162 lb (73.483 kg)  SpO2 100% CONSTITUTIONAL: Alert and oriented and responds appropriately to questions. Well-appearing; well-nourished, pleasant, appears anxious but in no distress, nontoxic HEAD: Normocephalic EYES: Conjunctivae clear, PERRL ENT: normal nose; no rhinorrhea; moist mucous membranes; pharynx without lesions noted NECK: Supple, no meningismus, no LAD  CARD: RRR; S1 and S2 appreciated; no murmurs, no clicks, no rubs, no gallops RESP: Normal chest excursion without splinting or tachypnea; breath sounds clear and equal bilaterally; no wheezes, no rhonchi, no rales,  ABD/GI: Normal bowel sounds; non-distended; soft, non-tender, no rebound, no guarding, abdomen is completely soft and nontender to palpation, no peritoneal signs BACK:  The back appears normal and is non-tender to palpation, there is no CVA tenderness EXT: Normal ROM in all joints; non-tender to palpation; no edema; normal capillary refill; no cyanosis    SKIN: Normal color for age and race; warm NEURO: Moves all extremities equally PSYCH: Patient appears very anxious, has rapid speech. Grooming and personal hygiene are appropriate.  MEDICAL DECISION MAKING: Patient here with an episode of relative hypoglycemia. Glucose in the ED is 90. She states that she is feeling much better. She is complaining of her chronic left lower quadrant pain. She's been in the emergency department several times for the same with negative workup. She has outpatient PCP followup in the air attempting to get her into gastroenterology and urology. I do not feel there is any life-threatening illness today. Her labs are unremarkable other than a  creatinine which is chronic for her. We'll obtain urinalysis that she was recently diagnosed with a UTI. Anticipate if workup is negative, patient will be discharged home.  ED PROGRESS: Patient's urinalysis shows a nitrite positive urinary tract infection. Culture has been sent. Her prior cultures have showed insignificant growth or multiple flora. She reports Cipro as normally help her in the past that she was started on yesterday. She was also given a dose of ceftriaxone in the ED. I feel she is safe to be discharged home with outpatient followup. She is comfortable with this plan. Have strongly advised her to followup with her primary care physician and discuss with her and attempted to reassure her that I do not feel there is any life-threatening illness and that the role of the emergency department [place] to rule out emergencies and that  she needs outpatient followup and may need to see a specialist. Patient verbalizes understanding and is comfortable with plan.     [place], DO 11/13/13 [phone]

## 2013-11-13 NOTE — ED Notes (Signed)
Pt reports lower abdominal pain ongoing for 1 week. Pt being tx for UTI but sts she has never had UTI that hurt this bad. C/o nv as well.

## 2013-11-14 ENCOUNTER — Ambulatory Visit (INDEPENDENT_AMBULATORY_CARE_PROVIDER_SITE_OTHER): Payer: No Typology Code available for payment source | Admitting: Internal Medicine

## 2013-11-14 ENCOUNTER — Emergency Department (HOSPITAL_COMMUNITY)
Admission: EM | Admit: 2013-11-14 | Discharge: 2013-11-14 | Disposition: A | Discharge status: Home / Self Care | Payer: No Typology Code available for payment source | Attending: Emergency Medicine | Admitting: Emergency Medicine

## 2013-11-14 ENCOUNTER — Encounter: Payer: Self-pay | Admitting: Internal Medicine

## 2013-11-14 VITALS — BP 143/73 | HR 104 | Temp 98.3°F | Wt 161.3 lb

## 2013-11-14 DIAGNOSIS — E119 Type 2 diabetes mellitus without complications: Secondary | ICD-10-CM | POA: Insufficient documentation

## 2013-11-14 DIAGNOSIS — Z7982 Long term (current) use of aspirin: Secondary | ICD-10-CM | POA: Insufficient documentation

## 2013-11-14 DIAGNOSIS — Z862 Personal history of diseases of the blood and blood-forming organs and certain disorders involving the immune mechanism: Secondary | ICD-10-CM | POA: Insufficient documentation

## 2013-11-14 DIAGNOSIS — N183 Chronic kidney disease, stage 3 (moderate): Secondary | ICD-10-CM | POA: Insufficient documentation

## 2013-11-14 DIAGNOSIS — Z79899 Other long term (current) drug therapy: Secondary | ICD-10-CM | POA: Insufficient documentation

## 2013-11-14 DIAGNOSIS — N390 Urinary tract infection, site not specified: Secondary | ICD-10-CM | POA: Insufficient documentation

## 2013-11-14 DIAGNOSIS — Z794 Long term (current) use of insulin: Secondary | ICD-10-CM | POA: Insufficient documentation

## 2013-11-14 DIAGNOSIS — I129 Hypertensive chronic kidney disease with stage 1 through stage 4 chronic kidney disease, or unspecified chronic kidney disease: Secondary | ICD-10-CM | POA: Insufficient documentation

## 2013-11-14 DIAGNOSIS — Q6102 Congenital multiple renal cysts: Secondary | ICD-10-CM | POA: Insufficient documentation

## 2013-11-14 DIAGNOSIS — R1030 Lower abdominal pain, unspecified: Secondary | ICD-10-CM

## 2013-11-14 DIAGNOSIS — E785 Hyperlipidemia, unspecified: Secondary | ICD-10-CM | POA: Insufficient documentation

## 2013-11-14 DIAGNOSIS — Z792 Long term (current) use of antibiotics: Secondary | ICD-10-CM | POA: Insufficient documentation

## 2013-11-14 DIAGNOSIS — Z8774 Personal history of (corrected) congenital malformations of heart and circulatory system: Secondary | ICD-10-CM | POA: Insufficient documentation

## 2013-11-14 DIAGNOSIS — G8929 Other chronic pain: Secondary | ICD-10-CM | POA: Insufficient documentation

## 2013-11-14 DIAGNOSIS — E871 Hypo-osmolality and hyponatremia: Secondary | ICD-10-CM

## 2013-11-14 DIAGNOSIS — R1032 Left lower quadrant pain: Secondary | ICD-10-CM

## 2013-11-14 DIAGNOSIS — Z8659 Personal history of other mental and behavioral disorders: Secondary | ICD-10-CM | POA: Insufficient documentation

## 2013-11-14 DIAGNOSIS — Z8719 Personal history of other diseases of the digestive system: Secondary | ICD-10-CM | POA: Insufficient documentation

## 2013-11-14 DIAGNOSIS — M5136 Other intervertebral disc degeneration, lumbar region: Secondary | ICD-10-CM | POA: Insufficient documentation

## 2013-11-14 LAB — URINE MICROSCOPIC-ADD ON

## 2013-11-14 LAB — COMPREHENSIVE METABOLIC PANEL
ALT: 18 U/L (ref 0–35)
AST: 24 U/L (ref 0–37)
Albumin: 4 g/dL (ref 3.5–5.2)
Alkaline Phosphatase: 79 U/L (ref 39–117)
Anion gap: 16 — ABNORMAL HIGH (ref 5–15)
BUN: 22 mg/dL (ref 6–23)
CALCIUM: 9.2 mg/dL (ref 8.4–10.5)
CO2: 24 mEq/L (ref 19–32)
CREATININE: 2.01 mg/dL — AB (ref 0.50–1.10)
Chloride: 83 mEq/L — ABNORMAL LOW (ref 96–112)
GFR calc Af Amer: 31 mL/min — ABNORMAL LOW (ref >90)
GFR calc non Af Amer: 27 mL/min — ABNORMAL LOW (ref >90)
Glucose, Bld: 271 mg/dL — ABNORMAL HIGH (ref 70–99)
Potassium: 4.1 mEq/L (ref 3.7–5.3)
Sodium: 123 mEq/L — ABNORMAL LOW (ref 137–147)
Total Bilirubin: 0.3 mg/dL (ref 0.3–1.2)
Total Protein: 7.1 g/dL (ref 6.0–8.3)

## 2013-11-14 LAB — POCT URINALYSIS DIPSTICK
BILIRUBIN UA: NEGATIVE
Glucose, UA: 100
Ketones, UA: 15
Nitrite, UA: POSITIVE
PH UA: 5
Protein, UA: NEGATIVE
Spec Grav, UA: <=1.005
UROBILINOGEN UA: 0.2

## 2013-11-14 LAB — URINE CULTURE

## 2013-11-14 LAB — CBC
HCT: 34.1 % — ABNORMAL LOW (ref 36.0–46.0)
Hemoglobin: 12.2 g/dL (ref 12.0–15.0)
MCH: 30.3 pg (ref 26.0–34.0)
MCHC: 35.8 g/dL (ref 30.0–36.0)
MCV: 84.6 fL (ref 78.0–100.0)
PLATELETS: 189 10*3/uL (ref 150–400)
RBC: 4.03 MIL/uL (ref 3.87–5.11)
RDW: 11.7 % (ref 11.5–15.5)
WBC: 4.7 10*3/uL (ref 4.0–10.5)

## 2013-11-14 LAB — URINALYSIS, ROUTINE W REFLEX MICROSCOPIC
Glucose, UA: 100 mg/dL — AB
Hgb urine dipstick: NEGATIVE
Ketones, ur: NEGATIVE mg/dL
Nitrite: POSITIVE — AB
PROTEIN: NEGATIVE mg/dL
Specific Gravity, Urine: 1.009 (ref 1.005–1.030)
UROBILINOGEN UA: 1 mg/dL (ref 0.0–1.0)
pH: 5 (ref 5.0–8.0)

## 2013-11-14 MED ORDER — PHENAZOPYRIDINE HCL 200 MG PO TABS: 200.0000 mg | ORAL_TABLET | Freq: Three times a day (TID) | ORAL | Status: DC

## 2013-11-14 MED ORDER — SODIUM CHLORIDE 0.9 % IV BOLUS (SEPSIS)
1000.0000 mL | Freq: Once | INTRAVENOUS | Status: AC
  Administered 2013-11-14: 1000 mL via INTRAVENOUS

## 2013-11-14 MED ORDER — PHENAZOPYRIDINE HCL 100 MG PO TABS
95.0000 mg | ORAL_TABLET | Freq: Once | ORAL | Status: AC
  Administered 2013-11-14: 100 mg via ORAL
  Filled 2013-11-14: qty 1

## 2013-11-14 MED ORDER — ACETAMINOPHEN 500 MG PO TABS
1000.0000 mg | ORAL_TABLET | Freq: Once | ORAL | Status: AC
  Administered 2013-11-14: 1000 mg via ORAL
  Filled 2013-11-14: qty 2

## 2013-11-14 MED ORDER — TRAMADOL HCL 50 MG PO TABS: 50.0000 mg | ORAL_TABLET | Freq: Four times a day (QID) | ORAL | Status: DC | PRN

## 2013-11-14 MED ORDER — FAMOTIDINE 40 MG PO TABS: 40.0000 mg | ORAL_TABLET | Freq: Two times a day (BID) | ORAL | Status: DC

## 2013-11-14 MED ORDER — MORPHINE SULFATE 4 MG/ML IJ SOLN: 4.0000 mg | Freq: Once | INTRAMUSCULAR | Status: DC

## 2013-11-14 NOTE — Progress Notes (Signed)
Subjective:   Patient ID: [name] female   DOB: 01-23-1956 58 y.o.   MRN: [id number]  HPI: [name] is a 58 y.o. woman pmh as listed below presents for ongoing lower abdominal pain.   The patient was recently seen this a.m. in the ED and had ultrasound and labs done that showed her chronic hyponatremia that is stable and a little AKI. The patient was given some fluids and Tylenol where she then call the clinic for an appointment to be seen today. The patient is also undergoing UTI treatment with Cipro. He is having no symptoms at all she denied any urinary frequency, urgency, pyuria or dysuria. She denied any blood in her urine. She has had some ongoing anorexia that she relates to higher levels of stress lately since she has a special needs child and little social support. In terms of her, pain she's not had any significant weight loss or weight gain, no change in her bowel habits, some constipation that is relieved with increasing fluids, only some trace blood stained stool after periods of constipation but no gross blood per rectum or hematochezia/dark tarry stools. She has not had any episodes of vomiting but complains of some heartburn and globus sensation in her throat. She's not taking any over-the-counter antacids. She does state that one of the ED physicians gave her Bentyl which has helped with some of her bowel crampy pain. She's not had any fevers or chills, no sick contacts,no new sexual partners.    Past Medical History  Diagnosis Date  . Diabetes mellitus type II, uncontrolled   . Hypertension   . Hyperlipidemia   . Ovarian cyst, left   . Anemia     due to menorrhagia, BL 8-10  . Vaginal cyst     nabothian and bartholin  . CKD (chronic kidney disease) stage 3, GFR 30-59 ml/min     baseline creatinine 1.4-1.7  . Anxiety   . Depression   . Postmenopausal bleeding 06/12/2008  . 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 UTI (lower urinary tract  infection)   . IBS (irritable bowel syndrome)   . Chronic back pain   . Chronic abdominal pain   . DDD (degenerative disc disease), lumbar   . Bilateral renal cysts 01/15/2009    Qualifier: Diagnosis of  By: Tyrell Antonio MD, [place]     Current Outpatient Prescriptions  Medication Sig Dispense Refill  . acetaminophen (TYLENOL) 325 MG tablet Take 650 mg by mouth every 6 (six) hours as needed for moderate pain.      Marland Kitchen aspirin EC 81 MG tablet Take 81 mg by mouth daily.      . ciprofloxacin (CIPRO) 500 MG tablet Take 500 mg by mouth 2 (two) times daily.      Marland Kitchen dicyclomine (BENTYL) 20 MG tablet Take 20 mg by mouth 2 (two) times daily.      . diphenhydramine-acetaminophen (TYLENOL PM) 25-500 MG TABS Take 2 tablets by mouth at bedtime as needed.      . docusate sodium (COLACE) 100 MG capsule Take 100 mg by mouth 2 (two) times daily as needed for mild constipation.      . enalapril (VASOTEC) 10 MG tablet Take 10 mg by mouth daily.      . Insulin Detemir (LEVEMIR FLEXTOUCH) 100 UNIT/ML Pen Inject 30 Units into the skin every evening.       . phenazopyridine (PYRIDIUM) 200 MG tablet Take 1 tablet (200 mg  total) by mouth 3 (three) times daily.  12 tablet  0  . pioglitazone (ACTOS) 15 MG tablet Take 15 mg by mouth daily.      . simvastatin (ZOCOR) 40 MG tablet Take 40 mg by mouth daily.      . traMADol (ULTRAM) 50 MG tablet Take 1 tablet (50 mg total) by mouth every 6 (six) hours as needed.  15 tablet  0  . [DISCONTINUED] pantoprazole (PROTONIX) 20 MG tablet Take 2 tablets (40 mg total) by mouth daily.  30 tablet  1  . [DISCONTINUED] sertraline (ZOLOFT) 100 MG tablet Take 1 tablet (100 mg total) by mouth daily.  30 tablet  2   No current facility-administered medications for this visit.   Family History  Problem Relation Age of Onset  . Stroke Father   . Heart attack Father     Had MI in his 20s  . Stomach cancer Paternal Grandmother   . Diabetes Maternal Grandmother   . Cerebral palsy Daughter   .  Anesthesia problems Neg Hx   . Hypotension Neg Hx   . Malignant hyperthermia Neg Hx   . Pseudochol deficiency Neg Hx    History   Social History  . Marital Status: Divorced    Spouse Name: N/A    Number of Children: 1  . Years of Education: 12th grade   Occupational History  . 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 Smoking status: Never Smoker   . Smokeless tobacco: Former Systems developer  . Alcohol Use: No  . Drug Use: No  . Sexual Activity: None   Other Topics Concern  . 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   Review of Systems: Pertinent items are noted in HPI. Objective:  Physical Exam: Filed Vitals:   11/14/13 1455  BP: 143/73  Pulse: 104  Temp: 98.3 F (36.8 C)  TempSrc: Oral  Weight: 161 lb 4.8 oz (73.165 kg)  SpO2: 99%   General: sitting in chair, NAD at times teary and crying HEENT: PERRL, EOMI, no scleral icterus Cardiac: RRR, no rubs, murmurs or gallops Pulm: clear to auscultation bilaterally, moving normal volumes of air Abd: soft,ttp over lower right and left quadrants slightly better with distraction, no rebound, nondistended, BS present Ext: warm and well perfused, no pedal edema Neuro: alert and oriented X3, cranial nerves II-XII grossly intact Psych: very anxious and tearful  Assessment & Plan:  Please see problem oriented charting  Pt discussed with Dr. Dareen Piano

## 2013-11-14 NOTE — ED Provider Notes (Signed)
CSN: [id number]     Arrival date & time 11/14/13  0813 History   First MD Initiated Contact with Patient 11/14/13 [phone]     Chief Complaint  Patient presents with  . Abdominal Pain     (Consider location/radiation/quality/duration/timing/severity/associated sxs/prior Treatment) Patient is a 58 y.o. female presenting with abdominal pain. The history is provided by the patient.  Abdominal Pain Pain location:  LLQ Pain quality: aching and burning   Pain radiates to:  Does not radiate Pain severity:  Moderate Onset quality:  Sudden Timing:  Constant Progression:  Worsening Chronicity:  Recurrent Relieved by:  Nothing Worsened by:  Nothing tried Associated symptoms: no cough, no diarrhea, no fever, no shortness of breath and no vomiting     Past Medical History  Diagnosis Date  . Diabetes mellitus type II, uncontrolled   . Hypertension   . Hyperlipidemia   . Ovarian cyst, left   . Anemia     due to menorrhagia, BL 8-10  . Vaginal cyst     nabothian and bartholin  . CKD (chronic kidney disease) stage 3, GFR 30-59 ml/min     baseline creatinine 1.4-1.7  . Anxiety   . Depression   . Postmenopausal bleeding 06/12/2008  . 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 UTI (lower urinary tract infection)   . IBS (irritable bowel syndrome)   . Chronic back pain   . Chronic abdominal pain   . DDD (degenerative disc disease), lumbar   . Bilateral renal cysts 01/15/2009    Qualifier: Diagnosis of  By: Tyrell Antonio MD, Jerald Kief     Past Surgical History  Procedure Laterality Date  . Cardiac surgery      to repair congenital defect as a child   Family History  Problem Relation Age of Onset  . Stroke Father   . Heart attack Father     Had MI in his 30s  . Stomach cancer Paternal Grandmother   . Diabetes Maternal Grandmother   . Cerebral palsy Daughter   . Anesthesia problems Neg Hx   . Hypotension Neg Hx   . Malignant hyperthermia Neg Hx   . Pseudochol deficiency  Neg Hx    History  Substance Use Topics  . Smoking status: Never Smoker   . Smokeless tobacco: Former Systems developer  . Alcohol Use: No   OB History   Grav Para Term Preterm Abortions TAB SAB Ect Mult Living   3 1 0  2  2   1      Review of Systems  Constitutional: Negative for fever.  Respiratory: Negative for cough and shortness of breath.   Gastrointestinal: Positive for abdominal pain. Negative for vomiting and diarrhea.  All other systems reviewed and are negative.     Allergies  Review of patient's allergies indicates no known allergies.  Home Medications   Prior to Admission medications   Medication Sig Start Date End Date Taking? Authorizing Provider  aspirin EC 81 MG tablet Take 81 mg by mouth daily.    Historical Provider, MD  ciprofloxacin (CIPRO) 500 MG tablet Take 500 mg by mouth 2 (two) times daily.    Historical Provider, MD  dicyclomine (BENTYL) 20 MG tablet Take 20 mg by mouth 2 (two) times daily.    Historical Provider, MD  docusate sodium (COLACE) 100 MG capsule Take 100 mg by mouth 2 (two) times daily as needed for mild constipation.    Historical Provider, MD  enalapril (VASOTEC) 10 MG  tablet Take 10 mg by mouth daily.    Historical Provider, MD  insulin detemir (LEVEMIR) 100 UNIT/ML injection Inject 30 Units into the skin every evening.     Historical Provider, MD  phenazopyridine (PYRIDIUM) 200 MG tablet Take 1 tablet (200 mg total) by mouth 3 (three) times daily. 11/14/13   Kaitlyn Szekalski, PA-C  pioglitazone (ACTOS) 15 MG tablet Take 15 mg by mouth daily.    Historical Provider, MD  polyethylene glycol (MIRALAX / GLYCOLAX) packet Take 17 g by mouth daily as needed for mild constipation.    Historical Provider, MD  simvastatin (ZOCOR) 40 MG tablet Take 40 mg by mouth daily.    Historical Provider, MD   BP 146/73  Pulse 103  Temp(Src) 99.1 F (37.3 C) (Oral)  Resp 20  SpO2 99% Physical Exam  Nursing note and vitals reviewed. Constitutional: She is oriented  to person, place, and time. She appears well-developed and well-nourished. No distress.  HENT:  Head: Normocephalic and atraumatic.  Mouth/Throat: Oropharynx is clear and moist.  Eyes: EOM are normal. Pupils are equal, round, and reactive to light.  Neck: Normal range of motion. Neck supple.  Cardiovascular: Normal rate and regular rhythm.  Exam reveals no friction rub.   No murmur heard. Pulmonary/Chest: Effort normal and breath sounds normal. No respiratory distress. She has no wheezes. She has no rales.  Abdominal: Soft. She exhibits no distension. There is tenderness (LLQ mild tenderness on deep palpation). There is no rebound and no guarding.  Musculoskeletal: Normal range of motion. She exhibits no edema.  Neurological: She is alert and oriented to person, place, and time.  Skin: She is not diaphoretic.    ED Course  Procedures (including critical care time) Labs Review Labs Reviewed  URINALYSIS, ROUTINE W REFLEX MICROSCOPIC  CBC  COMPREHENSIVE METABOLIC PANEL    Imaging Review US Transvaginal Non-ob  11/12/2013   CLINICAL DATA:  Left lower abdominal/ pelvic pain  EXAM: TRANSABDOMINAL AND TRANSVAGINAL ULTRASOUND OF PELVIS  TECHNIQUE: Study was performed transabdominally to optimize pelvic field of view evaluation and transvaginally to optimize internal visceral architecture evaluation.  COMPARISON:  CT abdomen and pelvis October 19, 2013  FINDINGS: Uterus  Measurements: 7.0 x 3.8 x 6.6 cm. No fibroids or other mass visualized.  Endometrium  Thickness: 2 mm.  No focal abnormality visualized.  Right ovary  Not seen by either transabdominal or transvaginal technique.  Left ovary  Measurements: 1.3 x 1.1 x 1.5 cm. Normal appearance/no adnexal mass.  Other findings  No free fluid.  IMPRESSION: Right ovary not visualize, probably due to postmenopausal state. Left ovary small but normal in appearance. No pelvic mass or fluid.   Electronically Signed   By: Lowella Grip M.D.   On:  11/12/2013 09:20   US Pelvis Complete  11/12/2013   CLINICAL DATA:  Left lower abdominal/ pelvic pain  EXAM: TRANSABDOMINAL AND TRANSVAGINAL ULTRASOUND OF PELVIS  TECHNIQUE: Study was performed transabdominally to optimize pelvic field of view evaluation and transvaginally to optimize internal visceral architecture evaluation.  COMPARISON:  CT abdomen and pelvis October 19, 2013  FINDINGS: Uterus  Measurements: 7.0 x 3.8 x 6.6 cm. No fibroids or other mass visualized.  Endometrium  Thickness: 2 mm.  No focal abnormality visualized.  Right ovary  Not seen by either transabdominal or transvaginal technique.  Left ovary  Measurements: 1.3 x 1.1 x 1.5 cm. Normal appearance/no adnexal mass.  Other findings  No free fluid.  IMPRESSION: Right ovary not visualize, probably due  to postmenopausal state. Left ovary small but normal in appearance. No pelvic mass or fluid.   Electronically Signed   By: Lowella Grip M.D.   On: 11/12/2013 09:20     EKG Interpretation None      MDM   Final diagnoses:  UTI (lower urinary tract infection)  Chronic hyponatremia    68F here with abdominal pain. Seen multiple times for her abdominal pain recently, scan <1 month ago for similar pain normal. Diagnosed with UTI yesterday, put on Cipro. States, "I came back because this pain isn't similar to prior UTIs. This isn't me." Labs show worsening hyponatremia over past several lab checks, will repeat labs today. No rebound or guarding on exam. Mild LLQ tenderness on deep palpation, otherwise normal.  UTI on UA. Patient has hyopnatremia, chronic. I spoke with Internal Medicine who knows her well, states she calls 2-3 times daily, has chronic pain, has chronic hyponatremia. Has appt in 4 days with her PCP. Stable for discharge.     Evelina Bucy, MD 11/14/13 1043

## 2013-11-14 NOTE — ED Notes (Signed)
Per EMS pt is from home. States pt has not been feeling well for weeks. Pt complaining of LLQ pain, describes pain as burning. pt complains of "shaking" but states that it is "proably just nerves" but won't take anything for nerves.  Per pt. her urine is clear, told twice she has UTI, but that she doesn't feel like this is a normal UTI and that she feels fatigued and not herself. Pt on Cipro. Previously pt diagnosed with IBS and was put on medication but it was not helping. Pt also takes tylenol PM but it is not helping.  Pt states that her major stressor is her daughter who has cerebral palsy.

## 2013-11-14 NOTE — Progress Notes (Signed)
INTERNAL MEDICINE TEACHING ATTENDING ADDENDUM - Aldine Contes, MD: I reviewed and discussed at the time of visit with the resident Dr. Algis Liming, the patient's medical history, physical examination, diagnosis and results of pertinent tests and treatment and I agree with the patient's care as documented. - It is possible that patient may be having Irritable bowel syndrome. Will refer to GI for further eval

## 2013-11-14 NOTE — ED Notes (Signed)
Pt upset at time of discharge, pt reports she has never had pain like this and dislikes her clinic that she goes to, pt informed of wellness center. Pt informed to continue her antibiotics as prescribed at home.

## 2013-11-14 NOTE — ED Notes (Signed)
MD at bedside. 

## 2013-11-14 NOTE — Discharge Instructions (Signed)
Take pyridium as needed for bladder spasm. Refer to attached documents for more information. Follow up with your doctor as needed.

## 2013-11-14 NOTE — ED Notes (Signed)
Bed: [id number] Expected date:  Expected time:  Means of arrival:  Comments: ems

## 2013-11-14 NOTE — ED Notes (Signed)
Per pt. "I woke up and got scared because of the pain. I have never had a UTI like this. This isn't me."

## 2013-11-14 NOTE — Patient Instructions (Signed)
General Instructions: For your belly pain we have done: 1. Prescription at [facility] for your acid (it is on the list) 2. Made a referral to stomach doctors to help  3. Continue taking your Antibiotics for your urine infection 4. Try to keep drinking fluids   Thank you for bringing your medicines today. This helps [place] keep you safe from mistakes.   Progress Toward Treatment Goals:  Treatment Goal 01/25/2013  Hemoglobin A1C deteriorated  Blood pressure at goal    Self Care Goals & Plans:  Self Care Goal 11/12/2013  Manage my medications take my medicines as prescribed; bring my medications to every visit; refill my medications on time  Monitor my health bring my glucose meter and log to each visit; keep track of my blood pressure; keep track of my blood glucose  Eat healthy foods eat more vegetables; eat foods that are low in salt; eat baked foods instead of fried foods  Be physically active -  Meeting treatment goals -    Home Blood Glucose Monitoring 01/25/2013  Check my blood sugar no home glucose monitoring  When to check my blood sugar -     Care Management & Community Referrals:  Referral 01/25/2013  Referrals made for care management support diabetes educator

## 2013-11-14 NOTE — ED Provider Notes (Signed)
CSN: [id number]     Arrival date & time 11/13/13  2039 History   First MD Initiated Contact with Patient 11/14/13 0004     Chief Complaint  Patient presents with  . Abdominal Pain     (Consider location/radiation/quality/duration/timing/severity/associated sxs/prior Treatment) HPI Comments: Patient is a 58 year old female with a past medical history of hypertension, CKD, and diabetes who presents with lower abdominal pain for the past 3 days. Symptoms started gradually and remained constant since the onset. The pain is burning and severe without radiation. Patient took tylenol at home with provided some relief. Patient has been seen every day in the ED for the past 3 days and was diagnosed with UTI. She was most recently seen this morning where she had a pelvic US which was unremarkable. Patient's labs show no acute changes. Patient is concerned because she has never had this much discomfort with a UTI. Patient is taking Cipro currently.    Past Medical History  Diagnosis Date  . Diabetes mellitus type II, uncontrolled   . Hypertension   . Hyperlipidemia   . Ovarian cyst, left   . Anemia     due to menorrhagia, BL 8-10  . Vaginal cyst     nabothian and bartholin  . CKD (chronic kidney disease) stage 3, GFR 30-59 ml/min     baseline creatinine 1.4-1.7  . Anxiety   . Depression   . Postmenopausal bleeding 06/12/2008  . 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 UTI (lower urinary tract infection)   . IBS (irritable bowel syndrome)   . Chronic back pain   . Chronic abdominal pain   . DDD (degenerative disc disease), lumbar   . Bilateral renal cysts 01/15/2009    Qualifier: Diagnosis of  By: Tyrell Antonio MD, Jerald Kief     Past Surgical History  Procedure Laterality Date  . Cardiac surgery      to repair congenital defect as a child   Family History  Problem Relation Age of Onset  . Stroke Father   . Heart attack Father     Had MI in his 20s  . Stomach cancer  Paternal Grandmother   . Diabetes Maternal Grandmother   . Cerebral palsy Daughter   . Anesthesia problems Neg Hx   . Hypotension Neg Hx   . Malignant hyperthermia Neg Hx   . Pseudochol deficiency Neg Hx    History  Substance Use Topics  . Smoking status: Never Smoker   . Smokeless tobacco: Former Systems developer  . Alcohol Use: No   OB History   Grav Para Term Preterm Abortions TAB SAB Ect Mult Living   3 1 0  2  2   1      Review of Systems  Constitutional: Negative for fever, chills and fatigue.  HENT: Negative for trouble swallowing.   Eyes: Negative for visual disturbance.  Respiratory: Negative for shortness of breath.   Cardiovascular: Negative for chest pain and palpitations.  Gastrointestinal: Positive for abdominal pain. Negative for nausea, vomiting and diarrhea.  Genitourinary: Positive for dysuria. Negative for difficulty urinating.  Musculoskeletal: Negative for arthralgias and neck pain.  Skin: Negative for color change.  Neurological: Negative for dizziness and weakness.  Psychiatric/Behavioral: Negative for dysphoric mood.      Allergies  Review of patient's allergies indicates no known allergies.  Home Medications   Prior to Admission medications   Medication Sig Start Date End Date Taking? Authorizing Provider  aspirin EC 81  MG tablet Take 81 mg by mouth daily.   Yes Historical Provider, MD  ciprofloxacin (CIPRO) 500 MG tablet Take 500 mg by mouth 2 (two) times daily.   Yes Historical Provider, MD  dicyclomine (BENTYL) 20 MG tablet Take 20 mg by mouth 2 (two) times daily.   Yes Historical Provider, MD  docusate sodium (COLACE) 100 MG capsule Take 100 mg by mouth 2 (two) times daily as needed for mild constipation.   Yes Historical Provider, MD  enalapril (VASOTEC) 10 MG tablet Take 10 mg by mouth daily.   Yes Historical Provider, MD  insulin detemir (LEVEMIR) 100 UNIT/ML injection Inject 30 Units into the skin every evening.    Yes Historical Provider, MD   pioglitazone (ACTOS) 15 MG tablet Take 15 mg by mouth daily.   Yes Historical Provider, MD  polyethylene glycol (MIRALAX / GLYCOLAX) packet Take 17 g by mouth daily as needed for mild constipation.   Yes Historical Provider, MD  simvastatin (ZOCOR) 40 MG tablet Take 40 mg by mouth daily.   Yes Historical Provider, MD   BP 140/64  Pulse 95  Temp(Src) 98.2 F (36.8 C) (Oral)  Resp 18  Ht 5\' 4"  (1.626 m)  Wt 162 lb (73.483 kg)  BMI 27.79 kg/m2  SpO2 96% Physical Exam  Nursing note and vitals reviewed. Constitutional: She is oriented to person, place, and time. She appears well-developed and well-nourished. No distress.  HENT:  Head: Normocephalic and atraumatic.  Eyes: Conjunctivae are normal.  Cardiovascular: Normal rate and regular rhythm.  Exam reveals no gallop and no friction rub.   No murmur heard. Pulmonary/Chest: Effort normal and breath sounds normal. She has no wheezes. She has no rales. She exhibits no tenderness.  Abdominal: Soft. She exhibits no distension. There is tenderness. There is no rebound and no guarding.  Suprapubic tenderness to palpation. No other focal tenderness.   Musculoskeletal: Normal range of motion.  Neurological: She is alert and oriented to person, place, and time. Coordination normal.  Speech is goal-oriented. Moves limbs without ataxia.   Skin: Skin is warm and dry.  Psychiatric: She has a normal mood and affect. Her behavior is normal.    ED Course  Procedures (including critical care time) Labs Review Labs Reviewed - No data to display  Imaging Review US Transvaginal Non-ob  11/12/2013   CLINICAL DATA:  Left lower abdominal/ pelvic pain  EXAM: TRANSABDOMINAL AND TRANSVAGINAL ULTRASOUND OF PELVIS  TECHNIQUE: Study was performed transabdominally to optimize pelvic field of view evaluation and transvaginally to optimize internal visceral architecture evaluation.  COMPARISON:  CT abdomen and pelvis October 19, 2013  FINDINGS: Uterus   Measurements: 7.0 x 3.8 x 6.6 cm. No fibroids or other mass visualized.  Endometrium  Thickness: 2 mm.  No focal abnormality visualized.  Right ovary  Not seen by either transabdominal or transvaginal technique.  Left ovary  Measurements: 1.3 x 1.1 x 1.5 cm. Normal appearance/no adnexal mass.  Other findings  No free fluid.  IMPRESSION: Right ovary not visualize, probably due to postmenopausal state. Left ovary small but normal in appearance. No pelvic mass or fluid.   Electronically Signed   By: Lowella Grip M.D.   On: 11/12/2013 09:20   US Pelvis Complete  11/12/2013   CLINICAL DATA:  Left lower abdominal/ pelvic pain  EXAM: TRANSABDOMINAL AND TRANSVAGINAL ULTRASOUND OF PELVIS  TECHNIQUE: Study was performed transabdominally to optimize pelvic field of view evaluation and transvaginally to optimize internal visceral architecture evaluation.  COMPARISON:  CT abdomen and pelvis October 19, 2013  FINDINGS: Uterus  Measurements: 7.0 x 3.8 x 6.6 cm. No fibroids or other mass visualized.  Endometrium  Thickness: 2 mm.  No focal abnormality visualized.  Right ovary  Not seen by either transabdominal or transvaginal technique.  Left ovary  Measurements: 1.3 x 1.1 x 1.5 cm. Normal appearance/no adnexal mass.  Other findings  No free fluid.  IMPRESSION: Right ovary not visualize, probably due to postmenopausal state. Left ovary small but normal in appearance. No pelvic mass or fluid.   Electronically Signed   By: Lowella Grip M.D.   On: 11/12/2013 09:20     EKG Interpretation None      MDM   Final diagnoses:  UTI (lower urinary tract infection)    12:22 AM Patient will have pyridium here and be discharged with prescription for pyridium. Vitals stable and patient afebrile. Patient instructed to continue taking Cipro. Patient will follow up with PCP.     Alvina Chou, PA-C 11/14/13 [phone]

## 2013-11-14 NOTE — Discharge Instructions (Signed)

## 2013-11-14 NOTE — Assessment & Plan Note (Signed)
This seems to be an ongoing concern and problem that is being actively addressed by her PCP. Currently upon chart review all of her tests including CT abd/pelvis in 9/15, transvaginal and abdominal ultrasound done 10/15 were both normal. The patient is reporting some relief with Bentyl and continues to undergo her treatment for UTI with Cipro.  -Screening colonoscopy a GI referral made today -Again recommended psych as the patient improved with some active listening provided during this visit resources including crisis line and [place] family services was provided -Famotidine 40 mg twice a day was started and can be followed up at next visit

## 2013-11-15 ENCOUNTER — Encounter: Payer: Self-pay | Admitting: *Deleted

## 2013-11-15 NOTE — ED Provider Notes (Signed)
Medical screening examination/treatment/procedure(s) were performed by non-physician practitioner and as supervising physician I was immediately available for consultation/collaboration.   EKG Interpretation None       Varney Biles, MD 11/15/13 1030

## 2013-11-15 NOTE — Discharge Planning (Signed)
[place] Specialist  Patient is a current orange card holder and established as a patient at [facility]. Pt refused P4CC case management services at this time. My contact information provided for any future questions or concerns. No other needs identified at this time.

## 2013-11-16 LAB — URINE CULTURE
Colony Count: NO GROWTH
Organism ID, Bacteria: NO GROWTH

## 2013-11-18 ENCOUNTER — Emergency Department (HOSPITAL_COMMUNITY)
Admission: EM | Admit: 2013-11-18 | Discharge: 2013-11-19 | Disposition: A | Discharge status: Home / Self Care | Payer: No Typology Code available for payment source | Attending: Emergency Medicine | Admitting: Emergency Medicine

## 2013-11-18 ENCOUNTER — Encounter (HOSPITAL_COMMUNITY): Payer: Self-pay | Admitting: Emergency Medicine

## 2013-11-18 ENCOUNTER — Other Ambulatory Visit: Payer: Self-pay | Admitting: Internal Medicine

## 2013-11-18 DIAGNOSIS — Z3202 Encounter for pregnancy test, result negative: Secondary | ICD-10-CM | POA: Insufficient documentation

## 2013-11-18 DIAGNOSIS — Z79899 Other long term (current) drug therapy: Secondary | ICD-10-CM | POA: Insufficient documentation

## 2013-11-18 DIAGNOSIS — Z792 Long term (current) use of antibiotics: Secondary | ICD-10-CM | POA: Insufficient documentation

## 2013-11-18 DIAGNOSIS — R1012 Left upper quadrant pain: Secondary | ICD-10-CM | POA: Insufficient documentation

## 2013-11-18 DIAGNOSIS — Z862 Personal history of diseases of the blood and blood-forming organs and certain disorders involving the immune mechanism: Secondary | ICD-10-CM | POA: Insufficient documentation

## 2013-11-18 DIAGNOSIS — I129 Hypertensive chronic kidney disease with stage 1 through stage 4 chronic kidney disease, or unspecified chronic kidney disease: Secondary | ICD-10-CM | POA: Insufficient documentation

## 2013-11-18 DIAGNOSIS — K589 Irritable bowel syndrome without diarrhea: Secondary | ICD-10-CM | POA: Insufficient documentation

## 2013-11-18 DIAGNOSIS — R109 Unspecified abdominal pain: Secondary | ICD-10-CM

## 2013-11-18 DIAGNOSIS — Z8742 Personal history of other diseases of the female genital tract: Secondary | ICD-10-CM | POA: Insufficient documentation

## 2013-11-18 DIAGNOSIS — Z8659 Personal history of other mental and behavioral disorders: Secondary | ICD-10-CM | POA: Insufficient documentation

## 2013-11-18 DIAGNOSIS — N183 Chronic kidney disease, stage 3 (moderate): Secondary | ICD-10-CM | POA: Insufficient documentation

## 2013-11-18 DIAGNOSIS — Z8774 Personal history of (corrected) congenital malformations of heart and circulatory system: Secondary | ICD-10-CM | POA: Insufficient documentation

## 2013-11-18 DIAGNOSIS — G8929 Other chronic pain: Secondary | ICD-10-CM | POA: Insufficient documentation

## 2013-11-18 DIAGNOSIS — Z7982 Long term (current) use of aspirin: Secondary | ICD-10-CM | POA: Insufficient documentation

## 2013-11-18 DIAGNOSIS — E119 Type 2 diabetes mellitus without complications: Secondary | ICD-10-CM | POA: Insufficient documentation

## 2013-11-18 DIAGNOSIS — E785 Hyperlipidemia, unspecified: Secondary | ICD-10-CM | POA: Insufficient documentation

## 2013-11-18 DIAGNOSIS — Z794 Long term (current) use of insulin: Secondary | ICD-10-CM | POA: Insufficient documentation

## 2013-11-18 DIAGNOSIS — Z87440 Personal history of urinary (tract) infections: Secondary | ICD-10-CM | POA: Insufficient documentation

## 2013-11-18 LAB — COMPREHENSIVE METABOLIC PANEL
ALT: 17 U/L (ref 0–35)
ANION GAP: 15 (ref 5–15)
AST: 22 U/L (ref 0–37)
Albumin: 4.1 g/dL (ref 3.5–5.2)
Alkaline Phosphatase: 75 U/L (ref 39–117)
BILIRUBIN TOTAL: 0.3 mg/dL (ref 0.3–1.2)
BUN: 20 mg/dL (ref 6–23)
CALCIUM: 9.2 mg/dL (ref 8.4–10.5)
CHLORIDE: 94 meq/L — AB (ref 96–112)
CO2: 25 meq/L (ref 19–32)
CREATININE: 1.44 mg/dL — AB (ref 0.50–1.10)
GFR, EST AFRICAN AMERICAN: 46 mL/min — AB (ref >90)
GFR, EST NON AFRICAN AMERICAN: 40 mL/min — AB (ref >90)
Glucose, Bld: 289 mg/dL — ABNORMAL HIGH (ref 70–99)
Potassium: 4.2 mEq/L (ref 3.7–5.3)
Sodium: 134 mEq/L — ABNORMAL LOW (ref 137–147)
Total Protein: 7.2 g/dL (ref 6.0–8.3)

## 2013-11-18 LAB — URINE MICROSCOPIC-ADD ON

## 2013-11-18 LAB — CBC WITH DIFFERENTIAL/PLATELET
Basophils Absolute: 0 10*3/uL (ref 0.0–0.1)
Basophils Relative: 0 % (ref 0–1)
Eosinophils Absolute: 0 10*3/uL (ref 0.0–0.7)
Eosinophils Relative: 0 % (ref 0–5)
HEMATOCRIT: 36.5 % (ref 36.0–46.0)
HEMOGLOBIN: 12.2 g/dL (ref 12.0–15.0)
LYMPHS PCT: 26 % (ref 12–46)
Lymphs Abs: 2.5 10*3/uL (ref 0.7–4.0)
MCH: 29.8 pg (ref 26.0–34.0)
MCHC: 33.4 g/dL (ref 30.0–36.0)
MCV: 89 fL (ref 78.0–100.0)
MONO ABS: 0.4 10*3/uL (ref 0.1–1.0)
MONOS PCT: 4 % (ref 3–12)
Neutro Abs: 6.7 10*3/uL (ref 1.7–7.7)
Neutrophils Relative %: 70 % (ref 43–77)
Platelets: 269 10*3/uL (ref 150–400)
RBC: 4.1 MIL/uL (ref 3.87–5.11)
RDW: 12.1 % (ref 11.5–15.5)
WBC: 9.6 10*3/uL (ref 4.0–10.5)

## 2013-11-18 LAB — URINALYSIS, ROUTINE W REFLEX MICROSCOPIC
BILIRUBIN URINE: NEGATIVE
Glucose, UA: 250 mg/dL — AB
Hgb urine dipstick: NEGATIVE
KETONES UR: NEGATIVE mg/dL
NITRITE: NEGATIVE
PROTEIN: NEGATIVE mg/dL
Specific Gravity, Urine: 1.011 (ref 1.005–1.030)
Urobilinogen, UA: 0.2 mg/dL (ref 0.0–1.0)
pH: 5.5 (ref 5.0–8.0)

## 2013-11-18 LAB — POC URINE PREG, ED: Preg Test, Ur: NEGATIVE

## 2013-11-18 LAB — LIPASE, BLOOD: LIPASE: 22 U/L (ref 11–59)

## 2013-11-18 MED ORDER — SODIUM CHLORIDE 0.9 % IV BOLUS (SEPSIS)
1000.0000 mL | Freq: Once | INTRAVENOUS | Status: AC
  Administered 2013-11-18: 1000 mL via INTRAVENOUS

## 2013-11-18 NOTE — Discharge Instructions (Signed)
Please call your doctor for a followup appointment within 24-48 hours. When you talk to your doctor please let them know that you were seen in the emergency department and have them acquire all of your records so that they can discuss the findings with you and formulate a treatment plan to fully care for your new and ongoing problems. Please rest and stay hydrated Please call and set-up an appointment with your primary care provider, gastroenterology, urology Please continue to finish antibiotics as prescribed Please continue to monitor symptoms closely and if symptoms are to worsen or change (fever greater than 101, chills, sweating, nausea, vomiting, chest pain, shortness of breathe, difficulty breathing, weakness, numbness, tingling, worsening or changes to pain pattern, blood in the stools, black tarry stools, blood in the urine, pain with urination) please report back to the Emergency Department immediately.    Abdominal Pain Many things can cause abdominal pain. Usually, abdominal pain is not caused by a disease and will improve without treatment. It can often be observed and treated at home. Your health care provider will do a physical exam and possibly order blood tests and X-rays to help determine the seriousness of your pain. However, in many cases, more time must pass before a clear cause of the pain can be found. Before that point, your health care provider may not know if you need more testing or further treatment. HOME CARE INSTRUCTIONS  Monitor your abdominal pain for any changes. The following actions may help to alleviate any discomfort you are experiencing:  Only take over-the-counter or prescription medicines as directed by your health care provider.  Do not take laxatives unless directed to do so by your health care provider.  Try a clear liquid diet (broth, tea, or water) as directed by your health care provider. Slowly move to a bland diet as tolerated. SEEK MEDICAL CARE  IF:  You have unexplained abdominal pain.  You have abdominal pain associated with nausea or diarrhea.  You have pain when you urinate or have a bowel movement.  You experience abdominal pain that wakes you in the night.  You have abdominal pain that is worsened or improved by eating food.  You have abdominal pain that is worsened with eating fatty foods.  You have a fever. SEEK IMMEDIATE MEDICAL CARE IF:   Your pain does not go away within 2 hours.  You keep throwing up (vomiting).  Your pain is felt only in portions of the abdomen, such as the right side or the left lower portion of the abdomen.  You pass bloody or black tarry stools. MAKE SURE YOU:  Understand these instructions.   Will watch your condition.   Will get help right away if you are not doing well or get worse.  Document Released: 11/03/2004 Document Revised: 01/29/2013 Document Reviewed: 10/03/2012 [facility] Patient Information 2015 [place], [place]. This information is not intended to replace advice given to you by your health care provider. Make sure you discuss any questions you have with your health care provider.   Emergency Department Resource Guide 1) Find a Doctor and Pay Out of Pocket Although you won't have to find out who is covered by your insurance plan, it is a good idea to ask around and get recommendations. You will then need to call the office and see if the doctor you have chosen will accept you as a new patient and what types of options they offer for patients who are self-pay. Some doctors offer discounts or will set  up payment plans for their patients who do not have insurance, but you will need to ask so you aren't surprised when you get to your appointment.  2) Contact Your Local Health Department Not all health departments have doctors that can see patients for sick visits, but many do, so it is worth a call to see if yours does. If you don't know where your local health department is,  you can check in your phone book. The CDC also has a tool to help you locate your state's health department, and many state websites also have listings of all of their local health departments.  3) Find a [facility] If your illness is not likely to be very severe or complicated, you may want to try a walk in clinic. These are popping up all over the country in pharmacies, drugstores, and shopping centers. They're usually staffed by nurse practitioners or physician assistants that have been trained to treat common illnesses and complaints. They're usually fairly quick and inexpensive. However, if you have serious medical issues or chronic medical problems, these are probably not your best option.  No Primary Care Doctor: - Call Health Connect at  [phone] - they can help you locate a primary care doctor that  accepts your insurance, provides certain services, etc. - Physician Referral Service- [phone]  Chronic Pain Problems: Organization         Address  Phone   Notes  [facility]  [phone] Patients need to be referred by their primary care doctor.   Medication Assistance: Organization         Address  Phone   Notes  [facility] Medication [facility] [address] [place], [place] [address] [phone] --Must be a resident of [facility] -- Must have NO insurance coverage whatsoever (no Medicaid/ Medicare, etc.) -- The pt. MUST have a primary care doctor that directs their care regularly and follows them in the community   MedAssist  [phone]   [employer]  [phone]    Agencies that provide inexpensive medical care: Organization         Address  Phone   Notes  [place]  5632812406   [facility]    [phone]   [facility] [place], [place] [address] [phone]   [place] [address], [place] [phone]   Planned Parenthood    [phone]   [facility]    [phone]   [place] and [place] [address], [place] Phone:  [phone], Fax:  [phone] Hours of Operation:  9 am - 6 pm, M-F.  Also accepts Medicaid/Medicare and self-pay.  [facility] for [address] [place], [address], [place] Phone: [phone], Fax: [phone]. Hours of Operation:  8:30 am - 5:30 pm, M-F.  Also accepts Medicaid and self-pay.  [facility] [place] [address], [place] Phone: [phone]   [place], [place], [place] [phone], Ext. [phone] Mondays & Thursdays: 7-9 AM.  First 15 patients are seen on a first come, first serve basis.    [place] Providers:  Organization         Address  Phone   Notes  [facility] [address], [place] [phone] Also  accepts self-pay patients.  [facility] [address], [place]  [phone]   [place], [address], [place] [phone]   [facility] [address], [place] [phone]   Lucianne Lei [address], [place]   [phone] Only accepts [place] Access [place] patients after they have their name applied to their card.   Self-Pay (no insurance) in [facility]:  Organization         Address  Phone   Notes  Sickle Cell Patients, [facility] [place] [phone]   [facility] [place] [phone]   [facility] [place]  [place], [place], [place] [phone]   [facility]/Dr. Osei-Bonsu  [address] [place] or [address], [place] [phone] Phone number for both [place] and [place] locations  is the same.  Urgent Medical and [facility] [address] [place] [phone]   [facility] [address] [place] [address], [place] or [address] [phone]   [facility] [address], [place] [phone], phone; [phone], fax Sees patients 1st and 3rd Saturday of every month.  Must not qualify for public or private insurance (i.e. Medicaid, Medicare, [place] Health Choice, Veterans' Benefits)  Household income should be no more than 200% of the poverty level The clinic cannot treat you if you are pregnant or think you are pregnant  Sexually transmitted diseases are not treated at the clinic.    Dental Care: Organization         Address  Phone  Notes  [facility] Department of [facility] [phone] Accepts children up to age 72 who are enrolled in [place] or [place]; pregnant women with a Medicaid card; and children who have applied for Medicaid or [place] Health Choice, but were declined, whose parents can pay a reduced fee at time of service.  [facility] Department of [facility]  [address], [place] [phone] Accepts children up to age 36 who are enrolled in [place] or [place]; pregnant women with a Medicaid card; and children who have applied for Medicaid or [place] Health Choice, but were declined, whose parents can pay a reduced fee at time of service.  [place] Adult Dental Access PROGRAM  [place] [phone] Patients are seen by appointment only. Walk-ins are not accepted. [place] will see patients 94 years of age and older. Monday - Tuesday (8am-5pm) Most Wednesdays (8:30-5pm) $30 per visit, cash only  [facility] Adult Dental Access PROGRAM  [address], [facility] [phone] Patients are seen by appointment only. Walk-ins are not accepted. [place] will see  patients 61 years of age and older. One Wednesday Evening (Monthly: Volunteer Based).  $30 per visit, cash only  [place]  [phone] for adults; Children under age 71, call Graduate Pediatric Dentistry at [phone]. Children aged 51-14, please call [phone] to request a pediatric application.  Dental services are provided in all areas of dental care including fillings, crowns and bridges, complete and partial dentures, implants, gum treatment, root canals, and extractions. Preventive care is also provided. Treatment is  provided to both adults and children. Patients are selected via a lottery and there is often a waiting list.   [facility] [address] [place]  [phone] [email or web address]   [facility] [address] [place], [place] [phone], Ext. [phone] Second and Fourth Thursday of each month, opens at 6:30 AM; Clinic ends at 9 AM.  Patients are seen on a first-come first-served basis, and a limited number are seen during each clinic.   [facility]  [address] Hillard Danker [place], [place] [phone]   Eligibility Requirements You must have lived in [place], [place], or [place] counties for at least the last three months.   You cannot be eligible for state or federal sponsored [employer], including [employer], [place], or [employer].   You generally cannot be eligible for healthcare insurance through your employer.    How to apply: Eligibility screenings are held every Tuesday and Wednesday afternoon from 1:00 pm until 4:00 pm. You do not need an appointment for the interview!  [facility] [address], [place], [place]   [place]  [place] Department  [place]  [phone]    Behavioral Health Resources in the Community: Intensive Outpatient  Programs Organization         Address  Phone  Notes  [place] Lyman. [address] [place], [place] [phone]   [facility] Outpatient [address] [place], [place]   ADS: Alcohol & Drug Svcs [address] [place], [place]   [place] [address],  [place], [place] or [phone]   Substance Abuse Resources Organization         Address  Phone  Notes  Alcohol and Drug Services  [phone]   [place]  [phone]   [facility] [place]   Chinita Pester  [phone]   Residential & Outpatient Substance Abuse Program  [phone]   Psychological Services Organization         Address  Phone  Notes  [facility]  [place]  [phone]   [place] [address], [place] or [phone]    [place] Crisis Teams Organization         Address  Phone  Notes  [facility], Mobile Crisis Care Unit  [phone]   [facility]ive Psychotherapeutic Services  [address] [place], [place]   [place] [address], [place] [phone]    Self-Help/Support Groups Organization         Address  Phone             Notes  [place]. of [place] - variety of support groups  [place] Call for more information  Narcotics Anonymous (NA), [facility] [address], [employer] [place]  2 meetings at this location   Special educational needs teacher         Address  Phone  Notes  ASAP Residential Treatment [place],    [place]  [phone]   [facility]  [address], [place] [phone], [place], [place]   [place], [place] [phone] Admissions: 8am-3pm M-F  Incentives Substance [place] [address],    [place], [place]  [phone]   [facility] [address] Jadene Pierini [place], [place]   The [place].,  [place], [place]   [facility]ight Programs - Intensive Outpatient [address] Kristeen [address], [place], [place]   [facility] ([place] [address]  [place], [place] [phone] or [phone]   [facility]idential Treatment Services (RTS) [address], [place], [place] Accepts Medicaid  [place] [address],  [place] [phone] Substance Abuse/Addiction Treatment   [facility] Organization         Address  Phone  Notes  [facility]  [phone]   Domenic Schwab, PhD [address] Arlis Porta [place], [place]   [phone] or [phone]   [place], [place] [phone]   [facility] [address], [place], [place] [phone] Insurance/Medicaid/sponsorship through [facility] and Families [address], [address] [place]                                    [place], [place] [phone] [place] [address], [place] [phone]    Dr. Adele Schilder  [phone]   [facility] [place] Dept. 1) [address] [place] 2) [place] 3)  [place] [address], [place] [phone]  [phone]   [place] [phone] or [phone] (After Hours)

## 2013-11-18 NOTE — ED Notes (Signed)
Pt was given ginger ale.

## 2013-11-18 NOTE — ED Provider Notes (Signed)
CSN: [id number]     Arrival date & time 11/18/13  1552 History   First MD Initiated Contact with Patient 11/18/13 1918     Chief Complaint  Patient presents with  . Abdominal Pain     (Consider location/radiation/quality/duration/timing/severity/associated sxs/prior Treatment) The history is provided by the patient. No language interpreter was used.  [name] is a 58 y/o F with PMHx of DM, HTN, HLD, ovarian cyst on the left, anemia, depression, anxiety, CKD, chronic abdominal pain, chronic back pain, DDD, UTI, IBS, postmenopausal bleeding presenting to the ED with abdominal pain that has been ongoing for the past month. Patient reported that the pain is localized to the left side of the abdomen described as a "warm" discomfort that is intermittent without radiation. Patient reported that she has been having decreased appetite along with the abdominal pain. Stated that she has been seen and assessed in the ED setting and outpatient for this with no answers. Patient reported that she has been taking her antibiotics as prescribed - Ciprofloxacin, stated that she has a couple of days left. Patient reported that she has an appointment with physician tomorrow. Denied nausea, vomiting, diarrhea, hematochezia, chest pain, shortness of breath, difficulty breathing, dizziness, headache, numbness, tingling, dysuria, hematuria, abnormal vaginal bleeding. PCP Dr. Denton Brick  Past Medical History  Diagnosis Date  . Diabetes mellitus type II, uncontrolled   . Hypertension   . Hyperlipidemia   . Ovarian cyst, left   . Anemia     due to menorrhagia, BL 8-10  . Vaginal cyst     nabothian and bartholin  . CKD (chronic kidney disease) stage 3, GFR 30-59 ml/min     baseline creatinine 1.4-1.7  . Anxiety   . Depression   . Postmenopausal bleeding 06/12/2008  . 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 UTI (lower urinary tract infection)   . IBS (irritable bowel syndrome)   . Chronic  back pain   . Chronic abdominal pain   . DDD (degenerative disc disease), lumbar   . Bilateral renal cysts 01/15/2009    Qualifier: Diagnosis of  By: Tyrell Antonio MD, Jerald Kief     Past Surgical History  Procedure Laterality Date  . Cardiac surgery      to repair congenital defect as a child   Family History  Problem Relation Age of Onset  . Stroke Father   . Heart attack Father     Had MI in his 54s  . Stomach cancer Paternal Grandmother   . Diabetes Maternal Grandmother   . Cerebral palsy Daughter   . Anesthesia problems Neg Hx   . Hypotension Neg Hx   . Malignant hyperthermia Neg Hx   . Pseudochol deficiency Neg Hx    History  Substance Use Topics  . Smoking status: Never Smoker   . Smokeless tobacco: Former Systems developer  . Alcohol Use: No   OB History   Grav Para Term Preterm Abortions TAB SAB Ect Mult Living   3 1 0  2  2   1      Review of Systems  Constitutional: Negative for fever and chills.  Respiratory: Negative for chest tightness and shortness of breath.   Cardiovascular: Negative for chest pain.  Gastrointestinal: Positive for abdominal pain. Negative for nausea, vomiting, diarrhea, constipation, blood in stool and anal bleeding.  Genitourinary: Negative for dysuria.  Neurological: Negative for dizziness and headaches.      Allergies  Review of patient's allergies indicates no known  allergies.  Home Medications   Prior to Admission medications   Medication Sig Start Date End Date Taking? Authorizing Provider  acetaminophen (TYLENOL) 325 MG tablet Take 650 mg by mouth every 6 (six) hours as needed for moderate pain.   Yes Historical Provider, MD  aspirin EC 81 MG tablet Take 81 mg by mouth daily.   Yes Historical Provider, MD  ciprofloxacin (CIPRO) 500 MG tablet Take 500 mg by mouth 2 (two) times daily.   Yes Historical Provider, MD  dicyclomine (BENTYL) 20 MG tablet Take 20 mg by mouth 2 (two) times daily.   Yes Historical Provider, MD   diphenhydramine-acetaminophen (TYLENOL PM) 25-500 MG TABS Take 2 tablets by mouth at bedtime as needed.   Yes Historical Provider, MD  enalapril (VASOTEC) 10 MG tablet Take 10 mg by mouth daily.   Yes Historical Provider, MD  famotidine (PEPCID) 40 MG tablet Take 1 tablet (40 mg total) by mouth 2 (two) times daily. 11/14/13  Yes Clinton Gallant, MD  Insulin Detemir (LEVEMIR FLEXTOUCH) 100 UNIT/ML Pen Inject 30 Units into the skin every evening.    Yes Historical Provider, MD  pioglitazone (ACTOS) 15 MG tablet Take 15 mg by mouth daily.   Yes Historical Provider, MD  simvastatin (ZOCOR) 40 MG tablet Take 40 mg by mouth daily.   Yes Historical Provider, MD   BP 138/79  Pulse 89  Temp(Src) 98.2 F (36.8 C) (Oral)  Resp 18  SpO2 97% Physical Exam  Nursing note and vitals reviewed. Constitutional: She is oriented to person, place, and time. She appears well-developed and well-nourished. No distress.  HENT:  Head: Normocephalic and atraumatic.  Mouth/Throat: Oropharynx is clear and moist. No oropharyngeal exudate.  Eyes: Conjunctivae and EOM are normal. Pupils are equal, round, and reactive to light. Right eye exhibits no discharge. Left eye exhibits no discharge.  Neck: Normal range of motion. Neck supple. No tracheal deviation present.  Negative neck stiffness Negative nuchal rigidity  Negative cervical lymphadenopathy  Negative meningeal signs   Cardiovascular: Normal rate, regular rhythm and normal heart sounds.  Exam reveals no friction rub.   No murmur heard. Pulses:      Radial pulses are 2+ on the right side, and 2+ on the left side.       Dorsalis pedis pulses are 2+ on the right side, and 2+ on the left side.  Pulmonary/Chest: Effort normal and breath sounds normal. No respiratory distress. She has no wheezes. She has no rales.  Abdominal: Soft. Normal appearance and bowel sounds are normal. She exhibits no distension. There is tenderness in the left upper quadrant. There is no rebound  and no guarding.  Negative abdominal distension  BS normoactive in all 4 quadrants Abdomen soft upon palpation  Mild discomfort upon palpation to the left upper quadrant Negative peritoneal signs  Negative guarding or rigidity noted  Musculoskeletal: Normal range of motion. She exhibits no edema and no tenderness.  Full ROM to upper and lower extremities without difficulty noted, negative ataxia noted.  Lymphadenopathy:    She has no cervical adenopathy.  Neurological: She is alert and oriented to person, place, and time. No cranial nerve deficit. She exhibits normal muscle tone. Coordination normal.  Skin: Skin is warm and dry. No rash noted. She is not diaphoretic. No erythema.  Psychiatric: She has a normal mood and affect. Her behavior is normal. Thought content normal.    ED Course  Procedures (including critical care time)  Results for orders placed during the  hospital encounter of 11/18/13  CBC WITH DIFFERENTIAL      Result Value Ref Range   WBC 9.6  4.0 - 10.5 K/uL   RBC 4.10  3.87 - 5.11 MIL/uL   Hemoglobin 12.2  12.0 - 15.0 g/dL   HCT 36.5  36.0 - 46.0 %   MCV 89.0  78.0 - 100.0 fL   MCH 29.8  26.0 - 34.0 pg   MCHC 33.4  30.0 - 36.0 g/dL   RDW 12.1  11.5 - 15.5 %   Platelets 269  150 - 400 K/uL   Neutrophils Relative % 70  43 - 77 %   Neutro Abs 6.7  1.7 - 7.7 K/uL   Lymphocytes Relative 26  12 - 46 %   Lymphs Abs 2.5  0.7 - 4.0 K/uL   Monocytes Relative 4  3 - 12 %   Monocytes Absolute 0.4  0.1 - 1.0 K/uL   Eosinophils Relative 0  0 - 5 %   Eosinophils Absolute 0.0  0.0 - 0.7 K/uL   Basophils Relative 0  0 - 1 %   Basophils Absolute 0.0  0.0 - 0.1 K/uL  COMPREHENSIVE METABOLIC PANEL      Result Value Ref Range   Sodium 134 (*) 137 - 147 mEq/L   Potassium 4.2  3.7 - 5.3 mEq/L   Chloride 94 (*) 96 - 112 mEq/L   CO2 25  19 - 32 mEq/L   Glucose, Bld 289 (*) 70 - 99 mg/dL   BUN 20  6 - 23 mg/dL   Creatinine, Ser 1.44 (*) 0.50 - 1.10 mg/dL   Calcium 9.2  8.4 -  10.5 mg/dL   Total Protein 7.2  6.0 - 8.3 g/dL   Albumin 4.1  3.5 - 5.2 g/dL   AST 22  0 - 37 U/L   ALT 17  0 - 35 U/L   Alkaline Phosphatase 75  39 - 117 U/L   Total Bilirubin 0.3  0.3 - 1.2 mg/dL   GFR calc non Af Amer 40 (*) >90 mL/min   GFR calc Af Amer 46 (*) >90 mL/min   Anion gap 15  5 - 15  LIPASE, BLOOD      Result Value Ref Range   Lipase 22  11 - 59 U/L  URINALYSIS, ROUTINE W REFLEX MICROSCOPIC      Result Value Ref Range   Color, Urine YELLOW  YELLOW   APPearance CLOUDY (*) CLEAR   Specific Gravity, Urine 1.011  1.005 - 1.030   pH 5.5  5.0 - 8.0   Glucose, UA 250 (*) NEGATIVE mg/dL   Hgb urine dipstick NEGATIVE  NEGATIVE   Bilirubin Urine NEGATIVE  NEGATIVE   Ketones, ur NEGATIVE  NEGATIVE mg/dL   Protein, ur NEGATIVE  NEGATIVE mg/dL   Urobilinogen, UA 0.2  0.0 - 1.0 mg/dL   Nitrite NEGATIVE  NEGATIVE   Leukocytes, UA TRACE (*) NEGATIVE  URINE MICROSCOPIC-ADD ON      Result Value Ref Range   Squamous Epithelial / LPF FEW (*) RARE   WBC, UA 3-6  <3 WBC/hpf   RBC / HPF 0-2  <3 RBC/hpf   Bacteria, UA RARE  RARE  POC URINE PREG, ED      Result Value Ref Range   Preg Test, Ur NEGATIVE  NEGATIVE    Labs Review Labs Reviewed  COMPREHENSIVE METABOLIC PANEL - Abnormal; Notable for the following:    Sodium 134 (*)    Chloride 94 (*)  Glucose, Bld 289 (*)    Creatinine, Ser 1.44 (*)    GFR calc non Af Amer 40 (*)    GFR calc Af Amer 46 (*)    All other components within normal limits  URINALYSIS, ROUTINE W REFLEX MICROSCOPIC - Abnormal; Notable for the following:    APPearance CLOUDY (*)    Glucose, UA 250 (*)    Leukocytes, UA TRACE (*)    All other components within normal limits  URINE MICROSCOPIC-ADD ON - Abnormal; Notable for the following:    Squamous Epithelial / LPF FEW (*)    All other components within normal limits  URINE CULTURE  CBC WITH DIFFERENTIAL  LIPASE, BLOOD  POC URINE PREG, ED    Imaging Review No results found.   EKG  Interpretation None      MDM   Final diagnoses:  Chronic abdominal pain    Medications  sodium chloride 0.9 % bolus 1,000 mL (0 mLs Intravenous Stopped 11/18/13 2154)   Filed Vitals:   11/18/13 1827 11/18/13 1950 11/18/13 2153 11/18/13 2346  BP: 148/66  138/88 138/79  Pulse: 98  96 89  Temp:  98.2 F (36.8 C) 98.2 F (36.8 C) 98.2 F (36.8 C)  TempSrc:  Oral Oral Oral  Resp: 16  18 18   SpO2: 97%  96% 97%    This provider reviewed the patient's chart. Patient was last seen and assessed in the ED setting on 11/14/2013 regarding abdominal pain localized to the LLQ described as a burning sensation. Labs unremarkable, US pelvic unremarkable for acute abnormalities. Patient was diagnosed with a UTI and started on Ciprofloxacin. It was noted in the previous ED note that patient has been seen and assessed many times regarding this abdominal pain. Patient reported that she has yet to follow-up as an outpatient with a specialist secondary to insurance issues - stated that she called [facility] the other day and was mad due to them not taking her insurance. CT abdomen and pelvis with contrast performed on 10/19/2013 - unremarkable for acute abnormalities. This will be the patient's 10th visit to the emergency department regarding left lower quadrant abdominal pain. CBC unremarkable. CMP noted improvement of sodium from 123 to 134 when compared to 4 days ago. Glucose mildly elevated at 289-negative elevated anion gap, 15 mEq per liter. Mildly elevated creatinine of 1.44 - when compared to previous labs patient's creatinine has been as high as 2.35. Lipase negative elevation. Urinalysis noted trace of leukocytes with mild white blood cell count of 3-6. Urine pregnancy negative. Benign abdominal exam-abdomen soft upon palpation-nonsurgical abdomen noted on exam. Doubt DKA-negative anion gap, negative ketones in urine. Doubt appendicitis. Doubt pancreatitis. Negative findings of pyelonephritis or urinary  tract infection. Discussed case with attending physician who agreed to plan of discharge - did not recommend imaging at this time. Definitive etiology of abdominal pain unknown-patient does have history of chronic abdominal pain. Patient tolerated fluids PO. Patient stable, afebrile. Patient not septic appearing. Discharged patient. Discharged patient with referral to PCP and GI. Discussed with patient to rest and stay hydrated. Discussed with patient to closely monitor symptoms and if symptoms are to worsen or change to report back to the ED - strict return instructions given.  Patient agreed to plan of care, understood, all questions answered.   Jamse Mead, PA-C 11/18/13 Barnum, PA-C 11/19/13 [phone]

## 2013-11-18 NOTE — ED Notes (Signed)
Pt just seen for same, c/o abd pain, loss of appetite. Pt states when she goes to restroom there is a warm feeling in body.

## 2013-11-18 NOTE — ED Notes (Signed)
Pt tolerated ginger ale well--- pt denies nausea at this time.

## 2013-11-19 ENCOUNTER — Encounter: Payer: No Typology Code available for payment source | Admitting: Internal Medicine

## 2013-11-20 LAB — URINE CULTURE
Colony Count: 70000
SPECIAL REQUESTS: NORMAL

## 2013-11-23 NOTE — ED Provider Notes (Signed)
Medical screening examination/treatment/procedure(s) were performed by non-physician practitioner and as supervising physician I was immediately available for consultation/collaboration.   EKG Interpretation None       Babette Relic, MD 11/23/13 2226

## 2013-11-26 ENCOUNTER — Ambulatory Visit (INDEPENDENT_AMBULATORY_CARE_PROVIDER_SITE_OTHER): Payer: No Typology Code available for payment source | Admitting: Internal Medicine

## 2013-11-26 ENCOUNTER — Encounter: Payer: Self-pay | Admitting: Internal Medicine

## 2013-11-26 ENCOUNTER — Ambulatory Visit: Payer: No Typology Code available for payment source | Admitting: Dietician

## 2013-11-26 VITALS — BP 131/74 | HR 82 | Temp 97.5°F | Ht 64.0 in | Wt 162.2 lb

## 2013-11-26 DIAGNOSIS — R32 Unspecified urinary incontinence: Secondary | ICD-10-CM

## 2013-11-26 DIAGNOSIS — N3941 Urge incontinence: Secondary | ICD-10-CM | POA: Insufficient documentation

## 2013-11-26 DIAGNOSIS — N183 Chronic kidney disease, stage 3 unspecified: Secondary | ICD-10-CM

## 2013-11-26 DIAGNOSIS — E0821 Diabetes mellitus due to underlying condition with diabetic nephropathy: Secondary | ICD-10-CM

## 2013-11-26 MED ORDER — SIMVASTATIN 40 MG PO TABS: ORAL_TABLET | ORAL | Status: DC

## 2013-11-26 NOTE — Assessment & Plan Note (Signed)
Pt has been having this for months, ? Duration. Hx suggestive of Urge incontinence. Might also be polyuria related to hyperglycemia. Pt denies any dysuric symptoms, has a 2/10 lower abdominal pain- relieved with tylenol. Abdominal pain Not present when she is voiding but after, when she lies down. Pt drinks a lot of water at night.  Plan- Conservative management for now. Pt advised to reduce water intake at night, non after 9pm, but pt say she will not do this. Also pelvic floor exercise.  - Pt was already referred to gynae, has appointment- 12/03/2013. Might benefit form pessaries. - If all this fails, might consider Antispasmodics- oxybutinin.

## 2013-11-26 NOTE — Assessment & Plan Note (Signed)
Pt did not bring glucometer today. Pt has been taking 30u of insulin-levemir daily, and actos- 15u daily. Daughter said pt had one recording of 73 three weeks ago. Pt called the on call pager 11/13/2013 with reports of low blood sugar- 70s, and was told to reduce insulin to 26u, pt did not do this and has been taking 30u.Pt said readings- 100s - 200s. No readings less than 70 or in the 70s despite 30u of insulin.  Plan- Continue 30units levemir with actos- 15u. - Pt instructed to call with blood sugars less than 70.

## 2013-11-26 NOTE — Progress Notes (Signed)
Patient ID: [name], female   DOB: 02-12-1955, 58 y.o.   MRN: [id number]   Subjective:   Patient ID: [name] female   DOB: 09-27-1955 58 y.o.   MRN: [id number]  HPI: [name] is a 58 y.o. with PMH listed below presented today for follow up of her blood sugars. No new complaints today, except for chronic lower abdominal pain, 2/10, relieved with tylenol. Has had several visits- 12 ED visits over the past month for lower abdominal pain and has been treated with antibiotics. Last Urine culture- 10/12- 70,000 colonies with multiple bacterial morphocytes, non predominant. Pt also endorses urinary incontinence over the past months, cant specify duration, without dysuria. Says she passes urine- several times a day, 3-4 times at night but sometimes she doesn't wake up to pass urine, no straining. No abdominal pain with passing urine. Pt says she has accidents when her bladder is full and then urine starts to leak before she makes it to the bathroom. No passage of urine with coughing or sneezing.      Past Medical History  Diagnosis Date  . Diabetes mellitus type II, uncontrolled   . Hypertension   . Hyperlipidemia   . Ovarian cyst, left   . Anemia     due to menorrhagia, BL 8-10  . Vaginal cyst     nabothian and bartholin  . CKD (chronic kidney disease) stage 3, GFR 30-59 ml/min     baseline creatinine 1.4-1.7  . Anxiety   . Depression   . Postmenopausal bleeding 06/12/2008  . 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 UTI (lower urinary tract infection)   . IBS (irritable bowel syndrome)   . Chronic back pain   . Chronic abdominal pain   . DDD (degenerative disc disease), lumbar   . Bilateral renal cysts 01/15/2009    Qualifier: Diagnosis of  By: Tyrell Antonio MD, [place]     Current Outpatient Prescriptions  Medication Sig Dispense Refill  . acetaminophen (TYLENOL) 325 MG tablet Take 650 mg by mouth every 6 (six) hours as needed for moderate pain.      Marland Kitchen  aspirin EC 81 MG tablet Take 81 mg by mouth daily.      . ciprofloxacin (CIPRO) 500 MG tablet Take 500 mg by mouth 2 (two) times daily.      Marland Kitchen dicyclomine (BENTYL) 20 MG tablet Take 20 mg by mouth 2 (two) times daily.      . diphenhydramine-acetaminophen (TYLENOL PM) 25-500 MG TABS Take 2 tablets by mouth at bedtime as needed.      . enalapril (VASOTEC) 10 MG tablet Take 10 mg by mouth daily.      . famotidine (PEPCID) 40 MG tablet Take 1 tablet (40 mg total) by mouth 2 (two) times daily.  60 tablet  2  . Insulin Detemir (LEVEMIR FLEXTOUCH) 100 UNIT/ML Pen Inject 30 Units into the skin every evening.       . pioglitazone (ACTOS) 15 MG tablet Take 15 mg by mouth daily.      . simvastatin (ZOCOR) 40 MG tablet TAKE 1 TABLET (40 MG TOTAL) BY MOUTH AT BEDTIME.  30 tablet  5  . [DISCONTINUED] pantoprazole (PROTONIX) 20 MG tablet Take 2 tablets (40 mg total) by mouth daily.  30 tablet  1  . [DISCONTINUED] sertraline (ZOLOFT) 100 MG tablet Take 1 tablet (100 mg total) by mouth daily.  30 tablet  2   No current facility-administered medications for this  visit.   Family History  Problem Relation Age of Onset  . Stroke Father   . Heart attack Father     Had MI in his 17s  . Stomach cancer Paternal Grandmother   . Diabetes Maternal Grandmother   . Cerebral palsy Daughter   . Anesthesia problems Neg Hx   . Hypotension Neg Hx   . Malignant hyperthermia Neg Hx   . Pseudochol deficiency Neg Hx    History   Social History  . Marital Status: Divorced    Spouse Name: N/A    Number of Children: 1  . Years of Education: 12th grade   Occupational History  . 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 Smoking status: Never Smoker   . Smokeless tobacco: Former Systems developer  . Alcohol Use: No  . Drug Use: No  . Sexual Activity: None   Other Topics Concern  . 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   Review of Systems: CONSTITUTIONAL-  No Fever, weightloss, night sweat or change in appetite. SKIN- No Rash, colour changes or itching. HEAD- No Headache or dizziness. RESPIRATORY- No Cough or SOB. CARDIAC- No Palpitations, DOE, PND or chest pain. GI- No nausea, vomiting, diarrhoea, constipation, abd pain. URINARY- No Frequency, urgency, straining or dysuria. NEUROLOGIC- No Numbness, syncope, seizures or burning. [facility]- Denies depression or anxiety.  Objective:  Physical Exam: Filed Vitals:   11/26/13 1426  BP: 134/103  Pulse: 88  Temp: 97.5 F (36.4 C)  TempSrc: Oral  Height: 5\' 4"  (1.626 m)  Weight: 162 lb 3.2 oz (73.573 kg)  SpO2: 100%   GENERAL- alert, co-operative, appears as stated age, not in any distress. HEENT- Atraumatic, normocephalic, PERRL, EOMI, oral mucosa appears moist, neck supple. CARDIAC- RRR, no murmurs, rubs or gallops. RESP- Moving equal volumes of air, and clear to auscultation bilaterally, no wheezes or crackles. ABDOMEN- Soft, nontender, no guarding or rebound, no palpable masses or organomegaly, bowel sounds present. BACK- Normal curvature of the spine, No tenderness along the vertebrae, no CVA tenderness. NEURO- No obvious Cr N abnormality, strenght upper and lower extremities- 5/5, Gait- Normal. EXTREMITIES- pulse 2+, symmetric, no pedal edema. SKIN- Warm, dry, No rash or lesion. PSYCH- Normal mood and affect, appropriate thought content and speech, but voluminous speech.  Assessment & Plan:  The patient's case and plan of care was discussed with attending physician, Dr.   Please see problem based charting for assessment and plan.

## 2013-11-26 NOTE — Assessment & Plan Note (Signed)
GFR- Improved to 40s- 11/18/2013, from 20s. Cr- 1.44- 11/18/2013 at pts baseline. If GFR persistent in the 40s or , at next visit, consider nephrology referral.

## 2013-11-26 NOTE — Assessment & Plan Note (Signed)
BP Readings from Last 3 Encounters:  11/26/13 131/74  11/18/13 138/79  11/14/13 143/73    Lab Results  Component Value Date   NA 134* 11/18/2013   K 4.2 11/18/2013   CREATININE 1.44* 11/18/2013    Assessment: Blood pressure control:  Controlled Progress toward BP goal:   At goal Comments: On enalapril only- 10mg  daily  Plan: Medications:  continue current medications Educational resources provided:   Self management tools provided:   Other plans: Bmet next visit.

## 2013-11-26 NOTE — Patient Instructions (Addendum)
General Instructions:  Urinary Incontinence Urinary incontinence is the involuntary loss of urine from your bladder. CAUSES  There are many causes of urinary incontinence. They include:  Medicines.  Infections.  Prostatic enlargement, leading to overflow of urine from your bladder.  Surgery.  Neurological diseases.  Emotional factors. SIGNS AND SYMPTOMS Urinary Incontinence can be divided into four types: 1. Urge incontinence. Urge incontinence is the involuntary loss of urine before you have the opportunity to go to the bathroom. There is a sudden urge to void but not enough time to reach a bathroom. 2. Stress incontinence. Stress incontinence is the sudden loss of urine with any activity that forces urine to pass. It is commonly caused by anatomical changes to the pelvis and sphincter areas of your body. 3. Overflow incontinence. Overflow incontinence is the loss of urine from an obstructed opening to your bladder. This results in a backup of urine and a resultant buildup of pressure within the bladder. When the pressure within the bladder exceeds the closing pressure of the sphincter, the urine overflows, which causes incontinence, similar to water overflowing a dam. 4. Total incontinence. Total incontinence is the loss of urine as a result of the inability to store urine within your bladder. DIAGNOSIS  Evaluating the cause of incontinence may require:  A thorough and complete medical and obstetric history.  A complete physical exam.  Laboratory tests such as a urine culture and sensitivities. When additional tests are indicated, they can include:  An ultrasound exam.  Kidney and bladder X-rays.  Cystoscopy. This is an exam of the bladder using a narrow scope.  Urodynamic testing to test the nerve function to the bladder and sphincter areas. TREATMENT  Treatment for urinary incontinence depends on the cause:  For urge incontinence caused by a bacterial infection,  antibiotics will be prescribed. If the urge incontinence is related to medicines you take, your health care provider may have you change the medicine.  For stress incontinence, surgery to re-establish anatomical support to the bladder or sphincter, or both, will often correct the condition.  For overflow incontinence caused by an enlarged prostate, an operation to open the channel through the enlarged prostate will allow the flow of urine out of the bladder. In women with fibroids, a hysterectomy may be recommended.  For total incontinence, surgery on your urinary sphincter may help. An artificial urinary sphincter (an inflatable cuff placed around the urethra) may be required. In women who have developed a hole-like passage between their bladder and vagina (vesicovaginal fistula), surgery to close the fistula often is required. HOME CARE INSTRUCTIONS  Normal daily hygiene and the use of pads or adult diapers that are changed regularly will help prevent odors and skin damage.  Avoid caffeine. It can overstimulate your bladder.  Use the bathroom regularly. Try about every 2-3 hours to go to the bathroom, even if you do not feel the need to do so. Take time to empty your bladder completely. After urinating, wait a minute. Then try to urinate again.  For causes involving nerve dysfunction, keep a log of the medicines you take and a journal of the times you go to the bathroom. SEEK MEDICAL CARE IF:  You experience worsening of pain instead of improvement in pain after your procedure.  Your incontinence becomes worse instead of better. SEE IMMEDIATE MEDICAL CARE IF:  You experience fever or shaking chills.  You are unable to pass your urine.  You have redness spreading into your groin or down into your  thighs. MAKE SURE YOU:   Understand these instructions.   Will watch your condition.  Will get help right away if you are not doing well or get worse.   Please bring your medicines  with you each time you come to clinic.  Medicines may include prescription medications, over-the-counter medications, herbal remedies, eye drops, vitamins, or other pills.   Reduce the amount of water you drink at night. Do not drink water after 9pm. Continue with your medications as you are taking them for now.  Please bring your glucometer at your next visit.

## 2013-11-27 NOTE — Progress Notes (Signed)
Case discussed with Dr. Denton Brick soon after the resident saw the patient. We reviewed the resident's history and exam and pertinent patient test results. I agree with the assessment, diagnosis, and plan of care documented in the resident's note.  For the Stage III chronic kidney disease we will aggressively manage risk factors including hypertension and hyperlipidemia.

## 2013-11-27 NOTE — Progress Notes (Signed)
Opened in error

## 2013-11-28 ENCOUNTER — Ambulatory Visit (INDEPENDENT_AMBULATORY_CARE_PROVIDER_SITE_OTHER): Payer: No Typology Code available for payment source | Admitting: Obstetrics & Gynecology

## 2013-11-28 ENCOUNTER — Encounter: Payer: Self-pay | Admitting: Obstetrics & Gynecology

## 2013-11-28 VITALS — BP 142/74 | HR 90 | Temp 98.1°F | Ht 64.0 in | Wt 161.7 lb

## 2013-11-28 DIAGNOSIS — N39498 Other specified urinary incontinence: Secondary | ICD-10-CM

## 2013-11-28 DIAGNOSIS — Z124 Encounter for screening for malignant neoplasm of cervix: Secondary | ICD-10-CM

## 2013-11-28 DIAGNOSIS — Z1151 Encounter for screening for human papillomavirus (HPV): Secondary | ICD-10-CM

## 2013-11-28 NOTE — Progress Notes (Signed)
CLINIC ENCOUNTER NOTE  History:  58 y.o. [name] with  here today for pap smear screening.  Also reports continued nighttime incontinence, not caused by sneezing, Valsalva.  She has been told by her PCP that this is 2/2 to diabetes, likely has neurogenic bladder.  Was told to follow up with [facility] but was not able to do due to insurance issues. Patient reports having multiple problems but she is not able to be seen by various providers secondary to lack of insurance. She is very emotional about this.  Today, she just wants a pap smear. She had a normal mammogram on 10/30/13, declines breast examination. No other GYN concerns.  Past Medical History  Diagnosis Date  . Diabetes mellitus type II, uncontrolled   . Hypertension   . Hyperlipidemia   . Ovarian cyst, left   . Anemia     due to menorrhagia, BL 8-10  . Vaginal cyst     nabothian and bartholin  . CKD (chronic kidney disease) stage 3, GFR 30-59 ml/min     baseline creatinine 1.4-1.7  . Anxiety   . Depression   . Postmenopausal bleeding 06/12/2008  . 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 UTI (lower urinary tract infection)   . IBS (irritable bowel syndrome)   . Chronic back pain   . Chronic abdominal pain   . DDD (degenerative disc disease), lumbar   . Bilateral renal cysts 01/15/2009    Qualifier: Diagnosis of  By: Tyrell Antonio MD, Bel[place]      The following portions of the patient's history were reviewed and updated as appropriate: allergies, current medications, past family history, past medical history, past social history, past surgical history and problem list.    Review of Systems:  Pertinent items are noted in HPI.  Objective:  Physical Exam BP 142/74  Pulse 90  Temp(Src) 98.1 F (36.7 C)  Ht 5\' 4"  (1.626 m)  Wt 161 lb 11.2 oz (73.347 kg)  BMI 27.74 kg/m2 Gen: NAD Abd: Soft, nontender and nondistended Pelvic: Normal appearing external genitalia with moderate atrophy; atrophic  vaginal mucosa and cervix. No cystocele or rectocele noted, overall good pelvic organ support. Stenotic cervix noted, unable to pass endocervical brush. Pap smear done.  Scant clear discharge.  Small uterus, no other palpable masses, no uterine or adnexal tenderness.  Labs and Imaging 11/12/2013   TRANSABDOMINAL AND TRANSVAGINAL ULTRASOUND OF PELVIS CLINICAL DATA:  Left lower abdominal/ pelvic pain    TECHNIQUE: Study was performed transabdominally to optimize pelvic field of view evaluation and transvaginally to optimize internal visceral architecture evaluation.  COMPARISON:  CT abdomen and pelvis October 19, 2013  FINDINGS: Uterus  Measurements: 7.0 x 3.8 x 6.6 cm. No fibroids or other mass visualized.  Endometrium  Thickness: 2 mm.  No focal abnormality visualized.  Right ovary  Not seen by either transabdominal or transvaginal technique.  Left ovary  Measurements: 1.3 x 1.1 x 1.5 cm. Normal appearance/no adnexal mass.  Other findings  No free fluid.  IMPRESSION: Right ovary not visualize, probably due to postmenopausal state. Left ovary small but normal in appearance. No pelvic mass or fluid.   Electronically Signed   By: Lowella Grip M.D.   On: 11/12/2013 09:20    Assessment & Plan:  Will follow up pap smear and manage accordingly Routine preventative health maintenance measures emphasized.   Verita Schneiders, MD, [place] Attending [place] for [employer], [place]

## 2013-11-28 NOTE — Progress Notes (Signed)
Referred her for pap screen. Also c/o problems with bladder.

## 2013-11-28 NOTE — Patient Instructions (Signed)
Return to clinic for any scheduled appointments or for any gynecologic concerns as needed.   

## 2013-12-01 ENCOUNTER — Encounter (HOSPITAL_COMMUNITY): Payer: Self-pay | Admitting: Emergency Medicine

## 2013-12-01 ENCOUNTER — Emergency Department (INDEPENDENT_AMBULATORY_CARE_PROVIDER_SITE_OTHER)
Admission: EM | Admit: 2013-12-01 | Discharge: 2013-12-01 | Disposition: A | Discharge status: Home / Self Care | Payer: No Typology Code available for payment source | Source: Home / Self Care

## 2013-12-01 DIAGNOSIS — N390 Urinary tract infection, site not specified: Secondary | ICD-10-CM

## 2013-12-01 LAB — POCT URINALYSIS DIP (DEVICE)
BILIRUBIN URINE: NEGATIVE
Glucose, UA: NEGATIVE mg/dL
HGB URINE DIPSTICK: NEGATIVE
KETONES UR: NEGATIVE mg/dL
Nitrite: NEGATIVE
Protein, ur: NEGATIVE mg/dL
Specific Gravity, Urine: 1.01 (ref 1.005–1.030)
Urobilinogen, UA: 0.2 mg/dL (ref 0.0–1.0)
pH: 5.5 (ref 5.0–8.0)

## 2013-12-01 MED ORDER — CEPHALEXIN 500 MG PO CAPS: 500.0000 mg | ORAL_CAPSULE | Freq: Four times a day (QID) | ORAL | Status: DC

## 2013-12-01 NOTE — ED Notes (Signed)
C/o uti symptoms.  States "body dont feel well"  "I think I have a uti.  Dysuria.

## 2013-12-01 NOTE — Discharge Instructions (Signed)
Take all of medicine as directed, drink lots of fluids, see your doctor if further problems. °

## 2013-12-01 NOTE — ED Provider Notes (Signed)
CSN: [id number]     Arrival date & time 12/01/13  1322 History   None    Chief Complaint  Patient presents with  . Urinary Tract Infection   (Consider location/radiation/quality/duration/timing/severity/associated sxs/prior Treatment) Patient is a 58 y.o. female presenting with urinary tract infection. The history is provided by the patient.  Urinary Tract Infection This is a new problem. The current episode started more than 2 days ago (pt concerned about sx for uti, uncertain what hosp said re sx, wants a urologist.). The problem has not changed since onset.   Past Medical History  Diagnosis Date  . Diabetes mellitus type II, uncontrolled   . Hypertension   . Hyperlipidemia   . Ovarian cyst, left   . Anemia     due to menorrhagia, BL 8-10  . Vaginal cyst     nabothian and bartholin  . CKD (chronic kidney disease) stage 3, GFR 30-59 ml/min     baseline creatinine 1.4-1.7  . Anxiety   . Depression   . Postmenopausal bleeding 06/12/2008  . 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 UTI (lower urinary tract infection)   . IBS (irritable bowel syndrome)   . Chronic back pain   . Chronic abdominal pain   . DDD (degenerative disc disease), lumbar   . Bilateral renal cysts 01/15/2009    Qualifier: Diagnosis of  By: Tyrell Antonio MD, Jerald Kief     Past Surgical History  Procedure Laterality Date  . Cardiac surgery      to repair congenital defect as a child   Family History  Problem Relation Age of Onset  . Stroke Father   . Heart attack Father     Had MI in his 55s  . Stomach cancer Paternal Grandmother   . Diabetes Maternal Grandmother   . Cerebral palsy Daughter   . Anesthesia problems Neg Hx   . Hypotension Neg Hx   . Malignant hyperthermia Neg Hx   . Pseudochol deficiency Neg Hx    History  Substance Use Topics  . Smoking status: Never Smoker   . Smokeless tobacco: Former Systems developer  . Alcohol Use: No   OB History   Grav Para Term Preterm Abortions TAB  SAB Ect Mult Living   3 1 1  2  2   1      Review of Systems  Constitutional: Negative.   Gastrointestinal: Negative.   Genitourinary: Positive for dysuria, urgency and frequency.    Allergies  Review of patient's allergies indicates no known allergies.  Home Medications   Prior to Admission medications   Medication Sig Start Date End Date Taking? Authorizing Provider  Insulin Detemir (LEVEMIR FLEXTOUCH) 100 UNIT/ML Pen Inject 30 Units into the skin every evening.    Yes Historical Provider, MD  pioglitazone (ACTOS) 15 MG tablet Take 15 mg by mouth daily.   Yes Historical Provider, MD  simvastatin (ZOCOR) 40 MG tablet TAKE 1 TABLET (40 MG TOTAL) BY MOUTH AT BEDTIME. 11/26/13  Yes [name], MD  acetaminophen (TYLENOL) 325 MG tablet Take 650 mg by mouth every 6 (six) hours as needed for moderate pain.    Historical Provider, MD  aspirin EC 81 MG tablet Take 81 mg by mouth daily.    Historical Provider, MD  cephALEXin (KEFLEX) 500 MG capsule Take 1 capsule (500 mg total) by mouth 4 (four) times daily. Take all of medicine and drink lots of fluids 12/01/13   Billy Fischer, MD  dicyclomine (BENTYL) 20 MG tablet Take 20 mg by mouth 2 (two) times daily.    Historical Provider, MD  diphenhydramine-acetaminophen (TYLENOL PM) 25-500 MG TABS Take 2 tablets by mouth at bedtime as needed.    Historical Provider, MD  enalapril (VASOTEC) 10 MG tablet Take 10 mg by mouth daily.    Historical Provider, MD  famotidine (PEPCID) 40 MG tablet Take 1 tablet (40 mg total) by mouth 2 (two) times daily. 11/14/13   Clinton Gallant, MD   BP 150/81  Pulse 93  Temp(Src) 98.2 F (36.8 C) (Oral)  Resp 16  SpO2 98% Physical Exam  Nursing note and vitals reviewed. Constitutional: She is oriented to person, place, and time. She appears well-developed and well-nourished.  Neck: Normal range of motion. Neck supple.  Abdominal: Soft. Bowel sounds are normal.  Neurological: She is alert and oriented to person,  place, and time.  Skin: Skin is warm and dry.    ED Course  Procedures (including critical care time) Labs Review Labs Reviewed  POCT URINALYSIS DIP (DEVICE) - Abnormal; Notable for the following:    Leukocytes, UA MODERATE (*)    All other components within normal limits  URINE CULTURE    Imaging Review No results found.   MDM   1. UTI (lower urinary tract infection)        Billy Fischer, MD 12/01/13 1409

## 2013-12-03 LAB — URINE CULTURE: Colony Count: 25000

## 2013-12-03 LAB — CYTOLOGY - PAP

## 2013-12-05 ENCOUNTER — Emergency Department (INDEPENDENT_AMBULATORY_CARE_PROVIDER_SITE_OTHER)
Admission: EM | Admit: 2013-12-05 | Discharge: 2013-12-05 | Disposition: A | Discharge status: Home / Self Care | Payer: No Typology Code available for payment source | Source: Home / Self Care | Attending: Family Medicine | Admitting: Family Medicine

## 2013-12-05 ENCOUNTER — Encounter (HOSPITAL_COMMUNITY): Payer: Self-pay | Admitting: Emergency Medicine

## 2013-12-05 ENCOUNTER — Telehealth: Payer: Self-pay | Admitting: *Deleted

## 2013-12-05 ENCOUNTER — Inpatient Hospital Stay (HOSPITAL_COMMUNITY)
Admission: AD | Admit: 2013-12-05 | Discharge: 2013-12-05 | Disposition: A | Discharge status: Home / Self Care | Payer: No Typology Code available for payment source | Source: Ambulatory Visit | Attending: Obstetrics & Gynecology | Admitting: Obstetrics & Gynecology

## 2013-12-05 ENCOUNTER — Encounter (HOSPITAL_COMMUNITY): Payer: Self-pay | Admitting: General Practice

## 2013-12-05 DIAGNOSIS — N39498 Other specified urinary incontinence: Secondary | ICD-10-CM

## 2013-12-05 DIAGNOSIS — R32 Unspecified urinary incontinence: Secondary | ICD-10-CM | POA: Insufficient documentation

## 2013-12-05 LAB — URINALYSIS, ROUTINE W REFLEX MICROSCOPIC
BILIRUBIN URINE: NEGATIVE
GLUCOSE, UA: 250 mg/dL — AB
Hgb urine dipstick: NEGATIVE
KETONES UR: NEGATIVE mg/dL
Nitrite: NEGATIVE
Protein, ur: NEGATIVE mg/dL
SPECIFIC GRAVITY, URINE: 1.01 (ref 1.005–1.030)
Urobilinogen, UA: 0.2 mg/dL (ref 0.0–1.0)
pH: 6 (ref 5.0–8.0)

## 2013-12-05 LAB — URINE MICROSCOPIC-ADD ON

## 2013-12-05 LAB — POCT I-STAT, CHEM 8
BUN: 28 mg/dL — ABNORMAL HIGH (ref 6–23)
Calcium, Ion: 1.13 mmol/L (ref 1.12–1.23)
Chloride: 100 mEq/L (ref 96–112)
Creatinine, Ser: 1.5 mg/dL — ABNORMAL HIGH (ref 0.50–1.10)
Glucose, Bld: 213 mg/dL — ABNORMAL HIGH (ref 70–99)
HEMATOCRIT: 42 % (ref 36.0–46.0)
HEMOGLOBIN: 14.3 g/dL (ref 12.0–15.0)
Potassium: 5.2 mEq/L (ref 3.7–5.3)
Sodium: 134 mEq/L — ABNORMAL LOW (ref 137–147)
TCO2: 24 mmol/L (ref 0–100)

## 2013-12-05 LAB — WET PREP, GENITAL
Clue Cells Wet Prep HPF POC: NONE SEEN
Trich, Wet Prep: NONE SEEN
YEAST WET PREP: NONE SEEN

## 2013-12-05 NOTE — ED Provider Notes (Signed)
[name] is a 58 y.o. female who presents to Urgent Care today for discolored urine. Patient has a long history of pelvic discomfort associated with urinary frequency. Her symptoms have not changed in any way. However she notes this morning her urine was discolored. She is concerned that she has another urinary tract infection. She is currently taking Keflex from a previous possible urinary tract infection with negative urine culture results. No fevers or chills vomiting or diarrhea. She has an appointment with her primary care doctor tomorrow.    Past Medical History  Diagnosis Date  . Diabetes mellitus type II, uncontrolled   . Hypertension   . Hyperlipidemia   . Ovarian cyst, left   . Anemia     due to menorrhagia, BL 8-10  . Vaginal cyst     nabothian and bartholin  . CKD (chronic kidney disease) stage 3, GFR 30-59 ml/min     baseline creatinine 1.4-1.7  . Anxiety   . Depression   . Postmenopausal bleeding 06/12/2008  . 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 UTI (lower urinary tract infection)   . IBS (irritable bowel syndrome)   . Chronic back pain   . Chronic abdominal pain   . DDD (degenerative disc disease), lumbar   . Bilateral renal cysts 01/15/2009    Qualifier: Diagnosis of  By: Tyrell Antonio MD, [place]     History  Substance Use Topics  . Smoking status: Never Smoker   . Smokeless tobacco: Former Systems developer  . Alcohol Use: No   ROS as above Medications: No current facility-administered medications for this encounter.   Current Outpatient Prescriptions  Medication Sig Dispense Refill  . acetaminophen (TYLENOL) 325 MG tablet Take 650 mg by mouth every 6 (six) hours as needed for moderate pain.      Marland Kitchen aspirin EC 81 MG tablet Take 81 mg by mouth daily.      . cephALEXin (KEFLEX) 500 MG capsule Take 1 capsule (500 mg total) by mouth 4 (four) times daily. Take all of medicine and drink lots of fluids  20 capsule  0  . dicyclomine (BENTYL) 20 MG tablet  Take 20 mg by mouth 2 (two) times daily.      . diphenhydramine-acetaminophen (TYLENOL PM) 25-500 MG TABS Take 2 tablets by mouth at bedtime as needed.      . enalapril (VASOTEC) 10 MG tablet Take 10 mg by mouth daily.      . famotidine (PEPCID) 40 MG tablet Take 1 tablet (40 mg total) by mouth 2 (two) times daily.  60 tablet  2  . Insulin Detemir (LEVEMIR FLEXTOUCH) 100 UNIT/ML Pen Inject 30 Units into the skin every evening.       . pioglitazone (ACTOS) 15 MG tablet Take 15 mg by mouth daily.      . simvastatin (ZOCOR) 40 MG tablet TAKE 1 TABLET (40 MG TOTAL) BY MOUTH AT BEDTIME.  30 tablet  5  . [DISCONTINUED] pantoprazole (PROTONIX) 20 MG tablet Take 2 tablets (40 mg total) by mouth daily.  30 tablet  1  . [DISCONTINUED] sertraline (ZOLOFT) 100 MG tablet Take 1 tablet (100 mg total) by mouth daily.  30 tablet  2    Exam:  BP 145/92  Pulse 106  Temp(Src) 98.2 F (36.8 C) (Oral)  Resp 16  SpO2 98% Gen: Well NAD HEENT: EOMI,  MMM Lungs: Normal work of breathing. CTABL Heart: RRR no MRG Abd: NABS, Soft. Nondistended, Nontender no CVA  angle tenderness to percussion Exts: Brisk capillary refill, warm and well perfused.   Urinalysis significant for small leukocyte esterase otherwise normal.  Results for orders placed during the hospital encounter of 12/05/13 (from the past 24 hour(s))  POCT I-STAT, CHEM 8     Status: Abnormal   Collection Time    12/05/13 11:26 AM      Result Value Ref Range   Sodium 134 (*) 137 - 147 mEq/L   Potassium 5.2  3.7 - 5.3 mEq/L   Chloride 100  96 - 112 mEq/L   BUN 28 (*) 6 - 23 mg/dL   Creatinine, Ser 1.50 (*) 0.50 - 1.10 mg/dL   Glucose, Bld 213 (*) 70 - 99 mg/dL   Calcium, Ion 1.13  1.12 - 1.23 mmol/L   TCO2 24  0 - 100 mmol/L   Hemoglobin 14.3  12.0 - 15.0 g/dL   HCT 42.0  36.0 - 46.0 %   No results found.  Assessment and Plan: 58 y.o. female with urinary frequency and incontinence. Doubtful for urinary tract infection as patient is currently  taking Keflex. Urine culture pending. Follow-up with primary care provider. Will call patient with results as needed.  Discussed warning signs or symptoms. Please see discharge instructions. Patient expresses understanding.     [name], MD 12/05/13 [phone]

## 2013-12-05 NOTE — Telephone Encounter (Signed)
Pt calls c/o "urine looking very bad today", she states it has pus in it and is very dark. She is at the ED parking lot but does not want to be seen there. She might go to [facility] she states. She states she is not going to keep her gi appt because she knows she does not have a gi problem it is a urinary problem. She is scheduled an appt tomorrow and states she will go to [facility]

## 2013-12-05 NOTE — MAU Note (Signed)
Pt reports having some urine that is leaking. Stated urine that was leaking was gray/brown color. Went to  urgent care and they told her everything was normal.

## 2013-12-05 NOTE — MAU Provider Note (Signed)
History     CSN: [id number]  Arrival date and time: 12/05/13 3335   First Provider Initiated Contact with Patient 12/05/13 1706      Chief Complaint  Patient presents with  . Dysuria   HPI Comments: [name] 58 y.o. [id number] presents to MAU with leaking urine or discolored urine. She was seen at Urgent care today and on Oct 25th and evaluated both times for the same complaint. She had a negative urine culture on the 25th but remains on Keflex. She has an appointment with PCP tomorrow and has made an appointment with Urology.   Dysuria       Past Medical History  Diagnosis Date  . Diabetes mellitus type II, uncontrolled   . Hypertension   . Hyperlipidemia   . Ovarian cyst, left   . Anemia     due to menorrhagia, BL 8-10  . Vaginal cyst     nabothian and bartholin  . CKD (chronic kidney disease) stage 3, GFR 30-59 ml/min     baseline creatinine 1.4-1.7  . Anxiety   . Depression   . Postmenopausal bleeding 06/12/2008  . 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 UTI (lower urinary tract infection)   . IBS (irritable bowel syndrome)   . Chronic back pain   . Chronic abdominal pain   . DDD (degenerative disc disease), lumbar   . Bilateral renal cysts 01/15/2009    Qualifier: Diagnosis of  By: Tyrell Antonio MD, Jerald Kief      Past Surgical History  Procedure Laterality Date  . Cardiac surgery      to repair congenital defect as a child    Family History  Problem Relation Age of Onset  . Stroke Father   . Heart attack Father     Had MI in his 49s  . Stomach cancer Paternal Grandmother   . Diabetes Maternal Grandmother   . Cerebral palsy Daughter   . Anesthesia problems Neg Hx   . Hypotension Neg Hx   . Malignant hyperthermia Neg Hx   . Pseudochol deficiency Neg Hx     History  Substance Use Topics  . Smoking status: Never Smoker   . Smokeless tobacco: Former Systems developer  . Alcohol Use: No    Allergies: No Known Allergies  Prescriptions  prior to admission  Medication Sig Dispense Refill  . acetaminophen (TYLENOL) 325 MG tablet Take 650 mg by mouth every 6 (six) hours as needed for moderate pain.      Marland Kitchen aspirin EC 81 MG tablet Take 81 mg by mouth daily.      . cephALEXin (KEFLEX) 500 MG capsule Take 1 capsule (500 mg total) by mouth 4 (four) times daily. Take all of medicine and drink lots of fluids  20 capsule  0  . dicyclomine (BENTYL) 20 MG tablet Take 20 mg by mouth 2 (two) times daily.      . diphenhydramine-acetaminophen (TYLENOL PM) 25-500 MG TABS Take 2 tablets by mouth at bedtime as needed.      . enalapril (VASOTEC) 10 MG tablet Take 10 mg by mouth daily.      . Insulin Detemir (LEVEMIR FLEXTOUCH) 100 UNIT/ML Pen Inject 30 Units into the skin every evening.       . pioglitazone (ACTOS) 15 MG tablet Take 15 mg by mouth daily.      . simvastatin (ZOCOR) 40 MG tablet TAKE 1 TABLET (40 MG TOTAL) BY MOUTH AT BEDTIME.  30 tablet  5    Review of Systems  Constitutional: Negative.   HENT: Negative.   Eyes: Negative.   Respiratory: Negative.   Cardiovascular: Negative.   Gastrointestinal: Negative.   Genitourinary: Positive for dysuria.  Musculoskeletal: Negative.   Skin: Negative.   Neurological: Negative.   Psychiatric/Behavioral: Negative.    Physical Exam   Blood pressure 155/87, pulse 97, temperature 98.9 F (37.2 C), resp. rate 18, height 5\' 4"  (1.626 m), weight 72.394 kg (159 lb 9.6 oz).  Physical Exam  Constitutional: She is oriented to person, place, and time. She appears well-developed and well-nourished. No distress.  HENT:  Head: Normocephalic and atraumatic.  Eyes: Pupils are equal, round, and reactive to light.  GI: Soft. She exhibits no distension. There is no tenderness. There is no rebound and no guarding.  Genitourinary:  Genital: external negative Vaginal: cysts/ small amount white discharge Cervix:negative Bimanual: Negative   Musculoskeletal: Normal range of motion.  Neurological: She  is alert and oriented to person, place, and time.  Skin: Skin is warm and dry.  Psychiatric: She has a normal mood and affect. Her behavior is normal. Judgment and thought content normal.   Results for orders placed during the hospital encounter of 12/05/13 (from the past 24 hour(s))  URINALYSIS, ROUTINE W REFLEX MICROSCOPIC     Status: Abnormal   Collection Time    12/05/13  4:20 PM      Result Value Ref Range   Color, Urine YELLOW  YELLOW   APPearance CLEAR  CLEAR   Specific Gravity, Urine 1.010  1.005 - 1.030   pH 6.0  5.0 - 8.0   Glucose, UA 250 (*) NEGATIVE mg/dL   Hgb urine dipstick NEGATIVE  NEGATIVE   Bilirubin Urine NEGATIVE  NEGATIVE   Ketones, ur NEGATIVE  NEGATIVE mg/dL   Protein, ur NEGATIVE  NEGATIVE mg/dL   Urobilinogen, UA 0.2  0.0 - 1.0 mg/dL   Nitrite NEGATIVE  NEGATIVE   Leukocytes, UA MODERATE (*) NEGATIVE  URINE MICROSCOPIC-ADD ON     Status: None   Collection Time    12/05/13  4:20 PM      Result Value Ref Range   Squamous Epithelial / LPF RARE  RARE   WBC, UA 7-10  <3 WBC/hpf   Bacteria, UA RARE  RARE  WET PREP, GENITAL     Status: Abnormal   Collection Time    12/05/13  5:20 PM      Result Value Ref Range   Yeast Wet Prep HPF POC NONE SEEN  NONE SEEN   Trich, Wet Prep NONE SEEN  NONE SEEN   Clue Cells Wet Prep HPF POC NONE SEEN  NONE SEEN   WBC, Wet Prep HPF POC MODERATE (*) NONE SEEN  . She has a urine culture pending MAU Course  Procedures  MDM  Wet prep  Assessment and Plan   A: Urinary leakage  P: Advised to keep appointment with Urology and follow with culture that has already been done  Georgia Duff 12/05/2013, 6:23 PM

## 2013-12-05 NOTE — Discharge Instructions (Signed)
Urinary Incontinence Urinary incontinence is the involuntary loss of urine from your bladder. CAUSES  There are many causes of urinary incontinence. They include:  Medicines.  Infections.  Prostatic enlargement, leading to overflow of urine from your bladder.  Surgery.  Neurological diseases.  Emotional factors. SIGNS AND SYMPTOMS Urinary Incontinence can be divided into four types: 1. Urge incontinence. Urge incontinence is the involuntary loss of urine before you have the opportunity to go to the bathroom. There is a sudden urge to void but not enough time to reach a bathroom. 2. Stress incontinence. Stress incontinence is the sudden loss of urine with any activity that forces urine to pass. It is commonly caused by anatomical changes to the pelvis and sphincter areas of your body. 3. Overflow incontinence. Overflow incontinence is the loss of urine from an obstructed opening to your bladder. This results in a backup of urine and a resultant buildup of pressure within the bladder. When the pressure within the bladder exceeds the closing pressure of the sphincter, the urine overflows, which causes incontinence, similar to water overflowing a dam. 4. Total incontinence. Total incontinence is the loss of urine as a result of the inability to store urine within your bladder. DIAGNOSIS  Evaluating the cause of incontinence may require:  A thorough and complete medical and obstetric history.  A complete physical exam.  Laboratory tests such as a urine culture and sensitivities. When additional tests are indicated, they can include:  An ultrasound exam.  Kidney and bladder X-rays.  Cystoscopy. This is an exam of the bladder using a narrow scope.  Urodynamic testing to test the nerve function to the bladder and sphincter areas. TREATMENT  Treatment for urinary incontinence depends on the cause:  For urge incontinence caused by a bacterial infection, antibiotics will be prescribed.  If the urge incontinence is related to medicines you take, your health care provider may have you change the medicine.  For stress incontinence, surgery to re-establish anatomical support to the bladder or sphincter, or both, will often correct the condition.  For overflow incontinence caused by an enlarged prostate, an operation to open the channel through the enlarged prostate will allow the flow of urine out of the bladder. In women with fibroids, a hysterectomy may be recommended.  For total incontinence, surgery on your urinary sphincter may help. An artificial urinary sphincter (an inflatable cuff placed around the urethra) may be required. In women who have developed a hole-like passage between their bladder and vagina (vesicovaginal fistula), surgery to close the fistula often is required. HOME CARE INSTRUCTIONS  Normal daily hygiene and the use of pads or adult diapers that are changed regularly will help prevent odors and skin damage.  Avoid caffeine. It can overstimulate your bladder.  Use the bathroom regularly. Try about every 2-3 hours to go to the bathroom, even if you do not feel the need to do so. Take time to empty your bladder completely. After urinating, wait a minute. Then try to urinate again.  For causes involving nerve dysfunction, keep a log of the medicines you take and a journal of the times you go to the bathroom. SEEK MEDICAL CARE IF:  You experience worsening of pain instead of improvement in pain after your procedure.  Your incontinence becomes worse instead of better. SEE IMMEDIATE MEDICAL CARE IF:  You experience fever or shaking chills.  You are unable to pass your urine.  You have redness spreading into your groin or down into your thighs. MAKE SURE   YOU:   Understand these instructions.   Will watch your condition.  Will get help right away if you are not doing well or get worse. Document Released: 03/03/2004 Document Revised: 11/14/2012 Document  Reviewed: 07/03/2012 Exit[employer] Patient Information 2015 [employer]. This information is not intended to replace advice given to you by your health care provider. Make sure you discuss any questions you have with your health care provider.  

## 2013-12-05 NOTE — Telephone Encounter (Signed)
Patient called requesting pap results. Patient informed of results.

## 2013-12-05 NOTE — ED Notes (Signed)
Pt states that she had pain last night in lower abdomen painful urination. Pt not in any acute distress at this time

## 2013-12-05 NOTE — Discharge Instructions (Signed)
Thank you for coming in today. We will do a urine culture to know exactly what is going on today. The result will come back in several days. Continue antibiotics. If you get worse please follow-up with your primary care doctor.  If your belly pain worsens, or you have high fever, bad vomiting, blood in your stool or black tarry stool go to the Emergency Room.

## 2013-12-05 NOTE — [facility] Provider Note (Signed)
Attestation of Attending Supervision of Advanced Practitioner (CNM/NP): Evaluation and management procedures were performed by the Advanced Practitioner under my supervision and collaboration.  I have reviewed the Advanced Practitioner's note and chart, and I agree with the management and plan.  HARRAWAY-SMITH, Zamiyah Resendes 7:35 PM

## 2013-12-06 ENCOUNTER — Ambulatory Visit (INDEPENDENT_AMBULATORY_CARE_PROVIDER_SITE_OTHER): Payer: No Typology Code available for payment source | Admitting: Internal Medicine

## 2013-12-06 ENCOUNTER — Encounter: Payer: Self-pay | Admitting: Internal Medicine

## 2013-12-06 VITALS — BP 146/73 | HR 98 | Temp 98.5°F

## 2013-12-06 DIAGNOSIS — N3941 Urge incontinence: Secondary | ICD-10-CM

## 2013-12-06 DIAGNOSIS — I10 Essential (primary) hypertension: Secondary | ICD-10-CM

## 2013-12-06 LAB — POCT URINALYSIS DIP (DEVICE)
Bilirubin Urine: NEGATIVE
Glucose, UA: NEGATIVE mg/dL
Hgb urine dipstick: NEGATIVE
Ketones, ur: NEGATIVE mg/dL
Nitrite: NEGATIVE
PROTEIN: NEGATIVE mg/dL
Specific Gravity, Urine: 1.01 (ref 1.005–1.030)
UROBILINOGEN UA: 0.2 mg/dL (ref 0.0–1.0)
pH: 5.5 (ref 5.0–8.0)

## 2013-12-06 LAB — URINE CULTURE: Special Requests: NORMAL

## 2013-12-06 MED ORDER — OXYBUTYNIN CHLORIDE 5 MG PO TABS: 5.0000 mg | ORAL_TABLET | Freq: Two times a day (BID) | ORAL | Status: DC

## 2013-12-06 NOTE — Patient Instructions (Addendum)
-  Your bladder scan did not show urine retention. Start taking oxybutynin 5 mg twice a day for your urinary symptoms -Finish taking keflex that was prescribed  -Pleasure meeting you!  General Instructions:   Thank you for bringing your medicines today. This helps [place] keep you safe from mistakes.   Progress Toward Treatment Goals:  Treatment Goal 01/25/2013  Hemoglobin A1C deteriorated  Blood pressure at goal    Self Care Goals & Plans:  Self Care Goal 11/26/2013  Manage my medications take my medicines as prescribed; bring my medications to every visit; refill my medications on time  Monitor my health keep track of my blood glucose; bring my glucose meter and log to each visit; keep track of my blood pressure  Eat healthy foods eat more vegetables; eat foods that are low in salt; eat baked foods instead of fried foods  Be physically active -  Meeting treatment goals -    Home Blood Glucose Monitoring 01/25/2013  Check my blood sugar no home glucose monitoring  When to check my blood sugar -     Care Management & Community Referrals:  Referral 01/25/2013  Referrals made for care management support diabetes educator

## 2013-12-06 NOTE — Progress Notes (Signed)
Patient ID: [name], female   DOB: 1955/02/14, 58 y.o.   MRN: [id number]    Subjective:   Patient ID: [name] female   DOB: 1955-03-12 58 y.o.   MRN: [id number]  HPI: [name] is a 58 y.o. woman with past medical history of insulin-dependent Type II DM, hypertension, hyperlipidemia, CKD Stage 3, lumbar DDD, and anxiety/depression who presents with urinary symptoms.   She reports chronic urinary incontinence for over 1 year and has been seen multiple times in the ED and prescribed antibiotics for presumed UTI. She has had 6 urine cultures in the last month that have not been indicative of UTI with the most recent on 10/25 revealing 25K multiple bacterial morphotypes. She was most recently prescribed keflex to take for 5 days on 10/25 which she is still completing. She was seen yesterday [facility] where another culture was obtained that revealed 30K multiple bacterial morphotypes. Wet prep was done that revealed moderate WBC's. She reports urinary urgency and leakage of urine that is worse at night and does not occur with sneezing coughing, or straining. She denies urinary frequency, burning, vaginal discharge, or hematuria. She has chronic lower abdominal pain but denies suprapubic pain, fever, chills, nausea, vomiting, or flank pain. Recently she noticed brown/gray leakage of urine that worried her. She recently saw her gynecologist on 10/22 and had a normal pap smear. She denies being sexually active, new soaps/detergents, or douching. She had an abdominal US on 11/12/13 that was normal. She has never been on anticholingeric therapy in the past. She was unable to keep her urology appointment with [facility] because of the high co-payment.    Her last A1c on 8/4 was 8.8. Her most recent UA revealed glucosuria (250). She did not bring her glucose meter today but reports her blood sugar has been in the 400's recently. She reports compliance with taking  Levemir 30 U daily and Actos 15 mg daily. She denies polyuria. She is to see her PCP soon for follow-up of her diabetes.        Past Medical History  Diagnosis Date  . Diabetes mellitus type II, uncontrolled   . Hypertension   . Hyperlipidemia   . Ovarian cyst, left   . Anemia     due to menorrhagia, BL 8-10  . Vaginal cyst     nabothian and bartholin  . CKD (chronic kidney disease) stage 3, GFR 30-59 ml/min     baseline creatinine 1.4-1.7  . Anxiety   . Depression   . Postmenopausal bleeding 06/12/2008  . 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 UTI (lower urinary tract infection)   . IBS (irritable bowel syndrome)   . Chronic back pain   . Chronic abdominal pain   . DDD (degenerative disc disease), lumbar   . Bilateral renal cysts 01/15/2009    Qualifier: Diagnosis of  By: Tyrell Antonio MD, [place]     Current Outpatient Prescriptions  Medication Sig Dispense Refill  . acetaminophen (TYLENOL) 325 MG tablet Take 650 mg by mouth every 6 (six) hours as needed for moderate pain.      Marland Kitchen aspirin EC 81 MG tablet Take 81 mg by mouth daily.      . cephALEXin (KEFLEX) 500 MG capsule Take 1 capsule (500 mg total) by mouth 4 (four) times daily. Take all of medicine and drink lots of fluids  20 capsule  0  . dicyclomine (BENTYL) 20 MG tablet  Take 20 mg by mouth 2 (two) times daily.      . diphenhydramine-acetaminophen (TYLENOL PM) 25-500 MG TABS Take 2 tablets by mouth at bedtime as needed.      . enalapril (VASOTEC) 10 MG tablet Take 10 mg by mouth daily.      . Insulin Detemir (LEVEMIR FLEXTOUCH) 100 UNIT/ML Pen Inject 30 Units into the skin every evening.       . pioglitazone (ACTOS) 15 MG tablet Take 15 mg by mouth daily.      . simvastatin (ZOCOR) 40 MG tablet TAKE 1 TABLET (40 MG TOTAL) BY MOUTH AT BEDTIME.  30 tablet  5  . [DISCONTINUED] pantoprazole (PROTONIX) 20 MG tablet Take 2 tablets (40 mg total) by mouth daily.  30 tablet  1  . [DISCONTINUED] sertraline  (ZOLOFT) 100 MG tablet Take 1 tablet (100 mg total) by mouth daily.  30 tablet  2   No current facility-administered medications for this visit.   Family History  Problem Relation Age of Onset  . Stroke Father   . Heart attack Father     Had MI in his 26s  . Stomach cancer Paternal Grandmother   . Diabetes Maternal Grandmother   . Cerebral palsy Daughter   . Anesthesia problems Neg Hx   . Hypotension Neg Hx   . Malignant hyperthermia Neg Hx   . Pseudochol deficiency Neg Hx    History   Social History  . Marital Status: Divorced    Spouse Name: N/A    Number of Children: 1  . Years of Education: 12th grade   Occupational History  . 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 Smoking status: Never Smoker   . Smokeless tobacco: Former Systems developer  . Alcohol Use: No  . Drug Use: No  . Sexual Activity: No   Other Topics Concern  . 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   Review of Systems: Review of Systems  Constitutional: Negative for fever and chills.  Respiratory: Negative for shortness of breath.   Cardiovascular: Negative for chest pain.  Gastrointestinal: Positive for abdominal pain (chronic lower ). Negative for nausea, vomiting, diarrhea and constipation.  Genitourinary: Positive for urgency. Negative for dysuria, frequency, hematuria and flank pain.    Objective:  Physical Exam: Filed Vitals:   12/06/13 1404  BP: 146/73  Pulse: 98  Temp: 98.5 F (36.9 C)  TempSrc: Oral  SpO2: 100%    Physical Exam  Constitutional: She is oriented to person, place, and time. She appears well-developed and well-nourished. No distress.  HENT:  Head: Normocephalic and atraumatic.  Eyes: EOM are normal.  Neck: Normal range of motion. Neck supple.  Cardiovascular: Normal rate, regular rhythm and normal heart sounds.   Pulmonary/Chest: Effort normal and breath sounds normal. No respiratory distress. She  has no wheezes. She has no rales.  Abdominal: Soft. Bowel sounds are normal. She exhibits no distension. There is no tenderness. There is no rebound and no guarding.  Genitourinary:  No CVA tenderness b/l. No suprapubic tenderness.   Musculoskeletal: Normal range of motion. She exhibits no edema and no tenderness.  Neurological: She is alert and oriented to person, place, and time.  Skin: Skin is warm and dry. No rash noted. She is not diaphoretic. No erythema. No pallor.  Psychiatric: She has a normal mood and affect. Her behavior is normal. Judgment and thought content normal.  Assessment & Plan:   Please see problem list for problem-based assessment and plan

## 2013-12-07 NOTE — Assessment & Plan Note (Signed)
Assessment: Pt with moderate to well-controlled hypertension compliant with one-class anti-hypertensive therapy who presents with blood pressure of 146/73.    Plan:  -BP 146/73 near goal <140/90 -Continue enalapril 10 mg daily  -Last CMP on 11/18/13

## 2013-12-07 NOTE — Assessment & Plan Note (Addendum)
Assessment: Pt with chronic urinary urgency and leakage of urine with no evidence of urinary retention or UTI most likely due to urge incontinence.    Plan:  -Urine culture from 10/29 with 30K multiple bacterial morphotypes not indicative of UTI, pt instructed to complete course of recently prescribed cephalexin 500 mg BID on 10/25, would recommend no additional antibiotics to be prescribed as her symptoms are not due to recurrent UTI's and will only increase microbial resistance  -Perform bladder scan after void ---> 24 cc, no evidence of urinary retention, most likely not neurogenic bladder -Prescribe trial of oxybutynin 5 mg BID (may be able to obtain for free through [facility]) -Pt unable to afford copayment at [facility] -Pt to follow-up with PCP to address Type II DM

## 2013-12-09 ENCOUNTER — Encounter: Payer: Self-pay | Admitting: Internal Medicine

## 2013-12-11 ENCOUNTER — Other Ambulatory Visit: Payer: Self-pay | Admitting: Internal Medicine

## 2013-12-11 NOTE — Progress Notes (Signed)
Case discussed with Dr. Rabbani soon after the resident saw the patient.  We reviewed the resident's history and exam and pertinent patient test results.  I agree with the assessment, diagnosis, and plan of care documented in the resident's note. 

## 2013-12-14 ENCOUNTER — Other Ambulatory Visit: Payer: Self-pay | Admitting: Internal Medicine

## 2013-12-16 ENCOUNTER — Other Ambulatory Visit: Payer: Self-pay | Admitting: Urology

## 2013-12-16 DIAGNOSIS — N281 Cyst of kidney, acquired: Secondary | ICD-10-CM

## 2013-12-18 ENCOUNTER — Ambulatory Visit
Admission: RE | Admit: 2013-12-18 | Discharge: 2013-12-18 | Disposition: A | Discharge status: Home / Self Care | Payer: No Typology Code available for payment source | Source: Ambulatory Visit | Attending: Urology | Admitting: Urology

## 2013-12-18 DIAGNOSIS — N281 Cyst of kidney, acquired: Secondary | ICD-10-CM

## 2013-12-24 ENCOUNTER — Encounter: Payer: Self-pay | Admitting: Internal Medicine

## 2013-12-25 ENCOUNTER — Ambulatory Visit: Payer: No Typology Code available for payment source | Admitting: Internal Medicine

## 2013-12-27 NOTE — Addendum Note (Signed)
Addended by: Hulan Fray on: 12/27/2013 06:29 PM   Modules accepted: Orders

## 2013-12-29 ENCOUNTER — Emergency Department (INDEPENDENT_AMBULATORY_CARE_PROVIDER_SITE_OTHER)
Admission: EM | Admit: 2013-12-29 | Discharge: 2013-12-29 | Disposition: A | Discharge status: Home / Self Care | Payer: No Typology Code available for payment source | Source: Home / Self Care | Attending: Family Medicine | Admitting: Family Medicine

## 2013-12-29 ENCOUNTER — Encounter (HOSPITAL_COMMUNITY): Payer: Self-pay | Admitting: Emergency Medicine

## 2013-12-29 DIAGNOSIS — M549 Dorsalgia, unspecified: Secondary | ICD-10-CM

## 2013-12-29 DIAGNOSIS — R102 Pelvic and perineal pain: Secondary | ICD-10-CM

## 2013-12-29 DIAGNOSIS — G8929 Other chronic pain: Secondary | ICD-10-CM

## 2013-12-29 DIAGNOSIS — N949 Unspecified condition associated with female genital organs and menstrual cycle: Secondary | ICD-10-CM

## 2013-12-29 LAB — POCT URINALYSIS DIP (DEVICE)
Bilirubin Urine: NEGATIVE
Glucose, UA: NEGATIVE mg/dL
Hgb urine dipstick: NEGATIVE
Ketones, ur: NEGATIVE mg/dL
NITRITE: NEGATIVE
PH: 5 (ref 5.0–8.0)
PROTEIN: NEGATIVE mg/dL
SPECIFIC GRAVITY, URINE: 1.01 (ref 1.005–1.030)
UROBILINOGEN UA: 0.2 mg/dL (ref 0.0–1.0)

## 2013-12-29 NOTE — ED Provider Notes (Signed)
CSN: [id number]     Arrival date & time 12/29/13  1659 History   First MD Initiated Contact with Patient 12/29/13 1712     Chief Complaint  Patient presents with  . Urinary Tract Infection   (Consider location/radiation/quality/duration/timing/severity/associated sxs/prior Treatment) HPI Comments: Patient endorses history of both chronic lower back pain and chronic pelvic and abdominal pain. At the time of today's visit, these symptoms are no different that usual. Patient requests testing for UTI stating that because this week is a holiday week, she just wants to make sure that that she does not have a UTI.   Patient is a 58 y.o. female presenting with urinary tract infection. The history is provided by the patient.  Urinary Tract Infection    Past Medical History  Diagnosis Date  . Diabetes mellitus type II, uncontrolled   . Hypertension   . Hyperlipidemia   . Ovarian cyst, left   . Anemia     due to menorrhagia, BL 8-10  . Vaginal cyst     nabothian and bartholin  . CKD (chronic kidney disease) stage 3, GFR 30-59 ml/min     baseline creatinine 1.4-1.7  . Anxiety   . Depression   . Postmenopausal bleeding 06/12/2008  . 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 UTI (lower urinary tract infection)   . IBS (irritable bowel syndrome)   . Chronic back pain   . Chronic abdominal pain   . DDD (degenerative disc disease), lumbar   . Bilateral renal cysts 01/15/2009    Qualifier: Diagnosis of  By: Tyrell Antonio MD, Jerald Kief     Past Surgical History  Procedure Laterality Date  . Cardiac surgery      to repair congenital defect as a child   Family History  Problem Relation Age of Onset  . Stroke Father   . Heart attack Father     Had MI in his 59s  . Stomach cancer Paternal Grandmother   . Diabetes Maternal Grandmother   . Cerebral palsy Daughter   . Anesthesia problems Neg Hx   . Hypotension Neg Hx   . Malignant hyperthermia Neg Hx   . Pseudochol  deficiency Neg Hx    History  Substance Use Topics  . Smoking status: Never Smoker   . Smokeless tobacco: Former Systems developer  . Alcohol Use: No   OB History    Gravida Para Term Preterm AB TAB SAB Ectopic Multiple Living   3 1 1  2  2   1      Review of Systems  Constitutional: Negative for fever, chills, appetite change and fatigue.  HENT: Negative.   Respiratory: Negative.   Cardiovascular: Negative.   Gastrointestinal:       See HPI  Endocrine: Negative for polydipsia, polyphagia and polyuria.  Genitourinary: Negative for dysuria, urgency, frequency, hematuria, flank pain, decreased urine volume, vaginal bleeding, vaginal discharge, difficulty urinating, genital sores and vaginal pain.       See HPI  Musculoskeletal:       See HPI    Allergies  Review of patient's allergies indicates no known allergies.  Home Medications   Prior to Admission medications   Medication Sig Start Date End Date Taking? Authorizing Provider  acetaminophen (TYLENOL) 325 MG tablet Take 650 mg by mouth every 6 (six) hours as needed for moderate pain.    Historical Provider, MD  aspirin EC 81 MG tablet Take 81 mg by mouth daily.    Historical Provider,  MD  dicyclomine (BENTYL) 20 MG tablet Take 20 mg by mouth 2 (two) times daily.    Historical Provider, MD  diphenhydramine-acetaminophen (TYLENOL PM) 25-500 MG TABS Take 2 tablets by mouth at bedtime as needed.    Historical Provider, MD  enalapril (VASOTEC) 10 MG tablet TAKE 1 TABLET (10 MG TOTAL) BY MOUTH DAILY. 12/16/13   Ejiroghene Arlyce Dice, MD  Insulin Detemir (LEVEMIR FLEXTOUCH) 100 UNIT/ML Pen Inject 30 Units into the skin every evening.     Historical Provider, MD  oxybutynin (DITROPAN) 5 MG tablet Take 1 tablet (5 mg total) by mouth 2 (two) times daily. 12/06/13   [name], MD  pioglitazone (ACTOS) 15 MG tablet Take 15 mg by mouth daily.    Historical Provider, MD  simvastatin (ZOCOR) 40 MG tablet TAKE 1 TABLET (40 MG TOTAL) BY MOUTH AT  BEDTIME. 11/26/13   [name], MD   BP 139/70 mmHg  Pulse 93  Temp(Src) 98.6 F (37 C) (Oral)  Resp 16  SpO2 98% Physical Exam  Constitutional: She is oriented to person, place, and time. She appears well-developed and well-nourished. No distress.  HENT:  Head: Normocephalic and atraumatic.  Cardiovascular: Normal rate, regular rhythm and normal heart sounds.   Pulmonary/Chest: Effort normal and breath sounds normal.  Abdominal: Soft. Normal appearance and bowel sounds are normal. She exhibits no distension. There is no tenderness. There is no CVA tenderness.  Musculoskeletal: Normal range of motion.  Neurological: She is alert and oriented to person, place, and time.  Skin: Skin is warm and dry.  Psychiatric: She has a normal mood and affect. Her behavior is normal.  Nursing note and vitals reviewed.   ED Course  Procedures (including critical care time) Labs Review Labs Reviewed  POCT URINALYSIS DIP (DEVICE) - Abnormal; Notable for the following:    Leukocytes, UA SMALL (*)    All other components within normal limits  URINE CULTURE    Imaging Review No results found.   MDM   1. Chronic back pain   2. Chronic pelvic pain in female    UA grossly normal.  Will hold for C&S and contact patient should culture indicate need for treatment. Patient advised to follow up with PCP should additional symptoms arise.    Lutricia Feil, [place] 12/29/13 1734

## 2013-12-29 NOTE — Discharge Instructions (Signed)
Urine studies normal. Specimen will be held for culture and if results indicate the need for treatment, you will be contacted by phone. If symptoms worsen or do not improve, please follow up with your doctor.   Chronic Back Pain  When back pain lasts longer than 3 months, it is called chronic back pain.People with chronic back pain often go through certain periods that are more intense (flare-ups).  CAUSES Chronic back pain can be caused by wear and tear (degeneration) on different structures in your back. These structures include:  The bones of your spine (vertebrae) and the joints surrounding your spinal cord and nerve roots (facets).  The strong, fibrous tissues that connect your vertebrae (ligaments). Degeneration of these structures may result in pressure on your nerves. This can lead to constant pain. HOME CARE INSTRUCTIONS  Avoid bending, heavy lifting, prolonged sitting, and activities which make the problem worse.  Take brief periods of rest throughout the day to reduce your pain. Lying down or standing usually is better than sitting while you are resting.  Take over-the-counter or prescription medicines only as directed by your caregiver. SEEK IMMEDIATE MEDICAL CARE IF:   You have weakness or numbness in one of your legs or feet.  You have trouble controlling your bladder or bowels.  You have nausea, vomiting, abdominal pain, shortness of breath, or fainting. Document Released: 03/03/2004 Document Revised: 04/18/2011 Document Reviewed: 01/08/2011 [facility] Patient Information 2015 [place], [place]. This information is not intended to replace advice given to you by your health care provider. Make sure you discuss any questions you have with your health care provider.  Pelvic Pain Female pelvic pain can be caused by many different things and start from a variety of places. Pelvic pain refers to pain that is located in the lower half of the abdomen and between your hips. The pain  may occur over a short period of time (acute) or may be reoccurring (chronic). The cause of pelvic pain may be related to disorders affecting the female reproductive organs (gynecologic), but it may also be related to the bladder, kidney stones, an intestinal complication, or muscle or skeletal problems. Getting help right away for pelvic pain is important, especially if there has been severe, sharp, or a sudden onset of unusual pain. It is also important to get help right away because some types of pelvic pain can be life threatening.  CAUSES  Below are only some of the causes of pelvic pain. The causes of pelvic pain can be in one of several categories.   Gynecologic.  Pelvic inflammatory disease.  Sexually transmitted infection.  Ovarian cyst or a twisted ovarian ligament (ovarian torsion).  Uterine lining that grows outside the uterus (endometriosis).  Fibroids, cysts, or tumors.  Ovulation.  Pregnancy.  Pregnancy that occurs outside the uterus (ectopic pregnancy).  Miscarriage.  Labor.  Abruption of the placenta or ruptured uterus.  Infection.  Uterine infection (endometritis).  Bladder infection.  Diverticulitis.  Miscarriage related to a uterine infection (septic abortion).  Bladder.  Inflammation of the bladder (cystitis).  Kidney stone(s).  Gastrointestinal.  Constipation.  Diverticulitis.  Neurologic.  Trauma.  Feeling pelvic pain because of mental or emotional causes (psychosomatic).  Cancers of the bowel or pelvis. EVALUATION  Your caregiver will want to take a careful history of your concerns. This includes recent changes in your health, a careful gynecologic history of your periods (menses), and a sexual history. Obtaining your family history and medical history is also important. Your caregiver may suggest  a pelvic exam. A pelvic exam will help identify the location and severity of the pain. It also helps in the evaluation of which organ system  may be involved. In order to identify the cause of the pelvic pain and be properly treated, your caregiver may order tests. These tests may include:   A pregnancy test.  Pelvic ultrasonography.  An X-ray exam of the abdomen.  A urinalysis or evaluation of vaginal discharge.  Blood tests. HOME CARE INSTRUCTIONS   Only take over-the-counter or prescription medicines for pain, discomfort, or fever as directed by your caregiver.   Rest as directed by your caregiver.   Eat a balanced diet.   Drink enough fluids to make your urine clear or pale yellow, or as directed.   Avoid sexual intercourse if it causes pain.   Apply warm or cold compresses to the lower abdomen depending on which one helps the pain.   Avoid stressful situations.   Keep a journal of your pelvic pain. Write down when it started, where the pain is located, and if there are things that seem to be associated with the pain, such as food or your menstrual cycle.  Follow up with your caregiver as directed.  SEEK MEDICAL CARE IF:  Your medicine does not help your pain.  You have abnormal vaginal discharge. SEEK IMMEDIATE MEDICAL CARE IF:   You have heavy bleeding from the vagina.   Your pelvic pain increases.   You feel light-headed or faint.   You have chills.   You have pain with urination or blood in your urine.   You have uncontrolled diarrhea or vomiting.   You have a fever or persistent symptoms for more than 3 days.  You have a fever and your symptoms suddenly get worse.   You are being physically or sexually abused.  MAKE SURE YOU:  Understand these instructions.  Will watch your condition.  Will get help if you are not doing well or get worse. Document Released: 12/22/2003 Document Revised: 06/10/2013 Document Reviewed: 05/16/2011 [facility] Patient Information 2015 [place], [place]. This information is not intended to replace advice given to you by your health care provider.  Make sure you discuss any questions you have with your health care provider.

## 2013-12-31 ENCOUNTER — Ambulatory Visit: Payer: No Typology Code available for payment source | Admitting: Pulmonary Disease

## 2013-12-31 ENCOUNTER — Other Ambulatory Visit: Payer: Self-pay | Admitting: *Deleted

## 2013-12-31 LAB — URINE CULTURE: Colony Count: 45000

## 2014-01-02 MED ORDER — PIOGLITAZONE HCL 15 MG PO TABS: 15.0000 mg | ORAL_TABLET | Freq: Every day | ORAL | Status: DC

## 2014-01-06 ENCOUNTER — Encounter: Payer: Self-pay | Admitting: Internal Medicine

## 2014-01-06 ENCOUNTER — Ambulatory Visit (INDEPENDENT_AMBULATORY_CARE_PROVIDER_SITE_OTHER): Payer: No Typology Code available for payment source | Admitting: Internal Medicine

## 2014-01-06 VITALS — BP 133/64 | HR 85 | Temp 98.2°F | Ht 64.0 in | Wt 160.0 lb

## 2014-01-06 DIAGNOSIS — E1121 Type 2 diabetes mellitus with diabetic nephropathy: Secondary | ICD-10-CM

## 2014-01-06 DIAGNOSIS — E1165 Type 2 diabetes mellitus with hyperglycemia: Secondary | ICD-10-CM

## 2014-01-06 DIAGNOSIS — E1129 Type 2 diabetes mellitus with other diabetic kidney complication: Secondary | ICD-10-CM

## 2014-01-06 DIAGNOSIS — K219 Gastro-esophageal reflux disease without esophagitis: Secondary | ICD-10-CM

## 2014-01-06 DIAGNOSIS — Z794 Long term (current) use of insulin: Secondary | ICD-10-CM

## 2014-01-06 LAB — GLUCOSE, CAPILLARY: GLUCOSE-CAPILLARY: 261 mg/dL — AB (ref 70–99)

## 2014-01-06 LAB — POCT GLYCOSYLATED HEMOGLOBIN (HGB A1C): Hemoglobin A1C: 9.1

## 2014-01-06 NOTE — Assessment & Plan Note (Signed)
Lab Results  Component Value Date   HGBA1C 9.1 01/06/2014   HGBA1C 8.8 09/10/2013   HGBA1C 13.3 05/08/2013     Assessment: Diabetes control: poor control (HgbA1C >9%) Progress toward A1C goal:  unchanged Comments: Pt with 1 documented episode of hypoglycemia to 65, which was early this morning, for which she was symptomatic. No other symptomatic episodes. Per pt, she ate only a very light dinner last night, which she feels is the reason for the hypoglycemia. She states that she is having trouble finding good healthy food on a fixed income.   Plan: Medications:  continue current medications: Levemir 30u qhs and Actos 15mg  po daily Home glucose monitoring: Frequency: once a day Timing: after lunch Instruction/counseling given: reminded to bring blood glucose meter & log to each visit, discussed diet and other instruction/counseling: She was asked to check her CBGs 3-4 times a day before meals and before bedtime over the next 2 weeks and bring her meter into the clinic after 2 weeks. She was asked to eat 3 well balanced meals a day or 5 smaller well balalnced meals a day. She was referred to our clinic diabetes educator and nutritionist for assistance with meal/food suggestions.  Other plans: F/u in 2 weeks for possible adjustment to her diabetes regimen.

## 2014-01-06 NOTE — Progress Notes (Signed)
Patient ID: [name], female   DOB: [birth year], 58 y.o.   MRN: [id number]  Subjective:   Patient ID: [name] female   DOB: Jan 20, 1956 58 y.o.   MRN: [id number]  HPI: [name] is a 58 y.o. F w/ PMH hypertension, hyperlipidemia, CKD Stage 3, lumbar DDD, anxiety/depression, and insulin-dependent Type II DM, who presents for an acute visit for hypoglycemia.   She states that her CBGs are in the 60s at night but run high during the day into the upper 200s-300s. She is currently taking Levemir 30u qhs and Actos 15mg  po daily. A1c is 9.1 today from 8.8 in August. She is not checking her CBGs regularly, and only has 9 readings on her glucometer for this month. She took her CBG early this morning b/c she awoke with sweats, and it was 65. She denies any other lows or symptoms of hypoglycemia. She states that she did not eat much for dinner last night, which is why she feels she had the hypoglycemia.   She endorses abd pain, decreased appetite, and constipation. She denies fevers, chills, chest pain, SOB, diarrhea, or changes in urination.   Past Medical History  Diagnosis Date  . Diabetes mellitus type II, uncontrolled   . Hypertension   . Hyperlipidemia   . Ovarian cyst, left   . Anemia     due to menorrhagia, BL 8-10  . Vaginal cyst     nabothian and bartholin  . CKD (chronic kidney disease) stage 3, GFR 30-59 ml/min     baseline creatinine 1.4-1.7  . Anxiety   . Depression   . Postmenopausal bleeding 06/12/2008  . 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 UTI (lower urinary tract infection)   . IBS (irritable bowel syndrome)   . Chronic back pain   . Chronic abdominal pain   . DDD (degenerative disc disease), lumbar   . Bilateral renal cysts 01/15/2009    Qualifier: Diagnosis of  By: Tyrell Antonio MD, [place]     Current Outpatient Prescriptions  Medication Sig Dispense Refill  . acetaminophen (TYLENOL) 325 MG tablet Take 650 mg by mouth every 6 (six)  hours as needed for moderate pain.    Marland Kitchen aspirin EC 81 MG tablet Take 81 mg by mouth daily.    Marland Kitchen dicyclomine (BENTYL) 20 MG tablet Take 20 mg by mouth 2 (two) times daily.    . diphenhydramine-acetaminophen (TYLENOL PM) 25-500 MG TABS Take 2 tablets by mouth at bedtime as needed.    . enalapril (VASOTEC) 10 MG tablet TAKE 1 TABLET (10 MG TOTAL) BY MOUTH DAILY. 30 tablet 2  . Insulin Detemir (LEVEMIR FLEXTOUCH) 100 UNIT/ML Pen Inject 30 Units into the skin every evening.     Marland Kitchen oxybutynin (DITROPAN) 5 MG tablet Take 1 tablet (5 mg total) by mouth 2 (two) times daily. 60 tablet 2  . pioglitazone (ACTOS) 15 MG tablet Take 1 tablet (15 mg total) by mouth daily. 30 tablet 2  . simvastatin (ZOCOR) 40 MG tablet TAKE 1 TABLET (40 MG TOTAL) BY MOUTH AT BEDTIME. 30 tablet 5  . [DISCONTINUED] pantoprazole (PROTONIX) 20 MG tablet Take 2 tablets (40 mg total) by mouth daily. 30 tablet 1  . [DISCONTINUED] sertraline (ZOLOFT) 100 MG tablet Take 1 tablet (100 mg total) by mouth daily. 30 tablet 2   No current facility-administered medications for this visit.   Family History  Problem Relation Age of Onset  . Stroke Father   . Heart  attack Father     Had MI in his 29s  . Stomach cancer Paternal Grandmother   . Diabetes Maternal Grandmother   . Cerebral palsy Daughter   . Anesthesia problems Neg Hx   . Hypotension Neg Hx   . Malignant hyperthermia Neg Hx   . Pseudochol deficiency Neg Hx    History   Social History  . Marital Status: Divorced    Spouse Name: N/A    Number of Children: 1  . Years of Education: 12th grade   Occupational History  . 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 Smoking status: Never Smoker   . Smokeless tobacco: Former Systems developer  . Alcohol Use: No  . Drug Use: No  . Sexual Activity: No   Other Topics Concern  . 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   Review of Systems: A 12 point ROS was performed;  pertinent positives and negatives were noted in the HPI   Objective:  Physical Exam: Filed Vitals:   01/06/14 1526  BP: 133/64  Pulse: 85  Temp: 98.2 F (36.8 C)  TempSrc: Oral  Height: 5\' 4"  (1.626 m)  Weight: 160 lb (72.576 kg)  SpO2: 99%   Constitutional: Vital signs reviewed.  Patient is a well-developed and well-nourished female in no acute distress and cooperative with exam. Alert and oriented x3.  Head: Normocephalic and atraumatic Eyes: PERRL, EOMI.  No scleral icterus.  Neck: Trachea midline Cardiovascular: RRR, no MRG Pulmonary/Chest: normal respiratory effort, CTAB, no wheezes, rales, or rhonchi Abdominal: Soft. Non-tender, non-distended Musculoskeletal: No joint deformities . Moves all 4 extremities.  Neurological: A&O x3, cranial nerve II-XII are grossly intact, no focal motor deficit  Skin: Warm, dry and intact. Psychiatric: Normal mood and affect.   Assessment & Plan:   Please refer to Problem List based Assessment and Plan

## 2014-01-06 NOTE — Patient Instructions (Signed)
**  Be sure to eat 3 balanced meals a day or 5 smaller well balanced meals a day. This will help prevent low blood sugars.  **Begin checking your blood sugars 4 times a day, if possible. Check before meals and before bedtime.   **Return to the clinic in 2 weeks with your meter, and we will adjust your insulin at that time.   **I have referred you to [facility], our diabetes coordinator and nutritionist. She will help you with meal planning and guide you on what to eat.   **If you begin to see more low blood sugars, please call the clinic. We will need to decrease your insulin.    General Instructions:   Thank you for bringing your medicines today. This helps [place] keep you safe from mistakes.   Progress Toward Treatment Goals:  Treatment Goal 01/06/2014  Hemoglobin A1C unchanged  Blood pressure at goal    Self Care Goals & Plans:  Self Care Goal 01/06/2014  Manage my medications take my medicines as prescribed; bring my medications to every visit; refill my medications on time  Monitor my health keep track of my blood glucose; bring my glucose meter and log to each visit; check my feet daily  Eat healthy foods eat more vegetables; eat foods that are low in salt; eat baked foods instead of fried foods  Be physically active find an activity I enjoy; take a walk every day  Meeting treatment goals -    Home Blood Glucose Monitoring 01/06/2014  Check my blood sugar once a day  When to check my blood sugar after lunch     Care Management & Community Referrals:  Referral 01/25/2013  Referrals made for care management support diabetes educator

## 2014-01-06 NOTE — Progress Notes (Signed)
Internal Medicine Clinic Attending  Case discussed with Dr. Glenn soon after the resident saw the patient.  We reviewed the resident's history and exam and pertinent patient test results.  I agree with the assessment, diagnosis, and plan of care documented in the resident's note. 

## 2014-01-06 NOTE — Assessment & Plan Note (Signed)
Pt states that she uses Pepcid intermittently for heartburn and IBS. Pepcid 40mg  po daily was added to her current medication list.

## 2014-01-07 ENCOUNTER — Telehealth: Payer: Self-pay | Admitting: *Deleted

## 2014-01-07 NOTE — Telephone Encounter (Signed)
Request from [facility] -  has nearly exhausted its sample supply of Levemir flexpen and it is no longer available through pt assistance. Pt prefers using an insulin pen for convenience and ease of administration. Would you be willing to consider changing Levemir flexpen to Lantus Solostar?  If yes, please fax a new prescription to [facility] at [phone]. Hilda Blades Kariana Wiles RN 01/07/14 10:50AM

## 2014-01-08 ENCOUNTER — Other Ambulatory Visit: Payer: Self-pay | Admitting: Internal Medicine

## 2014-01-08 MED ORDER — INSULIN GLARGINE 100 UNITS/ML SOLOSTAR PEN: 28.0000 [IU] | PEN_INJECTOR | Freq: Every day | SUBCUTANEOUS | Status: DC

## 2014-01-08 MED ORDER — INSULIN DETEMIR 100 UNIT/ML FLEXPEN: 30.0000 [IU] | PEN_INJECTOR | Freq: Every day | SUBCUTANEOUS | Status: DC

## 2014-01-08 NOTE — Telephone Encounter (Signed)
Called patient about changing Levemir to Lantus. Pt admantly refuses to do this. Says she likes Levemir and will find a way to get it, though the [facility] program says they can no longer do the free sample flex pen- Levemir. Pt has 3 pens presently she just got and will last for a while. Will need to address this issue later. For now pt refuses.  [name].

## 2014-01-17 ENCOUNTER — Ambulatory Visit (INDEPENDENT_AMBULATORY_CARE_PROVIDER_SITE_OTHER): Payer: Self-pay | Admitting: Internal Medicine

## 2014-01-17 ENCOUNTER — Encounter: Payer: Self-pay | Admitting: Internal Medicine

## 2014-01-17 DIAGNOSIS — E1122 Type 2 diabetes mellitus with diabetic chronic kidney disease: Secondary | ICD-10-CM

## 2014-01-17 DIAGNOSIS — E1129 Type 2 diabetes mellitus with other diabetic kidney complication: Secondary | ICD-10-CM

## 2014-01-17 DIAGNOSIS — N3941 Urge incontinence: Secondary | ICD-10-CM

## 2014-01-17 DIAGNOSIS — R109 Unspecified abdominal pain: Secondary | ICD-10-CM

## 2014-01-17 DIAGNOSIS — E1165 Type 2 diabetes mellitus with hyperglycemia: Secondary | ICD-10-CM

## 2014-01-17 DIAGNOSIS — R1030 Lower abdominal pain, unspecified: Secondary | ICD-10-CM

## 2014-01-17 DIAGNOSIS — Z794 Long term (current) use of insulin: Secondary | ICD-10-CM

## 2014-01-17 LAB — GLUCOSE, CAPILLARY: Glucose-Capillary: 236 mg/dL — ABNORMAL HIGH (ref 70–99)

## 2014-01-17 NOTE — Patient Instructions (Signed)
General Instructions:  We will be dividing the dose of your levemir. Take 15u in the morning and 15 at night. This will prevent your bloo sugars from dropping too low in the morning.]  Please it is also very important you make your appointment for your colonoscopy.   If you have this abdominal pain- just take tylenol. So far we have done everything to find the course and we have not explanation for this.   It was nice seeing you today.  Thank you for bringing your medicines today. This helps [place] keep you safe from mistakes.

## 2014-01-17 NOTE — Progress Notes (Signed)
Patient ID: [name], female   DOB: 02-Nov-1955, 58 y.o.   MRN: [id number]   Subjective:   Patient ID: [name] female   DOB: [birth year] 58 y.o.   MRN: [id number]  HPI: [name] is a 58 y.o. With PMH listed below. Presented today with complaints of right lower abdominal pain, going on for 1 week now. Aggrav factors- None, relieving factors- tylenol. Non radiating. Feels like its on the left side. Pain is not present now. No dysuria. Pt is not sexually active- in at least 5 years. No vaginal discharge. Last menstraul period years ago- cant remember exact date. Pt was referred to urology for complaints of urinary frequency, pt was prescribed Oxybutinin but pt has not taken medication at all, she just refuses to take it for no reason, she has follow up appointment in march. Last bowel movement was today- soft, normal. No vomiting. No fever or chills.  Pt is scheduled for  A colonoscopy likely next month. She say she is depressed because she has to take care of her disabled daughter alone, and has no money or insurance. Pt denies poor sleep, wakes up due to frequency, pt denies poor appetite, poor concentration, pt denies suicidal or homicidal ideation, she denies reduced energy or anhedonia.    Past Medical History  Diagnosis Date  . Diabetes mellitus type II, uncontrolled   . Hypertension   . Hyperlipidemia   . Ovarian cyst, left   . Anemia     due to menorrhagia, BL 8-10  . Vaginal cyst     nabothian and bartholin  . CKD (chronic kidney disease) stage 3, GFR 30-59 ml/min     baseline creatinine 1.4-1.7  . Anxiety   . Depression   . Postmenopausal bleeding 06/12/2008  . 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 UTI (lower urinary tract infection)   . IBS (irritable bowel syndrome)   . Chronic back pain   . Chronic abdominal pain   . DDD (degenerative disc disease), lumbar   . Bilateral renal cysts 01/15/2009    Qualifier: Diagnosis of  By: Tyrell Antonio MD,  [place]     Current Outpatient Prescriptions  Medication Sig Dispense Refill  . acetaminophen (TYLENOL) 325 MG tablet Take 650 mg by mouth every 6 (six) hours as needed for moderate pain.    Marland Kitchen aspirin EC 81 MG tablet Take 81 mg by mouth daily.    Marland Kitchen dicyclomine (BENTYL) 20 MG tablet Take 20 mg by mouth 2 (two) times daily.    . diphenhydramine-acetaminophen (TYLENOL PM) 25-500 MG TABS Take 2 tablets by mouth at bedtime as needed.    . enalapril (VASOTEC) 10 MG tablet TAKE 1 TABLET (10 MG TOTAL) BY MOUTH DAILY. 30 tablet 2  . famotidine (PEPCID) 40 MG tablet Take 40 mg by mouth daily.    . Insulin Detemir (LEVEMIR FLEXPEN) 100 UNIT/ML Pen Inject 30 Units into the skin daily at 10 pm. 15 mL 11  . oxybutynin (DITROPAN) 5 MG tablet Take 1 tablet (5 mg total) by mouth 2 (two) times daily. (Patient not taking: Reported on 01/06/2014) 60 tablet 2  . pioglitazone (ACTOS) 15 MG tablet Take 1 tablet (15 mg total) by mouth daily. 30 tablet 2  . simvastatin (ZOCOR) 40 MG tablet TAKE 1 TABLET (40 MG TOTAL) BY MOUTH AT BEDTIME. 30 tablet 5  . [DISCONTINUED] pantoprazole (PROTONIX) 20 MG tablet Take 2 tablets (40 mg total) by mouth daily. 30 tablet 1  . [  DISCONTINUED] sertraline (ZOLOFT) 100 MG tablet Take 1 tablet (100 mg total) by mouth daily. 30 tablet 2   No current facility-administered medications for this visit.   Family History  Problem Relation Age of Onset  . Stroke Father   . Heart attack Father     Had MI in his 74s  . Stomach cancer Paternal Grandmother   . Diabetes Maternal Grandmother   . Cerebral palsy Daughter   . Anesthesia problems Neg Hx   . Hypotension Neg Hx   . Malignant hyperthermia Neg Hx   . Pseudochol deficiency Neg Hx    History   Social History  . Marital Status: Divorced    Spouse Name: N/A    Number of Children: 1  . Years of Education: 12th grade   Occupational History  . 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 Smoking  status: Never Smoker   . Smokeless tobacco: Former Systems developer  . Alcohol Use: No  . Drug Use: No  . Sexual Activity: No   Other Topics Concern  . 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   Review of Systems: CONSTITUTIONAL- No Fever, weightloss, has some night sweat with hot flashes, no change in appetite. SKIN- No Rash, colour changes or itching. HEAD- No Headache or dizziness. EYES- No Vision loss,  or blurred vision. Mouth/throat- No Sorethroat, dentures, or bleeding gums. RESPIRATORY- No Cough or SOB. CARDIAC- No Palpitations, DOE, PND or chest pain. URINARY- Frequency at night but this is improving, no urgency, straining or dysuria. NEUROLOGIC- No Numbness, syncope, seizures or burning. [facility]- Denies depression or anxiety.  Objective:  Physical Exam: Filed Vitals:   01/17/14 1519  BP: 131/64  Pulse: 92  Temp: 98.5 F (36.9 C)  TempSrc: Oral  Weight: 162 lb (73.483 kg)  SpO2: 97%   GENERAL- alert, co-operative, appears as stated age, not in any distress, tends to ramble a lot, when asked simple questions. HEENT- Atraumatic, normocephalic, PERRL, EOMI, oral mucosa appears , neck supple. CARDIAC- RRR, no murmurs, rubs or gallops. RESP- Moving equal volumes of air, and clear to auscultation bilaterally, no wheezes or crackles. ABDOMEN- Soft, nontender- even with significant pressure in right lower quadrant, , no guarding or rebound, no palpable masses or organomegaly, bowel sounds present. BACK- Normal curvature of the spine, No tenderness along the vertebrae, no CVA tenderness. NEURO- No obvious Cr N abnormality, strenght upper and lower extremities- intact, Gait- Normal. EXTREMITIES- pulse 2+, symmetric, no pedal edema. SKIN- Warm, dry, No rash or lesion. PSYCH- Normal mood and affect, appropriate thought content and speech, speech some times tangential.  Assessment & Plan:   The patient's case and plan of care was discussed with attending  physician, Dr. Daryll Drown.  Please see problem based charting for assessment and plan.

## 2014-01-18 MED ORDER — INSULIN DETEMIR 100 UNIT/ML FLEXPEN: 30.0000 [IU] | PEN_INJECTOR | Freq: Two times a day (BID) | SUBCUTANEOUS | Status: DC

## 2014-01-18 NOTE — Assessment & Plan Note (Signed)
Still present. Has not tried oxybutinin, for no reason, she brought the medication today. Pt frequency at night may be osmotic polyuria from high blood sugar readings at night, hopefully should improve with better control of her blood sugars.

## 2014-01-18 NOTE — Assessment & Plan Note (Signed)
Previously pain was on the left. Today it is on the right. Extensive work up so far negative. Pain surprising is relief with just tylenol. Pt says she was about to go to the urgent care center today for this complaint.  Pt has no pain today, but that abdominal pain- right lower quadrant is the reason for clinic visit today. Screened pt for depression, but apart from depressed mood, she has no other symptoms qualifying for the diagnosis, Dysthymia is a consideration.   Plan- Reassured patient, She can make an appointment to see [place] in clinic, if pain is unchanged from prior, without fever, vomiting or nausea, bloody stools, or any other alarm symptoms. Also that despite extensive workup, specialists referals and several clinic, Ed and urgent care visits, no cause has been identified. Taking tylenol to relieve pain is okay. She should not take more than 2 tablets in a day. - Pt voiced understanding.  - Pt encouraged to keep follow up appointment with urologist in march and colonoscopy scheduled for next month. Pt agreed.

## 2014-01-18 NOTE — Assessment & Plan Note (Signed)
Lab Results  Component Value Date   HGBA1C 9.1 01/06/2014   HGBA1C 8.8 09/10/2013   HGBA1C 13.3 05/08/2013     Assessment: Diabetes control:  Uncontrolled Progress toward A1C goal:   Not at goal Comments: Compliant with 30u of levemir daily. Pt brought glucometer today, 20 reads since last visit- 11/30. Fasting readings- 4 reads below 70- 58, 58, 60, 60. Evening readings- 200s- 300s. 1 day time readings 80s. Pt takes her levemir at night. As she wake sup late in the morning.   Plan: Medications:  Considering highs in the evenings. Pt has declined using a more complicated regimen of short acting insulin with meals, also meals are not regular. Pt has agreed to split dose of levemir- 15u BID. Pt has insisted in using levemir. For the dose she is taking, duration of action is ~19 hours, and is less with smaller doses. Spliting doses will prevent morning lows and reduce evening highs.  Home glucose monitoring: Frequency:  three times daily Timing:   Instruction/counseling given: reminded to bring blood glucose meter & log to each visit, reminded to bring medications to each visit and discussed diet Educational resources provided:   Self management tools provided: copy of home glucose meter download Other plans: Refferal to [place] finally agrees she needs more education about what she should and shouldn't eat. - See in a month with log.

## 2014-01-20 ENCOUNTER — Ambulatory Visit: Payer: Self-pay | Admitting: Internal Medicine

## 2014-01-20 NOTE — Progress Notes (Signed)
Internal Medicine Clinic Attending  Case discussed with Dr. Emokpae soon after the resident saw the patient.  We reviewed the resident's history and exam and pertinent patient test results.  I agree with the assessment, diagnosis, and plan of care documented in the resident's note. 

## 2014-02-02 ENCOUNTER — Emergency Department (HOSPITAL_COMMUNITY): Payer: Self-pay

## 2014-02-02 ENCOUNTER — Observation Stay (HOSPITAL_COMMUNITY): Payer: Self-pay

## 2014-02-02 ENCOUNTER — Observation Stay (HOSPITAL_COMMUNITY)
Admission: EM | Admit: 2014-02-02 | Discharge: 2014-02-03 | Disposition: A | Discharge status: Home / Self Care | Payer: Self-pay | Attending: Internal Medicine | Admitting: Internal Medicine

## 2014-02-02 ENCOUNTER — Encounter (HOSPITAL_COMMUNITY): Payer: Self-pay | Admitting: *Deleted

## 2014-02-02 DIAGNOSIS — E1159 Type 2 diabetes mellitus with other circulatory complications: Secondary | ICD-10-CM | POA: Diagnosis present

## 2014-02-02 DIAGNOSIS — F411 Generalized anxiety disorder: Secondary | ICD-10-CM | POA: Diagnosis present

## 2014-02-02 DIAGNOSIS — I129 Hypertensive chronic kidney disease with stage 1 through stage 4 chronic kidney disease, or unspecified chronic kidney disease: Secondary | ICD-10-CM | POA: Insufficient documentation

## 2014-02-02 DIAGNOSIS — F419 Anxiety disorder, unspecified: Secondary | ICD-10-CM | POA: Insufficient documentation

## 2014-02-02 DIAGNOSIS — R7989 Other specified abnormal findings of blood chemistry: Secondary | ICD-10-CM

## 2014-02-02 DIAGNOSIS — I4891 Unspecified atrial fibrillation: Secondary | ICD-10-CM | POA: Insufficient documentation

## 2014-02-02 DIAGNOSIS — R002 Palpitations: Secondary | ICD-10-CM | POA: Insufficient documentation

## 2014-02-02 DIAGNOSIS — E785 Hyperlipidemia, unspecified: Secondary | ICD-10-CM | POA: Insufficient documentation

## 2014-02-02 DIAGNOSIS — N183 Chronic kidney disease, stage 3 unspecified: Secondary | ICD-10-CM | POA: Diagnosis present

## 2014-02-02 DIAGNOSIS — F329 Major depressive disorder, single episode, unspecified: Secondary | ICD-10-CM | POA: Insufficient documentation

## 2014-02-02 DIAGNOSIS — R079 Chest pain, unspecified: Principal | ICD-10-CM

## 2014-02-02 DIAGNOSIS — R0600 Dyspnea, unspecified: Secondary | ICD-10-CM

## 2014-02-02 DIAGNOSIS — E119 Type 2 diabetes mellitus without complications: Secondary | ICD-10-CM

## 2014-02-02 DIAGNOSIS — G8929 Other chronic pain: Secondary | ICD-10-CM | POA: Insufficient documentation

## 2014-02-02 DIAGNOSIS — E1122 Type 2 diabetes mellitus with diabetic chronic kidney disease: Secondary | ICD-10-CM | POA: Diagnosis present

## 2014-02-02 DIAGNOSIS — R0602 Shortness of breath: Secondary | ICD-10-CM

## 2014-02-02 DIAGNOSIS — Z87898 Personal history of other specified conditions: Secondary | ICD-10-CM

## 2014-02-02 DIAGNOSIS — D649 Anemia, unspecified: Secondary | ICD-10-CM | POA: Insufficient documentation

## 2014-02-02 DIAGNOSIS — I10 Essential (primary) hypertension: Secondary | ICD-10-CM

## 2014-02-02 LAB — URINALYSIS, ROUTINE W REFLEX MICROSCOPIC
Bilirubin Urine: NEGATIVE
Glucose, UA: NEGATIVE mg/dL
Hgb urine dipstick: NEGATIVE
KETONES UR: NEGATIVE mg/dL
NITRITE: NEGATIVE
PROTEIN: NEGATIVE mg/dL
Specific Gravity, Urine: 1.006 (ref 1.005–1.030)
UROBILINOGEN UA: 0.2 mg/dL (ref 0.0–1.0)
pH: 7 (ref 5.0–8.0)

## 2014-02-02 LAB — CBC
HCT: 35.2 % — ABNORMAL LOW (ref 36.0–46.0)
HEMOGLOBIN: 11.7 g/dL — AB (ref 12.0–15.0)
MCH: 29.5 pg (ref 26.0–34.0)
MCHC: 33.2 g/dL (ref 30.0–36.0)
MCV: 88.9 fL (ref 78.0–100.0)
Platelets: 197 10*3/uL (ref 150–400)
RBC: 3.96 MIL/uL (ref 3.87–5.11)
RDW: 12.7 % (ref 11.5–15.5)
WBC: 6 10*3/uL (ref 4.0–10.5)

## 2014-02-02 LAB — BASIC METABOLIC PANEL
Anion gap: 6 (ref 5–15)
BUN: 36 mg/dL — ABNORMAL HIGH (ref 6–23)
CALCIUM: 9.5 mg/dL (ref 8.4–10.5)
CO2: 31 mmol/L (ref 19–32)
Chloride: 98 mEq/L (ref 96–112)
Creatinine, Ser: 1.58 mg/dL — ABNORMAL HIGH (ref 0.50–1.10)
GFR calc Af Amer: 41 mL/min — ABNORMAL LOW (ref >90)
GFR calc non Af Amer: 36 mL/min — ABNORMAL LOW (ref >90)
GLUCOSE: 187 mg/dL — AB (ref 70–99)
Potassium: 4.2 mmol/L (ref 3.5–5.1)
SODIUM: 135 mmol/L (ref 135–145)

## 2014-02-02 LAB — I-STAT TROPONIN, ED: Troponin i, poc: 0 ng/mL (ref 0.00–0.08)

## 2014-02-02 LAB — GLUCOSE, CAPILLARY: GLUCOSE-CAPILLARY: 253 mg/dL — AB (ref 70–99)

## 2014-02-02 LAB — TROPONIN I: Troponin I: <0.03 ng/mL (ref <0.031)

## 2014-02-02 LAB — URINE MICROSCOPIC-ADD ON

## 2014-02-02 LAB — POC URINE PREG, ED: PREG TEST UR: NEGATIVE

## 2014-02-02 LAB — D-DIMER, QUANTITATIVE: D-Dimer, Quant: 0.55 ug/mL-FEU — ABNORMAL HIGH (ref 0.00–0.48)

## 2014-02-02 LAB — CBG MONITORING, ED
GLUCOSE-CAPILLARY: 116 mg/dL — AB (ref 70–99)
Glucose-Capillary: 193 mg/dL — ABNORMAL HIGH (ref 70–99)

## 2014-02-02 MED ORDER — INSULIN DETEMIR 100 UNIT/ML ~~LOC~~ SOLN
10.0000 [IU] | Freq: Two times a day (BID) | SUBCUTANEOUS | Status: DC
  Administered 2014-02-02 – 2014-02-03 (×2): 10 [IU] via SUBCUTANEOUS
  Filled 2014-02-02 (×3): qty 0.1

## 2014-02-02 MED ORDER — SODIUM CHLORIDE 0.9 % IJ SOLN
3.0000 mL | Freq: Two times a day (BID) | INTRAMUSCULAR | Status: DC
  Administered 2014-02-02: 3 mL via INTRAVENOUS

## 2014-02-02 MED ORDER — ACETAMINOPHEN 325 MG PO TABS: 650.0000 mg | ORAL_TABLET | Freq: Four times a day (QID) | ORAL | Status: DC | PRN

## 2014-02-02 MED ORDER — TECHNETIUM TC 99M DIETHYLENETRIAME-PENTAACETIC ACID: 40.0000 | Freq: Once | INTRAVENOUS | Status: AC | PRN

## 2014-02-02 MED ORDER — ASPIRIN EC 81 MG PO TBEC
81.0000 mg | DELAYED_RELEASE_TABLET | Freq: Every day | ORAL | Status: DC
  Administered 2014-02-03: 81 mg via ORAL
  Filled 2014-02-02: qty 1

## 2014-02-02 MED ORDER — TECHNETIUM TO 99M ALBUMIN AGGREGATED
6.0000 | Freq: Once | INTRAVENOUS | Status: AC | PRN
  Administered 2014-02-02: 6 via INTRAVENOUS

## 2014-02-02 MED ORDER — ASPIRIN 325 MG PO TABS
325.0000 mg | ORAL_TABLET | Freq: Once | ORAL | Status: DC
  Filled 2014-02-02: qty 1

## 2014-02-02 MED ORDER — ENALAPRIL MALEATE 10 MG PO TABS
10.0000 mg | ORAL_TABLET | Freq: Every day | ORAL | Status: DC
  Administered 2014-02-03: 10 mg via ORAL
  Filled 2014-02-02: qty 1

## 2014-02-02 MED ORDER — HEPARIN SODIUM (PORCINE) 5000 UNIT/ML IJ SOLN
5000.0000 [IU] | Freq: Three times a day (TID) | INTRAMUSCULAR | Status: DC
  Filled 2014-02-02 (×4): qty 1

## 2014-02-02 MED ORDER — SIMVASTATIN 40 MG PO TABS
40.0000 mg | ORAL_TABLET | Freq: Every day | ORAL | Status: DC
  Administered 2014-02-02 – 2014-02-03 (×2): 40 mg via ORAL
  Filled 2014-02-02 (×2): qty 1

## 2014-02-02 MED ORDER — ASPIRIN 81 MG PO CHEW
243.0000 mg | CHEWABLE_TABLET | Freq: Once | ORAL | Status: AC
  Administered 2014-02-02: 243 mg via ORAL
  Filled 2014-02-02: qty 3

## 2014-02-02 MED ORDER — INSULIN ASPART 100 UNIT/ML ~~LOC~~ SOLN
0.0000 [IU] | Freq: Three times a day (TID) | SUBCUTANEOUS | Status: DC
  Administered 2014-02-03: 1 [IU] via SUBCUTANEOUS

## 2014-02-02 NOTE — ED Notes (Signed)
Pt to nuclear med at this time.

## 2014-02-02 NOTE — H&P (Signed)
Date: 02/02/2014               Patient Name:  [name] MRN: [id number]  DOB: [birth year] Age / Sex: 58 y.o., female   PCP: Bethena Roys, MD         Medical Service: Internal Medicine Teaching Service         Attending Physician: Dr. Axel Filler, MD    First Contact: Dr. Ethelene Hal Pager: [phone]  Second Contact: Dr. Gordy Levan Pager: [phone]       After Hours (After 5p/  First Contact Pager: [phone]  weekends / holidays): Second Contact Pager: [phone]   Chief Complaint: Palpitation  History of Present Illness: [name] is a 58 year old with history of DM2, HTN, HLD, anemia, CKD stage 3, anxiety/depression presenting with palpitations. She reports feeling a "flutter" in her chest last night while setting up her TV. Lasted 5 minutes. She reports she may have had this about a month ago. Denies chest pain. She has chronic SOB. No associated diaphoresis or nausea.   She reports chronic intermittent abdominal pain and chronic lower back pain. Otherwise, she denies fevers, chills, vision changes, cough, N/V, diarrhea, dysuria, hematuria, rash, bleeding/bruising, edema, paresthesias, weakness. No hx of VTE. No recent travel. Denies leg pain.  Meds: No current facility-administered medications for this encounter.   Current Outpatient Prescriptions  Medication Sig Dispense Refill  . acetaminophen (TYLENOL) 325 MG tablet Take 650 mg by mouth every 6 (six) hours as needed for moderate pain.    Marland Kitchen aspirin EC 81 MG tablet Take 81 mg by mouth daily.    . enalapril (VASOTEC) 10 MG tablet TAKE 1 TABLET (10 MG TOTAL) BY MOUTH DAILY. 30 tablet 2  . Insulin Detemir (LEVEMIR FLEXPEN) 100 UNIT/ML Pen Inject 30 Units into the skin 2 (two) times daily. (Patient taking differently: Inject 14-16 Units into the skin 2 (two) times daily. 16 units in the morning and 14 units at bedtime) 15 mL 11  . pioglitazone (ACTOS) 15 MG tablet Take 1 tablet (15 mg total) by mouth daily. 30 tablet 2  .  simvastatin (ZOCOR) 40 MG tablet TAKE 1 TABLET (40 MG TOTAL) BY MOUTH AT BEDTIME. 30 tablet 5  . oxybutynin (DITROPAN) 5 MG tablet Take 1 tablet (5 mg total) by mouth 2 (two) times daily. (Patient not taking: Reported on 01/06/2014) 60 tablet 2  . [DISCONTINUED] pantoprazole (PROTONIX) 20 MG tablet Take 2 tablets (40 mg total) by mouth daily. 30 tablet 1  . [DISCONTINUED] sertraline (ZOLOFT) 100 MG tablet Take 1 tablet (100 mg total) by mouth daily. 30 tablet 2    Allergies: Allergies as of 02/02/2014  . (No Known Allergies)   Past Medical History  Diagnosis Date  . Diabetes mellitus type II, uncontrolled   . Hypertension   . Hyperlipidemia   . Ovarian cyst, left   . Anemia     due to menorrhagia, BL 8-10  . Vaginal cyst     nabothian and bartholin  . CKD (chronic kidney disease) stage 3, GFR 30-59 ml/min     baseline creatinine 1.4-1.7  . Anxiety   . Depression   . Postmenopausal bleeding 06/12/2008  . 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 UTI (lower urinary tract infection)   . IBS (irritable bowel syndrome)   . Chronic back pain   . Chronic abdominal pain   . DDD (degenerative disc disease), lumbar   . Bilateral renal cysts 01/15/2009  Qualifier: Diagnosis of  By: Tyrell Antonio MD, [place]     Past Surgical History  Procedure Laterality Date  . Cardiac surgery      to repair congenital defect as a child   Family History  Problem Relation Age of Onset  . Stroke Father   . Heart attack Father     Had MI in his 1s  . Stomach cancer Paternal Grandmother   . Diabetes Maternal Grandmother   . Cerebral palsy Daughter   . Anesthesia problems Neg Hx   . Hypotension Neg Hx   . Malignant hyperthermia Neg Hx   . Pseudochol deficiency Neg Hx    History   Social History  . Marital Status: Divorced    Spouse Name: N/A    Number of Children: 1  . Years of Education: 12th grade   Occupational History  . 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 Smoking status: Never Smoker   . Smokeless tobacco: Former Systems developer  . Alcohol Use: No  . Drug Use: No  . Sexual Activity: No   Other Topics Concern  . 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    Review of Systems: Constitutional: no fevers/chills Eyes: no vision changes Ears, nose, mouth, throat, and face: no cough Respiratory: +shortness of breath Cardiovascular: no chest pain Gastrointestinal: no nausea/vomiting, +chronic abdominal pain, no constipation, no diarrhea Genitourinary: no dysuria, no hematuria Integument: no rash Hematologic/lymphatic: no bleeding/bruising, no edema Musculoskeletal: no arthralgias, no myalgias Neurological: no paresthesias, no weakness   Physical Exam: Blood pressure 154/74, pulse 99, temperature 98.4 F (36.9 C), temperature source Oral, resp. rate 16, SpO2 97 %. General Apperance: NAD Head: Normocephalic, atraumatic Eyes: PERRL, EOMI, anicteric sclera Ears: Normal external ear canal Nose: Nares normal, septum midline, mucosa normal Throat: Lips, mucosa and tongue normal  Neck: Supple, trachea midline Back: No tenderness or bony abnormality  Lungs: Clear to auscultation bilaterally. No wheezes, rhonchi or rales. Breathing comfortably on RA Chest Wall: Nontender, no deformity Heart: Regular rate and rhythm, no murmur/rub/gallop Abdomen: Soft, nontender, nondistended, no rebound/guarding Extremities: Normal, atraumatic, warm and well perfused, no edema Pulses: 2+ throughout Skin: No rashes or lesions Neurologic: Alert and oriented x 3. CNII-XII intact. Normal strength and sensation   Lab results: Basic Metabolic Panel:  Recent Labs  02/02/14 1130  NA 135  K 4.2  CL 98  CO2 31  GLUCOSE 187*  BUN 36*  CREATININE 1.58*  CALCIUM 9.5   CBC:  Recent Labs  02/02/14 1130  WBC 6.0  HGB 11.7*  HCT 35.2*  MCV 88.9  PLT 197   D-Dimer:  Recent Labs   02/02/14 1130  DDIMER 0.55*   CBG:  Recent Labs  02/02/14 1206  GLUCAP 193*   Urine Drug Screen: Drugs of Abuse  No results found for: LABOPIA, COCAINSCRNUR, LABBENZ, AMPHETMU, THCU, LABBARB  Alcohol Level: No results for input(s): ETH in the last 72 hours. Urinalysis:  Recent Labs  02/02/14 1250  COLORURINE YELLOW  LABSPEC 1.006  PHURINE 7.0  GLUCOSEU NEGATIVE  HGBUR NEGATIVE  BILIRUBINUR NEGATIVE  KETONESUR NEGATIVE  PROTEINUR NEGATIVE  UROBILINOGEN 0.2  NITRITE NEGATIVE  LEUKOCYTESUR TRACE*    Imaging results:  Dg Chest 2 View  02/02/2014   CLINICAL DATA:  Upper left chest pain starting last night, atrial fibrillation  EXAM: CHEST  2 VIEW  COMPARISON:  12/27/2005  FINDINGS: Cardiomediastinal silhouette is  unremarkable. No acute infiltrate or pleural effusion. Degenerative changes lumbar spine. No pulmonary edema.  IMPRESSION: No active cardiopulmonary disease.   Electronically Signed   By: Lahoma Crocker M.D.   On: 02/02/2014 13:09    Other results: EKG: normal sinus rhythm, T wave normalization in V2, otherwise no changes from previous EKG  Assessment & Plan by Problem: Principal Problem:   Chest pain Active Problems:   Diabetes mellitus with renal complications   Anxiety state   Essential hypertension   CKD (chronic kidney disease) stage 3, GFR 30-59 ml/min   Dyspnea  Chest pain, SOB: Pain is not reproducible on palpation making MSK etiology less likely. CXR with no acute cardiopulmonary process. Wells score 0 and Geneva score 3 (heart rate 75-94) making her low risk for PE. D-dimer 0.55. Initial troponin negative and EKG without acute ischemic changes. Cardiology consulted by ED (Dr. Wynonia Lawman) and was told patient will need stress test. She received ASA 234mg  x1 in ED. -UDS pending -VQ scan pending -repeat EKG in AM -trend troponins -tele -continue home ASA 81mg  daily  DM2: Last hgb A1c 9.1 on 01/06/2014. On levemir 16 units QAM and 14 units QPM, actos  15mg  daily at home -sensitive SSI -Levelmir 10units BID -CBG QAC/HS  HTN: normotensive in ED -Continue home enalapril 10mg  daily  HLD -Continue home Zocor 40mg  QHS  Chronic anemia: Hgb 11.7 on admission. Baseline 12. -Continue to monitor  CKD stage 3: Creatinine 1.58 on admission. Baseline around 1.5-2 -Continue to monitor  FEN:  -Heart healthy/carb modified -NPO after midnight  VTE ppx: subq heparin 5000u TID  Dispo: Disposition is deferred at this time, awaiting improvement of current medical problems. Anticipated discharge in approximately 1-2 day(s).   The patient does have a current PCP (Ejiroghene Arlyce Dice, MD) and does need an [facility] hospital follow-up appointment after discharge.  The patient does not have transportation limitations that hinder transportation to clinic appointments.  Signed: Jacques Earthly, MD 02/02/2014, 5:45 PM

## 2014-02-02 NOTE — ED Notes (Signed)
Pt placed on monitor upon return to room from restroom. Pt able to ambulate with no assistance needed. Pt remains monitored by blood pressure and pulse ox. Pts family remains at bedside.

## 2014-02-02 NOTE — ED Notes (Signed)
Pt is here with chest pain and palpitations last nite.  History of DM and heart surgery as a child.

## 2014-02-02 NOTE — ED Notes (Signed)
Pt remains monitored by blood pressure, pulse ox, and 5 lead.  

## 2014-02-02 NOTE — ED Provider Notes (Signed)
CSN: [id number]     Arrival date & time 02/02/14  1020 History   First MD Initiated Contact with Patient 02/02/14 1112     Chief Complaint  Patient presents with  . Chest Pain     (Consider location/radiation/quality/duration/timing/severity/associated sxs/prior Treatment) The history is provided by the patient. No language interpreter was used.  [name] is a 58 y/o F with PMHx of HTN, HLD, DM, CKD, UTI, IBS, chronic back pain and chronic abdominal pain presenting to the ED with chest pain that occurred yesterday. Patient reported that the chest pain was sudden while the patient was trying to connect her television. Patient reported that the chest pain was localized to the left side of the chest described as a flutter, "funny" pain that lasted approximately 5 minutes - stated that she had associated shortness of breath and diaphoresis. Stated that she took 81 mg of ASA this morning at 8:00AM. Stated that she has not experienced pain this morning or at all today. Stated that the shortness of breath has gotten worse - stated that she has increased shortness of breath with exertion. Denied difficulty breathing, dizziness, blurred vision, sudden loss of vision, numbness, tingling, weakness, jaw pain, travels, leg swelling, syncope. PCP Dr. Denton Brick   Past Medical History  Diagnosis Date  . Diabetes mellitus type II, uncontrolled   . Hypertension   . Hyperlipidemia   . Ovarian cyst, left   . Anemia     due to menorrhagia, BL 8-10  . Vaginal cyst     nabothian and bartholin  . CKD (chronic kidney disease) stage 3, GFR 30-59 ml/min     baseline creatinine 1.4-1.7  . Anxiety   . Depression   . Postmenopausal bleeding 06/12/2008  . 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 UTI (lower urinary tract infection)   . IBS (irritable bowel syndrome)   . Chronic back pain   . Chronic abdominal pain   . DDD (degenerative disc disease), lumbar   . Bilateral renal cysts  01/15/2009    Qualifier: Diagnosis of  By: Tyrell Antonio MD, Jerald Kief     Past Surgical History  Procedure Laterality Date  . Cardiac surgery      to repair congenital defect as a child   Family History  Problem Relation Age of Onset  . Stroke Father   . Heart attack Father     Had MI in his 73s  . Stomach cancer Paternal Grandmother   . Diabetes Maternal Grandmother   . Cerebral palsy Daughter   . Anesthesia problems Neg Hx   . Hypotension Neg Hx   . Malignant hyperthermia Neg Hx   . Pseudochol deficiency Neg Hx    History  Substance Use Topics  . Smoking status: Never Smoker   . Smokeless tobacco: Former Systems developer  . Alcohol Use: No   OB History    Gravida Para Term Preterm AB TAB SAB Ectopic Multiple Living   3 1 1  2  2   1      Review of Systems  Constitutional: Negative for fever and chills.  Eyes: Negative for visual disturbance.  Respiratory: Positive for shortness of breath. Negative for cough and chest tightness.   Cardiovascular: Positive for chest pain.  Neurological: Negative for dizziness, weakness, numbness and headaches.      Allergies  Review of patient's allergies indicates no known allergies.  Home Medications   Prior to Admission medications   Medication Sig Start Date End  Date Taking? Authorizing Provider  acetaminophen (TYLENOL) 325 MG tablet Take 650 mg by mouth every 6 (six) hours as needed for moderate pain.   Yes Historical Provider, MD  aspirin EC 81 MG tablet Take 81 mg by mouth daily.   Yes Historical Provider, MD  enalapril (VASOTEC) 10 MG tablet TAKE 1 TABLET (10 MG TOTAL) BY MOUTH DAILY. 12/16/13  Yes [name], MD  Insulin Detemir (LEVEMIR FLEXPEN) 100 UNIT/ML Pen Inject 30 Units into the skin 2 (two) times daily. Patient taking differently: Inject 14-16 Units into the skin 2 (two) times daily. 16 units in the morning and 14 units at bedtime 01/18/14  Yes [name], MD  pioglitazone (ACTOS) 15 MG tablet Take 1 tablet (15 mg  total) by mouth daily. 01/02/14  Yes [name], MD  simvastatin (ZOCOR) 40 MG tablet TAKE 1 TABLET (40 MG TOTAL) BY MOUTH AT BEDTIME. 11/26/13  Yes [name], MD  oxybutynin (DITROPAN) 5 MG tablet Take 1 tablet (5 mg total) by mouth 2 (two) times daily. Patient not taking: Reported on 01/06/2014 12/06/13   Marjan Rabbani, MD   BP 143/70 mmHg  Pulse 88  Temp(Src) 98.4 F (36.9 C) (Oral)  Resp 15  SpO2 100% Physical Exam  Constitutional: She is oriented to person, place, and time. She appears well-developed and well-nourished. No distress.  HENT:  Head: Normocephalic and atraumatic.  Mouth/Throat: Oropharynx is clear and moist. No oropharyngeal exudate.  Eyes: Conjunctivae and EOM are normal. Pupils are equal, round, and reactive to light. Right eye exhibits no discharge. Left eye exhibits no discharge.  Neck: Normal range of motion. Neck supple. No tracheal deviation present.  Cardiovascular: Normal rate, regular rhythm and normal heart sounds.  Exam reveals no friction rub.   No murmur heard. Pulmonary/Chest: Effort normal and breath sounds normal. No respiratory distress. She has no wheezes. She has no rales.  Musculoskeletal: Normal range of motion.  Full ROM to upper and lower extremities without difficulty noted, negative ataxia noted.  Lymphadenopathy:    She has no cervical adenopathy.  Neurological: She is alert and oriented to person, place, and time. No cranial nerve deficit. She exhibits normal muscle tone. Coordination normal.  Cranial nerves III-XII grossly intact Strength 5+/5+ to upper and lower extremities bilaterally with resistance applied, equal distribution noted Equal grip strength bilaterally Negative facial droop Negative slurred speech Negative aphasia Negative arm drift Fine motor skills intact Patient follows commands well  Patient responds to questions appropriately   Skin: Skin is warm and dry. No rash noted. She is not diaphoretic.  No erythema.  Psychiatric: She has a normal mood and affect. Her behavior is normal. Thought content normal.  Nursing note and vitals reviewed.   ED Course  Procedures (including critical care time)  Results for orders placed or performed during the hospital encounter of 02/02/14  CBC  Result Value Ref Range   WBC 6.0 4.0 - 10.5 K/uL   RBC 3.96 3.87 - 5.11 MIL/uL   Hemoglobin 11.7 (L) 12.0 - 15.0 g/dL   HCT 35.2 (L) 36.0 - 46.0 %   MCV 88.9 78.0 - 100.0 fL   MCH 29.5 26.0 - 34.0 pg   MCHC 33.2 30.0 - 36.0 g/dL   RDW 12.7 11.5 - 15.5 %   Platelets 197 150 - 400 K/uL  Basic metabolic panel  Result Value Ref Range   Sodium 135 135 - 145 mmol/L   Potassium 4.2 3.5 - 5.1 mmol/L  Chloride 98 96 - 112 mEq/L   CO2 31 19 - 32 mmol/L   Glucose, Bld 187 (H) 70 - 99 mg/dL   BUN 36 (H) 6 - 23 mg/dL   Creatinine, Ser 1.58 (H) 0.50 - 1.10 mg/dL   Calcium 9.5 8.4 - 10.5 mg/dL   GFR calc non Af Amer 36 (L) >90 mL/min   GFR calc Af Amer 41 (L) >90 mL/min   Anion gap 6 5 - 15  Urinalysis, Routine w reflex microscopic  Result Value Ref Range   Color, Urine YELLOW YELLOW   APPearance CLEAR CLEAR   Specific Gravity, Urine 1.006 1.005 - 1.030   pH 7.0 5.0 - 8.0   Glucose, UA NEGATIVE NEGATIVE mg/dL   Hgb urine dipstick NEGATIVE NEGATIVE   Bilirubin Urine NEGATIVE NEGATIVE   Ketones, ur NEGATIVE NEGATIVE mg/dL   Protein, ur NEGATIVE NEGATIVE mg/dL   Urobilinogen, UA 0.2 0.0 - 1.0 mg/dL   Nitrite NEGATIVE NEGATIVE   Leukocytes, UA TRACE (A) NEGATIVE  D-dimer, quantitative  Result Value Ref Range   D-Dimer, Quant 0.55 (H) 0.00 - 0.48 ug/mL-FEU  Urine microscopic-add on  Result Value Ref Range   Squamous Epithelial / LPF RARE RARE   WBC, UA 0-2 <3 WBC/hpf  I-stat troponin, ED (not at [facility])  Result Value Ref Range   Troponin i, poc 0.00 0.00 - 0.08 ng/mL   Comment 3          CBG monitoring, ED  Result Value Ref Range   Glucose-Capillary 193 (H) 70 - 99 mg/dL  POC urine preg, ED (not  at [facility])  Result Value Ref Range   Preg Test, Ur NEGATIVE NEGATIVE    Labs Review Labs Reviewed  CBC - Abnormal; Notable for the following:    Hemoglobin 11.7 (*)    HCT 35.2 (*)    All other components within normal limits  BASIC METABOLIC PANEL - Abnormal; Notable for the following:    Glucose, Bld 187 (*)    BUN 36 (*)    Creatinine, Ser 1.58 (*)    GFR calc non Af Amer 36 (*)    GFR calc Af Amer 41 (*)    All other components within normal limits  URINALYSIS, ROUTINE W REFLEX MICROSCOPIC - Abnormal; Notable for the following:    Leukocytes, UA TRACE (*)    All other components within normal limits  D-DIMER, QUANTITATIVE - Abnormal; Notable for the following:    D-Dimer, Quant 0.55 (*)    All other components within normal limits  CBG MONITORING, ED - Abnormal; Notable for the following:    Glucose-Capillary 193 (*)    All other components within normal limits  URINE MICROSCOPIC-ADD ON  Randolm Idol, ED  POC URINE PREG, ED    Imaging Review Dg Chest 2 View  02/02/2014   CLINICAL DATA:  Upper left chest pain starting last night, atrial fibrillation  EXAM: CHEST  2 VIEW  COMPARISON:  12/27/2005  FINDINGS: Cardiomediastinal silhouette is unremarkable. No acute infiltrate or pleural effusion. Degenerative changes lumbar spine. No pulmonary edema.  IMPRESSION: No active cardiopulmonary disease.   Electronically Signed   By: Lahoma Crocker M.D.   On: 02/02/2014 13:09     EKG Interpretation   Date/Time:  Sunday February 02 2014 10:33:01 EST Ventricular Rate:  87 PR Interval:  158 QRS Duration: 118 QT Interval:  374 QTC Calculation: 450 R Axis:   24 Text Interpretation:  Normal sinus rhythm Right bundle branch block  Artifact  When compared with ECG of 11/13/2013 No significant change was  found Confirmed by [facility]  MD, Nunzio Cory [phone]) on 02/02/2014 12:05:30  PM       2:29 PM This provider spoke with Dr. Wynonia Lawman, Cardiologist - as per physician reported that patient  will need a stress test. Recommended Internal Medicine admission.   2:46 PM This provider spoke with the patient. Patient reported that she is not sure she would like to stay. Daughter at bedside reported that she does not want mother to stay.   3:17 PM This provider discussed admission again with the patient. Patient reported that she has not come up with a decision.  Discussed with patient numerous times reason for admission and concern of chest pain - patient reported that she would like to speak with her primary care providers before making a decision.   4:23 PM This provider spoke with Dr. Redmond Pulling, Internal Medicine Teaching Services, discussed case in great detail, labs, imaging, vitals, ED course. Patient to be admitted for VQ scan to be performed and for patient to be admitted for cardiac rule out.   5:13 PM This provider spoke with Adam from Nuclear Medicine, Tech to be called in to get VQ scan performed.   5:31 PM Patient seen and assessed by Internal Medicine Teaching Services, patient agreed to admission.   MDM   Final diagnoses:  Elevated d-dimer  Chest pain, unspecified chest pain type    Medications  aspirin chewable tablet 243 mg (243 mg Oral Given 02/02/14 1344)    Filed Vitals:   02/02/14 1415 02/02/14 1430 02/02/14 1445 02/02/14 1500  BP: 133/67 133/71 165/74 143/70  Pulse: 84 86  88  Temp:      TempSrc:      Resp: 14 16 18 15   SpO2: 100% 99%  100%    EKG noted normal sinus rhythm with a right bundle branch block, heart rate 87 bpm-no skin change since last tracing. I-STAT troponin negative elevation. D-dimer elevated at 0.55. CBC unremarkable-hemoglobin 11.7, hematocrit 35.2. BMP noted elevated BUN of 36, grand 1.58-patient has history of chronic kidney disease - from previous labs patient's creatinine has been as high as 2.03. CBG 183. Urine pregnancy negative. Urinalysis negative signs of infection. Chest x-ray negative for acute cardia pulmonary  disease. D-dimer elevated, will need to get VQ scan secondary to elevated creatinine and poor kidney function.  Patient reported that she took one baby ASA today - ASA given while in the ED setting. Had a long discussion with patient regarding concern of chest pain with age and past medical history being a risk factors. Patient has never been admitted to the hospital for cardiac rule out. Cardiology consulted and reported that patient will most likely need a stress test to be performed. Internal Medicine teaching Services consulted and going to admit for cardiac rule out. Cardiology consulted, but recommended Internal Medicine to see and call them if needed. Patient stable for transfer.   Jamse Mead, PA-C 02/02/14 [place], DO 02/05/14 1037

## 2014-02-02 NOTE — ED Notes (Signed)
CBG 193 reported to [facility]

## 2014-02-02 NOTE — ED Notes (Signed)
Patient transported to X-ray without distress.  

## 2014-02-02 NOTE — ED Notes (Signed)
Pt ambulated to the restroom with no assistance needed. Pt placed on monitor upon return to room. Pt remains monitored by blood pressure, pulse ox, and 5 lead. pts family remains at bedside.

## 2014-02-02 NOTE — ED Notes (Signed)
Pt requested CBG check, resulted 116.

## 2014-02-03 DIAGNOSIS — I359 Nonrheumatic aortic valve disorder, unspecified: Secondary | ICD-10-CM

## 2014-02-03 DIAGNOSIS — R002 Palpitations: Secondary | ICD-10-CM

## 2014-02-03 DIAGNOSIS — R791 Abnormal coagulation profile: Secondary | ICD-10-CM

## 2014-02-03 LAB — TROPONIN I
Troponin I: <0.03 ng/mL (ref <0.031)
Troponin I: <0.03 ng/mL (ref <0.031)

## 2014-02-03 LAB — BASIC METABOLIC PANEL
ANION GAP: 7 (ref 5–15)
BUN: 33 mg/dL — ABNORMAL HIGH (ref 6–23)
CALCIUM: 9.8 mg/dL (ref 8.4–10.5)
CHLORIDE: 103 meq/L (ref 96–112)
CO2: 31 mmol/L (ref 19–32)
Creatinine, Ser: 1.52 mg/dL — ABNORMAL HIGH (ref 0.50–1.10)
GFR calc non Af Amer: 37 mL/min — ABNORMAL LOW (ref >90)
GFR, EST AFRICAN AMERICAN: 43 mL/min — AB (ref >90)
Glucose, Bld: 62 mg/dL — ABNORMAL LOW (ref 70–99)
Potassium: 4.1 mmol/L (ref 3.5–5.1)
SODIUM: 141 mmol/L (ref 135–145)

## 2014-02-03 LAB — GLUCOSE, CAPILLARY
GLUCOSE-CAPILLARY: 128 mg/dL — AB (ref 70–99)
Glucose-Capillary: 90 mg/dL (ref 70–99)

## 2014-02-03 LAB — CBC
HEMATOCRIT: 36.5 % (ref 36.0–46.0)
HEMOGLOBIN: 12.1 g/dL (ref 12.0–15.0)
MCH: 29.4 pg (ref 26.0–34.0)
MCHC: 33.2 g/dL (ref 30.0–36.0)
MCV: 88.8 fL (ref 78.0–100.0)
Platelets: 217 10*3/uL (ref 150–400)
RBC: 4.11 MIL/uL (ref 3.87–5.11)
RDW: 12.8 % (ref 11.5–15.5)
WBC: 7.5 10*3/uL (ref 4.0–10.5)

## 2014-02-03 NOTE — Progress Notes (Signed)
UR completed 

## 2014-02-03 NOTE — Progress Notes (Signed)
VASCULAR LAB PRELIMINARY  PRELIMINARY  PRELIMINARY  PRELIMINARY  BLEV doppler completed.    Preliminary report: NEG for DVT bilateral lower extremities  Lindwood Coke, RVT 02/03/2014, 1:47 PM   .

## 2014-02-03 NOTE — Discharge Summary (Signed)
Name: [name] MRN: [id number] DOB: [birth year] 58 y.o. PCP: Bethena Roys, MD  Date of Admission: 02/02/2014 11:08 AM Date of Discharge: 02/03/2014 Attending Physician: No att. providers found  Discharge Diagnosis:  Principal Problem:   Chest pain Active Problems:   Diabetes mellitus with renal complications   Anxiety state   Essential hypertension   CKD (chronic kidney disease) stage 3, GFR 30-59 ml/min   Dyspnea  Discharge Medications:   Medication List    TAKE these medications        acetaminophen 325 MG tablet  Commonly known as:  TYLENOL  Take 650 mg by mouth every 6 (six) hours as needed for moderate pain.     aspirin EC 81 MG tablet  Take 81 mg by mouth daily.     enalapril 10 MG tablet  Commonly known as:  VASOTEC  TAKE 1 TABLET (10 MG TOTAL) BY MOUTH DAILY.     Insulin Detemir 100 UNIT/ML Pen  Commonly known as:  LEVEMIR FLEXPEN  Inject 30 Units into the skin 2 (two) times daily.     oxybutynin 5 MG tablet  Commonly known as:  DITROPAN  Take 1 tablet (5 mg total) by mouth 2 (two) times daily.     pioglitazone 15 MG tablet  Commonly known as:  ACTOS  Take 1 tablet (15 mg total) by mouth daily.     simvastatin 40 MG tablet  Commonly known as:  ZOCOR  TAKE 1 TABLET (40 MG TOTAL) BY MOUTH AT BEDTIME.        Disposition and follow-up:   [name] was discharged from [facility] in good condition.  At the hospital follow up visit please address:  1.  Any further palpitations, echo results  2.  Labs / imaging needed at time of follow-up: none  3.  Pending labs/ test needing follow-up: echo  Follow-up Appointments: Follow-up Information    Follow up with Nahser, Wonda Cheng, MD On 02/13/2014.   Specialty:  Cardiology   Why:  7:30AM   Contact information:   [address] [place] [address] [phone]       Follow up with Michail Jewels, MD On 02/14/2014.   Specialty:  Internal Medicine   Why:  10:15AM   Contact information:   [place] [address] [phone]       Discharge Instructions: Discharge Instructions    Diet - low sodium heart healthy    Complete by:  As directed      Increase activity slowly    Complete by:  As directed            Consultations: Treatment Team:  Rounding Lbcardiology, MD  Procedures Performed:  Dg Chest 2 View  02/02/2014   CLINICAL DATA:  Upper left chest pain starting last night, atrial fibrillation  EXAM: CHEST  2 VIEW  COMPARISON:  12/27/2005  FINDINGS: Cardiomediastinal silhouette is unremarkable. No acute infiltrate or pleural effusion. Degenerative changes lumbar spine. No pulmonary edema.  IMPRESSION: No active cardiopulmonary disease.   Electronically Signed   By: Lahoma Crocker M.D.   On: 02/02/2014 13:09   Nm Pulmonary Perf And Vent  02/02/2014   CLINICAL DATA:  Elevated D-dimer.  Chest pain and palpitations.  EXAM: NUCLEAR MEDICINE VENTILATION - PERFUSION LUNG SCAN  TECHNIQUE: Ventilation images were obtained in multiple projections using inhaled aerosol technetium 99 M DTPA. Perfusion images were obtained in multiple projections after intravenous injection of Tc-26m MAA.  RADIOPHARMACEUTICALS:  40 mCi Tc-43m DTPA aerosol and 6 mCi Tc-49m MAA  COMPARISON:  Chest radiograph 02/02/2014  FINDINGS: Ventilation: Patchy uptake in the lungs without gross abnormality.  Perfusion: Moderate sized periphery defect in the posterior left upper lung appears to represent a matched defect. Similarly, there are areas of decreased uptake along the anterior right lung which represent matched defects. No significant ventilation-perfusion mismatch.  IMPRESSION: Low probability for pulmonary embolism.   Electronically Signed   By: Markus Daft M.D.   On: 02/02/2014 19:23    2D Echo:  -Left ventricle: The cavity size was normal. Wall thickness was normal. Systolic function was normal. The estimated ejection fraction was in the range of  60% to 65%. Wall motion was normal; there were no regional wall motion abnormalities. Doppler parameters are consistent with abnormal left ventricular relaxation (grade 1 diastolic dysfunction). - Aortic valve: There was mild stenosis. Valve area (VTI): 1.43 cm^2. Valve area (Vmax): 1.07 cm^2. Valve area (Vmean): 1.07 cm^2.  LE Doppler: negative for DVT bilateral lower extremities  EKG: Normal sinus rhythm; right bundle branch block; no significant change  Admission HPI: [name] is a 58 year old with history of DM2, HTN, HLD, anemia, CKD stage 3, anxiety/depression presenting with palpitations. She reports feeling a "flutter" in her chest last night while setting up her TV. Lasted 5 minutes. She reports she may have had this about a month ago. Denies chest pain. She has chronic SOB. No associated diaphoresis or nausea.   She reports chronic intermittent abdominal pain and chronic lower back pain. Otherwise, she denies fevers, chills, vision changes, cough, N/V, diarrhea, dysuria, hematuria, rash, bleeding/bruising, edema, paresthesias, weakness. No hx of VTE. No recent travel. Denies leg pain.  Hospital Course by problem list: Principal Problem:   Chest pain Active Problems:   Diabetes mellitus with renal complications   Anxiety state   Essential hypertension   CKD (chronic kidney disease) stage 3, GFR 30-59 ml/min   Dyspnea   #Palpitations: [name] did not have any recurrence of her palpitations and denied any chest pain, SOB, nausea, diaphoresis. She was seen by cardiology who agreed given Ms Bale's history of infant cardiac surgery for structural abnormalities an echo is reasonable. This was notable for grade 1 diastolic dysfunction but likely non-contributory. The results were returned after discharge and should be shared with her on out-patient follow-up. Cardiology feels that ACS is unlikely given negative troponins and unchanged EKG and cleared her for  discharge. PE seems unlikely given Wells score 0 and Geneva 3 (no tachycardia, chest pain, leg swelling, travel, hormone therapy, recent surgery). Her D-dimer was elevated and VQ scan showed low probability. LE Doppler was negative. She was continued on her home ASA 81 mg daily and zocor 40 mg qhs. She will have cardiology out-patient follow-up.  #DM2: Ms Norbeck last hgb A1c was 9.1 on 01/06/2014. She is on levemir 16 units QAM and 14 units QPM, actos 15mg  daily at home. During her stay she was on sensitive SSI and levemir 10 u BID.  #HTN: Ms Brossart was normotensive and continued on her home enalapril 10mg  daily.  #HLD: Ms Tunks was continued on her home Zocor 40mg  QHS  #Chronic anemia: Ms Kleiman presented with a Hgb of 11.7 on admission which was up to 12.1 the morning of discharge. Her baseline hemoglobin is about 12.  #CKD stage 3: Ms Dubberly's creatinine was 1.58 on admission and stable on morning of discharge at 1.52. Baseline around 1.5-2  Discharge Vitals:  BP 97/58 mmHg  Pulse 74  Temp(Src) 98.1 F (36.7 C) (Oral)  Resp 17  Ht 5\' 4"  (1.626 m)  Wt 158 lb 4.6 oz (71.8 kg)  BMI 27.16 kg/m2  SpO2 100%  Discharge Physical Exam: Gen: A&O x 4, no acute distress, well developed, well nourished, TTE imaging during exam HEENT: Atraumatic, PERRL, EOMI, sclerae anicteric, moist mucous membranes Neck: No carotid bruits or JVD Heart: Regular rate and rhythm, normal S1 S2, faint systolic murmur, no rubs or gallops Lungs: Clear to auscultation bilaterally, respirations unlabored Abd: Soft, non-tender, non-distended, + bowel sounds, no hepatosplenomegaly Ext: No edema or cyanosis, no leg swelling or tenderness  Discharge Labs:  Basic Metabolic Panel:  Recent Labs  02/02/14 1130 02/03/14 0900  NA 135 141  K 4.2 4.1  CL 98 103  CO2 31 31  GLUCOSE 187* 62*  BUN 36* 33*  CREATININE 1.58* 1.52*  CALCIUM 9.5 9.8   CBC:  Recent Labs  02/02/14 1130 02/03/14 0900  WBC 6.0  7.5  HGB 11.7* 12.1  HCT 35.2* 36.5  MCV 88.9 88.8  PLT 197 217   Cardiac Enzymes:  Recent Labs  02/02/14 1920 02/03/14 0110 02/03/14 0900  TROPONINI <0.03 <0.03 <0.03   D-Dimer:  Recent Labs  02/02/14 1130  DDIMER 0.55*   CBG:  Recent Labs  02/02/14 1206 02/02/14 1923 02/02/14 2216 02/03/14 0644 02/03/14 1051  GLUCAP 193* 116* 253* 128* 90   Urinalysis:  Recent Labs  02/02/14 1250  COLORURINE YELLOW  LABSPEC 1.006  PHURINE 7.0  GLUCOSEU NEGATIVE  HGBUR NEGATIVE  BILIRUBINUR NEGATIVE  KETONESUR NEGATIVE  PROTEINUR NEGATIVE  UROBILINOGEN 0.2  NITRITE NEGATIVE  LEUKOCYTESUR TRACE*     Signed: Kelby Aline, MD 02/03/2014, 7:32 PM    Services Ordered on Discharge: none Equipment Ordered on Discharge: none

## 2014-02-03 NOTE — Consult Note (Addendum)
CONSULT NOTE  Date: 02/03/2014               Patient Name:  [name] MRN: [id number]  DOB: 08-Mar-1955 Age / Sex: 58 y.o., female        PCP: Jenetta Downer Primary Cardiologist: New / Nahser            Referring Physician: Marinda Elk              Reason for Consult:  palpitations, chest pain            History of Present Illness: Patient is a 58 y.o. female with a PMHx of DM2, HTN, HLD, anemia, CKD stage 3, anxiety/depression , who was admitted to [facility] on 02/02/2014 for evaluation of "fluttering in her chest .   She has a hx of heart surgery at age 73 months at [facility].  She does not know what she had. She has not had any symptoms related to her heart.   Denies any exertional CP or dyspnea.   upper, mid sternal .  fluttering , no CP , no dyspnea.  Denies any PND or orthopnea Denies any leg pain or swelling  Lasted  few seconds - may be 5 minutes   while watching TV in the evening  not assoicated with any dizziness    Medications: Outpatient medications: Prescriptions prior to admission  Medication Sig Dispense Refill Last Dose  . acetaminophen (TYLENOL) 325 MG tablet Take 650 mg by mouth every 6 (six) hours as needed for moderate pain.   Past Week at Unknown time  . aspirin EC 81 MG tablet Take 81 mg by mouth daily.   02/02/2014 at Unknown time  . enalapril (VASOTEC) 10 MG tablet TAKE 1 TABLET (10 MG TOTAL) BY MOUTH DAILY. 30 tablet 2 02/01/2014 at Unknown time  . Insulin Detemir (LEVEMIR FLEXPEN) 100 UNIT/ML Pen Inject 30 Units into the skin 2 (two) times daily. (Patient taking differently: Inject 14-16 Units into the skin 2 (two) times daily. 16 units in the morning and 14 units at bedtime) 15 mL 11 02/01/2014 at Unknown time  . pioglitazone (ACTOS) 15 MG tablet Take 1 tablet (15 mg total) by mouth daily. 30 tablet 2 02/01/2014 at Unknown time  . simvastatin (ZOCOR) 40 MG tablet TAKE 1 TABLET (40 MG TOTAL) BY MOUTH AT BEDTIME. 30 tablet 5 02/01/2014 at  Unknown time  . oxybutynin (DITROPAN) 5 MG tablet Take 1 tablet (5 mg total) by mouth 2 (two) times daily. (Patient not taking: Reported on 01/06/2014) 60 tablet 2 Not Taking    Current medications: Current Facility-Administered Medications  Medication Dose Route Frequency Provider Last Rate Last Dose  . acetaminophen (TYLENOL) tablet 650 mg  650 mg Oral Q6H PRN Francesca Oman, DO      . aspirin EC tablet 81 mg  81 mg Oral Daily Francesca Oman, DO      . enalapril (VASOTEC) tablet 10 mg  10 mg Oral Daily Francesca Oman, DO      . heparin injection 5,000 Units  5,000 Units Subcutaneous 3 times per day Francesca Oman, DO   5,000 Units at 02/02/14 2358  . insulin aspart (novoLOG) injection 0-9 Units  0-9 Units Subcutaneous TID WC Francesca Oman, DO   1 Units at 02/03/14 2426  . insulin detemir (LEVEMIR) injection 10 Units  10 Units Subcutaneous BID Francesca Oman, DO   10 Units at 02/02/14 2355  . simvastatin (ZOCOR)  tablet 40 mg  40 mg Oral q1800 Francesca Oman, DO   40 mg at 02/02/14 2356  . sodium chloride 0.9 % injection 3 mL  3 mL Intravenous Q12H Francesca Oman, DO   3 mL at 02/02/14 2200     No Known Allergies   Past Medical History  Diagnosis Date  . Diabetes mellitus type II, uncontrolled   . Hypertension   . Hyperlipidemia   . Ovarian cyst, left   . Anemia     due to menorrhagia, BL 8-10  . Vaginal cyst     nabothian and bartholin  . CKD (chronic kidney disease) stage 3, GFR 30-59 ml/min     baseline creatinine 1.4-1.7  . Anxiety   . Depression   . Postmenopausal bleeding 06/12/2008  . 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 UTI (lower urinary tract infection)   . IBS (irritable bowel syndrome)   . Chronic back pain   . Chronic abdominal pain   . DDD (degenerative disc disease), lumbar   . Bilateral renal cysts 01/15/2009    Qualifier: Diagnosis of  By: Tyrell Antonio MD, Jerald Kief      Past Surgical History  Procedure Laterality Date  . Cardiac surgery       to repair congenital defect as a child    Family History  Problem Relation Age of Onset  . Stroke Father   . Heart attack Father     Had MI in his 55s  . Stomach cancer Paternal Grandmother   . Diabetes Maternal Grandmother   . Cerebral palsy Daughter   . Anesthesia problems Neg Hx   . Hypotension Neg Hx   . Malignant hyperthermia Neg Hx   . Pseudochol deficiency Neg Hx     Social History:  reports that she has never smoked. She has quit using smokeless tobacco. She reports that she does not drink alcohol or use illicit drugs.   Review of Systems: Constitutional:  denies fever, chills, diaphoresis, appetite change and fatigue.  HEENT: denies photophobia, eye pain, redness, hearing loss, ear pain, congestion, sore throat, rhinorrhea, sneezing, neck pain, neck stiffness and tinnitus.  Respiratory: denies SOB, DOE, cough, chest tightness, and wheezing.  Cardiovascular: denies chest pain, palpitations and leg swelling.  Gastrointestinal: denies nausea, vomiting, abdominal pain, diarrhea, constipation, blood in stool.  Genitourinary: denies dysuria, urgency, frequency, hematuria, flank pain and difficulty urinating.  Musculoskeletal: denies  myalgias, back pain, joint swelling, arthralgias and gait problem.   Skin: denies pallor, rash and wound.  Neurological: denies dizziness, seizures, syncope, weakness, light-headedness, numbness and headaches.   Hematological: denies adenopathy, easy bruising, personal or family bleeding history.  Psychiatric/ Behavioral: denies suicidal ideation, mood changes, confusion, nervousness, sleep disturbance and agitation.    Physical Exam: BP 100/54 mmHg  Pulse 70  Temp(Src) 98.1 F (36.7 C) (Oral)  Resp 16  Ht 5\' 4"  (1.626 m)  Wt 158 lb 4.6 oz (71.8 kg)  BMI 27.16 kg/m2  SpO2 99%  Wt Readings from Last 3 Encounters:  02/03/14 158 lb 4.6 oz (71.8 kg)  01/17/14 162 lb (73.483 kg)  01/06/14 160 lb (72.576 kg)    General: Vital signs  reviewed and noted. Well-developed, well-nourished, in no acute distress; alert,   Head: Normocephalic, atraumatic, sclera anicteric,   Neck: Supple. Negative for carotid bruits. No JVD   Lungs:  Clear bilaterally, no  wheezes, rales, or rhonchi. Breathing is normal   Heart: RRR with S1 S2. Soft  systolic murmur no , rubs, or gallops   Abdomen:  Soft, non-tender, non-distended with normoactive bowel sounds. No hepatomegaly. No rebound/guarding. No obvious abdominal masses   MSK: Strength and the appear normal for age.   Extremities: No clubbing or cyanosis. No edema.  Distal pedal pulses are 2+ and equal   Neurologic: Alert and oriented X 3. Moves all extremities spontaneously.  Psych: Responds to questions appropriately with a normal affect.     Lab results: Basic Metabolic Panel:  Recent Labs Lab 02/02/14 1130 02/03/14 0900  NA 135 141  K 4.2 4.1  CL 98 103  CO2 31 31  GLUCOSE 187* 62*  BUN 36* 33*  CREATININE 1.58* 1.52*  CALCIUM 9.5 9.8    CBC:  Recent Labs Lab 02/02/14 1130 02/03/14 0900  WBC 6.0 7.5  HGB 11.7* 12.1  HCT 35.2* 36.5  MCV 88.9 88.8  PLT 197 217    Cardiac Enzymes:  Recent Labs Lab 02/02/14 1920 02/03/14 0110 02/03/14 0900  TROPONINI <0.03 <0.03 <0.03    BNP: Invalid input(s): POCBNP  CBG:  Recent Labs Lab 02/02/14 1206 02/02/14 1923 02/02/14 2216 02/03/14 0644  GLUCAP 193* 116* 253* 128*    Coagulation Studies: No results for input(s): LABPROT, INR in the last 72 hours.   Other results:  EKG :  NSR , RBBB.  Tele:  NSR   Imaging: Dg Chest 2 View  02/02/2014   CLINICAL DATA:  Upper left chest pain starting last night, atrial fibrillation  EXAM: CHEST  2 VIEW  COMPARISON:  12/27/2005  FINDINGS: Cardiomediastinal silhouette is unremarkable. No acute infiltrate or pleural effusion. Degenerative changes lumbar spine. No pulmonary edema.  IMPRESSION: No active cardiopulmonary disease.   Electronically Signed   By: Lahoma Crocker  M.D.   On: 02/02/2014 13:09   Nm Pulmonary Perf And Vent  02/02/2014   CLINICAL DATA:  Elevated D-dimer.  Chest pain and palpitations.  EXAM: NUCLEAR MEDICINE VENTILATION - PERFUSION LUNG SCAN  TECHNIQUE: Ventilation images were obtained in multiple projections using inhaled aerosol technetium 99 M DTPA. Perfusion images were obtained in multiple projections after intravenous injection of Tc-46m MAA.  RADIOPHARMACEUTICALS:  40 mCi Tc-13m DTPA aerosol and 6 mCi Tc-27m MAA  COMPARISON:  Chest radiograph 02/02/2014  FINDINGS: Ventilation: Patchy uptake in the lungs without gross abnormality.  Perfusion: Moderate sized periphery defect in the posterior left upper lung appears to represent a matched defect. Similarly, there are areas of decreased uptake along the anterior right lung which represent matched defects. No significant ventilation-perfusion mismatch.  IMPRESSION: Low probability for pulmonary embolism.   Electronically Signed   By: Markus Daft M.D.   On: 02/02/2014 19:23      Assessment & Plan:  1. Palpitations: The patient describes palpitations that lasted for perhaps several seconds. She thinks that maybe she felt a little uneasy for about 5 minutes. She specifically denies any chest pain or shortness of breath.  She did not have any dizziness during the palpitations. She denied any other symptoms.  She has not had any arrhythmias on monitor.  We'll get an echocardiogram for further assessment of her LV function. In addition, she's had previous cardiac surgery at age 75 months at [facility]. She does not know what type of surgery she had.  I suggest that she ambulate in the halls this am.  Will get an echo ( already ordered )   I anticipate that she will be able to go home later today. troponins are negative.  Symptoms do not sound like acute coronary syndrome.  We can see her in the office as OP.     Thayer Headings, Brooke Bonito., MD, [facility] 02/03/2014, 10:16 AM Office -  [phone] Pager [phone]

## 2014-02-03 NOTE — Progress Notes (Signed)
Pt discharged home  Discharge instructions given & reviewed Eduction discussed  IV dc'd  Tele dc'd  Pt discharged via wheelchair with RN, all pt belongs at side.  Sherrie Mustache 6:38 PM

## 2014-02-03 NOTE — Progress Notes (Signed)
Subjective: [name] had NAEON. She said she has not had the sensation in her chest recur since she has been at the hospital. She denies any chest pain, palpitations, SOB, nausea, diaphoresis or any other complaints. She says she did not want to come to the hospital but her daughter insisted.  Objective: Vital signs in last 24 hours: Filed Vitals:   02/02/14 1930 02/02/14 2004 02/03/14 0434 02/03/14 1052  BP: 134/56 117/78 100/54 126/68  Pulse: 82 82 70   Temp:  97.8 F (36.6 C) 98.1 F (36.7 C)   TempSrc:  Oral Oral   Resp:  16 16   Height:  5\' 4"  (1.626 m)    Weight:  159 lb 14.4 oz (72.53 kg) 158 lb 4.6 oz (71.8 kg)   SpO2: 99% 100% 99%    Weight change:   Intake/Output Summary (Last 24 hours) at 02/03/14 1326 Last data filed at 02/03/14 0830  Gross per 24 hour  Intake      0 ml  Output    300 ml  Net   -300 ml   Gen: A&O x 4, no acute distress, well developed, well nourished, TTE imaging during exam HEENT: Atraumatic, PERRL, EOMI, sclerae anicteric, moist mucous membranes Neck: No carotid bruits or JVD Heart: Regular rate and rhythm, normal S1 S2, faint systolic murmur, no rubs or gallops Lungs: Clear to auscultation bilaterally, respirations unlabored Abd: Soft, non-tender, non-distended, + bowel sounds, no hepatosplenomegaly Ext: No edema or cyanosis, no leg swelling or tenderness  Lab Results: Basic Metabolic Panel:  Recent Labs Lab 02/02/14 1130 02/03/14 0900  NA 135 141  K 4.2 4.1  CL 98 103  CO2 31 31  GLUCOSE 187* 62*  BUN 36* 33*  CREATININE 1.58* 1.52*  CALCIUM 9.5 9.8   CBC:  Recent Labs Lab 02/02/14 1130 02/03/14 0900  WBC 6.0 7.5  HGB 11.7* 12.1  HCT 35.2* 36.5  MCV 88.9 88.8  PLT 197 217   Cardiac Enzymes:  Recent Labs Lab 02/02/14 1920 02/03/14 0110 02/03/14 0900  TROPONINI <0.03 <0.03 <0.03   D-Dimer:  Recent Labs Lab 02/02/14 1130  DDIMER 0.55*   CBG:  Recent Labs Lab 02/02/14 1206 02/02/14 1923  02/02/14 2216 02/03/14 0644 02/03/14 1051  GLUCAP 193* 116* 253* 128* 90   Urinalysis:  Recent Labs Lab 02/02/14 1250  COLORURINE YELLOW  LABSPEC 1.006  PHURINE 7.0  GLUCOSEU NEGATIVE  HGBUR NEGATIVE  BILIRUBINUR NEGATIVE  KETONESUR NEGATIVE  PROTEINUR NEGATIVE  UROBILINOGEN 0.2  NITRITE NEGATIVE  LEUKOCYTESUR TRACE*   Studies/Results: Dg Chest 2 View  02/02/2014   CLINICAL DATA:  Upper left chest pain starting last night, atrial fibrillation  EXAM: CHEST  2 VIEW  COMPARISON:  12/27/2005  FINDINGS: Cardiomediastinal silhouette is unremarkable. No acute infiltrate or pleural effusion. Degenerative changes lumbar spine. No pulmonary edema.  IMPRESSION: No active cardiopulmonary disease.   Electronically Signed   By: Lahoma Crocker M.D.   On: 02/02/2014 13:09   Nm Pulmonary Perf And Vent  02/02/2014   CLINICAL DATA:  Elevated D-dimer.  Chest pain and palpitations.  EXAM: NUCLEAR MEDICINE VENTILATION - PERFUSION LUNG SCAN  TECHNIQUE: Ventilation images were obtained in multiple projections using inhaled aerosol technetium 99 M DTPA. Perfusion images were obtained in multiple projections after intravenous injection of Tc-53m MAA.  RADIOPHARMACEUTICALS:  40 mCi Tc-16m DTPA aerosol and 6 mCi Tc-60m MAA  COMPARISON:  Chest radiograph 02/02/2014  FINDINGS: Ventilation: Patchy uptake in the lungs without gross abnormality.  Perfusion:  Moderate sized periphery defect in the posterior left upper lung appears to represent a matched defect. Similarly, there are areas of decreased uptake along the anterior right lung which represent matched defects. No significant ventilation-perfusion mismatch.  IMPRESSION: Low probability for pulmonary embolism.   Electronically Signed   By: Markus Daft M.D.   On: 02/02/2014 19:23   Medications: I have reviewed the patient's current medications. Scheduled Meds: . aspirin EC  81 mg Oral Daily  . enalapril  10 mg Oral Daily  . heparin  5,000 Units Subcutaneous 3  times per day  . insulin aspart  0-9 Units Subcutaneous TID WC  . insulin detemir  10 Units Subcutaneous BID  . simvastatin  40 mg Oral q1800  . sodium chloride  3 mL Intravenous Q12H   Continuous Infusions:  PRN Meds:.acetaminophen Assessment/Plan: Principal Problem:   Chest pain Active Problems:   Diabetes mellitus with renal complications   Anxiety state   Essential hypertension   CKD (chronic kidney disease) stage 3, GFR 30-59 ml/min   Dyspnea  #Palpitations: [name] has not had recurrence of her palpitations and denies any chest pain, SOB, nausea, diaphoresis. She was seen by cardiology who agreed given [name]'s history of infant cardiac surgery for structural abnormalities an echo is reasonable. Otherwise, they feel ACS is unlikely given negative troponins and unchanged EKG and clear her for discharge if she can ambulate. PE seems unlikely given Wells score 0 and Geneva 3 (no tachycardia, chest pain, leg swelling, travel, hormone therapy, recent surgery). D-dimer was elevate and VQ scan showed low probability. LE Doppler can be considered but PE is unlikely given evidence described above.  -appreciate cards -echo -consider LE Doppler -cards f/u -tele -continue home ASA 81mg  daily  #DM2: Last hgb A1c 9.1 on 01/06/2014. On levemir 16 units QAM and 14 units QPM, actos 15mg  daily at home -sensitive SSI -Levemir 10 units BID -CBG QAC/HS  #HTN: Continues to be normotensive -Continue home enalapril 10mg  daily  #HLD -Continue home Zocor 40mg  QHS  #Chronic anemia: Hgb 11.7 on admission up to 12.1 this morning. Baseline 12. -Continue to monitor  #CKD stage 3: Creatinine 1.58 on admission now 1.52. Baseline around 1.5-2 -Continue to monitor  #FEN:  -Heart healthy/carb modified  #VTE ppx: subq heparin 5000u TID  Dispo: Disposition is deferred at this time, awaiting improvement of current medical problems.  Anticipated discharge in approximately 0-1 day(s).   The  patient does have a current PCP (Ejiroghene Arlyce Dice, MD) and does need an [facility] hospital follow-up appointment after discharge.  The patient does not know have transportation limitations that hinder transportation to clinic appointments.  .Services Needed at time of discharge: Y = Yes, Blank = No PT:   OT:   RN:   Equipment:   Other:     LOS: 1 day   Kelby Aline, MD 02/03/2014, 1:26 PM

## 2014-02-03 NOTE — Progress Notes (Addendum)
MD paged per pt request, pt would like to know when she will be discharged. Sherrie Mustache 3:52 PM   MD working on discharge. 4:19 PM

## 2014-02-03 NOTE — Progress Notes (Signed)
  Echocardiogram 2D Echocardiogram has been performed.  Diamond Nickel 02/03/2014, 11:37 AM

## 2014-02-04 LAB — GLUCOSE, CAPILLARY: GLUCOSE-CAPILLARY: 301 mg/dL — AB (ref 70–99)

## 2014-02-11 ENCOUNTER — Ambulatory Visit: Payer: Self-pay | Admitting: Internal Medicine

## 2014-02-13 ENCOUNTER — Encounter: Payer: Self-pay | Admitting: Cardiovascular Disease

## 2014-02-13 ENCOUNTER — Telehealth: Payer: Self-pay | Admitting: Dietician

## 2014-02-13 NOTE — Telephone Encounter (Signed)
triage nurse asked CDE for assistance with patient complaint that [facility].   Spoke with [facility] pharmacist who has documentation on 01/23/14 that patient was informed that Levemir pens are no longer available through patient assistance but that lantus pens are.  Humulin N kwikpens are also available. Patient received 3 sample levemir pens on 12/23/13 and 2 pens on 01-23-14 to tide her over until she and her physician made a decision about what insulin she was going to use since she will not use the Levemir vials currently being sent to her.  they can no longer give her any pens and will need a new prescription if patient changed to  lantus pens or Humulin N [place] the above information to patient. She said she still has a little left and is trying to stay on top of things. She hesitantly agreed to switch to lantus solostar insulin pens if needed.

## 2014-02-14 ENCOUNTER — Ambulatory Visit: Payer: Self-pay | Admitting: Internal Medicine

## 2014-02-14 ENCOUNTER — Other Ambulatory Visit: Payer: Self-pay | Admitting: Internal Medicine

## 2014-02-14 MED ORDER — INSULIN GLARGINE 100 UNIT/ML SOLOSTAR PEN: 30.0000 [IU] | PEN_INJECTOR | Freq: Every day | SUBCUTANEOUS | Status: DC

## 2014-02-18 NOTE — Progress Notes (Signed)
Pt called can not afford Lantus solarstar through [employer]. Rx was called in to [facility] and pt was instructed to call [facility] after lunch about getting insulin since she is just about out Levemir pens. Hilda Blades Shonta Bourque RN 02/18/14 11:30AM

## 2014-02-20 ENCOUNTER — Encounter: Payer: Self-pay | Admitting: Internal Medicine

## 2014-02-20 ENCOUNTER — Ambulatory Visit (INDEPENDENT_AMBULATORY_CARE_PROVIDER_SITE_OTHER): Payer: Self-pay | Admitting: Internal Medicine

## 2014-02-20 VITALS — BP 120/65 | HR 82 | Temp 97.9°F | Ht 64.0 in | Wt 165.0 lb

## 2014-02-20 DIAGNOSIS — Z0000 Encounter for general adult medical examination without abnormal findings: Secondary | ICD-10-CM

## 2014-02-20 DIAGNOSIS — I10 Essential (primary) hypertension: Secondary | ICD-10-CM

## 2014-02-20 DIAGNOSIS — N183 Chronic kidney disease, stage 3 unspecified: Secondary | ICD-10-CM

## 2014-02-20 DIAGNOSIS — E1122 Type 2 diabetes mellitus with diabetic chronic kidney disease: Secondary | ICD-10-CM

## 2014-02-20 DIAGNOSIS — Z87898 Personal history of other specified conditions: Secondary | ICD-10-CM

## 2014-02-20 NOTE — Assessment & Plan Note (Signed)
SCr stable. -continue to monitor and control HTN and DM

## 2014-02-20 NOTE — Progress Notes (Signed)
Patient ID: [name], female   DOB: [birth year], 59 y.o.   MRN: [id number]    Subjective:   Patient ID: [name] female    DOB: 1955/12/11 59 y.o.    MRN: [id number] Health Maintenance Due: Health Maintenance Due  Topic Date Due  . COLONOSCOPY  04/22/2007    _________________________________________________  HPI: [name] is a 59 y.o. female here for a hospital f/u visit.  Pt has a PMH outlined below.  Please see problem-based charting assessment and plan note for further details of medical issues addressed at today's visit.  PMH: Past Medical History  Diagnosis Date  . Diabetes mellitus type II, uncontrolled   . Hypertension   . Hyperlipidemia   . Ovarian cyst, left   . Anemia     due to menorrhagia, BL 8-10  . Vaginal cyst     nabothian and bartholin  . CKD (chronic kidney disease) stage 3, GFR 30-59 ml/min     baseline creatinine 1.4-1.7  . Anxiety   . Depression   . Postmenopausal bleeding 06/12/2008  . 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 UTI (lower urinary tract infection)   . IBS (irritable bowel syndrome)   . Chronic back pain   . Chronic abdominal pain   . DDD (degenerative disc disease), lumbar   . Bilateral renal cysts 01/15/2009    Qualifier: Diagnosis of  By: Tyrell Antonio MD, [place]      Medications: Current Outpatient Prescriptions on File Prior to Visit  Medication Sig Dispense Refill  . acetaminophen (TYLENOL) 325 MG tablet Take 650 mg by mouth every 6 (six) hours as needed for moderate pain.    Marland Kitchen aspirin EC 81 MG tablet Take 81 mg by mouth daily.    . enalapril (VASOTEC) 10 MG tablet TAKE 1 TABLET (10 MG TOTAL) BY MOUTH DAILY. 30 tablet 2  . Insulin Glargine (LANTUS SOLOSTAR) 100 UNIT/ML Solostar Pen Inject 30 Units into the skin daily at 10 pm. 15 mL 11  . pioglitazone (ACTOS) 15 MG tablet Take 1 tablet (15 mg total) by mouth daily. 30 tablet 2  . simvastatin (ZOCOR) 40 MG tablet TAKE 1 TABLET (40 MG TOTAL) BY  MOUTH AT BEDTIME. 30 tablet 5  . [DISCONTINUED] pantoprazole (PROTONIX) 20 MG tablet Take 2 tablets (40 mg total) by mouth daily. 30 tablet 1  . [DISCONTINUED] sertraline (ZOLOFT) 100 MG tablet Take 1 tablet (100 mg total) by mouth daily. 30 tablet 2   No current facility-administered medications on file prior to visit.    Allergies: No Known Allergies  FH: Family History  Problem Relation Age of Onset  . Stroke Father   . Heart attack Father     Had MI in his 62s  . Stomach cancer Paternal Grandmother   . Diabetes Maternal Grandmother   . Cerebral palsy Daughter   . Anesthesia problems Neg Hx   . Hypotension Neg Hx   . Malignant hyperthermia Neg Hx   . Pseudochol deficiency Neg Hx     SH: History   Social History  . Marital Status: Divorced    Spouse Name: N/A    Number of Children: 1  . Years of Education: 12th grade   Occupational History  . 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 Smoking status: Never Smoker   . Smokeless tobacco: Former Systems developer  . Alcohol Use: No  . Drug Use: No  .  Sexual Activity: No   Other Topics Concern  . 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    Review of Systems: Constitutional: Negative for fever, chills and weight loss.  Eyes: Negative for blurred vision.  Respiratory: Negative for cough and shortness of breath.  Cardiovascular: Negative for chest pain, palpitations and leg swelling.  Gastrointestinal: Negative for nausea, vomiting, abdominal pain, diarrhea, constipation and blood in stool.  Genitourinary: Negative for dysuria, urgency and frequency.  Musculoskeletal: Negative for myalgias and back pain.  Neurological: Negative for dizziness, weakness and headaches.     Objective:   Vital Signs: Filed Vitals:   02/20/14 1530  BP: 120/65  Pulse: 82  Temp: 97.9 F (36.6 C)  TempSrc: Oral  Height: 5\' 4"  (1.626 m)  Weight: 165 lb (74.844 kg)  SpO2:  100%      BP Readings from Last 3 Encounters:  02/20/14 120/65  02/03/14 97/58  01/17/14 131/64    Physical Exam: Constitutional: Vital signs reviewed.  Patient is well-developed and well-nourished in NAD and cooperative with exam.  Head: Normocephalic and atraumatic. Eyes: PERRL, EOMI, conjunctivae nl, no scleral icterus.  Neck: Supple. Cardiovascular: RRR, no MRG. Pulmonary/Chest: normal effort, CTAB, no wheezes, rales, or rhonchi. Abdominal: Soft. NT/ND +BS. Neurological: A&O x3, cranial nerves II-XII are grossly intact, moving all extremities. Extremities: 2+DP b/l; no pitting edema. Skin: Warm, dry and intact. No rash.   Assessment & Plan:   Assessment and plan was discussed and formulated with my attending.

## 2014-02-20 NOTE — Assessment & Plan Note (Signed)
Pt had initial appt for colonoscopy, but said she preferred a different provider and would reschedule her colonoscopy with Dr. Paulita Fujita.  She also has f/u with Dr. Acie Fredrickson at the end of January.   -continue to monitor -reminded to keep appts for the above

## 2014-02-20 NOTE — Progress Notes (Signed)
Case discussed with Dr. Gill soon after the resident saw the patient.  We reviewed the resident's history and exam and pertinent patient test results.  I agree with the assessment, diagnosis, and plan of care documented in the resident's note. 

## 2014-02-20 NOTE — Addendum Note (Signed)
Addended by: Jones Bales on: 02/20/2014 05:02 PM   Modules accepted: Level of Service

## 2014-02-20 NOTE — Assessment & Plan Note (Signed)
Pt will start lantus 30 units tonight.  She is concerned that this may be too much but based on her weight and hospital requirements, I reassured her that this was the correct dosing and to always check her blood sugars at least before breakfast.  I also provided her with info on hypoglycemia.  She is exercising and trying to watch her weight.  She continues actos 15mg  daily. -advised to continue actos and lantus -reminded her to check her cbgs daily -keep appt with [facility]

## 2014-02-20 NOTE — Assessment & Plan Note (Signed)
BP at goal.  -continue enalapril 10mg 

## 2014-02-20 NOTE — Assessment & Plan Note (Signed)
Pt denies any further episodes of palpitations or chest pain.  She had workup for CP which was normal-->neg trops, EKG.  Echo showed grade 1 DD but normal LVEF.  Dr. Acie Fredrickson was to see her for outpatient f/u but she had to reschedule for later January.   -reminded her to keep f/u appt with Dr. Acie Fredrickson in January

## 2014-02-20 NOTE — Patient Instructions (Signed)
Thank you for your visit today.   Please return to the internal medicine clinic around 04/08/2014 to see Dr. Denton Brick or sooner if needed.   Your current medical regimen is effective;  continue present plan and take all medications as prescribed.    Please keep your appointment with cardiology and gastroenterology. Please check your blood sugars at least before breakfast and before dinner or at bedtime.   Please see the information below regarding low blood sugars and the symptoms.   I will set you up an appointment to see Debera Lat, our diabetes educator.   Please be sure to bring all of your medications with you to every visit; this includes herbal supplements, vitamins, eye drops, and any over-the-counter medications.   Should you have any questions regarding your medications and/or any new or worsening symptoms, please be sure to call the clinic at [phone].   If you believe that you are suffering from a life threatening condition or one that may result in the loss of limb or function, then you should call 911 or proceed to the nearest Emergency Department.   Hypoglycemia Hypoglycemia occurs when the glucose in your blood is too low. Glucose is a type of sugar that is your body's main energy source. Hormones, such as insulin and glucagon, control the level of glucose in the blood. Insulin lowers blood glucose and glucagon increases blood glucose. Having too much insulin in your blood stream, or not eating enough food containing sugar, can result in hypoglycemia. Hypoglycemia can happen to people with or without diabetes. It can develop quickly and can be a medical emergency.  CAUSES   Missing or delaying meals.  Not eating enough carbohydrates at meals.  Taking too much diabetes medicine.  Not timing your oral diabetes medicine or insulin doses with meals, snacks, and exercise.  Nausea and vomiting.  Certain medicines.  Severe illnesses, such as hepatitis, kidney disorders,  and certain eating disorders.  Increased activity or exercise without eating something extra or adjusting medicines.  Drinking too much alcohol.  A nerve disorder that affects body functions like your heart rate, blood pressure, and digestion (autonomic neuropathy).  A condition where the stomach muscles do not function properly (gastroparesis). Therefore, medicines and food may not absorb properly.  Rarely, a tumor of the pancreas can produce too much insulin. SYMPTOMS   Hunger.  Sweating (diaphoresis).  Change in body temperature.  Shakiness.  Headache.  Anxiety.  Lightheadedness.  Irritability.  Difficulty concentrating.  Dry mouth.  Tingling or numbness in the hands or feet.  Restless sleep or sleep disturbances.  Altered speech and coordination.  Change in mental status.  Seizures or prolonged convulsions.  Combativeness.  Drowsiness (lethargic).  Weakness.  Increased heart rate or palpitations.  Confusion.  Pale, gray skin color.  Blurred or double vision.  Fainting. DIAGNOSIS  A physical exam and medical history will be performed. Your caregiver may make a diagnosis based on your symptoms. Blood tests and other lab tests may be performed to confirm a diagnosis. Once the diagnosis is made, your caregiver will see if your signs and symptoms go away once your blood glucose is raised.  TREATMENT  Usually, you can easily treat your hypoglycemia when you notice symptoms.  Check your blood glucose. If it is less than 70 mg/dl, take one of the following:   3-4 glucose tablets.    cup juice.    cup regular soda.   1 cup skim milk.   -1 tube of glucose  gel.   5-6 hard candies.   Avoid high-fat drinks or food that may delay a rise in blood glucose levels.  Do not take more than the recommended amount of sugary foods, drinks, gel, or tablets. Doing so will cause your blood glucose to go too high.   Wait 10-15 minutes and recheck  your blood glucose. If it is still less than 70 mg/dl or below your target range, repeat treatment.   Eat a snack if it is more than 1 hour until your next meal.  There may be a time when your blood glucose may go so low that you are unable to treat yourself at home when you start to notice symptoms. You may need someone to help you. You may even faint or be unable to swallow. If you cannot treat yourself, someone will need to bring you to the hospital.  [place]  If you have diabetes, follow your diabetes management plan by:  Taking your medicines as directed.  Following your exercise plan.  Following your meal plan. Do not skip meals. Eat on time.  Testing your blood glucose regularly. Check your blood glucose before and after exercise. If you exercise longer or different than usual, be sure to check blood glucose more frequently.  Wearing your medical alert jewelry that says you have diabetes.  Identify the cause of your hypoglycemia. Then, develop ways to prevent the recurrence of hypoglycemia.  Do not take a hot bath or shower right after an insulin shot.  Always carry treatment with you. Glucose tablets are the easiest to carry.  If you are going to drink alcohol, drink it only with meals.  Tell friends or family members ways to keep you safe during a seizure. This may include removing hard or sharp objects from the area or turning you on your side.  Maintain a healthy weight. SEEK MEDICAL CARE IF:   You are having problems keeping your blood glucose in your target range.  You are having frequent episodes of hypoglycemia.  You feel you might be having side effects from your medicines.  You are not sure why your blood glucose is dropping so low.  You notice a change in vision or a new problem with your vision. SEEK IMMEDIATE MEDICAL CARE IF:   Confusion develops.  A change in mental status occurs.  The inability to swallow develops.  Fainting  occurs. Document Released: 01/24/2005 Document Revised: 01/29/2013 Document Reviewed: 05/23/2011 [facility] Patient Information 2015 [place], [place]. This information is not intended to replace advice given to you by your health care provider. Make sure you discuss any questions you have with your health care provider.

## 2014-02-27 ENCOUNTER — Encounter (HOSPITAL_COMMUNITY): Payer: Self-pay | Admitting: *Deleted

## 2014-02-27 ENCOUNTER — Ambulatory Visit: Payer: Self-pay | Admitting: Dietician

## 2014-03-04 NOTE — Addendum Note (Signed)
Addended by: Hulan Fray on: 03/04/2014 07:39 PM   Modules accepted: Orders

## 2014-03-06 ENCOUNTER — Encounter: Payer: Self-pay | Admitting: Cardiovascular Disease

## 2014-03-07 ENCOUNTER — Encounter (HOSPITAL_COMMUNITY): Payer: Self-pay

## 2014-03-07 ENCOUNTER — Emergency Department (INDEPENDENT_AMBULATORY_CARE_PROVIDER_SITE_OTHER)
Admission: EM | Admit: 2014-03-07 | Discharge: 2014-03-07 | Disposition: A | Discharge status: Home / Self Care | Payer: No Typology Code available for payment source | Source: Home / Self Care | Attending: Emergency Medicine | Admitting: Emergency Medicine

## 2014-03-07 DIAGNOSIS — M549 Dorsalgia, unspecified: Secondary | ICD-10-CM

## 2014-03-07 LAB — POCT URINALYSIS DIP (DEVICE)
BILIRUBIN URINE: NEGATIVE
GLUCOSE, UA: 250 mg/dL — AB
Ketones, ur: NEGATIVE mg/dL
Nitrite: NEGATIVE
PH: 5.5 (ref 5.0–8.0)
PROTEIN: NEGATIVE mg/dL
Urobilinogen, UA: 0.2 mg/dL (ref 0.0–1.0)

## 2014-03-07 MED ORDER — CIPROFLOXACIN HCL 500 MG PO TABS: 500.0000 mg | ORAL_TABLET | Freq: Two times a day (BID) | ORAL | Status: DC

## 2014-03-07 NOTE — ED Provider Notes (Signed)
Chief Complaint   Back Pain   History of Present Illness   [name] is a 59 year old female who has had a 2 to three-day history of lower back pain up and down her entire back. She denies any injury. It hurts worse to bend, to lift, twist. She also has some lower abdominal pain, and some stress incontinence. She thinks she might have urinary tract infection but denies any dysuria, frequency, and urgency. She's had no fever, chills, nausea, vomiting, or blood in the urine. She denies any radiation the pain down the legs, numbness, tingling, muscle weakness, saddle anesthesia, or loss of bowel control. She's had no fever, chills, or unintended weight loss.  Review of Systems   Other than as noted above, the patient denies any of the following symptoms: General:  No fevers or chills. GI:  No abdominal pain, back pain, nausea, or vomiting. GU:  No hematuria or incontinence. GYN:  No discharge, itching, vulvar pain or lesions, pelvic pain, or abnormal vaginal bleeding.  [place]   Past medical history, family history, social history, meds, and allergies were reviewed.  She has type 2 diabetes and heart problems. Current meds include aspirin, Vasotec, Lantus, Actos, and Zocor.  Physical Examination     Vital signs:  BP 148/88 mmHg  Pulse 89  Temp(Src) 98.3 F (36.8 C) (Oral)  Resp 18  SpO2 99% Gen:  Alert, oriented, in no distress. Lungs:  Clear to auscultation, no wheezes, rales or rhonchi. Heart:  Regular rhythm, no gallop or murmer. Abdomen:  Flat and soft. There was slight suprapubic pain to palpation.  No guarding, or rebound.  No hepato-splenomegaly or mass.  Bowel sounds were normally active.  No hernia. Back:  No CVA tenderness. Her back has a full range of motion with minimal pain. Straight leg raising is negative. Extremities: No edema, pedal pulses not felt. Neurologic exam: Normal DTRs, muscle strength, and sensation in lower extremities. Skin:  Clear, warm and  dry.  Labs   Results for orders placed or performed during the hospital encounter of 03/07/14  POCT urinalysis dip (device)  Result Value Ref Range   Glucose, UA 250 (A) NEGATIVE mg/dL   Bilirubin Urine NEGATIVE NEGATIVE   Ketones, ur NEGATIVE NEGATIVE mg/dL   Specific Gravity, Urine <=1.005 1.005 - 1.030   Hgb urine dipstick TRACE (A) NEGATIVE   pH 5.5 5.0 - 8.0   Protein, ur NEGATIVE NEGATIVE mg/dL   Urobilinogen, UA 0.2 0.0 - 1.0 mg/dL   Nitrite NEGATIVE NEGATIVE   Leukocytes, UA LARGE (A) NEGATIVE     A urine culture was obtained.  Results are pending at this time and we will call about any positive results.  Assessment   The encounter diagnosis was Bilateral back pain, unspecified location.   No evidence of pyelonephritis, since she does not have any fever, is not septic, and has no CVA tenderness.  Back pain may be musculoskeletal, possibly due to muscle strain, arthritis, or degenerative disc disease.  Plan   1.  Meds:  The following meds were prescribed:   Discharge Medication List as of 03/07/2014  7:28 PM      She was given a prescription for Cipro 500 mg #20, 1 twice a day for 10 days.  2.  Patient Education/Counseling:  The patient was given appropriate handouts, self care instructions, and instructed in symptomatic relief. The patient was told to avoid intercourse for 10 days, get extra fluids, and return for a follow up with her primary  care doctor at the completion of treatment for a repeat UA and culture.    3.  Follow up:  The patient was told to follow up here if no better in 3 to 4 days, or sooner if becoming worse in any way, and given some red flag symptoms such as fever, persistent vomiting, or severe flank or abdominal pain which would prompt immediate return.     Harden Mo, MD 03/07/14 [phone]

## 2014-03-07 NOTE — Discharge Instructions (Signed)

## 2014-03-07 NOTE — ED Notes (Signed)
Multiple issues, but mainly back and knee pain today

## 2014-03-09 LAB — URINE CULTURE
Colony Count: >=100000
Special Requests: NORMAL

## 2014-03-26 ENCOUNTER — Ambulatory Visit (HOSPITAL_COMMUNITY): Admission: RE | Admit: 2014-03-26 | Payer: MEDICAID | Source: Ambulatory Visit | Admitting: Gastroenterology

## 2014-03-26 HISTORY — DX: Personal history of other specified conditions: Z87.898

## 2014-03-26 HISTORY — DX: Type 2 diabetes mellitus without complications: E11.9

## 2014-03-26 SURGERY — COLONOSCOPY WITH PROPOFOL
Anesthesia: Monitor Anesthesia Care

## 2014-04-01 ENCOUNTER — Ambulatory Visit: Payer: No Typology Code available for payment source

## 2014-04-02 ENCOUNTER — Ambulatory Visit: Payer: No Typology Code available for payment source

## 2014-04-06 ENCOUNTER — Emergency Department (HOSPITAL_COMMUNITY)
Admission: EM | Admit: 2014-04-06 | Discharge: 2014-04-07 | Disposition: A | Discharge status: Home / Self Care | Payer: Self-pay | Attending: Emergency Medicine | Admitting: Emergency Medicine

## 2014-04-06 ENCOUNTER — Encounter (HOSPITAL_COMMUNITY): Payer: Self-pay | Admitting: Emergency Medicine

## 2014-04-06 DIAGNOSIS — Z794 Long term (current) use of insulin: Secondary | ICD-10-CM | POA: Insufficient documentation

## 2014-04-06 DIAGNOSIS — G8929 Other chronic pain: Secondary | ICD-10-CM | POA: Insufficient documentation

## 2014-04-06 DIAGNOSIS — Z3202 Encounter for pregnancy test, result negative: Secondary | ICD-10-CM | POA: Insufficient documentation

## 2014-04-06 DIAGNOSIS — I129 Hypertensive chronic kidney disease with stage 1 through stage 4 chronic kidney disease, or unspecified chronic kidney disease: Secondary | ICD-10-CM | POA: Insufficient documentation

## 2014-04-06 DIAGNOSIS — Z8774 Personal history of (corrected) congenital malformations of heart and circulatory system: Secondary | ICD-10-CM | POA: Insufficient documentation

## 2014-04-06 DIAGNOSIS — N183 Chronic kidney disease, stage 3 (moderate): Secondary | ICD-10-CM | POA: Insufficient documentation

## 2014-04-06 DIAGNOSIS — Z7982 Long term (current) use of aspirin: Secondary | ICD-10-CM | POA: Insufficient documentation

## 2014-04-06 DIAGNOSIS — Z8659 Personal history of other mental and behavioral disorders: Secondary | ICD-10-CM | POA: Insufficient documentation

## 2014-04-06 DIAGNOSIS — Z8742 Personal history of other diseases of the female genital tract: Secondary | ICD-10-CM | POA: Insufficient documentation

## 2014-04-06 DIAGNOSIS — Z862 Personal history of diseases of the blood and blood-forming organs and certain disorders involving the immune mechanism: Secondary | ICD-10-CM | POA: Insufficient documentation

## 2014-04-06 DIAGNOSIS — Z8719 Personal history of other diseases of the digestive system: Secondary | ICD-10-CM | POA: Insufficient documentation

## 2014-04-06 DIAGNOSIS — E785 Hyperlipidemia, unspecified: Secondary | ICD-10-CM | POA: Insufficient documentation

## 2014-04-06 DIAGNOSIS — Z79899 Other long term (current) drug therapy: Secondary | ICD-10-CM | POA: Insufficient documentation

## 2014-04-06 DIAGNOSIS — M1990 Unspecified osteoarthritis, unspecified site: Secondary | ICD-10-CM | POA: Insufficient documentation

## 2014-04-06 DIAGNOSIS — E119 Type 2 diabetes mellitus without complications: Secondary | ICD-10-CM | POA: Insufficient documentation

## 2014-04-06 DIAGNOSIS — N390 Urinary tract infection, site not specified: Secondary | ICD-10-CM | POA: Insufficient documentation

## 2014-04-06 NOTE — ED Notes (Signed)
Left abdominal "catchy feeling" that is intermittent for >3weeks. Pain has not increased by her daughter insisted she come in.

## 2014-04-06 NOTE — ED Provider Notes (Signed)
CSN: [id number]     Arrival date & time 04/06/14  2142 History  This chart was scribed for Everlene Balls, MD by [name], ED Scribe. This patient was seen in room D36C/D36C and the patient's care was started at 11:32 PM.     Chief Complaint  Patient presents with  . Abdominal Pain   The history is provided by the patient. No language interpreter was used.     HPI Comments: [name] is a 59 y.o. female with past medical history of uncontrolled DM II, HTN, HLD, left ovarian cyst, CKD stage 3, bilateral renal cysts, UTI, IBS, chronic abdominal pain who presents to the Emergency Department complaining of two weeks of intermittent LLQ abdominal pain. She describes this feeling as a "weird, catchy" feeling that comes on randomly; she cannot specify any circumstances that bring on her pain. The pain lasts a short time before fading; it has resolved at present. She denies any blood in her stool, any urinary symptoms, vomiting.   Patient explains that she is headed to her PCP in two days and will discuss this issue at that time.   Past Medical History  Diagnosis Date  . Diabetes mellitus type II, uncontrolled   . Hypertension   . Hyperlipidemia   . Ovarian cyst, left   . Anemia     due to menorrhagia, BL 8-10  . Vaginal cyst     nabothian and bartholin  . CKD (chronic kidney disease) stage 3, GFR 30-59 ml/min     baseline creatinine 1.4-1.7  . Anxiety   . Depression   . Postmenopausal bleeding 06/12/2008  . 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 UTI (lower urinary tract infection)   . IBS (irritable bowel syndrome)   . Chronic back pain   . Chronic abdominal pain   . DDD (degenerative disc disease), lumbar   . Bilateral renal cysts 01/15/2009    Qualifier: Diagnosis of  By: Tyrell Antonio MD, Belkys    . Diabetes mellitus without complication   . History of palpitations     evaluated recently 12'15   Past Surgical History  Procedure Laterality Date  . Cardiac  surgery      to repair congenital defect as a child- 45months old   Family History  Problem Relation Age of Onset  . Stroke Father   . Heart attack Father     Had MI in his 37s  . Stomach cancer Paternal Grandmother   . Diabetes Maternal Grandmother   . Cerebral palsy Daughter   . Anesthesia problems Neg Hx   . Hypotension Neg Hx   . Malignant hyperthermia Neg Hx   . Pseudochol deficiency Neg Hx    History  Substance Use Topics  . Smoking status: Never Smoker   . Smokeless tobacco: Never Used  . Alcohol Use: No   OB History    Gravida Para Term Preterm AB TAB SAB Ectopic Multiple Living   3 1 1  2  2   1      Review of Systems  A complete 10 system review of systems was obtained and all systems are negative except as noted in the HPI and PMH.    Allergies  Review of patient's allergies indicates no known allergies.  Home Medications   Prior to Admission medications   Medication Sig Start Date End Date Taking? Authorizing Provider  acetaminophen (TYLENOL) 325 MG tablet Take 650 mg by mouth every 6 (six) hours as  needed for moderate pain.    Historical Provider, MD  aspirin EC 81 MG tablet Take 81 mg by mouth every morning.     Historical Provider, MD  ciprofloxacin (CIPRO) 500 MG tablet Take 1 tablet (500 mg total) by mouth every 12 (twelve) hours. 03/07/14   Harden Mo, MD  enalapril (VASOTEC) 10 MG tablet TAKE 1 TABLET (10 MG TOTAL) BY MOUTH DAILY. 12/16/13   Ejiroghene Arlyce Dice, MD  Insulin Glargine (LANTUS SOLOSTAR) 100 UNIT/ML Solostar Pen Inject 30 Units into the skin daily at 10 pm. 02/14/14   Ejiroghene E Denton Brick, MD  pioglitazone (ACTOS) 15 MG tablet Take 1 tablet (15 mg total) by mouth daily. Patient taking differently: Take 15 mg by mouth every morning.  01/02/14   Ejiroghene Arlyce Dice, MD  simvastatin (ZOCOR) 40 MG tablet TAKE 1 TABLET (40 MG TOTAL) BY MOUTH AT BEDTIME. 11/26/13   [name], MD   Pulse 78  Temp(Src) 98.2 F (36.8 C) (Oral)  Ht 5'  4" (1.626 m)  Wt 162 lb (73.483 kg)  BMI 27.79 kg/m2  SpO2 97% Physical Exam  Constitutional: She is oriented to person, place, and time. She appears well-developed and well-nourished. No distress.  HENT:  Head: Normocephalic and atraumatic.  Nose: Nose normal.  Mouth/Throat: Oropharynx is clear and moist. No oropharyngeal exudate.  Eyes: Conjunctivae and EOM are normal. Pupils are equal, round, and reactive to light. No scleral icterus.  Neck: Normal range of motion. Neck supple. No JVD present. No tracheal deviation present. No thyromegaly present.  Cardiovascular: Normal rate, regular rhythm and normal heart sounds.  Exam reveals no gallop and no friction rub.   No murmur heard. Pulmonary/Chest: Effort normal and breath sounds normal. No respiratory distress. She has no wheezes. She exhibits no tenderness.  Abdominal: Soft. Bowel sounds are normal. She exhibits no distension and no mass. There is no tenderness. There is no rebound and no guarding.  Musculoskeletal: Normal range of motion. She exhibits no edema or tenderness.  Lymphadenopathy:    She has no cervical adenopathy.  Neurological: She is alert and oriented to person, place, and time. No cranial nerve deficit. She exhibits normal muscle tone.  Skin: Skin is warm and dry. No rash noted. No erythema. No pallor.  Nursing note and vitals reviewed.   ED Course  Procedures   DIAGNOSTIC STUDIES: Oxygen Saturation is 97% on RA, adequate by my interpretation.    COORDINATION OF CARE: 11:37 PM Discussed treatment plan with pt at bedside and pt agreed to plan.   Labs Review Labs Reviewed  COMPREHENSIVE METABOLIC PANEL - Abnormal; Notable for the following:    Glucose, Bld 247 (*)    BUN 42 (*)    Creatinine, Ser 1.53 (*)    GFR calc non Af Amer 37 (*)    GFR calc Af Amer 43 (*)    All other components within normal limits  URINALYSIS, ROUTINE W REFLEX MICROSCOPIC - Abnormal; Notable for the following:    APPearance  CLOUDY (*)    Leukocytes, UA LARGE (*)    All other components within normal limits  URINE MICROSCOPIC-ADD ON - Abnormal; Notable for the following:    Squamous Epithelial / LPF FEW (*)    Bacteria, UA MANY (*)    All other components within normal limits  LIPASE, BLOOD  POC URINE PREG, ED    Imaging Review No results found.   EKG Interpretation None      MDM   Final  diagnoses:  None   patient since emergency department for left lower and left upper quadrant abdominal pain. She is unable to characterize this pain. She does have a history of diverticulitis and IBS. Patient has follow-up appointment with her primary care physician in 2 days, she is advised to attend this appointment for continued evaluation. Will obtain abdominal labs and urinalysis in the emergency department. Patient is currently nontender on exam, her vital signs remain within her normal limits, as laboratory studies unremarkable patient will be safe for discharge.  Urinalysis reveals an infection, patient will be given Macrobid in emergency department and discharged with a prescription. She has primary care follow-up in 2 days. Her vital signs remain within her normal limits and she is safe for discharge.   I personally performed the services described in this documentation, which was scribed in my presence. The recorded information has been reviewed and is accurate.      Everlene Balls, MD 04/07/14 [phone]

## 2014-04-07 ENCOUNTER — Telehealth: Payer: Self-pay | Admitting: Internal Medicine

## 2014-04-07 LAB — URINE MICROSCOPIC-ADD ON

## 2014-04-07 LAB — URINALYSIS, ROUTINE W REFLEX MICROSCOPIC
BILIRUBIN URINE: NEGATIVE
Glucose, UA: NEGATIVE mg/dL
Hgb urine dipstick: NEGATIVE
KETONES UR: NEGATIVE mg/dL
Nitrite: NEGATIVE
Protein, ur: NEGATIVE mg/dL
Specific Gravity, Urine: 1.014 (ref 1.005–1.030)
UROBILINOGEN UA: 0.2 mg/dL (ref 0.0–1.0)
pH: 6 (ref 5.0–8.0)

## 2014-04-07 LAB — COMPREHENSIVE METABOLIC PANEL
ALK PHOS: 90 U/L (ref 39–117)
ALT: 19 U/L (ref 0–35)
ANION GAP: 6 (ref 5–15)
AST: 24 U/L (ref 0–37)
Albumin: 3.8 g/dL (ref 3.5–5.2)
BUN: 42 mg/dL — AB (ref 6–23)
CO2: 31 mmol/L (ref 19–32)
CREATININE: 1.53 mg/dL — AB (ref 0.50–1.10)
Calcium: 9 mg/dL (ref 8.4–10.5)
Chloride: 99 mmol/L (ref 96–112)
GFR, EST AFRICAN AMERICAN: 43 mL/min — AB (ref >90)
GFR, EST NON AFRICAN AMERICAN: 37 mL/min — AB (ref >90)
GLUCOSE: 247 mg/dL — AB (ref 70–99)
Potassium: 3.9 mmol/L (ref 3.5–5.1)
Sodium: 136 mmol/L (ref 135–145)
Total Bilirubin: 0.3 mg/dL (ref 0.3–1.2)
Total Protein: 6.3 g/dL (ref 6.0–8.3)

## 2014-04-07 LAB — LIPASE, BLOOD: Lipase: 23 U/L (ref 11–59)

## 2014-04-07 LAB — POC URINE PREG, ED: Preg Test, Ur: NEGATIVE

## 2014-04-07 MED ORDER — NITROFURANTOIN MONOHYD MACRO 100 MG PO CAPS: 100.0000 mg | ORAL_CAPSULE | Freq: Two times a day (BID) | ORAL | Status: DC

## 2014-04-07 MED ORDER — CEPHALEXIN 500 MG PO CAPS: 500.0000 mg | ORAL_CAPSULE | Freq: Two times a day (BID) | ORAL | Status: DC

## 2014-04-07 MED ORDER — NITROFURANTOIN MONOHYD MACRO 100 MG PO CAPS
100.0000 mg | ORAL_CAPSULE | Freq: Once | ORAL | Status: AC
  Administered 2014-04-07: 100 mg via ORAL
  Filled 2014-04-07: qty 1

## 2014-04-07 NOTE — Discharge Instructions (Signed)
Urinary Tract Infection Ms. Talkington, Your urine shows an infection, take antibiotics as prescribed and follow up with your regular physician on Tuesday.  If symptoms worsen, come back to the ED immediately.  Thank you. A urinary tract infection (UTI) can occur any place along the urinary tract. The tract includes the kidneys, ureters, bladder, and urethra. A type of germ called bacteria often causes a UTI. UTIs are often helped with antibiotic medicine.  HOME CARE   If given, take antibiotics as told by your doctor. Finish them even if you start to feel better.  Drink enough fluids to keep your pee (urine) clear or pale yellow.  Avoid tea, drinks with caffeine, and bubbly (carbonated) drinks.  Pee often. Avoid holding your pee in for a long time.  Pee before and after having sex (intercourse).  Wipe from front to back after you poop (bowel movement) if you are a woman. Use each tissue only once. GET HELP RIGHT AWAY IF:   You have back pain.  You have lower belly (abdominal) pain.  You have chills.  You feel sick to your stomach (nauseous).  You throw up (vomit).  Your burning or discomfort with peeing does not go away.  You have a fever.  Your symptoms are not better in 3 days. MAKE SURE YOU:   Understand these instructions.  Will watch your condition.  Will get help right away if you are not doing well or get worse. Document Released: 07/13/2007 Document Revised: 10/19/2011 Document Reviewed: 08/25/2011 [facility] Patient Information 2015 [place], [place]. This information is not intended to replace advice given to you by your health care provider. Make sure you discuss any questions you have with your health care provider.

## 2014-04-07 NOTE — Telephone Encounter (Signed)
Call to patient to confirm appointment for 04/08/14 at 3:15 lmtcb

## 2014-04-07 NOTE — Telephone Encounter (Signed)
   Reason for call:   I received a call from [name] at 5:50  PM indicating that she was seen for a UTI last night in the ED but could not afford the macrobid she was perscribed.  She reports she needs a free medication from [employer].   Pertinent Data:   U/A suggestive of UTI, UCx polymicrobial.  Patient with dysuria.  Treated with Cipro in Jan 2016.  Has appointment at [facility] tomorrow.   Assessment / Plan / Recommendations:   Order Keflex 500mg  BID x5 days.  Reviewed instructions with patient.  She will come in for appointment tomorrow.  As always, pt is advised that if symptoms worsen or new symptoms arise, they should go to an urgent care facility or to to ER for further evaluation.   Maria Groves, DO   04/07/2014, 5:50 PM

## 2014-04-08 ENCOUNTER — Encounter: Payer: Self-pay | Admitting: Internal Medicine

## 2014-04-22 ENCOUNTER — Emergency Department (INDEPENDENT_AMBULATORY_CARE_PROVIDER_SITE_OTHER)
Admission: EM | Admit: 2014-04-22 | Discharge: 2014-04-22 | Disposition: A | Discharge status: Home / Self Care | Payer: Self-pay | Source: Home / Self Care | Attending: Emergency Medicine | Admitting: Emergency Medicine

## 2014-04-22 ENCOUNTER — Encounter (HOSPITAL_COMMUNITY): Payer: Self-pay | Admitting: *Deleted

## 2014-04-22 DIAGNOSIS — M5136 Other intervertebral disc degeneration, lumbar region: Secondary | ICD-10-CM

## 2014-04-22 LAB — POCT URINALYSIS DIP (DEVICE)
Bilirubin Urine: NEGATIVE
Glucose, UA: NEGATIVE mg/dL
HGB URINE DIPSTICK: NEGATIVE
Ketones, ur: NEGATIVE mg/dL
NITRITE: NEGATIVE
Protein, ur: NEGATIVE mg/dL
Specific Gravity, Urine: 1.01 (ref 1.005–1.030)
Urobilinogen, UA: 0.2 mg/dL (ref 0.0–1.0)
pH: 5 (ref 5.0–8.0)

## 2014-04-22 NOTE — ED Provider Notes (Signed)
CSN: [id number]     Arrival date & time 04/22/14  1527 History   First MD Initiated Contact with Patient 04/22/14 1708     Chief Complaint  Patient presents with  . Back Pain   (Consider location/radiation/quality/duration/timing/severity/associated sxs/prior Treatment) HPI  She is a 59 year old woman here for evaluation of low back pain. It is on the left side. It will occasionally radiate into her buttock. It has been present on and off for about one month. It is temporarily relieved by Tylenol. She states she had an x-ray several years ago and there's something wrong with her back.  No radicular pain. No numbness, tingling, weakness in her lower extremities. No injury or trauma. She reports some urinary frequency, but denies dysuria. No fevers or chills.  Past Medical History  Diagnosis Date  . Diabetes mellitus type II, uncontrolled   . Hypertension   . Hyperlipidemia   . Ovarian cyst, left   . Anemia     due to menorrhagia, BL 8-10  . Vaginal cyst     nabothian and bartholin  . CKD (chronic kidney disease) stage 3, GFR 30-59 ml/min     baseline creatinine 1.4-1.7  . Anxiety   . Depression   . Postmenopausal bleeding 06/12/2008  . 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 UTI (lower urinary tract infection)   . IBS (irritable bowel syndrome)   . Chronic back pain   . Chronic abdominal pain   . DDD (degenerative disc disease), lumbar   . Bilateral renal cysts 01/15/2009    Qualifier: Diagnosis of  By: Tyrell Antonio MD, Belkys    . Diabetes mellitus without complication   . History of palpitations     evaluated recently 12'15   Past Surgical History  Procedure Laterality Date  . Cardiac surgery      to repair congenital defect as a child- 13months old   Family History  Problem Relation Age of Onset  . Stroke Father   . Heart attack Father     Had MI in his 65s  . Stomach cancer Paternal Grandmother   . Diabetes Maternal Grandmother   . Cerebral palsy  Daughter   . Anesthesia problems Neg Hx   . Hypotension Neg Hx   . Malignant hyperthermia Neg Hx   . Pseudochol deficiency Neg Hx    History  Substance Use Topics  . Smoking status: Never Smoker   . Smokeless tobacco: Never Used  . Alcohol Use: No   OB History    Gravida Para Term Preterm AB TAB SAB Ectopic Multiple Living   3 1 1  2  2   1      Review of Systems  Constitutional: Positive for fatigue.  Genitourinary: Positive for frequency. Negative for dysuria.  Musculoskeletal: Positive for back pain.  Neurological: Negative for weakness and numbness.    Allergies  Review of patient's allergies indicates no known allergies.  Home Medications   Prior to Admission medications   Medication Sig Start Date End Date Taking? Authorizing Provider  acetaminophen (TYLENOL) 325 MG tablet Take 650 mg by mouth every 6 (six) hours as needed for moderate pain.   Yes Historical Provider, MD  aspirin EC 81 MG tablet Take 81 mg by mouth every morning.    Yes Historical Provider, MD  enalapril (VASOTEC) 10 MG tablet TAKE 1 TABLET (10 MG TOTAL) BY MOUTH DAILY. 12/16/13  Yes [name], MD  Insulin Glargine (LANTUS SOLOSTAR) 100 UNIT/ML  Solostar Pen Inject 30 Units into the skin daily at 10 pm. 02/14/14  Yes [name], MD  pioglitazone (ACTOS) 15 MG tablet Take 1 tablet (15 mg total) by mouth daily. Patient taking differently: Take 15 mg by mouth every morning.  01/02/14  Yes [name], MD  simvastatin (ZOCOR) 40 MG tablet TAKE 1 TABLET (40 MG TOTAL) BY MOUTH AT BEDTIME. 11/26/13  Yes [name], MD  cephALEXin (KEFLEX) 500 MG capsule Take 1 capsule (500 mg total) by mouth 2 (two) times daily. 04/07/14   Lucious Groves, DO   BP 128/84 mmHg  Pulse 81  Temp(Src) 98.5 F (36.9 C) (Oral)  Resp 16  SpO2 99% Physical Exam  Constitutional: She is oriented to person, place, and time. She appears well-developed and well-nourished. No distress.  Cardiovascular:  Normal rate.   Pulmonary/Chest: Effort normal.  Musculoskeletal:  Back: No vertebral tenderness or step-offs. Mild tenderness over left SI joint.  Neurological: She is alert and oriented to person, place, and time.    ED Course  Procedures (including critical care time) Labs Review Labs Reviewed  POCT URINALYSIS DIP (DEVICE) - Abnormal; Notable for the following:    Leukocytes, UA LARGE (*)    All other components within normal limits  URINE CULTURE    Imaging Review No results found.   MDM   1. DDD (degenerative disc disease), lumbar    Discussed safe levels of Tylenol. She is unable to take NSAIDs due to kidney disease. We discussed the possibility of prednisone, but decided not to do this due to uncontrolled diabetes. She will follow-up with her PCP to discuss her blood sugars as well as her fatigue.    Melony Overly, MD 04/22/14 1758

## 2014-04-22 NOTE — ED Notes (Signed)
C/o low back pain that comes and goes for 1 month.  No burning but has some frequency in small amounts.  Took antibiotic 3 weeks ago for a UTI.  C/o feeling tired.

## 2014-04-22 NOTE — Discharge Instructions (Signed)
You have arthritis in your back. You can take up to 3 g of Tylenol a day.  This would be 4 Tylenol arthritis tablets spread out over the day or 6 extra strength tablets spread out over the day.  Please follow-up with your primary care physician to discuss your sugars as this may be contributing to your fatigue.

## 2014-04-23 ENCOUNTER — Encounter: Payer: Self-pay | Admitting: Internal Medicine

## 2014-04-24 LAB — URINE CULTURE
Colony Count: NO GROWTH
Culture: NO GROWTH

## 2014-04-29 ENCOUNTER — Encounter: Payer: Self-pay | Admitting: Internal Medicine

## 2014-04-29 ENCOUNTER — Ambulatory Visit (INDEPENDENT_AMBULATORY_CARE_PROVIDER_SITE_OTHER): Payer: Self-pay | Admitting: Internal Medicine

## 2014-04-29 DIAGNOSIS — M545 Low back pain, unspecified: Secondary | ICD-10-CM

## 2014-04-29 DIAGNOSIS — Z0000 Encounter for general adult medical examination without abnormal findings: Secondary | ICD-10-CM

## 2014-04-29 DIAGNOSIS — I10 Essential (primary) hypertension: Secondary | ICD-10-CM

## 2014-04-29 DIAGNOSIS — N183 Chronic kidney disease, stage 3 unspecified: Secondary | ICD-10-CM

## 2014-04-29 DIAGNOSIS — E1122 Type 2 diabetes mellitus with diabetic chronic kidney disease: Secondary | ICD-10-CM

## 2014-04-29 LAB — POCT GLYCOSYLATED HEMOGLOBIN (HGB A1C): Hemoglobin A1C: 8.7

## 2014-04-29 LAB — GLUCOSE, CAPILLARY: GLUCOSE-CAPILLARY: 231 mg/dL — AB (ref 70–99)

## 2014-04-29 MED ORDER — INSULIN GLARGINE 100 UNIT/ML SOLOSTAR PEN: 25.0000 [IU] | PEN_INJECTOR | Freq: Every day | SUBCUTANEOUS | Status: DC

## 2014-04-29 MED ORDER — PIOGLITAZONE HCL 30 MG PO TABS: 30.0000 mg | ORAL_TABLET | Freq: Every day | ORAL | Status: DC

## 2014-04-29 MED ORDER — ACETAMINOPHEN 325 MG PO TABS: 650.0000 mg | ORAL_TABLET | Freq: Four times a day (QID) | ORAL | Status: DC | PRN

## 2014-04-29 NOTE — Patient Instructions (Signed)
-  Your A1c has improved to 8.7 from 9.1 start taking actos 30 mg daily instead of 15 mg daily and decrease your Lantus from 30 U daily to 25 U daily due to low blood sugars -Please come back in 1 month to see if your blood sugars have improved -Take tylenol as needed for back pain, if it does not improve we can refer you to sports medicine  -Make sure you schedule your colonoscopy -Nice seeing you again, take care!  General Instructions:   Thank you for bringing your medicines today. This helps [place] keep you safe from mistakes.   Progress Toward Treatment Goals:  Treatment Goal 01/06/2014  Hemoglobin A1C unchanged  Blood pressure at goal    Self Care Goals & Plans:  Self Care Goal 02/20/2014  Manage my medications take my medicines as prescribed; bring my medications to every visit; refill my medications on time; follow the sick day instructions if I am sick  Monitor my health keep track of my blood glucose; keep track of my blood pressure; check my feet daily  Eat healthy foods eat more vegetables; eat fruit for snacks and desserts; eat baked foods instead of fried foods; eat foods that are low in salt; eat smaller portions; drink diet soda or water instead of juice or soda  Be physically active find an activity I enjoy  Meeting treatment goals -    Home Blood Glucose Monitoring 01/06/2014  Check my blood sugar once a day  When to check my blood sugar after lunch     Care Management & Community Referrals:  Referral 01/25/2013  Referrals made for care management support diabetes educator

## 2014-05-01 NOTE — Assessment & Plan Note (Addendum)
-  Pt to have screening colonoscopy rescheduled soon.  -Obtain screening HIV Ab at next visit.

## 2014-05-01 NOTE — Assessment & Plan Note (Signed)
Assessment: Pt with moderate to well-controlled hypertension compliant with one-class anti-hypertensive therapy who presents with blood pressure of 151/82 in setting of pain.   Plan:  -BP 151/82 not at goal <140/90 in setting of pain  -Continue enalapril 10 mg daily  -Last CMP on 04/06/14 with stable CKD Stage 3 (Cr 1.53 and GFR 37)

## 2014-05-01 NOTE — Assessment & Plan Note (Addendum)
Assessment: Pt with last A1c of 9.1 on 01/06/14 compliant with insulin and oral hypoglycemic therapy with recent symptomatic hypoglycemia who presents with CBG of 231 and mildly improved A1c of 8.7.   Plan:  -A1c 8.7 not at goal <7, increase pioglitazone from 15 mg to 30 mg daily and decrease Lantus from 30 U daily to 25 U daily in setting of symptomatic hypoglycemia (consider BID dosing if continues to have symptomatic hypoglycemia) -BP 151/82 not at goal <140/90, continue enalapril 10 mg daily -LDL 72 at goal <100, continue simvastatin 40 mg daily -Last annual eye exam on 8/38/15 was normal -Last annual foot exam on 11/12/13  -Last annual urine microalbumin on 4/15 was normal, repeat at next visit  -BMI 29.15 at goal <30

## 2014-05-01 NOTE — Progress Notes (Signed)
Internal Medicine Clinic Attending  Case discussed with Dr. Rabbani soon after the resident saw the patient.  We reviewed the resident's history and exam and pertinent patient test results.  I agree with the assessment, diagnosis, and plan of care documented in the resident's note.  

## 2014-05-01 NOTE — Progress Notes (Signed)
Patient ID: [name], female   DOB: 11/19/1955, 59 y.o.   MRN: [id number]    Subjective:   Patient ID: [name] female   DOB: 03/20/1955 59 y.o.   MRN: [id number]  HPI: [name] is a 59 y.o. pleasant woman with past medical history of insulin-dependent Type II DM, hypertension, hyperlipidemia, CKD Stage 3, lumbar DDD, chronic urinary and anxiety/depression who presents with follow-up of diabetes and chief complaint of low back pain.   Her last A1c on 9.1 on 12/27/13. She checks her blood sugar 2-3 times a day and brought her glucose meter in today which reveals range of 59-192 with five readings below 70 and symptoms of hypoglycemia. She reports compliance with taking Lantus 30 U at bedtime and actos 15 mg daily. She has chronic polydipsia and neuropathy (not currently on medication) but denies blurry vision, polyuria, polyphagia, or foot injury/ulcer. She tries to follow a healthy diet but does not exercise. She has gained 4 lbs since last office visit in January.    She reports chronic low back pain for the past year. Xray of lumbar spine on 09/12/12 revealed multilevel degenerative disc disease most significant at L1-2 with focal kyphosis. She reports the pain is most located on the left side of her back with no radiation down leg. She denies LE weakness, paraesthesias, urinary incontinence from baseline, bowel incontinence, recent fall/injury/trauma, or difficultly with ambulation. She has never had epidural corticosteroid injection before. She has been using OTC tylenol arthritis which controls her pain. She does not use NSAIDs due to her chronic kidney disease.   She is compliant with taking enalapril daily for hypertension. She denies headache, chest pain, LE edema, or lightheadedness.   She is in the process of re-scheduling her colonoscopy.    Past Medical History  Diagnosis Date  . Diabetes mellitus type II, uncontrolled   . Hypertension   . Hyperlipidemia   .  Ovarian cyst, left   . Anemia     due to menorrhagia, BL 8-10  . Vaginal cyst     nabothian and bartholin  . CKD (chronic kidney disease) stage 3, GFR 30-59 ml/min     baseline creatinine 1.4-1.7  . Anxiety   . Depression   . Postmenopausal bleeding 06/12/2008  . 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 UTI (lower urinary tract infection)   . IBS (irritable bowel syndrome)   . Chronic back pain   . Chronic abdominal pain   . DDD (degenerative disc disease), lumbar   . Bilateral renal cysts 01/15/2009    Qualifier: Diagnosis of  By: Tyrell Antonio MD, Bel[place]    . Diabetes mellitus without complication   . History of palpitations     evaluated recently 12'15   Current Outpatient Prescriptions  Medication Sig Dispense Refill  . acetaminophen (TYLENOL) 325 MG tablet Take 2 tablets (650 mg total) by mouth every 6 (six) hours as needed for moderate pain.    Marland Kitchen aspirin EC 81 MG tablet Take 81 mg by mouth every morning.     . cephALEXin (KEFLEX) 500 MG capsule Take 1 capsule (500 mg total) by mouth 2 (two) times daily. 10 capsule 0  . enalapril (VASOTEC) 10 MG tablet TAKE 1 TABLET (10 MG TOTAL) BY MOUTH DAILY. 30 tablet 2  . Insulin Glargine (LANTUS SOLOSTAR) 100 UNIT/ML Solostar Pen Inject 25 Units into the skin daily at 10 pm. 15 mL 11  . pioglitazone (ACTOS) 30 MG tablet Take  1 tablet (30 mg total) by mouth daily. 30 tablet 5  . simvastatin (ZOCOR) 40 MG tablet TAKE 1 TABLET (40 MG TOTAL) BY MOUTH AT BEDTIME. 30 tablet 5  . [DISCONTINUED] pantoprazole (PROTONIX) 20 MG tablet Take 2 tablets (40 mg total) by mouth daily. 30 tablet 1  . [DISCONTINUED] sertraline (ZOLOFT) 100 MG tablet Take 1 tablet (100 mg total) by mouth daily. 30 tablet 2   No current facility-administered medications for this visit.   Family History  Problem Relation Age of Onset  . Stroke Father   . Heart attack Father     Had MI in his 32s  . Stomach cancer Paternal Grandmother   . Diabetes  Maternal Grandmother   . Cerebral palsy Daughter   . Anesthesia problems Neg Hx   . Hypotension Neg Hx   . Malignant hyperthermia Neg Hx   . Pseudochol deficiency Neg Hx    History   Social History  . Marital Status: Divorced    Spouse Name: N/A  . Number of Children: 1  . Years of Education: 12th grade   Occupational History  . 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 Smoking status: Never Smoker   . Smokeless tobacco: Never Used  . Alcohol Use: No  . Drug Use: No  . Sexual Activity: No   Other Topics Concern  . 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   Review of Systems: Review of Systems  Constitutional: Negative for fever and chills.       Weight gain  Eyes: Negative for blurred vision.  Respiratory: Negative for cough and shortness of breath.   Cardiovascular: Negative for chest pain and leg swelling.  Gastrointestinal: Negative for nausea, vomiting, abdominal pain, diarrhea and constipation.  Genitourinary: Negative for dysuria, urgency and frequency.       Chronic urinary incontinence   Musculoskeletal: Positive for back pain and joint pain (right elbow and left knee). Negative for falls.  Neurological: Positive for sensory change (chronic peripheral neuropathy). Negative for headaches.  Endo/Heme/Allergies: Positive for polydipsia (chronic).    Objective:  Physical Exam: Filed Vitals:   04/29/14 1542  BP: 151/82  Pulse: 82  Temp: 98.3 F (36.8 C)  TempSrc: Oral  Weight: 169 lb 14.4 oz (77.066 kg)  SpO2: 100%    Physical Exam  Constitutional: She is oriented to person, place, and time. She appears well-developed and well-nourished. No distress.  HENT:  Head: Normocephalic and atraumatic.  Eyes: EOM are normal.  Neck: Normal range of motion. Neck supple.  Cardiovascular: Normal rate, regular rhythm and normal heart sounds.   Pulmonary/Chest: Effort normal and breath sounds  normal. No respiratory distress. She has no wheezes. She has no rales.  Abdominal: Soft. Bowel sounds are normal. She exhibits no distension. There is no tenderness. There is no rebound and no guarding.  Musculoskeletal: Normal range of motion. She exhibits no edema or tenderness.  Neurological: She is alert and oriented to person, place, and time.  Normal 5/5 muscle strength throughout with normal sensation to light touch of extremities. Negative b/l straight leg test.   Skin: Skin is warm and dry. No rash noted. She is not diaphoretic. No erythema. No pallor.  Psychiatric: She has a normal mood and affect. Her behavior is normal. Judgment and thought content normal.     Assessment & Plan:   Please see problem list for problem-based assessment and plan

## 2014-05-01 NOTE — Assessment & Plan Note (Signed)
Assessment: Pt with chronic left-sided low back pain without sciatica of 1-year duration with last xray imaging on 09/12/12 with DDD at L1-2 who presents with well-controlled pain.   Plan:  -Pt instructed to continue OTC tylenol PRN pain -If symptoms do not improve consider referral to sports medicine

## 2014-05-07 ENCOUNTER — Emergency Department (INDEPENDENT_AMBULATORY_CARE_PROVIDER_SITE_OTHER)
Admission: EM | Admit: 2014-05-07 | Discharge: 2014-05-07 | Disposition: A | Discharge status: Home / Self Care | Payer: Self-pay | Source: Home / Self Care | Attending: Family Medicine | Admitting: Family Medicine

## 2014-05-07 ENCOUNTER — Encounter (HOSPITAL_COMMUNITY): Payer: Self-pay | Admitting: Emergency Medicine

## 2014-05-07 DIAGNOSIS — H6692 Otitis media, unspecified, left ear: Secondary | ICD-10-CM

## 2014-05-07 MED ORDER — NEOMYCIN-POLYMYXIN-HC 3.5-10000-1 OT SUSP: 4.0000 [drp] | Freq: Three times a day (TID) | OTIC | Status: DC

## 2014-05-07 NOTE — ED Provider Notes (Signed)
[name] is a 59 y.o. female who presents to Urgent Care today for left ear pain present for 2 days. No runny nose cough congestion fevers chills nausea vomiting or diarrhea. No treatment tried yet. Patient feels well otherwise. She has the pain is worse when she sticks Q-tip in her ear.   Past Medical History  Diagnosis Date  . Diabetes mellitus type II, uncontrolled   . Hypertension   . Hyperlipidemia   . Ovarian cyst, left   . Anemia     due to menorrhagia, BL 8-10  . Vaginal cyst     nabothian and bartholin  . CKD (chronic kidney disease) stage 3, GFR 30-59 ml/min     baseline creatinine 1.4-1.7  . Anxiety   . Depression   . Postmenopausal bleeding 06/12/2008  . 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 UTI (lower urinary tract infection)   . IBS (irritable bowel syndrome)   . Chronic back pain   . Chronic abdominal pain   . DDD (degenerative disc disease), lumbar   . Bilateral renal cysts 01/15/2009    Qualifier: Diagnosis of  By: Tyrell Antonio MD, [place]    . Diabetes mellitus without complication   . History of palpitations     evaluated recently 12'15   Past Surgical History  Procedure Laterality Date  . Cardiac surgery      to repair congenital defect as a child- 48months old   History  Substance Use Topics  . Smoking status: Never Smoker   . Smokeless tobacco: Never Used  . Alcohol Use: No   ROS as above Medications: No current facility-administered medications for this encounter.   Current Outpatient Prescriptions  Medication Sig Dispense Refill  . aspirin EC 81 MG tablet Take 81 mg by mouth every morning.     . enalapril (VASOTEC) 10 MG tablet TAKE 1 TABLET (10 MG TOTAL) BY MOUTH DAILY. 30 tablet 2  . Insulin Glargine (LANTUS SOLOSTAR) 100 UNIT/ML Solostar Pen Inject 25 Units into the skin daily at 10 pm. 15 mL 11  . simvastatin (ZOCOR) 40 MG tablet TAKE 1 TABLET (40 MG TOTAL) BY MOUTH AT BEDTIME. 30 tablet 5  . acetaminophen (TYLENOL)  325 MG tablet Take 2 tablets (650 mg total) by mouth every 6 (six) hours as needed for moderate pain.    Marland Kitchen neomycin-polymyxin-hydrocortisone (CORTISPORIN) 3.5-10000-1 otic suspension Place 4 drops into the left ear 3 (three) times daily. 10 mL 0  . pioglitazone (ACTOS) 30 MG tablet Take 1 tablet (30 mg total) by mouth daily. 30 tablet 5  . [DISCONTINUED] pantoprazole (PROTONIX) 20 MG tablet Take 2 tablets (40 mg total) by mouth daily. 30 tablet 1  . [DISCONTINUED] sertraline (ZOLOFT) 100 MG tablet Take 1 tablet (100 mg total) by mouth daily. 30 tablet 2   No Known Allergies   Exam:  BP 153/83 mmHg  Pulse 90  Temp(Src) 98.5 F (36.9 C) (Oral)  Resp 16  SpO2 97% Gen: Well NAD HEENT: EOMI,  MMM right tympanic membrane is normal. Left tympanic membrane is normal. Left ear canal is erythematous. Mastoids nontender bilaterally. Lungs: Normal work of breathing. CTABL Heart: RRR no MRG Abd: NABS, Soft. Nondistended, Nontender Exts: Brisk capillary refill, warm and well perfused.   No results found for this or any previous visit (from the past 24 hour(s)). No results found.  Assessment and Plan: 59 y.o. female with otitis externa. Treat with Cortisporin drops.  Discussed warning signs or symptoms.  Please see discharge instructions. Patient expresses understanding.     [name], MD 05/07/14 [phone]

## 2014-05-07 NOTE — ED Notes (Signed)
C/o left ear pain onset 2 days; also reports nasal congestion Denies fevers, chills Alert, no signs of acute distress.

## 2014-05-07 NOTE — Discharge Instructions (Signed)
Thank you for coming in today. ° °Otitis Externa °Otitis externa is a bacterial or fungal infection of the outer ear canal. This is the area from the eardrum to the outside of the ear. Otitis externa is sometimes called "swimmer's ear." °CAUSES  °Possible causes of infection include: °· Swimming in dirty water. °· Moisture remaining in the ear after swimming or bathing. °· Mild injury (trauma) to the ear. °· Objects stuck in the ear (foreign body). °· Cuts or scrapes (abrasions) on the outside of the ear. °SIGNS AND SYMPTOMS  °The first symptom of infection is often itching in the ear canal. Later signs and symptoms may include swelling and redness of the ear canal, ear pain, and yellowish-white fluid (pus) coming from the ear. The ear pain may be worse when pulling on the earlobe. °DIAGNOSIS  °Your health care provider will perform a physical exam. A sample of fluid may be taken from the ear and examined for bacteria or fungi. °TREATMENT  °Antibiotic ear drops are often given for 10 to 14 days. Treatment may also include pain medicine or corticosteroids to reduce itching and swelling. °HOME CARE INSTRUCTIONS  °· Apply antibiotic ear drops to the ear canal as prescribed by your health care provider. °· Take medicines only as directed by your health care provider. °· If you have diabetes, follow any additional treatment instructions from your health care provider. °· Keep all follow-up visits as directed by your health care provider. °PREVENTION  °· Keep your ear dry. Use the corner of a towel to absorb water out of the ear canal after swimming or bathing. °· Avoid scratching or putting objects inside your ear. This can damage the ear canal or remove the protective wax that lines the canal. This makes it easier for bacteria and fungi to grow. °· Avoid swimming in lakes, polluted water, or poorly chlorinated pools. °· You may use ear drops made of rubbing alcohol and vinegar after swimming. Combine equal parts of  white vinegar and alcohol in a bottle. Put 3 or 4 drops into each ear after swimming. °SEEK MEDICAL CARE IF:  °· You have a fever. °· Your ear is still red, swollen, painful, or draining pus after 3 days. °· Your redness, swelling, or pain gets worse. °· You have a severe headache. °· You have redness, swelling, pain, or tenderness in the area behind your ear. °MAKE SURE YOU:  °· Understand these instructions. °· Will watch your condition. °· Will get help right away if you are not doing well or get worse. °Document Released: 01/24/2005 Document Revised: 06/10/2013 Document Reviewed: 02/10/2011 °[employer]® Patient Information ©2015 [employer]. This information is not intended to replace advice given to you by your health care provider. Make sure you discuss any questions you have with your health care provider. ° °

## 2014-05-11 ENCOUNTER — Other Ambulatory Visit: Payer: Self-pay | Admitting: Internal Medicine

## 2014-06-06 ENCOUNTER — Encounter: Payer: Self-pay | Admitting: Internal Medicine

## 2014-06-06 ENCOUNTER — Ambulatory Visit (INDEPENDENT_AMBULATORY_CARE_PROVIDER_SITE_OTHER): Payer: Self-pay | Admitting: Internal Medicine

## 2014-06-06 VITALS — BP 148/84 | HR 96 | Temp 98.5°F | Ht 64.0 in | Wt 168.9 lb

## 2014-06-06 DIAGNOSIS — I129 Hypertensive chronic kidney disease with stage 1 through stage 4 chronic kidney disease, or unspecified chronic kidney disease: Secondary | ICD-10-CM

## 2014-06-06 DIAGNOSIS — I10 Essential (primary) hypertension: Secondary | ICD-10-CM

## 2014-06-06 DIAGNOSIS — N183 Chronic kidney disease, stage 3 unspecified: Secondary | ICD-10-CM

## 2014-06-06 DIAGNOSIS — E1122 Type 2 diabetes mellitus with diabetic chronic kidney disease: Secondary | ICD-10-CM

## 2014-06-06 DIAGNOSIS — Z794 Long term (current) use of insulin: Secondary | ICD-10-CM

## 2014-06-06 LAB — GLUCOSE, CAPILLARY: GLUCOSE-CAPILLARY: 343 mg/dL — AB (ref 70–99)

## 2014-06-06 MED ORDER — ENALAPRIL MALEATE 10 MG PO TABS: 20.0000 mg | ORAL_TABLET | Freq: Every day | ORAL | Status: DC

## 2014-06-06 MED ORDER — INSULIN GLARGINE 100 UNIT/ML SOLOSTAR PEN: 23.0000 [IU] | PEN_INJECTOR | Freq: Every day | SUBCUTANEOUS | Status: DC

## 2014-06-06 NOTE — Patient Instructions (Signed)
It was a pleasure meeting you today, [name].  - Decrease Lantus to 23 units daily - Increase Enalapril to 20 mg daily - Urine microalbumin today - Follow up appointment in 2-3 weeks. Please check 3 times daily for 3 days before your appointment so that we can see how your blood sugars are doing.   General Instructions:   Please bring your medicines with you each time you come to clinic.  Medicines may include prescription medications, over-the-counter medications, herbal remedies, eye drops, vitamins, or other pills.   Progress Toward Treatment Goals:  Treatment Goal 01/06/2014  Hemoglobin A1C unchanged  Blood pressure at goal    Self Care Goals & Plans:  Self Care Goal 06/06/2014  Manage my medications take my medicines as prescribed; bring my medications to every visit; refill my medications on time  Monitor my health keep track of my blood glucose; bring my glucose meter and log to each visit; keep track of my blood pressure  Eat healthy foods eat more vegetables; eat foods that are low in salt; eat baked foods instead of fried foods  Be physically active find an activity I enjoy; take a walk every day  Meeting treatment goals -    Home Blood Glucose Monitoring 01/06/2014  Check my blood sugar once a day  When to check my blood sugar after lunch     Care Management & Community Referrals:  Referral 01/25/2013  Referrals made for care management support diabetes educator

## 2014-06-07 LAB — MICROALBUMIN / CREATININE URINE RATIO
CREATININE, URINE: 89.8 mg/dL
MICROALB UR: 0.5 mg/dL (ref <2.0)
MICROALB/CREAT RATIO: 5.6 mg/g (ref 0.0–30.0)

## 2014-06-08 MED ORDER — ENALAPRIL MALEATE 10 MG PO TABS: 10.0000 mg | ORAL_TABLET | Freq: Every day | ORAL | Status: DC

## 2014-06-08 NOTE — Assessment & Plan Note (Addendum)
Lab Results  Component Value Date   HGBA1C 8.7 04/29/2014   HGBA1C 9.1 01/06/2014   HGBA1C 8.8 09/10/2013     Assessment: Diabetes control: fair control Progress toward A1C goal:  unchanged Comments: Patient still getting hypoglycemia despite being off of her pioglitazone for 2 months. Concerned that she may be getting higher blood sugars in the afternoon since CBG in office was 343. I have asked patient to check 2-3 times daily for 3 days before her next visit so we can get a better idea of her blood sugars throughout the day.   Plan: Medications:  Decrease Lantus to 22 units daily. Stop pioglitazone.  Home glucose monitoring: Frequency: 2 times a day Timing: before breakfast, before dinner Instruction/counseling given: reminded to bring blood glucose meter & log to each visit, reminded to bring medications to each visit and discussed diet Self management tools provided: copy of home glucose meter download Other plans:  - Check blood sugars in 3 weeks - Check urine microalbumin  - If adjusting Lantus remember that her pen has only even numbered doses  - Reviewed treating hypoglycemia

## 2014-06-08 NOTE — Progress Notes (Signed)
   Subjective:    Patient ID: [name], female    DOB: August 08, 1955, 59 y.o.   MRN: [id number]  HPI [name] is a 59yo woman with PMHx of HTN, GERD, Type 2 DM, HLD, and CKD Stage 3 who presents today for follow up for her diabetes.   Her last HbA1c was 8.9 on 04/29/14. At her last visit, her Lantus was decreased to 25 units daily and her Pioglitazone was increased to 30 mg daily due to frequent hypoglycemia. However, patient reports she has not taken her Pioglitazone for the past 2 months due to being unable to afford this medication. She reports taking Lantus 26 units daily since her pen does only has even numbered doses. She notes continued hypoglycemia with breakfast CBGs frequently in the 60s. She states she does not know when she is having hypoglycemia most times. She notes occasional sweating and shakiness, but not every time.    Review of Systems General: Denies fever, chills, night sweats, changes in weight, changes in appetite HEENT: Denies headaches, ear pain, changes in vision, rhinorrhea, sore throat CV: Denies CP, palpitations, SOB, orthopnea Pulm: Denies SOB, cough, wheezing GI: Denies abdominal pain, nausea, vomiting, diarrhea, constipation, melena, hematochezia GU: Denies dysuria, hematuria, frequency Msk: Denies muscle cramps, joint pains Neuro: Denies weakness, numbness, tingling Skin: Denies rashes, bruising    Objective:   Physical Exam General: sitting up in bed, NAD  HEENT: [name]/AT, EOMI, sclera anicteric, mucus membranes moist CV: RRR, no m/g/r Pulm: CTA bilaterally, breaths non-labored Abd: BS+, soft, non-tender, non-distended Ext: warm, no edema, moves all Neuro: alert and oriented x 3, no focal deficits     Assessment & Plan:  Please refer to A&P documentation.

## 2014-06-08 NOTE — Assessment & Plan Note (Signed)
BP Readings from Last 3 Encounters:  06/06/14 148/84  05/07/14 153/83  04/29/14 151/82    Lab Results  Component Value Date   NA 136 04/06/2014   K 3.9 04/06/2014   CREATININE 1.53* 04/06/2014    Assessment: Blood pressure control: mildly elevated Progress toward BP goal:  unchanged Comments: BP mildly elevated. I discussed increasing Enalapril to 20 mg daily with patient, but she refused saying that her blood pressure is "fine" and she does not want it to drop too low. We agreed that this would be discussed again at her next visit.   Plan: Medications:  Continue Enalapril 10 mg daily. Consider increasing to 20 mg daily if patient agreeable.  Other plans:  - BP recheck in 3 weeks

## 2014-06-09 ENCOUNTER — Encounter (HOSPITAL_COMMUNITY): Payer: Self-pay | Admitting: Emergency Medicine

## 2014-06-09 DIAGNOSIS — Z862 Personal history of diseases of the blood and blood-forming organs and certain disorders involving the immune mechanism: Secondary | ICD-10-CM | POA: Insufficient documentation

## 2014-06-09 DIAGNOSIS — R109 Unspecified abdominal pain: Secondary | ICD-10-CM | POA: Insufficient documentation

## 2014-06-09 DIAGNOSIS — Z8719 Personal history of other diseases of the digestive system: Secondary | ICD-10-CM | POA: Insufficient documentation

## 2014-06-09 DIAGNOSIS — Z87440 Personal history of urinary (tract) infections: Secondary | ICD-10-CM | POA: Insufficient documentation

## 2014-06-09 DIAGNOSIS — E1165 Type 2 diabetes mellitus with hyperglycemia: Secondary | ICD-10-CM | POA: Insufficient documentation

## 2014-06-09 DIAGNOSIS — Z8659 Personal history of other mental and behavioral disorders: Secondary | ICD-10-CM | POA: Insufficient documentation

## 2014-06-09 DIAGNOSIS — Z8774 Personal history of (corrected) congenital malformations of heart and circulatory system: Secondary | ICD-10-CM | POA: Insufficient documentation

## 2014-06-09 DIAGNOSIS — Z79899 Other long term (current) drug therapy: Secondary | ICD-10-CM | POA: Insufficient documentation

## 2014-06-09 DIAGNOSIS — I129 Hypertensive chronic kidney disease with stage 1 through stage 4 chronic kidney disease, or unspecified chronic kidney disease: Secondary | ICD-10-CM | POA: Insufficient documentation

## 2014-06-09 DIAGNOSIS — Z7982 Long term (current) use of aspirin: Secondary | ICD-10-CM | POA: Insufficient documentation

## 2014-06-09 DIAGNOSIS — Z8742 Personal history of other diseases of the female genital tract: Secondary | ICD-10-CM | POA: Insufficient documentation

## 2014-06-09 DIAGNOSIS — G8929 Other chronic pain: Secondary | ICD-10-CM | POA: Insufficient documentation

## 2014-06-09 DIAGNOSIS — Z794 Long term (current) use of insulin: Secondary | ICD-10-CM | POA: Insufficient documentation

## 2014-06-09 DIAGNOSIS — N183 Chronic kidney disease, stage 3 (moderate): Secondary | ICD-10-CM | POA: Insufficient documentation

## 2014-06-09 DIAGNOSIS — R110 Nausea: Secondary | ICD-10-CM | POA: Insufficient documentation

## 2014-06-09 DIAGNOSIS — Z9889 Other specified postprocedural states: Secondary | ICD-10-CM | POA: Insufficient documentation

## 2014-06-09 DIAGNOSIS — E785 Hyperlipidemia, unspecified: Secondary | ICD-10-CM | POA: Insufficient documentation

## 2014-06-09 LAB — URINALYSIS, ROUTINE W REFLEX MICROSCOPIC
Bilirubin Urine: NEGATIVE
Glucose, UA: NEGATIVE mg/dL
HGB URINE DIPSTICK: NEGATIVE
Ketones, ur: NEGATIVE mg/dL
Nitrite: NEGATIVE
Protein, ur: NEGATIVE mg/dL
Specific Gravity, Urine: 1.019 (ref 1.005–1.030)
Urobilinogen, UA: 0.2 mg/dL (ref 0.0–1.0)
pH: 6 (ref 5.0–8.0)

## 2014-06-09 LAB — URINE MICROSCOPIC-ADD ON

## 2014-06-09 LAB — COMPREHENSIVE METABOLIC PANEL
ALT: 27 U/L (ref 14–54)
AST: 28 U/L (ref 15–41)
Albumin: 4.1 g/dL (ref 3.5–5.0)
Alkaline Phosphatase: 124 U/L (ref 38–126)
Anion gap: 12 (ref 5–15)
BILIRUBIN TOTAL: 0.7 mg/dL (ref 0.3–1.2)
BUN: 34 mg/dL — ABNORMAL HIGH (ref 6–20)
CALCIUM: 9.8 mg/dL (ref 8.9–10.3)
CO2: 27 mmol/L (ref 22–32)
Chloride: 100 mmol/L — ABNORMAL LOW (ref 101–111)
Creatinine, Ser: 1.85 mg/dL — ABNORMAL HIGH (ref 0.44–1.00)
GFR calc non Af Amer: 29 mL/min — ABNORMAL LOW (ref >60)
GFR, EST AFRICAN AMERICAN: 34 mL/min — AB (ref >60)
Glucose, Bld: 303 mg/dL — ABNORMAL HIGH (ref 70–99)
Potassium: 4.6 mmol/L (ref 3.5–5.1)
SODIUM: 139 mmol/L (ref 135–145)
Total Protein: 7.4 g/dL (ref 6.5–8.1)

## 2014-06-09 LAB — CBC WITH DIFFERENTIAL/PLATELET
BASOS PCT: 1 % (ref 0–1)
Basophils Absolute: 0 10*3/uL (ref 0.0–0.1)
EOS ABS: 0.1 10*3/uL (ref 0.0–0.7)
EOS PCT: 1 % (ref 0–5)
HCT: 37.4 % (ref 36.0–46.0)
HEMOGLOBIN: 12.6 g/dL (ref 12.0–15.0)
LYMPHS ABS: 3.3 10*3/uL (ref 0.7–4.0)
LYMPHS PCT: 39 % (ref 12–46)
MCH: 29.6 pg (ref 26.0–34.0)
MCHC: 33.7 g/dL (ref 30.0–36.0)
MCV: 88 fL (ref 78.0–100.0)
MONOS PCT: 6 % (ref 3–12)
Monocytes Absolute: 0.5 10*3/uL (ref 0.1–1.0)
NEUTROS ABS: 4.7 10*3/uL (ref 1.7–7.7)
NEUTROS PCT: 55 % (ref 43–77)
PLATELETS: 205 10*3/uL (ref 150–400)
RBC: 4.25 MIL/uL (ref 3.87–5.11)
RDW: 12.3 % (ref 11.5–15.5)
WBC: 8.6 10*3/uL (ref 4.0–10.5)

## 2014-06-09 LAB — CBG MONITORING, ED: GLUCOSE-CAPILLARY: 270 mg/dL — AB (ref 70–99)

## 2014-06-09 NOTE — Progress Notes (Signed)
[facility] Clinic Attending  Case discussed with Dr. Rivet at the time of the visit.  We reviewed the resident's history and exam and pertinent patient test results.  I agree with the assessment, diagnosis, and plan of care documented in the resident's note.  

## 2014-06-09 NOTE — ED Notes (Signed)
Pt reports RLQ abd pain x " a few days" and increasing diaphoresis; pt reports nausesa without vomiting and diarrhea; pt states she still has her appendix and R ovary but denies problems in the past; pt denies urinary chagnes also

## 2014-06-10 ENCOUNTER — Encounter: Payer: Self-pay | Admitting: Internal Medicine

## 2014-06-10 ENCOUNTER — Ambulatory Visit (INDEPENDENT_AMBULATORY_CARE_PROVIDER_SITE_OTHER): Payer: Self-pay | Admitting: Internal Medicine

## 2014-06-10 ENCOUNTER — Emergency Department (HOSPITAL_COMMUNITY)
Admission: EM | Admit: 2014-06-10 | Discharge: 2014-06-10 | Disposition: A | Discharge status: Home / Self Care | Payer: Self-pay | Attending: Emergency Medicine | Admitting: Emergency Medicine

## 2014-06-10 VITALS — BP 135/71 | HR 80 | Temp 97.9°F | Ht 64.0 in | Wt 168.3 lb

## 2014-06-10 DIAGNOSIS — R739 Hyperglycemia, unspecified: Secondary | ICD-10-CM

## 2014-06-10 DIAGNOSIS — N3000 Acute cystitis without hematuria: Secondary | ICD-10-CM

## 2014-06-10 DIAGNOSIS — E1122 Type 2 diabetes mellitus with diabetic chronic kidney disease: Secondary | ICD-10-CM

## 2014-06-10 DIAGNOSIS — B9689 Other specified bacterial agents as the cause of diseases classified elsewhere: Secondary | ICD-10-CM

## 2014-06-10 DIAGNOSIS — N183 Chronic kidney disease, stage 3 unspecified: Secondary | ICD-10-CM

## 2014-06-10 DIAGNOSIS — R109 Unspecified abdominal pain: Secondary | ICD-10-CM

## 2014-06-10 DIAGNOSIS — Z794 Long term (current) use of insulin: Secondary | ICD-10-CM

## 2014-06-10 DIAGNOSIS — N281 Cyst of kidney, acquired: Secondary | ICD-10-CM

## 2014-06-10 DIAGNOSIS — I129 Hypertensive chronic kidney disease with stage 1 through stage 4 chronic kidney disease, or unspecified chronic kidney disease: Secondary | ICD-10-CM

## 2014-06-10 DIAGNOSIS — N390 Urinary tract infection, site not specified: Secondary | ICD-10-CM

## 2014-06-10 DIAGNOSIS — I10 Essential (primary) hypertension: Secondary | ICD-10-CM

## 2014-06-10 MED ORDER — METFORMIN HCL 500 MG PO TABS: 500.0000 mg | ORAL_TABLET | Freq: Two times a day (BID) | ORAL | Status: DC

## 2014-06-10 MED ORDER — CIPROFLOXACIN HCL 500 MG PO TABS: 500.0000 mg | ORAL_TABLET | Freq: Every day | ORAL | Status: AC

## 2014-06-10 MED ORDER — INSULIN GLARGINE 100 UNIT/ML SOLOSTAR PEN: 22.0000 [IU] | PEN_INJECTOR | Freq: Every day | SUBCUTANEOUS | Status: DC

## 2014-06-10 NOTE — Assessment & Plan Note (Signed)
Lab Results  Component Value Date   HGBA1C 8.7 04/29/2014   HGBA1C 9.1 01/06/2014   HGBA1C 8.8 09/10/2013     Assessment: Diabetes control: fair control Progress toward A1C goal:  unchanged Comments: Patient reports taking Lantus 22 units daily and not experiencing hypoglycemia symptoms since last visit. Her CBGs still are elevated in the evenings. She reports being on Metformin previously but this was discontinued for an unknown reason.   Plan: Medications:  Continue Lantus 22 units QHS. Start Metformin 500 mg BID.  Home glucose monitoring: Frequency: 2 times a day Timing: before breakfast, before dinner Other plans:  - Pt instructed to check blood sugars twice daily for next week - Appt in 1 week to reassess blood sugar control

## 2014-06-10 NOTE — ED Provider Notes (Signed)
CSN: [id number]     Arrival date & time 06/09/14  2132 History  This chart was scribed for Maria Fraise, MD by [name], ED Scribe. This patient was seen in room A09C/A09C and the patient's care was started at 12:51 AM.    Chief Complaint  Patient presents with  . Abdominal Pain   Patient is a 59 y.o. female presenting with abdominal pain. The history is provided by the patient. No language interpreter was used.  Abdominal Pain Pain location:  RLQ Pain radiates to:  Does not radiate Pain severity:  Mild Onset quality:  Sudden Duration:  1 day Timing:  Constant Progression:  Waxing and waning Chronicity:  Recurrent Context: not previous surgeries   Relieved by:  Acetaminophen Worsened by:  Nothing tried Ineffective treatments:  None tried Associated symptoms: nausea   Associated symptoms: no chest pain, no cough, no diarrhea, no dysuria, no fever, no vaginal bleeding and no vomiting   Risk factors: not pregnant     HPI Comments: [name] is a 59 y.o. female who presents to the Emergency Department complaining of sudden onset RLQ pain and mild nausea beginning last night. She reports prior experience with similar symptoms, "off and on" for some time, and has been seen by her gynecologist for this issue without conclusive diagnosis. Patient reports that her symptoms improved with Tylenol, states "I still feel it a little bit but figured with my knees hurting too I might as well come in." She denies appetite change, fever, vomiting, diarrhea, chest pain, cough, dysuria, vaginal bleeding.  Patient states she still has her appendix, gallbladder and all reproductive organs. She no longer menstruates.   Past Medical History  Diagnosis Date  . Diabetes mellitus type II, uncontrolled   . Hypertension   . Hyperlipidemia   . Ovarian cyst, left   . Anemia     due to menorrhagia, BL 8-10  . Vaginal cyst     nabothian and bartholin  . CKD (chronic kidney disease) stage 3, GFR 30-59  ml/min     baseline creatinine 1.4-1.7  . Anxiety   . Depression   . Postmenopausal bleeding 06/12/2008  . 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 UTI (lower urinary tract infection)   . IBS (irritable bowel syndrome)   . Chronic back pain   . Chronic abdominal pain   . DDD (degenerative disc disease), lumbar   . Bilateral renal cysts 01/15/2009    Qualifier: Diagnosis of  By: Tyrell Antonio MD, [place]    . Diabetes mellitus without complication   . History of palpitations     evaluated recently 12'15   Past Surgical History  Procedure Laterality Date  . Cardiac surgery      to repair congenital defect as a child- 72months old   Family History  Problem Relation Age of Onset  . Stroke Father   . Heart attack Father     Had MI in his 58s  . Stomach cancer Paternal Grandmother   . Diabetes Maternal Grandmother   . Cerebral palsy Daughter   . Anesthesia problems Neg Hx   . Hypotension Neg Hx   . Malignant hyperthermia Neg Hx   . Pseudochol deficiency Neg Hx    History  Substance Use Topics  . Smoking status: Never Smoker   . Smokeless tobacco: Never Used  . Alcohol Use: No   OB History    Gravida Para Term Preterm AB TAB SAB Ectopic Multiple Living  3 1 1  2  2   1      Review of Systems  Constitutional: Negative for fever and appetite change.  Respiratory: Negative for cough.   Cardiovascular: Negative for chest pain.  Gastrointestinal: Positive for nausea and abdominal pain. Negative for vomiting and diarrhea.  Genitourinary: Negative for dysuria and vaginal bleeding.  All other systems reviewed and are negative.  Allergies  Review of patient's allergies indicates no known allergies.  Home Medications   Prior to Admission medications   Medication Sig Start Date End Date Taking? Authorizing Provider  acetaminophen (TYLENOL) 325 MG tablet Take 2 tablets (650 mg total) by mouth every 6 (six) hours as needed for moderate pain. 04/29/14   [name], MD  aspirin EC 81 MG tablet Take 81 mg by mouth every morning.     Historical Provider, MD  enalapril (VASOTEC) 10 MG tablet Take 1 tablet (10 mg total) by mouth daily. 06/08/14   Juliet Rude, MD  Insulin Glargine (LANTUS SOLOSTAR) 100 UNIT/ML Solostar Pen Inject 23 Units into the skin daily at 10 pm. 06/06/14   Juliet Rude, MD  neomycin-polymyxin-hydrocortisone (CORTISPORIN) 3.5-10000-1 otic suspension Place 4 drops into the left ear 3 (three) times daily. Patient not taking: Reported on 06/06/2014 05/07/14   [name], MD  pioglitazone (ACTOS) 30 MG tablet Take 1 tablet (30 mg total) by mouth daily. Patient not taking: Reported on 06/06/2014 04/29/14   [name], MD  simvastatin (ZOCOR) 40 MG tablet TAKE 1 TABLET (40 MG TOTAL) BY MOUTH AT BEDTIME. Patient not taking: Reported on 06/06/2014 11/26/13   [name], MD   BP 149/80 mmHg  Pulse 80  Temp(Src) 98.4 F (36.9 C) (Oral)  Resp 16  SpO2 97% Physical Exam  Nursing note and vitals reviewed.   CONSTITUTIONAL: Well developed/well nourished HEAD: Normocephalic/atraumatic EYES: EOMI/PERRL ENMT: Mucous membranes moist NECK: supple no meningeal signs SPINE/BACK:entire spine nontender CV: S1/S2 noted, no murmurs/rubs/gallops noted LUNGS: Lungs are clear to auscultation bilaterally, no apparent distress ABDOMEN: soft, nontender, no rebound or guarding, bowel sounds noted throughout abdomen GU:no cva tenderness NEURO: Pt is awake/alert/appropriate, moves all extremitiesx4.  No facial droop.   EXTREMITIES: pulses normal/equal, full ROM SKIN: warm, color normal PSYCH: no abnormalities of mood noted, alert and oriented to situation  ED Course  Procedures   DIAGNOSTIC STUDIES: Oxygen Saturation is 97% on RA, adequate by my interpretation.    COORDINATION OF CARE: 12:59 AM Discussed treatment plan with pt at bedside and pt agreed to plan.  Pt well appearing No focal tenderness She is talkative, appropriate  and admits this pain has been present on/off for awhile and has had workup previously I doubt this represents acute appendicitis or other acute abd/gyn emergency Advised PCP followup Labs at [facility] - Abnormal; Notable for the following:    Chloride 100 (*)    Glucose, Bld 303 (*)    BUN 34 (*)    Creatinine, Ser 1.85 (*)    GFR calc non Af Amer 29 (*)    GFR calc Af Amer 34 (*)    All other components within normal limits  URINALYSIS, ROUTINE W REFLEX MICROSCOPIC - Abnormal; Notable for the following:    APPearance CLOUDY (*)    Leukocytes, UA LARGE (*)    All other components within normal limits  URINE MICROSCOPIC-ADD ON - Abnormal; Notable for the following:    Squamous Epithelial / LPF  FEW (*)    Bacteria, UA MANY (*)    All other components within normal limits  CBG MONITORING, ED - Abnormal; Notable for the following:    Glucose-Capillary 270 (*)    All other components within normal limits  CBC WITH DIFFERENTIAL/PLATELET    MDM   Final diagnoses:  Abdominal pain, unspecified abdominal location  Hyperglycemia    Nursing notes including past medical history and social history reviewed and considered in documentation Labs/vital reviewed myself and considered during evaluation    I personally performed the services described in this documentation, which was scribed in my presence. The recorded information has been reviewed and is accurate.       Maria Fraise, MD 06/10/14 [phone]

## 2014-06-10 NOTE — Progress Notes (Signed)
   Subjective:    Patient ID: [name], female    DOB: [birth year], 57 y.o.   MRN: [id number]  HPI [name] is a 59yo woman with PMHx of HTN, Type 2 DM, CKD Stage 3, HLD, and GERD who presents today for an acute visit. Patient was seen earlier today in the ED for RLQ abdominal pain. She reports the pain started last night and did not get any better by the morning so she went to the ED. The ED sent a UA which showed many bacteria, large leuks, and 7-10 WBCs. She was not prescribed antibiotics. She describes her pain as 5/10 in severity, achy, intermittent, occasionally radiating to the RUQ, and not associated with position or food. She reports some nausea, decreased appetite, and frequency. She denies fevers, chills, vomiting, dysuria, hematuria, back/flank pain. She reports Tylenol has relieved her pain " a little bit."    Review of Systems General: Denies night sweats, changes in weight HEENT: Denies headaches, ear pain, changes in vision, rhinorrhea, sore throat CV: Denies CP, palpitations, SOB, orthopnea Pulm: Denies SOB, cough, wheezing GI: Denies diarrhea, constipation, melena, hematochezia GU: See above  Msk: Denies muscle cramps, joint pains Neuro: Denies weakness, numbness, tingling Skin: Denies rashes, bruising    Objective:   Physical Exam General: sitting up in chair, appears anxious, speech pressured  HEENT: [place]/AT, EOMI, mucus membranes CV: RRR, no m/g/r Pulm: CTA bilaterally, breaths non-labored  Abd: BS+, soft, mild tenderness to palpation in the RLQ and suprapubic regions, no hepatosplenomegaly, no CVA tenderness Ext: warm, no edema, moves all Neuro: alert and oriented x 3, no focal deficits     Assessment & Plan:  Please refer to A&P documentation.

## 2014-06-10 NOTE — Patient Instructions (Signed)
It was a pleasure taking care of you today, [name].  1. Urinary tract infection - take Ciprofloxacin 500 mg daily for next 5 days  2. Diabetes - Start Metformin 500 mg twice a day - Continue Lantus 22 units at bedtime  - Please check your blood sugars twice a day (morning and evening) for 1 week  - Follow up appointment in 1 week   General Instructions:   Please bring your medicines with you each time you come to clinic.  Medicines may include prescription medications, over-the-counter medications, herbal remedies, eye drops, vitamins, or other pills.   Progress Toward Treatment Goals:  Treatment Goal 06/06/2014  Hemoglobin A1C unchanged  Blood pressure unchanged    Self Care Goals & Plans:  Self Care Goal 06/06/2014  Manage my medications take my medicines as prescribed; bring my medications to every visit; refill my medications on time  Monitor my health keep track of my blood glucose; bring my glucose meter and log to each visit; keep track of my blood pressure; check my feet daily  Eat healthy foods eat more vegetables; eat foods that are low in salt; eat baked foods instead of fried foods  Be physically active find an activity I enjoy; take a walk every day  Meeting treatment goals -    Home Blood Glucose Monitoring 06/06/2014  Check my blood sugar 2 times a day  When to check my blood sugar before breakfast; before dinner     Care Management & Community Referrals:  Referral 06/06/2014  Referrals made for care management support financial counselor

## 2014-06-10 NOTE — Discharge Instructions (Signed)
Abdominal (belly) pain can be caused by many things. any cases can be observed and treated at home after initial evaluation in the emergency department. Even though you are being discharged home, abdominal pain can be unpredictable. Therefore, you need a repeated exam if your pain does not resolve, returns, or worsens. Most patients with abdominal pain don't have to be admitted to the hospital or have surgery, but serious problems like appendicitis and gallbladder attacks can start out as nonspecific pain. Many abdominal conditions cannot be diagnosed in one visit, so follow-up evaluations are very important. °SEEK IMMEDIATE MEDICAL ATTENTION IF: °The pain does not go away or becomes severe, particularly over the next 8-12 hours.  °A temperature above 100.4F develops.  °Repeated vomiting occurs (multiple episodes).  °Blood is being passed in stools or vomit (bright red or black tarry stools).  °Return also if you develop chest pain, difficulty breathing, dizziness or fainting, or become confused, poorly responsive, or inconsolable. ° °

## 2014-06-10 NOTE — Assessment & Plan Note (Signed)
UA and symptoms of abdominal pain, nausea, and frequency consistent with a UTI. Given her renal function has decreased likely in the setting of decreased PO intake due to acute infection will give renally dosed ciprofloxacin.  - Start Ciprofloxacin 500 mg daily for 5 days - f/u urine culture - Pt encouraged to drink plenty of fluids

## 2014-06-10 NOTE — Assessment & Plan Note (Signed)
BP Readings from Last 3 Encounters:  06/10/14 135/71  06/06/14 148/84  05/07/14 153/83    Lab Results  Component Value Date   NA 139 06/09/2014   K 4.6 06/09/2014   CREATININE 1.85* 06/09/2014    Assessment: Blood pressure control: controlled Progress toward BP goal:  at goal Comments: BP controlled today.   Plan: Medications: Continue Enalapril 10 mg daily Other plans:  - Continue to monitor

## 2014-06-10 NOTE — Progress Notes (Signed)
INTERNAL MEDICINE TEACHING ATTENDING ADDENDUM - Jodeci Roarty, MD: I reviewed and discussed at the time of visit with the resident Dr. Rivet, the patient's medical history, physical examination, diagnosis and results of pertinent tests and treatment and I agree with the patient's care as documented.  

## 2014-06-10 NOTE — Assessment & Plan Note (Signed)
cmet checked in ED earlier shows Cr elevated at 1.85 (baseline ~1.4), GFR decreased to 29. Likely due to decreased PO intake from acute illness (UTI).  - Pt encouraged to drink plenty of fluids - Check bmet at next visit to ensure kidney function improving

## 2014-06-11 LAB — URINE CULTURE: Colony Count: 30000

## 2014-06-16 ENCOUNTER — Telehealth: Payer: Self-pay | Admitting: *Deleted

## 2014-06-16 NOTE — Telephone Encounter (Signed)
Pt called screaming, yelling and started to curse, she was upset about her BP medicine and it being increased, as i started to ask her questions the phone went dead, i called the pharmacy and spoke w/ pharmacist, she was given a script of enalapril 10mg  take 2 tablets daily today and it did cost more, i explained that that script was to be voided and they were to use the newer scripts that they had, he states he will adjust back to the 1 daily 10mg  and refund pt's money. He stated he would call her and explain, i called her back and on 3rd call she answered and stated her battery was going dead, i explained quickly that for now she is to continue the 10mg  daily and the pharmacy would be calling her, she said ok and hung up

## 2014-06-17 ENCOUNTER — Ambulatory Visit: Payer: Self-pay

## 2014-06-17 ENCOUNTER — Ambulatory Visit (INDEPENDENT_AMBULATORY_CARE_PROVIDER_SITE_OTHER): Payer: Self-pay | Admitting: Internal Medicine

## 2014-06-17 ENCOUNTER — Encounter: Payer: Self-pay | Admitting: Internal Medicine

## 2014-06-17 VITALS — BP 128/65 | HR 87 | Temp 98.4°F | Ht 64.0 in | Wt 167.1 lb

## 2014-06-17 DIAGNOSIS — Z9114 Patient's other noncompliance with medication regimen: Secondary | ICD-10-CM

## 2014-06-17 DIAGNOSIS — Z794 Long term (current) use of insulin: Secondary | ICD-10-CM

## 2014-06-17 DIAGNOSIS — I129 Hypertensive chronic kidney disease with stage 1 through stage 4 chronic kidney disease, or unspecified chronic kidney disease: Secondary | ICD-10-CM

## 2014-06-17 DIAGNOSIS — R1031 Right lower quadrant pain: Secondary | ICD-10-CM

## 2014-06-17 DIAGNOSIS — N183 Chronic kidney disease, stage 3 (moderate): Secondary | ICD-10-CM

## 2014-06-17 DIAGNOSIS — I10 Essential (primary) hypertension: Secondary | ICD-10-CM

## 2014-06-17 DIAGNOSIS — E1122 Type 2 diabetes mellitus with diabetic chronic kidney disease: Secondary | ICD-10-CM

## 2014-06-17 NOTE — Progress Notes (Signed)
   Subjective:    Patient ID: [name], female    DOB: Apr 25, 1955, 59 y.o.   MRN: [id number]  HPI Ms. Merendino is a 59yo woman with PMHx of HTN, Type 2 DM, CKD Stage 3, HLD, and GERD who presents today for an acute visit.   Patient was seen last week for RLQ abdominal pain with associated nausea and urinary frequency at that time. Her UA was consistent with UTI and was prescribed Cipro 500 mg daily for 5 days which she completed. Urine culture with multiple bacterial morphotypes.   Today patient notes she is still having RLQ abdominal pain. She states the pain is better than last week, but still bothering her. When asked the location of the pain she points more towards her right groin. She denies any pain with walking or lifting up her leg. She denies any fevers, chills, dysuria, hematuria, frequency, nausea, or vomiting.    Review of Systems General: Denies night sweats, changes in weight, changes in appetite HEENT: Denies headaches, ear pain, changes in vision, rhinorrhea, sore throat CV: Denies CP, palpitations, SOB, orthopnea Pulm: Denies SOB, cough, wheezing GI: Denies diarrhea, constipation, melena, hematochezia GU: See above  Msk: Denies muscle cramps, joint pains Neuro: Denies weakness, numbness, tingling Skin: Denies rashes, bruising    Objective:   Physical Exam General: middle aged woman sitting up in chair, appears anxious, pressured speech HEENT: [place]/AT, EOMI, sclera anicteric, mucus membranes moist CV: RRR, no m/g/r Pulm: CTA bilaterally, breaths non-labored Abd: BS+, soft, non-distended. Tenderness in right lower quadrant but predominantly in right groin. No suprapubic or CVA tenderness.  Ext: warm, no edema. No limitations in ROM upon leg raise, flexion or extension. Neuro: alert and oriented x 3, no focal deficits, strength intact in upper and lower extremities      Assessment & Plan:  Please refer to A&P documentation.

## 2014-06-17 NOTE — Patient Instructions (Signed)
It was a pleasure seeing you today, [name].  - STOP taking Metformin - Continue Lantus 22 units daily - When you get test strips, test twice daily for 1 week - Drink LOTS of fluids, 8 glasses per day - If you develop fever, increased abdominal pain, or blood in your urine please call the clinic - follow up in 2-3 weeks   General Instructions:   Please bring your medicines with you each time you come to clinic.  Medicines may include prescription medications, over-the-counter medications, herbal remedies, eye drops, vitamins, or other pills.   Progress Toward Treatment Goals:  Treatment Goal 06/10/2014  Hemoglobin A1C unchanged  Blood pressure at goal    Self Care Goals & Plans:  Self Care Goal 06/10/2014  Manage my medications take my medicines as prescribed; bring my medications to every visit; refill my medications on time  Monitor my health keep track of my blood glucose; bring my glucose meter and log to each visit; check my feet daily  Eat healthy foods eat more vegetables; eat foods that are low in salt; eat baked foods instead of fried foods  Be physically active find an activity I enjoy; take a walk every day  Meeting treatment goals -    Home Blood Glucose Monitoring 06/10/2014  Check my blood sugar 2 times a day  When to check my blood sugar before breakfast; before dinner     Care Management & Community Referrals:  Referral 06/06/2014  Referrals made for care management support financial counselor

## 2014-06-19 DIAGNOSIS — R1031 Right lower quadrant pain: Secondary | ICD-10-CM | POA: Insufficient documentation

## 2014-06-19 NOTE — Assessment & Plan Note (Signed)
Patient still having RLQ abdominal pain despite completing treatment with Cipro for a UTI last week. Her pain seems to be more in the right groin than RLQ of abdomen. Does not seem to be musculoskeletal as her ROM is not limited by pain on exam. Possibly could be a small kidney stone. Discussed getting a renal US with patient but she refused as she does not have insurance at this time and unable to afford it. Other possibility could be a gynecological problem.  - Recommended for patient to drink lots of fluids (at least eight 8 oz glasses daily) - Tylenol PRN pain - Recommended to follow up if pain does not resolve in next week

## 2014-06-19 NOTE — Assessment & Plan Note (Signed)
BP Readings from Last 3 Encounters:  06/17/14 128/65  06/10/14 135/71  06/06/14 148/84    Lab Results  Component Value Date   NA 139 06/09/2014   K 4.6 06/09/2014   CREATININE 1.85* 06/09/2014    Assessment: Blood pressure control: controlled Progress toward BP goal:  at goal Comments: BP well controlled today.   Plan: Medications: Continue Enalapril 10 mg daily

## 2014-06-19 NOTE — Assessment & Plan Note (Signed)
Lab Results  Component Value Date   HGBA1C 8.7 04/29/2014   HGBA1C 9.1 01/06/2014   HGBA1C 8.8 09/10/2013     Assessment: Diabetes control: fair control Progress toward A1C goal:  unchanged Comments: Patient ran out of test strips about 1 week ago. Denies any symptomatic lows. States she will probably be able to get test strips in 1 week. She reports taking Lantus 22 units daily. She states she did not start the Metformin.   Plan: Medications:  STOP metformin as patient noncompliant. Continue Lantus 22 units daily.  Home glucose monitoring: Frequency: 2 times a day Timing: before breakfast, before dinner Instruction/counseling given: reminded to bring blood glucose meter & log to each visit, reminded to bring medications to each visit and discussed foot care Other plans:  - Recommended for patient to test twice daily for 1 week once she gets her test strips - No changes in Lantus or addition of other oral meds made today because do not know how blood sugars are doing  - Very difficult patient to manage as she is noncompliant with her medications and is unable to afford most of her meds. Hopefully she will have orange card renewed soon.

## 2014-06-20 ENCOUNTER — Encounter (HOSPITAL_COMMUNITY): Payer: Self-pay | Admitting: Emergency Medicine

## 2014-06-20 ENCOUNTER — Emergency Department (HOSPITAL_COMMUNITY)
Admission: EM | Admit: 2014-06-20 | Discharge: 2014-06-20 | Disposition: A | Discharge status: Home / Self Care | Payer: Self-pay | Attending: Emergency Medicine | Admitting: Emergency Medicine

## 2014-06-20 DIAGNOSIS — Z8774 Personal history of (corrected) congenital malformations of heart and circulatory system: Secondary | ICD-10-CM | POA: Insufficient documentation

## 2014-06-20 DIAGNOSIS — R1031 Right lower quadrant pain: Secondary | ICD-10-CM

## 2014-06-20 DIAGNOSIS — N390 Urinary tract infection, site not specified: Secondary | ICD-10-CM | POA: Insufficient documentation

## 2014-06-20 DIAGNOSIS — N183 Chronic kidney disease, stage 3 (moderate): Secondary | ICD-10-CM | POA: Insufficient documentation

## 2014-06-20 DIAGNOSIS — Z79899 Other long term (current) drug therapy: Secondary | ICD-10-CM | POA: Insufficient documentation

## 2014-06-20 DIAGNOSIS — Z862 Personal history of diseases of the blood and blood-forming organs and certain disorders involving the immune mechanism: Secondary | ICD-10-CM | POA: Insufficient documentation

## 2014-06-20 DIAGNOSIS — Z8659 Personal history of other mental and behavioral disorders: Secondary | ICD-10-CM | POA: Insufficient documentation

## 2014-06-20 DIAGNOSIS — E785 Hyperlipidemia, unspecified: Secondary | ICD-10-CM | POA: Insufficient documentation

## 2014-06-20 DIAGNOSIS — Z7982 Long term (current) use of aspirin: Secondary | ICD-10-CM | POA: Insufficient documentation

## 2014-06-20 DIAGNOSIS — Z8742 Personal history of other diseases of the female genital tract: Secondary | ICD-10-CM | POA: Insufficient documentation

## 2014-06-20 DIAGNOSIS — E119 Type 2 diabetes mellitus without complications: Secondary | ICD-10-CM | POA: Insufficient documentation

## 2014-06-20 DIAGNOSIS — G8929 Other chronic pain: Secondary | ICD-10-CM | POA: Insufficient documentation

## 2014-06-20 DIAGNOSIS — I129 Hypertensive chronic kidney disease with stage 1 through stage 4 chronic kidney disease, or unspecified chronic kidney disease: Secondary | ICD-10-CM | POA: Insufficient documentation

## 2014-06-20 DIAGNOSIS — Z794 Long term (current) use of insulin: Secondary | ICD-10-CM | POA: Insufficient documentation

## 2014-06-20 LAB — URINALYSIS, ROUTINE W REFLEX MICROSCOPIC
Bilirubin Urine: NEGATIVE
GLUCOSE, UA: NEGATIVE mg/dL
HGB URINE DIPSTICK: NEGATIVE
KETONES UR: NEGATIVE mg/dL
Nitrite: NEGATIVE
Protein, ur: NEGATIVE mg/dL
Specific Gravity, Urine: 1.017 (ref 1.005–1.030)
UROBILINOGEN UA: 0.2 mg/dL (ref 0.0–1.0)
pH: 5 (ref 5.0–8.0)

## 2014-06-20 LAB — URINE MICROSCOPIC-ADD ON

## 2014-06-20 MED ORDER — CEPHALEXIN 500 MG PO CAPS
500.0000 mg | ORAL_CAPSULE | Freq: Once | ORAL | Status: AC
  Administered 2014-06-20: 500 mg via ORAL
  Filled 2014-06-20: qty 1

## 2014-06-20 MED ORDER — PHENAZOPYRIDINE HCL 100 MG PO TABS: 100.0000 mg | ORAL_TABLET | Freq: Three times a day (TID) | ORAL | Status: DC

## 2014-06-20 MED ORDER — PHENAZOPYRIDINE HCL 100 MG PO TABS
100.0000 mg | ORAL_TABLET | Freq: Three times a day (TID) | ORAL | Status: DC
  Administered 2014-06-20: 100 mg via ORAL
  Filled 2014-06-20: qty 1

## 2014-06-20 MED ORDER — PHENAZOPYRIDINE HCL 200 MG PO TABS: 200.0000 mg | ORAL_TABLET | Freq: Three times a day (TID) | ORAL | Status: DC

## 2014-06-20 MED ORDER — CEPHALEXIN 500 MG PO CAPS: 500.0000 mg | ORAL_CAPSULE | Freq: Two times a day (BID) | ORAL | Status: DC

## 2014-06-20 NOTE — Discharge Instructions (Signed)
Please finish the antibiotic course. Please see your doctor in 1 week.  Please return to the ER if your symptoms worsen; you have increased pain, fevers, chills, inability to keep any medications down, confusion. Otherwise see the outpatient doctor as requested.   Urinary Tract Infection Urinary tract infections (UTIs) can develop anywhere along your urinary tract. Your urinary tract is your body's drainage system for removing wastes and extra water. Your urinary tract includes two kidneys, two ureters, a bladder, and a urethra. Your kidneys are a pair of bean-shaped organs. Each kidney is about the size of your fist. They are located below your ribs, one on each side of your spine. CAUSES Infections are caused by microbes, which are microscopic organisms, including fungi, viruses, and bacteria. These organisms are so small that they can only be seen through a microscope. Bacteria are the microbes that most commonly cause UTIs. SYMPTOMS  Symptoms of UTIs may vary by age and gender of the patient and by the location of the infection. Symptoms in young women typically include a frequent and intense urge to urinate and a painful, burning feeling in the bladder or urethra during urination. Older women and men are more likely to be tired, shaky, and weak and have muscle aches and abdominal pain. A fever may mean the infection is in your kidneys. Other symptoms of a kidney infection include pain in your back or sides below the ribs, nausea, and vomiting. DIAGNOSIS To diagnose a UTI, your caregiver will ask you about your symptoms. Your caregiver also will ask to provide a urine sample. The urine sample will be tested for bacteria and white blood cells. White blood cells are made by your body to help fight infection. TREATMENT  Typically, UTIs can be treated with medication. Because most UTIs are caused by a bacterial infection, they usually can be treated with the use of antibiotics. The choice of  antibiotic and length of treatment depend on your symptoms and the type of bacteria causing your infection. HOME CARE INSTRUCTIONS  If you were prescribed antibiotics, take them exactly as your caregiver instructs you. Finish the medication even if you feel better after you have only taken some of the medication.  Drink enough water and fluids to keep your urine clear or pale yellow.  Avoid caffeine, tea, and carbonated beverages. They tend to irritate your bladder.  Empty your bladder often. Avoid holding urine for long periods of time.  Empty your bladder before and after sexual intercourse.  After a bowel movement, women should cleanse from front to back. Use each tissue only once. SEEK MEDICAL CARE IF:   You have back pain.  You develop a fever.  Your symptoms do not begin to resolve within 3 days. SEEK IMMEDIATE MEDICAL CARE IF:   You have severe back pain or lower abdominal pain.  You develop chills.  You have nausea or vomiting.  You have continued burning or discomfort with urination. MAKE SURE YOU:   Understand these instructions.  Will watch your condition.  Will get help right away if you are not doing well or get worse. Document Released: 11/03/2004 Document Revised: 07/26/2011 Document Reviewed: 03/04/2011 [facility] Patient Information 2015 [place], [place]. This information is not intended to replace advice given to you by your health care provider. Make sure you discuss any questions you have with your health care provider.

## 2014-06-20 NOTE — ED Notes (Signed)
Pt c/o right lower groin pain-was recently seen at [facility]. Takes Tylenol and says this alleviates the pain. Was told she had a UTI-took entire antibiotic regimen. Went to PCP Tuesday-was told it could possibly be a kidney stone. Denies problems urinating. Says pain is worse at night and when she "over exerts" herself. Denies N/V/D. Denies recent muscle strain. No other c/c.

## 2014-06-20 NOTE — ED Provider Notes (Signed)
CSN: [id number]     Arrival date & time 06/20/14  1721 History   First MD Initiated Contact with Patient 06/20/14 2117     Chief Complaint  Patient presents with  . Right groin pain      (Consider location/radiation/quality/duration/timing/severity/associated sxs/prior Treatment) HPI Comments: PT comes in with cc of right lower quadrant pain. Pt has hx of DM, ovarian cyst, CKD, IBS and chronic abd pain. Reports that she has been having persistent, intermittent pain on the right side of her groin - that kept her up last night. She started having pain again earlier today along with her arthritis pain, so she took tylenol and came to the ER - and overtime her pain has resolved. Pt has hx of frequent uti, she has no uti like sx currently. There is no nausea, emesis.  The history is provided by the patient.    Past Medical History  Diagnosis Date  . Diabetes mellitus type II, uncontrolled   . Hypertension   . Hyperlipidemia   . Ovarian cyst, left   . Anemia     due to menorrhagia, BL 8-10  . Vaginal cyst     nabothian and bartholin  . CKD (chronic kidney disease) stage 3, GFR 30-59 ml/min     baseline creatinine 1.4-1.7  . Anxiety   . Depression   . Postmenopausal bleeding 06/12/2008  . 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 UTI (lower urinary tract infection)   . IBS (irritable bowel syndrome)   . Chronic back pain   . Chronic abdominal pain   . DDD (degenerative disc disease), lumbar   . Bilateral renal cysts 01/15/2009    Qualifier: Diagnosis of  By: Tyrell Antonio MD, [place]    . Diabetes mellitus without complication   . History of palpitations     evaluated recently 12'15   Past Surgical History  Procedure Laterality Date  . Cardiac surgery      to repair congenital defect as a child- 47months old   Family History  Problem Relation Age of Onset  . Stroke Father   . Heart attack Father     Had MI in his 68s  . Stomach cancer Paternal Grandmother    . Diabetes Maternal Grandmother   . Cerebral palsy Daughter   . Anesthesia problems Neg Hx   . Hypotension Neg Hx   . Malignant hyperthermia Neg Hx   . Pseudochol deficiency Neg Hx    History  Substance Use Topics  . Smoking status: Never Smoker   . Smokeless tobacco: Never Used  . Alcohol Use: No   OB History    Gravida Para Term Preterm AB TAB SAB Ectopic Multiple Living   3 1 1  2  2   1      Review of Systems  Constitutional: Negative for activity change.  Respiratory: Negative for shortness of breath.   Cardiovascular: Negative for chest pain.  Gastrointestinal: Positive for abdominal pain. Negative for nausea and vomiting.  Genitourinary: Negative for dysuria.  Musculoskeletal: Positive for back pain. Negative for neck pain.  Neurological: Negative for headaches.      Allergies  Review of patient's allergies indicates no known allergies.  Home Medications   Prior to Admission medications   Medication Sig Start Date End Date Taking? Authorizing Provider  acetaminophen (TYLENOL) 325 MG tablet Take 2 tablets (650 mg total) by mouth every 6 (six) hours as needed for moderate pain. 04/29/14  Yes Marjan Rabbani,  MD  aspirin EC 81 MG tablet Take 81 mg by mouth every morning.    Yes Historical Provider, MD  enalapril (VASOTEC) 10 MG tablet Take 1 tablet (10 mg total) by mouth daily. 06/08/14  Yes Carly Montey Hora, MD  Insulin Glargine (LANTUS SOLOSTAR) 100 UNIT/ML Solostar Pen Inject 22 Units into the skin daily at 10 pm. 06/10/14  Yes Carly Montey Hora, MD  cephALEXin (KEFLEX) 500 MG capsule Take 1 capsule (500 mg total) by mouth 2 (two) times daily. 06/20/14   Varney Biles, MD  neomycin-polymyxin-hydrocortisone (CORTISPORIN) 3.5-10000-1 otic suspension Place 4 drops into the left ear 3 (three) times daily. Patient not taking: Reported on 06/06/2014 05/07/14   [name], MD  phenazopyridine (PYRIDIUM) 200 MG tablet Take 1 tablet (200 mg total) by mouth 3 (three) times daily. 06/20/14    Varney Biles, MD  simvastatin (ZOCOR) 40 MG tablet TAKE 1 TABLET (40 MG TOTAL) BY MOUTH AT BEDTIME. Patient not taking: Reported on 06/06/2014 11/26/13   [name], MD   BP 123/81 mmHg  Pulse 78  Temp(Src) 98 F (36.7 C) (Oral)  Resp 18  SpO2 96% Physical Exam  Constitutional: She is oriented to person, place, and time. She appears well-developed and well-nourished.  HENT:  Head: Normocephalic and atraumatic.  Eyes: EOM are normal. Pupils are equal, round, and reactive to light.  Neck: Neck supple.  Cardiovascular: Normal rate, regular rhythm and normal heart sounds.   No murmur heard. Pulmonary/Chest: Effort normal. No respiratory distress.  Abdominal: Soft. She exhibits no distension. There is no tenderness. There is no rebound and no guarding.  Musculoskeletal:  No midline L spine tenderness  Neurological: She is alert and oriented to person, place, and time.  Skin: Skin is warm and dry.  Nursing note and vitals reviewed.   ED Course  Procedures (including critical care time) Labs Review Labs Reviewed  URINALYSIS, ROUTINE W REFLEX MICROSCOPIC - Abnormal; Notable for the following:    APPearance CLOUDY (*)    Leukocytes, UA LARGE (*)    All other components within normal limits  URINE MICROSCOPIC-ADD ON - Abnormal; Notable for the following:    Bacteria, UA FEW (*)    All other components within normal limits  URINE CULTURE    Imaging Review No results found.   EKG Interpretation None      MDM   Final diagnoses:  Right lower quadrant pain  UTI (lower urinary tract infection)   Pt with hx of recurrent uti comes in with ongoing RLQ pain for the past several days. Pain is waxing and waning. She has some back pain - which is arthritic type and L sided Pt has normal vitals, she is pain free. DDx: uti, renal stones. Doubt appendicitis, pyelonephritis, or diverticulitis.  UA is + WBCs and few bacteria. Repeat exam done - and she has no abd  tenderness, and we still dont have clinical concerns for appendicitis, diverticulitis, intraabdominal abscess. Unlikely to be pelvic organ infection.  Will tx with keflex. Return precautions discussed, and pt in agreement with the plan.   Varney Biles, MD 06/20/14 2310

## 2014-06-22 LAB — URINE CULTURE
Colony Count: NO GROWTH
Culture: NO GROWTH

## 2014-07-02 ENCOUNTER — Inpatient Hospital Stay (HOSPITAL_COMMUNITY)
Admission: AD | Admit: 2014-07-02 | Discharge: 2014-07-02 | Disposition: A | Discharge status: Home / Self Care | Payer: Self-pay | Source: Ambulatory Visit | Attending: Obstetrics & Gynecology | Admitting: Obstetrics & Gynecology

## 2014-07-02 ENCOUNTER — Encounter (HOSPITAL_COMMUNITY): Payer: Self-pay | Admitting: *Deleted

## 2014-07-02 ENCOUNTER — Inpatient Hospital Stay (HOSPITAL_COMMUNITY): Payer: Self-pay

## 2014-07-02 DIAGNOSIS — E1122 Type 2 diabetes mellitus with diabetic chronic kidney disease: Secondary | ICD-10-CM | POA: Insufficient documentation

## 2014-07-02 DIAGNOSIS — R1031 Right lower quadrant pain: Secondary | ICD-10-CM | POA: Insufficient documentation

## 2014-07-02 DIAGNOSIS — Z823 Family history of stroke: Secondary | ICD-10-CM | POA: Insufficient documentation

## 2014-07-02 DIAGNOSIS — N183 Chronic kidney disease, stage 3 (moderate): Secondary | ICD-10-CM | POA: Insufficient documentation

## 2014-07-02 DIAGNOSIS — Z780 Asymptomatic menopausal state: Secondary | ICD-10-CM | POA: Insufficient documentation

## 2014-07-02 DIAGNOSIS — Z79899 Other long term (current) drug therapy: Secondary | ICD-10-CM | POA: Insufficient documentation

## 2014-07-02 DIAGNOSIS — E785 Hyperlipidemia, unspecified: Secondary | ICD-10-CM | POA: Insufficient documentation

## 2014-07-02 DIAGNOSIS — Z87440 Personal history of urinary (tract) infections: Secondary | ICD-10-CM | POA: Insufficient documentation

## 2014-07-02 DIAGNOSIS — N189 Chronic kidney disease, unspecified: Secondary | ICD-10-CM

## 2014-07-02 DIAGNOSIS — N8320 Unspecified ovarian cysts: Secondary | ICD-10-CM | POA: Insufficient documentation

## 2014-07-02 DIAGNOSIS — I129 Hypertensive chronic kidney disease with stage 1 through stage 4 chronic kidney disease, or unspecified chronic kidney disease: Secondary | ICD-10-CM | POA: Insufficient documentation

## 2014-07-02 DIAGNOSIS — K589 Irritable bowel syndrome without diarrhea: Secondary | ICD-10-CM | POA: Insufficient documentation

## 2014-07-02 DIAGNOSIS — N83202 Unspecified ovarian cyst, left side: Secondary | ICD-10-CM

## 2014-07-02 DIAGNOSIS — Z794 Long term (current) use of insulin: Secondary | ICD-10-CM | POA: Insufficient documentation

## 2014-07-02 DIAGNOSIS — Z8249 Family history of ischemic heart disease and other diseases of the circulatory system: Secondary | ICD-10-CM | POA: Insufficient documentation

## 2014-07-02 DIAGNOSIS — R109 Unspecified abdominal pain: Secondary | ICD-10-CM

## 2014-07-02 DIAGNOSIS — G8929 Other chronic pain: Secondary | ICD-10-CM | POA: Insufficient documentation

## 2014-07-02 LAB — POCT PREGNANCY, URINE: Preg Test, Ur: NEGATIVE

## 2014-07-02 LAB — COMPREHENSIVE METABOLIC PANEL
ALT: 19 U/L (ref 14–54)
ANION GAP: 7 (ref 5–15)
AST: 19 U/L (ref 15–41)
Albumin: 4.1 g/dL (ref 3.5–5.0)
Alkaline Phosphatase: 105 U/L (ref 38–126)
BUN: 42 mg/dL — ABNORMAL HIGH (ref 6–20)
CALCIUM: 9.2 mg/dL (ref 8.9–10.3)
CO2: 29 mmol/L (ref 22–32)
CREATININE: 1.48 mg/dL — AB (ref 0.44–1.00)
Chloride: 99 mmol/L — ABNORMAL LOW (ref 101–111)
GFR calc Af Amer: 44 mL/min — ABNORMAL LOW (ref >60)
GFR, EST NON AFRICAN AMERICAN: 38 mL/min — AB (ref >60)
Glucose, Bld: 209 mg/dL — ABNORMAL HIGH (ref 65–99)
POTASSIUM: 4.5 mmol/L (ref 3.5–5.1)
Sodium: 135 mmol/L (ref 135–145)
TOTAL PROTEIN: 6.8 g/dL (ref 6.5–8.1)
Total Bilirubin: 0.8 mg/dL (ref 0.3–1.2)

## 2014-07-02 LAB — URINALYSIS, ROUTINE W REFLEX MICROSCOPIC
BILIRUBIN URINE: NEGATIVE
Glucose, UA: NEGATIVE mg/dL
Hgb urine dipstick: NEGATIVE
KETONES UR: NEGATIVE mg/dL
Nitrite: NEGATIVE
PROTEIN: NEGATIVE mg/dL
Specific Gravity, Urine: 1.02 (ref 1.005–1.030)
UROBILINOGEN UA: 0.2 mg/dL (ref 0.0–1.0)
pH: 5.5 (ref 5.0–8.0)

## 2014-07-02 LAB — CBC
HCT: 34.5 % — ABNORMAL LOW (ref 36.0–46.0)
HEMOGLOBIN: 12.1 g/dL (ref 12.0–15.0)
MCH: 30.5 pg (ref 26.0–34.0)
MCHC: 35.1 g/dL (ref 30.0–36.0)
MCV: 86.9 fL (ref 78.0–100.0)
PLATELETS: 190 10*3/uL (ref 150–400)
RBC: 3.97 MIL/uL (ref 3.87–5.11)
RDW: 12.3 % (ref 11.5–15.5)
WBC: 7.7 10*3/uL (ref 4.0–10.5)

## 2014-07-02 LAB — URINE MICROSCOPIC-ADD ON

## 2014-07-02 NOTE — MAU Provider Note (Signed)
History     CSN: [id number]  Arrival date and time: 07/02/14 1643   First Provider Initiated Contact with Patient 07/02/14 1804      Chief Complaint  Patient presents with  . Abdominal Pain   HPI   [name] is a 59 y.o. female who presents with right lower quadrant pain. She has multiple chronic pain issues with history including uncontrolled DM II, HTN, HLD, left ovarian cyst, CKD stage 3, bilateral renal cysts, UTI, IBS, chronic abdominal pain. She presents to the Emergency Department complaining of RLQ pain. She was seen a week ago at [facility] with this same pain and was told she had a UTI. The pain is comes and goes; and worsens if she sits for a long period of time. The pain has been ongoing for 2-3 months. She has not mentioned this to her primary care provider. She has tried tylenol and this has given her much relief.   OB History    Gravida Para Term Preterm AB TAB SAB Ectopic Multiple Living   _0 Past Medical History  Diagnosis Date  . Diabetes mellitus type II, uncontrolled   . Hypertension   . Hyperlipidemia   . Ovarian cyst, left   . Anemia     due to menorrhagia, BL 8-10  . Vaginal cyst     nabothian and bartholin  . CKD (chronic kidney disease) stage 3, GFR 30-59 ml/min     baseline creatinine 1.4-1.7  . Anxiety   . Depression   . Postmenopausal bleeding 06/12/2008  . 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 UTI (lower urinary tract infection)   . IBS (irritable bowel syndrome)   . Chronic back pain   . Chronic abdominal pain   . DDD (degenerative disc disease), lumbar   . Bilateral renal cysts 01/15/2009    Qualifier: Diagnosis of  By: Tyrell Antonio MD, Bel[place]    . Diabetes mellitus without complication   . History of palpitations     evaluated recently 12'15    Past Surgical History  Procedure Laterality Date  . Cardiac surgery      to repair congenital defect as a child- 12month old    Family  History  Problem Relation Age of Onset  . Stroke Father   . Heart attack Father     Had MI in his 679s . Stomach cancer Paternal Grandmother   . Diabetes Maternal Grandmother   . Cerebral palsy Daughter   . Anesthesia problems Neg Hx   . Hypotension Neg Hx   . Malignant hyperthermia Neg Hx   . Pseudochol deficiency Neg Hx     History  Substance Use Topics  . Smoking status: Never Smoker   . Smokeless tobacco: Never Used  . Alcohol Use: No    Allergies: No Known Allergies  Prescriptions prior to admission  Medication Sig Dispense Refill Last Dose  . acetaminophen (TYLENOL) 325 MG tablet Take 2 tablets (650 mg total) by mouth every 6 (six) hours as needed for moderate pain.   07/02/2014 at Unknown time  . cephALEXin (KEFLEX) 500 MG capsule Take 1 capsule (500 mg total) by mouth 2 (two) times daily. 28 capsule 0 07/02/2014 at Unknown time  . enalapril (VASOTEC) 10 MG tablet Take 1 tablet (10 mg total) by mouth daily. 30 tablet 6 07/02/2014 at Unknown time  . Insulin Glargine (LANTUS  SOLOSTAR) 100 UNIT/ML Solostar Pen Inject 22 Units into the skin daily at 10 pm. 15 mL 11 07/01/2014 at Unknown time  . neomycin-polymyxin-hydrocortisone (CORTISPORIN) 3.5-10000-1 otic suspension Place 4 drops into the left ear 3 (three) times daily. (Patient not taking: Reported on 06/06/2014) 10 mL 0 Not Taking at Unknown time  . phenazopyridine (PYRIDIUM) 200 MG tablet Take 1 tablet (200 mg total) by mouth 3 (three) times daily. (Patient not taking: Reported on 07/02/2014) 6 tablet 0 Not Taking at Unknown time  . simvastatin (ZOCOR) 40 MG tablet TAKE 1 TABLET (40 MG TOTAL) BY MOUTH AT BEDTIME. (Patient not taking: Reported on 06/06/2014) 30 tablet 5 Not Taking at Unknown time   Results for orders placed or performed during the hospital encounter of 07/02/14 (from the past 48 hour(s))  Urinalysis, Routine w reflex microscopic (not at [facility])     Status: Abnormal   Collection Time: 07/02/14  4:56 PM  Result Value  Ref Range   Color, Urine YELLOW YELLOW   APPearance CLEAR CLEAR   Specific Gravity, Urine 1.020 1.005 - 1.030   pH 5.5 5.0 - 8.0   Glucose, UA NEGATIVE NEGATIVE mg/dL   Hgb urine dipstick NEGATIVE NEGATIVE   Bilirubin Urine NEGATIVE NEGATIVE   Ketones, ur NEGATIVE NEGATIVE mg/dL   Protein, ur NEGATIVE NEGATIVE mg/dL   Urobilinogen, UA 0.2 0.0 - 1.0 mg/dL   Nitrite NEGATIVE NEGATIVE   Leukocytes, UA MODERATE (A) NEGATIVE  Urine microscopic-add on     Status: Abnormal   Collection Time: 07/02/14  4:56 PM  Result Value Ref Range   Squamous Epithelial / LPF FEW (A) RARE   WBC, UA 7-10 <3 WBC/hpf  Pregnancy, urine POC     Status: None   Collection Time: 07/02/14  5:06 PM  Result Value Ref Range   Preg Test, Ur NEGATIVE NEGATIVE    Comment:        THE SENSITIVITY OF THIS METHODOLOGY IS >24 mIU/mL   CBC     Status: Abnormal   Collection Time: 07/02/14  6:50 PM  Result Value Ref Range   WBC 7.7 4.0 - 10.5 K/uL   RBC 3.97 3.87 - 5.11 MIL/uL   Hemoglobin 12.1 12.0 - 15.0 g/dL   HCT 34.5 (L) 36.0 - 46.0 %   MCV 86.9 78.0 - 100.0 fL   MCH 30.5 26.0 - 34.0 pg   MCHC 35.1 30.0 - 36.0 g/dL   RDW 12.3 11.5 - 15.5 %   Platelets 190 150 - 400 K/uL  Comprehensive metabolic panel     Status: Abnormal   Collection Time: 07/02/14  6:50 PM  Result Value Ref Range   Sodium 135 135 - 145 mmol/L   Potassium 4.5 3.5 - 5.1 mmol/L   Chloride 99 (L) 101 - 111 mmol/L   CO2 29 22 - 32 mmol/L   Glucose, Bld 209 (H) 65 - 99 mg/dL   BUN 42 (H) 6 - 20 mg/dL   Creatinine, Ser 1.48 (H) 0.44 - 1.00 mg/dL   Calcium 9.2 8.9 - 10.3 mg/dL   Total Protein 6.8 6.5 - 8.1 g/dL   Albumin 4.1 3.5 - 5.0 g/dL   AST 19 15 - 41 U/L   ALT 19 14 - 54 U/L   Alkaline Phosphatase 105 38 - 126 U/L   Total Bilirubin 0.8 0.3 - 1.2 mg/dL   GFR calc non Af Amer 38 (L) >60 mL/min   GFR calc Af Amer 44 (L) >60 mL/min    Comment: (  NOTE) The eGFR has been calculated using the CKD EPI equation. This calculation has not been  validated in all clinical situations. eGFR's persistently <60 mL/min signify possible Chronic Kidney Disease.    Anion gap 7 5 - 15    Review of Systems  Constitutional: Negative for fever and chills.  Gastrointestinal: Positive for abdominal pain. Negative for nausea, vomiting, diarrhea and constipation.   Physical Exam   Blood pressure 136/81, pulse 86, temperature 98.7 F (37.1 C), temperature source Oral, resp. rate 18.  Physical Exam  Constitutional: She is oriented to person, place, and time. She appears well-developed and well-nourished.  Non-toxic appearance. She does not have a sickly appearance. She does not appear ill. No distress.  HENT:  Head: Normocephalic.  Eyes: Pupils are equal, round, and reactive to light.  Neck: Neck supple.  Respiratory: Effort normal.  GI: Soft. She exhibits no distension and no mass. There is no tenderness. There is no rebound and no guarding.  Genitourinary:  Bimanual exam: Cervix closed, no CMT  Uterus non tender, normal size Adnexa non tender, no masses bilaterally Chaperone present for exam.  Musculoskeletal: Normal range of motion.  Neurological: She is alert and oriented to person, place, and time.  Skin: Skin is warm. She is not diaphoretic.   Results for orders placed or performed during the hospital encounter of 07/02/14 (from the past 24 hour(s))  Urinalysis, Routine w reflex microscopic (not at [facility])     Status: Abnormal   Collection Time: 07/02/14  4:56 PM  Result Value Ref Range   Color, Urine YELLOW YELLOW   APPearance CLEAR CLEAR   Specific Gravity, Urine 1.020 1.005 - 1.030   pH 5.5 5.0 - 8.0   Glucose, UA NEGATIVE NEGATIVE mg/dL   Hgb urine dipstick NEGATIVE NEGATIVE   Bilirubin Urine NEGATIVE NEGATIVE   Ketones, ur NEGATIVE NEGATIVE mg/dL   Protein, ur NEGATIVE NEGATIVE mg/dL   Urobilinogen, UA 0.2 0.0 - 1.0 mg/dL   Nitrite NEGATIVE NEGATIVE   Leukocytes, UA MODERATE (A) NEGATIVE  Urine microscopic-add on      Status: Abnormal   Collection Time: 07/02/14  4:56 PM  Result Value Ref Range   Squamous Epithelial / LPF FEW (A) RARE   WBC, UA 7-10 <3 WBC/hpf  Pregnancy, urine POC     Status: None   Collection Time: 07/02/14  5:06 PM  Result Value Ref Range   Preg Test, Ur NEGATIVE NEGATIVE  CBC     Status: Abnormal   Collection Time: 07/02/14  6:50 PM  Result Value Ref Range   WBC 7.7 4.0 - 10.5 K/uL   RBC 3.97 3.87 - 5.11 MIL/uL   Hemoglobin 12.1 12.0 - 15.0 g/dL   HCT 34.5 (L) 36.0 - 46.0 %   MCV 86.9 78.0 - 100.0 fL   MCH 30.5 26.0 - 34.0 pg   MCHC 35.1 30.0 - 36.0 g/dL   RDW 12.3 11.5 - 15.5 %   Platelets 190 150 - 400 K/uL  Comprehensive metabolic panel     Status: Abnormal   Collection Time: 07/02/14  6:50 PM  Result Value Ref Range   Sodium 135 135 - 145 mmol/L   Potassium 4.5 3.5 - 5.1 mmol/L   Chloride 99 (L) 101 - 111 mmol/L   CO2 29 22 - 32 mmol/L   Glucose, Bld 209 (H) 65 - 99 mg/dL   BUN 42 (H) 6 - 20 mg/dL   Creatinine, Ser 1.48 (H) 0.44 - 1.00 mg/dL   Calcium  9.2 8.9 - 10.3 mg/dL   Total Protein 6.8 6.5 - 8.1 g/dL   Albumin 4.1 3.5 - 5.0 g/dL   AST 19 15 - 41 U/L   ALT 19 14 - 54 U/L   Alkaline Phosphatase 105 38 - 126 U/L   Total Bilirubin 0.8 0.3 - 1.2 mg/dL   GFR calc non Af Amer 38 (L) >60 mL/min   GFR calc Af Amer 44 (L) >60 mL/min   Anion gap 7 5 - 15   US Transvaginal Non-ob  07/02/2014   CLINICAL DATA:  Right lower quadrant pain for 2-3 months.  EXAM: TRANSABDOMINAL AND TRANSVAGINAL ULTRASOUND OF PELVIS  DOPPLER ULTRASOUND OF OVARIES  TECHNIQUE: Both transabdominal and transvaginal ultrasound examinations of the pelvis were performed. Transabdominal technique was performed for global imaging of the pelvis including uterus, ovaries, adnexal regions, and pelvic cul-de-sac.  It was necessary to proceed with endovaginal exam following the transabdominal exam to visualize the endometrium and ovaries. Color and duplex Doppler ultrasound was utilized to evaluate blood  flow to the ovaries.  COMPARISON:  11/12/2013  FINDINGS: Uterus  Measurements: 6.8 x 4.6 x 6.9 cm. 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 Slight contour deformity along the anterior margin of the upper uterine segment may represent a 1.6 cm intramural fibroid.  Endometrium  Thickness: Approximately 6 mm, not well-visualized.  Right ovary  Measurements: 2.9 x 1.7 x 1.8 cm. Normal appearance/no adnexal mass.  Left ovary  Measurements: 6.2 x 2.0 x 2.8 cm. Suboptimally evaluated although best visualized transabdominally. 2 small cystic lesions measure 1.9 and 1.6 cm without definite solid component.  Pulsed Doppler evaluation of both ovaries demonstrates normal low-resistance arterial and venous waveforms.  Other findings  No free fluid.  IMPRESSION: 1. 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 Endometrium suboptimally visualized but is within normal limits for thickness in a postmenopausal female without abnormal uterine bleeding. 2. Two left ovarian cysts measuring up to 1.9 cm. Follow-up pelvic ultrasound recommended in 1 year. 3. Unremarkable appearance of the right ovary.   Electronically Signed   By: Logan Bores   On: 07/02/2014 20:35   US Pelvis Complete  07/02/2014   CLINICAL DATA:  Right lower quadrant pain for 2-3 months.  EXAM: TRANSABDOMINAL AND TRANSVAGINAL ULTRASOUND OF PELVIS  DOPPLER ULTRASOUND OF OVARIES  TECHNIQUE: Both transabdominal and transvaginal ultrasound examinations of the pelvis were performed. Transabdominal technique was performed for global imaging of the pelvis including uterus, ovaries, adnexal regions, and pelvic cul-de-sac.  It was necessary to proceed with endovaginal exam following the transabdominal exam to visualize the endometrium and ovaries. Color and duplex Doppler ultrasound was utilized to evaluate blood flow to the ovaries.  COMPARISON:  11/12/2013  FINDINGS: Uterus  Measurements: 6.8 x 4.6 x 6.9 cm. 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 Slight contour deformity along the  anterior margin of the upper uterine segment may represent a 1.6 cm intramural fibroid.  Endometrium  Thickness: Approximately 6 mm, not well-visualized.  Right ovary  Measurements: 2.9 x 1.7 x 1.8 cm. Normal appearance/no adnexal mass.  Left ovary  Measurements: 6.2 x 2.0 x 2.8 cm. Suboptimally evaluated although best visualized transabdominally. 2 small cystic lesions measure 1.9 and 1.6 cm without definite solid component.  Pulsed Doppler evaluation of both ovaries demonstrates normal low-resistance arterial and venous waveforms.  Other findings  No free fluid.  IMPRESSION: 1. 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 Endometrium suboptimally visualized but is within normal limits for thickness in a postmenopausal female without abnormal  uterine bleeding. 2. Two left ovarian cysts measuring up to 1.9 cm. Follow-up pelvic ultrasound recommended in 1 year. 3. Unremarkable appearance of the right ovary.   Electronically Signed   By: Logan Bores   On: 07/02/2014 20:35   [place] Art/ven Flow Abd Pelv Doppler  07/02/2014   CLINICAL DATA:  Right lower quadrant pain for 2-3 months.  EXAM: TRANSABDOMINAL AND TRANSVAGINAL ULTRASOUND OF PELVIS  DOPPLER ULTRASOUND OF OVARIES  TECHNIQUE: Both transabdominal and transvaginal ultrasound examinations of the pelvis were performed. Transabdominal technique was performed for global imaging of the pelvis including uterus, ovaries, adnexal regions, and pelvic cul-de-sac.  It was necessary to proceed with endovaginal exam following the transabdominal exam to visualize the endometrium and ovaries. Color and duplex Doppler ultrasound was utilized to evaluate blood flow to the ovaries.  COMPARISON:  11/12/2013  FINDINGS: Uterus  Measurements: 6.8 x 4.6 x 6.9 cm. 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 Slight contour deformity along the anterior margin of the upper uterine segment may represent a 1.6 cm intramural fibroid.  Endometrium  Thickness: Approximately 6 mm, not  well-visualized.  Right ovary  Measurements: 2.9 x 1.7 x 1.8 cm. Normal appearance/no adnexal mass.  Left ovary  Measurements: 6.2 x 2.0 x 2.8 cm. Suboptimally evaluated although best visualized transabdominally. 2 small cystic lesions measure 1.9 and 1.6 cm without definite solid component.  Pulsed Doppler evaluation of both ovaries demonstrates normal low-resistance arterial and venous waveforms.  Other findings  No free fluid.  IMPRESSION: 1. 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 Endometrium suboptimally visualized but is within normal limits for thickness in a postmenopausal female without abnormal uterine bleeding. 2. Two left ovarian cysts measuring up to 1.9 cm. Follow-up pelvic ultrasound recommended in 1 year. 3. Unremarkable appearance of the right ovary.   Electronically Signed   By: Logan Bores   On: 07/02/2014 20:35    MAU Course  Procedures  None  MDM Urine culture pending CBC CMP  Pelvic US with dopplers   Report given to [place] who resumes care of the patient; patient in [place]  Jennifer I Rasch, NP    Assessment and Plan   1. Chronic abdominal pain   2. Abdominal pain   3. Pain, abdominal, RLQ   4. Chronic kidney disease, unspecified stage   5. Cyst of left ovary    DC home FU with PCP Will need repeat pelvic US in one year to evaluated cyst, PCP can schedule this for you Return to MAU as needed Ibuprofen as needed for pain  Follow-up Information    Call [facility].   Specialty:  Internal Medicine   Why:  Will need repeat pelvic US in one year    Contact information:   [address] [place] [phone]

## 2014-07-02 NOTE — MAU Note (Signed)
Pt presents complaining of right lower quadrant pain that she has gone to [facility] for but states "they do not want to scan me or do anything for me." Pt states she figured she would come here today. Tylenol helps but does not make it go away. Denies vaginal bleeding or discharge.

## 2014-07-02 NOTE — Discharge Instructions (Signed)
Ovarian Cyst An ovarian cyst is a fluid-filled sac that forms on an ovary. The ovaries are small organs that produce eggs in women. Various types of cysts can form on the ovaries. Most are not cancerous. Many do not cause problems, and they often go away on their own. Some may cause symptoms and require treatment. Common types of ovarian cysts include:  Functional cysts--These cysts may occur every month during the menstrual cycle. This is normal. The cysts usually go away with the next menstrual cycle if the woman does not get pregnant. Usually, there are no symptoms with a functional cyst.  Endometrioma cysts--These cysts form from the tissue that lines the uterus. They are also called "chocolate cysts" because they become filled with blood that turns brown. This type of cyst can cause pain in the lower abdomen during intercourse and with your menstrual period.  Cystadenoma cysts--This type develops from the cells on the outside of the ovary. These cysts can get very big and cause lower abdomen pain and pain with intercourse. This type of cyst can twist on itself, cut off its blood supply, and cause severe pain. It can also easily rupture and cause a lot of pain.  Dermoid cysts--This type of cyst is sometimes found in both ovaries. These cysts may contain different kinds of body tissue, such as skin, teeth, hair, or cartilage. They usually do not cause symptoms unless they get very big.  Theca lutein cysts--These cysts occur when too much of a certain hormone (human chorionic gonadotropin) is produced and overstimulates the ovaries to produce an egg. This is most common after procedures used to assist with the conception of a baby (in vitro fertilization). CAUSES   Fertility drugs can cause a condition in which multiple large cysts are formed on the ovaries. This is called ovarian hyperstimulation syndrome.  A condition called polycystic ovary syndrome can cause hormonal imbalances that can lead to  nonfunctional ovarian cysts. SIGNS AND SYMPTOMS  Many ovarian cysts do not cause symptoms. If symptoms are present, they may include:  Pelvic pain or pressure.  Pain in the lower abdomen.  Pain during sexual intercourse.  Increasing girth (swelling) of the abdomen.  Abnormal menstrual periods.  Increasing pain with menstrual periods.  Stopping having menstrual periods without being pregnant. DIAGNOSIS  These cysts are commonly found during a routine or annual pelvic exam. Tests may be ordered to find out more about the cyst. These tests may include:  Ultrasound.  X-ray of the pelvis.  CT scan.  MRI.  Blood tests. TREATMENT  Many ovarian cysts go away on their own without treatment. Your health care provider may want to check your cyst regularly for 2-3 months to see if it changes. For women in menopause, it is particularly important to monitor a cyst closely because of the higher rate of ovarian cancer in menopausal women. When treatment is needed, it may include any of the following:  A procedure to drain the cyst (aspiration). This may be done using a long needle and ultrasound. It can also be done through a laparoscopic procedure. This involves using a thin, lighted tube with a tiny camera on the end (laparoscope) inserted through a small incision.  Surgery to remove the whole cyst. This may be done using laparoscopic surgery or an open surgery involving a larger incision in the lower abdomen.  Hormone treatment or birth control pills. These methods are sometimes used to help dissolve a cyst. HOME CARE INSTRUCTIONS   Only take over-the-counter   or prescription medicines as directed by your health care provider.  Follow up with your health care provider as directed.  Get regular pelvic exams and Pap tests. SEEK MEDICAL CARE IF:   Your periods are late, irregular, or painful, or they stop.  Your pelvic pain or abdominal pain does not go away.  Your abdomen becomes  larger or swollen.  You have pressure on your bladder or trouble emptying your bladder completely.  You have pain during sexual intercourse.  You have feelings of fullness, pressure, or discomfort in your stomach.  You lose weight for no apparent reason.  You feel generally ill.  You become constipated.  You lose your appetite.  You develop acne.  You have an increase in body and facial hair.  You are gaining weight, without changing your exercise and eating habits.  You think you are pregnant. SEEK IMMEDIATE MEDICAL CARE IF:   You have increasing abdominal pain.  You feel sick to your stomach (nauseous), and you throw up (vomit).  You develop a fever that comes on suddenly.  You have abdominal pain during a bowel movement.  Your menstrual periods become heavier than usual. MAKE SURE YOU:  Understand these instructions.  Will watch your condition.  Will get help right away if you are not doing well or get worse. Document Released: 01/24/2005 Document Revised: 01/29/2013 Document Reviewed: 10/01/2012 [employer] Patient Information 2015 [employer]. This information is not intended to replace advice given to you by your health care provider. Make sure you discuss any questions you have with your health care provider.  

## 2014-07-02 NOTE — Progress Notes (Signed)
U/S paged. They will call back after s/c

## 2014-07-06 ENCOUNTER — Emergency Department (HOSPITAL_COMMUNITY)
Admission: EM | Admit: 2014-07-06 | Discharge: 2014-07-07 | Discharge status: Against Medical Advice | Payer: Self-pay | Attending: Emergency Medicine | Admitting: Emergency Medicine

## 2014-07-06 ENCOUNTER — Encounter (HOSPITAL_COMMUNITY): Payer: Self-pay

## 2014-07-06 DIAGNOSIS — R1031 Right lower quadrant pain: Secondary | ICD-10-CM | POA: Insufficient documentation

## 2014-07-06 DIAGNOSIS — G8929 Other chronic pain: Secondary | ICD-10-CM | POA: Insufficient documentation

## 2014-07-06 DIAGNOSIS — N183 Chronic kidney disease, stage 3 (moderate): Secondary | ICD-10-CM | POA: Insufficient documentation

## 2014-07-06 DIAGNOSIS — I129 Hypertensive chronic kidney disease with stage 1 through stage 4 chronic kidney disease, or unspecified chronic kidney disease: Secondary | ICD-10-CM | POA: Insufficient documentation

## 2014-07-06 DIAGNOSIS — E119 Type 2 diabetes mellitus without complications: Secondary | ICD-10-CM | POA: Insufficient documentation

## 2014-07-06 LAB — CBC WITH DIFFERENTIAL/PLATELET
BASOS PCT: 0 % (ref 0–1)
Basophils Absolute: 0 10*3/uL (ref 0.0–0.1)
Eosinophils Absolute: 0.1 10*3/uL (ref 0.0–0.7)
Eosinophils Relative: 1 % (ref 0–5)
HEMATOCRIT: 36.9 % (ref 36.0–46.0)
HEMOGLOBIN: 12.5 g/dL (ref 12.0–15.0)
Lymphocytes Relative: 33 % (ref 12–46)
Lymphs Abs: 2.6 10*3/uL (ref 0.7–4.0)
MCH: 29.8 pg (ref 26.0–34.0)
MCHC: 33.9 g/dL (ref 30.0–36.0)
MCV: 87.9 fL (ref 78.0–100.0)
Monocytes Absolute: 0.4 10*3/uL (ref 0.1–1.0)
Monocytes Relative: 5 % (ref 3–12)
NEUTROS PCT: 61 % (ref 43–77)
Neutro Abs: 4.9 10*3/uL (ref 1.7–7.7)
PLATELETS: 196 10*3/uL (ref 150–400)
RBC: 4.2 MIL/uL (ref 3.87–5.11)
RDW: 12.1 % (ref 11.5–15.5)
WBC: 8 10*3/uL (ref 4.0–10.5)

## 2014-07-06 LAB — COMPREHENSIVE METABOLIC PANEL
ALBUMIN: 4.1 g/dL (ref 3.5–5.0)
ALT: 29 U/L (ref 14–54)
ANION GAP: 9 (ref 5–15)
AST: 30 U/L (ref 15–41)
Alkaline Phosphatase: 109 U/L (ref 38–126)
BUN: 47 mg/dL — ABNORMAL HIGH (ref 6–20)
CALCIUM: 9 mg/dL (ref 8.9–10.3)
CO2: 28 mmol/L (ref 22–32)
Chloride: 96 mmol/L — ABNORMAL LOW (ref 101–111)
Creatinine, Ser: 1.53 mg/dL — ABNORMAL HIGH (ref 0.44–1.00)
GFR calc Af Amer: 43 mL/min — ABNORMAL LOW (ref >60)
GFR calc non Af Amer: 37 mL/min — ABNORMAL LOW (ref >60)
Glucose, Bld: 390 mg/dL — ABNORMAL HIGH (ref 65–99)
Potassium: 5.3 mmol/L — ABNORMAL HIGH (ref 3.5–5.1)
Sodium: 133 mmol/L — ABNORMAL LOW (ref 135–145)
TOTAL PROTEIN: 7.2 g/dL (ref 6.5–8.1)
Total Bilirubin: 0.3 mg/dL (ref 0.3–1.2)

## 2014-07-06 NOTE — ED Notes (Signed)
Pt not in lobby when called back to acute room. Will recheck.

## 2014-07-06 NOTE — ED Notes (Signed)
Patient c/o right lower abdominal pain x 3 weeks. Patient was seen at [facility] 4 days ago and was told she had a left ovarian cyst.

## 2014-07-11 ENCOUNTER — Encounter: Payer: Self-pay | Admitting: Pulmonary Disease

## 2014-07-11 ENCOUNTER — Ambulatory Visit (INDEPENDENT_AMBULATORY_CARE_PROVIDER_SITE_OTHER): Payer: Self-pay | Admitting: Pulmonary Disease

## 2014-07-11 VITALS — BP 110/55 | HR 75 | Temp 98.5°F | Ht 64.0 in | Wt 168.8 lb

## 2014-07-11 DIAGNOSIS — B9689 Other specified bacterial agents as the cause of diseases classified elsewhere: Secondary | ICD-10-CM

## 2014-07-11 DIAGNOSIS — N183 Chronic kidney disease, stage 3 unspecified: Secondary | ICD-10-CM

## 2014-07-11 DIAGNOSIS — N390 Urinary tract infection, site not specified: Secondary | ICD-10-CM

## 2014-07-11 DIAGNOSIS — E1122 Type 2 diabetes mellitus with diabetic chronic kidney disease: Secondary | ICD-10-CM

## 2014-07-11 DIAGNOSIS — Z794 Long term (current) use of insulin: Secondary | ICD-10-CM

## 2014-07-11 DIAGNOSIS — R1031 Right lower quadrant pain: Secondary | ICD-10-CM

## 2014-07-11 DIAGNOSIS — I129 Hypertensive chronic kidney disease with stage 1 through stage 4 chronic kidney disease, or unspecified chronic kidney disease: Secondary | ICD-10-CM

## 2014-07-11 DIAGNOSIS — I10 Essential (primary) hypertension: Secondary | ICD-10-CM

## 2014-07-11 DIAGNOSIS — N3000 Acute cystitis without hematuria: Secondary | ICD-10-CM

## 2014-07-11 DIAGNOSIS — E875 Hyperkalemia: Secondary | ICD-10-CM

## 2014-07-11 LAB — BASIC METABOLIC PANEL
Anion gap: 10 (ref 5–15)
BUN: 35 mg/dL — AB (ref 6–20)
CO2: 26 mmol/L (ref 22–32)
Calcium: 9.3 mg/dL (ref 8.9–10.3)
Chloride: 98 mmol/L — ABNORMAL LOW (ref 101–111)
Creatinine, Ser: 1.54 mg/dL — ABNORMAL HIGH (ref 0.44–1.00)
GFR calc Af Amer: 42 mL/min — ABNORMAL LOW (ref >60)
GFR, EST NON AFRICAN AMERICAN: 36 mL/min — AB (ref >60)
Glucose, Bld: 234 mg/dL — ABNORMAL HIGH (ref 65–99)
POTASSIUM: 4.6 mmol/L (ref 3.5–5.1)
SODIUM: 134 mmol/L — AB (ref 135–145)

## 2014-07-11 LAB — GLUCOSE, CAPILLARY: Glucose-Capillary: 224 mg/dL — ABNORMAL HIGH (ref 65–99)

## 2014-07-11 MED ORDER — PIOGLITAZONE HCL 15 MG PO TABS: 15.0000 mg | ORAL_TABLET | Freq: Every day | ORAL | Status: DC

## 2014-07-11 NOTE — Progress Notes (Signed)
Internal Medicine Clinic Attending  Case discussed with Dr. Krall at the time of the visit.  We reviewed the resident's history and exam and pertinent patient test results.  I agree with the assessment, diagnosis, and plan of care documented in the resident's note.  

## 2014-07-11 NOTE — Assessment & Plan Note (Signed)
BP Readings from Last 3 Encounters:  07/11/14 110/55  07/06/14 118/74  07/02/14 128/76    Lab Results  Component Value Date   NA 134* 07/11/2014   K 4.6 07/11/2014   CREATININE 1.54* 07/11/2014    Assessment: Blood pressure control: controlled Progress toward BP goal:  at goal  Plan: Medications:  Continue enalapril 10 mg daily

## 2014-07-11 NOTE — Assessment & Plan Note (Signed)
BMP checked today with creatinine 1.54 stable from 07/06/2014 with creatinine 1.53. Baseline is around 1.5.

## 2014-07-11 NOTE — Assessment & Plan Note (Signed)
Lab Results  Component Value Date   HGBA1C 8.7 04/29/2014   HGBA1C 9.1 01/06/2014   HGBA1C 8.8 09/10/2013     Assessment: Diabetes control: fair control Progress toward A1C goal:  unchanged Comments: Denies episodes of symptomatic hypoglycemia. Actos previously discontinued as it was thought to have been contributing to hypoglycemia. She continued to have hypoglycemia after it was discontinued.  Plan: Medications:  Continue Lantus 22 units daily. Restart Actos 15 mg daily. Instruction/counseling given: reminded to bring blood glucose meter & log to each visit Other plans:  -Follow up in 2 weeks with glucose meter

## 2014-07-11 NOTE — Progress Notes (Signed)
Subjective:    Patient ID: [name], female    DOB: [birth year], 57 y.o.   MRN: [id number]  HPI [name] is a 59 year old woman with history of diabetes type 2, hypertension, hyperlipidemia, CKD stage III, left ovarian cyst, anxiety/depression presenting for follow-up.  She was seen by ED at [facility] on 07/02/2014 for right lower quadrant abdominal/groin pain. 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 She will need repeat pelvic ultrasound in one year to evaluate her left ovarian cyst.  She was seen at [facility] ED on 06/20/2014 for the same pain. Urinalysis was positive for WBCs and few bacteria. She was prescribed Keflex 500 mg twice a day for 14 days. Urine culture with no growth.  She was last seen in clinic 06/17/2014. At that time pain was thought to be possibly a small kidney stone. She refused renal ultrasound. She was recommended to drink lots of fluids and Tylenol as needed.  She reports her right groin pain has improved and is controlled on Tylenol as needed. She denies fevers/chills, nausea/vomiting, constipation, diarrhea, dysuria, hematuria.  For her diabetes, she has been noncompliant on metformin and this was discontinued. She was to continue Lantus 22 units daily. She did not bring her meter today. Denies episodes of symptomatic hypoglycemia.  Review of Systems Constitutional: no fevers/chills Eyes: no vision changes Ears, nose, mouth, throat, and face: no cough Respiratory: no shortness of breath Cardiovascular: no chest pain Gastrointestinal: no nausea/vomiting, no abdominal pain, no constipation, no diarrhea Genitourinary: no dysuria, no hematuria Integument: no rash Hematologic/lymphatic: no bleeding/bruising, no edema Musculoskeletal: no arthralgias, no myalgias Neurological: no paresthesias, no weakness  Past Medical History    Diagnosis Date  . Diabetes mellitus type II, uncontrolled   . Hypertension   . Hyperlipidemia   . Ovarian cyst, left   . Anemia     due to menorrhagia, BL 8-10  . Vaginal cyst     nabothian and bartholin  . CKD (chronic kidney disease) stage 3, GFR 30-59 ml/min     baseline creatinine 1.4-1.7  . Anxiety   . Depression   . Postmenopausal bleeding 06/12/2008  . 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 UTI (lower urinary tract infection)   . IBS (irritable bowel syndrome)   . Chronic back pain   . Chronic abdominal pain   . DDD (degenerative disc disease), lumbar   . Bilateral renal cysts 01/15/2009    Qualifier: Diagnosis of  By: Tyrell Antonio MD, Belk[place]    . Diabetes mellitus without complication   . History of palpitations     evaluated recently 12'15    Current Outpatient Prescriptions on File Prior to Visit  Medication Sig Dispense Refill  . acetaminophen (TYLENOL) 325 MG tablet Take 2 tablets (650 mg total) by mouth every 6 (six) hours as needed for moderate pain.    . cephALEXin (KEFLEX) 500 MG capsule Take 1 capsule (500 mg total) by mouth 2 (two) times daily. 28 capsule 0  . enalapril (VASOTEC) 10 MG tablet Take 1 tablet (10 mg total) by mouth daily. 30 tablet 6  . Insulin Glargine (LANTUS SOLOSTAR) 100 UNIT/ML Solostar Pen Inject 22 Units into the skin daily at 10 pm. 15 mL 11  . neomycin-polymyxin-hydrocortisone (CORTISPORIN) 3.5-10000-1 otic suspension Place 4 drops into the left ear 3 (three) times daily. (Patient not taking: Reported on  06/06/2014) 10 mL 0  . phenazopyridine (PYRIDIUM) 200 MG tablet Take 1 tablet (200 mg total) by mouth 3 (three) times daily. (Patient not taking: Reported on 07/02/2014) 6 tablet 0  . simvastatin (ZOCOR) 40 MG tablet TAKE 1 TABLET (40 MG TOTAL) BY MOUTH AT BEDTIME. (Patient not taking: Reported on 06/06/2014) 30 tablet 5  . [DISCONTINUED] pantoprazole (PROTONIX) 20 MG tablet Take 2 tablets (40 mg total) by mouth daily. 30  tablet 1  . [DISCONTINUED] sertraline (ZOLOFT) 100 MG tablet Take 1 tablet (100 mg total) by mouth daily. 30 tablet 2   No current facility-administered medications on file prior to visit.    Today's Vitals   07/11/14 1043 07/11/14 1127  BP: 127/100 110/55  Pulse: 85 75  Temp: 98.5 F (36.9 C)   TempSrc: Oral   Height: 5\' 4"  (1.626 m)   Weight: 168 lb 12.8 oz (76.567 kg)   SpO2: 100%   PainSc: 0-[name]     Objective:  Physical Exam  Constitutional: She is oriented to person, place, and time. She appears well-developed and well-nourished.  HENT:  Head: Normocephalic and atraumatic.  Eyes: Conjunctivae are normal.  Neck: Neck supple.  Cardiovascular: Normal rate and regular rhythm.   Pulmonary/Chest: Effort normal and breath sounds normal.  Abdominal: Soft. She exhibits no distension. There is no tenderness.  Musculoskeletal: Normal range of motion.  Neurological: She is alert and oriented to person, place, and time.  Skin: Skin is warm and dry.  Psychiatric: She has a normal mood and affect.    Assessment & Plan:  Please refer to problem based charting.

## 2014-07-11 NOTE — Assessment & Plan Note (Addendum)
She was seen at [facility] ED on 06/20/2014 for right groin pain. Urinalysis was positive for WBCs and few bacteria. She was prescribed Keflex 500 mg twice a day for 14 days. Urine culture with no growth. She has completed her Keflex course. Denies dysuria or hematuria.

## 2014-07-11 NOTE — Patient Instructions (Signed)
General Instructions:   Please bring your medicines with you each time you come to clinic.  Medicines may include prescription medications, over-the-counter medications, herbal remedies, eye drops, vitamins, or other pills.   Progress Toward Treatment Goals:  Treatment Goal 07/11/2014  Hemoglobin A1C unchanged  Blood pressure at goal    Self Care Goals & Plans:  Self Care Goal 07/11/2014  Manage my medications take my medicines as prescribed; bring my medications to every visit; refill my medications on time  Monitor my health keep track of my blood glucose; bring my glucose meter and log to each visit; check my feet daily  Eat healthy foods drink diet soda or water instead of juice or soda; eat more vegetables; eat foods that are low in salt; eat baked foods instead of fried foods  Be physically active take a walk every day  Meeting treatment goals -    Home Blood Glucose Monitoring 06/17/2014  Check my blood sugar 2 times a day  When to check my blood sugar before breakfast; before dinner

## 2014-07-11 NOTE — Assessment & Plan Note (Signed)
Right lower quadrant abdominal/groin pain has improved and is controlled on Tylenol as needed.  Plan: -She reports she has an appointment with GI next week. Encouraged her to keep this appointment. -Continue to monitor.

## 2014-07-11 NOTE — Assessment & Plan Note (Addendum)
Potassium on 07/06/2014 was 5.3. Recheck today 4.6.  Sodium was 133 on 07/06/2014 with glucose 390. This corrects to sodium of 138.

## 2014-07-13 NOTE — Progress Notes (Signed)
INTERNAL MEDICINE TEACHING ATTENDING ADDENDUM - Yaretsi Humphres, MD: I reviewed and discussed at the time of visit with the resident Dr. Rivet, the patient's medical history, physical examination, diagnosis and results of pertinent tests and treatment and I agree with the patient's care as documented.  

## 2014-07-23 ENCOUNTER — Other Ambulatory Visit: Payer: Self-pay | Admitting: Gastroenterology

## 2014-07-23 NOTE — Addendum Note (Signed)
Addended by: Arta Silence on: 07/23/2014 10:27 AM   Modules accepted: Orders

## 2014-07-28 ENCOUNTER — Encounter (HOSPITAL_COMMUNITY): Payer: Self-pay | Admitting: Emergency Medicine

## 2014-07-28 ENCOUNTER — Emergency Department (INDEPENDENT_AMBULATORY_CARE_PROVIDER_SITE_OTHER)
Admission: EM | Admit: 2014-07-28 | Discharge: 2014-07-28 | Disposition: A | Discharge status: Home / Self Care | Payer: No Typology Code available for payment source | Source: Home / Self Care | Attending: Family Medicine | Admitting: Family Medicine

## 2014-07-28 DIAGNOSIS — S161XXA Strain of muscle, fascia and tendon at neck level, initial encounter: Secondary | ICD-10-CM

## 2014-07-28 MED ORDER — TRAMADOL HCL 50 MG PO TABS: 50.0000 mg | ORAL_TABLET | Freq: Four times a day (QID) | ORAL | Status: DC | PRN

## 2014-07-28 MED ORDER — CYCLOBENZAPRINE HCL 5 MG PO TABS: 5.0000 mg | ORAL_TABLET | Freq: Every evening | ORAL | Status: DC | PRN

## 2014-07-28 NOTE — Progress Notes (Signed)
07-28-14 90 Spoke with patient she has cancelled procedure-called office last week to cancel.

## 2014-07-28 NOTE — ED Provider Notes (Signed)
[name] is a 59 y.o. female who presents to Urgent Care today for right shoulder pain. Patient has pain in the right trapezius present for 2 days. She denies any injury. No radiating pain weakness or numbness. She's tried Tylenol which helps. She feels well otherwise. No fevers or chills nausea vomiting or diarrhea.   Past Medical History  Diagnosis Date  . Diabetes mellitus type II, uncontrolled   . Hypertension   . Hyperlipidemia   . Ovarian cyst, left   . Anemia     due to menorrhagia, BL 8-10  . Vaginal cyst     nabothian and bartholin  . CKD (chronic kidney disease) stage 3, GFR 30-59 ml/min     baseline creatinine 1.4-1.7  . Anxiety   . Depression   . Postmenopausal bleeding 06/12/2008  . 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 UTI (lower urinary tract infection)   . IBS (irritable bowel syndrome)   . Chronic back pain   . Chronic abdominal pain   . DDD (degenerative disc disease), lumbar   . Bilateral renal cysts 01/15/2009    Qualifier: Diagnosis of  By: Tyrell Antonio MD, Belk[place]    . Diabetes mellitus without complication   . History of palpitations     evaluated recently 12'15   Past Surgical History  Procedure Laterality Date  . Cardiac surgery      to repair congenital defect as a child- 38months old   History  Substance Use Topics  . Smoking status: Never Smoker   . Smokeless tobacco: Never Used  . Alcohol Use: No   ROS as above Medications: No current facility-administered medications for this encounter.   Current Outpatient Prescriptions  Medication Sig Dispense Refill  . acetaminophen (TYLENOL) 325 MG tablet Take 2 tablets (650 mg total) by mouth every 6 (six) hours as needed for moderate pain.    Marland Kitchen enalapril (VASOTEC) 10 MG tablet Take 1 tablet (10 mg total) by mouth daily. 30 tablet 6  . Insulin Glargine (LANTUS SOLOSTAR) 100 UNIT/ML Solostar Pen Inject 22 Units into the skin daily at 10 pm. 15 mL 11  . pioglitazone (ACTOS) 15 MG  tablet Take 1 tablet (15 mg total) by mouth daily. 30 tablet 0  . simvastatin (ZOCOR) 40 MG tablet TAKE 1 TABLET (40 MG TOTAL) BY MOUTH AT BEDTIME. 30 tablet 5  . cyclobenzaprine (FLEXERIL) 5 MG tablet Take 1 tablet (5 mg total) by mouth at bedtime as needed for muscle spasms. 20 tablet 0  . traMADol (ULTRAM) 50 MG tablet Take 1 tablet (50 mg total) by mouth every 6 (six) hours as needed. 10 tablet 0  . [DISCONTINUED] pantoprazole (PROTONIX) 20 MG tablet Take 2 tablets (40 mg total) by mouth daily. 30 tablet 1  . [DISCONTINUED] sertraline (ZOLOFT) 100 MG tablet Take 1 tablet (100 mg total) by mouth daily. 30 tablet 2   No Known Allergies   Exam:  BP 141/87 mmHg  Pulse 79  Temp(Src) 97.5 F (36.4 C) (Oral)  Resp 16  SpO2 98% Gen: Well NAD HEENT: EOMI,  MMM Lungs: Normal work of breathing. CTABL Heart: RRR no MRG Abd: NABS, Soft. Nondistended, Nontender Exts: Brisk capillary refill, warm and well perfused.  Neck: Nontender to midline. Tender palpation right trapezius. Normal neck range of motion negative Spurling's test. Upper extremity strength and reflexes are equal and normal throughout. Shoulders are nontender bilaterally with full and normal shoulder motion and negative impingement bilaterally. Sensation intact throughout  upper extremities  No results found for this or any previous visit (from the past 24 hour(s)). No results found.  Assessment and Plan: 59 y.o. female with trapezius strain. Treat with Flexeril and tramadol. Return as needed. Avoid NSAIDs due to CKD stage III.   Discussed warning signs or symptoms. Please see discharge instructions. Patient expresses understanding.     [name], MD 07/28/14 Lurena Nida

## 2014-07-28 NOTE — Discharge Instructions (Signed)
Thank you for coming in today. Come back or go to the emergency room if you notice new weakness new numbness problems walking or bowel or bladder problems.   Cervical Strain and Sprain (Whiplash) with Rehab Cervical strain and sprain are injuries that commonly occur with "whiplash" injuries. Whiplash occurs when the neck is forcefully whipped backward or forward, such as during a motor vehicle accident or during contact sports. The muscles, ligaments, tendons, discs, and nerves of the neck are susceptible to injury when this occurs. RISK FACTORS Risk of having a whiplash injury increases if:  Osteoarthritis of the spine.  Situations that make head or neck accidents or trauma more likely.  High-risk sports (football, rugby, wrestling, hockey, auto racing, gymnastics, diving, contact karate, or boxing).  Poor strength and flexibility of the neck.  Previous neck injury.  Poor tackling technique.  Improperly fitted or padded equipment. SYMPTOMS   Pain or stiffness in the front or back of neck or both.  Symptoms may present immediately or up to 24 hours after injury.  Dizziness, headache, nausea, and vomiting.  Muscle spasm with soreness and stiffness in the neck.  Tenderness and swelling at the injury site. PREVENTION  Learn and use proper technique (avoid tackling with the head, spearing, and head-butting; use proper falling techniques to avoid landing on the head).  Warm up and stretch properly before activity.  Maintain physical fitness:  Strength, flexibility, and endurance.  Cardiovascular fitness.  Wear properly fitted and padded protective equipment, such as padded soft collars, for participation in contact sports. PROGNOSIS  Recovery from cervical strain and sprain injuries is dependent on the extent of the injury. These injuries are usually curable in 1 week to 3 months with appropriate treatment.  RELATED COMPLICATIONS   Temporary numbness and weakness may  occur if the nerve roots are damaged, and this may persist until the nerve has completely healed.  Chronic pain due to frequent recurrence of symptoms.  Prolonged healing, especially if activity is resumed too soon (before complete recovery). TREATMENT  Treatment initially involves the use of ice and medication to help reduce pain and inflammation. It is also important to perform strengthening and stretching exercises and modify activities that worsen symptoms so the injury does not get worse. These exercises may be performed at home or with a therapist. For patients who experience severe symptoms, a soft, padded collar may be recommended to be worn around the neck.  Improving your posture may help reduce symptoms. Posture improvement includes pulling your chin and abdomen in while sitting or standing. If you are sitting, sit in a firm chair with your buttocks against the back of the chair. While sleeping, try replacing your pillow with a small towel rolled to 2 inches in diameter, or use a cervical pillow or soft cervical collar. Poor sleeping positions delay healing.  For patients with nerve root damage, which causes numbness or weakness, the use of a cervical traction apparatus may be recommended. Surgery is rarely necessary for these injuries. However, cervical strain and sprains that are present at birth (congenital) may require surgery. MEDICATION   If pain medication is necessary, nonsteroidal anti-inflammatory medications, such as aspirin and ibuprofen, or other minor pain relievers, such as acetaminophen, are often recommended.  Do not take pain medication for 7 days before surgery.  Prescription pain relievers may be given if deemed necessary by your caregiver. Use only as directed and only as much as you need. HEAT AND COLD:   Cold treatment (icing) relieves  pain and reduces inflammation. Cold treatment should be applied for 10 to 15 minutes every 2 to 3 hours for inflammation and pain  and immediately after any activity that aggravates your symptoms. Use ice packs or an ice massage.  Heat treatment may be used prior to performing the stretching and strengthening activities prescribed by your caregiver, physical therapist, or athletic trainer. Use a heat pack or a warm soak. SEEK MEDICAL CARE IF:   Symptoms get worse or do not improve in 2 weeks despite treatment.  New, unexplained symptoms develop (drugs used in treatment may produce side effects). EXERCISES RANGE OF MOTION (ROM) AND STRETCHING EXERCISES - Cervical Strain and Sprain These exercises may help you when beginning to rehabilitate your injury. In order to successfully resolve your symptoms, you must improve your posture. These exercises are designed to help reduce the forward-head and rounded-shoulder posture which contributes to this condition. Your symptoms may resolve with or without further involvement from your physician, physical therapist or athletic trainer. While completing these exercises, remember:   Restoring tissue flexibility helps normal motion to return to the joints. This allows healthier, less painful movement and activity.  An effective stretch should be held for at least 20 seconds, although you may need to begin with shorter hold times for comfort.  A stretch should never be painful. You should only feel a gentle lengthening or release in the stretched tissue. STRETCH- Axial Extensors  Lie on your back on the floor. You may bend your knees for comfort. Place a rolled-up hand towel or dish towel, about 2 inches in diameter, under the part of your head that makes contact with the floor.  Gently tuck your chin, as if trying to make a "double chin," until you feel a gentle stretch at the base of your head.  Hold __________ seconds. Repeat __________ times. Complete this exercise __________ times per day.  STRETCH - Axial Extension   Stand or sit on a firm surface. Assume a good posture: chest  up, shoulders drawn back, abdominal muscles slightly tense, knees unlocked (if standing) and feet hip width apart.  Slowly retract your chin so your head slides back and your chin slightly lowers. Continue to look straight ahead.  You should feel a gentle stretch in the back of your head. Be certain not to feel an aggressive stretch since this can cause headaches later.  Hold for __________ seconds. Repeat __________ times. Complete this exercise __________ times per day. STRETCH - Cervical Side Bend   Stand or sit on a firm surface. Assume a good posture: chest up, shoulders drawn back, abdominal muscles slightly tense, knees unlocked (if standing) and feet hip width apart.  Without letting your nose or shoulders move, slowly tip your right / left ear to your shoulder until your feel a gentle stretch in the muscles on the opposite side of your neck.  Hold __________ seconds. Repeat __________ times. Complete this exercise __________ times per day. STRETCH - Cervical Rotators   Stand or sit on a firm surface. Assume a good posture: chest up, shoulders drawn back, abdominal muscles slightly tense, knees unlocked (if standing) and feet hip width apart.  Keeping your eyes level with the ground, slowly turn your head until you feel a gentle stretch along the back and opposite side of your neck.  Hold __________ seconds. Repeat __________ times. Complete this exercise __________ times per day. RANGE OF MOTION - Neck Circles   Stand or sit on a firm surface. Assume a  good posture: chest up, shoulders drawn back, abdominal muscles slightly tense, knees unlocked (if standing) and feet hip width apart.  Gently roll your head down and around from the back of one shoulder to the back of the other. The motion should never be forced or painful.  Repeat the motion 10-20 times, or until you feel the neck muscles relax and loosen. Repeat __________ times. Complete the exercise __________ times per  day. STRENGTHENING EXERCISES - Cervical Strain and Sprain These exercises may help you when beginning to rehabilitate your injury. They may resolve your symptoms with or without further involvement from your physician, physical therapist, or athletic trainer. While completing these exercises, remember:   Muscles can gain both the endurance and the strength needed for everyday activities through controlled exercises.  Complete these exercises as instructed by your physician, physical therapist, or athletic trainer. Progress the resistance and repetitions only as guided.  You may experience muscle soreness or fatigue, but the pain or discomfort you are trying to eliminate should never worsen during these exercises. If this pain does worsen, stop and make certain you are following the directions exactly. If the pain is still present after adjustments, discontinue the exercise until you can discuss the trouble with your clinician. STRENGTH - Cervical Flexors, Isometric  Face a wall, standing about 6 inches away. Place a small pillow, a ball about 6-8 inches in diameter, or a folded towel between your forehead and the wall.  Slightly tuck your chin and gently push your forehead into the soft object. Push only with mild to moderate intensity, building up tension gradually. Keep your jaw and forehead relaxed.  Hold 10 to 20 seconds. Keep your breathing relaxed.  Release the tension slowly. Relax your neck muscles completely before you start the next repetition. Repeat __________ times. Complete this exercise __________ times per day. STRENGTH- Cervical Lateral Flexors, Isometric   Stand about 6 inches away from a wall. Place a small pillow, a ball about 6-8 inches in diameter, or a folded towel between the side of your head and the wall.  Slightly tuck your chin and gently tilt your head into the soft object. Push only with mild to moderate intensity, building up tension gradually. Keep your jaw and  forehead relaxed.  Hold 10 to 20 seconds. Keep your breathing relaxed.  Release the tension slowly. Relax your neck muscles completely before you start the next repetition. Repeat __________ times. Complete this exercise __________ times per day. STRENGTH - Cervical Extensors, Isometric   Stand about 6 inches away from a wall. Place a small pillow, a ball about 6-8 inches in diameter, or a folded towel between the back of your head and the wall.  Slightly tuck your chin and gently tilt your head back into the soft object. Push only with mild to moderate intensity, building up tension gradually. Keep your jaw and forehead relaxed.  Hold 10 to 20 seconds. Keep your breathing relaxed.  Release the tension slowly. Relax your neck muscles completely before you start the next repetition. Repeat __________ times. Complete this exercise __________ times per day. POSTURE AND BODY MECHANICS CONSIDERATIONS - Cervical Strain and Sprain Keeping correct posture when sitting, standing or completing your activities will reduce the stress put on different body tissues, allowing injured tissues a chance to heal and limiting painful experiences. The following are general guidelines for improved posture. Your physician or physical therapist will provide you with any instructions specific to your needs. While reading these guidelines, remember:  The exercises prescribed by your provider will help you have the flexibility and strength to maintain correct postures.  The correct posture provides the optimal environment for your joints to work. All of your joints have less wear and tear when properly supported by a spine with good posture. This means you will experience a healthier, less painful body.  Correct posture must be practiced with all of your activities, especially prolonged sitting and standing. Correct posture is as important when doing repetitive low-stress activities (typing) as it is when doing a single  heavy-load activity (lifting). PROLONGED STANDING WHILE SLIGHTLY LEANING FORWARD When completing a task that requires you to lean forward while standing in one place for a long time, place either foot up on a stationary 2- to 4-inch high object to help maintain the best posture. When both feet are on the ground, the low back tends to lose its slight inward curve. If this curve flattens (or becomes too large), then the back and your other joints will experience too much stress, fatigue more quickly, and can cause pain.  RESTING POSITIONS Consider which positions are most painful for you when choosing a resting position. If you have pain with flexion-based activities (sitting, bending, stooping, squatting), choose a position that allows you to rest in a less flexed posture. You would want to avoid curling into a fetal position on your side. If your pain worsens with extension-based activities (prolonged standing, working overhead), avoid resting in an extended position such as sleeping on your stomach. Most people will find more comfort when they rest with their spine in a more neutral position, neither too rounded nor too arched. Lying on a non-sagging bed on your side with a pillow between your knees, or on your back with a pillow under your knees will often provide some relief. Keep in mind, being in any one position for a prolonged period of time, no matter how correct your posture, can still lead to stiffness. WALKING Walk with an upright posture. Your ears, shoulders, and hips should all line up. OFFICE WORK When working at a desk, create an environment that supports good, upright posture. Without extra support, muscles fatigue and lead to excessive strain on joints and other tissues. CHAIR:  A chair should be able to slide under your desk when your back makes contact with the back of the chair. This allows you to work closely.  The chair's height should allow your eyes to be level with the upper  part of your monitor and your hands to be slightly lower than your elbows.  Body position:  Your feet should make contact with the floor. If this is not possible, use a foot rest.  Keep your ears over your shoulders. This will reduce stress on your neck and low back. Document Released: 01/24/2005 Document Revised: 06/10/2013 Document Reviewed: 05/08/2008 [facility] Patient Information 2015 [place], [place]. This information is not intended to replace advice given to you by your health care provider. Make sure you discuss any questions you have with your health care provider.

## 2014-07-28 NOTE — ED Notes (Signed)
Pt has been suffering from right shoulder pain since Saturday.  She denies any injury to the shoulder and states she does have arthritis issues.

## 2014-07-29 ENCOUNTER — Encounter (HOSPITAL_COMMUNITY): Payer: Self-pay | Admitting: Emergency Medicine

## 2014-07-29 ENCOUNTER — Emergency Department (HOSPITAL_COMMUNITY)
Admission: EM | Admit: 2014-07-29 | Discharge: 2014-07-29 | Disposition: A | Discharge status: Home / Self Care | Payer: No Typology Code available for payment source | Attending: Emergency Medicine | Admitting: Emergency Medicine

## 2014-07-29 ENCOUNTER — Emergency Department (HOSPITAL_COMMUNITY): Payer: No Typology Code available for payment source

## 2014-07-29 DIAGNOSIS — Z79899 Other long term (current) drug therapy: Secondary | ICD-10-CM | POA: Insufficient documentation

## 2014-07-29 DIAGNOSIS — K589 Irritable bowel syndrome without diarrhea: Secondary | ICD-10-CM | POA: Insufficient documentation

## 2014-07-29 DIAGNOSIS — N390 Urinary tract infection, site not specified: Secondary | ICD-10-CM

## 2014-07-29 DIAGNOSIS — E119 Type 2 diabetes mellitus without complications: Secondary | ICD-10-CM | POA: Insufficient documentation

## 2014-07-29 DIAGNOSIS — Z8742 Personal history of other diseases of the female genital tract: Secondary | ICD-10-CM | POA: Insufficient documentation

## 2014-07-29 DIAGNOSIS — N183 Chronic kidney disease, stage 3 (moderate): Secondary | ICD-10-CM | POA: Insufficient documentation

## 2014-07-29 DIAGNOSIS — I129 Hypertensive chronic kidney disease with stage 1 through stage 4 chronic kidney disease, or unspecified chronic kidney disease: Secondary | ICD-10-CM | POA: Insufficient documentation

## 2014-07-29 DIAGNOSIS — G8929 Other chronic pain: Secondary | ICD-10-CM | POA: Insufficient documentation

## 2014-07-29 DIAGNOSIS — F329 Major depressive disorder, single episode, unspecified: Secondary | ICD-10-CM | POA: Insufficient documentation

## 2014-07-29 DIAGNOSIS — Z794 Long term (current) use of insulin: Secondary | ICD-10-CM | POA: Insufficient documentation

## 2014-07-29 DIAGNOSIS — M25511 Pain in right shoulder: Secondary | ICD-10-CM

## 2014-07-29 DIAGNOSIS — Z8774 Personal history of (corrected) congenital malformations of heart and circulatory system: Secondary | ICD-10-CM | POA: Insufficient documentation

## 2014-07-29 DIAGNOSIS — E785 Hyperlipidemia, unspecified: Secondary | ICD-10-CM | POA: Insufficient documentation

## 2014-07-29 DIAGNOSIS — Z862 Personal history of diseases of the blood and blood-forming organs and certain disorders involving the immune mechanism: Secondary | ICD-10-CM | POA: Insufficient documentation

## 2014-07-29 DIAGNOSIS — F419 Anxiety disorder, unspecified: Secondary | ICD-10-CM | POA: Insufficient documentation

## 2014-07-29 LAB — URINALYSIS, ROUTINE W REFLEX MICROSCOPIC
Bilirubin Urine: NEGATIVE
Glucose, UA: NEGATIVE mg/dL
Ketones, ur: NEGATIVE mg/dL
Nitrite: POSITIVE — AB
Protein, ur: 30 mg/dL — AB
Specific Gravity, Urine: 1.007 (ref 1.005–1.030)
Urobilinogen, UA: 0.2 mg/dL (ref 0.0–1.0)
pH: 5 (ref 5.0–8.0)

## 2014-07-29 LAB — URINE MICROSCOPIC-ADD ON

## 2014-07-29 MED ORDER — CEPHALEXIN 500 MG PO CAPS: 500.0000 mg | ORAL_CAPSULE | Freq: Three times a day (TID) | ORAL | Status: DC

## 2014-07-29 MED ORDER — IBUPROFEN 200 MG PO TABS
600.0000 mg | ORAL_TABLET | Freq: Once | ORAL | Status: AC
  Administered 2014-07-29: 600 mg via ORAL
  Filled 2014-07-29: qty 3

## 2014-07-29 MED ORDER — OXYCODONE-ACETAMINOPHEN 5-325 MG PO TABS: 1.0000 | ORAL_TABLET | ORAL | Status: DC | PRN

## 2014-07-29 MED ORDER — OXYCODONE-ACETAMINOPHEN 5-325 MG PO TABS
1.0000 | ORAL_TABLET | Freq: Once | ORAL | Status: DC
  Filled 2014-07-29: qty 1

## 2014-07-29 MED ORDER — CEPHALEXIN 500 MG PO CAPS
500.0000 mg | ORAL_CAPSULE | Freq: Once | ORAL | Status: AC
  Administered 2014-07-29: 500 mg via ORAL
  Filled 2014-07-29: qty 1

## 2014-07-29 NOTE — ED Notes (Signed)
Patient transported to X-ray 

## 2014-07-29 NOTE — ED Notes (Signed)
Awake. Verbally responsive. A/O x4. Resp even and unlabored. No audible adventitious breath sounds noted. ABC's intact.  

## 2014-07-29 NOTE — ED Notes (Signed)
Pt reports R shoulder pain since Saturday. No injury. Pt went to urgent care yesterday, was given prescription, but prescription not helping. Wants x ray of shoulder. Also having urinary frequency, thinks she may have a UTI.

## 2014-07-29 NOTE — Discharge Instructions (Signed)
Arthralgia °Your caregiver has diagnosed you as suffering from an arthralgia. Arthralgia means there is pain in a joint. This can come from many reasons including: °· Bruising the joint which causes soreness (inflammation) in the joint. °· Wear and tear on the joints which occur as we grow older (osteoarthritis). °· Overusing the joint. °· Various forms of arthritis. °· Infections of the joint. °Regardless of the cause of pain in your joint, most of these different pains respond to anti-inflammatory drugs and rest. The exception to this is when a joint is infected, and these cases are treated with antibiotics, if it is a bacterial infection. °HOME CARE INSTRUCTIONS  °· Rest the injured area for as long as directed by your caregiver. Then slowly start using the joint as directed by your caregiver and as the pain allows. Crutches as directed may be useful if the ankles, knees or hips are involved. If the knee was splinted or casted, continue use and care as directed. If an stretchy or elastic wrapping bandage has been applied today, it should be removed and re-applied every 3 to 4 hours. It should not be applied tightly, but firmly enough to keep swelling down. Watch toes and feet for swelling, bluish discoloration, coldness, numbness or excessive pain. If any of these problems (symptoms) occur, remove the ace bandage and re-apply more loosely. If these symptoms persist, contact your caregiver or return to this location. °· For the first 24 hours, keep the injured extremity elevated on pillows while lying down. °· Apply ice for 15-20 minutes to the sore joint every couple hours while awake for the first half day. Then 03-04 times per day for the first 48 hours. Put the ice in a plastic bag and place a towel between the bag of ice and your skin. °· Wear any splinting, casting, elastic bandage applications, or slings as instructed. °· Only take over-the-counter or prescription medicines for pain, discomfort, or fever as  directed by your caregiver. Do not use aspirin immediately after the injury unless instructed by your physician. Aspirin can cause increased bleeding and bruising of the tissues. °· If you were given crutches, continue to use them as instructed and do not resume weight bearing on the sore joint until instructed. °Persistent pain and inability to use the sore joint as directed for more than 2 to 3 days are warning signs indicating that you should see a caregiver for a follow-up visit as soon as possible. Initially, a hairline fracture (break in bone) may not be evident on X-rays. Persistent pain and swelling indicate that further evaluation, non-weight bearing or use of the joint (use of crutches or slings as instructed), or further X-rays are indicated. X-rays may sometimes not show a small fracture until a week or 10 days later. Make a follow-up appointment with your own caregiver or one to whom we have referred you. A radiologist (specialist in reading X-rays) may read your X-rays. Make sure you know how you are to obtain your X-ray results. Do not assume everything is normal if you do not hear from us. °SEEK MEDICAL CARE IF: °Bruising, swelling, or pain increases. °SEEK IMMEDIATE MEDICAL CARE IF:  °· Your fingers or toes are numb or blue. °· The pain is not responding to medications and continues to stay the same or get worse. °· The pain in your joint becomes severe. °· You develop a fever over 102° F (38.9° C). °· It becomes impossible to move or use the joint. °MAKE SURE YOU:  °·   Understand these instructions.  Will watch your condition.  Will get help right away if you are not doing well or get worse. Document Released: 01/24/2005 Document Revised: 04/18/2011 Document Reviewed: 09/12/2007 [facility] Patient Information 2015 [place], [place]. This information is not intended to replace advice given to you by your health care provider. Make sure you discuss any questions you have with your health care  provider.  Urinary Tract Infection Urinary tract infections (UTIs) can develop anywhere along your urinary tract. Your urinary tract is your body's drainage system for removing wastes and extra water. Your urinary tract includes two kidneys, two ureters, a bladder, and a urethra. Your kidneys are a pair of bean-shaped organs. Each kidney is about the size of your fist. They are located below your ribs, one on each side of your spine. CAUSES Infections are caused by microbes, which are microscopic organisms, including fungi, viruses, and bacteria. These organisms are so small that they can only be seen through a microscope. Bacteria are the microbes that most commonly cause UTIs. SYMPTOMS  Symptoms of UTIs may vary by age and gender of the patient and by the location of the infection. Symptoms in young women typically include a frequent and intense urge to urinate and a painful, burning feeling in the bladder or urethra during urination. Older women and men are more likely to be tired, shaky, and weak and have muscle aches and abdominal pain. A fever may mean the infection is in your kidneys. Other symptoms of a kidney infection include pain in your back or sides below the ribs, nausea, and vomiting. DIAGNOSIS To diagnose a UTI, your caregiver will ask you about your symptoms. Your caregiver also will ask to provide a urine sample. The urine sample will be tested for bacteria and white blood cells. White blood cells are made by your body to help fight infection. TREATMENT  Typically, UTIs can be treated with medication. Because most UTIs are caused by a bacterial infection, they usually can be treated with the use of antibiotics. The choice of antibiotic and length of treatment depend on your symptoms and the type of bacteria causing your infection. HOME CARE INSTRUCTIONS  If you were prescribed antibiotics, take them exactly as your caregiver instructs you. Finish the medication even if you feel better  after you have only taken some of the medication.  Drink enough water and fluids to keep your urine clear or pale yellow.  Avoid caffeine, tea, and carbonated beverages. They tend to irritate your bladder.  Empty your bladder often. Avoid holding urine for long periods of time.  Empty your bladder before and after sexual intercourse.  After a bowel movement, women should cleanse from front to back. Use each tissue only once. SEEK MEDICAL CARE IF:   You have back pain.  You develop a fever.  Your symptoms do not begin to resolve within 3 days. SEEK IMMEDIATE MEDICAL CARE IF:   You have severe back pain or lower abdominal pain.  You develop chills.  You have nausea or vomiting.  You have continued burning or discomfort with urination. MAKE SURE YOU:   Understand these instructions.  Will watch your condition.  Will get help right away if you are not doing well or get worse. Document Released: 11/03/2004 Document Revised: 07/26/2011 Document Reviewed: 03/04/2011 [facility] Patient Information 2015 [place], [place]. This information is not intended to replace advice given to you by your health care provider. Make sure you discuss any questions you have with your health  care provider.  

## 2014-07-29 NOTE — ED Notes (Signed)
MD at bedside. EDP KOHUT PRESENT

## 2014-07-30 ENCOUNTER — Ambulatory Visit (HOSPITAL_COMMUNITY): Admission: RE | Admit: 2014-07-30 | Payer: Self-pay | Source: Ambulatory Visit | Admitting: Gastroenterology

## 2014-07-30 ENCOUNTER — Encounter (HOSPITAL_COMMUNITY): Admission: RE | Payer: Self-pay | Source: Ambulatory Visit

## 2014-07-30 SURGERY — COLONOSCOPY WITH PROPOFOL
Anesthesia: Monitor Anesthesia Care

## 2014-07-31 ENCOUNTER — Telehealth: Payer: Self-pay | Admitting: Internal Medicine

## 2014-07-31 ENCOUNTER — Emergency Department (HOSPITAL_COMMUNITY)
Admission: EM | Admit: 2014-07-31 | Discharge: 2014-07-31 | Disposition: A | Discharge status: Home / Self Care | Payer: No Typology Code available for payment source | Attending: Emergency Medicine | Admitting: Emergency Medicine

## 2014-07-31 ENCOUNTER — Encounter (HOSPITAL_COMMUNITY): Payer: Self-pay | Admitting: Emergency Medicine

## 2014-07-31 DIAGNOSIS — Z9889 Other specified postprocedural states: Secondary | ICD-10-CM | POA: Insufficient documentation

## 2014-07-31 DIAGNOSIS — E119 Type 2 diabetes mellitus without complications: Secondary | ICD-10-CM | POA: Insufficient documentation

## 2014-07-31 DIAGNOSIS — G8929 Other chronic pain: Secondary | ICD-10-CM | POA: Insufficient documentation

## 2014-07-31 DIAGNOSIS — F419 Anxiety disorder, unspecified: Secondary | ICD-10-CM | POA: Insufficient documentation

## 2014-07-31 DIAGNOSIS — Z87440 Personal history of urinary (tract) infections: Secondary | ICD-10-CM | POA: Insufficient documentation

## 2014-07-31 DIAGNOSIS — Z79899 Other long term (current) drug therapy: Secondary | ICD-10-CM | POA: Insufficient documentation

## 2014-07-31 DIAGNOSIS — F329 Major depressive disorder, single episode, unspecified: Secondary | ICD-10-CM | POA: Insufficient documentation

## 2014-07-31 DIAGNOSIS — E785 Hyperlipidemia, unspecified: Secondary | ICD-10-CM | POA: Insufficient documentation

## 2014-07-31 DIAGNOSIS — Z8742 Personal history of other diseases of the female genital tract: Secondary | ICD-10-CM | POA: Insufficient documentation

## 2014-07-31 DIAGNOSIS — Z8719 Personal history of other diseases of the digestive system: Secondary | ICD-10-CM | POA: Insufficient documentation

## 2014-07-31 DIAGNOSIS — Z792 Long term (current) use of antibiotics: Secondary | ICD-10-CM | POA: Insufficient documentation

## 2014-07-31 DIAGNOSIS — Z862 Personal history of diseases of the blood and blood-forming organs and certain disorders involving the immune mechanism: Secondary | ICD-10-CM | POA: Insufficient documentation

## 2014-07-31 DIAGNOSIS — M25511 Pain in right shoulder: Secondary | ICD-10-CM

## 2014-07-31 DIAGNOSIS — Q6102 Congenital multiple renal cysts: Secondary | ICD-10-CM | POA: Insufficient documentation

## 2014-07-31 DIAGNOSIS — Z8774 Personal history of (corrected) congenital malformations of heart and circulatory system: Secondary | ICD-10-CM | POA: Insufficient documentation

## 2014-07-31 DIAGNOSIS — Z794 Long term (current) use of insulin: Secondary | ICD-10-CM | POA: Insufficient documentation

## 2014-07-31 DIAGNOSIS — N183 Chronic kidney disease, stage 3 (moderate): Secondary | ICD-10-CM | POA: Insufficient documentation

## 2014-07-31 DIAGNOSIS — I129 Hypertensive chronic kidney disease with stage 1 through stage 4 chronic kidney disease, or unspecified chronic kidney disease: Secondary | ICD-10-CM | POA: Insufficient documentation

## 2014-07-31 LAB — CBG MONITORING, ED: Glucose-Capillary: 274 mg/dL — ABNORMAL HIGH (ref 65–99)

## 2014-07-31 NOTE — Discharge Instructions (Signed)
Acromioclavicular Injuries °The AC (acromioclavicular) joint is the joint in the shoulder where the collarbone (clavicle) meets the shoulder blade (scapula). The part of the shoulder blade connected to the collarbone is called the acromion. Common problems with and treatments for the AC joint are detailed below. °ARTHRITIS °Arthritis occurs when the joint has been injured and the smooth padding between the joints (cartilage) is lost. This is the wear and tear seen in most joints of the body if they have been overused. This causes the joint to produce pain and swelling which is worse with activity.  °AC JOINT SEPARATION °AC joint separation means that the ligaments connecting the acromion of the shoulder blade and collarbone have been damaged, and the two bones no longer line up. AC separations can be anywhere from mild to severe, and are "graded" depending upon which ligaments are torn and how badly they are torn. °· Grade I Injury: the least damage is done, and the AC joint still lines up. °· Grade II Injury: damage to the ligaments which reinforce the AC joint. In a Grade II injury, these ligaments are stretched but not entirely torn. When stressed, the AC joint becomes painful and unstable. °· Grade III Injury: AC and secondary ligaments are completely torn, and the collarbone is no longer attached to the shoulder blade. This results in deformity; a prominence of the end of the clavicle. °AC JOINT FRACTURE °AC joint fracture means that there has been a break in the bones of the AC joint, usually the end of the clavicle. °TREATMENT °TREATMENT OF AC ARTHRITIS °· There is currently no way to replace the cartilage damaged by arthritis. The best way to improve the condition is to decrease the activities which aggravate the problem. Application of ice to the joint helps decrease pain and soreness (inflammation). The use of non-steroidal anti-inflammatory medication is helpful. °· If less conservative measures do not  work, then cortisone shots (injections) may be used. These are anti-inflammatories; they decrease the soreness in the joint and swelling. °· If non-surgical measures fail, surgery may be recommended. The procedure is generally removal of a portion of the end of the clavicle. This is the part of the collarbone closest to your acromion which is stabilized with ligaments to the acromion of the shoulder blade. This surgery may be performed using a tube-like instrument with a light (arthroscope) for looking into a joint. It may also be performed as an open surgery through a small incision by the surgeon. Most patients will have good range of motion within 6 weeks and may return to all activity including sports by 8-12 weeks, barring complications. °TREATMENT OF AN AC SEPARATION °· The initial treatment is to decrease pain. This is best accomplished by immobilizing the arm in a sling and placing an ice pack to the shoulder for 20 to 30 minutes every 2 hours as needed. As the pain starts to subside, it is important to begin moving the fingers, wrist, elbow and eventually the shoulder in order to prevent a stiff or "frozen" shoulder. Instruction on when and how much to move the shoulder will be provided by your caregiver. The length of time needed to regain full motion and function depends on the amount or grade of the injury. Recovery from a Grade I AC separation usually takes 10 to 14 days, whereas a Grade III may take 6 to 8 weeks. °· Grade I and II separations usually do not require surgery. Even Grade III injuries usually allow return to full   activity with few restrictions. Treatment is also based on the activity demands of the injured shoulder. For example, a high level quarterback with an injured throwing arm will receive more aggressive treatment than someone with a desk job who rarely uses his/her arm for strenuous activities. In some cases, a painful lump may persist which could require a later surgery. Surgery  can be very successful, but the benefits must be weighed against the potential risks. TREATMENT OF AN AC JOINT FRACTURE Fracture treatment depends on the type of fracture. Sometimes a splint or sling may be all that is required. Other times surgery may be required for repair. This is more frequently the case when the ligaments supporting the clavicle are completely torn. Your caregiver will help you with these decisions and together you can decide what will be the best treatment. HOME CARE INSTRUCTIONS   Apply ice to the injury for 15-20 minutes each hour while awake for 2 days. Put the ice in a plastic bag and place a towel between the bag of ice and skin.  If a sling has been applied, wear it constantly for as long as directed by your caregiver, even at night. The sling or splint can be removed for bathing or showering or as directed. Be sure to keep the shoulder in the same place as when the sling is on. Do not lift the arm.  If a figure-of-eight splint has been applied it should be tightened gently by another person every day. Tighten it enough to keep the shoulders held back. Allow enough room to place the index finger between the body and strap. Loosen the splint immediately if there is numbness or tingling in the hands.  Take over-the-counter or prescription medicines for pain, discomfort or fever as directed by your caregiver.  If you or your child has received a follow up appointment, it is very important to keep that appointment in order to avoid long term complications, chronic pain or disability. SEEK MEDICAL CARE IF:   The pain is not relieved with medications.  There is increased swelling or discoloration that continues to get worse rather than better.  You or your child has been unable to follow up as instructed.  There is progressive numbness and tingling in the arm, forearm or hand. SEEK IMMEDIATE MEDICAL CARE IF:   The arm is numb, cold or pale.  There is increasing pain  in the hand, forearm or fingers. MAKE SURE YOU:   Understand these instructions.  Will watch your condition.  Will get help right away if you are not doing well or get worse. Document Released: 11/03/2004 Document Revised: 04/18/2011 Document Reviewed: 04/28/2008 [facility] Patient Information 2015 [place], [place]. This information is not intended to replace advice given to you by your health care provider. Make sure you discuss any questions you have with your health care provider. Shoulder Pain The shoulder is the joint that connects your arms to your body. The bones that form the shoulder joint include the upper arm bone (humerus), the shoulder blade (scapula), and the collarbone (clavicle). The top of the humerus is shaped like a ball and fits into a rather flat socket on the scapula (glenoid cavity). A combination of muscles and strong, fibrous tissues that connect muscles to bones (tendons) support your shoulder joint and hold the ball in the socket. Small, fluid-filled sacs (bursae) are located in different areas of the joint. They act as cushions between the bones and the overlying soft tissues and help reduce friction between  the gliding tendons and the bone as you move your arm. Your shoulder joint allows a wide range of motion in your arm. This range of motion allows you to do things like scratch your back or throw a ball. However, this range of motion also makes your shoulder more prone to pain from overuse and injury. Causes of shoulder pain can originate from both injury and overuse and usually can be grouped in the following four categories:  Redness, swelling, and pain (inflammation) of the tendon (tendinitis) or the bursae (bursitis).  Instability, such as a dislocation of the joint.  Inflammation of the joint (arthritis).  Broken bone (fracture). HOME CARE INSTRUCTIONS   Apply ice to the sore area.  Put ice in a plastic bag.  Place a towel between your skin and the  bag.  Leave the ice on for 15-20 minutes, 3-4 times per day for the first 2 days, or as directed by your health care provider.  Stop using cold packs if they do not help with the pain.  If you have a shoulder sling or immobilizer, wear it as long as your caregiver instructs. Only remove it to shower or bathe. Move your arm as little as possible, but keep your hand moving to prevent swelling.  Squeeze a soft ball or foam pad as much as possible to help prevent swelling.  Only take over-the-counter or prescription medicines for pain, discomfort, or fever as directed by your caregiver. SEEK MEDICAL CARE IF:   Your shoulder pain increases, or new pain develops in your arm, hand, or fingers.  Your hand or fingers become cold and numb.  Your pain is not relieved with medicines. SEEK IMMEDIATE MEDICAL CARE IF:   Your arm, hand, or fingers are numb or tingling.  Your arm, hand, or fingers are significantly swollen or turn white or blue. MAKE SURE YOU:   Understand these instructions.  Will watch your condition.  Will get help right away if you are not doing well or get worse. Document Released: 11/03/2004 Document Revised: 06/10/2013 Document Reviewed: 01/08/2011 [facility] Patient Information 2015 [place], [place]. This information is not intended to replace advice given to you by your health care provider. Make sure you discuss any questions you have with your health care provider.

## 2014-07-31 NOTE — Telephone Encounter (Signed)
Patient calling stating she went to the ed and urgent care about pain in her right shoulder. She states the medication they gave her is not helping. Would like to speak to a nurse since the next appointment available is on Monday.

## 2014-07-31 NOTE — ED Provider Notes (Signed)
CSN: [id number]     Arrival date & time 07/31/14  1659 History  This chart was scribed for non-physician practitioner, Montine Circle, PA-C, working with Dorie Rank, MD, by Sima Matas, ED Scribe. This patient was seen in room TR10C/TR10C and the patient's care was started at 5:38 PM.   Chief Complaint  Patient presents with  . Shoulder Pain     The history is provided by the patient. No language interpreter was used.    HPI Comments: [name] is a 59 y.o. female who presents to the Emergency Department with a chief complaint of constant, moderate pain to the right scapular region for the past week.  Patient was seen at [facility] 2 days ago, where imaging revealed unremarkable findings.  She was prescribed tramadol but only reports with mild relief.  She denies any prior falls, injury or trauma. She has not seen an orthopedic specialist.     Past Medical History  Diagnosis Date  . Diabetes mellitus type II, uncontrolled   . Hypertension   . Hyperlipidemia   . Ovarian cyst, left   . Anemia     due to menorrhagia, BL 8-10  . Vaginal cyst     nabothian and bartholin  . CKD (chronic kidney disease) stage 3, GFR 30-59 ml/min     baseline creatinine 1.4-1.7  . Anxiety   . Depression   . Postmenopausal bleeding 06/12/2008  . 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 UTI (lower urinary tract infection)   . IBS (irritable bowel syndrome)   . Chronic back pain   . Chronic abdominal pain   . DDD (degenerative disc disease), lumbar   . Bilateral renal cysts 01/15/2009    Qualifier: Diagnosis of  By: Tyrell Antonio MD, [place]    . Diabetes mellitus without complication   . History of palpitations     evaluated recently 12'15   Past Surgical History  Procedure Laterality Date  . Cardiac surgery      to repair congenital defect as a child- 73months old   Family History  Problem Relation Age of Onset  . Stroke Father   . Heart attack Father     Had MI in his 48s  .  Stomach cancer Paternal Grandmother   . Diabetes Maternal Grandmother   . Cerebral palsy Daughter   . Anesthesia problems Neg Hx   . Hypotension Neg Hx   . Malignant hyperthermia Neg Hx   . Pseudochol deficiency Neg Hx    History  Substance Use Topics  . Smoking status: Never Smoker   . Smokeless tobacco: Never Used  . Alcohol Use: No   OB History    Gravida Para Term Preterm AB TAB SAB Ectopic Multiple Living   3 1 1  2  2   1      Review of Systems  Constitutional: Negative for fever.  Musculoskeletal: Positive for myalgias and arthralgias.      Allergies  Review of patient's allergies indicates no known allergies.  Home Medications   Prior to Admission medications   Medication Sig Start Date End Date Taking? Authorizing Provider  acetaminophen (TYLENOL) 325 MG tablet Take 2 tablets (650 mg total) by mouth every 6 (six) hours as needed for moderate pain. Patient not taking: Reported on 07/29/2014 04/29/14   [name], MD  acetaminophen (TYLENOL) 500 MG tablet Take 500-1,000 mg by mouth every 6 (six) hours as needed for moderate pain.    Historical Provider, MD  cephALEXin (KEFLEX) 500 MG capsule Take 1 capsule (500 mg total) by mouth 3 (three) times daily. 07/29/14   Virgel Manifold, MD  cyclobenzaprine (FLEXERIL) 5 MG tablet Take 1 tablet (5 mg total) by mouth at bedtime as needed for muscle spasms. Patient not taking: Reported on 07/29/2014 07/28/14   [name], MD  enalapril (VASOTEC) 10 MG tablet Take 1 tablet (10 mg total) by mouth daily. 06/08/14   Juliet Rude, MD  Insulin Glargine (LANTUS SOLOSTAR) 100 UNIT/ML Solostar Pen Inject 22 Units into the skin daily at 10 pm. 06/10/14   Juliet Rude, MD  oxyCODONE-acetaminophen (PERCOCET/ROXICET) 5-325 MG per tablet Take 1 tablet by mouth every 4 (four) hours as needed for severe pain. 07/29/14   Virgel Manifold, MD  pioglitazone (ACTOS) 15 MG tablet Take 1 tablet (15 mg total) by mouth daily. Patient not taking: Reported on  07/29/2014 07/11/14   Milagros Loll, MD  simvastatin (ZOCOR) 40 MG tablet TAKE 1 TABLET (40 MG TOTAL) BY MOUTH AT BEDTIME. Patient not taking: Reported on 07/29/2014 11/26/13   Ejiroghene Arlyce Dice, MD  traMADol (ULTRAM) 50 MG tablet Take 1 tablet (50 mg total) by mouth every 6 (six) hours as needed. Patient not taking: Reported on 07/29/2014 07/28/14   [name], MD   Triage Vitals: BP 124/72 mmHg  Pulse 94  Temp(Src) 98.6 F (37 C) (Oral)  Resp 16  SpO2 96%  Physical Exam  Constitutional: She is oriented to person, place, and time. She appears well-developed and well-nourished. No distress.  HENT:  Head: Normocephalic and atraumatic.  Eyes: Conjunctivae and EOM are normal.  Neck: Neck supple. No tracheal deviation present.  Cardiovascular: Normal rate.   Pulmonary/Chest: Effort normal. No respiratory distress.  Musculoskeletal: Normal range of motion.  Right shoulder range of motion strength 5/5, no bony abnormality or deformity, there is some tenderness to palpation over the scapula, which is likely muscular  Neurological: She is alert and oriented to person, place, and time.  Skin: Skin is warm and dry.  Psychiatric: She has a normal mood and affect. Her behavior is normal.  Nursing note and vitals reviewed.   ED Course  Procedures (including critical care time) DIAGNOSTIC STUDIES: Oxygen Saturation is 96% on room air, adequate by my interpretation.    COORDINATION OF CARE: 5:43 PM- Pt advised of plan for treatment which includes shoulder sling and ultram however patient refused.  Patient requested to have an MRI, however she was informed that she would need to follow up with a shoulder specialist.  Labs Review Labs Reviewed  CBG MONITORING, ED - Abnormal; Notable for the following:    Glucose-Capillary 274 (*)    All other components within normal limits    Imaging Review No results found.   EKG Interpretation None      MDM   Final diagnoses:  Shoulder  pain, acute, right    Patient with persistent right shoulder pain. Patient is able to move the shoulder through all ranges of motion. There is no evidence of infection or cellulitis. There is no bony abnormality or deformity. Patient had normal plain films couple days ago at [facility]. Will recommend orthopedic follow-up. Patient understands and agrees to plan. She is stable and ready for discharge.  I personally performed the services described in this documentation, which was scribed in my presence. The recorded information has been reviewed and is accurate.    Montine Circle, PA-C 07/31/14 1756  Dorie Rank, MD 08/01/14 1420

## 2014-07-31 NOTE — Telephone Encounter (Signed)
Called pt back, she is not having shoulder pain, she is having flank pain, appr 6 days, she states none of the pain medicine truly helps, she would like to be seen asap, appt given for tomorrow at 1315 dr Heber [place], she asks what if it gets worse, advised to go to ED, she is agreeable

## 2014-07-31 NOTE — ED Notes (Signed)
Pt c/o right shoulder pain x 1 week; pt seen at [facility] for same; pt sts some sweating at times; pt sts takes CBG in morning and has been fine

## 2014-08-01 ENCOUNTER — Ambulatory Visit: Payer: No Typology Code available for payment source | Admitting: Internal Medicine

## 2014-08-01 ENCOUNTER — Encounter: Payer: Self-pay | Admitting: Internal Medicine

## 2014-08-01 ENCOUNTER — Ambulatory Visit (INDEPENDENT_AMBULATORY_CARE_PROVIDER_SITE_OTHER): Payer: No Typology Code available for payment source | Admitting: Internal Medicine

## 2014-08-01 VITALS — BP 164/73 | HR 98 | Temp 98.1°F | Wt 166.2 lb

## 2014-08-01 DIAGNOSIS — I129 Hypertensive chronic kidney disease with stage 1 through stage 4 chronic kidney disease, or unspecified chronic kidney disease: Secondary | ICD-10-CM

## 2014-08-01 DIAGNOSIS — M791 Myalgia: Secondary | ICD-10-CM

## 2014-08-01 DIAGNOSIS — M7918 Myalgia, other site: Secondary | ICD-10-CM

## 2014-08-01 DIAGNOSIS — I10 Essential (primary) hypertension: Secondary | ICD-10-CM

## 2014-08-01 DIAGNOSIS — E1122 Type 2 diabetes mellitus with diabetic chronic kidney disease: Secondary | ICD-10-CM

## 2014-08-01 DIAGNOSIS — N183 Chronic kidney disease, stage 3 unspecified: Secondary | ICD-10-CM

## 2014-08-01 LAB — URINE CULTURE: Culture: >=100000

## 2014-08-01 LAB — GLUCOSE, CAPILLARY: GLUCOSE-CAPILLARY: 242 mg/dL — AB (ref 65–99)

## 2014-08-01 MED ORDER — CYCLOBENZAPRINE HCL 5 MG PO TABS: 5.0000 mg | ORAL_TABLET | Freq: Every evening | ORAL | Status: DC | PRN

## 2014-08-01 NOTE — Assessment & Plan Note (Signed)
-  CBG checked for nursing protocol, elevated, will have patient back for follow up with PCP

## 2014-08-01 NOTE — Assessment & Plan Note (Signed)
-   BP elevated today but in acute pain - Will have patient back for pcp visit in 2 weeks to a month

## 2014-08-01 NOTE — Patient Instructions (Signed)
General Instructions:  Please use heat to the area and take the flexeril (you can take 2 tablets if you need to) Please bring your medicines with you each time you come to clinic.  Medicines may include prescription medications, over-the-counter medications, herbal remedies, eye drops, vitamins, or other pills.   Progress Toward Treatment Goals:  Treatment Goal 07/11/2014  Hemoglobin A1C unchanged  Blood pressure at goal    Self Care Goals & Plans:  Self Care Goal 07/11/2014  Manage my medications take my medicines as prescribed; bring my medications to every visit; refill my medications on time  Monitor my health keep track of my blood glucose; bring my glucose meter and log to each visit; check my feet daily  Eat healthy foods drink diet soda or water instead of juice or soda; eat more vegetables; eat foods that are low in salt; eat baked foods instead of fried foods  Be physically active take a walk every day  Meeting treatment goals -    Home Blood Glucose Monitoring 06/17/2014  Check my blood sugar 2 times a day  When to check my blood sugar before breakfast; before dinner     Care Management & Community Referrals:  Referral 06/17/2014  Referrals made for care management support financial counselor

## 2014-08-01 NOTE — Progress Notes (Signed)
[facility] Subjective:   Patient ID: [name] female   DOB: 30-Jun-1955 59 y.o.   MRN: [id number]  HPI: [name] is a 59 y.o. female with a PMH detailed below who frequently visits the emergency department for her care.  She has been to Urgent care on 6/20 for right shoulder pain, 6/21 to the ED for the same complaint, and 6/23 again for right shoulder pain. Xrays were unremarkable she she was diagnosed with a muscle strain and given supportive treatment and suggested to follow up with Orthopaedics.  She presents today for an acute visit for right shoulder pain, she notes that the pain is towards her sacpula and has been present for about 6 days, she notes that she has to take care of her daughter who has CP and it is very difficult.  She does report that the tramadol helps some but the pain comes back, she was apparently offered percocet in the ED but she reports she cannot take anything too strong as she has to be able to care for her daugher.  She did not fill the flexeril perscription because she was worried that it was expensive    Past Medical History  Diagnosis Date  . Diabetes mellitus type II, uncontrolled   . Hypertension   . Hyperlipidemia   . Ovarian cyst, left   . Anemia     due to menorrhagia, BL 8-10  . Vaginal cyst     nabothian and bartholin  . CKD (chronic kidney disease) stage 3, GFR 30-59 ml/min     baseline creatinine 1.4-1.7  . Anxiety   . Depression   . Postmenopausal bleeding 06/12/2008  . 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 UTI (lower urinary tract infection)   . IBS (irritable bowel syndrome)   . Chronic back pain   . Chronic abdominal pain   . DDD (degenerative disc disease), lumbar   . Bilateral renal cysts 01/15/2009    Qualifier: Diagnosis of  By: Tyrell Antonio MD, Belk[place]    . Diabetes mellitus without complication   . History of palpitations     evaluated recently 12'15   Current Outpatient  Prescriptions  Medication Sig Dispense Refill  . acetaminophen (TYLENOL) 325 MG tablet Take 2 tablets (650 mg total) by mouth every 6 (six) hours as needed for moderate pain. (Patient not taking: Reported on 07/29/2014)    . acetaminophen (TYLENOL) 500 MG tablet Take 500-1,000 mg by mouth every 6 (six) hours as needed for moderate pain.    . cephALEXin (KEFLEX) 500 MG capsule Take 1 capsule (500 mg total) by mouth 3 (three) times daily. 15 capsule 0  . cyclobenzaprine (FLEXERIL) 5 MG tablet Take 1 tablet (5 mg total) by mouth at bedtime as needed for muscle spasms. 30 tablet 0  . enalapril (VASOTEC) 10 MG tablet Take 1 tablet (10 mg total) by mouth daily. 30 tablet 6  . Insulin Glargine (LANTUS SOLOSTAR) 100 UNIT/ML Solostar Pen Inject 22 Units into the skin daily at 10 pm. 15 mL 11  . oxyCODONE-acetaminophen (PERCOCET/ROXICET) 5-325 MG per tablet Take 1 tablet by mouth every 4 (four) hours as needed for severe pain. 12 tablet 0  . pioglitazone (ACTOS) 15 MG tablet Take 1 tablet (15 mg total) by mouth daily. (Patient not taking: Reported on 07/29/2014) 30 tablet 0  . simvastatin (ZOCOR) 40 MG tablet TAKE 1 TABLET (40 MG TOTAL) BY MOUTH AT BEDTIME. (Patient not taking: Reported  on 07/29/2014) 30 tablet 5  . traMADol (ULTRAM) 50 MG tablet Take 1 tablet (50 mg total) by mouth every 6 (six) hours as needed. (Patient not taking: Reported on 07/29/2014) 10 tablet 0  . [DISCONTINUED] pantoprazole (PROTONIX) 20 MG tablet Take 2 tablets (40 mg total) by mouth daily. 30 tablet 1  . [DISCONTINUED] sertraline (ZOLOFT) 100 MG tablet Take 1 tablet (100 mg total) by mouth daily. 30 tablet 2   No current facility-administered medications for this visit.   Family History  Problem Relation Age of Onset  . Stroke Father   . Heart attack Father     Had MI in his 63s  . Stomach cancer Paternal Grandmother   . Diabetes Maternal Grandmother   . Cerebral palsy Daughter   . Anesthesia problems Neg Hx   . Hypotension  Neg Hx   . Malignant hyperthermia Neg Hx   . Pseudochol deficiency Neg Hx    History   Social History  . Marital Status: Divorced    Spouse Name: N/A  . Number of Children: 1  . Years of Education: 12th grade   Occupational History  . 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 Smoking status: Never Smoker   . Smokeless tobacco: Never Used  . Alcohol Use: No  . Drug Use: No  . Sexual Activity: No   Other Topics Concern  . 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   Review of Systems: Review of Systems  Constitutional: Negative for fever, chills and malaise/fatigue.  Eyes: Negative for blurred vision.  Cardiovascular: Negative for chest pain.  Gastrointestinal: Negative for heartburn and abdominal pain.  Musculoskeletal: Positive for back pain and joint pain.  Neurological: Negative for dizziness and headaches.     Objective:  Physical Exam: Filed Vitals:   08/01/14 1326  BP: 164/73  Pulse: 98  Temp: 98.1 F (36.7 C)  TempSrc: Oral  Weight: 166 lb 3.2 oz (75.388 kg)  SpO2: 99%   Physical Exam  Constitutional: She is well-developed, well-nourished, and in no distress. No distress.  HENT:  Head: Normocephalic and atraumatic.  Eyes: Conjunctivae are normal.  Cardiovascular: Normal rate, regular rhythm, normal heart sounds and intact distal pulses.   No murmur heard. Pulmonary/Chest: Effort normal and breath sounds normal.  Abdominal: Soft. Bowel sounds are normal.  Musculoskeletal: She exhibits no edema.       Right shoulder: She exhibits normal range of motion (due to pain).       Thoracic back: She exhibits tenderness, pain and spasm.  Rhomboid muscle on the right side is spasmed and patient has significant tenderness in that area  Skin: Skin is warm and dry. She is not diaphoretic.  Psychiatric: Affect and judgment normal.  Nursing note and vitals reviewed.    Assessment & Plan:  Case  discussed with Dr. Dareen Piano  Rhomboid muscle pain - Evidence of muslce spasm on exam. - Advised to fill flexeril Rx (she may have lost Rx from ED- will provide another script) - Can continue tramadol for pain - Advised heat to area.  Essential hypertension - BP elevated today but in acute pain - Will have patient back for pcp visit in 2 weeks to a month    Medications Ordered Meds ordered this encounter  Medications  . cyclobenzaprine (FLEXERIL) 5 MG tablet    Sig: Take 1 tablet (5 mg total) by mouth at bedtime as needed  for muscle spasms.    Dispense:  30 tablet    Refill:  0   Other Orders Orders Placed This Encounter  Procedures  . Glucose, capillary   Follow Up: Return in about 1 month (around 08/31/2014).

## 2014-08-01 NOTE — Assessment & Plan Note (Signed)
-   Evidence of muslce spasm on exam. - Advised to fill flexeril Rx (she may have lost Rx from ED- will provide another script) - Can continue tramadol for pain - Advised heat to area.

## 2014-08-02 ENCOUNTER — Encounter (HOSPITAL_COMMUNITY): Payer: Self-pay | Admitting: Emergency Medicine

## 2014-08-02 ENCOUNTER — Telehealth: Payer: Self-pay | Admitting: Internal Medicine

## 2014-08-02 ENCOUNTER — Emergency Department (HOSPITAL_COMMUNITY): Payer: Self-pay

## 2014-08-02 ENCOUNTER — Emergency Department (HOSPITAL_COMMUNITY): Payer: No Typology Code available for payment source

## 2014-08-02 ENCOUNTER — Emergency Department (HOSPITAL_COMMUNITY)
Admission: EM | Admit: 2014-08-02 | Discharge: 2014-08-02 | Disposition: A | Discharge status: Home / Self Care | Payer: Self-pay | Attending: Emergency Medicine | Admitting: Emergency Medicine

## 2014-08-02 DIAGNOSIS — F419 Anxiety disorder, unspecified: Secondary | ICD-10-CM | POA: Insufficient documentation

## 2014-08-02 DIAGNOSIS — Z862 Personal history of diseases of the blood and blood-forming organs and certain disorders involving the immune mechanism: Secondary | ICD-10-CM | POA: Insufficient documentation

## 2014-08-02 DIAGNOSIS — Z8742 Personal history of other diseases of the female genital tract: Secondary | ICD-10-CM | POA: Insufficient documentation

## 2014-08-02 DIAGNOSIS — G8929 Other chronic pain: Secondary | ICD-10-CM | POA: Insufficient documentation

## 2014-08-02 DIAGNOSIS — I129 Hypertensive chronic kidney disease with stage 1 through stage 4 chronic kidney disease, or unspecified chronic kidney disease: Secondary | ICD-10-CM | POA: Insufficient documentation

## 2014-08-02 DIAGNOSIS — N183 Chronic kidney disease, stage 3 (moderate): Secondary | ICD-10-CM | POA: Insufficient documentation

## 2014-08-02 DIAGNOSIS — Z792 Long term (current) use of antibiotics: Secondary | ICD-10-CM | POA: Insufficient documentation

## 2014-08-02 DIAGNOSIS — E785 Hyperlipidemia, unspecified: Secondary | ICD-10-CM | POA: Insufficient documentation

## 2014-08-02 DIAGNOSIS — Z87440 Personal history of urinary (tract) infections: Secondary | ICD-10-CM | POA: Insufficient documentation

## 2014-08-02 DIAGNOSIS — K5900 Constipation, unspecified: Secondary | ICD-10-CM | POA: Insufficient documentation

## 2014-08-02 DIAGNOSIS — E119 Type 2 diabetes mellitus without complications: Secondary | ICD-10-CM | POA: Insufficient documentation

## 2014-08-02 DIAGNOSIS — Z79899 Other long term (current) drug therapy: Secondary | ICD-10-CM | POA: Insufficient documentation

## 2014-08-02 DIAGNOSIS — K529 Noninfective gastroenteritis and colitis, unspecified: Secondary | ICD-10-CM | POA: Insufficient documentation

## 2014-08-02 DIAGNOSIS — F329 Major depressive disorder, single episode, unspecified: Secondary | ICD-10-CM | POA: Insufficient documentation

## 2014-08-02 LAB — CBC WITH DIFFERENTIAL/PLATELET
BASOS PCT: 0 % (ref 0–1)
Basophils Absolute: 0 10*3/uL (ref 0.0–0.1)
EOS PCT: 0 % (ref 0–5)
Eosinophils Absolute: 0 10*3/uL (ref 0.0–0.7)
HCT: 37.4 % (ref 36.0–46.0)
Hemoglobin: 13.3 g/dL (ref 12.0–15.0)
LYMPHS ABS: 1.3 10*3/uL (ref 0.7–4.0)
LYMPHS PCT: 6 % — AB (ref 12–46)
MCH: 30.4 pg (ref 26.0–34.0)
MCHC: 35.6 g/dL (ref 30.0–36.0)
MCV: 85.4 fL (ref 78.0–100.0)
MONO ABS: 0.5 10*3/uL (ref 0.1–1.0)
Monocytes Relative: 2 % — ABNORMAL LOW (ref 3–12)
NEUTROS ABS: 22.2 10*3/uL — AB (ref 1.7–7.7)
Neutrophils Relative %: 92 % — ABNORMAL HIGH (ref 43–77)
Platelets: 252 10*3/uL (ref 150–400)
RBC: 4.38 MIL/uL (ref 3.87–5.11)
RDW: 11.9 % (ref 11.5–15.5)
WBC: 24.1 10*3/uL — AB (ref 4.0–10.5)

## 2014-08-02 LAB — BASIC METABOLIC PANEL
ANION GAP: 13 (ref 5–15)
BUN: 35 mg/dL — ABNORMAL HIGH (ref 6–20)
CALCIUM: 9.7 mg/dL (ref 8.9–10.3)
CO2: 27 mmol/L (ref 22–32)
Chloride: 90 mmol/L — ABNORMAL LOW (ref 101–111)
Creatinine, Ser: 1.68 mg/dL — ABNORMAL HIGH (ref 0.44–1.00)
GFR calc Af Amer: 38 mL/min — ABNORMAL LOW (ref >60)
GFR calc non Af Amer: 33 mL/min — ABNORMAL LOW (ref >60)
GLUCOSE: 340 mg/dL — AB (ref 65–99)
Potassium: 3.7 mmol/L (ref 3.5–5.1)
Sodium: 130 mmol/L — ABNORMAL LOW (ref 135–145)

## 2014-08-02 MED ORDER — ONDANSETRON HCL 4 MG/2ML IJ SOLN
4.0000 mg | Freq: Once | INTRAMUSCULAR | Status: AC
  Administered 2014-08-02: 4 mg via INTRAVENOUS
  Filled 2014-08-02: qty 2

## 2014-08-02 MED ORDER — MORPHINE SULFATE 4 MG/ML IJ SOLN
4.0000 mg | Freq: Once | INTRAMUSCULAR | Status: AC
  Administered 2014-08-02: 4 mg via INTRAVENOUS
  Filled 2014-08-02: qty 1

## 2014-08-02 MED ORDER — IOHEXOL 300 MG/ML  SOLN
80.0000 mL | Freq: Once | INTRAMUSCULAR | Status: AC | PRN
  Administered 2014-08-02: 80 mL via INTRAVENOUS

## 2014-08-02 MED ORDER — CIPROFLOXACIN HCL 500 MG PO TABS: 500.0000 mg | ORAL_TABLET | Freq: Two times a day (BID) | ORAL | Status: DC

## 2014-08-02 MED ORDER — SODIUM CHLORIDE 0.9 % IV BOLUS (SEPSIS)
1000.0000 mL | Freq: Once | INTRAVENOUS | Status: AC
  Administered 2014-08-02: 1000 mL via INTRAVENOUS

## 2014-08-02 MED ORDER — DICYCLOMINE HCL 20 MG PO TABS: 20.0000 mg | ORAL_TABLET | Freq: Two times a day (BID) | ORAL | Status: DC

## 2014-08-02 MED ORDER — ONDANSETRON 4 MG PO TBDP: ORAL_TABLET | ORAL | Status: DC

## 2014-08-02 MED ORDER — IOHEXOL 300 MG/ML  SOLN
25.0000 mL | Freq: Once | INTRAMUSCULAR | Status: AC | PRN
  Administered 2014-08-02: 25 mL via ORAL

## 2014-08-02 NOTE — ED Provider Notes (Signed)
CSN: [id number]     Arrival date & time 08/02/14  0217 History   First MD Initiated Contact with Patient 08/02/14 [phone]     Chief Complaint  Patient presents with  . Constipation  . Nausea     (Consider location/radiation/quality/duration/timing/severity/associated sxs/prior Treatment) HPI Comments: Patient is a 59 year old female with a past medical history of CKD, hypertension, and diabetes who presents with abdominal pain for the past 3 days. The pain is located in the lower abdomen and does not radiate. The pain is described as aching and severe. The pain started gradually and progressively worsened since the onset. No alleviating/aggravating factors. The patient has tried nothing for symptoms without relief. Associated symptoms include nausea, vomiting, and constipation. Patient denies fever, headache, diarrhea, chest pain, SOB, dysuria, abnormal vaginal bleeding/discharge.      Past Medical History  Diagnosis Date  . Diabetes mellitus type II, uncontrolled   . Hypertension   . Hyperlipidemia   . Ovarian cyst, left   . Anemia     due to menorrhagia, BL 8-10  . Vaginal cyst     nabothian and bartholin  . CKD (chronic kidney disease) stage 3, GFR 30-59 ml/min     baseline creatinine 1.4-1.7  . Anxiety   . Depression   . Postmenopausal bleeding 06/12/2008  . 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 UTI (lower urinary tract infection)   . IBS (irritable bowel syndrome)   . Chronic back pain   . Chronic abdominal pain   . DDD (degenerative disc disease), lumbar   . Bilateral renal cysts 01/15/2009    Qualifier: Diagnosis of  By: Tyrell Antonio MD, Belk[place]    . Diabetes mellitus without complication   . History of palpitations     evaluated recently 12'15   Past Surgical History  Procedure Laterality Date  . Cardiac surgery      to repair congenital defect as a child- 56months old   Family History  Problem Relation Age of Onset  . Stroke Father   . Heart  attack Father     Had MI in his 42s  . Stomach cancer Paternal Grandmother   . Diabetes Maternal Grandmother   . Cerebral palsy Daughter   . Anesthesia problems Neg Hx   . Hypotension Neg Hx   . Malignant hyperthermia Neg Hx   . Pseudochol deficiency Neg Hx    History  Substance Use Topics  . Smoking status: Never Smoker   . Smokeless tobacco: Never Used  . Alcohol Use: No   OB History    Gravida Para Term Preterm AB TAB SAB Ectopic Multiple Living   3 1 1  2  2   1      Review of Systems  Constitutional: Negative for fever, chills and fatigue.  HENT: Negative for trouble swallowing.   Eyes: Negative for visual disturbance.  Respiratory: Negative for shortness of breath.   Cardiovascular: Negative for chest pain and palpitations.  Gastrointestinal: Positive for nausea, vomiting, abdominal pain and constipation. Negative for diarrhea.  Genitourinary: Negative for dysuria and difficulty urinating.  Musculoskeletal: Negative for arthralgias and neck pain.  Skin: Negative for color change.  Neurological: Negative for dizziness and weakness.  Psychiatric/Behavioral: Negative for dysphoric mood.      Allergies  Review of patient's allergies indicates no known allergies.  Home Medications   Prior to Admission medications   Medication Sig Start Date End Date Taking? Authorizing Provider  acetaminophen (TYLENOL) 325  MG tablet Take 2 tablets (650 mg total) by mouth every 6 (six) hours as needed for moderate pain. Patient not taking: Reported on 07/29/2014 04/29/14   [name], MD  acetaminophen (TYLENOL) 500 MG tablet Take 500-1,000 mg by mouth every 6 (six) hours as needed for moderate pain.    Historical Provider, MD  cephALEXin (KEFLEX) 500 MG capsule Take 1 capsule (500 mg total) by mouth 3 (three) times daily. 07/29/14   Virgel Manifold, MD  cyclobenzaprine (FLEXERIL) 5 MG tablet Take 1 tablet (5 mg total) by mouth at bedtime as needed for muscle spasms. 08/01/14   Lucious Groves, DO  enalapril (VASOTEC) 10 MG tablet Take 1 tablet (10 mg total) by mouth daily. 06/08/14   Juliet Rude, MD  Insulin Glargine (LANTUS SOLOSTAR) 100 UNIT/ML Solostar Pen Inject 22 Units into the skin daily at 10 pm. 06/10/14   Juliet Rude, MD  oxyCODONE-acetaminophen (PERCOCET/ROXICET) 5-325 MG per tablet Take 1 tablet by mouth every 4 (four) hours as needed for severe pain. 07/29/14   Virgel Manifold, MD  pioglitazone (ACTOS) 15 MG tablet Take 1 tablet (15 mg total) by mouth daily. Patient not taking: Reported on 07/29/2014 07/11/14   Milagros Loll, MD  simvastatin (ZOCOR) 40 MG tablet TAKE 1 TABLET (40 MG TOTAL) BY MOUTH AT BEDTIME. Patient not taking: Reported on 07/29/2014 11/26/13   Ejiroghene Arlyce Dice, MD  traMADol (ULTRAM) 50 MG tablet Take 1 tablet (50 mg total) by mouth every 6 (six) hours as needed. Patient not taking: Reported on 07/29/2014 07/28/14   [name], MD   BP 146/67 mmHg  Pulse 97  Temp(Src) 98.6 F (37 C)  Resp 20  Ht 5\' 6"  (1.676 m)  Wt 166 lb (75.297 kg)  BMI 26.81 kg/m2  SpO2 93% Physical Exam  Constitutional: She is oriented to person, place, and time. She appears well-developed and well-nourished. No distress.  HENT:  Head: Normocephalic and atraumatic.  Eyes: Conjunctivae and EOM are normal.  Neck: Normal range of motion.  Cardiovascular: Normal rate and regular rhythm.  Exam reveals no gallop and no friction rub.   No murmur heard. Pulmonary/Chest: Effort normal and breath sounds normal. She has no wheezes. She has no rales. She exhibits no tenderness.  Abdominal: Soft. She exhibits no distension. There is tenderness. There is no rebound.  Generalized lower abdominal tenderness to palpation.   Musculoskeletal: Normal range of motion.  Neurological: She is alert and oriented to person, place, and time. Coordination normal.  Speech is goal-oriented. Moves limbs without ataxia.   Skin: Skin is warm and dry.  Psychiatric: She has a normal mood and  affect. Her behavior is normal.  Nursing note and vitals reviewed.   ED Course  Procedures (including critical care time) Labs Review Labs Reviewed  CBC WITH DIFFERENTIAL/PLATELET - Abnormal; Notable for the following:    WBC 24.1 (*)    Neutrophils Relative % 92 (*)    Neutro Abs 22.2 (*)    Lymphocytes Relative 6 (*)    Monocytes Relative 2 (*)    All other components within normal limits  BASIC METABOLIC PANEL - Abnormal; Notable for the following:    Sodium 130 (*)    Chloride 90 (*)    Glucose, Bld 340 (*)    BUN 35 (*)    Creatinine, Ser 1.68 (*)    GFR calc non Af Amer 33 (*)    GFR calc Af Amer 38 (*)  All other components within normal limits    Imaging Review Dg Abd Acute W/chest  08/02/2014   CLINICAL DATA:  Abdominal pain and constipation. History of diabetes, hypertension.  EXAM: DG ABDOMEN ACUTE W/ 1V CHEST  COMPARISON:  Chest radiograph February 02, 2014  FINDINGS: Cardiomediastinal silhouette is unremarkable. Bibasilar strandy densities. Lungs are otherwise clear, no pleural effusions. No pneumothorax. Soft tissue planes and included osseous structures are unremarkable.  Bowel gas pattern is nondilated and nonobstructive. Paucity of bowel gas, air-fluid levels in the RIGHT abdomen. Moderate amount of retained large bowel stool. Linear gas-filled structure in the RIGHT lower quadrant likely represents the appendix. No intra-abdominal mass effect, pathologic calcifications or free air. Soft tissue planes and included osseous structures are nonsuspicious.  IMPRESSION: Bibasilar atelectasis.  Air-fluid levels in the RIGHT abdomen could represent early/focal ileus or possibly enteritis with moderate amount of retained large bowel stool, no bowel obstruction.   Electronically Signed   By: Elon Alas M.D.   On: 08/02/2014 03:22     EKG Interpretation None      MDM   Final diagnoses:  Constipation  Colitis    5:17 AM Patient's labs show elevated WBC at 24  without other acute changes. Patient's plain film shows possible ileus. Patient will have CT abdomen pelvis. Vitals stable and patient afebrile. Patient given morphine and zofran here.   CT abdomen pelvis shows colitis without other acute changes. Colitis is likely viral. Patient pending urinalysis. Patient will be discharged home with bentyl and zofran for symptoms. Patient signed out to [facility], PA-C pending urinalysis.    Alvina Chou, PA-C 08/02/14 2025  Everlene Balls, MD 08/02/14 2258

## 2014-08-02 NOTE — ED Notes (Signed)
Pt left with all belongings and refused wheelchair. 

## 2014-08-02 NOTE — Discharge Instructions (Signed)
You were evaluated in the ED today for your nausea and constipation. You were found to have colitis. Please take your antibiotic as directed. You may take your nausea medicine as needed, may take your Bentyl as needed for abdominal cramping. Please follow-up with your primary care for further evaluation and management of your symptoms. Return to ED for new or worsening symptoms.  Colitis Colitis is inflammation of the colon. Colitis can be a short-term or long-standing (chronic) illness. Crohn's disease and ulcerative colitis are 2 types of colitis which are chronic. They usually require lifelong treatment. CAUSES  There are many different causes of colitis, including:  Viruses.  Germs (bacteria).  Medicine reactions. SYMPTOMS   Diarrhea.  Intestinal bleeding.  Pain.  Fever.  Throwing up (vomiting).  Tiredness (fatigue).  Weight loss.  Bowel blockage. DIAGNOSIS  The diagnosis of colitis is based on examination and stool or blood tests. X-rays, CT scan, and colonoscopy may also be needed. TREATMENT  Treatment may include:  Fluids given through the vein (intravenously).  Bowel rest (nothing to eat or drink for a period of time).  Medicine for pain and diarrhea.  Medicines (antibiotics) that kill germs.  Cortisone medicines.  Surgery. HOME CARE INSTRUCTIONS   Get plenty of rest.  Drink enough water and fluids to keep your urine clear or pale yellow.  Eat a well-balanced diet.  Call your caregiver for follow-up as recommended. SEEK IMMEDIATE MEDICAL CARE IF:   You develop chills.  You have an oral temperature above 102 F (38.9 C), not controlled by medicine.  You have extreme weakness, fainting, or dehydration.  You have repeated vomiting.  You develop severe belly (abdominal) pain or are passing bloody or tarry stools. MAKE SURE YOU:   Understand these instructions.  Will watch your condition.  Will get help right away if you are not doing well or  get worse. Document Released: 03/03/2004 Document Revised: 04/18/2011 Document Reviewed: 05/29/2009 [facility] Patient Information 2015 [place], [place]. This information is not intended to replace advice given to you by your health care provider. Make sure you discuss any questions you have with your health care provider.

## 2014-08-02 NOTE — Telephone Encounter (Signed)
Patient calling asking about her labs results from her ED visit for colitis. States she is doing well at this time, no emergent health needs at this time. Will send message to the [facility] clinic to have patient seen for follow up this coming week.  Natasha Bence, MD PGY-2, Internal Medicine Pager: [phone]

## 2014-08-02 NOTE — ED Notes (Signed)
Pt states that she has been constipated for the past three days. Pt stated that she took home medication with no relief. Today patient stated that she has become nauseated and has vomitted several times. EMS gave Zofran on scene. Pt stated that gave her some relief.

## 2014-08-02 NOTE — ED Provider Notes (Signed)
Patient care signed out to me by Helaine Chess, PA-C. Plan is for follow-up on urinalysis. If evidence of UTI treat with antibiotic. Patient may be discharged with anti-spasmodics and antiemetics.  Patient refuses urinalysis today. Upon chart review, patient with known UTI on 6/21, currently taking Keflex for this problem. CT abdomen pelvis shows evidence of colitis with possible infectious source. Also leukocytosis of 24k. We will initiate Cipro for infectious colitis. Meds given in ED:  Medications  sodium chloride 0.9 % bolus 1,000 mL (0 mLs Intravenous Stopped 08/02/14 0430)  ondansetron (ZOFRAN) injection 4 mg (4 mg Intravenous Given 08/02/14 0315)  sodium chloride 0.9 % bolus 1,000 mL (0 mLs Intravenous Stopped 08/02/14 0554)  morphine 4 MG/ML injection 4 mg (4 mg Intravenous Given 08/02/14 0428)  ondansetron (ZOFRAN) injection 4 mg (4 mg Intravenous Given 08/02/14 0428)  iohexol (OMNIPAQUE) 300 MG/ML solution 25 mL (25 mLs Oral Contrast Given 08/02/14 0439)  iohexol (OMNIPAQUE) 300 MG/ML solution 80 mL (80 mLs Intravenous Contrast Given 08/02/14 0517)    New Prescriptions   CIPROFLOXACIN (CIPRO) 500 MG TABLET    Take 1 tablet (500 mg total) by mouth 2 (two) times daily.   DICYCLOMINE (BENTYL) 20 MG TABLET    Take 1 tablet (20 mg total) by mouth 2 (two) times daily.   ONDANSETRON (ZOFRAN ODT) 4 MG DISINTEGRATING TABLET    4mg  ODT q4 hours prn nausea/vomit    Filed Vitals:   08/02/14 0230 08/02/14 0546 08/02/14 0600  BP: 146/67 137/46 129/65  Pulse: 97 118 111  Temp: 98.6 F (37 C)    Resp: 20    Height: 5\' 6"  (1.676 m)    Weight: 166 lb (75.297 kg)    SpO2: 93% 98% 93%   Patient stable, in good condition and is appropriate for discharge. Ambulates out of the ED without difficulty. Prior to patient discharge, I discussed and reviewed this case with Dr. Richardson Chiquito, PA-C 08/02/14 5573  Everlene Balls, MD 08/02/14 2202

## 2014-08-03 ENCOUNTER — Telehealth (HOSPITAL_COMMUNITY): Payer: Self-pay

## 2014-08-03 NOTE — Telephone Encounter (Signed)
Post ED Visit - Positive Culture Follow-up  Culture report reviewed by antimicrobial stewardship pharmacist: []  Wes Fairfield, Pharm.D., BCPS []  Heide Guile, Pharm.D., BCPS []  Alycia Rossetti, Pharm.D., BCPS []  [place], [place].D., BCPS, AAHIVP []  Legrand Como, Pharm.D., BCPS, AAHIVP []  Isac Sarna, Pharm.D., BCPS X  Elenor Quinones, Pharm D  Positive Urine culture, >/= 90,000 colonies -> E Coli Treated with Cephalexin, organism sensitive to the same and no further patient follow-up is required at this time.  Dortha Kern 08/03/2014, 5:12 AM

## 2014-08-04 NOTE — Telephone Encounter (Signed)
Pt states she is calling gayle back

## 2014-08-04 NOTE — ED Provider Notes (Signed)
CSN: [id number]     Arrival date & time 07/29/14  0805 History   First MD Initiated Contact with Patient 07/29/14 0815     Chief Complaint  Patient presents with  . Shoulder Pain  . Dysuria     (Consider location/radiation/quality/duration/timing/severity/associated sxs/prior Treatment) HPI   59yF with R shoulder pain. Onset about 3 days ago. No trauma. Seen in UC, but prescribed therapies not helping. Pain constant. Doesn't radiate. No numbness or tingling. No exertional. No respiratory complaints. No fever or chills. Also c/o increased urinary frequency and dysuria. Questions wether she may have uti. No abdominal pain.   Past Medical History  Diagnosis Date  . Diabetes mellitus type II, uncontrolled   . Hypertension   . Hyperlipidemia   . Ovarian cyst, left   . Anemia     due to menorrhagia, BL 8-10  . Vaginal cyst     nabothian and bartholin  . CKD (chronic kidney disease) stage 3, GFR 30-59 ml/min     baseline creatinine 1.4-1.7  . Anxiety   . Depression   . Postmenopausal bleeding 06/12/2008  . 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 UTI (lower urinary tract infection)   . IBS (irritable bowel syndrome)   . Chronic back pain   . Chronic abdominal pain   . DDD (degenerative disc disease), lumbar   . Bilateral renal cysts 01/15/2009    Qualifier: Diagnosis of  By: Tyrell Antonio MD, Belk[place]    . Diabetes mellitus without complication   . History of palpitations     evaluated recently 12'15   Past Surgical History  Procedure Laterality Date  . Cardiac surgery      to repair congenital defect as a child- 27months old   Family History  Problem Relation Age of Onset  . Stroke Father   . Heart attack Father     Had MI in his 5s  . Stomach cancer Paternal Grandmother   . Diabetes Maternal Grandmother   . Cerebral palsy Daughter   . Anesthesia problems Neg Hx   . Hypotension Neg Hx   . Malignant hyperthermia Neg Hx   . Pseudochol deficiency Neg Hx      History  Substance Use Topics  . Smoking status: Never Smoker   . Smokeless tobacco: Never Used  . Alcohol Use: No   OB History    Gravida Para Term Preterm AB TAB SAB Ectopic Multiple Living   3 1 1  2  2   1      Review of Systems  All systems reviewed and negative, other than as noted in HPI.   Allergies  Review of patient's allergies indicates no known allergies.  Home Medications   Prior to Admission medications   Medication Sig Start Date End Date Taking? Authorizing Provider  acetaminophen (TYLENOL) 500 MG tablet Take 500-1,000 mg by mouth every 6 (six) hours as needed for moderate pain.   Yes Historical Provider, MD  enalapril (VASOTEC) 10 MG tablet Take 1 tablet (10 mg total) by mouth daily. 06/08/14  Yes Carly Montey Hora, MD  Insulin Glargine (LANTUS SOLOSTAR) 100 UNIT/ML Solostar Pen Inject 22 Units into the skin daily at 10 pm. 06/10/14  Yes Carly J Rivet, MD  cephALEXin (KEFLEX) 500 MG capsule Take 1 capsule (500 mg total) by mouth 3 (three) times daily. 07/29/14   Virgel Manifold, MD  ciprofloxacin (CIPRO) 500 MG tablet Take 1 tablet (500 mg total) by mouth 2 (two) times  daily. 08/02/14   Comer Locket, PA-C  cyclobenzaprine (FLEXERIL) 5 MG tablet Take 1 tablet (5 mg total) by mouth at bedtime as needed for muscle spasms. 08/01/14   Lucious Groves, DO  dicyclomine (BENTYL) 20 MG tablet Take 1 tablet (20 mg total) by mouth 2 (two) times daily. 08/02/14   Comer Locket, PA-C  ondansetron (ZOFRAN ODT) 4 MG disintegrating tablet 4mg  ODT q4 hours prn nausea/vomit 08/02/14   Comer Locket, PA-C  oxyCODONE-acetaminophen (PERCOCET/ROXICET) 5-325 MG per tablet Take 1 tablet by mouth every 4 (four) hours as needed for severe pain. 07/29/14   Virgel Manifold, MD  pioglitazone (ACTOS) 15 MG tablet Take 1 tablet (15 mg total) by mouth daily. Patient not taking: Reported on 07/29/2014 07/11/14   Milagros Loll, MD  simvastatin (ZOCOR) 40 MG tablet TAKE 1 TABLET (40 MG TOTAL) BY MOUTH AT  BEDTIME. Patient not taking: Reported on 07/29/2014 11/26/13   Ejiroghene Arlyce Dice, MD  traMADol (ULTRAM) 50 MG tablet Take 1 tablet (50 mg total) by mouth every 6 (six) hours as needed. Patient not taking: Reported on 07/29/2014 07/28/14   [name], MD   BP 132/58 mmHg  Pulse 78  Temp(Src) 97.8 F (36.6 C) (Oral)  Resp 18  SpO2 98% Physical Exam  Constitutional: She appears well-developed and well-nourished. No distress.  HENT:  Head: Normocephalic and atraumatic.  Eyes: Conjunctivae are normal. Right eye exhibits no discharge. Left eye exhibits no discharge.  Neck: Neck supple.  Cardiovascular: Normal rate, regular rhythm and normal heart sounds.  Exam reveals no gallop and no friction rub.   No murmur heard. Pulmonary/Chest: Effort normal and breath sounds normal. No respiratory distress.  Abdominal: Soft. She exhibits no distension. There is no tenderness.  Musculoskeletal: She exhibits no edema or tenderness.       Arms: Mild TTP in pictured area. No concerning skin changes.   Neurological: She is alert.  Skin: Skin is warm and dry.  Psychiatric: She has a normal mood and affect. Her behavior is normal. Thought content normal.  Nursing note and vitals reviewed.   ED Course  Procedures (including critical care time) Labs Review Labs Reviewed  URINALYSIS, ROUTINE W REFLEX MICROSCOPIC (NOT AT [facility]) - Abnormal; Notable for the following:    APPearance TURBID (*)    Hgb urine dipstick MODERATE (*)    Protein, ur 30 (*)    Nitrite POSITIVE (*)    Leukocytes, UA LARGE (*)    All other components within normal limits  URINE CULTURE  URINE MICROSCOPIC-ADD ON    Imaging Review No results found.     EKG Interpretation None      MDM   Final diagnoses:  UTI (lower urinary tract infection)  Right shoulder pain        Virgel Manifold, MD 08/04/14 [phone]

## 2014-08-05 ENCOUNTER — Ambulatory Visit (INDEPENDENT_AMBULATORY_CARE_PROVIDER_SITE_OTHER): Payer: No Typology Code available for payment source | Admitting: Internal Medicine

## 2014-08-05 ENCOUNTER — Encounter: Payer: Self-pay | Admitting: Internal Medicine

## 2014-08-05 ENCOUNTER — Emergency Department (INDEPENDENT_AMBULATORY_CARE_PROVIDER_SITE_OTHER)
Admission: EM | Admit: 2014-08-05 | Discharge: 2014-08-05 | Disposition: A | Discharge status: Home / Self Care | Payer: No Typology Code available for payment source | Source: Home / Self Care | Attending: Family Medicine | Admitting: Family Medicine

## 2014-08-05 ENCOUNTER — Encounter (HOSPITAL_COMMUNITY): Payer: Self-pay | Admitting: Emergency Medicine

## 2014-08-05 VITALS — BP 143/78 | HR 92 | Temp 98.0°F | Ht 64.0 in | Wt 166.8 lb

## 2014-08-05 DIAGNOSIS — N183 Chronic kidney disease, stage 3 unspecified: Secondary | ICD-10-CM

## 2014-08-05 DIAGNOSIS — E1122 Type 2 diabetes mellitus with diabetic chronic kidney disease: Secondary | ICD-10-CM

## 2014-08-05 DIAGNOSIS — K529 Noninfective gastroenteritis and colitis, unspecified: Secondary | ICD-10-CM

## 2014-08-05 DIAGNOSIS — Z794 Long term (current) use of insulin: Secondary | ICD-10-CM

## 2014-08-05 LAB — CBC
HEMATOCRIT: 31.9 % — AB (ref 36.0–46.0)
HEMOGLOBIN: 11.3 g/dL — AB (ref 12.0–15.0)
MCH: 30.5 pg (ref 26.0–34.0)
MCHC: 35.4 g/dL (ref 30.0–36.0)
MCV: 86 fL (ref 78.0–100.0)
Platelets: 225 10*3/uL (ref 150–400)
RBC: 3.71 MIL/uL — ABNORMAL LOW (ref 3.87–5.11)
RDW: 12 % (ref 11.5–15.5)
WBC: 15.8 10*3/uL — ABNORMAL HIGH (ref 4.0–10.5)

## 2014-08-05 LAB — POCT I-STAT, CHEM 8
BUN: 24 mg/dL — ABNORMAL HIGH (ref 6–20)
CHLORIDE: 91 mmol/L — AB (ref 101–111)
Calcium, Ion: 1.09 mmol/L — ABNORMAL LOW (ref 1.12–1.23)
Creatinine, Ser: 2 mg/dL — ABNORMAL HIGH (ref 0.44–1.00)
Glucose, Bld: 268 mg/dL — ABNORMAL HIGH (ref 65–99)
HCT: 35 % — ABNORMAL LOW (ref 36.0–46.0)
HEMOGLOBIN: 11.9 g/dL — AB (ref 12.0–15.0)
POTASSIUM: 4.2 mmol/L (ref 3.5–5.1)
Sodium: 128 mmol/L — ABNORMAL LOW (ref 135–145)
TCO2: 26 mmol/L (ref 0–100)

## 2014-08-05 LAB — POCT GLYCOSYLATED HEMOGLOBIN (HGB A1C): Hemoglobin A1C: 8.3

## 2014-08-05 LAB — GLUCOSE, CAPILLARY: Glucose-Capillary: 312 mg/dL — ABNORMAL HIGH (ref 65–99)

## 2014-08-05 NOTE — Assessment & Plan Note (Signed)
Lab Results  Component Value Date   HGBA1C 8.3 08/05/2014   HGBA1C 8.7 04/29/2014   HGBA1C 9.1 01/06/2014     Assessment: Diabetes control:  Improved.  Comments: Taking Lantus 22 units. Not using Actos which was started at her last clinic visit. She states her doctor started her on 7.5 mg daily and she cannot spilt the pill in half. This is not indicated in the previous notes, states she was to start 15 mg daily.   Plan: Medications:  continue current medications; patient to resume Actos 15 mg daily.  Instruction/counseling given: reminded to bring medications to each visit Educational resources provided: brochure (denies) Self management tools provided: copy of home glucose meter download, home glucose logbook Other plans: RTC in 2 weeks

## 2014-08-05 NOTE — Discharge Instructions (Signed)
Thank you for coming in today. If your belly pain worsens, or you have high fever, bad vomiting, blood in your stool or black tarry stool go to the Emergency Room.  Call or go to the emergency room if you get worse, have trouble breathing, have chest pains, or palpitations.    Colitis Colitis is inflammation of the colon. Colitis can be a short-term or long-standing (chronic) illness. Crohn's disease and ulcerative colitis are 2 types of colitis which are chronic. They usually require lifelong treatment. CAUSES  There are many different causes of colitis, including:  Viruses.  Germs (bacteria).  Medicine reactions. SYMPTOMS   Diarrhea.  Intestinal bleeding.  Pain.  Fever.  Throwing up (vomiting).  Tiredness (fatigue).  Weight loss.  Bowel blockage. DIAGNOSIS  The diagnosis of colitis is based on examination and stool or blood tests. X-rays, CT scan, and colonoscopy may also be needed. TREATMENT  Treatment may include:  Fluids given through the vein (intravenously).  Bowel rest (nothing to eat or drink for a period of time).  Medicine for pain and diarrhea.  Medicines (antibiotics) that kill germs.  Cortisone medicines.  Surgery. HOME CARE INSTRUCTIONS   Get plenty of rest.  Drink enough water and fluids to keep your urine clear or pale yellow.  Eat a well-balanced diet.  Call your caregiver for follow-up as recommended. SEEK IMMEDIATE MEDICAL CARE IF:   You develop chills.  You have an oral temperature above 102 F (38.9 C), not controlled by medicine.  You have extreme weakness, fainting, or dehydration.  You have repeated vomiting.  You develop severe belly (abdominal) pain or are passing bloody or tarry stools. MAKE SURE YOU:   Understand these instructions.  Will watch your condition.  Will get help right away if you are not doing well or get worse. Document Released: 03/03/2004 Document Revised: 04/18/2011 Document Reviewed:  05/29/2009 [facility] Patient Information 2015 [place], [place]. This information is not intended to replace advice given to you by your health care provider. Make sure you discuss any questions you have with your health care provider.

## 2014-08-05 NOTE — Progress Notes (Signed)
INTERNAL MEDICINE TEACHING ATTENDING ADDENDUM - Marvetta Vohs, MD: I reviewed and discussed at the time of visit with the resident Dr. Jones, the patient's medical history, physical examination, diagnosis and results of pertinent tests and treatment and I agree with the patient's care as documented.  

## 2014-08-05 NOTE — Assessment & Plan Note (Signed)
Seen on CT in ED on 08/02/14. Leukocytosis of 24, started on Cipro. Symptoms completely resolved at clinic visit today. No tenderness on exam.  -Continue course of Cipro. -RTC in 2 weeks  OF NOTE: Patient left clinic visit and went to ED after she was informed that she had a colitis and leukocytosis stating she had abdominal pain. CBC checked in the ED, appropriately decreased to 15. No intervention given at that time.

## 2014-08-05 NOTE — Progress Notes (Signed)
Subjective:   Patient ID: [name] female   DOB: 06-Jan-1956 59 y.o.   MRN: [id number]  HPI: [name] is a 59 y.o. female w/ PMHx of HTN, HLD, CKD stage 3, DM type II, depression, and anxiety, presents to the clinic today for a follow-up visit after being seen in the ED 2 times in the past few days. Patient initially had symptoms of abdominal pain, nausea, and diarrhea, found to have colitis on CT abdomen, given Rx for Cipro. Presented again to the ED on 6/27 for dysuria and right shoulder pain, which has been an ongoing issue, no intervention at taht time. Today, she has no specific complaints. Denies fever, chills, nausea, vomiting, diarrhea, constipation, chest pain, SOB. Still has some mild right shoulder tightness and pain medial to the scapula, however, NO limitation to her ROM.   OF NOTE: SHORTLY AFTER THIS VISIT, PATIENT RETURNED TO THE ED FOR ABDOMINAL PAIN, ALTHOUGH STATED IT HAS IMPROVED, WORRIED THAT HER WHITE COUNT IS ELEVATED. Colitis being treated w/ Cipro, no clear indication to repeat labs during this visit. Repeated CBC in ED, showed WBC's to have decreased appropriately.  Current Outpatient Prescriptions  Medication Sig Dispense Refill  . acetaminophen (TYLENOL) 500 MG tablet Take 500-1,000 mg by mouth every 6 (six) hours as needed for moderate pain.    . cephALEXin (KEFLEX) 500 MG capsule Take 1 capsule (500 mg total) by mouth 3 (three) times daily. 15 capsule 0  . ciprofloxacin (CIPRO) 500 MG tablet Take 1 tablet (500 mg total) by mouth 2 (two) times daily. 14 tablet 0  . cyclobenzaprine (FLEXERIL) 5 MG tablet Take 1 tablet (5 mg total) by mouth at bedtime as needed for muscle spasms. 30 tablet 0  . dicyclomine (BENTYL) 20 MG tablet Take 1 tablet (20 mg total) by mouth 2 (two) times daily. 20 tablet 0  . enalapril (VASOTEC) 10 MG tablet Take 1 tablet (10 mg total) by mouth daily. 30 tablet 6  . Insulin Glargine (LANTUS SOLOSTAR) 100 UNIT/ML Solostar Pen Inject 22  Units into the skin daily at 10 pm. 15 mL 11  . ondansetron (ZOFRAN ODT) 4 MG disintegrating tablet 4mg  ODT q4 hours prn nausea/vomit 4 tablet 0  . oxyCODONE-acetaminophen (PERCOCET/ROXICET) 5-325 MG per tablet Take 1 tablet by mouth every 4 (four) hours as needed for severe pain. 12 tablet 0  . pioglitazone (ACTOS) 15 MG tablet Take 1 tablet (15 mg total) by mouth daily. (Patient not taking: Reported on 07/29/2014) 30 tablet 0  . simvastatin (ZOCOR) 40 MG tablet TAKE 1 TABLET (40 MG TOTAL) BY MOUTH AT BEDTIME. 30 tablet 5  . traMADol (ULTRAM) 50 MG tablet Take 1 tablet (50 mg total) by mouth every 6 (six) hours as needed. 10 tablet 0  . [DISCONTINUED] pantoprazole (PROTONIX) 20 MG tablet Take 2 tablets (40 mg total) by mouth daily. 30 tablet 1  . [DISCONTINUED] sertraline (ZOLOFT) 100 MG tablet Take 1 tablet (100 mg total) by mouth daily. 30 tablet 2   No current facility-administered medications for this visit.   Review of Systems  General: Denies fever, diaphoresis, appetite change, and fatigue.  Respiratory: Denies SOB, cough, and wheezing.   Cardiovascular: Denies chest pain and palpitations.  Gastrointestinal: Denies nausea, vomiting, abdominal pain, and diarrhea Musculoskeletal: Positive for mild right shoulder/muscle pain. Denies arthralgias, back pain, and gait problem.  Neurological: Denies dizziness, syncope, weakness, lightheadedness, and headaches.  Psychiatric/Behavioral: Denies mood changes, sleep disturbance, and agitation.   Objective:   Physical  Exam: Filed Vitals:   08/05/14 0904  BP: 143/78  Pulse: 92  Temp: 98 F (36.7 C)  TempSrc: Oral  Height: 5\' 4"  (1.626 m)  Weight: 166 lb 12.8 oz (75.66 kg)  SpO2: 100%    General: White female, alert, cooperative, NAD. HEENT: PERRL, EOMI. Moist mucus membranes Neck: Full range of motion without pain, supple, no lymphadenopathy or carotid bruits Lungs: Clear to ascultation bilaterally, normal work of respiration, no  wheezes, rales, rhonchi Heart: RRR, no murmurs, gallops, or rubs Abdomen: Soft, non-tender, non-distended, BS + Extremities: No cyanosis, clubbing, or edema Neurologic: Alert & oriented X3, cranial nerves II-XII intact, strength grossly intact, sensation intact to light touch   Assessment & Plan:   Please see problem based assessment and plan.

## 2014-08-05 NOTE — ED Notes (Signed)
Here for a f/u and to have WBC checked Reports she was at [facility] ER on 6/25 and dx w/Collitis and was seen today by PCP... Given Cirpo Alert, no signs of acute distress.

## 2014-08-05 NOTE — Patient Instructions (Signed)
1. Please make a follow up appointment for 2 weeks.   2. Please take all medications as previously prescribed with the following changes:  Start taking Actos 15 mg daily. Keep Lantus dose the same.   Continue your course of Cipro.   Use heating pad on your shoulder.   3. If you have worsening of your symptoms or new symptoms arise, please call the clinic ([phone]), or go to the ER immediately if symptoms are severe.

## 2014-08-05 NOTE — ED Provider Notes (Signed)
[name] is a 59 y.o. female who presents to Urgent Care today for abdominal pain. Patient came to clinic today after leaving her primary care provider office. She seen at [facility] emergency room 3 days ago was diagnosed with colitis. She was treated with Cipro. She notes that she is improving somewhat. She is here today because she wants a CBC.  She found out today that her white blood cell count was 24,000 on the 25th. She is concerned that it is still elevated. She notes her abdominal discomfort has improved slightly and she is no longer vomiting or having diarrhea. No fevers or chills.   Past Medical History  Diagnosis Date  . Diabetes mellitus type II, uncontrolled   . Hypertension   . Hyperlipidemia   . Ovarian cyst, left   . Anemia     due to menorrhagia, BL 8-10  . Vaginal cyst     nabothian and bartholin  . CKD (chronic kidney disease) stage 3, GFR 30-59 ml/min     baseline creatinine 1.4-1.7  . Anxiety   . Depression   . Postmenopausal bleeding 06/12/2008  . 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 UTI (lower urinary tract infection)   . IBS (irritable bowel syndrome)   . Chronic back pain   . Chronic abdominal pain   . DDD (degenerative disc disease), lumbar   . Bilateral renal cysts 01/15/2009    Qualifier: Diagnosis of  By: Tyrell Antonio MD, Bel[place]    . Diabetes mellitus without complication   . History of palpitations     evaluated recently 12'15   Past Surgical History  Procedure Laterality Date  . Cardiac surgery      to repair congenital defect as a child- 50months old   History  Substance Use Topics  . Smoking status: Never Smoker   . Smokeless tobacco: Never Used  . Alcohol Use: No   ROS as above Medications: No current facility-administered medications for this encounter.   Current Outpatient Prescriptions  Medication Sig Dispense Refill  . cephALEXin (KEFLEX) 500 MG capsule Take 1 capsule (500 mg total) by mouth 3 (three) times  daily. 15 capsule 0  . ciprofloxacin (CIPRO) 500 MG tablet Take 1 tablet (500 mg total) by mouth 2 (two) times daily. 14 tablet 0  . cyclobenzaprine (FLEXERIL) 5 MG tablet Take 1 tablet (5 mg total) by mouth at bedtime as needed for muscle spasms. 30 tablet 0  . dicyclomine (BENTYL) 20 MG tablet Take 1 tablet (20 mg total) by mouth 2 (two) times daily. 20 tablet 0  . Insulin Glargine (LANTUS SOLOSTAR) 100 UNIT/ML Solostar Pen Inject 22 Units into the skin daily at 10 pm. 15 mL 11  . ondansetron (ZOFRAN ODT) 4 MG disintegrating tablet 4mg  ODT q4 hours prn nausea/vomit 4 tablet 0  . simvastatin (ZOCOR) 40 MG tablet TAKE 1 TABLET (40 MG TOTAL) BY MOUTH AT BEDTIME. 30 tablet 5  . traMADol (ULTRAM) 50 MG tablet Take 1 tablet (50 mg total) by mouth every 6 (six) hours as needed. 10 tablet 0  . acetaminophen (TYLENOL) 500 MG tablet Take 500-1,000 mg by mouth every 6 (six) hours as needed for moderate pain.    Marland Kitchen enalapril (VASOTEC) 10 MG tablet Take 1 tablet (10 mg total) by mouth daily. 30 tablet 6  . oxyCODONE-acetaminophen (PERCOCET/ROXICET) 5-325 MG per tablet Take 1 tablet by mouth every 4 (four) hours as needed for severe pain. 12 tablet 0  .  pioglitazone (ACTOS) 15 MG tablet Take 1 tablet (15 mg total) by mouth daily. (Patient not taking: Reported on 07/29/2014) 30 tablet 0  . [DISCONTINUED] pantoprazole (PROTONIX) 20 MG tablet Take 2 tablets (40 mg total) by mouth daily. 30 tablet 1  . [DISCONTINUED] sertraline (ZOLOFT) 100 MG tablet Take 1 tablet (100 mg total) by mouth daily. 30 tablet 2   No Known Allergies   Exam:  BP 136/73 mmHg  Pulse 89  Temp(Src) 98.4 F (36.9 C) (Oral)  Resp 16  SpO2 100% Gen: Well NAD HEENT: EOMI,  MMM Lungs: Normal work of breathing. CTABL Heart: RRR no MRG Abd: NABS, Soft. Nondistended, Nontender no rebound or guarding or masses palpated. Exts: Brisk capillary refill, warm and well perfused.   Results for orders placed or performed during the hospital  encounter of 08/05/14 (from the past 24 hour(s))  CBC     Status: Abnormal   Collection Time: 08/05/14  2:58 PM  Result Value Ref Range   WBC 15.8 (H) 4.0 - 10.5 K/uL   RBC 3.71 (L) 3.87 - 5.11 MIL/uL   Hemoglobin 11.3 (L) 12.0 - 15.0 g/dL   HCT 31.9 (L) 36.0 - 46.0 %   MCV 86.0 78.0 - 100.0 fL   MCH 30.5 26.0 - 34.0 pg   MCHC 35.4 30.0 - 36.0 g/dL   RDW 12.0 11.5 - 15.5 %   Platelets 225 150 - 400 K/uL  I-STAT, chem 8     Status: Abnormal   Collection Time: 08/05/14  3:00 PM  Result Value Ref Range   Sodium 128 (L) 135 - 145 mmol/L   Potassium 4.2 3.5 - 5.1 mmol/L   Chloride 91 (L) 101 - 111 mmol/L   BUN 24 (H) 6 - 20 mg/dL   Creatinine, Ser 2.00 (H) 0.44 - 1.00 mg/dL   Glucose, Bld 268 (H) 65 - 99 mg/dL   Calcium, Ion 1.09 (L) 1.12 - 1.23 mmol/L   TCO2 26 0 - 100 mmol/L   Hemoglobin 11.9 (L) 12.0 - 15.0 g/dL   HCT 35.0 (L) 36.0 - 46.0 %   No results found.  Assessment and Plan: 59 y.o. female with improving colitis with decreasing white blood cell count. Chemistry is not significantly changed. Recommend follow-up with PCP in the next few days for recheck and reevaluation.  Discussed warning signs or symptoms. Please see discharge instructions. Patient expresses understanding.     [name], MD 08/05/14 [phone]

## 2014-08-06 ENCOUNTER — Ambulatory Visit: Payer: No Typology Code available for payment source | Admitting: Internal Medicine

## 2014-08-08 NOTE — Progress Notes (Signed)
Internal Medicine Clinic Attending  Case discussed with Dr. Jones soon after the resident saw the patient.  We reviewed the resident's history and exam and pertinent patient test results.  I agree with the assessment, diagnosis, and plan of care documented in the resident's note. 

## 2014-08-13 ENCOUNTER — Ambulatory Visit (HOSPITAL_COMMUNITY)
Admission: RE | Admit: 2014-08-13 | Payer: No Typology Code available for payment source | Source: Ambulatory Visit | Admitting: Gastroenterology

## 2014-08-13 ENCOUNTER — Encounter (HOSPITAL_COMMUNITY): Admission: RE | Payer: Self-pay | Source: Ambulatory Visit

## 2014-08-13 SURGERY — COLONOSCOPY WITH PROPOFOL
Anesthesia: Monitor Anesthesia Care

## 2014-08-16 ENCOUNTER — Telehealth: Payer: Self-pay | Admitting: Internal Medicine

## 2014-08-16 NOTE — Telephone Encounter (Signed)
Called after hours pager asking for a refill on Fentanyl for abdominal pain. States the pain is not that significant, no nausea, vomiting, fever, or chills. Informed her that this medication cannot be refilled over the phone. Also informed her that if the pain is severe she should return to the ED if it is intolerable. Also reiterated that the after hours pager is for emergencies.   Natasha Bence, MD PGY-3, Internal Medicine Pager: [phone]

## 2014-08-18 ENCOUNTER — Encounter (HOSPITAL_COMMUNITY): Payer: Self-pay

## 2014-08-18 ENCOUNTER — Emergency Department (HOSPITAL_COMMUNITY): Payer: No Typology Code available for payment source

## 2014-08-18 ENCOUNTER — Observation Stay (HOSPITAL_COMMUNITY)
Admission: EM | Admit: 2014-08-18 | Discharge: 2014-08-19 | Disposition: A | Discharge status: Home / Self Care | Payer: Self-pay | Attending: Internal Medicine | Admitting: Internal Medicine

## 2014-08-18 DIAGNOSIS — E860 Dehydration: Secondary | ICD-10-CM

## 2014-08-18 DIAGNOSIS — K589 Irritable bowel syndrome without diarrhea: Secondary | ICD-10-CM | POA: Insufficient documentation

## 2014-08-18 DIAGNOSIS — R109 Unspecified abdominal pain: Secondary | ICD-10-CM

## 2014-08-18 DIAGNOSIS — F411 Generalized anxiety disorder: Secondary | ICD-10-CM | POA: Diagnosis present

## 2014-08-18 DIAGNOSIS — N3941 Urge incontinence: Secondary | ICD-10-CM | POA: Diagnosis present

## 2014-08-18 DIAGNOSIS — I10 Essential (primary) hypertension: Secondary | ICD-10-CM

## 2014-08-18 DIAGNOSIS — Z79899 Other long term (current) drug therapy: Secondary | ICD-10-CM

## 2014-08-18 DIAGNOSIS — N179 Acute kidney failure, unspecified: Secondary | ICD-10-CM | POA: Diagnosis present

## 2014-08-18 DIAGNOSIS — K828 Other specified diseases of gallbladder: Secondary | ICD-10-CM | POA: Insufficient documentation

## 2014-08-18 DIAGNOSIS — N281 Cyst of kidney, acquired: Secondary | ICD-10-CM | POA: Insufficient documentation

## 2014-08-18 DIAGNOSIS — R1031 Right lower quadrant pain: Secondary | ICD-10-CM | POA: Insufficient documentation

## 2014-08-18 DIAGNOSIS — N183 Chronic kidney disease, stage 3 unspecified: Secondary | ICD-10-CM | POA: Diagnosis present

## 2014-08-18 DIAGNOSIS — G8929 Other chronic pain: Secondary | ICD-10-CM | POA: Diagnosis present

## 2014-08-18 DIAGNOSIS — K429 Umbilical hernia without obstruction or gangrene: Secondary | ICD-10-CM | POA: Insufficient documentation

## 2014-08-18 DIAGNOSIS — K5900 Constipation, unspecified: Secondary | ICD-10-CM | POA: Insufficient documentation

## 2014-08-18 DIAGNOSIS — I129 Hypertensive chronic kidney disease with stage 1 through stage 4 chronic kidney disease, or unspecified chronic kidney disease: Secondary | ICD-10-CM | POA: Insufficient documentation

## 2014-08-18 DIAGNOSIS — Z794 Long term (current) use of insulin: Secondary | ICD-10-CM | POA: Insufficient documentation

## 2014-08-18 DIAGNOSIS — E1159 Type 2 diabetes mellitus with other circulatory complications: Secondary | ICD-10-CM | POA: Diagnosis present

## 2014-08-18 DIAGNOSIS — E1122 Type 2 diabetes mellitus with diabetic chronic kidney disease: Principal | ICD-10-CM | POA: Diagnosis present

## 2014-08-18 DIAGNOSIS — E1169 Type 2 diabetes mellitus with other specified complication: Secondary | ICD-10-CM | POA: Diagnosis present

## 2014-08-18 DIAGNOSIS — E785 Hyperlipidemia, unspecified: Secondary | ICD-10-CM | POA: Insufficient documentation

## 2014-08-18 DIAGNOSIS — F329 Major depressive disorder, single episode, unspecified: Secondary | ICD-10-CM | POA: Insufficient documentation

## 2014-08-18 DIAGNOSIS — N319 Neuromuscular dysfunction of bladder, unspecified: Secondary | ICD-10-CM | POA: Insufficient documentation

## 2014-08-18 DIAGNOSIS — F418 Other specified anxiety disorders: Secondary | ICD-10-CM

## 2014-08-18 DIAGNOSIS — F419 Anxiety disorder, unspecified: Secondary | ICD-10-CM | POA: Insufficient documentation

## 2014-08-18 DIAGNOSIS — R61 Generalized hyperhidrosis: Secondary | ICD-10-CM | POA: Insufficient documentation

## 2014-08-18 DIAGNOSIS — R1030 Lower abdominal pain, unspecified: Secondary | ICD-10-CM | POA: Diagnosis present

## 2014-08-18 HISTORY — DX: Noninfective gastroenteritis and colitis, unspecified: K52.9

## 2014-08-18 LAB — CBC WITH DIFFERENTIAL/PLATELET
BASOS PCT: 0 % (ref 0–1)
Basophils Absolute: 0 10*3/uL (ref 0.0–0.1)
EOS PCT: 1 % (ref 0–5)
Eosinophils Absolute: 0.1 10*3/uL (ref 0.0–0.7)
HCT: 34 % — ABNORMAL LOW (ref 36.0–46.0)
Hemoglobin: 11.5 g/dL — ABNORMAL LOW (ref 12.0–15.0)
Lymphocytes Relative: 41 % (ref 12–46)
Lymphs Abs: 2.7 10*3/uL (ref 0.7–4.0)
MCH: 30.1 pg (ref 26.0–34.0)
MCHC: 33.8 g/dL (ref 30.0–36.0)
MCV: 89 fL (ref 78.0–100.0)
MONO ABS: 0.4 10*3/uL (ref 0.1–1.0)
Monocytes Relative: 7 % (ref 3–12)
NEUTROS PCT: 51 % (ref 43–77)
Neutro Abs: 3.4 10*3/uL (ref 1.7–7.7)
PLATELETS: 249 10*3/uL (ref 150–400)
RBC: 3.82 MIL/uL — ABNORMAL LOW (ref 3.87–5.11)
RDW: 12.4 % (ref 11.5–15.5)
WBC: 6.6 10*3/uL (ref 4.0–10.5)

## 2014-08-18 LAB — I-STAT CG4 LACTIC ACID, ED: Lactic Acid, Venous: 0.79 mmol/L (ref 0.5–2.0)

## 2014-08-18 LAB — COMPREHENSIVE METABOLIC PANEL
ALT: 13 U/L — ABNORMAL LOW (ref 14–54)
AST: 19 U/L (ref 15–41)
Albumin: 3.5 g/dL (ref 3.5–5.0)
Alkaline Phosphatase: 93 U/L (ref 38–126)
Anion gap: 8 (ref 5–15)
BILIRUBIN TOTAL: 0.7 mg/dL (ref 0.3–1.2)
BUN: 38 mg/dL — ABNORMAL HIGH (ref 6–20)
CALCIUM: 8.7 mg/dL — AB (ref 8.9–10.3)
CO2: 28 mmol/L (ref 22–32)
CREATININE: 2.42 mg/dL — AB (ref 0.44–1.00)
Chloride: 95 mmol/L — ABNORMAL LOW (ref 101–111)
GFR calc Af Amer: 24 mL/min — ABNORMAL LOW (ref >60)
GFR, EST NON AFRICAN AMERICAN: 21 mL/min — AB (ref >60)
GLUCOSE: 201 mg/dL — AB (ref 65–99)
Potassium: 3.9 mmol/L (ref 3.5–5.1)
Sodium: 131 mmol/L — ABNORMAL LOW (ref 135–145)
Total Protein: 6.5 g/dL (ref 6.5–8.1)

## 2014-08-18 LAB — OSMOLALITY, URINE: OSMOLALITY UR: 285 mosm/kg — AB (ref 390–1090)

## 2014-08-18 LAB — GLUCOSE, CAPILLARY
GLUCOSE-CAPILLARY: 151 mg/dL — AB (ref 65–99)
GLUCOSE-CAPILLARY: 61 mg/dL — AB (ref 65–99)
Glucose-Capillary: 91 mg/dL (ref 65–99)
Glucose-Capillary: 219 mg/dL — ABNORMAL HIGH (ref 65–99)

## 2014-08-18 LAB — BASIC METABOLIC PANEL
Anion gap: 6 (ref 5–15)
BUN: 37 mg/dL — ABNORMAL HIGH (ref 6–20)
CALCIUM: 7.9 mg/dL — AB (ref 8.9–10.3)
CO2: 25 mmol/L (ref 22–32)
Chloride: 102 mmol/L (ref 101–111)
Creatinine, Ser: 2.1 mg/dL — ABNORMAL HIGH (ref 0.44–1.00)
GFR calc non Af Amer: 25 mL/min — ABNORMAL LOW (ref >60)
GFR, EST AFRICAN AMERICAN: 29 mL/min — AB (ref >60)
GLUCOSE: 150 mg/dL — AB (ref 65–99)
Potassium: 5.3 mmol/L — ABNORMAL HIGH (ref 3.5–5.1)
Sodium: 133 mmol/L — ABNORMAL LOW (ref 135–145)

## 2014-08-18 LAB — URINALYSIS, ROUTINE W REFLEX MICROSCOPIC
Bilirubin Urine: NEGATIVE
Glucose, UA: NEGATIVE mg/dL
Hgb urine dipstick: NEGATIVE
KETONES UR: NEGATIVE mg/dL
NITRITE: NEGATIVE
PH: 6 (ref 5.0–8.0)
Protein, ur: NEGATIVE mg/dL
Specific Gravity, Urine: 1.007 (ref 1.005–1.030)
Urobilinogen, UA: 0.2 mg/dL (ref 0.0–1.0)

## 2014-08-18 LAB — URINE MICROSCOPIC-ADD ON

## 2014-08-18 LAB — LIPASE, BLOOD: Lipase: 17 U/L — ABNORMAL LOW (ref 22–51)

## 2014-08-18 LAB — CBG MONITORING, ED: Glucose-Capillary: 159 mg/dL — ABNORMAL HIGH (ref 65–99)

## 2014-08-18 LAB — SODIUM, URINE, RANDOM: Sodium, Ur: 66 mmol/L

## 2014-08-18 LAB — CREATININE, URINE, RANDOM: CREATININE, URINE: 53.25 mg/dL

## 2014-08-18 MED ORDER — INSULIN ASPART 100 UNIT/ML ~~LOC~~ SOLN
0.0000 [IU] | Freq: Three times a day (TID) | SUBCUTANEOUS | Status: DC
  Administered 2014-08-18: 2 [IU] via SUBCUTANEOUS
  Administered 2014-08-19: 1 [IU] via SUBCUTANEOUS

## 2014-08-18 MED ORDER — KETOROLAC TROMETHAMINE 60 MG/2ML IM SOLN: 60.0000 mg | Freq: Once | INTRAMUSCULAR | Status: DC

## 2014-08-18 MED ORDER — SODIUM CHLORIDE 0.9 % IV BOLUS (SEPSIS)
1000.0000 mL | Freq: Once | INTRAVENOUS | Status: AC
  Administered 2014-08-18: 1000 mL via INTRAVENOUS

## 2014-08-18 MED ORDER — TRAMADOL HCL 50 MG PO TABS: 50.0000 mg | ORAL_TABLET | Freq: Four times a day (QID) | ORAL | Status: DC | PRN

## 2014-08-18 MED ORDER — ACETAMINOPHEN 500 MG PO TABS: 500.0000 mg | ORAL_TABLET | Freq: Four times a day (QID) | ORAL | Status: DC | PRN

## 2014-08-18 MED ORDER — SIMVASTATIN 40 MG PO TABS
40.0000 mg | ORAL_TABLET | Freq: Every day | ORAL | Status: DC
  Administered 2014-08-18: 40 mg via ORAL
  Filled 2014-08-18: qty 1

## 2014-08-18 MED ORDER — CYCLOBENZAPRINE HCL 5 MG PO TABS: 5.0000 mg | ORAL_TABLET | Freq: Every evening | ORAL | Status: DC | PRN

## 2014-08-18 MED ORDER — ONDANSETRON 4 MG PO TBDP
4.0000 mg | ORAL_TABLET | Freq: Once | ORAL | Status: AC
  Administered 2014-08-18: 4 mg via ORAL
  Filled 2014-08-18: qty 1

## 2014-08-18 MED ORDER — HEPARIN SODIUM (PORCINE) 5000 UNIT/ML IJ SOLN
5000.0000 [IU] | Freq: Three times a day (TID) | INTRAMUSCULAR | Status: DC
  Filled 2014-08-18: qty 1

## 2014-08-18 MED ORDER — SODIUM CHLORIDE 0.9 % IV SOLN
INTRAVENOUS | Status: DC
  Administered 2014-08-18: 125 mL/h via INTRAVENOUS
  Administered 2014-08-18 (×2): via INTRAVENOUS
  Administered 2014-08-19: 125 mL/h via INTRAVENOUS

## 2014-08-18 MED ORDER — KETOROLAC TROMETHAMINE 30 MG/ML IJ SOLN
30.0000 mg | Freq: Once | INTRAMUSCULAR | Status: AC
  Administered 2014-08-18: 30 mg via INTRAVENOUS
  Filled 2014-08-18: qty 1

## 2014-08-18 MED ORDER — INSULIN GLARGINE 100 UNIT/ML SOLOSTAR PEN: 15.0000 [IU] | PEN_INJECTOR | Freq: Every day | SUBCUTANEOUS | Status: DC

## 2014-08-18 MED ORDER — INSULIN GLARGINE 100 UNIT/ML ~~LOC~~ SOLN
15.0000 [IU] | Freq: Every day | SUBCUTANEOUS | Status: DC
  Administered 2014-08-18: 15 [IU] via SUBCUTANEOUS
  Filled 2014-08-18 (×2): qty 0.15

## 2014-08-18 NOTE — ED Notes (Signed)
Taken to US at this time. 

## 2014-08-18 NOTE — ED Notes (Signed)
Admitting physician at the bedside.

## 2014-08-18 NOTE — H&P (Signed)
Date: 08/18/2014               Patient Name:  [name] MRN: [id number]  DOB: [birth year] Age / Sex: 59 y.o., female   PCP: Juliet Rude, MD         Medical Service: Internal Medicine Teaching Service         Attending Physician: Dr. Oval Linsey, MD    First Contact: Dr. Charlynn Grimes Pager: [phone]  Second Contact: Dr. Denton Brick Pager: [phone]       After Hours (After 5p/  First Contact Pager: [phone]  weekends / holidays): Second Contact Pager: [phone]   Chief Complaint: abdominal pain  History of Present Illness: [name] is a 59 yo female with HTN, HLD, CKD stage 3, DMII, depression, and anxiety, presenting with abdominal pain.  She has had abdominal pain for weeks, for which she has been to the ED multiple times over the last couple weeks.  She was diagnosed with colitis and prescribed Cipro, which she has recently finished.  She now presents again to the ED complaining of mild abdominal pain and a "warm feeling" in her stomach. She rates the pain at 6/10, radiating across her lower abdomen.  She has a history of neurogenic bladder, likely 2/2 poorly controlled diabetes.  She reports decreased urine output during the day, but has to void every 30 minutes at for the past few days.  She endorses normal volume voids each time she goes.  Her A1c is currently 8.4.   In the ED, her creatinine was noted to be 2.42 (baseline 1.5-1.8).  The ED was concerned for AKI and wanted to admit her.  She endorses low PO intake over the last couple days.  PVR was 64 mL.    She denies fever, chills, nausea, vomiting, constipation or diarrhea.  She denies dysuria.  Meds: Current Facility-Administered Medications  Medication Dose Route Frequency Provider Last Rate Last Dose  . 0.9 %  sodium chloride infusion   Intravenous Continuous Dellia Nims, MD 125 mL/hr at 08/18/14 0654    . acetaminophen (TYLENOL) tablet 500-1,000 mg  500-1,000 mg Oral Q6H PRN Tasrif Ahmed, MD      . cyclobenzaprine  (FLEXERIL) tablet 5 mg  5 mg Oral QHS PRN Tasrif Ahmed, MD      . heparin injection 5,000 Units  5,000 Units Subcutaneous 3 times per day Tasrif Ahmed, MD      . insulin aspart (novoLOG) injection 0-9 Units  0-9 Units Subcutaneous TID WC Tasrif Ahmed, MD      . Insulin Glargine (LANTUS) Solostar Pen 15 Units  15 Units Subcutaneous Q2200 Tasrif Ahmed, MD      . simvastatin (ZOCOR) tablet 40 mg  40 mg Oral q1800 Tasrif Ahmed, MD      . traMADol (ULTRAM) tablet 50 mg  50 mg Oral Q6H PRN Dellia Nims, MD       Current Outpatient Prescriptions  Medication Sig Dispense Refill  . acetaminophen (TYLENOL) 500 MG tablet Take 500-1,000 mg by mouth every 6 (six) hours as needed for moderate pain.    . cyclobenzaprine (FLEXERIL) 5 MG tablet Take 1 tablet (5 mg total) by mouth at bedtime as needed for muscle spasms. 30 tablet 0  . enalapril (VASOTEC) 10 MG tablet Take 1 tablet (10 mg total) by mouth daily. 30 tablet 6  . Insulin Glargine (LANTUS SOLOSTAR) 100 UNIT/ML Solostar Pen Inject 22 Units into the skin daily at 10 pm. 15 mL 11  . ondansetron (ZOFRAN  ODT) 4 MG disintegrating tablet 4mg  ODT q4 hours prn nausea/vomit 4 tablet 0  . oxyCODONE-acetaminophen (PERCOCET/ROXICET) 5-325 MG per tablet Take 1 tablet by mouth every 4 (four) hours as needed for severe pain. 12 tablet 0  . pioglitazone (ACTOS) 15 MG tablet Take 1 tablet (15 mg total) by mouth daily. (Patient taking differently: Take 30 mg by mouth daily. ) 30 tablet 0  . simvastatin (ZOCOR) 40 MG tablet TAKE 1 TABLET (40 MG TOTAL) BY MOUTH AT BEDTIME. 30 tablet 5  . traMADol (ULTRAM) 50 MG tablet Take 1 tablet (50 mg total) by mouth every 6 (six) hours as needed. 10 tablet 0  . [DISCONTINUED] pantoprazole (PROTONIX) 20 MG tablet Take 2 tablets (40 mg total) by mouth daily. 30 tablet 1  . [DISCONTINUED] sertraline (ZOLOFT) 100 MG tablet Take 1 tablet (100 mg total) by mouth daily. 30 tablet 2    Allergies: Allergies as of 08/18/2014  . (No Known  Allergies)   Past Medical History  Diagnosis Date  . Diabetes mellitus type II, uncontrolled   . Hypertension   . Hyperlipidemia   . Ovarian cyst, left   . Anemia     due to menorrhagia, BL 8-10  . Vaginal cyst     nabothian and bartholin  . CKD (chronic kidney disease) stage 3, GFR 30-59 ml/min     baseline creatinine 1.4-1.7  . Anxiety   . Depression   . Postmenopausal bleeding 06/12/2008  . 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 UTI (lower urinary tract infection)   . IBS (irritable bowel syndrome)   . Chronic back pain   . Chronic abdominal pain   . DDD (degenerative disc disease), lumbar   . Bilateral renal cysts 01/15/2009    Qualifier: Diagnosis of  By: Tyrell Antonio MD, [place]    . Diabetes mellitus without complication   . History of palpitations     evaluated recently 12'15   Past Surgical History  Procedure Laterality Date  . Cardiac surgery      to repair congenital defect as a child- 75months old   Family History  Problem Relation Age of Onset  . Stroke Father   . Heart attack Father     Had MI in his 32s  . Stomach cancer Paternal Grandmother   . Diabetes Maternal Grandmother   . Cerebral palsy Daughter   . Anesthesia problems Neg Hx   . Hypotension Neg Hx   . Malignant hyperthermia Neg Hx   . Pseudochol deficiency Neg Hx    History   Social History  . Marital Status: Divorced    Spouse Name: N/A  . Number of Children: 1  . Years of Education: 12th grade   Occupational History  . 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 Smoking status: Never Smoker   . Smokeless tobacco: Never Used  . Alcohol Use: No  . Drug Use: No  . Sexual Activity: No   Other Topics Concern  . 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    Review of Systems: Pertinent items are noted in HPI.  Physical Exam: Blood pressure 122/54, pulse 96, temperature 98.4 F (36.9 C),  temperature source Oral, resp. rate 18, height 5\' 6"  (1.676 m), weight 168 lb (76.204 kg), SpO2 96 %.  Physical Exam  Constitutional: She is oriented to person, place, and time. She  appears well-developed and well-nourished. No distress.  HENT:  Head: Normocephalic and atraumatic.  Eyes: EOM are normal.  Neck: Normal range of motion. No tracheal deviation present.  Cardiovascular: Normal rate, regular rhythm and normal heart sounds.   Pulmonary/Chest: Effort normal and breath sounds normal. No respiratory distress. She has no wheezes.  Abdominal: Soft. Bowel sounds are normal. There is no tenderness. There is no rebound and no guarding.  Musculoskeletal:  Trace edema to knee bilaterally.  Neurological: She is alert and oriented to person, place, and time.  Skin: Skin is warm and dry.     Lab results: Basic Metabolic Panel:  Recent Labs  08/18/14 0408  NA 131*  K 3.9  CL 95*  CO2 28  GLUCOSE 201*  BUN 38*  CREATININE 2.42*  CALCIUM 8.7*   Liver Function Tests:  Recent Labs  08/18/14 0408  AST 19  ALT 13*  ALKPHOS 93  BILITOT 0.7  PROT 6.5  ALBUMIN 3.5    Recent Labs  08/18/14 0408  LIPASE 17*   No results for input(s): AMMONIA in the last 72 hours. CBC:  Recent Labs  08/18/14 0408  WBC 6.6  NEUTROABS 3.4  HGB 11.5*  HCT 34.0*  MCV 89.0  PLT 249   Cardiac Enzymes: No results for input(s): CKTOTAL, CKMB, CKMBINDEX, TROPONINI in the last 72 hours. BNP: No results for input(s): PROBNP in the last 72 hours. D-Dimer: No results for input(s): DDIMER in the last 72 hours. CBG: No results for input(s): GLUCAP in the last 72 hours. Hemoglobin A1C: No results for input(s): HGBA1C in the last 72 hours. Fasting Lipid Panel: No results for input(s): CHOL, HDL, LDLCALC, TRIG, CHOLHDL, LDLDIRECT in the last 72 hours. Thyroid Function Tests: No results for input(s): TSH, T4TOTAL, FREET4, T3FREE, THYROIDAB in the last 72 hours. Anemia Panel: No results for  input(s): VITAMINB12, FOLATE, FERRITIN, TIBC, IRON, RETICCTPCT in the last 72 hours. Coagulation: No results for input(s): LABPROT, INR in the last 72 hours. Urine Drug Screen: Drugs of Abuse  No results found for: LABOPIA, COCAINSCRNUR, LABBENZ, AMPHETMU, THCU, LABBARB  Alcohol Level: No results for input(s): ETH in the last 72 hours. Urinalysis:  Recent Labs  08/18/14 0423  COLORURINE YELLOW  LABSPEC 1.007  PHURINE 6.0  GLUCOSEU NEGATIVE  HGBUR NEGATIVE  BILIRUBINUR NEGATIVE  KETONESUR NEGATIVE  PROTEINUR NEGATIVE  UROBILINOGEN 0.2  NITRITE NEGATIVE  LEUKOCYTESUR SMALL*   Misc. Labs:   Imaging results:  US Renal  08/18/2014   CLINICAL DATA:  Acute kidney injury.  EXAM: RENAL / URINARY TRACT ULTRASOUND COMPLETE  COMPARISON:  CT 08/02/2014 and 12/18/2013  FINDINGS: Right Kidney:  Length: 10.2 cm. There is fullness of the right renal pelvis but this is unchanged from the previous renal ultrasound and similar to the recent CT. Again noted is a hypoechoic cyst along the right kidney upper pole measuring up to 1.4 cm.  Left Kidney:  Length: 9.7 cm. The left renal parenchyma is mildly heterogeneous but similar to the previous examination. There are probably multiple small cysts in the left kidney. There is a 1.3 cm anechoic cyst in the lower pole and a 2.1 cm anechoic cyst in the interpolar region. No significant left hydronephrosis.  Bladder:  Appears normal for degree of bladder distention. Urinary bladder is decompressed.  IMPRESSION: Fullness of the right renal pelvis appears chronic. No significant hydronephrosis.  Bilateral renal cysts.   Electronically Signed   By: Markus Daft M.D.   On: 08/18/2014 07:08  Other results: EKG:not performed  Assessment & Plan by Problem: Active Problems:   Diabetes mellitus with stage 3 chronic kidney disease   HLD (hyperlipidemia)   Anxiety state   Essential hypertension   Low back pain   Lower abdominal pain   Urge incontinence   Acute  kidney injury   AKI (acute kidney injury)  [name] is a 59 yo female with HTN, HLD, CKD stage 3, DMII, depression, and anxiety, presenting with abdominal pain.  AKI on CKD3: Baseline creatinine 1.5-1.8. Now 2.4.  It's unclear why the acute elevation.  No evidence of UTI.  Pre-renal (2/2 poor PO intake) vs post-renal (2/2 neurogenic bladder and hydronephrosis) both likely.  PVR low, but her large volume diuresis over the last few days be post-obstructive. - FeNa - Renal US  HTN: On Enalapril.  Will hold given AKI. - Hold Enalapril  DMII: Stable.  A1c 8.4 in June 2016. Home regimen is Lantus 22 units and pioglitazone. Will hold Pioglitazone. - Lantus 15 Units now - SSI  HLD: stable - Simvastatin  Anxiety/Depression: Sertraline 100 mg  Dispo: Disposition is deferred at this time, awaiting improvement of current medical problems.   The patient does have a current PCP (Carly Montey Hora, MD) and does need an [facility] hospital follow-up appointment after discharge.  The patient does not have transportation limitations that hinder transportation to clinic appointments.  Signed: Iline Oven, MD 08/18/2014, 7:46 AM

## 2014-08-18 NOTE — Progress Notes (Signed)
Subjective: Patient is sitting up, resting comfortably in bed. Abdominal pain has resolved. No complaints. Denies any urinary symptoms.   Objective: Vital signs in last 24 hours: Filed Vitals:   08/18/14 0700 08/18/14 0715 08/18/14 0800 08/18/14 0830  BP: 139/67 122/54 132/69 141/63  Pulse: 103 96 94 87  Temp:    98.4 F (36.9 C)  TempSrc:    Oral  Resp:    18  Height:    5\' 6"  (1.676 m)  Weight:    76.204 kg (168 lb)  SpO2: 100% 96% 98% 100%   Weight change:  No intake or output data in the 24 hours ending 08/18/14 1430   Physical Exam GENERAL- alert, co-operative, appears as stated age, not in any distress. CARDIAC- RRR, systolic murmur heard at RUSB, rubs or gallops. RESP- Moving equal volumes of air, and clear to auscultation bilaterally, no wheezes or crackles. ABDOMEN- Soft, nontender, no guarding or rebound, no palpable masses or organomegaly, bowel sounds present. EXTREMITIES- pulse 2+, symmetric, trace pedal edema bilaterally SKIN- Warm, dry, No rash or lesion. PSYCH- Normal mood and affect, appropriate thought content and speech.  Lab Results: Basic Metabolic Panel:  Recent Labs Lab 08/18/14 0408 08/18/14 1150  NA 131* 133*  K 3.9 5.3*  CL 95* 102  CO2 28 25  GLUCOSE 201* 150*  BUN 38* 37*  CREATININE 2.42* 2.10*  CALCIUM 8.7* 7.9*   Liver Function Tests:  Recent Labs Lab 08/18/14 0408  AST 19  ALT 13*  ALKPHOS 93  BILITOT 0.7  PROT 6.5  ALBUMIN 3.5    Recent Labs Lab 08/18/14 0408  LIPASE 17*   CBC:  Recent Labs Lab 08/18/14 0408  WBC 6.6  NEUTROABS 3.4  HGB 11.5*  HCT 34.0*  MCV 89.0  PLT 249   CBG:  Recent Labs Lab 08/18/14 0744 08/18/14 1141  GLUCAP 159* 151*   Urinalysis:  Recent Labs Lab 08/18/14 0423  COLORURINE YELLOW  LABSPEC 1.007  PHURINE 6.0  GLUCOSEU NEGATIVE  HGBUR NEGATIVE  BILIRUBINUR NEGATIVE  KETONESUR NEGATIVE  PROTEINUR NEGATIVE  UROBILINOGEN 0.2  NITRITE NEGATIVE  LEUKOCYTESUR SMALL*     Studies/Results: US Renal  08/18/2014   CLINICAL DATA:  Acute kidney injury.  EXAM: RENAL / URINARY TRACT ULTRASOUND COMPLETE  COMPARISON:  CT 08/02/2014 and 12/18/2013  FINDINGS: Right Kidney:  Length: 10.2 cm. There is fullness of the right renal pelvis but this is unchanged from the previous renal ultrasound and similar to the recent CT. Again noted is a hypoechoic cyst along the right kidney upper pole measuring up to 1.4 cm.  Left Kidney:  Length: 9.7 cm. The left renal parenchyma is mildly heterogeneous but similar to the previous examination. There are probably multiple small cysts in the left kidney. There is a 1.3 cm anechoic cyst in the lower pole and a 2.1 cm anechoic cyst in the interpolar region. No significant left hydronephrosis.  Bladder:  Appears normal for degree of bladder distention. Urinary bladder is decompressed.  IMPRESSION: Fullness of the right renal pelvis appears chronic. No significant hydronephrosis.  Bilateral renal cysts.   Electronically Signed   By: Markus Daft M.D.   On: 08/18/2014 07:08   Medications: I have reviewed the patient's current medications. Scheduled Meds: . heparin  5,000 Units Subcutaneous 3 times per day  . insulin aspart  0-9 Units Subcutaneous TID WC  . insulin glargine  15 Units Subcutaneous QHS  . simvastatin  40 mg Oral q1800   Continuous Infusions: .  sodium chloride 125 mL/hr at 08/18/14 0654   PRN Meds:.acetaminophen, cyclobenzaprine, traMADol Assessment/Plan: Active Problems:   Diabetes mellitus with stage 3 chronic kidney disease   HLD (hyperlipidemia)   Anxiety state   Essential hypertension   Low back pain   Lower abdominal pain   Urge incontinence   Acute kidney injury   AKI (acute kidney injury)  AKI on CKD3: Cr has improved from 2.4 to 2.1 on recheck BMET today. Baseline creatinine 1.5-1.8. Now 2.4. It's unclear why the acute elevation. No evidence of UTI. Pre-renal (2/2 poor PO intake) vs post-renal (2/2 neurogenic bladder  and hydronephrosis) both likely. PVR low, but her large volume diuresis over the last few days be post-obstructive.  - FENa 1.96% - Renal US: No significant hydronephrosis. Chronic appearing fullness of right renal pelvis. - Continue NS 125 mL/hr  - Check BMET in AM  HTN: BP in the 500-938H systolic. On Enalapril. Will hold given AKI.  - Hold Enalapril until Cr improves  DMII: Stable. A1c 8.4 in June 2016. Home regimen is Lantus 22 units and pioglitazone. Will hold Pioglitazone.  - Lantus 15 Units QHS - SSI   HLD: stable  - Continue Simvastatin   Anxiety/Depression: Sertraline 100 mg  Diet: Diabetic Diet  DVT PPX: Heparin SQ  Dispo: Disposition is deferred at this time, awaiting improvement of current medical problems.  Anticipated discharge in approximately 1-2 day(s).   The patient does have a current PCP (Carly Montey Hora, MD) and does need an [facility] hospital follow-up appointment after discharge.  The patient does not have transportation limitations that hinder transportation to clinic appointments.  Maryellen Pile, MD 08/18/2014, 2:30 PM

## 2014-08-18 NOTE — Progress Notes (Signed)
Hypoglycemic Event  CBG: 61  Treatment: 15 GM carbohydrate snack, then dinner.   Symptoms: None  Follow-up CBG: Time:1730 CBG Result:91  Possible Reasons for Event: Inadequate meal intake and Other: pt had been NPO all day and was getting increasingly hungry.   Comments/MD notified: internal medicine MD's notified. No further actions taken. Pt received dinner tray.     Norva Karvonen  Remember to initiate Hypoglycemia Order Set & complete

## 2014-08-18 NOTE — ED Notes (Signed)
Attempted report 

## 2014-08-18 NOTE — ED Notes (Addendum)
CBG = 159  RN [place] informed of result.

## 2014-08-18 NOTE — ED Notes (Signed)
Pt is declining food at current time prior to leaving department, states she is not hungry. Insulin not given due to no meal.

## 2014-08-18 NOTE — ED Notes (Signed)
Pt states she started having abdominal pain but she does have chronic abdominal pain at night; Pt states she tried taking tylenol with no relief. Pt c/o pain 7/10 with a "warm feeling" Pt a&ox 4 on arrival

## 2014-08-18 NOTE — ED Provider Notes (Signed)
CSN: [id number]     Arrival date & time 08/18/14  0344 History   This chart was scribed for Everlene Balls, MD by Forrestine Him, ED Scribe. This patient was seen in room B18C/B18C and the patient's care was started 3:52 AM.   Chief Complaint  Patient presents with  . Abdominal Pain   The history is provided by the patient. No language interpreter was used.    HPI Comments: [name] is a 59 y.o. female with a PMHx of HTN, DM, hyperlipidemia, CKD, IBS, colitis, who presents to the Emergency Department complaining of constant, ongoing lower abdominal pain x 2 weeks. Pain is described as "a warm feeling" and currently rated 7/10. No aggravating or alleviating factors at this time. OTC Tylenol attempted prior to arrival without any improvement for symptoms. Ms. Philipp also reports mild dysuria and nausea. No recent fever, chills, vomiting, chest pain, abdominal pain, or shortness of breath. Pt was evaluated by GI which plan to defer treatment due to colitis as she was recently diagnosed here in the ED 2 weeks ago. No known allergies to medications.  Past Medical History  Diagnosis Date  . Diabetes mellitus type II, uncontrolled   . Hypertension   . Hyperlipidemia   . Ovarian cyst, left   . Anemia     due to menorrhagia, BL 8-10  . Vaginal cyst     nabothian and bartholin  . CKD (chronic kidney disease) stage 3, GFR 30-59 ml/min     baseline creatinine 1.4-1.7  . Anxiety   . Depression   . Postmenopausal bleeding 06/12/2008  . 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 UTI (lower urinary tract infection)   . IBS (irritable bowel syndrome)   . Chronic back pain   . Chronic abdominal pain   . DDD (degenerative disc disease), lumbar   . Bilateral renal cysts 01/15/2009    Qualifier: Diagnosis of  By: Tyrell Antonio MD, [place]    . Diabetes mellitus without complication   . History of palpitations     evaluated recently 12'15   Past Surgical History  Procedure Laterality  Date  . Cardiac surgery      to repair congenital defect as a child- 79months old   Family History  Problem Relation Age of Onset  . Stroke Father   . Heart attack Father     Had MI in his 45s  . Stomach cancer Paternal Grandmother   . Diabetes Maternal Grandmother   . Cerebral palsy Daughter   . Anesthesia problems Neg Hx   . Hypotension Neg Hx   . Malignant hyperthermia Neg Hx   . Pseudochol deficiency Neg Hx    History  Substance Use Topics  . Smoking status: Never Smoker   . Smokeless tobacco: Never Used  . Alcohol Use: No   OB History    Gravida Para Term Preterm AB TAB SAB Ectopic Multiple Living   3 1 1  2  2   1      Review of Systems  Constitutional: Negative for fever and chills.  Respiratory: Negative for cough and shortness of breath.   Gastrointestinal: Positive for nausea and abdominal pain. Negative for vomiting.  Genitourinary: Positive for dysuria.  Neurological: Negative for dizziness, weakness and numbness.  Psychiatric/Behavioral: Negative for confusion.  All other systems reviewed and are negative.     Allergies  Review of patient's allergies indicates no known allergies.  Home Medications   Prior to Admission medications  Medication Sig Start Date End Date Taking? Authorizing Provider  acetaminophen (TYLENOL) 500 MG tablet Take 500-1,000 mg by mouth every 6 (six) hours as needed for moderate pain.    Historical Provider, MD  ciprofloxacin (CIPRO) 500 MG tablet Take 1 tablet (500 mg total) by mouth 2 (two) times daily. 08/02/14   Comer Locket, PA-C  cyclobenzaprine (FLEXERIL) 5 MG tablet Take 1 tablet (5 mg total) by mouth at bedtime as needed for muscle spasms. 08/01/14   Lucious Groves, DO  dicyclomine (BENTYL) 20 MG tablet Take 1 tablet (20 mg total) by mouth 2 (two) times daily. 08/02/14   Comer Locket, PA-C  enalapril (VASOTEC) 10 MG tablet Take 1 tablet (10 mg total) by mouth daily. 06/08/14   Juliet Rude, MD  Insulin Glargine (LANTUS  SOLOSTAR) 100 UNIT/ML Solostar Pen Inject 22 Units into the skin daily at 10 pm. 06/10/14   Juliet Rude, MD  ondansetron (ZOFRAN ODT) 4 MG disintegrating tablet 4mg  ODT q4 hours prn nausea/vomit 08/02/14   Comer Locket, PA-C  oxyCODONE-acetaminophen (PERCOCET/ROXICET) 5-325 MG per tablet Take 1 tablet by mouth every 4 (four) hours as needed for severe pain. 07/29/14   Virgel Manifold, MD  pioglitazone (ACTOS) 15 MG tablet Take 1 tablet (15 mg total) by mouth daily. Patient not taking: Reported on 07/29/2014 07/11/14   Milagros Loll, MD  simvastatin (ZOCOR) 40 MG tablet TAKE 1 TABLET (40 MG TOTAL) BY MOUTH AT BEDTIME. 11/26/13   [name], MD  traMADol (ULTRAM) 50 MG tablet Take 1 tablet (50 mg total) by mouth every 6 (six) hours as needed. 07/28/14   [name], MD   Triage Vitals: BP 158/76 mmHg  Pulse 99  Temp(Src) 98.4 F (36.9 C) (Oral)  Resp 18  Ht 5\' 4"  (1.626 m)  Wt 168 lb (76.204 kg)  BMI 28.82 kg/m2  SpO2 97%   Physical Exam  Constitutional: She is oriented to person, place, and time. She appears well-developed and well-nourished. No distress.  HENT:  Head: Normocephalic and atraumatic.  Nose: Nose normal.  Mouth/Throat: Oropharynx is clear and moist. No oropharyngeal exudate.  Eyes: Conjunctivae and EOM are normal. Pupils are equal, round, and reactive to light. No scleral icterus.  Neck: Normal range of motion. Neck supple. No JVD present. No tracheal deviation present. No thyromegaly present.  Cardiovascular: Normal rate, regular rhythm and normal heart sounds.  Exam reveals no gallop and no friction rub.   No murmur heard. Pulmonary/Chest: Effort normal and breath sounds normal. No respiratory distress. She has no wheezes. She exhibits no tenderness.  Abdominal: Soft. Bowel sounds are normal. She exhibits no distension and no mass. There is no tenderness. There is no rebound and no guarding.  Musculoskeletal: Normal range of motion. She exhibits no edema or  tenderness.  Lymphadenopathy:    She has no cervical adenopathy.  Neurological: She is alert and oriented to person, place, and time. No cranial nerve deficit. She exhibits normal muscle tone.  Skin: Skin is warm and dry. No rash noted. No erythema. No pallor.  Nursing note and vitals reviewed.   ED Course  Procedures (including critical care time)  DIAGNOSTIC STUDIES: Oxygen Saturation is 97% on RA, adequate by my interpretation.    COORDINATION OF CARE: 4:04 AM- Will give Zofran, fluids, and Toradol. Will order i-stat CG4 lactic acid, CMP, Lipase, and urinalysis. Discussed treatment plan with pt at bedside and pt agreed to plan.     Labs Review Labs Reviewed  CBC WITH DIFFERENTIAL/PLATELET - Abnormal; Notable for the following:    RBC 3.82 (*)    Hemoglobin 11.5 (*)    HCT 34.0 (*)    All other components within normal limits  COMPREHENSIVE METABOLIC PANEL - Abnormal; Notable for the following:    Sodium 131 (*)    Chloride 95 (*)    Glucose, Bld 201 (*)    BUN 38 (*)    Creatinine, Ser 2.42 (*)    Calcium 8.7 (*)    ALT 13 (*)    GFR calc non Af Amer 21 (*)    GFR calc Af Amer 24 (*)    All other components within normal limits  LIPASE, BLOOD - Abnormal; Notable for the following:    Lipase 17 (*)    All other components within normal limits  URINALYSIS, ROUTINE W REFLEX MICROSCOPIC (NOT AT [facility]) - Abnormal; Notable for the following:    Leukocytes, UA SMALL (*)    All other components within normal limits  URINE MICROSCOPIC-ADD ON - Abnormal; Notable for the following:    Squamous Epithelial / LPF FEW (*)    All other components within normal limits  OSMOLALITY, URINE  SODIUM, URINE, RANDOM  CREATININE, URINE, RANDOM  I-STAT CG4 LACTIC ACID, ED    Imaging Review No results found.   EKG Interpretation None      MDM   Final diagnoses:  None   Patient presents emergency department for abdominal pain. She's been seen multiple times emergency department  for this, she has been diagnosed with chronic abdominal pain. Today however her creatinine is elevated to 2.4, her baseline appears to be around 1.4. I'm not sure what is causing this. She denies poor appetite or dehydration. She was given IV fluids in the emergency department. Will obtain bladder scan as well. I spoke with the internal medicine teaching service will admit the patient for further diagnostics. Patient was given Toradol for pain prior to seeing creatinine results.  Urinalysis does not show infection. Bladder scan will be obtained for retention.  I personally performed the services described in this documentation, which was scribed in my presence. The recorded information has been reviewed and is accurate.   Everlene Balls, MD 08/18/14 [phone]

## 2014-08-19 DIAGNOSIS — F411 Generalized anxiety disorder: Secondary | ICD-10-CM

## 2014-08-19 DIAGNOSIS — N183 Chronic kidney disease, stage 3 (moderate): Secondary | ICD-10-CM

## 2014-08-19 DIAGNOSIS — E785 Hyperlipidemia, unspecified: Secondary | ICD-10-CM

## 2014-08-19 DIAGNOSIS — N179 Acute kidney failure, unspecified: Secondary | ICD-10-CM

## 2014-08-19 DIAGNOSIS — E1122 Type 2 diabetes mellitus with diabetic chronic kidney disease: Secondary | ICD-10-CM

## 2014-08-19 DIAGNOSIS — R1030 Lower abdominal pain, unspecified: Secondary | ICD-10-CM

## 2014-08-19 DIAGNOSIS — I129 Hypertensive chronic kidney disease with stage 1 through stage 4 chronic kidney disease, or unspecified chronic kidney disease: Secondary | ICD-10-CM

## 2014-08-19 DIAGNOSIS — R32 Unspecified urinary incontinence: Secondary | ICD-10-CM

## 2014-08-19 DIAGNOSIS — M545 Low back pain: Secondary | ICD-10-CM

## 2014-08-19 LAB — BASIC METABOLIC PANEL
Anion gap: 5 (ref 5–15)
BUN: 33 mg/dL — AB (ref 6–20)
CALCIUM: 7.7 mg/dL — AB (ref 8.9–10.3)
CHLORIDE: 106 mmol/L (ref 101–111)
CO2: 24 mmol/L (ref 22–32)
Creatinine, Ser: 1.84 mg/dL — ABNORMAL HIGH (ref 0.44–1.00)
GFR calc non Af Amer: 29 mL/min — ABNORMAL LOW (ref >60)
GFR, EST AFRICAN AMERICAN: 34 mL/min — AB (ref >60)
Glucose, Bld: 135 mg/dL — ABNORMAL HIGH (ref 65–99)
Potassium: 5.3 mmol/L — ABNORMAL HIGH (ref 3.5–5.1)
SODIUM: 135 mmol/L (ref 135–145)

## 2014-08-19 LAB — GLUCOSE, CAPILLARY
GLUCOSE-CAPILLARY: 67 mg/dL (ref 65–99)
GLUCOSE-CAPILLARY: 76 mg/dL (ref 65–99)
Glucose-Capillary: 144 mg/dL — ABNORMAL HIGH (ref 65–99)

## 2014-08-19 MED ORDER — INSULIN GLARGINE 100 UNIT/ML ~~LOC~~ SOLN
10.0000 [IU] | Freq: Every day | SUBCUTANEOUS | Status: DC
  Filled 2014-08-19: qty 0.1

## 2014-08-19 NOTE — Progress Notes (Signed)
Subjective: Patient is resting comfortably in bed. No complaints. Denies any abdominal pain or dysuria.   Objective: Vital signs in last 24 hours: Filed Vitals:   08/18/14 0800 08/18/14 0830 08/18/14 2048 08/19/14 0515  BP: 132/69 141/63 123/60 123/53  Pulse: 94 87 81 72  Temp:  98.4 F (36.9 C) 98.1 F (36.7 C) 98 F (36.7 C)  TempSrc:  Oral Oral Oral  Resp:  18 18 18   Height:  5\' 6"  (1.676 m)    Weight:  76.204 kg (168 lb)    SpO2: 98% 100% 98% 100%   Weight change: 0 kg (0 lb)  Intake/Output Summary (Last 24 hours) at 08/19/14 5809 Last data filed at 08/19/14 0513  Gross per 24 hour  Intake    360 ml  Output    900 ml  Net   -540 ml   Physical Exam GENERAL- alert, co-operative, appears as stated age, not in any distress. CARDIAC- RRR, systolic murmur heard at RUS, rubs or gallops. RESP- Moving equal volumes of air, and clear to auscultation bilaterally, no wheezes or crackles. ABDOMEN- Soft, nontender, no guarding or rebound, no palpable masses or organomegaly, bowel sounds present. EXTREMITIES- pulse 2+, symmetric, trace pedal edema. SKIN- Warm, dry, No rash or lesion. PSYCH- Normal mood and affect, appropriate thought content and speech.  Lab Results: Basic Metabolic Panel:  Recent Labs Lab 08/18/14 1150 08/19/14 0342  NA 133* 135  K 5.3* 5.3*  CL 102 106  CO2 25 24  GLUCOSE 150* 135*  BUN 37* 33*  CREATININE 2.10* 1.84*  CALCIUM 7.9* 7.7*   Liver Function Tests:  Recent Labs Lab 08/18/14 0408  AST 19  ALT 13*  ALKPHOS 93  BILITOT 0.7  PROT 6.5  ALBUMIN 3.5    Recent Labs Lab 08/18/14 0408  LIPASE 17*   CBC:  Recent Labs Lab 08/18/14 0408  WBC 6.6  NEUTROABS 3.4  HGB 11.5*  HCT 34.0*  MCV 89.0  PLT 249   CBG:  Recent Labs Lab 08/18/14 0744 08/18/14 1141 08/18/14 1653 08/18/14 1729 08/18/14 2052  GLUCAP 159* 151* 61* 91 219*   Urinalysis:  Recent Labs Lab 08/18/14 0423  COLORURINE YELLOW  LABSPEC 1.007    PHURINE 6.0  GLUCOSEU NEGATIVE  HGBUR NEGATIVE  BILIRUBINUR NEGATIVE  KETONESUR NEGATIVE  PROTEINUR NEGATIVE  UROBILINOGEN 0.2  NITRITE NEGATIVE  LEUKOCYTESUR SMALL*   Micro Results: No results found for this or any previous visit (from the past 240 hour(s)). Studies/Results: US Renal  08/18/2014   CLINICAL DATA:  Acute kidney injury.  EXAM: RENAL / URINARY TRACT ULTRASOUND COMPLETE  COMPARISON:  CT 08/02/2014 and 12/18/2013  FINDINGS: Right Kidney:  Length: 10.2 cm. There is fullness of the right renal pelvis but this is unchanged from the previous renal ultrasound and similar to the recent CT. Again noted is a hypoechoic cyst along the right kidney upper pole measuring up to 1.4 cm.  Left Kidney:  Length: 9.7 cm. The left renal parenchyma is mildly heterogeneous but similar to the previous examination. There are probably multiple small cysts in the left kidney. There is a 1.3 cm anechoic cyst in the lower pole and a 2.1 cm anechoic cyst in the interpolar region. No significant left hydronephrosis.  Bladder:  Appears normal for degree of bladder distention. Urinary bladder is decompressed.  IMPRESSION: Fullness of the right renal pelvis appears chronic. No significant hydronephrosis.  Bilateral renal cysts.   Electronically Signed   By: Scherrie Gerlach.D.  On: 08/18/2014 07:08   Medications: I have reviewed the patient's current medications. Scheduled Meds: . heparin  5,000 Units Subcutaneous 3 times per day  . insulin aspart  0-9 Units Subcutaneous TID WC  . insulin glargine  15 Units Subcutaneous QHS  . simvastatin  40 mg Oral q1800   Continuous Infusions: . sodium chloride 125 mL/hr (08/19/14 0513)   PRN Meds:.acetaminophen, cyclobenzaprine, traMADol Assessment/Plan: Active Problems:   Diabetes mellitus with stage 3 chronic kidney disease   HLD (hyperlipidemia)   Anxiety state   Essential hypertension   Low back pain   Lower abdominal pain   Urge incontinence   Acute kidney  injury   AKI (acute kidney injury)  AKI on CKD3: Cr has improved from 2.1 to 1.84 on recheck BMET today. Baseline creatinine 1.5-1.8. Now 1.84. It's unclear why the acute elevation. No evidence of UTI. Pre-renal (2/2 poor PO intake) vs post-renal (2/2 neurogenic bladder and hydronephrosis) both likely. PVR low, but her large volume diuresis over the last few days may be post-obstructive.  - FENa 1.96% - Renal US: No significant hydronephrosis. Chronic appearing fullness of right renal pelvis. - Continue NS 125 mL/hr  - Cr improved, likely discharge today  HTN: BP in the 809-983J systolic. On Enalapril.  - Will restart Enalapril since Cr improved  DMII: Stable. A1c 8.4 in June 2016. Home regimen is Lantus 22 units and pioglitazone. Will hold Pioglitazone.  - Lantus 15 Units QHS - SSI   HLD: stable  - Continue Simvastatin   Anxiety/Depression: Sertraline 100 mg  Diet: Diabetic Diet  DVT PPX: Heparin SQ Dispo: Ready for discharge today.   The patient does have a current PCP (Carly Montey Hora, MD) and does need an [facility] hospital follow-up appointment after discharge.  The patient does not have transportation limitations that hinder transportation to clinic appointments.  Maryellen Pile, MD 08/19/2014, 7:12 AM

## 2014-08-19 NOTE — Progress Notes (Signed)
Patient discharged to home with instructions. 

## 2014-08-19 NOTE — Discharge Summary (Signed)
Name: [name] MRN: [id number] DOB: 1955/04/22 59 y.o. PCP: Juliet Rude, MD  Date of Admission: 08/18/2014  3:45 AM Date of Discharge: 08/19/2014 Attending Physician: No att. providers found  Discharge Diagnosis: Active Problems:   Diabetes mellitus with stage 3 chronic kidney disease   HLD (hyperlipidemia)   Anxiety state   Essential hypertension   Low back pain   Lower abdominal pain   Urge incontinence   Acute kidney injury   AKI (acute kidney injury)  Discharge Medications:   Medication List    TAKE these medications        acetaminophen 500 MG tablet  Commonly known as:  TYLENOL  Take 500-1,000 mg by mouth every 6 (six) hours as needed for moderate pain.     cyclobenzaprine 5 MG tablet  Commonly known as:  FLEXERIL  Take 1 tablet (5 mg total) by mouth at bedtime as needed for muscle spasms.     enalapril 10 MG tablet  Commonly known as:  VASOTEC  Take 1 tablet (10 mg total) by mouth daily.     Insulin Glargine 100 UNIT/ML Solostar Pen  Commonly known as:  LANTUS SOLOSTAR  Inject 22 Units into the skin daily at 10 pm.     ondansetron 4 MG disintegrating tablet  Commonly known as:  ZOFRAN ODT  4mg  ODT q4 hours prn nausea/vomit     oxyCODONE-acetaminophen 5-325 MG per tablet  Commonly known as:  PERCOCET/ROXICET  Take 1 tablet by mouth every 4 (four) hours as needed for severe pain.     pioglitazone 15 MG tablet  Commonly known as:  ACTOS  Take 1 tablet (15 mg total) by mouth daily.     simvastatin 40 MG tablet  Commonly known as:  ZOCOR  TAKE 1 TABLET (40 MG TOTAL) BY MOUTH AT BEDTIME.     traMADol 50 MG tablet  Commonly known as:  ULTRAM  Take 1 tablet (50 mg total) by mouth every 6 (six) hours as needed.        Disposition and follow-up:   [name] was discharged from [facility] in Good condition.  At the hospital follow up visit please address:  1.  Assess if she has been drinking adequate amounts of water,  continued abdominal pain.  2.  Labs / imaging needed at time of follow-up: BMET for Cr  3.  Pending labs/ test needing follow-up: Urine Eosinophils  Follow-up Appointments:     Follow-up Information    Follow up with Albin Felling, MD On 08/26/2014.   Specialty:  Internal Medicine   Why:  1:45 PM   Contact information:   [place]  [id number] [phone]       Discharge Instructions: Discharge Instructions    Diet - low sodium heart healthy    Complete by:  As directed      Diet - low sodium heart healthy    Complete by:  As directed      Increase activity slowly    Complete by:  As directed      Increase activity slowly    Complete by:  As directed            Consultations:    Procedures Performed:  Dg Shoulder Right  07/29/2014   CLINICAL DATA:  Pain.  No recent trauma.  EXAM: RIGHT SHOULDER - 2+ VIEW  COMPARISON:  None.  FINDINGS: Frontal, Y scapular, and axillary views were obtained. There is no demonstrable acute  fracture or dislocation. A small calcification noted superior to the lateral clavicle is well corticated and may represent residua of old trauma or localized calcific tendinosis. There is no appreciable joint space narrowing. There is a probable bone island in the mid humerus seen on the axillary image.  IMPRESSION: Question old trauma versus calcific tendinosis just superior to the lateral clavicle. No acute fracture or dislocation. No appreciable joint space narrowing. No erosive change.   Electronically Signed   By: Lowella Grip III M.D.   On: 07/29/2014 09:07   Ct Abdomen Pelvis W Contrast  08/02/2014   CLINICAL DATA:  RIGHT lower quadrant pain, constipation for 3 days with vomiting and diaphoresis. History of diabetes, chronic kidney disease, hypertension, urinary tract infection.  EXAM: CT ABDOMEN AND PELVIS WITH CONTRAST  TECHNIQUE: Multidetector CT imaging of the abdomen and pelvis was performed using the standard protocol following  bolus administration of intravenous contrast.  CONTRAST:  79mL OMNIPAQUE IOHEXOL 300 MG/ML  SOLN  COMPARISON:  Acute abdominal series August 02, 2014 and CT abdomen and pelvis October 19, 2013 and September 19, 2012  FINDINGS: LUNG BASES: Included view of the lung bases demonstrate mild dependent atelectasis. The in the included heart is upper limits of normal in size. No pericardial effusions.  SOLID ORGANS: The liver demonstrates mild biliary dilatation, otherwise unremarkable. Spleen, pancreas and adrenal glands are unremarkable. Mild gallbladder distension peer  GASTROINTESTINAL TRACT: Diffuse colonic wall thickening and pericolonic inflammation with relative sparing of the sigmoid colon. Colonic air-fluid level. The stomach, small bowel are normal in course and caliber without inflammatory changes. Normal appendix. Enteric contrast has not yet reached the distal small bowel.  KIDNEYS/ URINARY TRACT: Kidneys are orthotopic, demonstrating symmetric enhancement. No nephrolithiasis, hydronephrosis. Indeterminate RIGHT upper pole 15 mm and LEFT lower pole 16 mm hypo enhancing masses are relatively stable from 2014. RIGHT interpolar 15 mm cyst with punctate calcification. Too small to characterize hypodensities in kidneys bilaterally in addition to cortical scarring The unopacified ureters are normal in course and caliber. Delayed imaging through the kidneys demonstrates symmetric prompt contrast excretion within the proximal urinary collecting system. Urinary bladder is partially distended and unremarkable.  PERITONEUM/RETROPERITONEUM: Stable small LEFT lower quadrant 2 cm fatty mass adjacent to the sigmoid colon may reflect prior bout of epiploic appendagitis without acute component. Aortoiliac vessels are normal in course and caliber. No lymphadenopathy by CT size criteria. Internal reproductive organs are unremarkable. No intraperitoneal free fluid nor free air.  SOFT TISSUE/OSSEOUS STRUCTURES: Non-suspicious. Mild  degenerative changes sacroiliac joints. Small fat containing umbilical hernia. Moderate degenerative changes lumbar spine.  IMPRESSION: Colitis, favoring infectious etiology, less likely inflammatory in without bowel obstruction or perforation. Normal appendix.  Mild intrahepatic biliary dilatation of gallbladder distention, no CT findings of cholelithiasis or acute cholecystitis.  Relatively stable indeterminate renal lesions without urolithiasis or obstructive uropathy. Followup MRI of the abdomen with and without contrast remains indicated.   Electronically Signed   By: Elon Alas M.D.   On: 08/02/2014 05:56   US Renal  08/18/2014   CLINICAL DATA:  Acute kidney injury.  EXAM: RENAL / URINARY TRACT ULTRASOUND COMPLETE  COMPARISON:  CT 08/02/2014 and 12/18/2013  FINDINGS: Right Kidney:  Length: 10.2 cm. There is fullness of the right renal pelvis but this is unchanged from the previous renal ultrasound and similar to the recent CT. Again noted is a hypoechoic cyst along the right kidney upper pole measuring up to 1.4 cm.  Left Kidney:  Length: 9.7 cm.  The left renal parenchyma is mildly heterogeneous but similar to the previous examination. There are probably multiple small cysts in the left kidney. There is a 1.3 cm anechoic cyst in the lower pole and a 2.1 cm anechoic cyst in the interpolar region. No significant left hydronephrosis.  Bladder:  Appears normal for degree of bladder distention. Urinary bladder is decompressed.  IMPRESSION: Fullness of the right renal pelvis appears chronic. No significant hydronephrosis.  Bilateral renal cysts.   Electronically Signed   By: Markus Daft M.D.   On: 08/18/2014 07:08   Dg Abd Acute W/chest  08/02/2014   CLINICAL DATA:  Abdominal pain and constipation. History of diabetes, hypertension.  EXAM: DG ABDOMEN ACUTE W/ 1V CHEST  COMPARISON:  Chest radiograph February 02, 2014  FINDINGS: Cardiomediastinal silhouette is unremarkable. Bibasilar strandy densities.  Lungs are otherwise clear, no pleural effusions. No pneumothorax. Soft tissue planes and included osseous structures are unremarkable.  Bowel gas pattern is nondilated and nonobstructive. Paucity of bowel gas, air-fluid levels in the RIGHT abdomen. Moderate amount of retained large bowel stool. Linear gas-filled structure in the RIGHT lower quadrant likely represents the appendix. No intra-abdominal mass effect, pathologic calcifications or free air. Soft tissue planes and included osseous structures are nonsuspicious.  IMPRESSION: Bibasilar atelectasis.  Air-fluid levels in the RIGHT abdomen could represent early/focal ileus or possibly enteritis with moderate amount of retained large bowel stool, no bowel obstruction.   Electronically Signed   By: Elon Alas M.D.   On: 08/02/2014 03:22   Admission HPI: Ms. Habeck is a 59 yo female with HTN, HLD, CKD stage 3, DMII, depression, and anxiety, presenting with abdominal pain. She has had abdominal pain for weeks, for which she has been to the ED multiple times over the last couple weeks. She was diagnosed with colitis and prescribed Cipro, which she has recently finished. She now presents again to the ED complaining of mild abdominal pain and a "warm feeling" in her stomach. She rates the pain at 6/10, radiating across her lower abdomen.  She has a history of neurogenic bladder, likely 2/2 poorly controlled diabetes. She reports decreased urine output during the day, but has to void every 30 minutes at for the past few days. She endorses normal volume voids each time she goes. Her A1c is currently 8.4.   In the ED, her creatinine was noted to be 2.42 (baseline 1.5-1.8). The ED was concerned for AKI and wanted to admit her. She endorses low PO intake over the last couple days.  PVR was 64 mL.   She denies fever, chills, nausea, vomiting, constipation or diarrhea. She denies dysuria.  Hospital Course by problem list: Active Problems:    Diabetes mellitus with stage 3 chronic kidney disease   HLD (hyperlipidemia)   Anxiety state   Essential hypertension   Low back pain   Lower abdominal pain   Urge incontinence   Acute kidney injury   AKI (acute kidney injury)   AKI on CKD3: Her elevated Cr was believe to be secondary to mild dehydration and started on IVF. There was no evidence of UTI and no dysuria. U/A showed only rare bacteria, small leukocytes, negative for nitrite, 7-10 WBC. An acute interstitial nephritis could not be ruled out, especially in the setting of recent ciprofloxacin use. We therefore obtained a urine for eosinophils to assess for this possibility. Renal US showed no significant hydronephrosis but a chronic appearing fullness of right renal pelvis. FENa was 1.96%. Cr improved with IVF,  2.42 to 2.1 to 1.84. The following morning the patient was feeling better and with improved Cr felt to be ready for discharge. Instructed to drink plenty of water at home.   HTN: Blood pressure was in the 109-323F systolic. She takes Enalapril at home. Enalapril was held due to AKI. Cr improved and Enalapril was resumed on discharge.    DMII: Her last A1c was 8.4 in June 2016. Home regimen is Lantus 22 units and pioglitazone. Pioglitazone was held. Was given Lantus 15 Units QHS and put on SSI. Home regimen resumed on discharge.   HLD: Home dose Simvastatin continued on admission and at discharge.   Anxiety/Depression: Home dose Sertraline 100 mg was continued on admission and at discharge.   Discharge Vitals:   BP 123/53 mmHg  Pulse 72  Temp(Src) 98 F (36.7 C) (Oral)  Resp 18  Ht 5\' 6"  (1.676 m)  Wt 76.204 kg (168 lb)  BMI 27.13 kg/m2  SpO2 100%  Discharge Labs:  Results for orders placed or performed during the hospital encounter of 08/18/14 (from the past 24 hour(s))  Glucose, capillary     Status: Abnormal   Collection Time: 08/18/14  4:53 PM  Result Value Ref Range   Glucose-Capillary 61 (L) 65 - 99 mg/dL    Glucose, capillary     Status: None   Collection Time: 08/18/14  5:29 PM  Result Value Ref Range   Glucose-Capillary 91 65 - 99 mg/dL  Glucose, capillary     Status: Abnormal   Collection Time: 08/18/14  8:52 PM  Result Value Ref Range   Glucose-Capillary 219 (H) 65 - 99 mg/dL  Basic metabolic panel     Status: Abnormal   Collection Time: 08/19/14  3:42 AM  Result Value Ref Range   Sodium 135 135 - 145 mmol/L   Potassium 5.3 (H) 3.5 - 5.1 mmol/L   Chloride 106 101 - 111 mmol/L   CO2 24 22 - 32 mmol/L   Glucose, Bld 135 (H) 65 - 99 mg/dL   BUN 33 (H) 6 - 20 mg/dL   Creatinine, Ser 1.84 (H) 0.44 - 1.00 mg/dL   Calcium 7.7 (L) 8.9 - 10.3 mg/dL   GFR calc non Af Amer 29 (L) >60 mL/min   GFR calc Af Amer 34 (L) >60 mL/min   Anion gap 5 5 - 15  Glucose, capillary     Status: None   Collection Time: 08/19/14  7:35 AM  Result Value Ref Range   Glucose-Capillary 67 65 - 99 mg/dL  Glucose, capillary     Status: None   Collection Time: 08/19/14  7:57 AM  Result Value Ref Range   Glucose-Capillary 76 65 - 99 mg/dL  Glucose, capillary     Status: Abnormal   Collection Time: 08/19/14 11:50 AM  Result Value Ref Range   Glucose-Capillary 144 (H) 65 - 99 mg/dL   Signed: Maryellen Pile, MD 08/19/2014, 2:20 PM

## 2014-08-22 ENCOUNTER — Emergency Department (HOSPITAL_COMMUNITY)
Admission: EM | Admit: 2014-08-22 | Discharge: 2014-08-22 | Disposition: A | Discharge status: Home / Self Care | Payer: No Typology Code available for payment source | Attending: Emergency Medicine | Admitting: Emergency Medicine

## 2014-08-22 ENCOUNTER — Encounter (HOSPITAL_COMMUNITY): Payer: Self-pay | Admitting: Emergency Medicine

## 2014-08-22 DIAGNOSIS — Z8739 Personal history of other diseases of the musculoskeletal system and connective tissue: Secondary | ICD-10-CM | POA: Insufficient documentation

## 2014-08-22 DIAGNOSIS — F329 Major depressive disorder, single episode, unspecified: Secondary | ICD-10-CM | POA: Insufficient documentation

## 2014-08-22 DIAGNOSIS — Z9889 Other specified postprocedural states: Secondary | ICD-10-CM | POA: Insufficient documentation

## 2014-08-22 DIAGNOSIS — E785 Hyperlipidemia, unspecified: Secondary | ICD-10-CM | POA: Insufficient documentation

## 2014-08-22 DIAGNOSIS — G8929 Other chronic pain: Secondary | ICD-10-CM | POA: Insufficient documentation

## 2014-08-22 DIAGNOSIS — Z8719 Personal history of other diseases of the digestive system: Secondary | ICD-10-CM | POA: Insufficient documentation

## 2014-08-22 DIAGNOSIS — I129 Hypertensive chronic kidney disease with stage 1 through stage 4 chronic kidney disease, or unspecified chronic kidney disease: Secondary | ICD-10-CM | POA: Insufficient documentation

## 2014-08-22 DIAGNOSIS — Z862 Personal history of diseases of the blood and blood-forming organs and certain disorders involving the immune mechanism: Secondary | ICD-10-CM | POA: Insufficient documentation

## 2014-08-22 DIAGNOSIS — R1013 Epigastric pain: Secondary | ICD-10-CM | POA: Insufficient documentation

## 2014-08-22 DIAGNOSIS — Z79899 Other long term (current) drug therapy: Secondary | ICD-10-CM | POA: Insufficient documentation

## 2014-08-22 DIAGNOSIS — N183 Chronic kidney disease, stage 3 (moderate): Secondary | ICD-10-CM | POA: Insufficient documentation

## 2014-08-22 DIAGNOSIS — F419 Anxiety disorder, unspecified: Secondary | ICD-10-CM | POA: Insufficient documentation

## 2014-08-22 DIAGNOSIS — Z8774 Personal history of (corrected) congenital malformations of heart and circulatory system: Secondary | ICD-10-CM | POA: Insufficient documentation

## 2014-08-22 DIAGNOSIS — E119 Type 2 diabetes mellitus without complications: Secondary | ICD-10-CM | POA: Insufficient documentation

## 2014-08-22 DIAGNOSIS — N179 Acute kidney failure, unspecified: Secondary | ICD-10-CM

## 2014-08-22 DIAGNOSIS — Z87440 Personal history of urinary (tract) infections: Secondary | ICD-10-CM | POA: Insufficient documentation

## 2014-08-22 DIAGNOSIS — R112 Nausea with vomiting, unspecified: Secondary | ICD-10-CM

## 2014-08-22 DIAGNOSIS — Z794 Long term (current) use of insulin: Secondary | ICD-10-CM | POA: Insufficient documentation

## 2014-08-22 LAB — CBC
HEMATOCRIT: 35.6 % — AB (ref 36.0–46.0)
Hemoglobin: 12.1 g/dL (ref 12.0–15.0)
MCH: 29.9 pg (ref 26.0–34.0)
MCHC: 34 g/dL (ref 30.0–36.0)
MCV: 87.9 fL (ref 78.0–100.0)
Platelets: 251 10*3/uL (ref 150–400)
RBC: 4.05 MIL/uL (ref 3.87–5.11)
RDW: 12.6 % (ref 11.5–15.5)
WBC: 15.6 10*3/uL — ABNORMAL HIGH (ref 4.0–10.5)

## 2014-08-22 LAB — COMPREHENSIVE METABOLIC PANEL
ALK PHOS: 75 U/L (ref 38–126)
ALT: 19 U/L (ref 14–54)
AST: 24 U/L (ref 15–41)
Albumin: 3.8 g/dL (ref 3.5–5.0)
Anion gap: 11 (ref 5–15)
BUN: 30 mg/dL — ABNORMAL HIGH (ref 6–20)
CO2: 24 mmol/L (ref 22–32)
Calcium: 9.1 mg/dL (ref 8.9–10.3)
Chloride: 99 mmol/L — ABNORMAL LOW (ref 101–111)
Creatinine, Ser: 1.96 mg/dL — ABNORMAL HIGH (ref 0.44–1.00)
GFR, EST AFRICAN AMERICAN: 32 mL/min — AB (ref >60)
GFR, EST NON AFRICAN AMERICAN: 27 mL/min — AB (ref >60)
GLUCOSE: 190 mg/dL — AB (ref 65–99)
Potassium: 5.1 mmol/L (ref 3.5–5.1)
Sodium: 134 mmol/L — ABNORMAL LOW (ref 135–145)
TOTAL PROTEIN: 6.5 g/dL (ref 6.5–8.1)
Total Bilirubin: 0.9 mg/dL (ref 0.3–1.2)

## 2014-08-22 LAB — URINALYSIS, ROUTINE W REFLEX MICROSCOPIC
Bilirubin Urine: NEGATIVE
Glucose, UA: NEGATIVE mg/dL
Hgb urine dipstick: NEGATIVE
KETONES UR: NEGATIVE mg/dL
NITRITE: NEGATIVE
PH: 5 (ref 5.0–8.0)
Protein, ur: NEGATIVE mg/dL
Specific Gravity, Urine: 1.012 (ref 1.005–1.030)
UROBILINOGEN UA: 0.2 mg/dL (ref 0.0–1.0)

## 2014-08-22 LAB — LIPASE, BLOOD: Lipase: 14 U/L — ABNORMAL LOW (ref 22–51)

## 2014-08-22 LAB — URINE MICROSCOPIC-ADD ON

## 2014-08-22 MED ORDER — ONDANSETRON HCL 4 MG/2ML IJ SOLN: 4.0000 mg | Freq: Once | INTRAMUSCULAR | Status: DC

## 2014-08-22 MED ORDER — SODIUM CHLORIDE 0.9 % IV BOLUS (SEPSIS)
1000.0000 mL | Freq: Once | INTRAVENOUS | Status: AC
  Administered 2014-08-22: 1000 mL via INTRAVENOUS

## 2014-08-22 MED ORDER — ONDANSETRON 4 MG PO TBDP: 4.0000 mg | ORAL_TABLET | Freq: Once | ORAL | Status: DC

## 2014-08-22 NOTE — ED Provider Notes (Signed)
CSN: [id number]     Arrival date & time 08/22/14  1445 History   First MD Initiated Contact with Patient 08/22/14 1657     Chief Complaint  Patient presents with  . Nausea    The patient was admitted to this hospital with dehydration and was discharged.  She is here today because she has been nauseous and throwing up .  Marland Kitchen Emesis    (Consider location/radiation/quality/duration/timing/severity/associated sxs/prior Treatment) Patient is a 59 y.o. female presenting with vomiting. The history is provided by the patient. No language interpreter was used.  Emesis Severity:  Moderate Timing:  Intermittent Number of daily episodes:  4 Quality:  Stomach contents Progression:  Unchanged Chronicity:  Recurrent Recent urination:  Normal Relieved by:  Nothing Worsened by:  Nothing tried Ineffective treatments:  Antiemetics Associated symptoms: abdominal pain and diarrhea   Associated symptoms: no chills, no cough, no fever, no headaches, no myalgias, no sore throat and no URI   Risk factors: no diabetes, no suspect food intake and no travel to endemic areas     Past Medical History  Diagnosis Date  . Diabetes mellitus type II, uncontrolled   . Hypertension   . Hyperlipidemia   . Ovarian cyst, left   . Anemia     due to menorrhagia, BL 8-10  . Vaginal cyst     nabothian and bartholin  . CKD (chronic kidney disease) stage 3, GFR 30-59 ml/min     baseline creatinine 1.4-1.7  . Anxiety   . Depression   . Postmenopausal bleeding 06/12/2008  . 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 UTI (lower urinary tract infection)   . IBS (irritable bowel syndrome)   . Chronic back pain   . Chronic abdominal pain   . DDD (degenerative disc disease), lumbar   . Bilateral renal cysts 01/15/2009    Qualifier: Diagnosis of  By: Tyrell Antonio MD, [place]    . Diabetes mellitus without complication   . History of palpitations     evaluated recently 12'15  . Colitis    Past Surgical  History  Procedure Laterality Date  . Cardiac surgery      to repair congenital defect as a child- 56months old   Family History  Problem Relation Age of Onset  . Stroke Father   . Heart attack Father     Had MI in his 62s  . Stomach cancer Paternal Grandmother   . Diabetes Maternal Grandmother   . Cerebral palsy Daughter   . Anesthesia problems Neg Hx   . Hypotension Neg Hx   . Malignant hyperthermia Neg Hx   . Pseudochol deficiency Neg Hx    History  Substance Use Topics  . Smoking status: Never Smoker   . Smokeless tobacco: Never Used  . Alcohol Use: No   OB History    Gravida Para Term Preterm AB TAB SAB Ectopic Multiple Living   3 1 1  2  2   1      Review of Systems  Constitutional: Negative for fever, chills, diaphoresis and fatigue.  HENT: Negative for sore throat.   Respiratory: Negative for cough, chest tightness and shortness of breath.   Cardiovascular: Negative for chest pain.  Gastrointestinal: Positive for nausea, vomiting, abdominal pain and diarrhea. Negative for constipation and blood in stool.  Genitourinary: Negative for dysuria.  Musculoskeletal: Negative for myalgias and gait problem.  Neurological: Negative for weakness, light-headedness and headaches.  Psychiatric/Behavioral: Negative for confusion.  All  other systems reviewed and are negative.     Allergies  Review of patient's allergies indicates no known allergies.  Home Medications   Prior to Admission medications   Medication Sig Start Date End Date Taking? Authorizing Provider  acetaminophen (TYLENOL) 500 MG tablet Take 500-1,000 mg by mouth every 6 (six) hours as needed for moderate pain.   Yes Historical Provider, MD  enalapril (VASOTEC) 10 MG tablet Take 1 tablet (10 mg total) by mouth daily. Patient taking differently: Take 20 mg by mouth daily.  06/08/14  Yes Carly Montey Hora, MD  Insulin Glargine (LANTUS SOLOSTAR) 100 UNIT/ML Solostar Pen Inject 22 Units into the skin daily at 10 pm.  06/10/14  Yes Juliet Rude, MD  ondansetron (ZOFRAN ODT) 4 MG disintegrating tablet 4mg  ODT q4 hours prn nausea/vomit 08/02/14  Yes Comer Locket, PA-C  oxyCODONE-acetaminophen (PERCOCET/ROXICET) 5-325 MG per tablet Take 1 tablet by mouth every 4 (four) hours as needed for severe pain. 07/29/14  Yes Virgel Manifold, MD  pioglitazone (ACTOS) 15 MG tablet Take 1 tablet (15 mg total) by mouth daily. Patient taking differently: Take 30 mg by mouth daily.  07/11/14  Yes Milagros Loll, MD  simvastatin (ZOCOR) 40 MG tablet TAKE 1 TABLET (40 MG TOTAL) BY MOUTH AT BEDTIME. 11/26/13  Yes [name], MD  cyclobenzaprine (FLEXERIL) 5 MG tablet Take 1 tablet (5 mg total) by mouth at bedtime as needed for muscle spasms. Patient not taking: Reported on 08/22/2014 08/01/14   Lucious Groves, DO  traMADol (ULTRAM) 50 MG tablet Take 1 tablet (50 mg total) by mouth every 6 (six) hours as needed. Patient not taking: Reported on 08/22/2014 07/28/14   [name], MD     Filed Vitals:   08/22/14 1507 08/22/14 1926  BP: 117/60 110/51  Pulse: 104 79  Temp: 98 F (36.7 C)   TempSrc: Oral   Resp: 18 18  Height: 5\' 6"  (1.676 m)   Weight: 168 lb 11.2 oz (76.522 kg)   SpO2: 97% 99%    Physical Exam  Constitutional: She is oriented to person, place, and time. She appears well-developed and well-nourished. No distress.  HENT:  Head: Normocephalic and atraumatic.  Nose: Nose normal.  Mouth/Throat: Oropharynx is clear and moist. No oropharyngeal exudate.  Eyes: EOM are normal. Pupils are equal, round, and reactive to light.  Neck: Normal range of motion. Neck supple.  Cardiovascular: Normal rate, regular rhythm, normal heart sounds and intact distal pulses.   No murmur heard. Pulmonary/Chest: Effort normal and breath sounds normal. No respiratory distress. She has no wheezes. She exhibits no tenderness.  Abdominal: Soft. There is tenderness (minimal epigastric tenderness). There is no rebound and no  guarding.  Soft, no guarding, no rebound, able to change positions easily  Musculoskeletal: Normal range of motion. She exhibits no tenderness.  Lymphadenopathy:    She has no cervical adenopathy.  Neurological: She is alert and oriented to person, place, and time. No cranial nerve deficit. Coordination normal.  Skin: Skin is warm and dry. She is not diaphoretic.  Psychiatric: She has a normal mood and affect. Her behavior is normal. Judgment and thought content normal.  Nursing note and vitals reviewed.   ED Course  Procedures (including critical care time) Labs Review Labs Reviewed  LIPASE, BLOOD - Abnormal; Notable for the following:    Lipase 14 (*)    All other components within normal limits  COMPREHENSIVE METABOLIC PANEL - Abnormal; Notable for the following:  Sodium 134 (*)    Chloride 99 (*)    Glucose, Bld 190 (*)    BUN 30 (*)    Creatinine, Ser 1.96 (*)    GFR calc non Af Amer 27 (*)    GFR calc Af Amer 32 (*)    All other components within normal limits  URINALYSIS, ROUTINE W REFLEX MICROSCOPIC (NOT AT [facility]) - Abnormal; Notable for the following:    Leukocytes, UA MODERATE (*)    All other components within normal limits  CBC - Abnormal; Notable for the following:    WBC 15.6 (*)    HCT 35.6 (*)    All other components within normal limits  URINE MICROSCOPIC-ADD ON - Abnormal; Notable for the following:    Squamous Epithelial / LPF MANY (*)    Bacteria, UA MANY (*)    All other components within normal limits    Imaging Review No results found.   EKG Interpretation None      MDM   Final diagnoses:  AKI (acute kidney injury)  Non-intractable vomiting with nausea, vomiting of unspecified type   Pt is a 59 yo F with hx of HTN, DM, HLD, CKD, IBS, and colitis who presents complaining of nausea, vomiting, diarrhea, and concern for dehydration.  She was seen 3 weeks ago with 2 weeks of GI pain and diagnosed with colitis, then returned on 7/11 and was  found to be in AKI on CKD.  Admitted to medicine for rehydration overnight, then discharged on 7/12.  Now presents with return N/V and one day of loose stool.  Had been constipated for 2 days so took a dulcolax yesterday, and started to have loose stool today.  Then had several episodes of NBNB emesis and persistent nausea.  Afebrile.  Minimal abdominal pain. No dysuria.    Well appearing.  Benign abdominal exam.   Offered zofran but pt declined.   Found to have a mild Cr bump, at 1.96 from baseline around 1.8.  Due to hx, will give NS bolus.  Lipase 14.  Normal LFTs.   UA looks dirty.  Sent for culture.  Asymptomtic and doubt UTI based on UA so will not empirically treat.   No indication of an acute emergent abdominal pathology based on benign labs and exam.  Loose stool 2/2 stool softener.  N/V could be persistent from recent GI illness or could be new onset viral gastritis.  Patient was able to tolerate PO intake and was considered stable for discharge home.  Encouraged good PO hydration and has antiemetics at home.  Given instructions to f/u with PCP in 3 days if sx continue.  Discussed ED return precautions and all questions answered prior to dc home.   If performed, labs, EKGs, and imaging were reviewed and interpreted by myself and my attending, and incorporated in the medical decision making.  Patient was seen with ED Attending, Dr. Elisabeth Most, MD   Tori Milks, MD 08/23/14 [id number]  Malvin Johns, MD 08/23/14 1501

## 2014-08-22 NOTE — ED Notes (Signed)
The patient was admitted to this hospital with dehydration and was discharged.  She is here today because she has been nauseous and throwing up.  The patient said her left side is hurting her.  The patient also said she has not been drinking anything.  She rates her pain 5/10.

## 2014-08-22 NOTE — Discharge Instructions (Signed)
Acute Kidney Injury Acute kidney injury is a disease in which there is sudden (acute) damage to the kidneys. The kidneys are 2 organs that lie on either side of the spine between the middle of the back and the front of the abdomen. The kidneys:  Remove wastes and extra water from the blood.   Produce important hormones. These help keep bones strong, regulate blood pressure, and help create red blood cells.   Balance the fluids and chemicals in the blood and tissues. A small amount of kidney damage may not cause problems, but a large amount of damage may make it difficult or impossible for the kidneys to work the way they should. Acute kidney injury may develop into long-lasting (chronic) kidney disease. It may also develop into a life-threatening disease called end-stage kidney disease. Acute kidney injury can get worse very quickly, so it should be treated right away. Early treatment may prevent other kidney diseases from developing.  CAUSES   A problem with blood flow to the kidneys. This may be caused by:   Blood loss.   Heart disease.   Severe burns.   Liver disease.  Direct damage to the kidneys. This may be caused by:  Some medicines.   A kidney infection.   Poisoning or consuming toxic substances.   A surgical wound.   A blow to the kidney area.   A problem with urine flow. This may be caused by:   Cancer.   Kidney stones.   An enlarged prostate. SYMPTOMS   Swelling (edema) of the legs, ankles, or feet.   Tiredness (lethargy).   Nausea or vomiting.   Confusion.   Problems with urination, such as:   Painful or burning feeling during urination.   Decreased urine production.   Frequent accidents in children who are potty trained.   Bloody urine.   Muscle twitches and cramps.   Shortness of breath.   Seizures.   Chest pain or pressure. Sometimes, no symptoms are present. DIAGNOSIS Acute kidney injury may be detected  and diagnosed by tests, including blood, urine, imaging, or kidney biopsy tests.  TREATMENT Treatment of acute kidney injury varies depending on the cause and severity of the kidney damage. In mild cases, no treatment may be needed. The kidneys may heal on their own. If acute kidney injury is more severe, your caregiver will treat the cause of the kidney damage, help the kidneys heal, and prevent complications from occurring. Severe cases may require a procedure to remove toxic wastes from the body (dialysis) or surgery to repair kidney damage. Surgery may involve:   Repair of a torn kidney.   Removal of an obstruction. Most of the time, you will need to stay overnight at the hospital.  HOME CARE INSTRUCTIONS:  Follow your prescribed diet.  Only take over-the-counter or prescription medicines as directed by your caregiver.  Do not take any new medicines (prescription, over-the-counter, or nutritional supplements) unless approved by your caregiver. Many medicines can worsen your kidney damage or need to have the dose adjusted.   Keep all follow-up appointments as directed by your caregiver.  Observe your condition to make sure you are healing as expected. SEEK IMMEDIATE MEDICAL CARE IF:  You are feeling ill or have severe pain in the back or side.   Your symptoms return or you have new symptoms.  You have any symptoms of end-stage kidney disease. These include:   Persistent itchiness.   Loss of appetite.   Headaches.   Abnormally dark   or light skin.  Numbness in the hands or feet.   Easy bruising.   Frequent hiccups.   Menstruation stops.   You have a fever.  You have increased urine production.  You have pain or bleeding when urinating. MAKE SURE YOU:   Understand these instructions.  Will watch your condition.  Will get help right away if you are not doing well or get worse Document Released: 08/09/2010 Document Revised: 05/21/2012 Document  Reviewed: 09/23/2011 [employer] Patient Information 2015 [employer]. This information is not intended to replace advice given to you by your health care provider. Make sure you discuss any questions you have with your health care provider.  

## 2014-08-25 ENCOUNTER — Telehealth: Payer: Self-pay | Admitting: Internal Medicine

## 2014-08-25 NOTE — Telephone Encounter (Signed)
Call to patient to confirm appointment for 08/26/14 at 1:45 lmtcb

## 2014-08-26 ENCOUNTER — Ambulatory Visit (INDEPENDENT_AMBULATORY_CARE_PROVIDER_SITE_OTHER): Payer: No Typology Code available for payment source | Admitting: Internal Medicine

## 2014-08-26 ENCOUNTER — Encounter: Payer: Self-pay | Admitting: Internal Medicine

## 2014-08-26 VITALS — BP 146/77 | HR 84 | Temp 98.3°F | Ht 64.0 in | Wt 165.5 lb

## 2014-08-26 DIAGNOSIS — Z79899 Other long term (current) drug therapy: Secondary | ICD-10-CM

## 2014-08-26 DIAGNOSIS — G8929 Other chronic pain: Secondary | ICD-10-CM

## 2014-08-26 DIAGNOSIS — M545 Low back pain, unspecified: Secondary | ICD-10-CM

## 2014-08-26 DIAGNOSIS — N179 Acute kidney failure, unspecified: Secondary | ICD-10-CM

## 2014-08-26 DIAGNOSIS — E785 Hyperlipidemia, unspecified: Secondary | ICD-10-CM

## 2014-08-26 DIAGNOSIS — R109 Unspecified abdominal pain: Secondary | ICD-10-CM

## 2014-08-26 DIAGNOSIS — K219 Gastro-esophageal reflux disease without esophagitis: Secondary | ICD-10-CM

## 2014-08-26 DIAGNOSIS — I129 Hypertensive chronic kidney disease with stage 1 through stage 4 chronic kidney disease, or unspecified chronic kidney disease: Secondary | ICD-10-CM

## 2014-08-26 DIAGNOSIS — N183 Chronic kidney disease, stage 3 unspecified: Secondary | ICD-10-CM

## 2014-08-26 DIAGNOSIS — Z794 Long term (current) use of insulin: Secondary | ICD-10-CM

## 2014-08-26 DIAGNOSIS — R1084 Generalized abdominal pain: Secondary | ICD-10-CM

## 2014-08-26 DIAGNOSIS — I10 Essential (primary) hypertension: Secondary | ICD-10-CM

## 2014-08-26 DIAGNOSIS — E1122 Type 2 diabetes mellitus with diabetic chronic kidney disease: Secondary | ICD-10-CM

## 2014-08-26 DIAGNOSIS — Z0000 Encounter for general adult medical examination without abnormal findings: Secondary | ICD-10-CM

## 2014-08-26 LAB — GLUCOSE, CAPILLARY: GLUCOSE-CAPILLARY: 184 mg/dL — AB (ref 65–99)

## 2014-08-26 MED ORDER — PIOGLITAZONE HCL 15 MG PO TABS: 30.0000 mg | ORAL_TABLET | Freq: Every day | ORAL | Status: DC

## 2014-08-26 NOTE — Assessment & Plan Note (Signed)
Patient takes Simvastatin 40 mg daily. Last LDL 72 in April 2015, which is nearly at goal of < 70 with her diabetes.  - Repeat lipid panel today - Continue simvastatin 40 mg daily

## 2014-08-26 NOTE — Assessment & Plan Note (Signed)
Patient due for colonoscopy. She has an appointment with GI next week. Will f/u recommendations.

## 2014-08-26 NOTE — Assessment & Plan Note (Signed)
Patient with hx of chronic abdominal pain for which she has been seen multiple times in the clinic and in the ED for several months. Location keeps changing, from RLQ to lower abdomen to groin. One of the times she did have a UTI which was treated with Keflex. She states her abdominal pain has completely resolved now. She describes occasionally having more of a "discomfort" on her right lateral abdomen. She thinks it may be due to constipation. She reports having bowel movements every few days. Discomfort is relieved after having a bowel movement. She states laxatives cause her stomach to be upset. She notes she was more regular when she ate applesauce and honey nut cheerios. She denies fever, chills, nausea, vomiting, dysuria, melena, and hematochezia. She has an appointment with GI next week.  - Recommended to incorporate more fruits and vegetables in her diet  - Continue to monitor - f/u GI recommendations

## 2014-08-26 NOTE — Assessment & Plan Note (Signed)
Patient reports her back pain is well controlled with tylenol 650 mg as needed. She reports only taking tylenol 650 mg about once every few weeks.

## 2014-08-26 NOTE — Patient Instructions (Signed)
It was a pleasure seeing you today, [name].  - Try eating more fruits and vegetables to help with your constipation. You can try things like prunes, applesauce, oranges. If you do drink prune juice or apple juice be sure to water it down as it can cause your blood sugars to be high. - Continue Lantus 22 units at 10 PM. Please try to take your Actos regularly. - Lab work today  [employer]:   Thank you for bringing your medicines today. This helps [place] keep you safe from mistakes.   Progress Toward Treatment Goals:  Treatment Goal 07/11/2014  Hemoglobin A1C unchanged  Blood pressure at goal    Self Care Goals & Plans:  Self Care Goal 08/26/2014  Manage my medications bring my medications to every visit; take my medicines as prescribed; refill my medications on time  Monitor my health keep track of my blood glucose; bring my glucose meter and log to each visit; keep track of my blood pressure; check my feet daily  Eat healthy foods eat more vegetables; eat foods that are low in salt; eat baked foods instead of fried foods  Be physically active find an activity I enjoy  Meeting treatment goals -    Home Blood Glucose Monitoring 06/17/2014  Check my blood sugar 2 times a day  When to check my blood sugar before breakfast; before dinner     Care Management & Community Referrals:  Referral 06/17/2014  Referrals made for care management support financial counselor

## 2014-08-26 NOTE — Assessment & Plan Note (Signed)
Patient was just hospitalized from 7/11-7/12 for AKI on CKD secondary to volume depletion. Her Cr upon discharge was 1.84. D/c summary states urine eosinophils ordered but do not see this in results. Her baseline Cr is 1.5-1.6. 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

## 2014-08-26 NOTE — Assessment & Plan Note (Signed)
Lab Results  Component Value Date   HGBA1C 8.3 08/05/2014   HGBA1C 8.7 04/29/2014   HGBA1C 9.1 01/06/2014     Assessment: Diabetes control: fair control Progress toward A1C goal:  unable to assess Comments: Blood sugars excellent in morning, ranging from 70s-140s. She only has a few readings in the afternoon/evening which are all elevated in the high 200s-300s. She admits to not taking her Actos regularly. She reports occasional episodes of hypoglycemia in the 60s. She is aware when she has hypoglycemia.   Plan: Medications:  Continue Lantus 22 units QHS and Actos 30 mg daily. Since patient taking Actos infrequently and occasionally getting hypoglycemia may need to decrease Lantus once taking Actos more regularly.  Home glucose monitoring: Frequency: 2 times a day Timing: before breakfast, before dinner Instruction/counseling given: reminded to bring blood glucose meter & log to each visit, reminded to bring medications to each visit and discussed diet Self management tools provided: copy of home glucose meter download Other plans:  - Repeat HbA1c in 2 months  - f/u in 1 month to reassess blood sugars

## 2014-08-26 NOTE — Assessment & Plan Note (Signed)
Patient reports taking famotidine 40 mg daily as needed. She denies having GERD.  - Continue to monitor

## 2014-08-26 NOTE — Assessment & Plan Note (Signed)
BP Readings from Last 3 Encounters:  08/26/14 146/77  08/22/14 110/51  08/19/14 123/53    Lab Results  Component Value Date   NA 134* 08/22/2014   K 5.1 08/22/2014   CREATININE 1.96* 08/22/2014    Assessment: Blood pressure control: mildly elevated Progress toward BP goal:  deteriorated Comments: BP slightly elevated today. Patient states she is under a lot of stress.   Plan: Medications:  Continue Enalapril 10 mg daily. Patient has refused to go up on her dose of Enalapril in the past.

## 2014-08-26 NOTE — Progress Notes (Signed)
   Subjective:    Patient ID: Wafa Martes, female    DOB: 12/10/1955, 59 y.o.   MRN: [id number]  HPI Ms. Kosiba is a 59yo woman with PMHx of HTN, Type 2 DM, CKD Stage 3, and hyperlipidemia who presents today for a routine visit.  Please refer to A&P documentation.    Review of Systems General: Denies fever, chills, night sweats, changes in weight, changes in appetite HEENT: Denies headaches, ear pain, changes in vision, rhinorrhea, sore throat CV: Denies CP, palpitations, SOB, orthopnea Pulm: Denies SOB, cough, wheezing GI: Reports abdominal pain, constipation. Denies nausea, vomiting, diarrhea, melena, hematochezia GU: Denies dysuria, hematuria, frequency Msk: Denies muscle cramps, joint pains Neuro: Denies weakness, numbness, tingling Skin: Denies rashes, bruising    Objective:   Physical Exam General: sitting up in chair, pressured speech, NAD HEENT: [place]/AT, EOMI, sclera anicteric, mucus membranes moist CV: RRR, no m/g/r Pulm: CTA bilaterally, breaths non-labored Abd: BS+, soft, non-tender, non-distended, no CVA tenderness Ext: warm, no edema Neuro: alert and oriented x 3, no focal deficits    Assessment & Plan:  Refer to A&P documentation.

## 2014-08-27 ENCOUNTER — Telehealth: Payer: Self-pay | Admitting: Internal Medicine

## 2014-08-27 ENCOUNTER — Emergency Department (HOSPITAL_COMMUNITY)
Admission: EM | Admit: 2014-08-27 | Discharge: 2014-08-27 | Disposition: A | Discharge status: Home / Self Care | Payer: No Typology Code available for payment source | Attending: Emergency Medicine | Admitting: Emergency Medicine

## 2014-08-27 ENCOUNTER — Encounter (HOSPITAL_COMMUNITY): Payer: Self-pay | Admitting: Emergency Medicine

## 2014-08-27 DIAGNOSIS — Q6102 Congenital multiple renal cysts: Secondary | ICD-10-CM | POA: Insufficient documentation

## 2014-08-27 DIAGNOSIS — R110 Nausea: Secondary | ICD-10-CM | POA: Insufficient documentation

## 2014-08-27 DIAGNOSIS — R350 Frequency of micturition: Secondary | ICD-10-CM | POA: Insufficient documentation

## 2014-08-27 DIAGNOSIS — E119 Type 2 diabetes mellitus without complications: Secondary | ICD-10-CM | POA: Insufficient documentation

## 2014-08-27 DIAGNOSIS — R109 Unspecified abdominal pain: Secondary | ICD-10-CM

## 2014-08-27 DIAGNOSIS — R4789 Other speech disturbances: Secondary | ICD-10-CM | POA: Insufficient documentation

## 2014-08-27 DIAGNOSIS — Z8739 Personal history of other diseases of the musculoskeletal system and connective tissue: Secondary | ICD-10-CM | POA: Insufficient documentation

## 2014-08-27 DIAGNOSIS — I129 Hypertensive chronic kidney disease with stage 1 through stage 4 chronic kidney disease, or unspecified chronic kidney disease: Secondary | ICD-10-CM | POA: Insufficient documentation

## 2014-08-27 DIAGNOSIS — Z79899 Other long term (current) drug therapy: Secondary | ICD-10-CM | POA: Insufficient documentation

## 2014-08-27 DIAGNOSIS — F419 Anxiety disorder, unspecified: Secondary | ICD-10-CM | POA: Insufficient documentation

## 2014-08-27 DIAGNOSIS — N183 Chronic kidney disease, stage 3 (moderate): Secondary | ICD-10-CM | POA: Insufficient documentation

## 2014-08-27 DIAGNOSIS — Z87440 Personal history of urinary (tract) infections: Secondary | ICD-10-CM | POA: Insufficient documentation

## 2014-08-27 DIAGNOSIS — G8929 Other chronic pain: Secondary | ICD-10-CM | POA: Insufficient documentation

## 2014-08-27 DIAGNOSIS — E785 Hyperlipidemia, unspecified: Secondary | ICD-10-CM | POA: Insufficient documentation

## 2014-08-27 DIAGNOSIS — Z794 Long term (current) use of insulin: Secondary | ICD-10-CM | POA: Insufficient documentation

## 2014-08-27 DIAGNOSIS — Z862 Personal history of diseases of the blood and blood-forming organs and certain disorders involving the immune mechanism: Secondary | ICD-10-CM | POA: Insufficient documentation

## 2014-08-27 DIAGNOSIS — Q249 Congenital malformation of heart, unspecified: Secondary | ICD-10-CM | POA: Insufficient documentation

## 2014-08-27 DIAGNOSIS — Z8719 Personal history of other diseases of the digestive system: Secondary | ICD-10-CM | POA: Insufficient documentation

## 2014-08-27 LAB — URINALYSIS, ROUTINE W REFLEX MICROSCOPIC
Bilirubin Urine: NEGATIVE
Glucose, UA: NEGATIVE mg/dL
Hgb urine dipstick: NEGATIVE
Ketones, ur: NEGATIVE mg/dL
Nitrite: NEGATIVE
Protein, ur: NEGATIVE mg/dL
Specific Gravity, Urine: 1.005 (ref 1.005–1.030)
Urobilinogen, UA: 0.2 mg/dL (ref 0.0–1.0)
pH: 5 (ref 5.0–8.0)

## 2014-08-27 LAB — URINE MICROSCOPIC-ADD ON

## 2014-08-27 LAB — BASIC METABOLIC PANEL WITH GFR
BUN: 30 mg/dL — ABNORMAL HIGH (ref 6–23)
CO2: 25 mEq/L (ref 19–32)
CREATININE: 1.64 mg/dL — AB (ref 0.50–1.10)
Calcium: 9.8 mg/dL (ref 8.4–10.5)
Chloride: 99 mEq/L (ref 96–112)
GFR, EST AFRICAN AMERICAN: 40 mL/min — AB
GFR, EST NON AFRICAN AMERICAN: 34 mL/min — AB
GLUCOSE: 162 mg/dL — AB (ref 70–99)
Potassium: 4.5 mEq/L (ref 3.5–5.3)
Sodium: 141 mEq/L (ref 135–145)

## 2014-08-27 LAB — LIPID PANEL
CHOL/HDL RATIO: 3.6 ratio
Cholesterol: 194 mg/dL (ref 0–200)
HDL: 54 mg/dL (ref >=46)
LDL CALC: 93 mg/dL (ref 0–99)
Triglycerides: 237 mg/dL — ABNORMAL HIGH (ref <150)
VLDL: 47 mg/dL — ABNORMAL HIGH (ref 0–40)

## 2014-08-27 NOTE — ED Notes (Signed)
Pt with Hx of colitis c/o RLQ "warm feeling," denies pain, urinary frequency, c/o emesis and constipation. Pt states her CBG is 230 and this is high for her, states she has been shaking. Pt not currently shaking.

## 2014-08-27 NOTE — Discharge Instructions (Signed)
Abdominal Pain, Women °Abdominal (stomach, pelvic, or belly) pain can be caused by many things. It is important to tell your doctor: °· The location of the pain. °· Does it come and go or is it present all the time? °· Are there things that start the pain (eating certain foods, exercise)? °· Are there other symptoms associated with the pain (fever, nausea, vomiting, diarrhea)? °All of this is helpful to know when trying to find the cause of the pain. °CAUSES  °· Stomach: virus or bacteria infection, or ulcer. °· Intestine: appendicitis (inflamed appendix), regional ileitis (Crohn's disease), ulcerative colitis (inflamed colon), irritable bowel syndrome, diverticulitis (inflamed diverticulum of the colon), or cancer of the stomach or intestine. °· Gallbladder disease or stones in the gallbladder. °· Kidney disease, kidney stones, or infection. °· Pancreas infection or cancer. °· Fibromyalgia (pain disorder). °· Diseases of the female organs: °¨ Uterus: fibroid (non-cancerous) tumors or infection. °¨ Fallopian tubes: infection or tubal pregnancy. °¨ Ovary: cysts or tumors. °¨ Pelvic adhesions (scar tissue). °¨ Endometriosis (uterus lining tissue growing in the pelvis and on the pelvic organs). °¨ Pelvic congestion syndrome (female organs filling up with blood just before the menstrual period). °¨ Pain with the menstrual period. °¨ Pain with ovulation (producing an egg). °¨ Pain with an IUD (intrauterine device, birth control) in the uterus. °¨ Cancer of the female organs. °· Functional pain (pain not caused by a disease, may improve without treatment). °· Psychological pain. °· Depression. °DIAGNOSIS  °Your doctor will decide the seriousness of your pain by doing an examination. °· Blood tests. °· X-rays. °· Ultrasound. °· CT scan (computed tomography, special type of X-ray). °· MRI (magnetic resonance imaging). °· Cultures, for infection. °· Barium enema (dye inserted in the large intestine, to better view it with  X-rays). °· Colonoscopy (looking in intestine with a lighted tube). °· Laparoscopy (minor surgery, looking in abdomen with a lighted tube). °· Major abdominal exploratory surgery (looking in abdomen with a large incision). °TREATMENT  °The treatment will depend on the cause of the pain.  °· Many cases can be observed and treated at home. °· Over-the-counter medicines recommended by your caregiver. °· Prescription medicine. °· Antibiotics, for infection. °· Birth control pills, for painful periods or for ovulation pain. °· Hormone treatment, for endometriosis. °· Nerve blocking injections. °· Physical therapy. °· Antidepressants. °· Counseling with a psychologist or psychiatrist. °· Minor or major surgery. °HOME CARE INSTRUCTIONS  °· Do not take laxatives, unless directed by your caregiver. °· Take over-the-counter pain medicine only if ordered by your caregiver. Do not take aspirin because it can cause an upset stomach or bleeding. °· Try a clear liquid diet (broth or water) as ordered by your caregiver. Slowly move to a bland diet, as tolerated, if the pain is related to the stomach or intestine. °· Have a thermometer and take your temperature several times a day, and record it. °· Bed rest and sleep, if it helps the pain. °· Avoid sexual intercourse, if it causes pain. °· Avoid stressful situations. °· Keep your follow-up appointments and tests, as your caregiver orders. °· If the pain does not go away with medicine or surgery, you may try: °¨ Acupuncture. °¨ Relaxation exercises (yoga, meditation). °¨ Group therapy. °¨ Counseling. °SEEK MEDICAL CARE IF:  °· You notice certain foods cause stomach pain. °· Your home care treatment is not helping your pain. °· You need stronger pain medicine. °· You want your IUD removed. °· You feel faint or   lightheaded.  You develop nausea and vomiting.  You develop a rash.  You are having side effects or an allergy to your medicine. SEEK IMMEDIATE MEDICAL CARE IF:   Your  pain does not go away or gets worse.  You have a fever.  Your pain is felt only in portions of the abdomen. The right side could possibly be appendicitis. The left lower portion of the abdomen could be colitis or diverticulitis.  You are passing blood in your stools (bright red or black tarry stools, with or without vomiting).  You have blood in your urine.  You develop chills, with or without a fever.  You pass out. MAKE SURE YOU:   Understand these instructions.  Will watch your condition.  Will get help right away if you are not doing well or get worse. Document Released: 11/21/2006 Document Revised: 06/10/2013 Document Reviewed: 12/11/2008 [facility] Patient Information 2015 [place], [place]. This information is not intended to replace advice given to you by your health care provider. Make sure you discuss any questions you have with your health care provider.  Abdominal Pain, Women Abdominal (stomach, pelvic, or belly) pain can be caused by many things. It is important to tell your doctor:  The location of the pain.  Does it come and go or is it present all the time?  Are there things that start the pain (eating certain foods, exercise)?  Are there other symptoms associated with the pain (fever, nausea, vomiting, diarrhea)? All of this is helpful to know when trying to find the cause of the pain. CAUSES   Stomach: virus or bacteria infection, or ulcer.  Intestine: appendicitis (inflamed appendix), regional ileitis (Crohn's disease), ulcerative colitis (inflamed colon), irritable bowel syndrome, diverticulitis (inflamed diverticulum of the colon), or cancer of the stomach or intestine.  Gallbladder disease or stones in the gallbladder.  Kidney disease, kidney stones, or infection.  Pancreas infection or cancer.  Fibromyalgia (pain disorder).  Diseases of the female organs:  Uterus: fibroid (non-cancerous) tumors or infection.  Fallopian tubes: infection or tubal  pregnancy.  Ovary: cysts or tumors.  Pelvic adhesions (scar tissue).  Endometriosis (uterus lining tissue growing in the pelvis and on the pelvic organs).  Pelvic congestion syndrome (female organs filling up with blood just before the menstrual period).  Pain with the menstrual period.  Pain with ovulation (producing an egg).  Pain with an IUD (intrauterine device, birth control) in the uterus.  Cancer of the female organs.  Functional pain (pain not caused by a disease, may improve without treatment).  Psychological pain.  Depression. DIAGNOSIS  Your doctor will decide the seriousness of your pain by doing an examination.  Blood tests.  X-rays.  Ultrasound.  CT scan (computed tomography, special type of X-ray).  MRI (magnetic resonance imaging).  Cultures, for infection.  Barium enema (dye inserted in the large intestine, to better view it with X-rays).  Colonoscopy (looking in intestine with a lighted tube).  Laparoscopy (minor surgery, looking in abdomen with a lighted tube).  Major abdominal exploratory surgery (looking in abdomen with a large incision). TREATMENT  The treatment will depend on the cause of the pain.   Many cases can be observed and treated at home.  Over-the-counter medicines recommended by your caregiver.  Prescription medicine.  Antibiotics, for infection.  Birth control pills, for painful periods or for ovulation pain.  Hormone treatment, for endometriosis.  Nerve blocking injections.  Physical therapy.  Antidepressants.  Counseling with a psychologist or psychiatrist.  Minor or  major surgery. HOME CARE INSTRUCTIONS   Do not take laxatives, unless directed by your caregiver.  Take over-the-counter pain medicine only if ordered by your caregiver. Do not take aspirin because it can cause an upset stomach or bleeding.  Try a clear liquid diet (broth or water) as ordered by your caregiver. Slowly move to a bland diet, as  tolerated, if the pain is related to the stomach or intestine.  Have a thermometer and take your temperature several times a day, and record it.  Bed rest and sleep, if it helps the pain.  Avoid sexual intercourse, if it causes pain.  Avoid stressful situations.  Keep your follow-up appointments and tests, as your caregiver orders.  If the pain does not go away with medicine or surgery, you may try:  Acupuncture.  Relaxation exercises (yoga, meditation).  Group therapy.  Counseling. SEEK MEDICAL CARE IF:   You notice certain foods cause stomach pain.  Your home care treatment is not helping your pain.  You need stronger pain medicine.  You want your IUD removed.  You feel faint or lightheaded.  You develop nausea and vomiting.  You develop a rash.  You are having side effects or an allergy to your medicine. SEEK IMMEDIATE MEDICAL CARE IF:   Your pain does not go away or gets worse.  You have a fever.  Your pain is felt only in portions of the abdomen. The right side could possibly be appendicitis. The left lower portion of the abdomen could be colitis or diverticulitis.  You are passing blood in your stools (bright red or black tarry stools, with or without vomiting).  You have blood in your urine.  You develop chills, with or without a fever.  You pass out. MAKE SURE YOU:   Understand these instructions.  Will watch your condition.  Will get help right away if you are not doing well or get worse. Document Released: 11/21/2006 Document Revised: 06/10/2013 Document Reviewed: 12/11/2008 [facility] Patient Information 2015 [place], [place]. This information is not intended to replace advice given to you by your health care provider. Make sure you discuss any questions you have with your health care provider.  Abdominal Pain, Women Abdominal (stomach, pelvic, or belly) pain can be caused by many things. It is important to tell your doctor:  The location of  the pain.  Does it come and go or is it present all the time?  Are there things that start the pain (eating certain foods, exercise)?  Are there other symptoms associated with the pain (fever, nausea, vomiting, diarrhea)? All of this is helpful to know when trying to find the cause of the pain. CAUSES   Stomach: virus or bacteria infection, or ulcer.  Intestine: appendicitis (inflamed appendix), regional ileitis (Crohn's disease), ulcerative colitis (inflamed colon), irritable bowel syndrome, diverticulitis (inflamed diverticulum of the colon), or cancer of the stomach or intestine.  Gallbladder disease or stones in the gallbladder.  Kidney disease, kidney stones, or infection.  Pancreas infection or cancer.  Fibromyalgia (pain disorder).  Diseases of the female organs:  Uterus: fibroid (non-cancerous) tumors or infection.  Fallopian tubes: infection or tubal pregnancy.  Ovary: cysts or tumors.  Pelvic adhesions (scar tissue).  Endometriosis (uterus lining tissue growing in the pelvis and on the pelvic organs).  Pelvic congestion syndrome (female organs filling up with blood just before the menstrual period).  Pain with the menstrual period.  Pain with ovulation (producing an egg).  Pain with an IUD (intrauterine device, birth  control) in the uterus.  Cancer of the female organs.  Functional pain (pain not caused by a disease, may improve without treatment).  Psychological pain.  Depression. DIAGNOSIS  Your doctor will decide the seriousness of your pain by doing an examination.  Blood tests.  X-rays.  Ultrasound.  CT scan (computed tomography, special type of X-ray).  MRI (magnetic resonance imaging).  Cultures, for infection.  Barium enema (dye inserted in the large intestine, to better view it with X-rays).  Colonoscopy (looking in intestine with a lighted tube).  Laparoscopy (minor surgery, looking in abdomen with a lighted tube).  Major  abdominal exploratory surgery (looking in abdomen with a large incision). TREATMENT  The treatment will depend on the cause of the pain.   Many cases can be observed and treated at home.  Over-the-counter medicines recommended by your caregiver.  Prescription medicine.  Antibiotics, for infection.  Birth control pills, for painful periods or for ovulation pain.  Hormone treatment, for endometriosis.  Nerve blocking injections.  Physical therapy.  Antidepressants.  Counseling with a psychologist or psychiatrist.  Minor or major surgery. HOME CARE INSTRUCTIONS   Do not take laxatives, unless directed by your caregiver.  Take over-the-counter pain medicine only if ordered by your caregiver. Do not take aspirin because it can cause an upset stomach or bleeding.  Try a clear liquid diet (broth or water) as ordered by your caregiver. Slowly move to a bland diet, as tolerated, if the pain is related to the stomach or intestine.  Have a thermometer and take your temperature several times a day, and record it.  Bed rest and sleep, if it helps the pain.  Avoid sexual intercourse, if it causes pain.  Avoid stressful situations.  Keep your follow-up appointments and tests, as your caregiver orders.  If the pain does not go away with medicine or surgery, you may try:  Acupuncture.  Relaxation exercises (yoga, meditation).  Group therapy.  Counseling. SEEK MEDICAL CARE IF:   You notice certain foods cause stomach pain.  Your home care treatment is not helping your pain.  You need stronger pain medicine.  You want your IUD removed.  You feel faint or lightheaded.  You develop nausea and vomiting.  You develop a rash.  You are having side effects or an allergy to your medicine. SEEK IMMEDIATE MEDICAL CARE IF:   Your pain does not go away or gets worse.  You have a fever.  Your pain is felt only in portions of the abdomen. The right side could possibly be  appendicitis. The left lower portion of the abdomen could be colitis or diverticulitis.  You are passing blood in your stools (bright red or black tarry stools, with or without vomiting).  You have blood in your urine.  You develop chills, with or without a fever.  You pass out. MAKE SURE YOU:   Understand these instructions.  Will watch your condition.  Will get help right away if you are not doing well or get worse. Document Released: 11/21/2006 Document Revised: 06/10/2013 Document Reviewed: 12/11/2008 [facility] Patient Information 2015 [place], [place]. This information is not intended to replace advice given to you by your health care provider. Make sure you discuss any questions you have with your health care provider.

## 2014-08-27 NOTE — ED Notes (Signed)
Bed: PY09 Expected date:  Expected time:  Means of arrival:  Comments: EMS-cramps

## 2014-08-27 NOTE — Telephone Encounter (Signed)
Patient calling about pain in her right side.

## 2014-08-27 NOTE — ED Provider Notes (Signed)
CSN: [id number]     Arrival date & time 08/27/14  0569 History   First MD Initiated Contact with Patient 08/27/14 [phone]     Chief Complaint  Patient presents with  . Constipation  . Nausea     (Consider location/radiation/quality/duration/timing/severity/associated sxs/prior Treatment) HPI   59 year old female with urinary frequency and the "warm feeling" in her right lower quadrant. Onset last night. This "warm feeling" comes and goes. No appreciable exacerbating relieving factors. Not really painful. No dysuria. No fevers or chills. Patient is anxious and has somewhat rambling speech. Difficult to follow at times.   Past Medical History  Diagnosis Date  . Diabetes mellitus type II, uncontrolled   . Hypertension   . Hyperlipidemia   . Ovarian cyst, left   . Anemia     due to menorrhagia, BL 8-10  . Vaginal cyst     nabothian and bartholin  . CKD (chronic kidney disease) stage 3, GFR 30-59 ml/min     baseline creatinine 1.4-1.7  . Anxiety   . Depression   . Postmenopausal bleeding 06/12/2008  . 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 UTI (lower urinary tract infection)   . IBS (irritable bowel syndrome)   . Chronic back pain   . Chronic abdominal pain   . DDD (degenerative disc disease), lumbar   . Bilateral renal cysts 01/15/2009    Qualifier: Diagnosis of  By: Tyrell Antonio MD, [place]    . Diabetes mellitus without complication   . History of palpitations     evaluated recently 12'15  . Colitis    Past Surgical History  Procedure Laterality Date  . Cardiac surgery      to repair congenital defect as a child- 82months old   Family History  Problem Relation Age of Onset  . Stroke Father   . Heart attack Father     Had MI in his 82s  . Stomach cancer Paternal Grandmother   . Diabetes Maternal Grandmother   . Cerebral palsy Daughter   . Anesthesia problems Neg Hx   . Hypotension Neg Hx   . Malignant hyperthermia Neg Hx   . Pseudochol deficiency  Neg Hx    History  Substance Use Topics  . Smoking status: Never Smoker   . Smokeless tobacco: Never Used  . Alcohol Use: No   OB History    Gravida Para Term Preterm AB TAB SAB Ectopic Multiple Living   3 1 1  2  2   1      Review of Systems  All systems reviewed and negative, other than as noted in HPI.   Allergies  Review of patient's allergies indicates no known allergies.  Home Medications   Prior to Admission medications   Medication Sig Start Date End Date Taking? Authorizing Provider  acetaminophen (TYLENOL) 500 MG tablet Take 500-1,000 mg by mouth every 6 (six) hours as needed for moderate pain.   Yes Historical Provider, MD  enalapril (VASOTEC) 10 MG tablet Take 1 tablet (10 mg total) by mouth daily. 06/08/14  Yes Juliet Rude, MD  famotidine (PEPCID) 40 MG tablet Take 40 mg by mouth daily.   Yes Historical Provider, MD  Insulin Glargine (LANTUS SOLOSTAR) 100 UNIT/ML Solostar Pen Inject 22 Units into the skin daily at 10 pm. 06/10/14  Yes Carly J Rivet, MD  pioglitazone (ACTOS) 15 MG tablet Take 2 tablets (30 mg total) by mouth daily. Patient taking differently: Take 15 mg by mouth daily.  08/26/14  Yes Carly J Rivet, MD  simvastatin (ZOCOR) 40 MG tablet TAKE 1 TABLET (40 MG TOTAL) BY MOUTH AT BEDTIME. Patient taking differently: Take 40 mg by mouth at bedtime.  11/26/13  Yes [name], MD   BP 166/72 mmHg  Pulse 90  Temp(Src) 98.3 F (36.8 C) (Oral)  Resp 18  SpO2 100% Physical Exam  Constitutional: She appears well-developed and well-nourished. No distress.  HENT:  Head: Normocephalic and atraumatic.  Eyes: Conjunctivae are normal. Right eye exhibits no discharge. Left eye exhibits no discharge.  Neck: Neck supple.  Cardiovascular: Normal rate, regular rhythm and normal heart sounds.  Exam reveals no gallop and no friction rub.   No murmur heard. Pulmonary/Chest: Effort normal and breath sounds normal. No respiratory distress.  Abdominal: Soft. She  exhibits no distension. There is no tenderness.  Musculoskeletal: She exhibits no edema or tenderness.  Neurological: She is alert.  Skin: Skin is warm and dry.  Psychiatric: Her behavior is normal. Thought content normal.  Anxious. Somewhat pressured speech.   Nursing note and vitals reviewed.   ED Course  Procedures (including critical care time) Labs Review Labs Reviewed  URINALYSIS, ROUTINE W REFLEX MICROSCOPIC (NOT AT [facility]) - Abnormal; Notable for the following:    Leukocytes, UA MODERATE (*)    All other components within normal limits  URINE MICROSCOPIC-ADD ON - Abnormal; Notable for the following:    Squamous Epithelial / LPF FEW (*)    All other components within normal limits    Imaging Review No results found.   EKG Interpretation None      MDM   Final diagnoses:  Abdominal discomfort    59yF with "warm feeling" in R abdomen. Benign exam. Afebrile. HD stable. No specific urinary complaints. UA not convincing for UTI. Doubt serious process. It has been determined that no acute conditions requiring further emergency intervention are present at this time. The patient has been advised of the diagnosis and plan. I reviewed any labs and imaging including any potential incidental findings. We have discussed signs and symptoms that warrant return to the ED and they are listed in the discharge instructions.     Virgel Manifold, MD 08/29/14 [phone]

## 2014-08-27 NOTE — Telephone Encounter (Signed)
Talked with pt - has not had a BM - pt would not tell me how many days.  States she had some apple juice today - with no results. States she is having pain right abd again - GI appt 09/05/14. Pt denies tenderness when she touchs right abd. Pt prefers not to go to ER. Suggest for pt to try fleets enema now for BM. If pain right abd gets worse - go to ER. Hilda Blades Tasfia Vasseur RN 08/27/14 3:14PM

## 2014-08-30 ENCOUNTER — Encounter (HOSPITAL_COMMUNITY): Payer: Self-pay | Admitting: *Deleted

## 2014-08-30 DIAGNOSIS — E119 Type 2 diabetes mellitus without complications: Secondary | ICD-10-CM | POA: Insufficient documentation

## 2014-08-30 DIAGNOSIS — K5900 Constipation, unspecified: Secondary | ICD-10-CM | POA: Insufficient documentation

## 2014-08-30 DIAGNOSIS — Z8742 Personal history of other diseases of the female genital tract: Secondary | ICD-10-CM | POA: Insufficient documentation

## 2014-08-30 DIAGNOSIS — E785 Hyperlipidemia, unspecified: Secondary | ICD-10-CM | POA: Insufficient documentation

## 2014-08-30 DIAGNOSIS — N183 Chronic kidney disease, stage 3 (moderate): Secondary | ICD-10-CM | POA: Insufficient documentation

## 2014-08-30 DIAGNOSIS — Z794 Long term (current) use of insulin: Secondary | ICD-10-CM | POA: Insufficient documentation

## 2014-08-30 DIAGNOSIS — I129 Hypertensive chronic kidney disease with stage 1 through stage 4 chronic kidney disease, or unspecified chronic kidney disease: Secondary | ICD-10-CM | POA: Insufficient documentation

## 2014-08-30 DIAGNOSIS — Z8659 Personal history of other mental and behavioral disorders: Secondary | ICD-10-CM | POA: Insufficient documentation

## 2014-08-30 DIAGNOSIS — Z87440 Personal history of urinary (tract) infections: Secondary | ICD-10-CM | POA: Insufficient documentation

## 2014-08-30 DIAGNOSIS — Z862 Personal history of diseases of the blood and blood-forming organs and certain disorders involving the immune mechanism: Secondary | ICD-10-CM | POA: Insufficient documentation

## 2014-08-30 DIAGNOSIS — Z79899 Other long term (current) drug therapy: Secondary | ICD-10-CM | POA: Insufficient documentation

## 2014-08-30 DIAGNOSIS — G8929 Other chronic pain: Secondary | ICD-10-CM | POA: Insufficient documentation

## 2014-08-30 LAB — CBC
HCT: 33.5 % — ABNORMAL LOW (ref 36.0–46.0)
HEMOGLOBIN: 11.4 g/dL — AB (ref 12.0–15.0)
MCH: 30.2 pg (ref 26.0–34.0)
MCHC: 34 g/dL (ref 30.0–36.0)
MCV: 88.6 fL (ref 78.0–100.0)
Platelets: 217 10*3/uL (ref 150–400)
RBC: 3.78 MIL/uL — AB (ref 3.87–5.11)
RDW: 12.6 % (ref 11.5–15.5)
WBC: 6.2 10*3/uL (ref 4.0–10.5)

## 2014-08-30 LAB — COMPREHENSIVE METABOLIC PANEL
ALK PHOS: 98 U/L (ref 38–126)
ALT: 14 U/L (ref 14–54)
AST: 21 U/L (ref 15–41)
Albumin: 4 g/dL (ref 3.5–5.0)
Anion gap: 11 (ref 5–15)
BUN: 33 mg/dL — ABNORMAL HIGH (ref 6–20)
CALCIUM: 8.8 mg/dL — AB (ref 8.9–10.3)
CHLORIDE: 93 mmol/L — AB (ref 101–111)
CO2: 26 mmol/L (ref 22–32)
Creatinine, Ser: 1.74 mg/dL — ABNORMAL HIGH (ref 0.44–1.00)
GFR calc Af Amer: 36 mL/min — ABNORMAL LOW (ref >60)
GFR calc non Af Amer: 31 mL/min — ABNORMAL LOW (ref >60)
Glucose, Bld: 265 mg/dL — ABNORMAL HIGH (ref 65–99)
Potassium: 4.6 mmol/L (ref 3.5–5.1)
Sodium: 130 mmol/L — ABNORMAL LOW (ref 135–145)
TOTAL PROTEIN: 6.9 g/dL (ref 6.5–8.1)
Total Bilirubin: 0.3 mg/dL (ref 0.3–1.2)

## 2014-08-30 LAB — LIPASE, BLOOD: LIPASE: 17 U/L — AB (ref 22–51)

## 2014-08-30 NOTE — ED Notes (Signed)
The pt is c/o abd pain all day no bm for weeks and she has some rt shoulder pain for several days.  No n or v

## 2014-08-31 ENCOUNTER — Emergency Department (HOSPITAL_COMMUNITY)
Admission: EM | Admit: 2014-08-31 | Discharge: 2014-08-31 | Disposition: A | Discharge status: Home / Self Care | Payer: No Typology Code available for payment source | Attending: Emergency Medicine | Admitting: Emergency Medicine

## 2014-08-31 DIAGNOSIS — K5900 Constipation, unspecified: Secondary | ICD-10-CM

## 2014-08-31 LAB — URINALYSIS, ROUTINE W REFLEX MICROSCOPIC
BILIRUBIN URINE: NEGATIVE
Glucose, UA: NEGATIVE mg/dL
Hgb urine dipstick: NEGATIVE
Ketones, ur: NEGATIVE mg/dL
NITRITE: NEGATIVE
PROTEIN: NEGATIVE mg/dL
Specific Gravity, Urine: 1.005 (ref 1.005–1.030)
UROBILINOGEN UA: 0.2 mg/dL (ref 0.0–1.0)
pH: 6 (ref 5.0–8.0)

## 2014-08-31 LAB — URINE MICROSCOPIC-ADD ON

## 2014-08-31 MED ORDER — POLYETHYLENE GLYCOL 3350 17 G PO PACK: 17.0000 g | PACK | Freq: Every day | ORAL | Status: DC

## 2014-08-31 MED ORDER — DOCUSATE SODIUM 100 MG PO CAPS: 100.0000 mg | ORAL_CAPSULE | Freq: Two times a day (BID) | ORAL | Status: DC

## 2014-08-31 NOTE — Discharge Instructions (Signed)
I recommend you use MiraLAX once a day in a large glass of water. Also recommend use Colace 100 mg twice a day for your constipation.   Constipation Constipation is when a person has fewer than three bowel movements a week, has difficulty having a bowel movement, or has stools that are dry, hard, or larger than normal. As people grow older, constipation is more common. If you try to fix constipation with medicines that make you have a bowel movement (laxatives), the problem may get worse. Long-term laxative use may cause the muscles of the colon to become weak. A low-fiber diet, not taking in enough fluids, and taking certain medicines may make constipation worse.  CAUSES   Certain medicines, such as antidepressants, pain medicine, iron supplements, antacids, and water pills.   Certain diseases, such as diabetes, irritable bowel syndrome (IBS), thyroid disease, or depression.   Not drinking enough water.   Not eating enough fiber-rich foods.   Stress or travel.   Lack of physical activity or exercise.   Ignoring the urge to have a bowel movement.   Using laxatives too much.  SIGNS AND SYMPTOMS   Having fewer than three bowel movements a week.   Straining to have a bowel movement.   Having stools that are hard, dry, or larger than normal.   Feeling full or bloated.   Pain in the lower abdomen.   Not feeling relief after having a bowel movement.  DIAGNOSIS  Your health care provider will take a medical history and perform a physical exam. Further testing may be done for severe constipation. Some tests may include:  A barium enema X-ray to examine your rectum, colon, and, sometimes, your small intestine.   A sigmoidoscopy to examine your lower colon.   A colonoscopy to examine your entire colon. TREATMENT  Treatment will depend on the severity of your constipation and what is causing it. Some dietary treatments include drinking more fluids and eating more  fiber-rich foods. Lifestyle treatments may include regular exercise. If these diet and lifestyle recommendations do not help, your health care provider may recommend taking over-the-counter laxative medicines to help you have bowel movements. Prescription medicines may be prescribed if over-the-counter medicines do not work.  HOME CARE INSTRUCTIONS   Eat foods that have a lot of fiber, such as fruits, vegetables, whole grains, and beans.  Limit foods high in fat and processed sugars, such as french fries, hamburgers, cookies, candies, and soda.   A fiber supplement may be added to your diet if you cannot get enough fiber from foods.   Drink enough fluids to keep your urine clear or pale yellow.   Exercise regularly or as directed by your health care provider.   Go to the restroom when you have the urge to go. Do not hold it.   Only take over-the-counter or prescription medicines as directed by your health care provider. Do not take other medicines for constipation without talking to your health care provider first.  [place] IF:   You have bright red blood in your stool.   Your constipation lasts for more than 4 days or gets worse.   You have abdominal or rectal pain.   You have thin, pencil-like stools.   You have unexplained weight loss. MAKE SURE YOU:   Understand these instructions.  Will watch your condition.  Will get help right away if you are not doing well or get worse. Document Released: 10/23/2003 Document Revised: 01/29/2013 Document Reviewed: 11/05/2012  Exit[employer] Patient Information 2015 [place]. This information is not intended to replace advice given to you by your health care provider. Make sure you discuss any questions you have with your health care provider.   High-Fiber Diet Fiber is found in fruits, vegetables, and grains. A high-fiber diet encourages the addition of more whole grains, legumes, fruits, and vegetables in  your diet. The recommended amount of fiber for adult males is 38 g per day. For adult females, it is 25 g per day. Pregnant and lactating women should get 28 g of fiber per day. If you have a digestive or bowel problem, ask your caregiver for advice before adding high-fiber foods to your diet. Eat a variety of high-fiber foods instead of only a select few type of foods.  PURPOSE  To increase stool bulk.  To make bowel movements more regular to prevent constipation.  To lower cholesterol.  To prevent overeating. WHEN IS THIS DIET USED?  It may be used if you have constipation and hemorrhoids.  It may be used if you have uncomplicated diverticulosis (intestine condition) and irritable bowel syndrome.  It may be used if you need help with weight management.  It may be used if you want to add it to your diet as a protective measure against atherosclerosis, diabetes, and cancer. SOURCES OF FIBER  Whole-grain breads and cereals.  Fruits, such as apples, oranges, bananas, berries, prunes, and pears.  Vegetables, such as green peas, carrots, sweet potatoes, beets, broccoli, cabbage, spinach, and artichokes.  Legumes, such split peas, soy, lentils.  Almonds. FIBER CONTENT IN FOODS Starches and Grains / Dietary Fiber (g)  Cheerios, 1 cup / 3 g  Corn Flakes cereal, 1 cup / 0.7 g  Rice crispy treat cereal, 1 cup / 0.3 g  Instant oatmeal (cooked),  cup / 2 g  Frosted wheat cereal, 1 cup / 5.1 g  Brown, long-grain rice (cooked), 1 cup / 3.5 g  White, long-grain rice (cooked), 1 cup / 0.6 g  Enriched macaroni (cooked), 1 cup / 2.5 g Legumes / Dietary Fiber (g)  Baked beans (canned, plain, or vegetarian),  cup / 5.2 g  Kidney beans (canned),  cup / 6.8 g  Pinto beans (cooked),  cup / 5.5 g Breads and Crackers / Dietary Fiber (g)  Plain or honey graham crackers, 2 squares / 0.7 g  Saltine crackers, 3 squares / 0.3 g  Plain, salted pretzels, 10 pieces / 1.8  g  Whole-wheat bread, 1 slice / 1.9 g  White bread, 1 slice / 0.7 g  Raisin bread, 1 slice / 1.2 g  Plain bagel, 3 oz / 2 g  Flour tortilla, 1 oz / 0.9 g  Corn tortilla, 1 small / 1.5 g  Hamburger or hotdog bun, 1 small / 0.9 g Fruits / Dietary Fiber (g)  Apple with skin, 1 medium / 4.4 g  Sweetened applesauce,  cup / 1.5 g  Banana,  medium / 1.5 g  Grapes, 10 grapes / 0.4 g  Orange, 1 small / 2.3 g  Raisin, 1.5 oz / 1.6 g  Melon, 1 cup / 1.4 g Vegetables / Dietary Fiber (g)  Green beans (canned),  cup / 1.3 g  Carrots (cooked),  cup / 2.3 g  Broccoli (cooked),  cup / 2.8 g  Peas (cooked),  cup / 4.4 g  Mashed potatoes,  cup / 1.6 g  Lettuce, 1 cup / 0.5 g  Corn (canned),  cup / 1.6 g  Tomato,  cup / 1.1 g Document Released: 01/24/2005 Document Revised: 07/26/2011 Document Reviewed: 04/28/2011 [facility] Patient Information 2015 [place], [place]. This information is not intended to replace advice given to you by your health care provider. Make sure you discuss any questions you have with your health care provider.

## 2014-08-31 NOTE — ED Provider Notes (Signed)
TIME SEEN: 4:15 AM  CHIEF COMPLAINT: Constipation  HPI: Pt is a 59 y.o. female with history of hypertension, diabetes, hyperlipidemia, anxiety who is frequently here in the emergency department for multiple different complaints. Reports she has not had a bowel movement in one week. Denies nausea, vomiting. No abdominal pain or distention. Is passing gas. No prior history of abdominal surgery. States she has tried magnesium citrate once and had passed a small amount of stool. Has not tried anything else at home.  ROS: See HPI Constitutional: no fever  Eyes: no drainage  ENT: no runny nose   Cardiovascular:  no chest pain  Resp: no SOB  GI: no vomiting GU: no dysuria Integumentary: no rash  Allergy: no hives  Musculoskeletal: no leg swelling  Neurological: no slurred speech ROS otherwise negative  PAST MEDICAL HISTORY/PAST SURGICAL HISTORY:  Past Medical History  Diagnosis Date  . Diabetes mellitus type II, uncontrolled   . Hypertension   . Hyperlipidemia   . Ovarian cyst, left   . Anemia     due to menorrhagia, BL 8-10  . Vaginal cyst     nabothian and bartholin  . CKD (chronic kidney disease) stage 3, GFR 30-59 ml/min     baseline creatinine 1.4-1.7  . Anxiety   . Depression   . Postmenopausal bleeding 06/12/2008  . 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 UTI (lower urinary tract infection)   . IBS (irritable bowel syndrome)   . Chronic back pain   . Chronic abdominal pain   . DDD (degenerative disc disease), lumbar   . Bilateral renal cysts 01/15/2009    Qualifier: Diagnosis of  By: Tyrell Antonio MD, [place]    . Diabetes mellitus without complication   . History of palpitations     evaluated recently 12'15  . Colitis     MEDICATIONS:  Prior to Admission medications   Medication Sig Start Date End Date Taking? Authorizing Provider  acetaminophen (TYLENOL) 500 MG tablet Take 500-1,000 mg by mouth every 6 (six) hours as needed for moderate pain.     Historical Provider, MD  enalapril (VASOTEC) 10 MG tablet Take 1 tablet (10 mg total) by mouth daily. 06/08/14   Juliet Rude, MD  famotidine (PEPCID) 40 MG tablet Take 40 mg by mouth daily.    Historical Provider, MD  Insulin Glargine (LANTUS SOLOSTAR) 100 UNIT/ML Solostar Pen Inject 22 Units into the skin daily at 10 pm. 06/10/14   Juliet Rude, MD  pioglitazone (ACTOS) 15 MG tablet Take 2 tablets (30 mg total) by mouth daily. Patient taking differently: Take 15 mg by mouth daily.  08/26/14   Juliet Rude, MD  simvastatin (ZOCOR) 40 MG tablet TAKE 1 TABLET (40 MG TOTAL) BY MOUTH AT BEDTIME. Patient taking differently: Take 40 mg by mouth at bedtime.  11/26/13   Ejiroghene Arlyce Dice, MD    ALLERGIES:  No Known Allergies  SOCIAL HISTORY:  History  Substance Use Topics  . Smoking status: Never Smoker   . Smokeless tobacco: Never Used  . Alcohol Use: No    FAMILY HISTORY: Family History  Problem Relation Age of Onset  . Stroke Father   . Heart attack Father     Had MI in his 67s  . Stomach cancer Paternal Grandmother   . Diabetes Maternal Grandmother   . Cerebral palsy Daughter   . Anesthesia problems Neg Hx   . Hypotension Neg Hx   . Malignant  hyperthermia Neg Hx   . Pseudochol deficiency Neg Hx     EXAM: BP 151/69 mmHg  Pulse 92  Temp(Src) 98.8 F (37.1 C) (Oral)  Resp 19  Ht 5\' 6"  (1.676 m)  Wt 167 lb 9 oz (76.006 kg)  BMI 27.06 kg/m2  SpO2 98% CONSTITUTIONAL: Alert and oriented and responds appropriately to questions. Well-appearing; well-nourished, afebrile, nontoxic HEAD: Normocephalic EYES: Conjunctivae clear, PERRL ENT: normal nose; no rhinorrhea; moist mucous membranes; pharynx without lesions noted NECK: Supple, no meningismus, no LAD  CARD: RRR; S1 and S2 appreciated; no murmurs, no clicks, no rubs, no gallops RESP: Normal chest excursion without splinting or tachypnea; breath sounds clear and equal bilaterally; no wheezes, no rhonchi, no rales, no hypoxia  or respiratory distress, speaking full sentences ABD/GI: Normal bowel sounds; non-distended; soft, non-tender, no rebound, no guarding, no peritoneal signs RECTAL:  Small soft stool in the rectal vault but no fecal impaction, no gross blood or melena, normal rectal tone BACK:  The back appears normal and is non-tender to palpation, there is no CVA tenderness EXT: Normal ROM in all joints; non-tender to palpation; no edema; normal capillary refill; no cyanosis, no calf tenderness or swelling    SKIN: Normal color for age and race; warm NEURO: Moves all extremities equally, sensation to light touch intact diffusely, cranial nerves II through XII intact PSYCH: The patient's mood and manner are appropriate. Grooming and personal hygiene are appropriate.  MEDICAL DECISION MAKING: Patient here with constipation. Her abdominal exam is completely benign. She is afebrile and hemodynamically stable. Labs performed in triage are unremarkable. I do not feel she needs emergent imaging of her abdomen. She has no distention, normal bowel sounds and is passing gas. No prior history of bowel obstruction or abdominal surgery. Doubt bowel obstruction today. Have recommended MiraLAX and Colace, increase water and fiber intake. Patient is very resistant to trying any of these medications because she states she cannot afford them. Discussed with patient I can provide her prescriptions for the same which may make them cheaper that she is still resistant to any recommendations that I give her. Discussed with patient I do not feel an enema will be helpful today in the emergency department because she does not have but only a very small amount of stool in her rectal vault. Have discussed that one dose of MiraLAX and Colace in the emergency department are not can resolve her constipation and she needs to continue being on these medications. I discussed with her return precautions and supportive care instructions. She verbalized  understanding.       Prado Ver[place], DO 08/31/14 [phone]

## 2014-09-01 ENCOUNTER — Telehealth: Payer: Self-pay | Admitting: Internal Medicine

## 2014-09-01 NOTE — Telephone Encounter (Signed)
Patient states that her right shoulder is hurting and she read something somewhere that it could come from her gallbladder. She wasn't to talk to someone about it.

## 2014-09-01 NOTE — Progress Notes (Signed)
Internal Medicine Clinic Attending  Case discussed with Dr. Rivet soon after the resident saw the patient.  We reviewed the resident's history and exam and pertinent patient test results.  I agree with the assessment, diagnosis, and plan of care documented in the resident's note.  

## 2014-09-01 NOTE — Telephone Encounter (Signed)
Called pt, informed her she would need to speak w/ md about what could cause her shoulder pain, appt given for 7/26 at 1415 dr rivet

## 2014-09-02 ENCOUNTER — Encounter (HOSPITAL_COMMUNITY): Payer: Self-pay | Admitting: Emergency Medicine

## 2014-09-02 ENCOUNTER — Emergency Department (HOSPITAL_COMMUNITY): Payer: No Typology Code available for payment source

## 2014-09-02 ENCOUNTER — Encounter: Payer: Self-pay | Admitting: Internal Medicine

## 2014-09-02 ENCOUNTER — Emergency Department (HOSPITAL_COMMUNITY)
Admission: EM | Admit: 2014-09-02 | Discharge: 2014-09-02 | Disposition: A | Discharge status: Home / Self Care | Payer: No Typology Code available for payment source | Attending: Emergency Medicine | Admitting: Emergency Medicine

## 2014-09-02 ENCOUNTER — Ambulatory Visit (INDEPENDENT_AMBULATORY_CARE_PROVIDER_SITE_OTHER): Payer: No Typology Code available for payment source | Admitting: Internal Medicine

## 2014-09-02 VITALS — BP 152/87 | HR 96 | Temp 97.8°F | Ht 65.0 in | Wt 164.4 lb

## 2014-09-02 DIAGNOSIS — N183 Chronic kidney disease, stage 3 unspecified: Secondary | ICD-10-CM

## 2014-09-02 DIAGNOSIS — M546 Pain in thoracic spine: Secondary | ICD-10-CM

## 2014-09-02 DIAGNOSIS — I129 Hypertensive chronic kidney disease with stage 1 through stage 4 chronic kidney disease, or unspecified chronic kidney disease: Secondary | ICD-10-CM | POA: Insufficient documentation

## 2014-09-02 DIAGNOSIS — M549 Dorsalgia, unspecified: Secondary | ICD-10-CM

## 2014-09-02 DIAGNOSIS — E785 Hyperlipidemia, unspecified: Secondary | ICD-10-CM | POA: Insufficient documentation

## 2014-09-02 DIAGNOSIS — K5900 Constipation, unspecified: Secondary | ICD-10-CM | POA: Insufficient documentation

## 2014-09-02 DIAGNOSIS — Q6102 Congenital multiple renal cysts: Secondary | ICD-10-CM | POA: Insufficient documentation

## 2014-09-02 DIAGNOSIS — Z862 Personal history of diseases of the blood and blood-forming organs and certain disorders involving the immune mechanism: Secondary | ICD-10-CM | POA: Insufficient documentation

## 2014-09-02 DIAGNOSIS — Z8742 Personal history of other diseases of the female genital tract: Secondary | ICD-10-CM | POA: Insufficient documentation

## 2014-09-02 DIAGNOSIS — D649 Anemia, unspecified: Secondary | ICD-10-CM | POA: Insufficient documentation

## 2014-09-02 DIAGNOSIS — Z8659 Personal history of other mental and behavioral disorders: Secondary | ICD-10-CM | POA: Insufficient documentation

## 2014-09-02 DIAGNOSIS — Z794 Long term (current) use of insulin: Secondary | ICD-10-CM | POA: Insufficient documentation

## 2014-09-02 DIAGNOSIS — G8929 Other chronic pain: Secondary | ICD-10-CM | POA: Insufficient documentation

## 2014-09-02 DIAGNOSIS — M25511 Pain in right shoulder: Secondary | ICD-10-CM

## 2014-09-02 DIAGNOSIS — E119 Type 2 diabetes mellitus without complications: Secondary | ICD-10-CM | POA: Insufficient documentation

## 2014-09-02 DIAGNOSIS — E1122 Type 2 diabetes mellitus with diabetic chronic kidney disease: Secondary | ICD-10-CM

## 2014-09-02 DIAGNOSIS — E871 Hypo-osmolality and hyponatremia: Secondary | ICD-10-CM

## 2014-09-02 DIAGNOSIS — Z8774 Personal history of (corrected) congenital malformations of heart and circulatory system: Secondary | ICD-10-CM | POA: Insufficient documentation

## 2014-09-02 DIAGNOSIS — Z79899 Other long term (current) drug therapy: Secondary | ICD-10-CM | POA: Insufficient documentation

## 2014-09-02 DIAGNOSIS — I10 Essential (primary) hypertension: Secondary | ICD-10-CM

## 2014-09-02 DIAGNOSIS — Z87440 Personal history of urinary (tract) infections: Secondary | ICD-10-CM | POA: Insufficient documentation

## 2014-09-02 MED ORDER — CAPSAICIN-MENTHOL-METHYL SAL 0.025-1-12 % EX CREA: 1.0000 "application " | TOPICAL_CREAM | Freq: Two times a day (BID) | CUTANEOUS | Status: DC

## 2014-09-02 MED ORDER — METHOCARBAMOL 500 MG PO TABS: 500.0000 mg | ORAL_TABLET | Freq: Two times a day (BID) | ORAL | Status: DC | PRN

## 2014-09-02 NOTE — ED Notes (Signed)
Explained to patient that her X-rays were normal and that she could view them on her my chart; RN showed her instructions in her d/c paperwork. RN went over and explained to patient the d/c paperwork and RX but she insisted that she 'wasn't faking it'. Patient insured that no one thought or said that she was faking it. Pt left and refused to sign for paperwork/discharge.

## 2014-09-02 NOTE — ED Notes (Signed)
Pt reports intermittent scapula pain since June. No trouble with shoulder ROM. Took acetaminophen prescribed for shoulder pain,  But pain not better.

## 2014-09-02 NOTE — Discharge Instructions (Signed)

## 2014-09-02 NOTE — Patient Instructions (Signed)
  Right shoulder pain - Increase Tylenol to 2 pills three times daily - Try using icy-hot patches - Apply hot compresses - Apply capsaicin cream twice daily  General Instructions:   Please bring your medicines with you each time you come to clinic.  Medicines may include prescription medications, over-the-counter medications, herbal remedies, eye drops, vitamins, or other pills.   Progress Toward Treatment Goals:  Treatment Goal 08/26/2014  Hemoglobin A1C unable to assess  Blood pressure deteriorated    Self Care Goals & Plans:  Self Care Goal 09/02/2014  Manage my medications take my medicines as prescribed; bring my medications to every visit; refill my medications on time; follow the sick day instructions if I am sick  Monitor my health keep track of my blood glucose; bring my glucose meter and log to each visit; keep track of my blood pressure; check my feet daily  Eat healthy foods eat more vegetables; eat fruit for snacks and desserts; eat baked foods instead of fried foods; eat foods that are low in salt; eat smaller portions; drink diet soda or water instead of juice or soda  Be physically active find an activity I enjoy  Meeting treatment goals -    Home Blood Glucose Monitoring 08/26/2014  Check my blood sugar 2 times a day  When to check my blood sugar before breakfast; before dinner     Care Management & Community Referrals:  Referral 06/17/2014  Referrals made for care management support financial counselor

## 2014-09-02 NOTE — ED Provider Notes (Signed)
CSN: [id number]     Arrival date & time 09/02/14  1750 History  This chart was scribed for non-physician practitioner, Waynetta Pean, PA-C working with Sherwood Gambler, MD, by Chester Holstein, ED Scribe. This patient was seen in room [place] and the patient's care was started at 7:53 PM.     Chief Complaint  Patient presents with  . Shoulder Pain     HPI HPI Comments: [name] is a 59 y.o. female with PMHx of DM, HTN, HLD, CKD, anemia, DDD who presents to the Emergency Department complaining of sharp intermitted upper right back pain with onset in June. She reports her pain is worse with movement. She has taken tylenol today without relief.  Pt was seen today by PCP for same, but described pain to right shoulder. She was instructed to increase Tylenol dosage to 2 pills t.i.d., use icy hot patches, hot compresses, and prescribed capsaicin cream b.i.d. for relief. She states the Tylenol has given no relief. She also reports she is constipated. Her last BM was 2 days ago. She states she has MiraLAX at home but has not used for relief. Pt was last seen in ED on 08/31/14 for constipation and was instructed to take MiraLAX and Colace. She denies known injury, fever, chills, abdominal pain, nausea, vomiting, weakness, numbness, and bowel/bladder incontinence.   Past Medical History  Diagnosis Date  . Diabetes mellitus type II, uncontrolled   . Hypertension   . Hyperlipidemia   . Ovarian cyst, left   . Anemia     due to menorrhagia, BL 8-10  . Vaginal cyst     nabothian and bartholin  . CKD (chronic kidney disease) stage 3, GFR 30-59 ml/min     baseline creatinine 1.4-1.7  . Anxiety   . Depression   . Postmenopausal bleeding 06/12/2008  . 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 UTI (lower urinary tract infection)   . IBS (irritable bowel syndrome)   . Chronic back pain   . Chronic abdominal pain   . DDD (degenerative disc disease), lumbar   . Bilateral renal  cysts 01/15/2009    Qualifier: Diagnosis of  By: Tyrell Antonio MD, [place]    . Diabetes mellitus without complication   . History of palpitations     evaluated recently 12'15  . Colitis    Past Surgical History  Procedure Laterality Date  . Cardiac surgery      to repair congenital defect as a child- 69months old   Family History  Problem Relation Age of Onset  . Stroke Father   . Heart attack Father     Had MI in his 6s  . Stomach cancer Paternal Grandmother   . Diabetes Maternal Grandmother   . Cerebral palsy Daughter   . Anesthesia problems Neg Hx   . Hypotension Neg Hx   . Malignant hyperthermia Neg Hx   . Pseudochol deficiency Neg Hx    History  Substance Use Topics  . Smoking status: Never Smoker   . Smokeless tobacco: Never Used  . Alcohol Use: No   OB History    Gravida Para Term Preterm AB TAB SAB Ectopic Multiple Living   3 1 1  2  2   1      Review of Systems  Constitutional: Negative for fever and chills.  Cardiovascular: Negative for chest pain.  Gastrointestinal: Positive for constipation. Negative for nausea, vomiting, abdominal pain, diarrhea and bowel incontinence.  Genitourinary: Negative for bladder incontinence,  dysuria and difficulty urinating.  Musculoskeletal: Positive for myalgias and back pain. Negative for gait problem, neck pain and neck stiffness.  Skin: Negative for rash and wound.  Neurological: Negative for weakness and numbness.      Allergies  Review of patient's allergies indicates no known allergies.  Home Medications   Prior to Admission medications   Medication Sig Start Date End Date Taking? Authorizing Provider  acetaminophen (TYLENOL) 500 MG tablet Take 500-1,000 mg by mouth every 6 (six) hours as needed for moderate pain.    Historical Provider, MD  Capsaicin-Menthol-Methyl Sal (CAPSAICIN-METHYL SAL-MENTHOL) 0.025-1-12 % CREA Apply 1 application topically 2 (two) times daily. Apply to the affected area 09/02/14   Juliet Rude,  MD  docusate sodium (COLACE) 100 MG capsule Take 1 capsule (100 mg total) by mouth every 12 (twelve) hours. 08/31/14   Kristen N Ward, DO  enalapril (VASOTEC) 10 MG tablet Take 1 tablet (10 mg total) by mouth daily. 06/08/14   Juliet Rude, MD  famotidine (PEPCID) 40 MG tablet Take 40 mg by mouth daily.    Historical Provider, MD  Insulin Glargine (LANTUS SOLOSTAR) 100 UNIT/ML Solostar Pen Inject 22 Units into the skin daily at 10 pm. 06/10/14   Juliet Rude, MD  methocarbamol (ROBAXIN) 500 MG tablet Take 1 tablet (500 mg total) by mouth 2 (two) times daily as needed for muscle spasms. 09/02/14   Waynetta Pean, PA-C  pioglitazone (ACTOS) 15 MG tablet Take 2 tablets (30 mg total) by mouth daily. Patient taking differently: Take 15 mg by mouth daily.  08/26/14   Carly Montey Hora, MD  polyethylene glycol (MIRALAX / GLYCOLAX) packet Take 17 g by mouth daily. 08/31/14   Kristen N Ward, DO  simvastatin (ZOCOR) 40 MG tablet TAKE 1 TABLET (40 MG TOTAL) BY MOUTH AT BEDTIME. Patient taking differently: Take 40 mg by mouth at bedtime.  11/26/13   [name], MD   BP 149/78 mmHg  Pulse 85  Temp(Src) 98.6 F (37 C) (Oral)  Resp 16  SpO2 96% Physical Exam  Constitutional: She is oriented to person, place, and time. She appears well-developed and well-nourished. No distress.  Nontoxic appearing.  HENT:  Head: Normocephalic and atraumatic.  Eyes: Conjunctivae are normal. Right eye exhibits no discharge. Left eye exhibits no discharge.  Neck: Neck supple.  Cardiovascular: Normal rate, regular rhythm and intact distal pulses.   Pulmonary/Chest: Effort normal. No respiratory distress.  Abdominal: Soft. There is no tenderness. There is no guarding.  Musculoskeletal:  No midline back tenderness. No back edema, erythema, deformities or ecchymosis noted. Tenderness to right rhomboids, no scapula pain, no bony point-tenderness. No right shoulder pain. Normal strength and range of motion of her right  shoulder.   Lymphadenopathy:    She has no cervical adenopathy.  Neurological: She is alert and oriented to person, place, and time. Coordination normal.  Sensation is intact or bilateral upper and lower extremities. The patient is able to ambulate in the room without difficulty or assistance with normal gait.  Skin: Skin is warm and dry. No rash noted. She is not diaphoretic. No erythema. No pallor.  Psychiatric: She has a normal mood and affect. Her behavior is normal.  Nursing note and vitals reviewed.   ED Course  Procedures (including critical care time) DIAGNOSTIC STUDIES: Oxygen Saturation is 96% on room air, normal by my interpretation.    COORDINATION OF CARE: 8:00 PM Discussed treatment plan with patient at beside, the patient agrees with  the plan and has no further questions at this time.    Labs Review Labs Reviewed - No data to display  Imaging Review No results found.   EKG Interpretation None      Filed Vitals:   09/02/14 1834  BP: 149/78  Pulse: 85  Temp: 98.6 F (37 C)  TempSrc: Oral  Resp: 16  SpO2: 96%     MDM   Meds given in ED:  Medications - No data to display  New Prescriptions   METHOCARBAMOL (ROBAXIN) 500 MG TABLET    Take 1 tablet (500 mg total) by mouth 2 (two) times daily as needed for muscle spasms.    Final diagnoses:  Upper back pain on right side   This is a 59 year old female who presents to the emergency department complaining of intermittent right-sided upper back pain intermittently for the past month. Patient denies fevers, chills, numbness, tingling, weakness, loss of bladder control, loss of bowel control, or injury. On exam patient is afebrile and nontoxic appearing. Patient has tenderness to her rhomboids on her right side. There is no scapula pain. There is no bony point tenderness. Patient has full range of motion of her right shoulder. Patient has full strength of her right shoulder and arm. There is no midline back  tenderness. There is no back edema, ecchymosis, deformity or erythema. Patient has no focal neurological deficits. She is able to ambulate in the room without difficulty or assistance. Patient was evaluated by her primary care provider earlier today and prescribed an increased dose of Tylenol. Plan is to discharge a prescription for Robaxin and have her follow-up with her PCP. Patient is requesting x-ray of her shoulder. Will obtain x-ray and plan for discharge. At shift change patient is awaiting x-ray. Patient care signed out to [facility], PA-C who will disposition the patient.   I personally performed the services described in this documentation, which was scribed in my presence. The recorded information has been reviewed and is accurate.     Waynetta Pean, PA-C 09/02/14 2017  Sherwood Gambler, MD 09/08/14 [phone]

## 2014-09-03 ENCOUNTER — Ambulatory Visit: Payer: No Typology Code available for payment source | Admitting: Internal Medicine

## 2014-09-03 ENCOUNTER — Encounter (HOSPITAL_COMMUNITY): Payer: Self-pay | Admitting: *Deleted

## 2014-09-03 ENCOUNTER — Other Ambulatory Visit: Payer: Self-pay

## 2014-09-03 ENCOUNTER — Emergency Department (HOSPITAL_COMMUNITY)
Admission: EM | Admit: 2014-09-03 | Discharge: 2014-09-03 | Disposition: A | Discharge status: Home / Self Care | Payer: Self-pay | Attending: Emergency Medicine | Admitting: Emergency Medicine

## 2014-09-03 ENCOUNTER — Telehealth: Payer: Self-pay | Admitting: Internal Medicine

## 2014-09-03 ENCOUNTER — Emergency Department (HOSPITAL_COMMUNITY): Payer: No Typology Code available for payment source

## 2014-09-03 DIAGNOSIS — I129 Hypertensive chronic kidney disease with stage 1 through stage 4 chronic kidney disease, or unspecified chronic kidney disease: Secondary | ICD-10-CM | POA: Insufficient documentation

## 2014-09-03 DIAGNOSIS — Z8742 Personal history of other diseases of the female genital tract: Secondary | ICD-10-CM | POA: Insufficient documentation

## 2014-09-03 DIAGNOSIS — R109 Unspecified abdominal pain: Secondary | ICD-10-CM | POA: Insufficient documentation

## 2014-09-03 DIAGNOSIS — E871 Hypo-osmolality and hyponatremia: Secondary | ICD-10-CM | POA: Insufficient documentation

## 2014-09-03 DIAGNOSIS — E119 Type 2 diabetes mellitus without complications: Secondary | ICD-10-CM | POA: Insufficient documentation

## 2014-09-03 DIAGNOSIS — R1110 Vomiting, unspecified: Secondary | ICD-10-CM | POA: Insufficient documentation

## 2014-09-03 DIAGNOSIS — Z79899 Other long term (current) drug therapy: Secondary | ICD-10-CM | POA: Insufficient documentation

## 2014-09-03 DIAGNOSIS — Z794 Long term (current) use of insulin: Secondary | ICD-10-CM | POA: Insufficient documentation

## 2014-09-03 DIAGNOSIS — M549 Dorsalgia, unspecified: Secondary | ICD-10-CM | POA: Insufficient documentation

## 2014-09-03 DIAGNOSIS — Z862 Personal history of diseases of the blood and blood-forming organs and certain disorders involving the immune mechanism: Secondary | ICD-10-CM | POA: Insufficient documentation

## 2014-09-03 DIAGNOSIS — F419 Anxiety disorder, unspecified: Secondary | ICD-10-CM | POA: Insufficient documentation

## 2014-09-03 DIAGNOSIS — F329 Major depressive disorder, single episode, unspecified: Secondary | ICD-10-CM | POA: Insufficient documentation

## 2014-09-03 DIAGNOSIS — G8929 Other chronic pain: Secondary | ICD-10-CM | POA: Insufficient documentation

## 2014-09-03 DIAGNOSIS — N183 Chronic kidney disease, stage 3 (moderate): Secondary | ICD-10-CM | POA: Insufficient documentation

## 2014-09-03 DIAGNOSIS — N390 Urinary tract infection, site not specified: Secondary | ICD-10-CM | POA: Insufficient documentation

## 2014-09-03 LAB — CBC WITH DIFFERENTIAL/PLATELET
Basophils Absolute: 0 10*3/uL (ref 0.0–0.1)
Basophils Relative: 0 % (ref 0–1)
Eosinophils Absolute: 0 10*3/uL (ref 0.0–0.7)
Eosinophils Relative: 0 % (ref 0–5)
HEMATOCRIT: 31.3 % — AB (ref 36.0–46.0)
HEMOGLOBIN: 11.2 g/dL — AB (ref 12.0–15.0)
LYMPHS PCT: 22 % (ref 12–46)
Lymphs Abs: 1.5 10*3/uL (ref 0.7–4.0)
MCH: 30.4 pg (ref 26.0–34.0)
MCHC: 35.8 g/dL (ref 30.0–36.0)
MCV: 85.1 fL (ref 78.0–100.0)
MONOS PCT: 4 % (ref 3–12)
Monocytes Absolute: 0.3 10*3/uL (ref 0.1–1.0)
Neutro Abs: 5.3 10*3/uL (ref 1.7–7.7)
Neutrophils Relative %: 74 % (ref 43–77)
Platelets: 232 10*3/uL (ref 150–400)
RBC: 3.68 MIL/uL — ABNORMAL LOW (ref 3.87–5.11)
RDW: 12.2 % (ref 11.5–15.5)
WBC: 7.1 10*3/uL (ref 4.0–10.5)

## 2014-09-03 LAB — URINALYSIS, ROUTINE W REFLEX MICROSCOPIC
Bilirubin Urine: NEGATIVE
Glucose, UA: 100 mg/dL — AB
Hgb urine dipstick: NEGATIVE
KETONES UR: NEGATIVE mg/dL
Leukocytes, UA: NEGATIVE
Nitrite: NEGATIVE
PH: 5.5 (ref 5.0–8.0)
Protein, ur: NEGATIVE mg/dL
SPECIFIC GRAVITY, URINE: 1.009 (ref 1.005–1.030)
UROBILINOGEN UA: 0.2 mg/dL (ref 0.0–1.0)

## 2014-09-03 LAB — COMPREHENSIVE METABOLIC PANEL
ALT: 16 U/L (ref 14–54)
AST: 23 U/L (ref 15–41)
Albumin: 4.5 g/dL (ref 3.5–5.0)
Alkaline Phosphatase: 75 U/L (ref 38–126)
Anion gap: 13 (ref 5–15)
BUN: 22 mg/dL — ABNORMAL HIGH (ref 6–20)
CO2: 23 mmol/L (ref 22–32)
Calcium: 9.2 mg/dL (ref 8.9–10.3)
Chloride: 84 mmol/L — ABNORMAL LOW (ref 101–111)
Creatinine, Ser: 1.82 mg/dL — ABNORMAL HIGH (ref 0.44–1.00)
GFR calc Af Amer: 34 mL/min — ABNORMAL LOW (ref >60)
GFR, EST NON AFRICAN AMERICAN: 30 mL/min — AB (ref >60)
Glucose, Bld: 247 mg/dL — ABNORMAL HIGH (ref 65–99)
Potassium: 4.7 mmol/L (ref 3.5–5.1)
Sodium: 120 mmol/L — ABNORMAL LOW (ref 135–145)
TOTAL PROTEIN: 7.7 g/dL (ref 6.5–8.1)
Total Bilirubin: 1.4 mg/dL — ABNORMAL HIGH (ref 0.3–1.2)

## 2014-09-03 LAB — BASIC METABOLIC PANEL
Anion gap: 13 (ref 5–15)
BUN: 20 mg/dL (ref 6–20)
CHLORIDE: 86 mmol/L — AB (ref 101–111)
CO2: 25 mmol/L (ref 22–32)
Calcium: 8.7 mg/dL — ABNORMAL LOW (ref 8.9–10.3)
Creatinine, Ser: 1.7 mg/dL — ABNORMAL HIGH (ref 0.44–1.00)
GFR, EST AFRICAN AMERICAN: 37 mL/min — AB (ref >60)
GFR, EST NON AFRICAN AMERICAN: 32 mL/min — AB (ref >60)
Glucose, Bld: 185 mg/dL — ABNORMAL HIGH (ref 65–99)
POTASSIUM: 3.9 mmol/L (ref 3.5–5.1)
SODIUM: 124 mmol/L — AB (ref 135–145)

## 2014-09-03 LAB — LIPASE, BLOOD: LIPASE: 14 U/L — AB (ref 22–51)

## 2014-09-03 MED ORDER — FENTANYL CITRATE (PF) 100 MCG/2ML IJ SOLN
100.0000 ug | Freq: Once | INTRAMUSCULAR | Status: AC
  Administered 2014-09-03: 100 ug via INTRAVENOUS
  Filled 2014-09-03: qty 2

## 2014-09-03 MED ORDER — ONDANSETRON HCL 4 MG/2ML IJ SOLN
4.0000 mg | Freq: Once | INTRAMUSCULAR | Status: AC
  Administered 2014-09-03: 4 mg via INTRAVENOUS
  Filled 2014-09-03: qty 2

## 2014-09-03 MED ORDER — SODIUM CHLORIDE 0.9 % IV SOLN
Freq: Once | INTRAVENOUS | Status: AC
  Administered 2014-09-03: 15:00:00 via INTRAVENOUS

## 2014-09-03 MED ORDER — ONDANSETRON 4 MG PO TBDP
4.0000 mg | ORAL_TABLET | Freq: Once | ORAL | Status: AC | PRN
  Administered 2014-09-03: 4 mg via ORAL

## 2014-09-03 MED ORDER — ONDANSETRON 4 MG PO TBDP
ORAL_TABLET | ORAL | Status: AC
  Filled 2014-09-03: qty 1

## 2014-09-03 NOTE — Assessment & Plan Note (Signed)
BP Readings from Last 3 Encounters:  09/03/14 166/89  09/02/14 149/78  09/02/14 152/87    Lab Results  Component Value Date   NA 130* 08/30/2014   K 4.6 08/30/2014   CREATININE 1.74* 08/30/2014    Assessment: Blood pressure control: mildly elevated Progress toward BP goal:  unchanged Comments: BP elevated. Question whether patient taking Enalapril.   Plan: Medications:  Continue Enalapril 10 mg daily. Patient refusing to increase dose to 20 mg. Stressed medication compliance.  Educational resources provided: brochure, handout, video

## 2014-09-03 NOTE — ED Notes (Signed)
Pt leaving for Ultrasound

## 2014-09-03 NOTE — ED Provider Notes (Signed)
CSN: [id number]     Arrival date & time 09/03/14  1059 History   First MD Initiated Contact with Patient 09/03/14 1225     Chief Complaint  Patient presents with  . Abdominal Pain  . Emesis     (Consider location/radiation/quality/duration/timing/severity/associated sxs/prior Treatment) Patient is a 59 y.o. female presenting with vomiting.  Emesis Severity:  Moderate Duration:  1 day Able to tolerate:  Liquids Progression:  Improving Chronicity:  New Recent urination:  Normal Context: not post-tussive   Relieved by:  Nothing Worsened by:  Nothing tried Ineffective treatments:  None tried Associated symptoms: abdominal pain   Associated symptoms: no chills, no cough and no diarrhea   Risk factors: no alcohol use     Past Medical History  Diagnosis Date  . Diabetes mellitus type II, uncontrolled   . Hypertension   . Hyperlipidemia   . Ovarian cyst, left   . Anemia     due to menorrhagia, BL 8-10  . Vaginal cyst     nabothian and bartholin  . CKD (chronic kidney disease) stage 3, GFR 30-59 ml/min     baseline creatinine 1.4-1.7  . Anxiety   . Depression   . Postmenopausal bleeding 06/12/2008  . 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 UTI (lower urinary tract infection)   . IBS (irritable bowel syndrome)   . Chronic back pain   . Chronic abdominal pain   . DDD (degenerative disc disease), lumbar   . Bilateral renal cysts 01/15/2009    Qualifier: Diagnosis of  By: Tyrell Antonio MD, [place]    . Diabetes mellitus without complication   . History of palpitations     evaluated recently 12'15  . Colitis    Past Surgical History  Procedure Laterality Date  . Cardiac surgery      to repair congenital defect as a child- 35months old   Family History  Problem Relation Age of Onset  . Stroke Father   . Heart attack Father     Had MI in his 14s  . Stomach cancer Paternal Grandmother   . Diabetes Maternal Grandmother   . Cerebral palsy Daughter   .  Anesthesia problems Neg Hx   . Hypotension Neg Hx   . Malignant hyperthermia Neg Hx   . Pseudochol deficiency Neg Hx    History  Substance Use Topics  . Smoking status: Never Smoker   . Smokeless tobacco: Never Used  . Alcohol Use: No   OB History    Gravida Para Term Preterm AB TAB SAB Ectopic Multiple Living   3 1 1  2  2   1      Review of Systems  Constitutional: Negative for chills.  Gastrointestinal: Positive for vomiting and abdominal pain. Negative for diarrhea.  All other systems reviewed and are negative.     Allergies  Review of patient's allergies indicates no known allergies.  Home Medications   Prior to Admission medications   Medication Sig Start Date End Date Taking? Authorizing Provider  acetaminophen (TYLENOL) 500 MG tablet Take 500-1,000 mg by mouth every 6 (six) hours as needed for moderate pain.   Yes Historical Provider, MD  Capsaicin-Menthol-Methyl Sal (CAPSAICIN-METHYL SAL-MENTHOL) 0.025-1-12 % CREA Apply 1 application topically 2 (two) times daily. Apply to the affected area 09/02/14  Yes Carly J Rivet, MD  docusate sodium (COLACE) 100 MG capsule Take 1 capsule (100 mg total) by mouth every 12 (twelve) hours. 08/31/14  Yes Delice Bison  Ward, DO  enalapril (VASOTEC) 10 MG tablet Take 1 tablet (10 mg total) by mouth daily. 06/08/14  Yes Juliet Rude, MD  famotidine (PEPCID) 40 MG tablet Take 40 mg by mouth daily.   Yes Historical Provider, MD  Insulin Glargine (LANTUS SOLOSTAR) 100 UNIT/ML Solostar Pen Inject 22 Units into the skin daily at 10 pm. 06/10/14  Yes Carly Montey Hora, MD  methocarbamol (ROBAXIN) 500 MG tablet Take 1 tablet (500 mg total) by mouth 2 (two) times daily as needed for muscle spasms. 09/02/14  Yes Waynetta Pean, PA-C  pioglitazone (ACTOS) 15 MG tablet Take 2 tablets (30 mg total) by mouth daily. Patient taking differently: Take 15 mg by mouth daily.  08/26/14  Yes Carly Montey Hora, MD  polyethylene glycol (MIRALAX / GLYCOLAX) packet Take 17 g by  mouth daily. 08/31/14  Yes Kristen N Ward, DO  simvastatin (ZOCOR) 40 MG tablet TAKE 1 TABLET (40 MG TOTAL) BY MOUTH AT BEDTIME. Patient taking differently: Take 40 mg by mouth at bedtime.  11/26/13  Yes [name], MD   BP 129/72 mmHg  Pulse 81  Temp(Src) 97.7 F (36.5 C) (Oral)  Resp 17  Ht 5\' 6"  (1.676 m)  Wt 164 lb (74.39 kg)  BMI 26.48 kg/m2  SpO2 98% Physical Exam  Constitutional: She is oriented to person, place, and time. She appears well-developed and well-nourished.  HENT:  Head: Normocephalic and atraumatic.  Eyes: Conjunctivae and EOM are normal. Right eye exhibits no discharge. Left eye exhibits no discharge.  Cardiovascular: Normal rate and regular rhythm.   Pulmonary/Chest: Effort normal and breath sounds normal. No respiratory distress.  Abdominal: Soft. She exhibits no distension. There is no tenderness. There is no rebound.  Musculoskeletal: Normal range of motion. She exhibits no edema or tenderness.  Neurological: She is alert and oriented to person, place, and time.  No altered mental status, able to give full seemingly accurate history.  Face is symmetric, EOM's intact, pupils equal and reactive, vision intact, tongue and uvula midline without deviation Upper and Lower extremity motor 5/5, intact pain perception in distal extremities, 2+ reflexes in biceps, patella and achilles tendons. Walks without assistance or evident ataxia.   Skin: Skin is warm and dry.  Nursing note and vitals reviewed.   ED Course  Procedures (including critical care time) Labs Review Labs Reviewed  LIPASE, BLOOD - Abnormal; Notable for the following:    Lipase 14 (*)    All other components within normal limits  COMPREHENSIVE METABOLIC PANEL - Abnormal; Notable for the following:    Sodium 120 (*)    Chloride 84 (*)    Glucose, Bld 247 (*)    BUN 22 (*)    Creatinine, Ser 1.82 (*)    Total Bilirubin 1.4 (*)    GFR calc non Af Amer 30 (*)    GFR calc Af Amer 34 (*)     All other components within normal limits  URINALYSIS, ROUTINE W REFLEX MICROSCOPIC (NOT AT [facility]) - Abnormal; Notable for the following:    Glucose, UA 100 (*)    All other components within normal limits  CBC WITH DIFFERENTIAL/PLATELET - Abnormal; Notable for the following:    RBC 3.68 (*)    Hemoglobin 11.2 (*)    HCT 31.3 (*)    All other components within normal limits  BASIC METABOLIC PANEL - Abnormal; Notable for the following:    Sodium 124 (*)    Chloride 86 (*)    Glucose,  Bld 185 (*)    Creatinine, Ser 1.70 (*)    Calcium 8.7 (*)    GFR calc non Af Amer 32 (*)    GFR calc Af Amer 37 (*)    All other components within normal limits    Imaging Review Dg Shoulder Right  09/02/2014   CLINICAL DATA:  RIGHT shoulder pain for 2 months.  No known injury.  EXAM: RIGHT SHOULDER - 2+ VIEW  COMPARISON:  07/29/2014.  FINDINGS: There is no evidence of fracture or dislocation. Tiny calcification just superior to the clavicle above the [facility] joint is redemonstrated and stable. Soft tissues unremarkable.  IMPRESSION: Stable exam. Small calcification with superior to the distal RIGHT clavicle could represent residua of old trauma.   Electronically Signed   By: Staci Righter M.D.   On: 09/02/2014 21:04   US Abdomen Limited  09/03/2014   CLINICAL DATA:  Right-sided abdominal pain for 6 months.  EXAM: US ABDOMEN LIMITED - RIGHT UPPER QUADRANT  COMPARISON:  None.  FINDINGS: Gallbladder:  No gallstones or wall thickening visualized. No sonographic Murphy sign noted.  Common bile duct:  Diameter: 4.7 mm which is within normal limits.  Liver:  No focal lesion identified. Within normal limits in parenchymal echogenicity.  IMPRESSION: No abnormality seen in the right upper quadrant of the abdomen.   Electronically Signed   By: Marijo Conception, M.D.   On: 09/03/2014 15:56     EKG Interpretation   Date/Time:  Wednesday September 03 2014 14:29:14 EDT Ventricular Rate:  86 PR Interval:  182 QRS Duration:  120 QT Interval:  379 QTC Calculation: 453 R Axis:   -5 Text Interpretation:  Sinus rhythm Incomplete RBBB and LAFB Minimal ST  elevation, anterior leads Baseline wander in lead(s) V6 Left axis  deviation Confirmed by Lincy Belles MD, Corene Cornea [phone]) on 09/03/2014 2:40:24 PM      MDM   Final diagnoses:  Abdominal pain  Hyponatremia   59 year old female with vomiting and nonbloody nonbilious, diarrhea reoccurred, right shoulder and stomach warmth. Exam relatively benign as documented above. Ever has a bilirubin of 1.4 no other elevated liver enzymes. With the right shoulder pain and vomiting and also the nature she didn't have cholecystitis which was found to be normal. She did have a sodium of 120 which was 123 corrected. She already had A liter of fluids and she is tolerating by mouth now so we will recheck another metabolic panel and if her sodium is normal closer to 130 she can likely be discharged with primary care follow-up as she has not had any neurologic symptoms nor does she have any neurologic signs here..  Corrected sodium on repeat is 125.5. Tolerating PO, no obvious weakness or other ill effects. Shared decision making with patient and she prefers to go home. Warning signs given for severe hyponatremia, advised short sting of high salt diet and PCP follow up on Monday for recheck.   I have personally and contemperaneously reviewed labs and imaging and used in my decision making as above.   A medical screening exam was performed and I feel the patient has had an appropriate workup for their chief complaint at this time and likelihood of emergent condition existing is low. They have been counseled on decision, discharge, follow up and which symptoms necessitate immediate return to the emergency department. They or their family verbally stated understanding and agreement with plan and discharged in stable condition.      Merrily Pew, MD 09/03/14 [phone]

## 2014-09-03 NOTE — Telephone Encounter (Signed)
Talked with pt - prefers not to come this AM - appt made with Dr Naaman Plummer today 09/03/14 1:15PM.

## 2014-09-03 NOTE — Assessment & Plan Note (Signed)
Patient presented complaining of right shoulder pain but her pain seems to be more located in her right upper back. Pain seems musculoskeletal in nature as she has tenderness to palpation on exam. She has no pain or limitations with ROM. Likely a muscle strain (possibly subscapularis). Very difficult to assist patient as she states she cannot afford any medications. Will recommend conservative treatment. Recommended: - icy-hot patches - increase tylenol 650 mg to TID - warm compresses - capsaicin cream to be applied BID  OF NOTE: PATIENT WENT TO ED AFTER MY APPOINTMENT WITH HER. ED provider got an x-ray of the right shoulder which showed a stable small calcification superior to the distal RIGHT clavicle likely representing residua of old trauma. She was also given prescription for Robaxin.

## 2014-09-03 NOTE — ED Notes (Signed)
Notified phlebotomy that bolus is complete.

## 2014-09-03 NOTE — ED Notes (Signed)
Pt reports having abd pain that started last night, having n/v, denies diarrhea. Recent hx of same.

## 2014-09-03 NOTE — Progress Notes (Signed)
[place] [facility] [phone]  Spoke with patient regarding her [facility] orange card with [facility]. Patient expressed several concerns in regards to her pcp and overall health. A [facility] case manager was offered to help with concerns after discharge, pt refused. Patient sts she will follow up with her pcp once discharged. My contact information provided for any future questions or concerns. No other [place] Specialist needs identified at this time.

## 2014-09-03 NOTE — Progress Notes (Signed)
   Subjective:    Patient ID: [name], female    DOB: 12/29/1955, 59 y.o.   MRN: [id number]  HPI [name] is a 59yo woman with PMHx of HTN, Type 2 DM, CKD Stage 3, and hyperlipidemia who presents today for acute right shoulder pain.   She states her shoulder pain started 2 days ago after she took a lot of trash out to her garbage dumpster. She describes the pain as 6-7/10 in severity, sharp and achy, and located more in the upper right back. She does not remember lifting anything too heavy. She has been using Tylenol 650 mg 2 pills twice a day for the last 2 days with minimal relief. She denies any pain with movement of her right arm. She denies any falls.   Please see A&P documentation for other medical problems.   Review of Systems General: Denies fever, chills, night sweats, changes in weight, changes in appetite HEENT: Denies headaches, ear pain, changes in vision, rhinorrhea, sore throat CV: Denies CP, palpitations, SOB, orthopnea Pulm: Denies SOB, cough, wheezing GI: Reports constipation. Denies abdominal pain, nausea, vomiting, diarrhea, melena, hematochezia GU: Denies dysuria, hematuria, frequency Msk: See HPI Neuro: Denies weakness, numbness, tingling Skin: Denies rashes, bruising    Objective:   Physical Exam General: sitting up in chair, pressured speech, anxious  HEENT: [place]/AT, EOMI, sclera anicteric, mucus membranes moist CV: RRR, no m/g/r Pulm: CTA bilaterally, breaths non-labored Abd: BS+, soft, non-tender Back: Mild tenderness to palpation of the right upper back at level of subscapularis muscle. No erythema or swelling present. No bony abnormalities.  Ext: warm, no peripheral edema. No limitations in ROM in upper or lower extremities. No tenderness to palpation of R shoulder.  Neuro: alert and oriented x 3, no focal deficits     Assessment & Plan:  Refer to A&P documentation

## 2014-09-03 NOTE — ED Notes (Signed)
Patient returned from Ultrasound. 

## 2014-09-03 NOTE — Telephone Encounter (Signed)
   Reason for call:   I received a call from [name] at 7:00  AM indicating that she was having abdominal pain.   Pertinent Data:   She was seen by her PCP yesterday c/o right shoulder pain and advised to take tylenol.  She then left that appointment and went to the ED where she was diagnosed with a rhomboid strain and perscribed robaxin.  But she reports she has not taken this, it seems she does not trust the diagnosis.  She does now report a new symptom of abdominal pain but does not describe it well to me, only that it hurts and "I'm not crazy."  She does report one episdoe of emesis.  She denies fever or chills.   Assessment / Plan / Recommendations:   I discussed with here that there are appointments avaliable this morning in clinic if she would like to be evaluated.  She does not feel she can drive.  In that case I recommened if she is having severe abdominal pain she should call EMS and be taken to the ED.  She does not apparently want to do that either.  She says she will wait about an hour to see how she feels.  I told her I would have our triage nurses call her back when the clinic opens in about an hour.  The number I reached her at was [phone]   Lucious Groves, DO   09/03/2014, 7:14 AM

## 2014-09-03 NOTE — Assessment & Plan Note (Signed)
Lab Results  Component Value Date   HGBA1C 8.3 08/05/2014   HGBA1C 8.7 04/29/2014   HGBA1C 9.1 01/06/2014     Assessment: Diabetes control: fair control Progress toward A1C goal:  unable to assess Comments: Blood sugars better in AM with CBGs in 70s-200s. Only one low recorded which is an improvement. Few dinner CBGs recorded but all above 240. Patient admits to not taking Actos consistently.   Plan: Medications:  Continue Lantus 22 units QHS and Actos 30 mg daily. I stressed the importance of taking her Actos consistently.  Home glucose monitoring: Frequency: 2 times a day Timing: before breakfast, before dinner Instruction/counseling given: reminded to bring blood glucose meter & log to each visit and reminded to bring medications to each visit Educational resources provided: brochure, handout

## 2014-09-04 ENCOUNTER — Telehealth: Payer: Self-pay | Admitting: Licensed Clinical Social Worker

## 2014-09-04 ENCOUNTER — Telehealth: Payer: Self-pay | Admitting: Internal Medicine

## 2014-09-04 ENCOUNTER — Encounter (HOSPITAL_COMMUNITY): Payer: Self-pay | Admitting: *Deleted

## 2014-09-04 ENCOUNTER — Telehealth: Payer: Self-pay | Admitting: *Deleted

## 2014-09-04 ENCOUNTER — Emergency Department (HOSPITAL_COMMUNITY)
Admission: EM | Admit: 2014-09-04 | Discharge: 2014-09-05 | Disposition: A | Discharge status: Home / Self Care | Payer: No Typology Code available for payment source | Attending: Emergency Medicine | Admitting: Emergency Medicine

## 2014-09-04 DIAGNOSIS — E119 Type 2 diabetes mellitus without complications: Secondary | ICD-10-CM | POA: Insufficient documentation

## 2014-09-04 DIAGNOSIS — Z8719 Personal history of other diseases of the digestive system: Secondary | ICD-10-CM | POA: Insufficient documentation

## 2014-09-04 DIAGNOSIS — G8929 Other chronic pain: Secondary | ICD-10-CM | POA: Insufficient documentation

## 2014-09-04 DIAGNOSIS — R112 Nausea with vomiting, unspecified: Secondary | ICD-10-CM | POA: Insufficient documentation

## 2014-09-04 DIAGNOSIS — F419 Anxiety disorder, unspecified: Secondary | ICD-10-CM | POA: Insufficient documentation

## 2014-09-04 DIAGNOSIS — Z87440 Personal history of urinary (tract) infections: Secondary | ICD-10-CM | POA: Insufficient documentation

## 2014-09-04 DIAGNOSIS — E871 Hypo-osmolality and hyponatremia: Secondary | ICD-10-CM

## 2014-09-04 DIAGNOSIS — Z8742 Personal history of other diseases of the female genital tract: Secondary | ICD-10-CM | POA: Insufficient documentation

## 2014-09-04 DIAGNOSIS — Z794 Long term (current) use of insulin: Secondary | ICD-10-CM | POA: Insufficient documentation

## 2014-09-04 DIAGNOSIS — R109 Unspecified abdominal pain: Secondary | ICD-10-CM | POA: Insufficient documentation

## 2014-09-04 DIAGNOSIS — N183 Chronic kidney disease, stage 3 (moderate): Secondary | ICD-10-CM | POA: Insufficient documentation

## 2014-09-04 DIAGNOSIS — Z8774 Personal history of (corrected) congenital malformations of heart and circulatory system: Secondary | ICD-10-CM | POA: Insufficient documentation

## 2014-09-04 DIAGNOSIS — Z79899 Other long term (current) drug therapy: Secondary | ICD-10-CM | POA: Insufficient documentation

## 2014-09-04 DIAGNOSIS — R197 Diarrhea, unspecified: Secondary | ICD-10-CM | POA: Insufficient documentation

## 2014-09-04 DIAGNOSIS — F329 Major depressive disorder, single episode, unspecified: Secondary | ICD-10-CM | POA: Insufficient documentation

## 2014-09-04 DIAGNOSIS — E785 Hyperlipidemia, unspecified: Secondary | ICD-10-CM | POA: Insufficient documentation

## 2014-09-04 DIAGNOSIS — I129 Hypertensive chronic kidney disease with stage 1 through stage 4 chronic kidney disease, or unspecified chronic kidney disease: Secondary | ICD-10-CM | POA: Insufficient documentation

## 2014-09-04 DIAGNOSIS — Z862 Personal history of diseases of the blood and blood-forming organs and certain disorders involving the immune mechanism: Secondary | ICD-10-CM | POA: Insufficient documentation

## 2014-09-04 LAB — CBC
HCT: 33.5 % — ABNORMAL LOW (ref 36.0–46.0)
Hemoglobin: 11.9 g/dL — ABNORMAL LOW (ref 12.0–15.0)
MCH: 30 pg (ref 26.0–34.0)
MCHC: 35.5 g/dL (ref 30.0–36.0)
MCV: 84.4 fL (ref 78.0–100.0)
PLATELETS: 234 10*3/uL (ref 150–400)
RBC: 3.97 MIL/uL (ref 3.87–5.11)
RDW: 12.1 % (ref 11.5–15.5)
WBC: 8.8 10*3/uL (ref 4.0–10.5)

## 2014-09-04 LAB — COMPREHENSIVE METABOLIC PANEL
ALK PHOS: 72 U/L (ref 38–126)
ALT: 19 U/L (ref 14–54)
AST: 30 U/L (ref 15–41)
Albumin: 4.7 g/dL (ref 3.5–5.0)
Anion gap: 13 (ref 5–15)
BILIRUBIN TOTAL: 0.9 mg/dL (ref 0.3–1.2)
BUN: 20 mg/dL (ref 6–20)
CHLORIDE: 80 mmol/L — AB (ref 101–111)
CO2: 25 mmol/L (ref 22–32)
Calcium: 8.8 mg/dL — ABNORMAL LOW (ref 8.9–10.3)
Creatinine, Ser: 1.75 mg/dL — ABNORMAL HIGH (ref 0.44–1.00)
GFR calc Af Amer: 36 mL/min — ABNORMAL LOW (ref >60)
GFR, EST NON AFRICAN AMERICAN: 31 mL/min — AB (ref >60)
Glucose, Bld: 251 mg/dL — ABNORMAL HIGH (ref 65–99)
Potassium: 4.2 mmol/L (ref 3.5–5.1)
SODIUM: 118 mmol/L — AB (ref 135–145)
Total Protein: 8 g/dL (ref 6.5–8.1)

## 2014-09-04 NOTE — Telephone Encounter (Signed)
Pt aware Dr Arcelia Jew suggestion till appt.

## 2014-09-04 NOTE — Telephone Encounter (Signed)
Was she able to get capsaicin cream or icy-hot patches? I do not think sports medicine is warranted right now until she completes all conservative treatment. Her right shoulder pain is likely musculoskeletal and will resolve in the next week.

## 2014-09-04 NOTE — Telephone Encounter (Signed)
Pt called - did go to ER recently 09/03/14. Still having right shoulder pain and states Tylenol is not helping. Did a warm washcloth to area times one - little relief. Hilda Blades Judeen Geralds RN 09/04/14 10:45AM

## 2014-09-04 NOTE — ED Notes (Addendum)
Pt reports she was seen for same at [facility] ED yesterday and was given 1 bag of IVF, reports she is not getting any better.  Pt reports decreased appetite and vomiting earlier today.  Pt reports she just wants her Na checked because it was low yesterday.  It was 124

## 2014-09-04 NOTE — Telephone Encounter (Signed)
[name] was referred to CSW as [name] has had multiple ED visits immediately following an [facility] office visit.  CSW placed call to [name] to obtain feedback and if [name] was satisfied with [facility].  [name] states at time she is unsure with care received because of [facility] being a residency program.  CSW offered to provide referral to a non-residency clinic, [name] declined stating other clinics have been rude to her in the past and she is overall pleased with [facility].  [name] states "I'm not getting what I need."  When attempts to inquire as to what [name] feels is lacking, [name] states "there's is something wrong with me.  My daughter is smart and thinks it my low sodium."  [name] denies shoulder pain is muscular stating she has not done anything to cause strain.  [name] continues to state she has not been eating lately, denies financial issue with obtaining food.  [name] complains of lack of appetite and vomiting, improvement in "bathroom over the past 24-48 hours". [name] states she has an appointment with a GI next week.  [name] did speak with nursing this morning.    [name] voiced complaint that "she won't give me my labs.".  CSW unable to determine who the "she" to which [name] was referring.  CSW informed [name] of chart information available online through [employer].  [name] aware of MyChart and does not want to use it.  [name] is the primary caregiver to her 68 year old daughter who is diagnosed with CP.  Daughter receives no services in the home.  [name] declines need to have any services in the home stating "I'm going to do it as long as I can."  CSW discussed potential stress involved with being the primary caregiver to a loved one, [name] states "I have to do what I got to do."  [name] aware transportation is available, but declines as she still drives her own car.  [name] has applied for disability and is currently appealing determination.  [name] has not applied for Adult Medicaid.  CSW offered referral to [facility] to  assist with community care and possible assistance with connecting with resources available. [name] hesitant.  CSW will continue to encourage linkage with [facility] for community care management and continue to encourage [name] to apply for adult medicaid as [name] would have access to more services.

## 2014-09-04 NOTE — Telephone Encounter (Signed)
  Reason for call:   I placed an outgoing call to Ms. Maximino Greenland at 10:10PM.  She was concerned because her sodium was low yesterday at 124.  She denies any confusion or other neurological symptoms.  States she has not been eating well.  In fact she has called several providers over the past few days for the same complaints.  She has called the on call after hours pager multiple times for various complaints.  I feel that she needs some education and guidance on the appropriate use of the after-hours paging system.     Assessment/ Plan:   Hyponatremia: Likely d/t volume depletion.  I advised her to try and rehydrate herself by drinking gatorade, etc.  She apparently was in the [facility] when I was talking with her because she was not feeling well and wanted to have her sodium rechecked.  She will cont to be evaluated in the [facility].    As always, pt is advised that if symptoms worsen or new symptoms arise, they should go to an urgent care facility or to to ER for further evaluation.   Jones Bales, MD   09/04/2014, 10:07 PM

## 2014-09-05 ENCOUNTER — Other Ambulatory Visit: Payer: Self-pay

## 2014-09-05 ENCOUNTER — Emergency Department (HOSPITAL_COMMUNITY)
Admission: EM | Admit: 2014-09-05 | Discharge: 2014-09-05 | Disposition: A | Discharge status: Home / Self Care | Payer: No Typology Code available for payment source | Attending: Emergency Medicine | Admitting: Emergency Medicine

## 2014-09-05 ENCOUNTER — Encounter (HOSPITAL_COMMUNITY): Payer: Self-pay

## 2014-09-05 ENCOUNTER — Other Ambulatory Visit: Payer: Self-pay | Admitting: Gastroenterology

## 2014-09-05 DIAGNOSIS — Z8719 Personal history of other diseases of the digestive system: Secondary | ICD-10-CM | POA: Insufficient documentation

## 2014-09-05 DIAGNOSIS — Z8739 Personal history of other diseases of the musculoskeletal system and connective tissue: Secondary | ICD-10-CM | POA: Insufficient documentation

## 2014-09-05 DIAGNOSIS — N183 Chronic kidney disease, stage 3 (moderate): Secondary | ICD-10-CM | POA: Insufficient documentation

## 2014-09-05 DIAGNOSIS — I129 Hypertensive chronic kidney disease with stage 1 through stage 4 chronic kidney disease, or unspecified chronic kidney disease: Secondary | ICD-10-CM | POA: Insufficient documentation

## 2014-09-05 DIAGNOSIS — F419 Anxiety disorder, unspecified: Secondary | ICD-10-CM | POA: Insufficient documentation

## 2014-09-05 DIAGNOSIS — E785 Hyperlipidemia, unspecified: Secondary | ICD-10-CM | POA: Insufficient documentation

## 2014-09-05 DIAGNOSIS — Z862 Personal history of diseases of the blood and blood-forming organs and certain disorders involving the immune mechanism: Secondary | ICD-10-CM | POA: Insufficient documentation

## 2014-09-05 DIAGNOSIS — R531 Weakness: Secondary | ICD-10-CM | POA: Insufficient documentation

## 2014-09-05 DIAGNOSIS — G8929 Other chronic pain: Secondary | ICD-10-CM | POA: Insufficient documentation

## 2014-09-05 DIAGNOSIS — Z8774 Personal history of (corrected) congenital malformations of heart and circulatory system: Secondary | ICD-10-CM | POA: Insufficient documentation

## 2014-09-05 DIAGNOSIS — Z8742 Personal history of other diseases of the female genital tract: Secondary | ICD-10-CM | POA: Insufficient documentation

## 2014-09-05 DIAGNOSIS — E871 Hypo-osmolality and hyponatremia: Secondary | ICD-10-CM

## 2014-09-05 DIAGNOSIS — Z87440 Personal history of urinary (tract) infections: Secondary | ICD-10-CM | POA: Insufficient documentation

## 2014-09-05 DIAGNOSIS — R112 Nausea with vomiting, unspecified: Secondary | ICD-10-CM | POA: Insufficient documentation

## 2014-09-05 DIAGNOSIS — E119 Type 2 diabetes mellitus without complications: Secondary | ICD-10-CM | POA: Insufficient documentation

## 2014-09-05 DIAGNOSIS — Z794 Long term (current) use of insulin: Secondary | ICD-10-CM | POA: Insufficient documentation

## 2014-09-05 DIAGNOSIS — R197 Diarrhea, unspecified: Secondary | ICD-10-CM | POA: Insufficient documentation

## 2014-09-05 DIAGNOSIS — Z79899 Other long term (current) drug therapy: Secondary | ICD-10-CM | POA: Insufficient documentation

## 2014-09-05 DIAGNOSIS — R1084 Generalized abdominal pain: Secondary | ICD-10-CM | POA: Insufficient documentation

## 2014-09-05 LAB — I-STAT CHEM 8, ED
BUN: 17 mg/dL (ref 6–20)
BUN: 18 mg/dL (ref 6–20)
CHLORIDE: 87 mmol/L — AB (ref 101–111)
CHLORIDE: 86 mmol/L — AB (ref 101–111)
CREATININE: 1.7 mg/dL — AB (ref 0.44–1.00)
Calcium, Ion: 1.06 mmol/L — ABNORMAL LOW (ref 1.12–1.23)
Calcium, Ion: 1.07 mmol/L — ABNORMAL LOW (ref 1.12–1.23)
Creatinine, Ser: 1.6 mg/dL — ABNORMAL HIGH (ref 0.44–1.00)
GLUCOSE: 125 mg/dL — AB (ref 65–99)
Glucose, Bld: 98 mg/dL (ref 65–99)
HCT: 34 % — ABNORMAL LOW (ref 36.0–46.0)
HEMATOCRIT: 35 % — AB (ref 36.0–46.0)
HEMOGLOBIN: 11.6 g/dL — AB (ref 12.0–15.0)
Hemoglobin: 11.9 g/dL — ABNORMAL LOW (ref 12.0–15.0)
Potassium: 3.5 mmol/L (ref 3.5–5.1)
Potassium: 3.8 mmol/L (ref 3.5–5.1)
SODIUM: 123 mmol/L — AB (ref 135–145)
Sodium: 123 mmol/L — ABNORMAL LOW (ref 135–145)
TCO2: 21 mmol/L (ref 0–100)
TCO2: 21 mmol/L (ref 0–100)

## 2014-09-05 LAB — URINALYSIS, ROUTINE W REFLEX MICROSCOPIC
Bilirubin Urine: NEGATIVE
Glucose, UA: NEGATIVE mg/dL
HGB URINE DIPSTICK: NEGATIVE
Ketones, ur: NEGATIVE mg/dL
Nitrite: NEGATIVE
Protein, ur: NEGATIVE mg/dL
SPECIFIC GRAVITY, URINE: 1.004 — AB (ref 1.005–1.030)
Urobilinogen, UA: 0.2 mg/dL (ref 0.0–1.0)
pH: 5.5 (ref 5.0–8.0)

## 2014-09-05 LAB — RAPID URINE DRUG SCREEN, HOSP PERFORMED
AMPHETAMINES: NOT DETECTED
Barbiturates: NOT DETECTED
Benzodiazepines: NOT DETECTED
Cocaine: NOT DETECTED
OPIATES: NOT DETECTED
Tetrahydrocannabinol: NOT DETECTED

## 2014-09-05 LAB — NA AND K (SODIUM & POTASSIUM), RAND UR
Potassium Urine: 6 mmol/L
SODIUM UR: 33 mmol/L

## 2014-09-05 LAB — URINE MICROSCOPIC-ADD ON

## 2014-09-05 MED ORDER — SODIUM CHLORIDE 0.9 % IV BOLUS (SEPSIS)
1000.0000 mL | Freq: Once | INTRAVENOUS | Status: AC
  Administered 2014-09-05: 1000 mL via INTRAVENOUS

## 2014-09-05 MED ORDER — KETOROLAC TROMETHAMINE 30 MG/ML IJ SOLN
30.0000 mg | Freq: Once | INTRAMUSCULAR | Status: AC
  Administered 2014-09-05: 30 mg via INTRAVENOUS
  Filled 2014-09-05: qty 1

## 2014-09-05 NOTE — ED Notes (Addendum)
Per EMS, Pt, from home, c/o abdominal pain and n/v/d x 6 months.  Pain score 7/10.  Pt was discharged from [facility] around 0600 this morning for same.  EDP arranged an appointment w/ Gordy Levan MD, but Pt sts that she felt to bad to drive.  Pt sts "I got home, took a nap, and the pain was really bad when I woke up."      Per chart review, Pt was educated regarding follow-up appointment.  However, per RN notes, Pt reported that she would not be going to the appointment, she was still in pain, and she did not have a ride.

## 2014-09-05 NOTE — ED Notes (Signed)
Pt updated about plan. Pt also has a GI appointment today (friday) but reports "I won't go, I don't have anybody to take me". Pt was encouraged to keep all appointments and follow up regarding her abdominal pain.

## 2014-09-05 NOTE — ED Notes (Signed)
Pt reports pain is not any better.

## 2014-09-05 NOTE — ED Notes (Addendum)
Pt requesting pain medication. MD Tomi Bamberger made aware and states " I know".

## 2014-09-05 NOTE — ED Provider Notes (Addendum)
CSN: [id number]     Arrival date & time 09/04/14  2156 History  This chart was scribed for Rolland Porter, MD by Meriel Pica, ED Scribe. This patient was seen in room WA01/WA01 and the patient's care was started 2:59 AM.   Chief Complaint  Patient presents with  . decreased appetite    The history is provided by the patient. No language interpreter was used.   HPI Comments: Leigh Blas is a 59 y.o. female, with a PMhx of DM, HTN, HLD, and chronic abdominal pain, who presents to the Emergency Department complaining of worsening, intermittent, moderate abdominal pain that has been present for months. She reports associated nausea, vomiting, and 1 episode of diarrhea today. Her nausea and vomiting has been present for 3 days. She reports she has been evaluated by her GI doctor, Dr. Paulita Fujita in June but does not note the outcome of this evaluation. Pt followed by Dr. Arcelia Jew at [facility] outpatient clinic. She was evaluated in the ED 2 days ago for abdominal pain and emesis that was present for 1 day prior to this past visit. Pt was found to have hyponatremia and discharged home after receiving fluids with the instructions to follow up with her PCP the following Monday, in 4 days. Pt does not work and reports she takes care of her child. Denies impaired thought process or judgement, difficulty walking or trouble thinking. smoking tobacco, or consuming EtOH.  Pt is additionally complaining of right shoulder pain.   History is hard to obtain this patient is rapidly talking about different topics and jumps from one topic to the other.  PCP [facility]  Past Medical History  Diagnosis Date  . Diabetes mellitus type II, uncontrolled   . Hypertension   . Hyperlipidemia   . Ovarian cyst, left   . Anemia     due to menorrhagia, BL 8-10  . Vaginal cyst     nabothian and bartholin  . CKD (chronic kidney disease) stage 3, GFR 30-59 ml/min     baseline creatinine 1.4-1.7  . Anxiety   . Depression   .  Postmenopausal bleeding 06/12/2008  . 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 UTI (lower urinary tract infection)   . IBS (irritable bowel syndrome)   . Chronic back pain   . Chronic abdominal pain   . DDD (degenerative disc disease), lumbar   . Bilateral renal cysts 01/15/2009    Qualifier: Diagnosis of  By: Tyrell Antonio MD, [place]    . Diabetes mellitus without complication   . History of palpitations     evaluated recently 12'15  . Colitis    Past Surgical History  Procedure Laterality Date  . Cardiac surgery      to repair congenital defect as a child- 70months old   Family History  Problem Relation Age of Onset  . Stroke Father   . Heart attack Father     Had MI in his 68s  . Stomach cancer Paternal Grandmother   . Diabetes Maternal Grandmother   . Cerebral palsy Daughter   . Anesthesia problems Neg Hx   . Hypotension Neg Hx   . Malignant hyperthermia Neg Hx   . Pseudochol deficiency Neg Hx    History  Substance Use Topics  . Smoking status: Never Smoker   . Smokeless tobacco: Never Used  . Alcohol Use: No   Lives at home Takes care of her handicapped daughter who is also in the room  OB History    Gravida Para Term Preterm AB TAB SAB Ectopic Multiple Living   3 1 1  2  2   1      Review of Systems  Constitutional: Positive for appetite change.  Gastrointestinal: Positive for nausea, vomiting, abdominal pain and diarrhea.  Musculoskeletal: Positive for arthralgias.  All other systems reviewed and are negative.  Allergies  Review of patient's allergies indicates no known allergies.  Home Medications   Prior to Admission medications   Medication Sig Start Date End Date Taking? Authorizing Provider  acetaminophen (TYLENOL) 500 MG tablet Take 500-1,000 mg by mouth every 6 (six) hours as needed for moderate pain.   Yes Historical Provider, MD  enalapril (VASOTEC) 10 MG tablet Take 1 tablet (10 mg total) by mouth daily. 06/08/14  Yes Juliet Rude, MD  famotidine (PEPCID) 40 MG tablet Take 40 mg by mouth daily.   Yes Historical Provider, MD  glycerin adult (GLYCERIN ADULT) 2 G SUPP Place 1 suppository rectally once as needed for mild constipation or moderate constipation.   Yes Historical Provider, MD  Insulin Glargine (LANTUS SOLOSTAR) 100 UNIT/ML Solostar Pen Inject 22 Units into the skin daily at 10 pm. 06/10/14  Yes Carly J Rivet, MD  pioglitazone (ACTOS) 15 MG tablet Take 2 tablets (30 mg total) by mouth daily. Patient taking differently: Take 15 mg by mouth daily.  08/26/14  Yes [place], MD  PRESCRIPTION MEDICATION Apply 1 application topically as needed (pain). Used daughter's lidocaine cream   Yes Historical Provider, MD  simvastatin (ZOCOR) 40 MG tablet TAKE 1 TABLET (40 MG TOTAL) BY MOUTH AT BEDTIME. Patient taking differently: Take 40 mg by mouth at bedtime.  11/26/13  Yes [name], MD  Capsaicin-Menthol-Methyl Sal (CAPSAICIN-METHYL SAL-MENTHOL) 0.025-1-12 % CREA Apply 1 application topically 2 (two) times daily. Apply to the affected area Patient not taking: Reported on 09/04/2014 09/02/14   Sindy Guadeloupe Rivet, MD  docusate sodium (COLACE) 100 MG capsule Take 1 capsule (100 mg total) by mouth every 12 (twelve) hours. Patient not taking: Reported on 09/04/2014 08/31/14   Delice Bison Ward, DO  methocarbamol (ROBAXIN) 500 MG tablet Take 1 tablet (500 mg total) by mouth 2 (two) times daily as needed for muscle spasms. 09/02/14   Waynetta Pean, PA-C  polyethylene glycol (MIRALAX / GLYCOLAX) packet Take 17 g by mouth daily. 08/31/14   Kristen N Ward, DO   BP 169/81 mmHg  Pulse 93  Temp(Src) 97.8 F (36.6 C) (Oral)  Resp 18  SpO2 98%  Vital signs normal   Physical Exam  Constitutional: She is oriented to person, place, and time. She appears well-developed and well-nourished.  Non-toxic appearance. She does not appear ill. No distress.  HENT:  Head: Normocephalic and atraumatic.  Right Ear: External ear normal.  Left  Ear: External ear normal.  Nose: Nose normal. No mucosal edema or rhinorrhea.  Mouth/Throat: Oropharynx is clear and moist and mucous membranes are normal. No dental abscesses or uvula swelling.  Eyes: Conjunctivae and EOM are normal. Pupils are equal, round, and reactive to light.  Neck: Normal range of motion and full passive range of motion without pain. Neck supple.  Cardiovascular: Normal rate, regular rhythm and normal heart sounds.  Exam reveals no gallop and no friction rub.   No murmur heard. Pulmonary/Chest: Effort normal and breath sounds normal. No respiratory distress. She has no wheezes. She has no rhonchi. She has no rales. She exhibits no tenderness and no crepitus.  Abdominal: Soft. Normal appearance and bowel sounds are normal. She exhibits no distension. There is no tenderness. There is no rebound and no guarding.  Musculoskeletal: Normal range of motion. She exhibits no edema or tenderness.  Moves all extremities well.   Neurological: She is alert and oriented to person, place, and time. She has normal strength. No cranial nerve deficit.  Skin: Skin is warm, dry and intact. No rash noted. No erythema. No pallor.  Psychiatric: Her mood appears anxious. Her speech is rapid and/or pressured. She is agitated.  Nursing note and vitals reviewed.   ED Course  Procedures   Medications  sodium chloride 0.9 % bolus 1,000 mL (0 mLs Intravenous Stopped 09/05/14 0444)  sodium chloride 0.9 % bolus 1,000 mL (0 mLs Intravenous Stopped 09/05/14 0535)  ketorolac (TORADOL) 30 MG/ML injection 30 mg (30 mg Intravenous Given 09/05/14 0454)    DIAGNOSTIC STUDIES: Oxygen Saturation is 98% on RA, normal by my interpretation.    COORDINATION OF CARE: 2:59 AM Discussed treatment plan which includes to order fluids and urinalysis with pt. Pt acknowledges and agrees to plan.   04:24 Pt discussed with Dr Gordy Levan, she would prefer to not admit patient, requests we give IV fluids, repeat electrolytes  and she will arrange for her to be seen this afternoon in the [facility].   Patient is a frequent ED visitor. She has had 19 ED visits in the past 6 months. Almost all of them are either for abdominal pain or shoulder pain. Her last CT of her abdomen was June 25 showing colitis.  Labs Review Results for orders placed or performed during the hospital encounter of 09/04/14  Comprehensive metabolic panel  Result Value Ref Range   Sodium 118 (LL) 135 - 145 mmol/L   Potassium 4.2 3.5 - 5.1 mmol/L   Chloride 80 (L) 101 - 111 mmol/L   CO2 25 22 - 32 mmol/L   Glucose, Bld 251 (H) 65 - 99 mg/dL   BUN 20 6 - 20 mg/dL   Creatinine, Ser 1.75 (H) 0.44 - 1.00 mg/dL   Calcium 8.8 (L) 8.9 - 10.3 mg/dL   Total Protein 8.0 6.5 - 8.1 g/dL   Albumin 4.7 3.5 - 5.0 g/dL   AST 30 15 - 41 U/L   ALT 19 14 - 54 U/L   Alkaline Phosphatase 72 38 - 126 U/L   Total Bilirubin 0.9 0.3 - 1.2 mg/dL   GFR calc non Af Amer 31 (L) >60 mL/min   GFR calc Af Amer 36 (L) >60 mL/min   Anion gap 13 5 - 15  CBC  Result Value Ref Range   WBC 8.8 4.0 - 10.5 K/uL   RBC 3.97 3.87 - 5.11 MIL/uL   Hemoglobin 11.9 (L) 12.0 - 15.0 g/dL   HCT 33.5 (L) 36.0 - 46.0 %   MCV 84.4 78.0 - 100.0 fL   MCH 30.0 26.0 - 34.0 pg   MCHC 35.5 30.0 - 36.0 g/dL   RDW 12.1 11.5 - 15.5 %   Platelets 234 150 - 400 K/uL  Urinalysis, Routine w reflex microscopic (not at [facility])  Result Value Ref Range   Color, Urine YELLOW YELLOW   APPearance CLEAR CLEAR   Specific Gravity, Urine 1.004 (L) 1.005 - 1.030   pH 5.5 5.0 - 8.0   Glucose, UA NEGATIVE NEGATIVE mg/dL   Hgb urine dipstick NEGATIVE NEGATIVE   Bilirubin Urine NEGATIVE NEGATIVE   Ketones, ur NEGATIVE NEGATIVE mg/dL   Protein, ur NEGATIVE NEGATIVE  mg/dL   Urobilinogen, UA 0.2 0.0 - 1.0 mg/dL   Nitrite NEGATIVE NEGATIVE   Leukocytes, UA SMALL (A) NEGATIVE  Urine microscopic-add on  Result Value Ref Range   Squamous Epithelial / LPF RARE RARE   WBC, UA 3-6 <3 WBC/hpf  Urine rapid drug screen  (hosp performed)  Result Value Ref Range   Opiates NONE DETECTED NONE DETECTED   Cocaine NONE DETECTED NONE DETECTED   Benzodiazepines NONE DETECTED NONE DETECTED   Amphetamines NONE DETECTED NONE DETECTED   Tetrahydrocannabinol NONE DETECTED NONE DETECTED   Barbiturates NONE DETECTED NONE DETECTED  I-Stat Chem 8, ED  Result Value Ref Range   Sodium 123 (L) 135 - 145 mmol/L   Potassium 3.5 3.5 - 5.1 mmol/L   Chloride 87 (L) 101 - 111 mmol/L   BUN 17 6 - 20 mg/dL   Creatinine, Ser 1.60 (H) 0.44 - 1.00 mg/dL   Glucose, Bld 125 (H) 65 - 99 mg/dL   Calcium, Ion 1.06 (L) 1.12 - 1.23 mmol/L   TCO2 21 0 - 100 mmol/L   Hemoglobin 11.9 (L) 12.0 - 15.0 g/dL   HCT 35.0 (L) 36.0 - 46.0 %    Laboratory interpretation all normal except initial sodium is corrected to 120 or 122 depending on which method, after getting 2 L IV fluid her blood lites repeated in her sodium now corrects to either 123 or 124.    Imaging Review   Dg Shoulder Right  09/02/2014   CLINICAL DATA:  RIGHT shoulder pain for 2 months.  No known injury.  IMPRESSION: Stable exam. Small calcification with superior to the distal RIGHT clavicle could represent residua of old trauma.   Electronically Signed   By: Staci Righter M.D.   On: 09/02/2014 21:04   US Renal  08/18/2014   CLINICAL DATA:  Acute kidney injury.   .  IMPRESSION: Fullness of the right renal pelvis appears chronic. No significant hydronephrosis.  Bilateral renal cysts.   Electronically Signed   By: Markus Daft M.D.   On: 08/18/2014 07:08   US Abdomen Limited  09/03/2014   CLINICAL DATA:  Right-sided abdominal pain for 6 months.    IMPRESSION: No abnormality seen in the right upper quadrant of the abdomen.   Electronically Signed   By: Marijo Conception, M.D.   On: 09/03/2014 15:56     EKG Interpretation None     ED ECG REPORT   Date: 09/05/2014  Rate: 99  Rhythm: normal sinus rhythm  QRS Axis: left  Intervals: PR prolonged  ST/T Wave abnormalities:  nonspecific ST changes  Conduction Disutrbances:right bundle branch block  Narrative Interpretation:   Old EKG Reviewed: unchanged from prior ED a few days ago     MDM   Final diagnoses:  Chronic abdominal pain  Nausea and vomiting, vomiting of unspecified type  Hyponatremia   Plan discharge  Rolland Porter, MD, FACEP   I personally performed the services described in this documentation, which was scribed in my presence. The recorded information has been reviewed and considered.  Rolland Porter, MD, Barbette Or, MD 09/05/14 [place], MD 09/05/14 5427

## 2014-09-05 NOTE — ED Notes (Signed)
Bed: TS17 Expected date:  Expected time:  Means of arrival:  Comments: abd pain, was d/c at 0600 this am

## 2014-09-05 NOTE — ED Notes (Signed)
Pt requesting pain medication once again. Verbal order for IV toradol given.  MD Tomi Bamberger reports to inform patient the plan currently is to give IV fluids and have her follow in the PCP office this evening.

## 2014-09-05 NOTE — Discharge Instructions (Signed)
As discussed it is very important to follow-up with the gastroenterologist in 2 hours for further evaluation of your abdominal pain.  Please continue to drink plenty of fluids, do not hesitate to return if he develop new, or concerning changes in your condition.

## 2014-09-05 NOTE — Discharge Instructions (Signed)
The IV fluids improved your low sodium tonight.I spoke to Dr Gordy Levan who also talked to you earlier in the evening. She has made arrangements for you to be seen later today in their office to discuss your abdominal pain. They will call you with an appointment time.

## 2014-09-05 NOTE — ED Notes (Signed)
Explained to patient importance of following up with gastroenterologist as soon as she leaves ER.  Patient states "well if I can get there".  Patient then called daughter to come and get her who stated that she would.  Explained to patient she should leave ER and directly head to gastroenterologist.  Patient states "Well I have to get home and get a bra on I look crazy and I'm not going out like this.  They aren't going to do anything anyways".  Explained that we have ruled out any emergent conditions and that she needs to follow with gastro.  Patient states "they aren't going to do anything and this is frustrating".  Told patient "I understand her frustration but to evaluate her further she needed to see gastro.  Patient stated she had 3 cancelled appts in the past and states MD cancelled one appt as well.  Repeated importance of following with gastro immediately after leaving ER.  Patient states "I will do what I can".

## 2014-09-05 NOTE — ED Notes (Signed)
MD Knapp at bedside 

## 2014-09-05 NOTE — ED Provider Notes (Signed)
CSN: [id number]     Arrival date & time 09/05/14  1028 History   First MD Initiated Contact with Patient 09/05/14 1030     Chief Complaint  Patient presents with  . Abdominal Pain  . Emesis  . Diarrhea     HPI   Patient presents several hours after discharge from this facility with concern of ongoing diffuse abdominal discomfort. Patient states she always has some degree of abdominal pain, and this is persistent, diffuse, crampy, sore. She also c/o nausea, anorexia, weakness. She states that since discharge she has had no new vomiting, remains generally weak, with diffuse abdominal discomfort, but without other new complaints, but with concern for the persistency of her pain.    Past Medical History  Diagnosis Date  . Diabetes mellitus type II, uncontrolled   . Hypertension   . Hyperlipidemia   . Ovarian cyst, left   . Anemia     due to menorrhagia, BL 8-10  . Vaginal cyst     nabothian and bartholin  . CKD (chronic kidney disease) stage 3, GFR 30-59 ml/min     baseline creatinine 1.4-1.7  . Anxiety   . Depression   . Postmenopausal bleeding 06/12/2008  . 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 UTI (lower urinary tract infection)   . IBS (irritable bowel syndrome)   . Chronic back pain   . Chronic abdominal pain   . DDD (degenerative disc disease), lumbar   . Bilateral renal cysts 01/15/2009    Qualifier: Diagnosis of  By: Tyrell Antonio MD, [place]    . Diabetes mellitus without complication   . History of palpitations     evaluated recently 12'15  . Colitis    Past Surgical History  Procedure Laterality Date  . Cardiac surgery      to repair congenital defect as a child- 67months old   Family History  Problem Relation Age of Onset  . Stroke Father   . Heart attack Father     Had MI in his 19s  . Stomach cancer Paternal Grandmother   . Diabetes Maternal Grandmother   . Cerebral palsy Daughter   . Anesthesia problems Neg Hx   . Hypotension  Neg Hx   . Malignant hyperthermia Neg Hx   . Pseudochol deficiency Neg Hx    History  Substance Use Topics  . Smoking status: Never Smoker   . Smokeless tobacco: Never Used  . Alcohol Use: No   OB History    Gravida Para Term Preterm AB TAB SAB Ectopic Multiple Living   3 1 1  2  2   1      Review of Systems  Constitutional:       Per HPI, otherwise negative  HENT:       Per HPI, otherwise negative  Respiratory:       Per HPI, otherwise negative  Cardiovascular:       Per HPI, otherwise negative  Gastrointestinal: Positive for nausea, vomiting and abdominal pain.  Endocrine:       Negative aside from HPI  Genitourinary:       Neg aside from HPI   Musculoskeletal:       Per HPI, otherwise negative  Skin: Negative.   Neurological: Positive for weakness. Negative for syncope.      Allergies  Review of patient's allergies indicates no known allergies.  Home Medications   Prior to Admission medications   Medication Sig Start Date End Date  Taking? Authorizing Provider  acetaminophen (TYLENOL) 500 MG tablet Take 500-1,000 mg by mouth every 6 (six) hours as needed for moderate pain.   Yes Historical Provider, MD  enalapril (VASOTEC) 10 MG tablet Take 1 tablet (10 mg total) by mouth daily. 06/08/14  Yes Juliet Rude, MD  famotidine (PEPCID) 40 MG tablet Take 40 mg by mouth daily.   Yes Historical Provider, MD  glycerin adult (GLYCERIN ADULT) 2 G SUPP Place 1 suppository rectally once as needed for mild constipation or moderate constipation.   Yes Historical Provider, MD  Insulin Glargine (LANTUS SOLOSTAR) 100 UNIT/ML Solostar Pen Inject 22 Units into the skin daily at 10 pm. 06/10/14  Yes Carly J Rivet, MD  pioglitazone (ACTOS) 15 MG tablet Take 2 tablets (30 mg total) by mouth daily. Patient taking differently: Take 15 mg by mouth daily.  08/26/14  Yes [place], MD  PRESCRIPTION MEDICATION Apply 1 application topically as needed (pain). Used daughter's lidocaine cream   Yes  Historical Provider, MD  simvastatin (ZOCOR) 40 MG tablet TAKE 1 TABLET (40 MG TOTAL) BY MOUTH AT BEDTIME. Patient taking differently: Take 40 mg by mouth at bedtime.  11/26/13  Yes [name], MD  Capsaicin-Menthol-Methyl Sal (CAPSAICIN-METHYL SAL-MENTHOL) 0.025-1-12 % CREA Apply 1 application topically 2 (two) times daily. Apply to the affected area Patient not taking: Reported on 09/04/2014 09/02/14   Sindy Guadeloupe Rivet, MD  docusate sodium (COLACE) 100 MG capsule Take 1 capsule (100 mg total) by mouth every 12 (twelve) hours. Patient not taking: Reported on 09/04/2014 08/31/14   Delice Bison Ward, DO  methocarbamol (ROBAXIN) 500 MG tablet Take 1 tablet (500 mg total) by mouth 2 (two) times daily as needed for muscle spasms. Patient not taking: Reported on 09/05/2014 09/02/14   Waynetta Pean, PA-C  polyethylene glycol [facility] / Floria Raveling) packet Take 17 g by mouth daily. Patient not taking: Reported on 09/05/2014 08/31/14   Kristen N Ward, DO   BP 166/68 mmHg  Pulse 101  Temp(Src) 97.8 F (36.6 C) (Oral)  Resp 18  SpO2 97% Physical Exam  Constitutional: She is oriented to person, place, and time. She appears well-developed and well-nourished.  Anxious F appears answering questions appropriately, sitting upright in NAD.  HENT:  Head: Normocephalic and atraumatic.  Eyes: Conjunctivae and EOM are normal.  Neck: No tracheal deviation present.  Pulmonary/Chest: Effort normal. No stridor. No respiratory distress.  Abdominal: She exhibits no distension. There is no tenderness.  Musculoskeletal: She exhibits no edema.  Neurological: She is alert and oriented to person, place, and time. No cranial nerve deficit. She exhibits normal muscle tone.  Skin: Skin is warm and dry.  Psychiatric: Her mood appears anxious. Cognition and memory are not impaired.  Nursing note and vitals reviewed.   ED Course  Procedures (including critical care time) Labs Review Labs Reviewed  I-STAT CHEM 8, ED -  Abnormal; Notable for the following:    Sodium 123 (*)    Chloride 86 (*)    Creatinine, Ser 1.70 (*)    Calcium, Ion 1.07 (*)    Hemoglobin 11.6 (*)    HCT 34.0 (*)    All other components within normal limits    Imaging Review US Abdomen Limited  09/03/2014   CLINICAL DATA:  Right-sided abdominal pain for 6 months.  EXAM: US ABDOMEN LIMITED - RIGHT UPPER QUADRANT  COMPARISON:  None.  FINDINGS: Gallbladder:  No gallstones or wall thickening visualized. No sonographic Murphy sign noted.  Common  bile duct:  Diameter: 4.7 mm which is within normal limits.  Liver:  No focal lesion identified. Within normal limits in parenchymal echogenicity.  IMPRESSION: No abnormality seen in the right upper quadrant of the abdomen.   Electronically Signed   By: Marijo Conception, M.D.   On: 09/03/2014 15:56   Chart review demonstrates that the patient was here less than 6 hours ago, discharged after receiving fluid resuscitation, with increased sodium level. 20 visits in 6 months.   Repeat sodium level here is consistent with earlier today, and with the patient's tolerance of oral intake, no distress, she was encouraged to continue oral rehydration, pending further evaluation in 2 hours with gastroenterology.  On repeat exam the patient remains in similar condition, speaking clearly, answering questions appropriately, hemodynamically stable.  Patient declines offer of assistance to arrange transportation to today's gastroenterology visit.  I spent a considerable amount of time discussing the patient's abd pain w her, and reinforcing that she is pursuing etiology in an appropriate manner, and that seeing GI today was an important step.  MDM   Final diagnoses:  Generalized abdominal pain  Hyponatremia   Patient presents with concern of ongoing abdominal pain. Patient has long history of abdominal pain, and today she is in no distress, afebrile, nondistended, nontender peritoneal abdomen. She does have  mild hyponatremia though this is improved since earlier today, and she is tolerating fluid resuscitation orally. No evidence for new bacteremia or sepsis. Patient has previously scheduled follow-up with gastroenterology in 2 hours, and seems a appropriate next step for evaluation of her ongoing abdominal pain.  Patient was encouraged to proceed to that appointment, offered assistance with transportation.   Carmin Muskrat, MD 09/05/14 1214

## 2014-09-05 NOTE — ED Notes (Addendum)
MD Eliane Decree at bedside.

## 2014-09-05 NOTE — ED Notes (Addendum)
Pt alert, oriented, and ambulatory upon DC. She was advised to follow up with Dr. Gordy Levan this evening pt states "I'm not doing that, I'm in pain and I don't feel like it". Pt was encouraged once again to follow up. Pt upset about being in pain and being discharged.  Pt repeatedly interrupting this RN during discharge teaching.

## 2014-09-05 NOTE — Progress Notes (Signed)
Internal Medicine Clinic Attending  Case discussed with Dr. Rivet soon after the resident saw the patient.  We reviewed the resident's history and exam and pertinent patient test results.  I agree with the assessment, diagnosis, and plan of care documented in the resident's note.  

## 2014-09-06 ENCOUNTER — Emergency Department (HOSPITAL_COMMUNITY)
Admission: EM | Admit: 2014-09-06 | Discharge: 2014-09-06 | Disposition: A | Discharge status: Home / Self Care | Payer: No Typology Code available for payment source | Attending: Emergency Medicine | Admitting: Emergency Medicine

## 2014-09-06 ENCOUNTER — Telehealth: Payer: Self-pay | Admitting: Family Medicine

## 2014-09-06 ENCOUNTER — Encounter (HOSPITAL_COMMUNITY): Payer: Self-pay | Admitting: *Deleted

## 2014-09-06 DIAGNOSIS — N390 Urinary tract infection, site not specified: Secondary | ICD-10-CM | POA: Insufficient documentation

## 2014-09-06 DIAGNOSIS — E871 Hypo-osmolality and hyponatremia: Secondary | ICD-10-CM | POA: Insufficient documentation

## 2014-09-06 DIAGNOSIS — Z794 Long term (current) use of insulin: Secondary | ICD-10-CM | POA: Insufficient documentation

## 2014-09-06 DIAGNOSIS — I129 Hypertensive chronic kidney disease with stage 1 through stage 4 chronic kidney disease, or unspecified chronic kidney disease: Secondary | ICD-10-CM | POA: Insufficient documentation

## 2014-09-06 DIAGNOSIS — Z8742 Personal history of other diseases of the female genital tract: Secondary | ICD-10-CM | POA: Insufficient documentation

## 2014-09-06 DIAGNOSIS — N183 Chronic kidney disease, stage 3 (moderate): Secondary | ICD-10-CM | POA: Insufficient documentation

## 2014-09-06 DIAGNOSIS — F329 Major depressive disorder, single episode, unspecified: Secondary | ICD-10-CM | POA: Insufficient documentation

## 2014-09-06 DIAGNOSIS — F419 Anxiety disorder, unspecified: Secondary | ICD-10-CM | POA: Insufficient documentation

## 2014-09-06 DIAGNOSIS — E785 Hyperlipidemia, unspecified: Secondary | ICD-10-CM | POA: Insufficient documentation

## 2014-09-06 DIAGNOSIS — Z79899 Other long term (current) drug therapy: Secondary | ICD-10-CM | POA: Insufficient documentation

## 2014-09-06 DIAGNOSIS — Z862 Personal history of diseases of the blood and blood-forming organs and certain disorders involving the immune mechanism: Secondary | ICD-10-CM | POA: Insufficient documentation

## 2014-09-06 DIAGNOSIS — G8929 Other chronic pain: Secondary | ICD-10-CM | POA: Insufficient documentation

## 2014-09-06 DIAGNOSIS — M25519 Pain in unspecified shoulder: Secondary | ICD-10-CM | POA: Insufficient documentation

## 2014-09-06 DIAGNOSIS — E119 Type 2 diabetes mellitus without complications: Secondary | ICD-10-CM | POA: Insufficient documentation

## 2014-09-06 LAB — URINALYSIS, ROUTINE W REFLEX MICROSCOPIC
Bilirubin Urine: NEGATIVE
Glucose, UA: NEGATIVE mg/dL
Hgb urine dipstick: NEGATIVE
Ketones, ur: NEGATIVE mg/dL
NITRITE: NEGATIVE
PH: 5 (ref 5.0–8.0)
Protein, ur: NEGATIVE mg/dL
SPECIFIC GRAVITY, URINE: 1.009 (ref 1.005–1.030)
Urobilinogen, UA: 0.2 mg/dL (ref 0.0–1.0)

## 2014-09-06 LAB — BASIC METABOLIC PANEL
ANION GAP: 12 (ref 5–15)
BUN: 18 mg/dL (ref 6–20)
CALCIUM: 8.1 mg/dL — AB (ref 8.9–10.3)
CO2: 24 mmol/L (ref 22–32)
CREATININE: 1.83 mg/dL — AB (ref 0.44–1.00)
Chloride: 84 mmol/L — ABNORMAL LOW (ref 101–111)
GFR calc non Af Amer: 30 mL/min — ABNORMAL LOW (ref >60)
GFR, EST AFRICAN AMERICAN: 34 mL/min — AB (ref >60)
Glucose, Bld: 272 mg/dL — ABNORMAL HIGH (ref 65–99)
Potassium: 4.1 mmol/L (ref 3.5–5.1)
Sodium: 120 mmol/L — ABNORMAL LOW (ref 135–145)

## 2014-09-06 LAB — CBC
HCT: 30.2 % — ABNORMAL LOW (ref 36.0–46.0)
Hemoglobin: 10.8 g/dL — ABNORMAL LOW (ref 12.0–15.0)
MCH: 29.8 pg (ref 26.0–34.0)
MCHC: 35.8 g/dL (ref 30.0–36.0)
MCV: 83.4 fL (ref 78.0–100.0)
PLATELETS: 220 10*3/uL (ref 150–400)
RBC: 3.62 MIL/uL — ABNORMAL LOW (ref 3.87–5.11)
RDW: 12.3 % (ref 11.5–15.5)
WBC: 7.7 10*3/uL (ref 4.0–10.5)

## 2014-09-06 LAB — URINE MICROSCOPIC-ADD ON

## 2014-09-06 LAB — CBG MONITORING, ED: Glucose-Capillary: 259 mg/dL — ABNORMAL HIGH (ref 65–99)

## 2014-09-06 MED ORDER — CEPHALEXIN 250 MG PO CAPS
500.0000 mg | ORAL_CAPSULE | Freq: Once | ORAL | Status: AC
  Administered 2014-09-06: 500 mg via ORAL
  Filled 2014-09-06: qty 2

## 2014-09-06 MED ORDER — CEPHALEXIN 500 MG PO CAPS: 500.0000 mg | ORAL_CAPSULE | Freq: Four times a day (QID) | ORAL | Status: DC

## 2014-09-06 NOTE — ED Provider Notes (Signed)
CSN: [id number]     Arrival date & time 09/06/14  1828 History   First MD Initiated Contact with Patient 09/06/14 1846     Chief Complaint  Patient presents with  . Weakness  . Shoulder Pain     (Consider location/radiation/quality/duration/timing/severity/associated sxs/prior Treatment) HPI Comments: The patient is a 59 year old female, she has a history of type 2 diabetes, hypertension, anemia, chronic kidney disease, anxiety and depression. The patient has been seen multiple times in the emergency department over the last month for a variety of complaints mostly gastrointestinal with abdominal pain, nausea vomiting and diarrhea. She states that she followed up yesterday with her gastroenterologist and a new medication was prescribed so she does not bring the medication with her. Of note her labs have shown a persistent hyponatremia over the last several weeks as low as 118 but mostly in 120-125 range. She denies a fever chills and she has had no nausea or vomiting today. She has eaten a normal meal today. She has no swelling of her legs. She states that she does not feel steady when she walks but she has been able to drive without difficulty.  Patient is a 59 y.o. female presenting with weakness and shoulder pain. The history is provided by the patient.  Weakness  Shoulder Pain   Past Medical History  Diagnosis Date  . Diabetes mellitus type II, uncontrolled   . Hypertension   . Hyperlipidemia   . Ovarian cyst, left   . Anemia     due to menorrhagia, BL 8-10  . Vaginal cyst     nabothian and bartholin  . CKD (chronic kidney disease) stage 3, GFR 30-59 ml/min     baseline creatinine 1.4-1.7  . Anxiety   . Depression   . Postmenopausal bleeding 06/12/2008  . 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 UTI (lower urinary tract infection)   . IBS (irritable bowel syndrome)   . Chronic back pain   . Chronic abdominal pain   . DDD (degenerative disc disease),  lumbar   . Bilateral renal cysts 01/15/2009    Qualifier: Diagnosis of  By: Tyrell Antonio MD, Belkys    . Diabetes mellitus without complication   . History of palpitations     evaluated recently 12'15  . Colitis    Past Surgical History  Procedure Laterality Date  . Cardiac surgery      to repair congenital defect as a child- 56months old   Family History  Problem Relation Age of Onset  . Stroke Father   . Heart attack Father     Had MI in his 67s  . Stomach cancer Paternal Grandmother   . Diabetes Maternal Grandmother   . Cerebral palsy Daughter   . Anesthesia problems Neg Hx   . Hypotension Neg Hx   . Malignant hyperthermia Neg Hx   . Pseudochol deficiency Neg Hx    History  Substance Use Topics  . Smoking status: Never Smoker   . Smokeless tobacco: Never Used  . Alcohol Use: No   OB History    Gravida Para Term Preterm AB TAB SAB Ectopic Multiple Living   3 1 1  2  2   1      Review of Systems  Neurological: Positive for weakness.  All other systems reviewed and are negative.     Allergies  Review of patient's allergies indicates no known allergies.  Home Medications   Prior to Admission medications  Medication Sig Start Date End Date Taking? Authorizing Provider  acetaminophen (TYLENOL) 500 MG tablet Take 1,000 mg by mouth every 6 (six) hours as needed for moderate pain.    Yes Historical Provider, MD  enalapril (VASOTEC) 10 MG tablet Take 1 tablet (10 mg total) by mouth daily. 06/08/14  Yes Juliet Rude, MD  famotidine (PEPCID) 40 MG tablet Take 40 mg by mouth daily.   Yes Historical Provider, MD  Insulin Glargine (LANTUS SOLOSTAR) 100 UNIT/ML Solostar Pen Inject 22 Units into the skin daily at 10 pm. 06/10/14  Yes Carly J Rivet, MD  pioglitazone (ACTOS) 30 MG tablet Take 30 mg by mouth daily.   Yes Historical Provider, MD  simvastatin (ZOCOR) 40 MG tablet TAKE 1 TABLET (40 MG TOTAL) BY MOUTH AT BEDTIME. Patient taking differently: Take 40 mg by mouth at bedtime.   11/26/13  Yes [name], MD  Capsaicin-Menthol-Methyl Sal (CAPSAICIN-METHYL SAL-MENTHOL) 0.025-1-12 % CREA Apply 1 application topically 2 (two) times daily. Apply to the affected area Patient not taking: Reported on 09/04/2014 09/02/14   Sindy Guadeloupe Rivet, MD  cephALEXin (KEFLEX) 500 MG capsule Take 1 capsule (500 mg total) by mouth 4 (four) times daily. 09/06/14   Noemi Chapel, MD  docusate sodium (COLACE) 100 MG capsule Take 1 capsule (100 mg total) by mouth every 12 (twelve) hours. Patient not taking: Reported on 09/04/2014 08/31/14   Delice Bison Ward, DO  methocarbamol (ROBAXIN) 500 MG tablet Take 1 tablet (500 mg total) by mouth 2 (two) times daily as needed for muscle spasms. Patient not taking: Reported on 09/05/2014 09/02/14   Waynetta Pean, PA-C  pioglitazone (ACTOS) 15 MG tablet Take 2 tablets (30 mg total) by mouth daily. Patient taking differently: Take 15 mg by mouth daily.  08/26/14   Carly Montey Hora, MD  polyethylene glycol (MIRALAX / GLYCOLAX) packet Take 17 g by mouth daily. Patient not taking: Reported on 09/05/2014 08/31/14   Kristen N Ward, DO   BP 116/60 mmHg  Pulse 90  Temp(Src) 97.9 F (36.6 C) (Oral)  Resp 19  Ht 5\' 6"  (1.676 m)  Wt 169 lb 1.5 oz (76.7 kg)  BMI 27.31 kg/m2  SpO2 100% Physical Exam  Constitutional: She appears well-developed and well-nourished. No distress.  HENT:  Head: Normocephalic and atraumatic.  Mouth/Throat: Oropharynx is clear and moist. No oropharyngeal exudate.  Eyes: Conjunctivae and EOM are normal. Pupils are equal, round, and reactive to light. Right eye exhibits no discharge. Left eye exhibits no discharge. No scleral icterus.  Neck: Normal range of motion. Neck supple. No JVD present. No thyromegaly present.  Cardiovascular: Normal rate, regular rhythm, normal heart sounds and intact distal pulses.  Exam reveals no gallop and no friction rub.   No murmur heard. Pulmonary/Chest: Effort normal and breath sounds normal. No respiratory  distress. She has no wheezes. She has no rales.  Abdominal: Soft. Bowel sounds are normal. She exhibits no distension and no mass. There is no tenderness.  Musculoskeletal: Normal range of motion. She exhibits no edema or tenderness.  Lymphadenopathy:    She has no cervical adenopathy.  Neurological: She is alert. Coordination normal.  The patient has normal speech, normal coordination, normal finger-nose-finger, normal gait which is totally steady without any ataxia, normal strength in all 4 extremities, normal sensation to light touch, normal cranial nerves III through XII.  Skin: Skin is warm and dry. No rash noted. No erythema.  Psychiatric: She has a normal mood and affect. Her behavior is  normal.  The patient appears mildly agitated, she continues to state "I should never have come", she feels paranoid that no one wants to help her, she denies any depression or feelings of self injury.  Nursing note and vitals reviewed.   ED Course  Procedures (including critical care time) Labs Review Labs Reviewed  BASIC METABOLIC PANEL - Abnormal; Notable for the following:    Sodium 120 (*)    Chloride 84 (*)    Glucose, Bld 272 (*)    Creatinine, Ser 1.83 (*)    Calcium 8.1 (*)    GFR calc non Af Amer 30 (*)    GFR calc Af Amer 34 (*)    All other components within normal limits  CBC - Abnormal; Notable for the following:    RBC 3.62 (*)    Hemoglobin 10.8 (*)    HCT 30.2 (*)    All other components within normal limits  URINALYSIS, ROUTINE W REFLEX MICROSCOPIC (NOT AT [facility]) - Abnormal; Notable for the following:    APPearance CLOUDY (*)    Leukocytes, UA LARGE (*)    All other components within normal limits  URINE MICROSCOPIC-ADD ON - Abnormal; Notable for the following:    Squamous Epithelial / LPF FEW (*)    Bacteria, UA FEW (*)    All other components within normal limits  CBG MONITORING, ED - Abnormal; Notable for the following:    Glucose-Capillary 259 (*)    All other  components within normal limits  URINE CULTURE    Imaging Review No results found.    MDM   Final diagnoses:  Hyponatremia  UTI (lower urinary tract infection)    Normal gait, normal VS, revievwed imaging of shoulder from 4 days ago - no acute findings - has normal ROM and strength of the RUE.  She has hyponatremia over time - recheck today.  No diarrhea or vomiting today.  The patient has had the ability to ambulate without any difficulty. I walked with her up and on the hallway, she appears very stable on her feet. She has been salting her food, she will continue to do that and restrict her water intake, will start antibiotics for her urinary infection, urine culture ordered, this was described to the patient including the indications for return and the treatment plan and she is in total agreement.  Meds given in ED:  Medications  cephALEXin (KEFLEX) capsule 500 mg (not administered)    New Prescriptions   CEPHALEXIN (KEFLEX) 500 MG CAPSULE    Take 1 capsule (500 mg total) by mouth 4 (four) times daily.      Noemi Chapel, MD 09/06/14 2043

## 2014-09-06 NOTE — ED Notes (Signed)
Pt A&Ox4 ambulatory at d/c with steady gait, NAD. Pt states she has all of her belongings with her at discharge.

## 2014-09-06 NOTE — Telephone Encounter (Signed)
Emergency Line  Patient called while driving through drive through. Very distracted. States she is very fatigued. Has been feeling like this for years. No chest pain, SOB, bleeding, pain. Just not fatigued. Becomes tearful. States she feels she's tired of talking to MDs and wants to feel better. Does not want to go to the ED or see her PCP. Just wants to feel better. No SI or HI currently. Advised she could go to the ED if she feels overwhelmed.   Archie Patten, MD [facility] Resident  09/06/2014, 7:47 PM

## 2014-09-06 NOTE — Discharge Instructions (Signed)

## 2014-09-06 NOTE — ED Notes (Signed)
Patient is here due to ongoing weakness.  She states she does not feel like doing anything. She also has shoulder pain that has been evaluated.  She denies any chest pain.   Denies any recent trauma

## 2014-09-08 LAB — URINE CULTURE

## 2014-09-09 ENCOUNTER — Telehealth: Payer: Self-pay | Admitting: Internal Medicine

## 2014-09-09 ENCOUNTER — Encounter (HOSPITAL_COMMUNITY): Payer: Self-pay | Admitting: *Deleted

## 2014-09-09 ENCOUNTER — Emergency Department (HOSPITAL_COMMUNITY)
Admission: EM | Admit: 2014-09-09 | Discharge: 2014-09-09 | Disposition: A | Discharge status: Home / Self Care | Payer: No Typology Code available for payment source | Attending: Emergency Medicine | Admitting: Emergency Medicine

## 2014-09-09 ENCOUNTER — Encounter (HOSPITAL_COMMUNITY): Payer: Self-pay | Admitting: Emergency Medicine

## 2014-09-09 DIAGNOSIS — R350 Frequency of micturition: Secondary | ICD-10-CM | POA: Insufficient documentation

## 2014-09-09 DIAGNOSIS — Z792 Long term (current) use of antibiotics: Secondary | ICD-10-CM | POA: Insufficient documentation

## 2014-09-09 DIAGNOSIS — F419 Anxiety disorder, unspecified: Secondary | ICD-10-CM | POA: Insufficient documentation

## 2014-09-09 DIAGNOSIS — Q6102 Congenital multiple renal cysts: Secondary | ICD-10-CM | POA: Insufficient documentation

## 2014-09-09 DIAGNOSIS — Z79899 Other long term (current) drug therapy: Secondary | ICD-10-CM | POA: Insufficient documentation

## 2014-09-09 DIAGNOSIS — R110 Nausea: Secondary | ICD-10-CM | POA: Insufficient documentation

## 2014-09-09 DIAGNOSIS — R1110 Vomiting, unspecified: Secondary | ICD-10-CM | POA: Insufficient documentation

## 2014-09-09 DIAGNOSIS — I129 Hypertensive chronic kidney disease with stage 1 through stage 4 chronic kidney disease, or unspecified chronic kidney disease: Secondary | ICD-10-CM | POA: Insufficient documentation

## 2014-09-09 DIAGNOSIS — E871 Hypo-osmolality and hyponatremia: Secondary | ICD-10-CM | POA: Insufficient documentation

## 2014-09-09 DIAGNOSIS — E119 Type 2 diabetes mellitus without complications: Secondary | ICD-10-CM | POA: Insufficient documentation

## 2014-09-09 DIAGNOSIS — Z862 Personal history of diseases of the blood and blood-forming organs and certain disorders involving the immune mechanism: Secondary | ICD-10-CM | POA: Insufficient documentation

## 2014-09-09 DIAGNOSIS — R109 Unspecified abdominal pain: Secondary | ICD-10-CM | POA: Insufficient documentation

## 2014-09-09 DIAGNOSIS — R140 Abdominal distension (gaseous): Secondary | ICD-10-CM | POA: Insufficient documentation

## 2014-09-09 DIAGNOSIS — N183 Chronic kidney disease, stage 3 (moderate): Secondary | ICD-10-CM | POA: Insufficient documentation

## 2014-09-09 DIAGNOSIS — E785 Hyperlipidemia, unspecified: Secondary | ICD-10-CM | POA: Insufficient documentation

## 2014-09-09 DIAGNOSIS — Z87440 Personal history of urinary (tract) infections: Secondary | ICD-10-CM | POA: Insufficient documentation

## 2014-09-09 DIAGNOSIS — G8929 Other chronic pain: Secondary | ICD-10-CM | POA: Insufficient documentation

## 2014-09-09 DIAGNOSIS — K5900 Constipation, unspecified: Secondary | ICD-10-CM | POA: Insufficient documentation

## 2014-09-09 DIAGNOSIS — R197 Diarrhea, unspecified: Secondary | ICD-10-CM | POA: Insufficient documentation

## 2014-09-09 DIAGNOSIS — Z8774 Personal history of (corrected) congenital malformations of heart and circulatory system: Secondary | ICD-10-CM | POA: Insufficient documentation

## 2014-09-09 DIAGNOSIS — Z8739 Personal history of other diseases of the musculoskeletal system and connective tissue: Secondary | ICD-10-CM | POA: Insufficient documentation

## 2014-09-09 DIAGNOSIS — Z8742 Personal history of other diseases of the female genital tract: Secondary | ICD-10-CM | POA: Insufficient documentation

## 2014-09-09 DIAGNOSIS — Z794 Long term (current) use of insulin: Secondary | ICD-10-CM | POA: Insufficient documentation

## 2014-09-09 DIAGNOSIS — Z9889 Other specified postprocedural states: Secondary | ICD-10-CM | POA: Insufficient documentation

## 2014-09-09 LAB — URINALYSIS, ROUTINE W REFLEX MICROSCOPIC
Bilirubin Urine: NEGATIVE
Glucose, UA: 100 mg/dL — AB
Hgb urine dipstick: NEGATIVE
KETONES UR: NEGATIVE mg/dL
Nitrite: NEGATIVE
PH: 6.5 (ref 5.0–8.0)
Protein, ur: NEGATIVE mg/dL
Specific Gravity, Urine: 1.006 (ref 1.005–1.030)
UROBILINOGEN UA: 0.2 mg/dL (ref 0.0–1.0)

## 2014-09-09 LAB — URINE MICROSCOPIC-ADD ON

## 2014-09-09 LAB — CBC
HCT: 30.7 % — ABNORMAL LOW (ref 36.0–46.0)
HEMOGLOBIN: 11.1 g/dL — AB (ref 12.0–15.0)
MCH: 30.7 pg (ref 26.0–34.0)
MCHC: 36.2 g/dL — ABNORMAL HIGH (ref 30.0–36.0)
MCV: 85 fL (ref 78.0–100.0)
Platelets: 221 10*3/uL (ref 150–400)
RBC: 3.61 MIL/uL — AB (ref 3.87–5.11)
RDW: 12.4 % (ref 11.5–15.5)
WBC: 6.6 10*3/uL (ref 4.0–10.5)

## 2014-09-09 LAB — COMPREHENSIVE METABOLIC PANEL
ALBUMIN: 3.7 g/dL (ref 3.5–5.0)
ALT: 18 U/L (ref 14–54)
ANION GAP: 8 (ref 5–15)
AST: 23 U/L (ref 15–41)
Alkaline Phosphatase: 85 U/L (ref 38–126)
BUN: 13 mg/dL (ref 6–20)
CO2: 26 mmol/L (ref 22–32)
Calcium: 8.3 mg/dL — ABNORMAL LOW (ref 8.9–10.3)
Chloride: 88 mmol/L — ABNORMAL LOW (ref 101–111)
Creatinine, Ser: 1.64 mg/dL — ABNORMAL HIGH (ref 0.44–1.00)
GFR calc Af Amer: 39 mL/min — ABNORMAL LOW (ref >60)
GFR calc non Af Amer: 34 mL/min — ABNORMAL LOW (ref >60)
Glucose, Bld: 237 mg/dL — ABNORMAL HIGH (ref 65–99)
Potassium: 4 mmol/L (ref 3.5–5.1)
SODIUM: 122 mmol/L — AB (ref 135–145)
Total Bilirubin: 0.4 mg/dL (ref 0.3–1.2)
Total Protein: 6.4 g/dL — ABNORMAL LOW (ref 6.5–8.1)

## 2014-09-09 LAB — LIPASE, BLOOD: LIPASE: 22 U/L (ref 22–51)

## 2014-09-09 MED ORDER — DICYCLOMINE HCL 20 MG PO TABS: 20.0000 mg | ORAL_TABLET | Freq: Four times a day (QID) | ORAL | Status: DC | PRN

## 2014-09-09 MED ORDER — ONDANSETRON 4 MG PO TBDP
8.0000 mg | ORAL_TABLET | Freq: Once | ORAL | Status: AC
  Administered 2014-09-09: 8 mg via ORAL
  Filled 2014-09-09: qty 2

## 2014-09-09 MED ORDER — POLYETHYLENE GLYCOL 3350 17 G PO PACK: 17.0000 g | PACK | Freq: Every day | ORAL | Status: DC

## 2014-09-09 MED ORDER — GI COCKTAIL ~~LOC~~
30.0000 mL | Freq: Once | ORAL | Status: AC
  Administered 2014-09-09: 30 mL via ORAL
  Filled 2014-09-09: qty 30

## 2014-09-09 MED ORDER — ONDANSETRON 8 MG PO TBDP: 8.0000 mg | ORAL_TABLET | Freq: Three times a day (TID) | ORAL | Status: DC | PRN

## 2014-09-09 NOTE — Discharge Instructions (Signed)
Abdominal Pain Many things can cause abdominal pain. Usually, abdominal pain is not caused by a disease and will improve without treatment. It can often be observed and treated at home. Your health care provider will do a physical exam and possibly order blood tests and X-rays to help determine the seriousness of your pain. However, in many cases, more time must pass before a clear cause of the pain can be found. Before that point, your health care provider may not know if you need more testing or further treatment. HOME CARE INSTRUCTIONS  Monitor your abdominal pain for any changes. The following actions may help to alleviate any discomfort you are experiencing:  Only take over-the-counter or prescription medicines as directed by your health care provider.  Do not take laxatives unless directed to do so by your health care provider.  Try a clear liquid diet (broth, tea, or water) as directed by your health care provider. Slowly move to a bland diet as tolerated. SEEK MEDICAL CARE IF:  You have unexplained abdominal pain.  You have abdominal pain associated with nausea or diarrhea.  You have pain when you urinate or have a bowel movement.  You experience abdominal pain that wakes you in the night.  You have abdominal pain that is worsened or improved by eating food.  You have abdominal pain that is worsened with eating fatty foods.  You have a fever. SEEK IMMEDIATE MEDICAL CARE IF:   Your pain does not go away within 2 hours.  You keep throwing up (vomiting).  Your pain is felt only in portions of the abdomen, such as the right side or the left lower portion of the abdomen.  You pass bloody or black tarry stools. MAKE SURE YOU:  Understand these instructions.   Will watch your condition.   Will get help right away if you are not doing well or get worse.  Document Released: 11/03/2004 Document Revised: 01/29/2013 Document Reviewed: 10/03/2012 [facility] Patient Information  2015 [place], [place]. This information is not intended to replace advice given to you by your health care provider. Make sure you discuss any questions you have with your health care provider.  Hyponatremia  Hyponatremia is when the salt (sodium) in your blood is low. When salt becomes low, your cells take in extra water and puff up (swell). The puffiness can happen in the whole body. It mostly affects the brain and is very serious.  HOME CARE  Only take medicine as told by your doctor.  Follow any diet instructions you were given. This includes limiting how much fluid you drink.  Keep all doctor visits for tests as told.  Avoid alcohol and drugs. GET HELP RIGHT AWAY IF:  You start to twitch and shake (seize).  You pass out (faint).  You continue to have watery poop (diarrhea) or you throw up (vomit).  You feel sick to your stomach (nauseous).  You are tired (fatigued), have a headache, are confused, or feel weak.  Your problems that first brought you to the doctor come back.  You have trouble following your diet instructions. MAKE SURE YOU:   Understand these instructions.  Will watch your condition.  Will get help right away if you are not doing well or get worse. Document Released: 10/06/2010 Document Revised: 04/18/2011 Document Reviewed: 10/06/2010 [facility] Patient Information 2015 [place], [place]. This information is not intended to replace advice given to you by your health care provider. Make sure you discuss any questions you have with your health care  provider.

## 2014-09-09 NOTE — ED Provider Notes (Signed)
CSN: [id number]     Arrival date & time 09/09/14  0152 History   This chart was scribed for Linton Flemings, MD by Hansel Feinstein, ED Scribe. This patient was seen in room D34C/D34C and the patient's care was started at 2:59 AM.      Chief Complaint  Patient presents with  . Abdominal Pain   The history is provided by the patient. No language interpreter was used.    HPI Comments: [name] is a 59 y.o. female with Hx of DM, HTN, HLD, anemia, ovarian cyst, CKD, IBS, chronic abdominal pain, DDD, colitis who presents to the Emergency Department complaining of moderate abdominal pain described as "warm" onset last night. Pt reports associated frequency, urgency, bloating, one episode of vomiting and diarrhea. She has taken Tylenol with mild relief. She notes that she also took a suppository yesterday with mild relief. She has not taken miralax. She notes that she had a colonoscopy with Dr. Paulita Fujita 5 days ago and was prescribed Carafate. Pt is also on Keflex for a recent UTI. Pt is well known to the ED- with visits on 7/11, 7/15, 7/27, 7/28 and 7/29 for abdominal pain, visits on 7/26 and 7/30 for shoulder pain, visits on 7/20 and 7/24 for constipation and 12 other visits in the past 6 months. Pt has an EGD scheduled with Dr. Paulita Fujita on 09/11/14. She denies other symptoms.   Past Medical History  Diagnosis Date  . Diabetes mellitus type II, uncontrolled   . Hypertension   . Hyperlipidemia   . Ovarian cyst, left   . Anemia     due to menorrhagia, BL 8-10  . Vaginal cyst     nabothian and bartholin  . CKD (chronic kidney disease) stage 3, GFR 30-59 ml/min     baseline creatinine 1.4-1.7  . Anxiety   . Depression   . Postmenopausal bleeding 06/12/2008  . 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 UTI (lower urinary tract infection)   . IBS (irritable bowel syndrome)   . Chronic back pain   . Chronic abdominal pain   . DDD (degenerative disc disease), lumbar   . Bilateral renal cysts  01/15/2009    Qualifier: Diagnosis of  By: Tyrell Antonio MD, Belkys    . Diabetes mellitus without complication   . History of palpitations     evaluated recently 12'15  . Colitis    Past Surgical History  Procedure Laterality Date  . Cardiac surgery      to repair congenital defect as a child- 16months old   Family History  Problem Relation Age of Onset  . Stroke Father   . Heart attack Father     Had MI in his 75s  . Stomach cancer Paternal Grandmother   . Diabetes Maternal Grandmother   . Cerebral palsy Daughter   . Anesthesia problems Neg Hx   . Hypotension Neg Hx   . Malignant hyperthermia Neg Hx   . Pseudochol deficiency Neg Hx    History  Substance Use Topics  . Smoking status: Never Smoker   . Smokeless tobacco: Never Used  . Alcohol Use: No   OB History    Gravida Para Term Preterm AB TAB SAB Ectopic Multiple Living   3 1 1  2  2   1      Review of Systems  Gastrointestinal: Positive for vomiting, abdominal pain and diarrhea.  Genitourinary: Positive for urgency and frequency.  All other systems reviewed and are  negative.   Allergies  Review of patient's allergies indicates no known allergies.  Home Medications   Prior to Admission medications   Medication Sig Start Date End Date Taking? Authorizing Provider  acetaminophen (TYLENOL) 500 MG tablet Take 1,000 mg by mouth every 6 (six) hours as needed for moderate pain.     Historical Provider, MD  Capsaicin-Menthol-Methyl Sal (CAPSAICIN-METHYL SAL-MENTHOL) 0.025-1-12 % CREA Apply 1 application topically 2 (two) times daily. Apply to the affected area Patient not taking: Reported on 09/04/2014 09/02/14   Sindy Guadeloupe Rivet, MD  cephALEXin (KEFLEX) 500 MG capsule Take 1 capsule (500 mg total) by mouth 4 (four) times daily. 09/06/14   Noemi Chapel, MD  docusate sodium (COLACE) 100 MG capsule Take 1 capsule (100 mg total) by mouth every 12 (twelve) hours. Patient not taking: Reported on 09/04/2014 08/31/14   Kristen N Ward, DO   enalapril (VASOTEC) 10 MG tablet Take 1 tablet (10 mg total) by mouth daily. 06/08/14   Juliet Rude, MD  famotidine (PEPCID) 40 MG tablet Take 40 mg by mouth daily.    Historical Provider, MD  Insulin Glargine (LANTUS SOLOSTAR) 100 UNIT/ML Solostar Pen Inject 22 Units into the skin daily at 10 pm. 06/10/14   Juliet Rude, MD  methocarbamol (ROBAXIN) 500 MG tablet Take 1 tablet (500 mg total) by mouth 2 (two) times daily as needed for muscle spasms. Patient not taking: Reported on 09/05/2014 09/02/14   Waynetta Pean, PA-C  pioglitazone (ACTOS) 15 MG tablet Take 2 tablets (30 mg total) by mouth daily. Patient taking differently: Take 15 mg by mouth daily.  08/26/14   Carly Montey Hora, MD  pioglitazone (ACTOS) 30 MG tablet Take 30 mg by mouth daily.    Historical Provider, MD  polyethylene glycol (MIRALAX / GLYCOLAX) packet Take 17 g by mouth daily. Patient not taking: Reported on 09/05/2014 08/31/14   Kristen N Ward, DO  simvastatin (ZOCOR) 40 MG tablet TAKE 1 TABLET (40 MG TOTAL) BY MOUTH AT BEDTIME. Patient taking differently: Take 40 mg by mouth at bedtime.  11/26/13   [name], MD   BP 166/71 mmHg  Pulse 95  Temp(Src) 97.5 F (36.4 C) (Oral)  Resp 20  SpO2 99% Physical Exam  Constitutional: She is oriented to person, place, and time. She appears well-developed and well-nourished.  HENT:  Head: Normocephalic and atraumatic.  Nose: Nose normal.  Mouth/Throat: Oropharynx is clear and moist.  Eyes: Conjunctivae and EOM are normal. Pupils are equal, round, and reactive to light.  Neck: Normal range of motion. Neck supple. No JVD present. No tracheal deviation present. No thyromegaly present.  Cardiovascular: Normal rate, regular rhythm, normal heart sounds and intact distal pulses.  Exam reveals no gallop and no friction rub.   No murmur heard. Pulmonary/Chest: Effort normal and breath sounds normal. No stridor. No respiratory distress. She has no wheezes. She has no rales. She  exhibits no tenderness.  Abdominal: Soft. Bowel sounds are normal. She exhibits no distension and no mass. There is tenderness (mild diffuse tenderness). There is no rebound and no guarding.  Musculoskeletal: Normal range of motion. She exhibits no edema or tenderness.  Lymphadenopathy:    She has no cervical adenopathy.  Neurological: She is alert and oriented to person, place, and time. She displays normal reflexes. She exhibits normal muscle tone. Coordination normal.  Skin: Skin is warm and dry. No rash noted. No erythema. No pallor.  Psychiatric:  Patient is anxious with flat affect, pressured speech  Nursing note and vitals reviewed.   ED Course  Procedures (including critical care time) DIAGNOSTIC STUDIES: Oxygen Saturation is 99% on RA, normal by my interpretation.    COORDINATION OF CARE: 3:08 AM Discussed treatment plan with pt at bedside and pt agreed to plan.   Labs Review Labs Reviewed  COMPREHENSIVE METABOLIC PANEL - Abnormal; Notable for the following:    Sodium 122 (*)    Chloride 88 (*)    Glucose, Bld 237 (*)    Creatinine, Ser 1.64 (*)    Calcium 8.3 (*)    Total Protein 6.4 (*)    GFR calc non Af Amer 34 (*)    GFR calc Af Amer 39 (*)    All other components within normal limits  CBC - Abnormal; Notable for the following:    RBC 3.61 (*)    Hemoglobin 11.1 (*)    HCT 30.7 (*)    MCHC 36.2 (*)    All other components within normal limits  URINALYSIS, ROUTINE W REFLEX MICROSCOPIC (NOT AT [facility]) - Abnormal; Notable for the following:    Color, Urine STRAW (*)    APPearance CLOUDY (*)    Glucose, UA 100 (*)    Leukocytes, UA MODERATE (*)    All other components within normal limits  URINE MICROSCOPIC-ADD ON - Abnormal; Notable for the following:    Squamous Epithelial / LPF FEW (*)    Bacteria, UA FEW (*)    All other components within normal limits  LIPASE, BLOOD    Imaging Review No results found.   EKG Interpretation None      MDM   Final  diagnoses:  Hyponatremia  Chronic abdominal pain    I personally performed the services described in this documentation, which was scribed in my presence. The recorded information has been reviewed and is accurate.  59 year old female with frequent visits to the emergency department with complaint of burning warmth to her abdomen.  At this time with nausea.  She is requesting GI cocktail.  She has follow-up with gastroenterology on Thursday with upper endoscopy.  Her labs show hyponatremia, which is been ongoing.  She has follow-up with primary care doctor, as well as gastroneurology.  She is stable for discharge home  Linton Flemings, MD 09/09/14 [phone]

## 2014-09-09 NOTE — Telephone Encounter (Signed)
   Reason for call:   I received a call from Ms. Maximino Greenland at 7  PM indicating that she is very upset with her gastroenterologist whom she just got off the phone with.  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  It is difficult to decern her main complaints as she seems very distracted and also occasionally stops speaking to me to respond to her daughter.  It appears she is complaining about dyspepsia and that no one has figured out why.    Pertinent Data:   Has been seen on an almost daily to QOD in the ED or our clinic for various complaints including hyponatremia,    Assessment / Plan / Recommendations:   She does have some very real medical problems that seem to be getting addressed by multiple physicians.  She does seem to perseverate on the idea that she is the sick one and not her daughter which may be a sign of Munchausen syndrome but this would be rule out diagnosis.  I did discuss with her that carafate is safe for her to take.    She reported that she planned to go [facility] ER for evaluation since [facility] does not care and hung up.   Lucious Groves, DO   09/09/2014, 7:03 PM

## 2014-09-09 NOTE — Telephone Encounter (Signed)
Pt requesting for the nurse to call back.

## 2014-09-09 NOTE — ED Notes (Signed)
Pt. reports mid abdominal pain for several weeks worse today with nausea , denies emesis or diarrhea , no fever or chills.

## 2014-09-09 NOTE — Telephone Encounter (Signed)
Called pt back, went straight to vmail, lm for rtc

## 2014-09-10 NOTE — Telephone Encounter (Signed)
i have called pt 3 times no rtc, will call 1 more time

## 2014-09-11 ENCOUNTER — Emergency Department (HOSPITAL_COMMUNITY)
Admission: EM | Admit: 2014-09-11 | Discharge: 2014-09-11 | Discharge status: Against Medical Advice | Payer: No Typology Code available for payment source | Attending: Emergency Medicine | Admitting: Emergency Medicine

## 2014-09-11 ENCOUNTER — Telehealth: Payer: Self-pay | Admitting: Internal Medicine

## 2014-09-11 ENCOUNTER — Ambulatory Visit (HOSPITAL_COMMUNITY)
Admission: RE | Admit: 2014-09-11 | Payer: No Typology Code available for payment source | Source: Ambulatory Visit | Admitting: Gastroenterology

## 2014-09-11 ENCOUNTER — Encounter (HOSPITAL_COMMUNITY): Payer: Self-pay | Admitting: *Deleted

## 2014-09-11 ENCOUNTER — Encounter: Payer: Self-pay | Admitting: *Deleted

## 2014-09-11 DIAGNOSIS — I129 Hypertensive chronic kidney disease with stage 1 through stage 4 chronic kidney disease, or unspecified chronic kidney disease: Secondary | ICD-10-CM | POA: Insufficient documentation

## 2014-09-11 DIAGNOSIS — R251 Tremor, unspecified: Secondary | ICD-10-CM | POA: Insufficient documentation

## 2014-09-11 DIAGNOSIS — R109 Unspecified abdominal pain: Secondary | ICD-10-CM | POA: Insufficient documentation

## 2014-09-11 DIAGNOSIS — G8929 Other chronic pain: Secondary | ICD-10-CM | POA: Insufficient documentation

## 2014-09-11 DIAGNOSIS — N183 Chronic kidney disease, stage 3 (moderate): Secondary | ICD-10-CM | POA: Insufficient documentation

## 2014-09-11 DIAGNOSIS — M25511 Pain in right shoulder: Secondary | ICD-10-CM | POA: Insufficient documentation

## 2014-09-11 DIAGNOSIS — E119 Type 2 diabetes mellitus without complications: Secondary | ICD-10-CM | POA: Insufficient documentation

## 2014-09-11 LAB — CBC
HEMATOCRIT: 32.5 % — AB (ref 36.0–46.0)
Hemoglobin: 11.4 g/dL — ABNORMAL LOW (ref 12.0–15.0)
MCH: 30.5 pg (ref 26.0–34.0)
MCHC: 35.1 g/dL (ref 30.0–36.0)
MCV: 86.9 fL (ref 78.0–100.0)
PLATELETS: 244 10*3/uL (ref 150–400)
RBC: 3.74 MIL/uL — ABNORMAL LOW (ref 3.87–5.11)
RDW: 12.8 % (ref 11.5–15.5)
WBC: 7.4 10*3/uL (ref 4.0–10.5)

## 2014-09-11 LAB — COMPREHENSIVE METABOLIC PANEL
ALT: 23 U/L (ref 14–54)
ANION GAP: 11 (ref 5–15)
AST: 25 U/L (ref 15–41)
Albumin: 3.8 g/dL (ref 3.5–5.0)
Alkaline Phosphatase: 82 U/L (ref 38–126)
BUN: 21 mg/dL — AB (ref 6–20)
CO2: 26 mmol/L (ref 22–32)
Calcium: 9 mg/dL (ref 8.9–10.3)
Chloride: 90 mmol/L — ABNORMAL LOW (ref 101–111)
Creatinine, Ser: 1.46 mg/dL — ABNORMAL HIGH (ref 0.44–1.00)
GFR calc Af Amer: 45 mL/min — ABNORMAL LOW (ref >60)
GFR, EST NON AFRICAN AMERICAN: 39 mL/min — AB (ref >60)
Glucose, Bld: 323 mg/dL — ABNORMAL HIGH (ref 65–99)
Potassium: 4.7 mmol/L (ref 3.5–5.1)
Sodium: 127 mmol/L — ABNORMAL LOW (ref 135–145)
Total Bilirubin: 0.5 mg/dL (ref 0.3–1.2)
Total Protein: 6.3 g/dL — ABNORMAL LOW (ref 6.5–8.1)

## 2014-09-11 LAB — LIPASE, BLOOD: LIPASE: 21 U/L — AB (ref 22–51)

## 2014-09-11 SURGERY — EGD (ESOPHAGOGASTRODUODENOSCOPY)
Anesthesia: Monitor Anesthesia Care

## 2014-09-11 NOTE — ED Notes (Signed)
Pt states that she is going to see her dr tomorrow and get her blood work results from him. States she does not wish to wait any longer.

## 2014-09-11 NOTE — Telephone Encounter (Signed)
Patient called overnight stating that she was just in the ED and didn't want to wait all night for them to check her sodium and asked if I would personally do this for her. I explained to her that I do not have this capability and she will need to wait for an ED provider to check her labs. OF NOTE: patient has an appointment in the [facility] clinic TOMORROW at 3 PM w/ Dr. Lovena Le at which time her sodium can be checked. She denies any significant complaints currently. Her ED notes, she was previously having right shoulder pain, abdominal pain, and shakiness. Patient left ED as she did not want to wait any longer.   Natasha Bence, MD PGY-3, Internal Medicine Pager: [phone]

## 2014-09-11 NOTE — Progress Notes (Unsigned)
Pt called stating she was seen in ED on 8/2 and told she had low sodium. Then today her GI doctor called and cancelled her colonoscopy. She is very upset and wants to know what is wrong.  She states she is usually running around but has slowed down.  The daughter is yelling in the background.   Pt given an appointment for tomorrow to f/u on labs and ED f/u.

## 2014-09-11 NOTE — ED Notes (Addendum)
Pt reports feeling "shaking" pt states that she thinks her sodium is low. Pt states that she also has continued rt shoulder pain. Pt also reports rt sided abdominal pain

## 2014-09-12 ENCOUNTER — Encounter: Payer: Self-pay | Admitting: Internal Medicine

## 2014-09-12 ENCOUNTER — Ambulatory Visit (INDEPENDENT_AMBULATORY_CARE_PROVIDER_SITE_OTHER): Payer: No Typology Code available for payment source | Admitting: Internal Medicine

## 2014-09-12 VITALS — BP 138/63 | HR 90 | Temp 98.0°F | Wt 164.0 lb

## 2014-09-12 DIAGNOSIS — E1122 Type 2 diabetes mellitus with diabetic chronic kidney disease: Secondary | ICD-10-CM

## 2014-09-12 DIAGNOSIS — M549 Dorsalgia, unspecified: Secondary | ICD-10-CM

## 2014-09-12 DIAGNOSIS — M546 Pain in thoracic spine: Secondary | ICD-10-CM

## 2014-09-12 DIAGNOSIS — N183 Chronic kidney disease, stage 3 (moderate): Secondary | ICD-10-CM

## 2014-09-12 DIAGNOSIS — E1322 Other specified diabetes mellitus with diabetic chronic kidney disease: Secondary | ICD-10-CM

## 2014-09-12 DIAGNOSIS — E871 Hypo-osmolality and hyponatremia: Secondary | ICD-10-CM

## 2014-09-12 LAB — GLUCOSE, CAPILLARY: GLUCOSE-CAPILLARY: 248 mg/dL — AB (ref 65–99)

## 2014-09-12 NOTE — Patient Instructions (Signed)
Get blood work drawn.    Return to clinic in 1 month  Hyponatremia  Hyponatremia is when the amount of salt (sodium) in your blood is too low. When sodium levels are low, your cells will absorb extra water and swell. The swelling happens throughout the body, but it mostly affects the brain. Severe brain swelling (cerebral edema), seizures, or coma can happen.  CAUSES   Heart, kidney, or liver problems.  Thyroid problems.  Adrenal gland problems.  Severe vomiting and diarrhea.  Certain medicines or illegal drugs.  Dehydration.  Drinking too much water.  Low-sodium diet. SYMPTOMS   Nausea and vomiting.  Confusion.  Lethargy.  Agitation.  Headache.  Twitching or shaking (seizures).  Unconsciousness.  Appetite loss.  Muscle weakness and cramping. DIAGNOSIS  Hyponatremia is identified by a simple blood test. Your caregiver will perform a history and physical exam to try to find the cause and type of hyponatremia. Other tests may be needed to measure the amount of sodium in your blood and urine. TREATMENT  Treatment will depend on the cause.   Fluids may be given through the vein (IV).  Medicines may be used to correct the sodium imbalance. If medicines are causing the problem, they will need to be adjusted.  Water or fluid intake may be restricted to restore proper balance. The speed of correcting the sodium problem is very important. If the problem is corrected too fast, nerve damage (sometimes unchangeable) can happen. HOME CARE INSTRUCTIONS   Only take medicines as directed by your caregiver. Many medicines can make hyponatremia worse. Discuss all your medicines with your caregiver.  Carefully follow any recommended diet, including any fluid restrictions.  You may be asked to repeat lab tests. Follow these directions.  Avoid alcohol and recreational drugs. SEEK MEDICAL CARE IF:   You develop worsening nausea, fatigue, headache, confusion, or  weakness.  Your original hyponatremia symptoms return.  You have problems following the recommended diet. SEEK IMMEDIATE MEDICAL CARE IF:   You have a seizure.  You faint.  You have ongoing diarrhea or vomiting. MAKE SURE YOU:   Understand these instructions.  Will watch your condition.  Will get help right away if you are not doing well or get worse. Document Released: 01/14/2002 Document Revised: 04/18/2011 Document Reviewed: 07/11/2010 [facility] Patient Information 2015 [place], [place]. This information is not intended to replace advice given to you by your health care provider. Make sure you discuss any questions you have with your health care provider.

## 2014-09-12 NOTE — Progress Notes (Signed)
Patient ID: [name], female   DOB: [birth year], 57 y.o.   MRN: [id number]    Subjective:   Patient ID: [name] female   DOB: [birth year] 59 y.o.   MRN: [id number]  HPI: [name] is a 59 y.o. female with PMH listed below, here today for followup ED visit for hyponatremia.   Please refer to Problem-Based charting for further details.    Past Medical History  Diagnosis Date  . Diabetes mellitus type II, uncontrolled   . Hypertension   . Hyperlipidemia   . Ovarian cyst, left   . Anemia     due to menorrhagia, BL 8-10  . Vaginal cyst     nabothian and bartholin  . CKD (chronic kidney disease) stage 3, GFR 30-59 ml/min     baseline creatinine 1.4-1.7  . Anxiety   . Depression   . Postmenopausal bleeding 06/12/2008  . 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 UTI (lower urinary tract infection)   . IBS (irritable bowel syndrome)   . Chronic back pain   . Chronic abdominal pain   . DDD (degenerative disc disease), lumbar   . Bilateral renal cysts 01/15/2009    Qualifier: Diagnosis of  By: Tyrell Antonio MD, Belkys    . Diabetes mellitus without complication   . History of palpitations     evaluated recently 12'15  . Colitis    Current Outpatient Prescriptions  Medication Sig Dispense Refill  . acetaminophen (TYLENOL) 500 MG tablet Take 1,000 mg by mouth every 6 (six) hours as needed for moderate pain.     . Capsaicin-Menthol-Methyl Sal (CAPSAICIN-METHYL SAL-MENTHOL) 0.025-1-12 % CREA Apply 1 application topically 2 (two) times daily. Apply to the affected area (Patient not taking: Reported on 09/04/2014) 1 Tube 0  . cephALEXin (KEFLEX) 500 MG capsule Take 1 capsule (500 mg total) by mouth 4 (four) times daily. 40 capsule 0  . dicyclomine (BENTYL) 20 MG tablet Take 1 tablet (20 mg total) by mouth every 6 (six) hours as needed for spasms (for abdominal cramping). 20 tablet 0  . docusate sodium (COLACE) 100 MG capsule Take 1 capsule (100 mg total) by  mouth every 12 (twelve) hours. (Patient not taking: Reported on 09/04/2014) 60 capsule 0  . enalapril (VASOTEC) 10 MG tablet Take 1 tablet (10 mg total) by mouth daily. 30 tablet 6  . famotidine (PEPCID) 40 MG tablet Take 40 mg by mouth daily.    . Insulin Glargine (LANTUS SOLOSTAR) 100 UNIT/ML Solostar Pen Inject 22 Units into the skin daily at 10 pm. 15 mL 11  . methocarbamol (ROBAXIN) 500 MG tablet Take 1 tablet (500 mg total) by mouth 2 (two) times daily as needed for muscle spasms. (Patient not taking: Reported on 09/05/2014) 20 tablet 0  . ondansetron (ZOFRAN-ODT) 8 MG disintegrating tablet Take 1 tablet (8 mg total) by mouth every 8 (eight) hours as needed for nausea or vomiting. 20 tablet 0  . pioglitazone (ACTOS) 15 MG tablet Take 2 tablets (30 mg total) by mouth daily. (Patient taking differently: Take 15 mg by mouth daily. ) 30 tablet 2  . polyethylene glycol (MIRALAX / GLYCOLAX) packet Take 17 g by mouth daily. 30 each 0  . simvastatin (ZOCOR) 40 MG tablet TAKE 1 TABLET (40 MG TOTAL) BY MOUTH AT BEDTIME. (Patient taking differently: Take 40 mg by mouth at bedtime. ) 30 tablet 5  . [DISCONTINUED] pantoprazole (PROTONIX) 20 MG tablet Take 2 tablets (40 mg total) by  mouth daily. 30 tablet 1  . [DISCONTINUED] sertraline (ZOLOFT) 100 MG tablet Take 1 tablet (100 mg total) by mouth daily. 30 tablet 2   No current facility-administered medications for this visit.   Family History  Problem Relation Age of Onset  . Stroke Father   . Heart attack Father     Had MI in his 63s  . Stomach cancer Paternal Grandmother   . Diabetes Maternal Grandmother   . Cerebral palsy Daughter   . Anesthesia problems Neg Hx   . Hypotension Neg Hx   . Malignant hyperthermia Neg Hx   . Pseudochol deficiency Neg Hx    History   Social History  . Marital Status: Divorced    Spouse Name: N/A  . Number of Children: 1  . Years of Education: 12th grade   Occupational History  . 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 Smoking status: Never Smoker   . Smokeless tobacco: Never Used  . Alcohol Use: No  . Drug Use: No  . Sexual Activity: No   Other Topics Concern  . 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   Review of Systems: A comprehensive review of systems was negative. Objective:  Physical Exam: Filed Vitals:   09/12/14 1458  BP: 138/63  Pulse: 90  Temp: 98 F (36.7 C)  TempSrc: Oral  Weight: 164 lb (74.39 kg)  SpO2: 99%   BP 138/63 mmHg  Pulse 90  Temp(Src) 98 F (36.7 C) (Oral)  Wt 164 lb (74.39 kg)  SpO2 99% General appearance: alert, cooperative and no distress Throat: no thyromegaly Lungs: clear to auscultation bilaterally Heart: regular rate and rhythm, S1, S2 normal, no murmur, click, rub or gallop Abdomen: soft, non-tender; bowel sounds normal; no masses,  no organomegaly Extremities: extremities normal, atraumatic, no cyanosis or edema Neurologic: Alert and oriented X 3, normal strength and tone. Normal symmetric reflexes. Normal coordination and gait  MSK: RUE with full ROM without pain.  No signs of impingement.  Assessment & Plan:   Patient and case were discussed with Dr. Evette Doffing.  Please refer to Problem Based charting for further documentation.

## 2014-09-12 NOTE — Progress Notes (Addendum)
Internal Medicine Clinic Attending  I saw and evaluated the patient.  I personally confirmed the key portions of the history and exam documented by Dr. Lovena Le and I reviewed pertinent patient test results.  The assessment, diagnosis, and plan were formulated together and I agree with the documentation in the resident's note.  Patient with progressive hyponatremia since 2015 without a clear etiology yet. She is euvolemic on exam today. Not on an SSRI or other medication associated with SIADH. Previous urine osmolality low to normal, not suggesting inappropriate ADH. We are going to complete the work up today with simultaneous serum and urine sodium and osmolality, as well as TSH and random cortisol level. If cortisol is normal here, I would like to complete a cosyntropin stimulation test to evaluate for adrenal insufficiency. We also noticed a history of renal cysts with concerning features from 2010 that is in need of repeat imaging to evaluate for progression, so MRI abdomen will be scheduled in the coming weeks.   Addendum: Labs from this visit show persistent mild hyponatremia, normal serum osmolality, and mild hyperglycemia. She was euvolemic when these tests were drawn. Urine osmolality was mildly more dilute than the serum, making inappropriate ADH less likely. Urine sodium was normal making low solute intake unlikely. TSH was normal, as was random cortisol. At this point I would like to complete a cosyntropin stimulation test to further evaluate for adrenal insufficiency.

## 2014-09-12 NOTE — Assessment & Plan Note (Signed)
Right scapular pain:  Patient describes overall improvement of pain over the last two months. Pain is now intermittent and relieved with Tylenol. However, patient has been taking 1500 mg Tylenol once daily.  Pain is reproducible with palpation of medial border of scapula.  No limitations to ROM or strength.  Assessment: muscle strain vs peripheral nerve impingement  Plan: Continued use of arm to perform daily activities.  Take Tylenol 500 mg every 4 hours.  Do not exceed 1 g Tylenol at any given time and 3 g Tylenol in a single day.

## 2014-09-12 NOTE — Assessment & Plan Note (Addendum)
Patient has had multiple visits to ED and clinic showing chronic hyponatremia.  Patient is asymptomatic.  As this has been chronic, and patient has only presented to ED for evaluation, it has not been fully worked up.  Differential for euvolemic hyponatremia includes SIADH, thyroid, and hyperglycemia.  Corrected Na given her DMII is only 130, which is still low.  Will check first set of labs and escalate investigation from there.  Patient also has history of complex renal cysts diagnosed in 2010.  At that time, follow-up was recommended at 6 months with repeat MRI abdomen with and without contrast.  Will obtain MRI to evaluate stability of cysts.  Plan: Random Cortisol Serum/Urine Osms and Na TSH MRI abdomen with and without contrast  RTC: 1 month

## 2014-09-12 NOTE — Addendum Note (Signed)
Addended by: Viviano Simas A on: 09/12/2014 04:37 PM   Modules accepted: Orders

## 2014-09-13 LAB — BASIC METABOLIC PANEL
BUN / CREAT RATIO: 18 (ref 9–23)
BUN: 23 mg/dL (ref 6–24)
CHLORIDE: 88 mmol/L — AB (ref 97–108)
CO2: 28 mmol/L (ref 18–29)
Calcium: 9.7 mg/dL (ref 8.7–10.2)
Creatinine, Ser: 1.31 mg/dL — ABNORMAL HIGH (ref 0.57–1.00)
GFR calc non Af Amer: 45 mL/min/{1.73_m2} — ABNORMAL LOW (ref >59)
GFR, EST AFRICAN AMERICAN: 52 mL/min/{1.73_m2} — AB (ref >59)
GLUCOSE: 233 mg/dL — AB (ref 65–99)
POTASSIUM: 4.6 mmol/L (ref 3.5–5.2)
Sodium: 131 mmol/L — ABNORMAL LOW (ref 134–144)

## 2014-09-13 LAB — TSH: TSH: 1.22 u[IU]/mL (ref 0.450–4.500)

## 2014-09-13 LAB — CORTISOL: CORTISOL: 12.6 ug/dL

## 2014-09-13 LAB — SODIUM, URINE, RANDOM: Sodium, Ur: 67 mmol/L

## 2014-09-14 LAB — OSMOLALITY: Osmolality Meas: 286 mOsmol/kg (ref 275–295)

## 2014-09-14 LAB — OSMOLALITY, URINE: Osmolality, Ur: 279 mOsmol/kg

## 2014-09-16 ENCOUNTER — Telehealth: Payer: Self-pay | Admitting: *Deleted

## 2014-09-16 ENCOUNTER — Other Ambulatory Visit: Payer: Self-pay | Admitting: Student in an Organized Health Care Education/Training Program

## 2014-09-16 ENCOUNTER — Telehealth: Payer: Self-pay | Admitting: Internal Medicine

## 2014-09-16 ENCOUNTER — Other Ambulatory Visit: Payer: Self-pay | Admitting: Internal Medicine

## 2014-09-16 DIAGNOSIS — E871 Hypo-osmolality and hyponatremia: Secondary | ICD-10-CM

## 2014-09-16 NOTE — H&P (Signed)
  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 Please draw a baseline cortisol level. Then administer 274mcg of Cosyntropin by IV. Then measure two more repeat serum cortisol levels at 30 and 60 minutes after the medication infusion.

## 2014-09-16 NOTE — Telephone Encounter (Signed)
Spoke with patient regarding her laboratory results.  The results do not point to any particular underlying etiology.  Therefore, we will continue with the cosyntropin stimulation test in order to rule out adrenal insufficiency.  She was informed that the hospital will call her in order to schedule the test.  She acknowledged understanding.

## 2014-09-16 NOTE — Telephone Encounter (Signed)
Call made to Short Stay (Section B) to schedule pt cosyntropin stimulation test.  Message left on short stay recorder to contact pt with an appointment.  They will also contact the clinic if they are unable to see order.Regenia Skeeter, Clotine Heiner Cassady8/9/20165:02 PM

## 2014-09-17 ENCOUNTER — Encounter (HOSPITAL_COMMUNITY): Payer: Self-pay | Admitting: Emergency Medicine

## 2014-09-17 ENCOUNTER — Emergency Department (INDEPENDENT_AMBULATORY_CARE_PROVIDER_SITE_OTHER)
Admission: EM | Admit: 2014-09-17 | Discharge: 2014-09-17 | Disposition: A | Discharge status: Home / Self Care | Payer: No Typology Code available for payment source | Source: Home / Self Care | Attending: Family Medicine | Admitting: Family Medicine

## 2014-09-17 DIAGNOSIS — F419 Anxiety disorder, unspecified: Secondary | ICD-10-CM

## 2014-09-17 DIAGNOSIS — M791 Myalgia: Secondary | ICD-10-CM

## 2014-09-17 DIAGNOSIS — M7918 Myalgia, other site: Secondary | ICD-10-CM

## 2014-09-17 MED ORDER — DICLOFENAC SODIUM 1 % TD GEL: 2.0000 g | Freq: Four times a day (QID) | TRANSDERMAL | Status: DC

## 2014-09-17 NOTE — ED Notes (Signed)
Pt states she does not feel like herself today.  She has been feeling very nauseous but is unable to vomit.  She also complains of continued pain in her right shoulder.

## 2014-09-17 NOTE — Discharge Instructions (Signed)
The cause of your symptoms is not immediately clear but may be due to a mild muscle strain. This would benefit from using a gallon jug her pain can and making pendulum motions with your arm as well as applying heat and massage to the affected area. There is no sign of significant injury or permanent abnormality to the area. This may also be due to your diabetes and ongoing neuropathy though this is less likely area did this is also very likely due to your anxiety and stress. Remember to take time for yourself and relax as much as possible. Please also consider starting the Robaxin. If the Voltaren gel was too expensive you can always buy over-the-counter Aspercreme.

## 2014-09-17 NOTE — ED Provider Notes (Signed)
CSN: [id number]     Arrival date & time 09/17/14  1725 History   None    Chief Complaint  Patient presents with  . Nausea  . Weakness  . Shoulder Pain   (Consider location/radiation/quality/duration/timing/severity/associated sxs/prior Treatment) HPI R shoulder pain: started 2 mo ago. Resolved after 1 week. Current episode of shoulder pain started 2-3 wks ago. Constant. Nothing makes it better or worse. Tylenol w/ arthritis w/o benefit. Did not take the robaxin that was prescribed after her last ED visit. Slowly getting better. Not able to sleep at night due to the pain.  Denies chest pain, shortness of breath, palpitations, injury to the back or shoulder, neck stiffness, headache, LOC.   Past Medical History  Diagnosis Date  . Diabetes mellitus type II, uncontrolled   . Hypertension   . Hyperlipidemia   . Ovarian cyst, left   . Anemia     due to menorrhagia, BL 8-10  . Vaginal cyst     nabothian and bartholin  . CKD (chronic kidney disease) stage 3, GFR 30-59 ml/min     baseline creatinine 1.4-1.7  . Anxiety   . Depression   . Postmenopausal bleeding 06/12/2008  . 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 UTI (lower urinary tract infection)   . IBS (irritable bowel syndrome)   . Chronic back pain   . Chronic abdominal pain   . DDD (degenerative disc disease), lumbar   . Bilateral renal cysts 01/15/2009    Qualifier: Diagnosis of  By: Tyrell Antonio MD, Belkys    . Diabetes mellitus without complication   . History of palpitations     evaluated recently 12'15  . Colitis    Past Surgical History  Procedure Laterality Date  . Cardiac surgery      to repair congenital defect as a child- 18months old   Family History  Problem Relation Age of Onset  . Stroke Father   . Heart attack Father     Had MI in his 87s  . Stomach cancer Paternal Grandmother   . Diabetes Maternal Grandmother   . Cerebral palsy Daughter   . Anesthesia problems Neg Hx   .  Hypotension Neg Hx   . Malignant hyperthermia Neg Hx   . Pseudochol deficiency Neg Hx    Social History  Substance Use Topics  . Smoking status: Never Smoker   . Smokeless tobacco: Never Used  . Alcohol Use: No   OB History    Gravida Para Term Preterm AB TAB SAB Ectopic Multiple Living   3 1 1  2  2   1      Review of Systems Per HPI with all other pertinent systems negative.   Allergies  Review of patient's allergies indicates no known allergies.  Home Medications   Prior to Admission medications   Medication Sig Start Date End Date Taking? Authorizing Provider  acetaminophen (TYLENOL) 500 MG tablet Take 1,000 mg by mouth every 6 (six) hours as needed for moderate pain.     Historical Provider, MD  Capsaicin-Menthol-Methyl Sal (CAPSAICIN-METHYL SAL-MENTHOL) 0.025-1-12 % CREA Apply 1 application topically 2 (two) times daily. Apply to the affected area Patient not taking: Reported on 09/04/2014 09/02/14   Sindy Guadeloupe Rivet, MD  cephALEXin (KEFLEX) 500 MG capsule Take 1 capsule (500 mg total) by mouth 4 (four) times daily. 09/06/14   Noemi Chapel, MD  diclofenac sodium (VOLTAREN) 1 % GEL Apply 2-4 g topically 4 (four) times daily. 09/17/14  Waldemar Dickens, MD  dicyclomine (BENTYL) 20 MG tablet Take 1 tablet (20 mg total) by mouth every 6 (six) hours as needed for spasms (for abdominal cramping). 09/09/14   Linton Flemings, MD  docusate sodium (COLACE) 100 MG capsule Take 1 capsule (100 mg total) by mouth every 12 (twelve) hours. Patient not taking: Reported on 09/04/2014 08/31/14   Kristen N Ward, DO  enalapril (VASOTEC) 10 MG tablet Take 1 tablet (10 mg total) by mouth daily. 06/08/14   Juliet Rude, MD  famotidine (PEPCID) 40 MG tablet Take 40 mg by mouth daily.    Historical Provider, MD  Insulin Glargine (LANTUS SOLOSTAR) 100 UNIT/ML Solostar Pen Inject 22 Units into the skin daily at 10 pm. 06/10/14   Juliet Rude, MD  methocarbamol (ROBAXIN) 500 MG tablet Take 1 tablet (500 mg total) by mouth  2 (two) times daily as needed for muscle spasms. Patient not taking: Reported on 09/05/2014 09/02/14   Waynetta Pean, PA-C  ondansetron (ZOFRAN-ODT) 8 MG disintegrating tablet Take 1 tablet (8 mg total) by mouth every 8 (eight) hours as needed for nausea or vomiting. 09/09/14   Linton Flemings, MD  pioglitazone (ACTOS) 15 MG tablet Take 2 tablets (30 mg total) by mouth daily. Patient taking differently: Take 15 mg by mouth daily.  08/26/14   Carly Montey Hora, MD  polyethylene glycol (MIRALAX / GLYCOLAX) packet Take 17 g by mouth daily. 09/09/14   Linton Flemings, MD  simvastatin (ZOCOR) 40 MG tablet TAKE 1 TABLET (40 MG TOTAL) BY MOUTH AT BEDTIME. Patient taking differently: Take 40 mg by mouth at bedtime.  11/26/13   [name], MD   BP 162/71 mmHg  Pulse 94  Temp(Src) 98.5 F (36.9 C) (Oral)  Resp 18  SpO2 100% Physical Exam Physical Exam  Constitutional: oriented to person, place, and time. appears well-developed and well-nourished. No distress.  HENT:  Head: Normocephalic and atraumatic.  Eyes: EOMI. PERRL.  Neck: Normal range of motion.  Cardiovascular: RRR, no m/r/g, 2+ distal pulses,  Pulmonary/Chest: Effort normal and breath sounds normal. No respiratory distress.  Abdominal: Soft. Bowel sounds are normal. NonTTP, no distension.  Musculoskeletal: Normal range of motion. Non ttp, no effusion.  Neurological: alert and oriented to person, place, and time.  Skin: Skin is warm. No rash noted. non diaphoretic.  Psychiatric: normal mood and affect. behavior is normal. Judgment and thought content normal.   ED Course  Procedures (including critical care time) Labs Review Labs Reviewed - No data to display  Imaging Review No results found.   MDM   1. Anxiety   2. Musculoskeletal pain     Patient openly admits to being very stressed and anxious. There is some pressured speech as she discusses her problems. I suspect the vast majority of her complaints are due to anxiety and stress.  There may be a small underlying musculoskeletal component to her pain and she would benefit from specific stretching and exercises such as pendulum motions wall holding a weight, applying heat and massage to the affected area. Patient has not started the Robaxin and she was instructed after last ED visit and I counseled her to do so. I will also prescribe voltaren gel to be used though she can substitute this for Aspercreme if it is expensive. Counseled patient very heavily to manage her stress and to take things in stride. Significant reassurance given. Patient states that she knows that a lot of her physical complaints are probably due to stress  and anxiety. Patient's blood pressure likely elevated secondary to anxiety state. Continue current medications. Follow-up as needed.   Waldemar Dickens, MD 09/17/14 [phone]

## 2014-09-23 ENCOUNTER — Other Ambulatory Visit: Payer: Self-pay | Admitting: Gastroenterology

## 2014-09-26 ENCOUNTER — Encounter (HOSPITAL_COMMUNITY): Payer: Self-pay | Admitting: *Deleted

## 2014-09-29 ENCOUNTER — Emergency Department (HOSPITAL_COMMUNITY)
Admission: EM | Admit: 2014-09-29 | Discharge: 2014-09-29 | Disposition: A | Discharge status: Home / Self Care | Payer: No Typology Code available for payment source | Attending: Emergency Medicine | Admitting: Emergency Medicine

## 2014-09-29 ENCOUNTER — Encounter (HOSPITAL_COMMUNITY): Payer: Self-pay

## 2014-09-29 DIAGNOSIS — I129 Hypertensive chronic kidney disease with stage 1 through stage 4 chronic kidney disease, or unspecified chronic kidney disease: Secondary | ICD-10-CM | POA: Insufficient documentation

## 2014-09-29 DIAGNOSIS — G8929 Other chronic pain: Secondary | ICD-10-CM | POA: Insufficient documentation

## 2014-09-29 DIAGNOSIS — Q249 Congenital malformation of heart, unspecified: Secondary | ICD-10-CM | POA: Insufficient documentation

## 2014-09-29 DIAGNOSIS — Z862 Personal history of diseases of the blood and blood-forming organs and certain disorders involving the immune mechanism: Secondary | ICD-10-CM | POA: Insufficient documentation

## 2014-09-29 DIAGNOSIS — Z8742 Personal history of other diseases of the female genital tract: Secondary | ICD-10-CM | POA: Insufficient documentation

## 2014-09-29 DIAGNOSIS — E785 Hyperlipidemia, unspecified: Secondary | ICD-10-CM | POA: Insufficient documentation

## 2014-09-29 DIAGNOSIS — Z79899 Other long term (current) drug therapy: Secondary | ICD-10-CM | POA: Insufficient documentation

## 2014-09-29 DIAGNOSIS — Z8739 Personal history of other diseases of the musculoskeletal system and connective tissue: Secondary | ICD-10-CM | POA: Insufficient documentation

## 2014-09-29 DIAGNOSIS — E119 Type 2 diabetes mellitus without complications: Secondary | ICD-10-CM | POA: Insufficient documentation

## 2014-09-29 DIAGNOSIS — R350 Frequency of micturition: Secondary | ICD-10-CM | POA: Insufficient documentation

## 2014-09-29 DIAGNOSIS — Z794 Long term (current) use of insulin: Secondary | ICD-10-CM | POA: Insufficient documentation

## 2014-09-29 DIAGNOSIS — Z8719 Personal history of other diseases of the digestive system: Secondary | ICD-10-CM | POA: Insufficient documentation

## 2014-09-29 DIAGNOSIS — Z87440 Personal history of urinary (tract) infections: Secondary | ICD-10-CM | POA: Insufficient documentation

## 2014-09-29 DIAGNOSIS — Z8659 Personal history of other mental and behavioral disorders: Secondary | ICD-10-CM | POA: Insufficient documentation

## 2014-09-29 DIAGNOSIS — N183 Chronic kidney disease, stage 3 (moderate): Secondary | ICD-10-CM | POA: Insufficient documentation

## 2014-09-29 LAB — URINE MICROSCOPIC-ADD ON

## 2014-09-29 LAB — CBG MONITORING, ED: Glucose-Capillary: 207 mg/dL — ABNORMAL HIGH (ref 65–99)

## 2014-09-29 LAB — URINALYSIS, ROUTINE W REFLEX MICROSCOPIC
Bilirubin Urine: NEGATIVE
Glucose, UA: NEGATIVE mg/dL
Hgb urine dipstick: NEGATIVE
Ketones, ur: NEGATIVE mg/dL
Nitrite: NEGATIVE
PROTEIN: NEGATIVE mg/dL
SPECIFIC GRAVITY, URINE: 1.009 (ref 1.005–1.030)
UROBILINOGEN UA: 0.2 mg/dL (ref 0.0–1.0)
pH: 5.5 (ref 5.0–8.0)

## 2014-09-29 NOTE — ED Notes (Signed)
Per pt, has had increase in urination since last night. No fever or abdominal pain.  Frequent UTI.  No burning with urination.

## 2014-09-29 NOTE — ED Provider Notes (Signed)
CSN: [id number]     Arrival date & time 09/29/14  0919 History   First MD Initiated Contact with Patient 09/29/14 [phone]     Chief Complaint  Patient presents with  . Urinary Frequency     (Consider location/radiation/quality/duration/timing/severity/associated sxs/prior Treatment) HPI Comments: 59 year old female with history of lipids, chronic kidney disease, diabetes, high blood pressure, urge incontinence presents with urinary frequency worsening since yesterday. Patient said similar in the past and has had urine infection the past. Glucose is overall controlled patient ate prior to coming. No fevers or chills per mild low back pain no flank pain.  Patient is a 59 y.o. female presenting with frequency. The history is provided by the patient.  Urinary Frequency Pertinent negatives include no chest pain, no abdominal pain, no headaches and no shortness of breath.    Past Medical History  Diagnosis Date  . Diabetes mellitus type II, uncontrolled   . Hypertension   . Hyperlipidemia   . Ovarian cyst, left   . Anemia     due to menorrhagia, BL 8-10  . Vaginal cyst     nabothian and bartholin  . Anxiety   . Depression   . Postmenopausal bleeding 06/12/2008  . 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 UTI (lower urinary tract infection)   . IBS (irritable bowel syndrome)   . Chronic back pain   . Chronic abdominal pain   . DDD (degenerative disc disease), lumbar   . Diabetes mellitus without complication   . History of palpitations     evaluated recently 12'15  . Colitis   . CKD (chronic kidney disease) stage 3, GFR 30-59 ml/min     baseline creatinine 1.4-1.7  . Bilateral renal cysts 01/15/2009    Qualifier: Diagnosis of  By: Tyrell Antonio MD, Jerald Kief     Past Surgical History  Procedure Laterality Date  . Cardiac surgery      to repair congenital defect as a child- 10months old   Family History  Problem Relation Age of Onset  . Stroke Father   . Heart  attack Father     Had MI in his 42s  . Stomach cancer Paternal Grandmother   . Diabetes Maternal Grandmother   . Cerebral palsy Daughter   . Anesthesia problems Neg Hx   . Hypotension Neg Hx   . Malignant hyperthermia Neg Hx   . Pseudochol deficiency Neg Hx    Social History  Substance Use Topics  . Smoking status: Never Smoker   . Smokeless tobacco: Never Used  . Alcohol Use: No   OB History    Gravida Para Term Preterm AB TAB SAB Ectopic Multiple Living   3 1 1  2  2   1      Review of Systems  Constitutional: Negative for fever and chills.  Respiratory: Negative for shortness of breath.   Cardiovascular: Negative for chest pain.  Gastrointestinal: Negative for vomiting and abdominal pain.  Genitourinary: Positive for frequency. Negative for dysuria and flank pain.  Musculoskeletal: Positive for back pain. Negative for neck pain and neck stiffness.  Skin: Negative for rash.  Neurological: Negative for light-headedness and headaches.      Allergies  Review of patient's allergies indicates no known allergies.  Home Medications   Prior to Admission medications   Medication Sig Start Date End Date Taking? Authorizing Provider  acetaminophen (TYLENOL) 650 MG CR tablet Take 650 mg by mouth every 8 (eight) hours as needed for  pain.   Yes Historical Provider, MD  dicyclomine (BENTYL) 20 MG tablet Take 1 tablet (20 mg total) by mouth every 6 (six) hours as needed for spasms (for abdominal cramping). 09/09/14  Yes Linton Flemings, MD  enalapril (VASOTEC) 10 MG tablet Take 1 tablet (10 mg total) by mouth daily. 06/08/14  Yes Carly Montey Hora, MD  Insulin Glargine (LANTUS SOLOSTAR) 100 UNIT/ML Solostar Pen Inject 22 Units into the skin daily at 10 pm. 06/10/14  Yes Carly J Rivet, MD  pioglitazone (ACTOS) 15 MG tablet Take 2 tablets (30 mg total) by mouth daily. Patient taking differently: Take 15 mg by mouth daily.  08/26/14  Yes Carly J Rivet, MD  simvastatin (ZOCOR) 40 MG tablet TAKE 1 TABLET  (40 MG TOTAL) BY MOUTH AT BEDTIME. Patient taking differently: Take 40 mg by mouth at bedtime.  11/26/13  Yes [name], MD  sucralfate (CARAFATE) 1 G tablet Take 1 g by mouth 4 (four) times daily as needed (ulcers).   Yes Historical Provider, MD  Capsaicin-Menthol-Methyl Sal (CAPSAICIN-METHYL SAL-MENTHOL) 0.025-1-12 % CREA Apply 1 application topically 2 (two) times daily. Apply to the affected area Patient not taking: Reported on 09/04/2014 09/02/14   Sindy Guadeloupe Rivet, MD  cephALEXin (KEFLEX) 500 MG capsule Take 1 capsule (500 mg total) by mouth 4 (four) times daily. Patient not taking: Reported on 09/29/2014 09/06/14   Noemi Chapel, MD  diclofenac sodium (VOLTAREN) 1 % GEL Apply 2-4 g topically 4 (four) times daily. Patient not taking: Reported on 09/29/2014 09/17/14   Waldemar Dickens, MD  docusate sodium (COLACE) 100 MG capsule Take 1 capsule (100 mg total) by mouth every 12 (twelve) hours. Patient not taking: Reported on 09/04/2014 08/31/14   Delice Bison Ward, DO  methocarbamol (ROBAXIN) 500 MG tablet Take 1 tablet (500 mg total) by mouth 2 (two) times daily as needed for muscle spasms. Patient not taking: Reported on 09/05/2014 09/02/14   Waynetta Pean, PA-C  ondansetron (ZOFRAN-ODT) 8 MG disintegrating tablet Take 1 tablet (8 mg total) by mouth every 8 (eight) hours as needed for nausea or vomiting. Patient not taking: Reported on 09/29/2014 09/09/14   Linton Flemings, MD  polyethylene glycol [facility] / Floria Raveling) packet Take 17 g by mouth daily. Patient not taking: Reported on 09/29/2014 09/09/14   Linton Flemings, MD   BP 153/68 mmHg  Pulse 84  Temp(Src) 98.2 F (36.8 C) (Oral)  Resp 18  SpO2 100% Physical Exam  Constitutional: She is oriented to person, place, and time. She appears well-developed and well-nourished.  HENT:  Head: Normocephalic and atraumatic.  Eyes: Right eye exhibits no discharge. Left eye exhibits no discharge.  Neck: Neck supple.  Cardiovascular: Normal rate.    Pulmonary/Chest: Effort normal.  Abdominal: Soft. She exhibits no distension. There is no tenderness. There is no guarding.  Musculoskeletal: She exhibits no edema or tenderness (no flank pain bilateral).  Neurological: She is alert and oriented to person, place, and time.  Skin: Skin is warm. No rash noted.  Psychiatric: She has a normal mood and affect.  Nursing note and vitals reviewed.   ED Course  Procedures (including critical care time) Labs Review Labs Reviewed  URINALYSIS, ROUTINE W REFLEX MICROSCOPIC (NOT AT [facility]) - Abnormal; Notable for the following:    Leukocytes, UA SMALL (*)    All other components within normal limits  URINE MICROSCOPIC-ADD ON - Abnormal; Notable for the following:    Squamous Epithelial / LPF FEW (*)    All other  components within normal limits  CBG MONITORING, ED - Abnormal; Notable for the following:    Glucose-Capillary 207 (*)    All other components within normal limits  URINE CULTURE    Imaging Review No results found. I have personally reviewed and evaluated these images and lab results as part of my medical decision-making.   EKG Interpretation None      MDM   Final diagnoses:  Urinary frequency   Patient presents with concern for urine infection with urinary frequency similar to previous PA and well-appearing otherwise no clinical concern for pyelonephritis at this time. Glucose mildly elevated patient will follow this outpatient. Urine pending.  Results and differential diagnosis were discussed with the patient/parent/guardian. Xrays were independently reviewed by myself.  Close follow up outpatient was discussed, comfortable with the plan.   Medications - No data to display  Filed Vitals:   09/29/14 0928  BP: 153/68  Pulse: 84  Temp: 98.2 F (36.8 C)  TempSrc: Oral  Resp: 18  SpO2: 100%    Final diagnoses:  Urinary frequency      Elnora Morrison, MD 09/29/14 1116

## 2014-09-29 NOTE — Discharge Instructions (Signed)
If you were given medicines take as directed.  If you are on coumadin or contraceptives realize their levels and effectiveness is altered by many different medicines.  If you have any reaction (rash, tongues swelling, other) to the medicines stop taking and see a physician.    If your blood pressure was elevated in the ER make sure you follow up for management with a primary doctor or return for chest pain, shortness of breath or stroke symptoms.  Please follow up as directed and return to the ER or see a physician for new or worsening symptoms.  Thank you. Filed Vitals:   09/29/14 0928  BP: 153/68  Pulse: 84  Temp: 98.2 F (36.8 C)  TempSrc: Oral  Resp: 18  SpO2: 100%

## 2014-09-30 ENCOUNTER — Telehealth: Payer: Self-pay | Admitting: Internal Medicine

## 2014-09-30 ENCOUNTER — Other Ambulatory Visit: Payer: Self-pay | Admitting: Gastroenterology

## 2014-09-30 NOTE — Telephone Encounter (Signed)
Patient is having abdominal discomfort w/ bowel prep. Denies vomiting, hematochezia, melena, fever, chills, SOB, chest pain, dizziness or lightheadedness. Explained to patient that bowel prep can cause some abdominal discomfort as well as diarrhea. States she has never felt like this before. Can't explain her symptoms exactly, however, does state that her stomach is "burnin'". Scheduled for colonoscopy in AM. Instructed patient to come to urgent care or ED if symptoms become severe.  Natasha Bence, MD PGY-3, Internal Medicine Pager: [phone]

## 2014-10-01 ENCOUNTER — Encounter (HOSPITAL_COMMUNITY): Payer: Self-pay

## 2014-10-01 ENCOUNTER — Ambulatory Visit (HOSPITAL_COMMUNITY)
Admission: RE | Admit: 2014-10-01 | Discharge: 2014-10-01 | Disposition: A | Discharge status: Home / Self Care | Payer: Self-pay | Source: Ambulatory Visit | Attending: Gastroenterology | Admitting: Gastroenterology

## 2014-10-01 ENCOUNTER — Encounter (HOSPITAL_COMMUNITY)
Admission: RE | Disposition: A | Discharge status: Home / Self Care | Payer: Self-pay | Source: Ambulatory Visit | Attending: Gastroenterology

## 2014-10-01 ENCOUNTER — Ambulatory Visit (HOSPITAL_COMMUNITY): Payer: Self-pay | Admitting: Anesthesiology

## 2014-10-01 ENCOUNTER — Ambulatory Visit (HOSPITAL_COMMUNITY): Payer: No Typology Code available for payment source | Admitting: Anesthesiology

## 2014-10-01 DIAGNOSIS — N183 Chronic kidney disease, stage 3 (moderate): Secondary | ICD-10-CM | POA: Insufficient documentation

## 2014-10-01 DIAGNOSIS — I129 Hypertensive chronic kidney disease with stage 1 through stage 4 chronic kidney disease, or unspecified chronic kidney disease: Secondary | ICD-10-CM | POA: Insufficient documentation

## 2014-10-01 DIAGNOSIS — K219 Gastro-esophageal reflux disease without esophagitis: Secondary | ICD-10-CM | POA: Insufficient documentation

## 2014-10-01 DIAGNOSIS — K5900 Constipation, unspecified: Secondary | ICD-10-CM | POA: Insufficient documentation

## 2014-10-01 DIAGNOSIS — Z79899 Other long term (current) drug therapy: Secondary | ICD-10-CM | POA: Insufficient documentation

## 2014-10-01 DIAGNOSIS — E1122 Type 2 diabetes mellitus with diabetic chronic kidney disease: Secondary | ICD-10-CM | POA: Insufficient documentation

## 2014-10-01 DIAGNOSIS — Z7982 Long term (current) use of aspirin: Secondary | ICD-10-CM | POA: Insufficient documentation

## 2014-10-01 DIAGNOSIS — M1990 Unspecified osteoarthritis, unspecified site: Secondary | ICD-10-CM | POA: Insufficient documentation

## 2014-10-01 DIAGNOSIS — R935 Abnormal findings on diagnostic imaging of other abdominal regions, including retroperitoneum: Secondary | ICD-10-CM | POA: Insufficient documentation

## 2014-10-01 DIAGNOSIS — R1013 Epigastric pain: Secondary | ICD-10-CM | POA: Insufficient documentation

## 2014-10-01 DIAGNOSIS — Z794 Long term (current) use of insulin: Secondary | ICD-10-CM | POA: Insufficient documentation

## 2014-10-01 DIAGNOSIS — E785 Hyperlipidemia, unspecified: Secondary | ICD-10-CM | POA: Insufficient documentation

## 2014-10-01 HISTORY — PX: ESOPHAGOGASTRODUODENOSCOPY (EGD) WITH PROPOFOL: SHX5813

## 2014-10-01 HISTORY — PX: COLONOSCOPY WITH PROPOFOL: SHX5780

## 2014-10-01 LAB — URINE CULTURE

## 2014-10-01 SURGERY — ESOPHAGOGASTRODUODENOSCOPY (EGD) WITH PROPOFOL
Anesthesia: Monitor Anesthesia Care

## 2014-10-01 MED ORDER — SODIUM CHLORIDE 0.9 % IV SOLN: INTRAVENOUS | Status: DC

## 2014-10-01 MED ORDER — PROPOFOL 10 MG/ML IV BOLUS
INTRAVENOUS | Status: DC | PRN
  Administered 2014-10-01: 30 mg via INTRAVENOUS
  Administered 2014-10-01 (×2): 20 mg via INTRAVENOUS

## 2014-10-01 MED ORDER — PROPOFOL INFUSION 10 MG/ML OPTIME
INTRAVENOUS | Status: DC | PRN
  Administered 2014-10-01: 150 ug/kg/min via INTRAVENOUS

## 2014-10-01 MED ORDER — LACTATED RINGERS IV SOLN
INTRAVENOUS | Status: DC
  Administered 2014-10-01: 1000 mL via INTRAVENOUS

## 2014-10-01 MED ORDER — PROPOFOL 10 MG/ML IV BOLUS
INTRAVENOUS | Status: AC
  Filled 2014-10-01: qty 20

## 2014-10-01 SURGICAL SUPPLY — 24 items

## 2014-10-01 NOTE — Transfer of Care (Signed)
Immediate Anesthesia Transfer of Care Note  Patient: [name]  Procedure(s) Performed: Procedure(s): ESOPHAGOGASTRODUODENOSCOPY (EGD) WITH PROPOFOL (N/A) COLONOSCOPY WITH PROPOFOL (N/A)  Patient Location: PACU and Endoscopy Unit  Anesthesia Type:MAC  Level of Consciousness: sedated and patient cooperative  Airway & Oxygen Therapy: Patient Spontanous Breathing and Patient connected to nasal cannula oxygen  Post-op Assessment: Report given to RN and Post -op Vital signs reviewed and stable  Post vital signs: Reviewed and stable  Last Vitals:  Filed Vitals:   10/01/14 0753  BP: 162/70  Pulse: 101  Temp: 36.9 C  Resp: 18    Complications: No apparent anesthesia complications

## 2014-10-01 NOTE — Op Note (Signed)
[facility] [place], [address]   ENDOSCOPY PROCEDURE REPORT  PATIENT: [name], [name]  MR#: [id number] BIRTHDATE: Apr 16, 1955 , 37  yrs. old GENDER: female ENDOSCOPIST: Arta Silence, MD REFERRED BY:  Internal Medicine [facility] PROCEDURE DATE:  Oct 07, 2014 PROCEDURE:  EGD, diagnostic ASA CLASS:     Class III INDICATIONS:  epigastric abdominal pain. MEDICATIONS: Monitored anesthesia care TOPICAL ANESTHETIC:  DESCRIPTION OF PROCEDURE: After the risks benefits and alternatives of the procedure were thoroughly explained, informed consent was obtained.  The Pentax Gastroscope [id number] endoscope was introduced through the mouth and advanced to the second portion of the duodenum. The instrument was slowly withdrawn as the mucosa was fully examined. Estimated blood loss is zero unless otherwise noted in this procedure report.    Findings:  Normal esophagus; no esophageal stricture, ring, mass, varices, or esophagitis.  Normal stomach; no gastritis, ulcer, mass, or other pathology.  Normal pylorus.  Normal duodenum  to the second portion; no ulcer, mass, AVM or other pathology.  Normal retroflexed view of the cardia.              The scope was then withdrawn from the patient and the procedure completed.  COMPLICATIONS: There were no immediate complications.  ENDOSCOPIC IMPRESSION:     As above.  Normal endoscopy.  RECOMMENDATIONS:     1.  Watch for potential complications of procedure. 2.  Continue sucralfate on as-needed basis. 3.  Proceed with colonoscopy.  eSigned:  Arta Silence, MD Oct 07, 2014 11:55 AM   CC:  CPT CODES: ICD CODES:  The ICD and CPT codes recommended by this software are interpretations from the data that the clinical staff has captured with the software.  The verification of the translation of this report to the ICD and CPT codes and modifiers is the sole responsibility of the health care institution and  practicing physician where this report was generated.  [employer] will not be held responsible for the validity of the ICD and CPT codes included on this report.  AMA assumes no liability for data contained or not contained herein. CPT is a Designer, television/film set of the [employer].

## 2014-10-01 NOTE — Anesthesia Preprocedure Evaluation (Addendum)
Anesthesia Evaluation  Patient identified by MRN, date of birth, ID band Patient awake    Reviewed: Allergy & Precautions, H&P , NPO status , Patient's Chart, lab work & pertinent test results  Airway Mallampati: II  TM Distance: >3 FB Neck ROM: full    Dental no notable dental hx. (+) Dental Advisory Given, Teeth Intact   Pulmonary neg pulmonary ROS,  breath sounds clear to auscultation  Pulmonary exam normal       Cardiovascular Exercise Tolerance: Good hypertension, Pt. on medications Normal cardiovascular examRhythm:regular Rate:Normal     Neuro/Psych negative neurological ROS  negative psych ROS   GI/Hepatic negative GI ROS, Neg liver ROS, GERD-  Medicated and Controlled,  Endo/Other  diabetes, Well Controlled, Type 2, Insulin Dependent  Renal/GU Renal diseasenegative Renal ROSStage 3 kidney disease  negative genitourinary   Musculoskeletal   Abdominal   Peds  Hematology negative hematology ROS (+)   Anesthesia Other Findings   Reproductive/Obstetrics negative OB ROS                            Anesthesia Physical Anesthesia Plan  ASA: III  Anesthesia Plan: MAC   Post-op Pain Management:    Induction:   Airway Management Planned:   Additional Equipment:   Intra-op Plan:   Post-operative Plan:   Informed Consent: I have reviewed the patients History and Physical, chart, labs and discussed the procedure including the risks, benefits and alternatives for the proposed anesthesia with the patient or authorized representative who has indicated his/her understanding and acceptance.   Dental Advisory Given  Plan Discussed with: CRNA and Surgeon  Anesthesia Plan Comments:         Anesthesia Quick Evaluation

## 2014-10-01 NOTE — Discharge Instructions (Signed)
Colonoscopy, Care After °These instructions give you information on caring for yourself after your procedure. Your doctor may also give you more specific instructions. Call your doctor if you have any problems or questions after your procedure. °HOME CARE °· Do not drive for 24 hours. °· Do not sign important papers or use machinery for 24 hours. °· You may shower. °· You may go back to your usual activities, but go slower for the first 24 hours. °· Take rest breaks often during the first 24 hours. °· Walk around or use warm packs on your belly (abdomen) if you have belly cramping or gas. °· Drink enough fluids to keep your pee (urine) clear or pale yellow. °· Resume your normal diet. Avoid heavy or fried foods. °· Avoid drinking alcohol for 24 hours or as told by your doctor. °· Only take medicines as told by your doctor. °If a tissue sample (biopsy) was taken during the procedure:  °· Do not take aspirin or blood thinners for 7 days, or as told by your doctor. °· Do not drink alcohol for 7 days, or as told by your doctor. °· Eat soft foods for the first 24 hours. °GET HELP IF: °You still have a small amount of blood in your poop (stool) 2-3 days after the procedure. °GET HELP RIGHT AWAY IF: °· You have more than a small amount of blood in your poop. °· You see clumps of tissue (blood clots) in your poop. °· Your belly is puffy (swollen). °· You feel sick to your stomach (nauseous) or throw up (vomit). °· You have a fever. °· You have belly pain that gets worse and medicine does not help. °MAKE SURE YOU: °· Understand these instructions. °· Will watch your condition. °· Will get help right away if you are not doing well or get worse. °Document Released: 02/26/2010 Document Revised: 01/29/2013 Document Reviewed: 10/01/2012 °ExitCare® Patient Information ©2015 [employer]. This information is not intended to replace advice given to you by your health care provider. Make sure you discuss any questions you have with  your health care provider. °Esophagogastroduodenoscopy °Care After °Refer to this sheet in the next few weeks. These instructions provide you with information on caring for yourself after your procedure. Your caregiver may also give you more specific instructions. Your treatment has been planned according to current medical practices, but problems sometimes occur. Call your caregiver if you have any problems or questions after your procedure.  °HOME CARE INSTRUCTIONS °· Do not eat or drink anything until the numbing medicine (local anesthetic) has worn off and your gag reflex has returned. You will know that the local anesthetic has worn off when you can swallow comfortably. °· Do not drive for 12 hours after the procedure or as directed by your caregiver. °· Only take medicines as directed by your caregiver. °SEEK MEDICAL CARE IF:  °· You cannot stop coughing. °· You are not urinating at all or less than usual. °SEEK IMMEDIATE MEDICAL CARE IF: °· You have difficulty swallowing. °· You cannot eat or drink. °· You have worsening throat or chest pain. °· You have dizziness, lightheadedness, or you faint. °· You have nausea or vomiting. °· You have chills. °· You have a fever. °· You have severe abdominal pain. °· You have black, tarry, or bloody stools. °Document Released: 01/11/2012 Document Reviewed: 01/11/2012 °ExitCare® Patient Information ©2015 [employer]. This information is not intended to replace advice given to you by your health care provider. Make sure you   discuss any questions you have with your health care provider. ° °

## 2014-10-01 NOTE — Addendum Note (Signed)
Addended by: Arta Silence on: 10/01/2014 08:34 AM   Modules accepted: Orders

## 2014-10-01 NOTE — Anesthesia Postprocedure Evaluation (Signed)
  Anesthesia Post-op Note  Patient: [name]  Procedure(s) Performed: Procedure(s) (LRB): ESOPHAGOGASTRODUODENOSCOPY (EGD) WITH PROPOFOL (N/A) COLONOSCOPY WITH PROPOFOL (N/A)  Patient Location: PACU  Anesthesia Type: MAC  Level of Consciousness: awake and alert   Airway and Oxygen Therapy: Patient Spontanous Breathing  Post-op Pain: mild  Post-op Assessment: Post-op Vital signs reviewed, Patient's Cardiovascular Status Stable, Respiratory Function Stable, Patent Airway and No signs of Nausea or vomiting  Last Vitals:  Filed Vitals:   10/01/14 1220  BP: 118/72  Pulse: 90  Temp:   Resp: 18    Post-op Vital Signs: stable   Complications: No apparent anesthesia complications

## 2014-10-01 NOTE — H&P (View-Only) (Signed)
  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 Please draw a baseline cortisol level. Then administer 265mcg of Cosyntropin by IV. Then measure two more repeat serum cortisol levels at 30 and 60 minutes after the medication infusion.

## 2014-10-01 NOTE — Op Note (Signed)
[facility] [place], [address]   COLONOSCOPY PROCEDURE REPORT  PATIENT: [name]  MR#: [id number] BIRTHDATE: 07-13-1955 , 70  yrs. old GENDER: female ENDOSCOPIST: Arta Silence, MD REFERRED OE:[facility] [place] PROCEDURE DATE:  October 16, 2014 PROCEDURE:   Colonoscopy, diagnostic ASA CLASS:   Class III INDICATIONS:abdominal pain, no prior colonoscopy age > 37. MEDICATIONS: Monitored anesthesia care  DESCRIPTION OF PROCEDURE:   After the risks benefits and alternatives of the procedure were thoroughly explained, informed consent was obtained.  revealed no abnormalities of the rectum. The pediatric  colonoscope was introduced through the anus and advanced to the cecum, which was identified by both the appendix and ileocecal valve. No adverse events experienced.   The quality of the prep was good.  The instrument was then slowly withdrawn as the colon was fully examined. Estimated blood loss is zero unless otherwise noted in this procedure report.    Findings:  Normal digital rectal exam.  Prep quality adequate.  No polyps, masses, vascular ectasias, or inflammatory changes were seen.  Normal retroflexed view of rectum.           Withdrawal time was 9 minutes     .  The scope was withdrawn and the procedure completed.  COMPLICATIONS:  ENDOSCOPIC IMPRESSION:     Normal colonoscopy to cecum.  No explanation for patient's abdominal pain identified on endoscopy or colonoscopy.  RECOMMENDATIONS:     1.  Watch for potential complications of procedure. 2.  Repeat screening colonoscopy in 10 years. 3.  Follow-up with [facility] in 2-3 months for ongoing management of abdominal pain.  eSigned:  Arta Silence, MD 2014-10-16 12:00 PM   cc:  CPT CODES: ICD CODES:  The ICD and CPT codes recommended by this software are interpretations from the data that the clinical staff has captured with the software.  The verification of the  translation of this report to the ICD and CPT codes and modifiers is the sole responsibility of the health care institution and practicing physician where this report was generated.  [employer] will not be held responsible for the validity of the ICD and CPT codes included on this report.  AMA assumes no liability for data contained or not contained herein. CPT is a Designer, television/film set of the [employer].

## 2014-10-01 NOTE — Interval H&P Note (Signed)
History and Physical Interval Note:  10/01/2014 11:00 AM  [name]  has presented today for surgery, with the diagnosis of abd.pain/abnormal ct scan  The various methods of treatment have been discussed with the patient and family. After consideration of risks, benefits and other options for treatment, the patient has consented to  Procedure(s): ESOPHAGOGASTRODUODENOSCOPY (EGD) WITH PROPOFOL (N/A) COLONOSCOPY WITH PROPOFOL (N/A) as a surgical intervention .  The patient's history has been reviewed, patient examined, no change in status, stable for surgery.  I have reviewed the patient's chart and labs.  Questions were answered to the patient's satisfaction.     Bradan Congrove M  Assessment:  1.  Epigastric abdominal pain. 2.  Lower abdominal pain.  Plan:  1.  Endoscopy. 2.  Risks (bleeding, infection, bowel perforation that could require surgery, sedation-related changes in cardiopulmonary systems), benefits (identification and possible treatment of source of symptoms, exclusion of certain causes of symptoms), and alternatives (watchful waiting, radiographic imaging studies, empiric medical treatment) of upper endoscopy (EGD) were explained to patient/family in detail and patient wishes to proceed. 3.  Colonoscopy. 4.  Risks (bleeding, infection, bowel perforation that could require surgery, sedation-related changes in cardiopulmonary systems), benefits (identification and possible treatment of source of symptoms, exclusion of certain causes of symptoms), and alternatives (watchful waiting, radiographic imaging studies, empiric medical treatment) of colonoscopy were explained to patient/family in detail and patient wishes to proceed.

## 2014-10-02 ENCOUNTER — Encounter (HOSPITAL_COMMUNITY): Payer: Self-pay | Admitting: Gastroenterology

## 2014-10-07 ENCOUNTER — Telehealth: Payer: Self-pay | Admitting: Internal Medicine

## 2014-10-07 NOTE — Telephone Encounter (Signed)
Agree with this plan.  Anticipate BP would be elevated prior to taking medications.  If BP still elevated when she calls back, she can be seen this afternoon.

## 2014-10-07 NOTE — Telephone Encounter (Signed)
Called pt and blood Pressure was 143/81. She feels better and will call back if any other elevations. She does not want to be seen .

## 2014-10-07 NOTE — Telephone Encounter (Signed)
Pt requesting the nurse to call back regarding bp.

## 2014-10-07 NOTE — Telephone Encounter (Signed)
Returned call to pt.  Pt states she got up today and BP was 195/95 her blood sugar was 70    She went back to bed and got up an hour ago with  BP 180/100 She is now eating cereal which will help the blood sugar.  She took her BP meds about 2 hours ago.  Normal BP reading is about 1600/80   She feels fine but worried with readings. Denies dizziness, vision problems, or any weakness. I told pt I would call her back in 1 hour and recheck BP.  If still elevated we can see her in clinic this afternoon.  Please advise

## 2014-10-08 ENCOUNTER — Encounter (HOSPITAL_COMMUNITY): Payer: Self-pay | Admitting: Emergency Medicine

## 2014-10-08 ENCOUNTER — Emergency Department (HOSPITAL_COMMUNITY)
Admission: EM | Admit: 2014-10-08 | Discharge: 2014-10-08 | Disposition: A | Discharge status: Home / Self Care | Payer: No Typology Code available for payment source | Attending: Emergency Medicine | Admitting: Emergency Medicine

## 2014-10-08 DIAGNOSIS — G8929 Other chronic pain: Secondary | ICD-10-CM | POA: Insufficient documentation

## 2014-10-08 DIAGNOSIS — Q249 Congenital malformation of heart, unspecified: Secondary | ICD-10-CM | POA: Insufficient documentation

## 2014-10-08 DIAGNOSIS — I10 Essential (primary) hypertension: Secondary | ICD-10-CM

## 2014-10-08 DIAGNOSIS — Z8739 Personal history of other diseases of the musculoskeletal system and connective tissue: Secondary | ICD-10-CM | POA: Insufficient documentation

## 2014-10-08 DIAGNOSIS — N390 Urinary tract infection, site not specified: Secondary | ICD-10-CM | POA: Insufficient documentation

## 2014-10-08 DIAGNOSIS — Z862 Personal history of diseases of the blood and blood-forming organs and certain disorders involving the immune mechanism: Secondary | ICD-10-CM | POA: Insufficient documentation

## 2014-10-08 DIAGNOSIS — I129 Hypertensive chronic kidney disease with stage 1 through stage 4 chronic kidney disease, or unspecified chronic kidney disease: Secondary | ICD-10-CM | POA: Insufficient documentation

## 2014-10-08 DIAGNOSIS — Z8719 Personal history of other diseases of the digestive system: Secondary | ICD-10-CM | POA: Insufficient documentation

## 2014-10-08 DIAGNOSIS — E785 Hyperlipidemia, unspecified: Secondary | ICD-10-CM | POA: Insufficient documentation

## 2014-10-08 DIAGNOSIS — N183 Chronic kidney disease, stage 3 (moderate): Secondary | ICD-10-CM | POA: Insufficient documentation

## 2014-10-08 DIAGNOSIS — E119 Type 2 diabetes mellitus without complications: Secondary | ICD-10-CM | POA: Insufficient documentation

## 2014-10-08 DIAGNOSIS — Z9889 Other specified postprocedural states: Secondary | ICD-10-CM | POA: Insufficient documentation

## 2014-10-08 DIAGNOSIS — Z8659 Personal history of other mental and behavioral disorders: Secondary | ICD-10-CM | POA: Insufficient documentation

## 2014-10-08 DIAGNOSIS — Z8742 Personal history of other diseases of the female genital tract: Secondary | ICD-10-CM | POA: Insufficient documentation

## 2014-10-08 DIAGNOSIS — Z794 Long term (current) use of insulin: Secondary | ICD-10-CM | POA: Insufficient documentation

## 2014-10-08 DIAGNOSIS — Q6102 Congenital multiple renal cysts: Secondary | ICD-10-CM | POA: Insufficient documentation

## 2014-10-08 LAB — CBC
HCT: 36.3 % (ref 36.0–46.0)
Hemoglobin: 12 g/dL (ref 12.0–15.0)
MCH: 29.6 pg (ref 26.0–34.0)
MCHC: 33.1 g/dL (ref 30.0–36.0)
MCV: 89.6 fL (ref 78.0–100.0)
PLATELETS: 227 10*3/uL (ref 150–400)
RBC: 4.05 MIL/uL (ref 3.87–5.11)
RDW: 12.8 % (ref 11.5–15.5)
WBC: 6.1 10*3/uL (ref 4.0–10.5)

## 2014-10-08 LAB — BASIC METABOLIC PANEL
ANION GAP: 12 (ref 5–15)
BUN: 26 mg/dL — ABNORMAL HIGH (ref 6–20)
CALCIUM: 9.2 mg/dL (ref 8.9–10.3)
CO2: 24 mmol/L (ref 22–32)
Chloride: 96 mmol/L — ABNORMAL LOW (ref 101–111)
Creatinine, Ser: 1.37 mg/dL — ABNORMAL HIGH (ref 0.44–1.00)
GFR, EST AFRICAN AMERICAN: 49 mL/min — AB (ref >60)
GFR, EST NON AFRICAN AMERICAN: 42 mL/min — AB (ref >60)
Glucose, Bld: 122 mg/dL — ABNORMAL HIGH (ref 65–99)
Potassium: 4.1 mmol/L (ref 3.5–5.1)
Sodium: 132 mmol/L — ABNORMAL LOW (ref 135–145)

## 2014-10-08 LAB — URINALYSIS, ROUTINE W REFLEX MICROSCOPIC
BILIRUBIN URINE: NEGATIVE
Glucose, UA: NEGATIVE mg/dL
Hgb urine dipstick: NEGATIVE
KETONES UR: NEGATIVE mg/dL
NITRITE: NEGATIVE
PH: 6.5 (ref 5.0–8.0)
Protein, ur: NEGATIVE mg/dL
Specific Gravity, Urine: 1.005 (ref 1.005–1.030)
UROBILINOGEN UA: 0.2 mg/dL (ref 0.0–1.0)

## 2014-10-08 LAB — URINE MICROSCOPIC-ADD ON

## 2014-10-08 MED ORDER — NITROFURANTOIN MONOHYD MACRO 100 MG PO CAPS: 100.0000 mg | ORAL_CAPSULE | Freq: Two times a day (BID) | ORAL | Status: DC

## 2014-10-08 NOTE — Discharge Instructions (Signed)

## 2014-10-08 NOTE — ED Notes (Signed)
Pt states that she has been taking her blood pressure at home for the past two days, and it has been elevated. Pt also wants to be seen for a pain that goes across her stomach.

## 2014-10-08 NOTE — Progress Notes (Signed)
[place] [facility] [phone]  Patient is a current orange card holder and established as a patient at [facility]. Patient has been offered  case management services several times in prior visits to the ED through her orange card. Patient has been unwilling for a p4cc case manager to assist with resources. Patient states she will follow up with her pcp. Patient educated on proper uses of her [place] orange card. Patient agitated at this time. My contact information provided for any future questions or concerns. No other [place] Specialist needs identified at this time.

## 2014-10-08 NOTE — ED Provider Notes (Signed)
CSN: [id number]     Arrival date & time 10/08/14  0616 History   First MD Initiated Contact with Patient 10/08/14 [phone]     Chief Complaint  Patient presents with  . Hypertension     (Consider location/radiation/quality/duration/timing/severity/associated sxs/prior Treatment) HPI Comments: Here due to high BP. BP when she woke up 185/105. Took her medicine, now BP improved to the 130s.  Patient also is complaining of abdominal pain. Hx of chronic abdominal pain, has been on carafate without relief. She had an EGD and a colonoscopy that did not show any explanation for her abdominal pain. She feels like she might have a UTI.  Patient is a 59 y.o. female presenting with hypertension. The history is provided by the patient.  Hypertension This is a chronic problem. The current episode started more than 1 week ago. The problem occurs constantly. The problem has not changed since onset.Pertinent negatives include no chest pain, no abdominal pain and no shortness of breath. Nothing aggravates the symptoms. The symptoms are relieved by medications (taking her BP meds). She has tried nothing for the symptoms.    Past Medical History  Diagnosis Date  . Diabetes mellitus type II, uncontrolled   . Hypertension   . Hyperlipidemia   . Ovarian cyst, left   . Anemia     due to menorrhagia, BL 8-10  . Vaginal cyst     nabothian and bartholin  . Anxiety   . Depression   . Postmenopausal bleeding 06/12/2008  . 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 UTI (lower urinary tract infection)   . IBS (irritable bowel syndrome)   . Chronic back pain   . Chronic abdominal pain   . DDD (degenerative disc disease), lumbar   . Diabetes mellitus without complication   . History of palpitations     evaluated recently 12'15  . Colitis   . CKD (chronic kidney disease) stage 3, GFR 30-59 ml/min     baseline creatinine 1.4-1.7  . Bilateral renal cysts 01/15/2009    Qualifier: Diagnosis of   By: Tyrell Antonio MD, Jerald Kief     Past Surgical History  Procedure Laterality Date  . Cardiac surgery      to repair congenital defect as a child- 49months old  . Esophagogastroduodenoscopy (egd) with propofol N/A 10/01/2014    Procedure: ESOPHAGOGASTRODUODENOSCOPY (EGD) WITH PROPOFOL;  Surgeon: Arta Silence, MD;  Location: [facility];  Service: Endoscopy;  Laterality: N/A;  . Colonoscopy with propofol N/A 10/01/2014    Procedure: COLONOSCOPY WITH PROPOFOL;  Surgeon: Arta Silence, MD;  Location: [facility];  Service: Endoscopy;  Laterality: N/A;   Family History  Problem Relation Age of Onset  . Stroke Father   . Heart attack Father     Had MI in his 68s  . Stomach cancer Paternal Grandmother   . Diabetes Maternal Grandmother   . Cerebral palsy Daughter   . Anesthesia problems Neg Hx   . Hypotension Neg Hx   . Malignant hyperthermia Neg Hx   . Pseudochol deficiency Neg Hx    Social History  Substance Use Topics  . Smoking status: Never Smoker   . Smokeless tobacco: Never Used  . Alcohol Use: No   OB History    Gravida Para Term Preterm AB TAB SAB Ectopic Multiple Living   3 1 1  2  2   1      Review of Systems  Constitutional: Negative for fever.  Respiratory: Negative for cough  and shortness of breath.   Cardiovascular: Negative for chest pain.  Gastrointestinal: Negative for abdominal pain.  All other systems reviewed and are negative.     Allergies  Review of patient's allergies indicates no known allergies.  Home Medications   Prior to Admission medications   Medication Sig Start Date End Date Taking? Authorizing Provider  acetaminophen (TYLENOL) 650 MG CR tablet Take 650 mg by mouth every 8 (eight) hours as needed for pain.   Yes Historical Provider, MD  enalapril (VASOTEC) 10 MG tablet Take 1 tablet (10 mg total) by mouth daily. 06/08/14  Yes Carly Montey Hora, MD  Insulin Glargine (LANTUS SOLOSTAR) 100 UNIT/ML Solostar Pen Inject 22 Units into the skin daily at 10  pm. 06/10/14  Yes Carly J Rivet, MD  pioglitazone (ACTOS) 15 MG tablet Take 2 tablets (30 mg total) by mouth daily. Patient taking differently: Take 15 mg by mouth daily.  08/26/14  Yes Carly J Rivet, MD  simvastatin (ZOCOR) 40 MG tablet TAKE 1 TABLET (40 MG TOTAL) BY MOUTH AT BEDTIME. Patient taking differently: Take 40 mg by mouth at bedtime.  11/26/13  Yes [name], MD  sucralfate (CARAFATE) 1 G tablet Take 1 g by mouth 4 (four) times daily as needed (ulcers).   Yes Historical Provider, MD  dicyclomine (BENTYL) 20 MG tablet Take 1 tablet (20 mg total) by mouth every 6 (six) hours as needed for spasms (for abdominal cramping). Patient not taking: Reported on 10/08/2014 09/09/14   Linton Flemings, MD   BP 133/62 mmHg  Pulse 82  Temp(Src) 97.9 F (36.6 C) (Oral)  Resp 17  Ht 5\' 4"  (1.626 m)  Wt 164 lb (74.39 kg)  BMI 28.14 kg/m2  SpO2 98% Physical Exam  Constitutional: She is oriented to person, place, and time. She appears well-developed and well-nourished. No distress.  HENT:  Head: Normocephalic and atraumatic.  Mouth/Throat: Oropharynx is clear and moist.  Eyes: EOM are normal. Pupils are equal, round, and reactive to light.  Neck: Normal range of motion. Neck supple.  Cardiovascular: Normal rate and regular rhythm.  Exam reveals no friction rub.   No murmur heard. Pulmonary/Chest: Effort normal and breath sounds normal. No respiratory distress. She has no wheezes. She has no rales.  Abdominal: Soft. She exhibits no distension. There is tenderness (mild, suprapubic). There is no rebound.  Musculoskeletal: Normal range of motion. She exhibits no edema.  Neurological: She is alert and oriented to person, place, and time.  Skin: She is not diaphoretic.  Nursing note and vitals reviewed.   ED Course  Procedures (including critical care time) Labs Review Labs Reviewed  BASIC METABOLIC PANEL - Abnormal; Notable for the following:    Sodium 132 (*)    Chloride 96 (*)     Glucose, Bld 122 (*)    BUN 26 (*)    Creatinine, Ser 1.37 (*)    GFR calc non Af Amer 42 (*)    GFR calc Af Amer 49 (*)    All other components within normal limits  URINALYSIS, ROUTINE W REFLEX MICROSCOPIC (NOT AT [facility]) - Abnormal; Notable for the following:    Leukocytes, UA TRACE (*)    All other components within normal limits  CBC  URINE MICROSCOPIC-ADD ON    Imaging Review No results found. I have personally reviewed and evaluated these images and lab results as part of my medical decision-making.   EKG Interpretation None      MDM   Final diagnoses:  UTI (lower urinary tract infection)  Essential hypertension    32F here with multiple complaints - HTN, abdominal pain, possible UTI. HTN: BPs ok here. BP improved at home after her meds. Counseled no interventions required at this time. Abdominal pain: Recent EGD and colonoscopy by [facility]. Mild suprapubic tenderness here. No fever, N/V/D. She's well appearing. No peritoneal signs. I do not feel she warrants a CT at this time. Possible UTI: 3-6 wbcs on UA. Will send a culture and put on macrobid. Stable for discharge.    Evelina Bucy, MD 10/08/14 [phone]

## 2014-10-08 NOTE — ED Notes (Signed)
MD at bedside. 

## 2014-10-09 LAB — URINE CULTURE: Culture: NO GROWTH

## 2014-10-14 ENCOUNTER — Other Ambulatory Visit: Payer: Self-pay | Admitting: Internal Medicine

## 2014-10-14 ENCOUNTER — Encounter: Payer: No Typology Code available for payment source | Admitting: Internal Medicine

## 2014-10-14 DIAGNOSIS — N281 Cyst of kidney, acquired: Secondary | ICD-10-CM

## 2014-10-15 ENCOUNTER — Ambulatory Visit (INDEPENDENT_AMBULATORY_CARE_PROVIDER_SITE_OTHER): Payer: No Typology Code available for payment source | Admitting: Internal Medicine

## 2014-10-15 ENCOUNTER — Encounter: Payer: Self-pay | Admitting: Internal Medicine

## 2014-10-15 VITALS — BP 136/77 | HR 92 | Temp 98.1°F | Ht 65.0 in | Wt 163.4 lb

## 2014-10-15 DIAGNOSIS — M25552 Pain in left hip: Secondary | ICD-10-CM

## 2014-10-15 LAB — GLUCOSE, CAPILLARY: GLUCOSE-CAPILLARY: 209 mg/dL — AB (ref 65–99)

## 2014-10-15 MED ORDER — TRAMADOL HCL 50 MG PO TABS: 50.0000 mg | ORAL_TABLET | Freq: Four times a day (QID) | ORAL | Status: DC | PRN

## 2014-10-15 NOTE — Assessment & Plan Note (Signed)
Left hip pain started 1 week ago and has slowly progressed until last night when pt states it just hit her out of no where. Pain is 8/10 in severity, located at left buttock, is non radiating, and feels like a muscle ache. She had difficulty sleeping due to hip pain last night and took 2 tylenol arthritis tabs at midnight and then again at 7:00am this morning. She has not tried anything else for pain and ibuprofen is contraindicated for her due to renal failure. Denies falls, trauma, sharp shooting pain, difficulty ambulating, and numbness around buttock, thigh, and groin. Seating makes pain better and movement makes pain worse. She has never had pain like this before, in the past she had a lumbar xray in 2014 for back pain that was negative except for DDD at L1-L2. Likely pain is MSK in origin as she can point to location of pain, pain is non radiating, and it is not associated with numbness or tingling of extremities. DDx include trochanteric bursitis however she was able to stand on one leg on the affected side for almost 30 sec and stopped due to knee pain, meralgia paresthetic however she denies numbness or paresthesias at affected site, sciatica howerver non radiating, PMR as she has hx of shoulder pain however able to stand from seated position w/o difficulty and does not complain of muscle weakness, and also claudication as pain is worse with movement.   - rx for tramadol 50mg  q6h prn for pain as this is on the [facility] list and pt has financial difficulties that limit rx for voltaren gel and renal disease the limits the use of NSAIDS - f/u in 1 week if pain has not improved, can trial a muscle relaxant such as zanaflex. She has tried flexeril in the past which made her vomit however she said she was also constipated at that time.

## 2014-10-15 NOTE — Progress Notes (Signed)
Internal Medicine Clinic Attending  Case discussed with Dr. Truong at the time of the visit.  We reviewed the resident's history and exam and pertinent patient test results.  I agree with the assessment, diagnosis, and plan of care documented in the resident's note.  

## 2014-10-15 NOTE — Progress Notes (Signed)
   Subjective:    Patient ID: [name], female    DOB: 07-18-1955, 59 y.o.   MRN: [id number]  HPI 59 y/o F w/ PMHx of HTN, GERD, DM, CKD stage 3, and HLD who presents to clinic for left hip pain. Please see problem list for further details.      Review of Systems  Constitutional: Negative for fever and activity change.  Respiratory: Negative for shortness of breath.   Cardiovascular: Negative for chest pain.  Gastrointestinal: Negative for nausea, vomiting and abdominal pain.  Neurological: Positive for dizziness and numbness (peripheral neuropathy). Negative for weakness and headaches.  Psychiatric/Behavioral: Positive for sleep disturbance (due to hip pain).       Objective:   Physical Exam  Constitutional: She appears well-developed and well-nourished.  Cardiovascular: Normal rate and regular rhythm.   Murmur (3/6 murmur radiating to carotids) heard. Pulmonary/Chest: Effort normal and breath sounds normal. No respiratory distress. She has no wheezes. She has no rales.  Abdominal: Soft. Bowel sounds are normal.  Musculoskeletal: She exhibits no edema.  Nl range of motion of rt and left hip, neg straight leg test b/l, able to stand on left leg for around 25 secs but stopped 2/2 left knee pain, able to stand from seated position without difficulty  Neurological: She is alert.  Skin: Skin is warm and dry.  Psychiatric:  Fast speech, stressed           Assessment & Plan:  Please see problem based assessment and plan.

## 2014-10-15 NOTE — Progress Notes (Signed)
Tramadol rx called to [facility]; pt informed.

## 2014-10-15 NOTE — Patient Instructions (Signed)
You can take tramadol 50mg  every 6 hours as need for pain. You can also try warm compresses and refrain from strenuous physical activity. If your pain does not improve in 1 week call the clinic for an appointment. It was nice meeting you!

## 2014-10-16 ENCOUNTER — Emergency Department (INDEPENDENT_AMBULATORY_CARE_PROVIDER_SITE_OTHER): Payer: No Typology Code available for payment source

## 2014-10-16 ENCOUNTER — Emergency Department (INDEPENDENT_AMBULATORY_CARE_PROVIDER_SITE_OTHER)
Admission: EM | Admit: 2014-10-16 | Discharge: 2014-10-16 | Disposition: A | Discharge status: Home / Self Care | Payer: No Typology Code available for payment source | Source: Home / Self Care | Attending: Family Medicine | Admitting: Family Medicine

## 2014-10-16 ENCOUNTER — Encounter (HOSPITAL_COMMUNITY): Payer: Self-pay | Admitting: Emergency Medicine

## 2014-10-16 DIAGNOSIS — M533 Sacrococcygeal disorders, not elsewhere classified: Secondary | ICD-10-CM

## 2014-10-16 DIAGNOSIS — M25552 Pain in left hip: Secondary | ICD-10-CM

## 2014-10-16 DIAGNOSIS — M25559 Pain in unspecified hip: Secondary | ICD-10-CM

## 2014-10-16 MED ORDER — BUPIVACAINE HCL (PF) 0.5 % IJ SOLN
INTRAMUSCULAR | Status: AC
  Filled 2014-10-16: qty 10

## 2014-10-16 MED ORDER — TRIAMCINOLONE ACETONIDE 40 MG/ML IJ SUSP
INTRAMUSCULAR | Status: AC
  Filled 2014-10-16: qty 1

## 2014-10-16 NOTE — Discharge Instructions (Signed)
Hip Pain Your hip is the joint between your upper legs and your lower pelvis. The bones, cartilage, tendons, and muscles of your hip joint perform a lot of work each day supporting your body weight and allowing you to move around. Hip pain can range from a minor ache to severe pain in one or both of your hips. Pain may be felt on the inside of the hip joint near the groin, or the outside near the buttocks and upper thigh. You may have swelling or stiffness as well.  HOME CARE INSTRUCTIONS   Take medicines only as directed by your health care provider.  Apply ice to the injured area:  Put ice in a plastic bag.  Place a towel between your skin and the bag.  Leave the ice on for 15-20 minutes at a time, 3-4 times a day.  Keep your leg raised (elevated) when possible to lessen swelling.  Avoid activities that cause pain.  Follow specific exercises as directed by your health care provider.  Sleep with a pillow between your legs on your most comfortable side.  Record how often you have hip pain, the location of the pain, and what it feels like. SEEK MEDICAL CARE IF:   You are unable to put weight on your leg.  Your hip is red or swollen or very tender to touch.  Your pain or swelling continues or worsens after 1 week.  You have increasing difficulty walking.  You have a fever. SEEK IMMEDIATE MEDICAL CARE IF:   You have fallen.  You have a sudden increase in pain and swelling in your hip. MAKE SURE YOU:   Understand these instructions.  Will watch your condition.  Will get help right away if you are not doing well or get worse. Document Released: 07/14/2009 Document Revised: 06/10/2013 Document Reviewed: 09/20/2012 [facility] Patient Information 2015 [place], [place]. This information is not intended to replace advice given to you by your health care provider. Make sure you discuss any questions you have with your health care provider.  Sacroiliac Joint Dysfunction The  sacroiliac joint connects the lower part of the spine (the sacrum) with the bones of the pelvis. CAUSES  Sometimes, there is no obvious reason for sacroiliac joint dysfunction. Other times, it may occur   During pregnancy.  After injury, such as:  Car accidents.  Sport-related injuries.  Work-related injuries.  Due to one leg being shorter than the other.  Due to other conditions that affect the joints, such as:  Rheumatoid arthritis.  Gout.  Psoriasis.  Joint infection (septic arthritis). SYMPTOMS  Symptoms may include:  Pain in the:  Lower back.  Buttocks.  Groin.  Thighs and legs.  Difficult sitting, standing, walking, lying, bending or lifting. DIAGNOSIS  A number of tests may be used to help diagnose the cause of sacroiliac joint dysfunction, including:  Imaging tests to look for other causes of pain, including:  MRI.  CT scan.  Bone scan.  Diagnostic injection: During a special x-ray (called fluoroscopy), a needle is put into the sacroiliac joint. A numbing medicine is injected into the joint. If the pain is improved or stopped, the diagnosis of sacroiliac joint dysfunction is more likely. TREATMENT  There are a number of types of treatment used for sacroiliac joint dysfunction, including:  Only take over-the-counter or prescription medicines for pain, discomfort, or fever as directed by your caregiver.  Medications to relax muscles.  Rest. Decreasing activity can help cut down on painful muscle spasms and allow  the back to heal.  Application of heat or ice to the lower back may improve muscle spasms and soothe pain.  Brace. A special back brace, called a sacroiliac belt, can help support the joint while your back is healing.  Physical therapy can help teach comfortable positions and exercises to strengthen muscles that support the sacroiliac joint.  Cortisone injections. Injections of steroid medicine into the joint can help decrease swelling  and improve pain.  Hyaluronic acid injections. This chemical improves lubrication within the sacroiliac joint, thereby decreasing pain.  Radiofrequency ablation. A special needle is placed into the joint, where it burns away nerves that are carrying pain messages from the joint.  Surgery. Because pain occurs during movement of the joint, screws and plates may be installed in order to limit or prevent joint motion. HOME CARE INSTRUCTIONS   Take all medications exactly as directed.  Follow instructions regarding both rest and physical activity, to avoid worsening the pain.  Do physical therapy exercises exactly as prescribed. SEEK IMMEDIATE MEDICAL CARE IF:  You experience increasingly severe pain.  You develop new symptoms, such as numbness or tingling in your legs or feet.  You lose bladder or bowel control. Document Released: 04/22/2008 Document Revised: 04/18/2011 Document Reviewed: 04/22/2008 [facility] Patient Information 2015 [place], [place]. This information is not intended to replace advice given to you by your health care provider. Make sure you discuss any questions you have with your health care provider.

## 2014-10-16 NOTE — ED Notes (Signed)
Pt c/o back/hip pain onset 3 days; reports she was seen at [facility] Internal medicine yest Pain is constant. Took tramadol w/some relief.  Denies inj/trauma Alert... No acute disterss

## 2014-10-16 NOTE — ED Provider Notes (Signed)
CSN: [id number]     Arrival date & time 10/16/14  1510 History   First MD Initiated Contact with Patient 10/16/14 1632     Chief Complaint  Patient presents with  . Hip Pain   (Consider location/radiation/quality/duration/timing/severity/associated sxs/prior Treatment) HPI Comments: 59 year old female complaining of left hip pain for 4 days. She states just prior to that there were "twinges" for few days however sudden severe pain occurred 4 days ago. Denies any known injury or trauma. She does not work and denies any known movement or incident that would have initiated the pain. She is unable to tell me whether it is constant or whether it comes and goes. She describes it as achy. She states that it is helped partially by Tylenol, partially by tramadol and when sitting. It is worse with standing and ambulation. She saw her PCP yesterday for the same complaint in the examination described was the same as obtained today. She is frustrated with her PCP because she did not get an x-ray and was not advised of a definitive diagnosis. Patient is a 59 y.o. female presenting with hip pain. The history is provided by the patient.  Hip Pain This is a new problem. The current episode started more than 2 days ago. The problem has been gradually worsening. Pertinent negatives include no chest pain, no abdominal pain, no headaches and no shortness of breath. The symptoms are aggravated by standing and walking. The symptoms are relieved by lying down, acetaminophen and position. She has tried acetaminophen for the symptoms. The treatment provided moderate relief.    Past Medical History  Diagnosis Date  . Diabetes mellitus type II, uncontrolled   . Hypertension   . Hyperlipidemia   . Ovarian cyst, left   . Anemia     due to menorrhagia, BL 8-10  . Vaginal cyst     nabothian and bartholin  . Anxiety   . Depression   . Postmenopausal bleeding 06/12/2008  . Congenital heart defect     surgically corrected as  a child  . UTI (lower urinary tract infection)   . IBS (irritable bowel syndrome)   . Chronic back pain   . Chronic abdominal pain   . DDD (degenerative disc disease), lumbar   . Diabetes mellitus without complication   . History of palpitations     evaluated recently 12'15  . Colitis   . CKD (chronic kidney disease) stage 3, GFR 30-59 ml/min     baseline creatinine 1.4-1.7  . Bilateral renal cysts 01/15/2009    Qualifier: Diagnosis of  By: Tyrell Antonio MD, Jerald Kief     Past Surgical History  Procedure Laterality Date  . Cardiac surgery      to repair congenital defect as a child- 57months old  . Esophagogastroduodenoscopy (egd) with propofol N/A 10/01/2014    Procedure: ESOPHAGOGASTRODUODENOSCOPY (EGD) WITH PROPOFOL;  Surgeon: Arta Silence, MD;  Location: [facility];  Service: Endoscopy;  Laterality: N/A;  . Colonoscopy with propofol N/A 10/01/2014    Procedure: COLONOSCOPY WITH PROPOFOL;  Surgeon: Arta Silence, MD;  Location: [facility];  Service: Endoscopy;  Laterality: N/A;   Family History  Problem Relation Age of Onset  . Stroke Father   . Heart attack Father     Had MI in his 71s  . Stomach cancer Paternal Grandmother   . Diabetes Maternal Grandmother   . Cerebral palsy Daughter   . Anesthesia problems Neg Hx   . Hypotension Neg Hx   . Malignant hyperthermia Neg Hx   .  Pseudochol deficiency Neg Hx    Social History  Substance Use Topics  . Smoking status: Never Smoker   . Smokeless tobacco: Never Used  . Alcohol Use: No   OB History    Gravida Para Term Preterm AB TAB SAB Ectopic Multiple Living   3 1 1  2  2   1      Review of Systems  Constitutional: Negative.   Respiratory: Negative for cough and shortness of breath.   Cardiovascular: Negative for chest pain and leg swelling.  Gastrointestinal: Negative.  Negative for abdominal pain.  Genitourinary: Negative.   Musculoskeletal: Positive for arthralgias. Negative for back pain, joint swelling and neck pain.   Skin: Negative.   Neurological: Negative for tremors, speech difficulty, weakness, numbness and headaches.  All other systems reviewed and are negative.   Allergies  Review of patient's allergies indicates no known allergies.  Home Medications   Prior to Admission medications   Medication Sig Start Date End Date Taking? Authorizing Provider  acetaminophen (TYLENOL) 650 MG CR tablet Take 650 mg by mouth every 8 (eight) hours as needed for pain.   Yes Historical Provider, MD  enalapril (VASOTEC) 10 MG tablet Take 1 tablet (10 mg total) by mouth daily. 06/08/14  Yes Carly Montey Hora, MD  Insulin Glargine (LANTUS SOLOSTAR) 100 UNIT/ML Solostar Pen Inject 22 Units into the skin daily at 10 pm. 06/10/14  Yes Carly J Rivet, MD  pioglitazone (ACTOS) 15 MG tablet Take 2 tablets (30 mg total) by mouth daily. Patient taking differently: Take 15 mg by mouth daily.  08/26/14  Yes Carly J Rivet, MD  simvastatin (ZOCOR) 40 MG tablet TAKE 1 TABLET (40 MG TOTAL) BY MOUTH AT BEDTIME. Patient taking differently: Take 40 mg by mouth at bedtime.  11/26/13  Yes [name], MD  traMADol (ULTRAM) 50 MG tablet Take 1 tablet (50 mg total) by mouth every 6 (six) hours as needed. 10/15/14 10/15/15 Yes Norman Herrlich, MD  dicyclomine (BENTYL) 20 MG tablet Take 1 tablet (20 mg total) by mouth every 6 (six) hours as needed for spasms (for abdominal cramping). Patient not taking: Reported on 10/08/2014 09/09/14   Linton Flemings, MD  nitrofurantoin, macrocrystal-monohydrate, (MACROBID) 100 MG capsule Take 1 capsule (100 mg total) by mouth 2 (two) times daily. X 3 days 10/08/14   Evelina Bucy, MD  sucralfate (CARAFATE) 1 G tablet Take 1 g by mouth 4 (four) times daily as needed (ulcers).    Historical Provider, MD   Meds Ordered and Administered this Visit  Medications - No data to display  BP 119/66 mmHg  Pulse 85  Temp(Src) 98.5 F (36.9 C) (Oral)  Resp 16  SpO2 97% No data found.   Physical Exam  Constitutional: She  appears well-developed and well-nourished. No distress.  Eyes: EOM are normal.  Neck: Normal range of motion. Neck supple.  Cardiovascular: Normal rate and regular rhythm.   Pulmonary/Chest: Effort normal. No respiratory distress.  Musculoskeletal: Normal range of motion. She exhibits tenderness. She exhibits no edema.  Tenderness to the left posterior hip along the mid buttock. Tenderness over the left SI joint. No tenderness to the lateral hip. Patient exhibits full range of motion of the hip. She is able to stand without pain to the hip. She is ambulatory. No back tenderness, no spinal tenderness.  Neurological: She is alert. No cranial nerve deficit. She exhibits normal muscle tone.  Skin: Skin is warm.  Psychiatric: Her mood appears anxious. Her affect is  blunt. Her speech is rapid and/or pressured and tangential. She is agitated. She expresses impulsivity.  Nursing note and vitals reviewed.   ED Course  Injection of joint Date/Time: 10/16/2014 5:55 PM Performed by: Marcha Dutton, Desirai Traxler Authorized by: Ihor Gully D Consent: Verbal consent obtained. Risks and benefits: risks, benefits and alternatives were discussed Consent given by: patient Patient understanding: patient states understanding of the procedure being performed Patient identity confirmed: verbally with patient Preparation: Patient was prepped and draped in the usual sterile fashion. Local anesthesia used: no Patient sedated: no Patient tolerance: Patient tolerated the procedure well with no immediate complications Comments: Left SI joint injections, x 2 . Partial volume one above the other along the line of the SI joint   (including critical care time)  Labs Review Labs Reviewed - No data to display  Imaging Review Dg Hip Unilat With Pelvis 2-3 Views Left  10/16/2014   CLINICAL DATA:  Three days of left posterior hip pain extending into the buttock, no known injury, history of diabetes ; images obtained in the standing  position  EXAM: DG HIP (WITH OR WITHOUT PELVIS) 2-3V LEFT standing AP pelvis  COMPARISON:  Abdominal and pelvic CT scan of August 02, 2014  FINDINGS: The patient is mildly rotated on this study. The bony pelvis is adequately mineralized. There is no acute or healing fracture. The sacrum and SI joints are grossly normal. The observed portions of the pubic rami appear normal. There is mild narrowing of the left hip joint space but this is less prominent than that on the right. AP and frog-leg lateral views of both hips reveal the articular surfaces to remain smoothly rounded. The femoral neck and intertrochanteric region are normal.  IMPRESSION: There is no acute bony abnormality of the pelvis. There is mild narrowing of the left hip joint.   Electronically Signed   By: Aleida Crandell  [place] M.D.   On: 10/16/2014 17:22     Visual Acuity Review  Right Eye Distance:   Left Eye Distance:   Bilateral Distance:    Right Eye Near:   Left Eye Near:    Bilateral Near:         MDM   1. Posterior pain of hip, left   2. Hip pain, acute   3. SI (sacroiliac) pain   Presumptive SI pain, that is where the tenderness is located Kenalog 40 mg and bupivacaine 4 cc over L SI joint at point of greatest tenderness Discussed insulin adjustments for any increase in BS. F/U with PCP as needed  Janne Napoleon, NP 10/16/14 1757

## 2014-10-16 NOTE — ED Notes (Signed)
Marcaine and Kenelog 40 given to provider for IM inj

## 2014-10-22 ENCOUNTER — Telehealth: Payer: Self-pay | Admitting: *Deleted

## 2014-10-22 NOTE — Telephone Encounter (Signed)
Pt called about her MR/MRA Abd appt - no answer, left message appt is 9/23 @ 1500PM here at [facility] @ 1445PM and NPO x 4hrs prior. Appt also mailed to pt.

## 2014-10-24 ENCOUNTER — Telehealth: Payer: Self-pay | Admitting: Internal Medicine

## 2014-10-24 NOTE — Telephone Encounter (Signed)
I received a telephone call from [place] at 6:30 pm indicating her CBG was 414, she reports she ate a cheeseburger and fries from [employer] earlier tonight,  Her DM regimen consists of actos and 22units of Lantus QHS.  She denies any HA, abdominal pain, polydipsia or polyuria. - I advised her to drink plenty of water and to increase her Lantus to 25 units.  She will call back if her sugars do not improve, becomes symptomatic, or hypoglycemic.  Lucious Groves, DO

## 2014-10-27 ENCOUNTER — Other Ambulatory Visit: Payer: Self-pay | Admitting: Internal Medicine

## 2014-10-27 ENCOUNTER — Emergency Department (INDEPENDENT_AMBULATORY_CARE_PROVIDER_SITE_OTHER)
Admission: EM | Admit: 2014-10-27 | Discharge: 2014-10-27 | Disposition: A | Discharge status: Home / Self Care | Payer: No Typology Code available for payment source | Source: Home / Self Care | Attending: Family Medicine | Admitting: Family Medicine

## 2014-10-27 ENCOUNTER — Encounter (HOSPITAL_COMMUNITY): Payer: Self-pay

## 2014-10-27 DIAGNOSIS — N390 Urinary tract infection, site not specified: Secondary | ICD-10-CM

## 2014-10-27 DIAGNOSIS — E119 Type 2 diabetes mellitus without complications: Secondary | ICD-10-CM

## 2014-10-27 LAB — POCT URINALYSIS DIP (DEVICE)
Bilirubin Urine: NEGATIVE
GLUCOSE, UA: 100 mg/dL — AB
KETONES UR: NEGATIVE mg/dL
Nitrite: NEGATIVE
PROTEIN: NEGATIVE mg/dL
SPECIFIC GRAVITY, URINE: 1.01 (ref 1.005–1.030)
UROBILINOGEN UA: 0.2 mg/dL (ref 0.0–1.0)
pH: 5 (ref 5.0–8.0)

## 2014-10-27 MED ORDER — CEPHALEXIN 500 MG PO CAPS: 500.0000 mg | ORAL_CAPSULE | Freq: Four times a day (QID) | ORAL | Status: DC

## 2014-10-27 NOTE — ED Provider Notes (Signed)
CSN: [id number]     Arrival date & time 10/27/14  1848 History   First MD Initiated Contact with Patient 10/27/14 2021     Chief Complaint  Patient presents with  . Dysuria   (Consider location/radiation/quality/duration/timing/severity/associated sxs/prior Treatment) HPI Comments: 59 year old female complaining of "bladder leakage and urinary frequency for the past 3-4 days. She denies dysuria or fever. She has a history of UTIs and states the symptoms are similar to previous ones.  Patient is a 59 y.o. female presenting with dysuria.  Dysuria Associated symptoms: no flank pain     Past Medical History  Diagnosis Date  . Diabetes mellitus type II, uncontrolled   . Hypertension   . Hyperlipidemia   . Ovarian cyst, left   . Anemia     due to menorrhagia, BL 8-10  . Vaginal cyst     nabothian and bartholin  . Anxiety   . Depression   . Postmenopausal bleeding 06/12/2008  . 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 UTI (lower urinary tract infection)   . IBS (irritable bowel syndrome)   . Chronic back pain   . Chronic abdominal pain   . DDD (degenerative disc disease), lumbar   . Diabetes mellitus without complication   . History of palpitations     evaluated recently 12'15  . Colitis   . CKD (chronic kidney disease) stage 3, GFR 30-59 ml/min     baseline creatinine 1.4-1.7  . Bilateral renal cysts 01/15/2009    Qualifier: Diagnosis of  By: Tyrell Antonio MD, Jerald Kief     Past Surgical History  Procedure Laterality Date  . Cardiac surgery      to repair congenital defect as a child- 21months old  . Esophagogastroduodenoscopy (egd) with propofol N/A 10/01/2014    Procedure: ESOPHAGOGASTRODUODENOSCOPY (EGD) WITH PROPOFOL;  Surgeon: Arta Silence, MD;  Location: [facility];  Service: Endoscopy;  Laterality: N/A;  . Colonoscopy with propofol N/A 10/01/2014    Procedure: COLONOSCOPY WITH PROPOFOL;  Surgeon: Arta Silence, MD;  Location: [facility];  Service:  Endoscopy;  Laterality: N/A;   Family History  Problem Relation Age of Onset  . Stroke Father   . Heart attack Father     Had MI in his 52s  . Stomach cancer Paternal Grandmother   . Diabetes Maternal Grandmother   . Cerebral palsy Daughter   . Anesthesia problems Neg Hx   . Hypotension Neg Hx   . Malignant hyperthermia Neg Hx   . Pseudochol deficiency Neg Hx    Social History  Substance Use Topics  . Smoking status: Never Smoker   . Smokeless tobacco: Never Used  . Alcohol Use: No   OB History    Gravida Para Term Preterm AB TAB SAB Ectopic Multiple Living   3 1 1  2  2   1      Review of Systems  Constitutional: Negative.   Respiratory: Negative.   Cardiovascular: Negative for leg swelling.  Gastrointestinal: Negative.   Genitourinary: Positive for urgency and frequency. Negative for dysuria, hematuria, flank pain, difficulty urinating and pelvic pain.  Musculoskeletal: Negative.   Neurological: Negative.     Allergies  Review of patient's allergies indicates no known allergies.  Home Medications   Prior to Admission medications   Medication Sig Start Date End Date Taking? Authorizing Provider  acetaminophen (TYLENOL) 650 MG CR tablet Take 650 mg by mouth every 8 (eight) hours as needed for pain.    Historical Provider,  MD  cephALEXin (KEFLEX) 500 MG capsule Take 1 capsule (500 mg total) by mouth 4 (four) times daily. 10/27/14   Janne Napoleon, NP  dicyclomine (BENTYL) 20 MG tablet Take 1 tablet (20 mg total) by mouth every 6 (six) hours as needed for spasms (for abdominal cramping). Patient not taking: Reported on 10/08/2014 09/09/14   Linton Flemings, MD  enalapril (VASOTEC) 10 MG tablet Take 1 tablet (10 mg total) by mouth daily. 06/08/14   Carly J Rivet, MD  enalapril (VASOTEC) 10 MG tablet TAKE 2 TABLETS (20 MG TOTAL) BY MOUTH DAILY. 10/27/14   Carly Montey Hora, MD  Insulin Glargine (LANTUS SOLOSTAR) 100 UNIT/ML Solostar Pen Inject 22 Units into the skin daily at 10 pm. 06/10/14    Juliet Rude, MD  nitrofurantoin, macrocrystal-monohydrate, (MACROBID) 100 MG capsule Take 1 capsule (100 mg total) by mouth 2 (two) times daily. X 3 days 10/08/14   Evelina Bucy, MD  pioglitazone (ACTOS) 15 MG tablet Take 2 tablets (30 mg total) by mouth daily. Patient taking differently: Take 15 mg by mouth daily.  08/26/14   Juliet Rude, MD  simvastatin (ZOCOR) 40 MG tablet TAKE 1 TABLET (40 MG TOTAL) BY MOUTH AT BEDTIME. Patient taking differently: Take 40 mg by mouth at bedtime.  11/26/13   Ejiroghene Arlyce Dice, MD  sucralfate (CARAFATE) 1 G tablet Take 1 g by mouth 4 (four) times daily as needed (ulcers).    Historical Provider, MD  traMADol (ULTRAM) 50 MG tablet Take 1 tablet (50 mg total) by mouth every 6 (six) hours as needed. 10/15/14 10/15/15  Norman Herrlich, MD   Meds Ordered and Administered this Visit  Medications - No data to display  BP 152/76 mmHg  Pulse 95  Temp(Src) 98 F (36.7 C) (Oral)  SpO2 99% No data found.   Physical Exam  Constitutional: She appears well-nourished. No distress.  Neck: Normal range of motion. Neck supple.  Pulmonary/Chest: Effort normal. No respiratory distress.  Musculoskeletal: Normal range of motion. She exhibits no edema.  Neurological: She is alert.  Skin: Skin is warm and dry.  Nursing note and vitals reviewed.   ED Course  Procedures (including critical care time)  Labs Review Labs Reviewed  POCT URINALYSIS DIP (DEVICE) - Abnormal; Notable for the following:    Glucose, UA 100 (*)    Hgb urine dipstick TRACE (*)    Leukocytes, UA LARGE (*)    All other components within normal limits  URINE CULTURE    Imaging Review No results found.   Visual Acuity Review  Right Eye Distance:   Left Eye Distance:   Bilateral Distance:    Right Eye Near:   Left Eye Near:    Bilateral Near:         MDM   1. UTI (lower urinary tract infection)   2. Type 2 diabetes mellitus without complication    Keflex as dir Lots of  water Keep bld sugar good control Urine cult pending    Janne Napoleon, NP 10/27/14 2029

## 2014-10-27 NOTE — Discharge Instructions (Signed)

## 2014-10-27 NOTE — ED Notes (Signed)
i think i have another UTI

## 2014-10-28 LAB — URINE CULTURE: CULTURE: NO GROWTH

## 2014-10-30 NOTE — ED Notes (Signed)
Final report of UA testing negative for infection

## 2014-10-31 ENCOUNTER — Ambulatory Visit (HOSPITAL_COMMUNITY)
Admission: RE | Admit: 2014-10-31 | Discharge: 2014-10-31 | Disposition: A | Discharge status: Home / Self Care | Payer: No Typology Code available for payment source | Source: Ambulatory Visit | Attending: Internal Medicine | Admitting: Internal Medicine

## 2014-10-31 DIAGNOSIS — Q6102 Congenital multiple renal cysts: Secondary | ICD-10-CM | POA: Insufficient documentation

## 2014-10-31 DIAGNOSIS — N281 Cyst of kidney, acquired: Secondary | ICD-10-CM

## 2014-10-31 MED ORDER — GADOBENATE DIMEGLUMINE 529 MG/ML IV SOLN
15.0000 mL | Freq: Once | INTRAVENOUS | Status: AC | PRN
  Administered 2014-10-31: 15 mL via INTRAVENOUS

## 2014-11-04 ENCOUNTER — Encounter: Payer: No Typology Code available for payment source | Admitting: Internal Medicine

## 2014-11-11 ENCOUNTER — Ambulatory Visit (INDEPENDENT_AMBULATORY_CARE_PROVIDER_SITE_OTHER): Payer: No Typology Code available for payment source | Admitting: Internal Medicine

## 2014-11-11 ENCOUNTER — Telehealth: Payer: Self-pay | Admitting: Internal Medicine

## 2014-11-11 ENCOUNTER — Encounter: Payer: Self-pay | Admitting: Internal Medicine

## 2014-11-11 VITALS — BP 144/77 | HR 88 | Temp 98.4°F | Wt 163.2 lb

## 2014-11-11 DIAGNOSIS — M25552 Pain in left hip: Secondary | ICD-10-CM

## 2014-11-11 DIAGNOSIS — N183 Chronic kidney disease, stage 3 (moderate): Secondary | ICD-10-CM

## 2014-11-11 DIAGNOSIS — E1122 Type 2 diabetes mellitus with diabetic chronic kidney disease: Secondary | ICD-10-CM

## 2014-11-11 DIAGNOSIS — I10 Essential (primary) hypertension: Secondary | ICD-10-CM

## 2014-11-11 DIAGNOSIS — R109 Unspecified abdominal pain: Secondary | ICD-10-CM

## 2014-11-11 DIAGNOSIS — Z0000 Encounter for general adult medical examination without abnormal findings: Secondary | ICD-10-CM

## 2014-11-11 DIAGNOSIS — G8929 Other chronic pain: Secondary | ICD-10-CM

## 2014-11-11 DIAGNOSIS — Z23 Encounter for immunization: Secondary | ICD-10-CM

## 2014-11-11 LAB — GLUCOSE, CAPILLARY: GLUCOSE-CAPILLARY: 251 mg/dL — AB (ref 65–99)

## 2014-11-11 LAB — POCT GLYCOSYLATED HEMOGLOBIN (HGB A1C): HEMOGLOBIN A1C: 8.4

## 2014-11-11 MED ORDER — PIOGLITAZONE HCL 15 MG PO TABS: 30.0000 mg | ORAL_TABLET | Freq: Every day | ORAL | Status: DC

## 2014-11-11 NOTE — Telephone Encounter (Signed)
  INTERNAL MEDICINE RESIDENCY PROGRAM After-Hours Telephone Call    Reason for call:   I received a call from [name] at 5:26 AM, 11/11/2014 indicating her sugar was low at 54    Pertinent Data:   59 yo F with DM II, hgba1c 8.3. Is currently on lantus 22 units which she took last night, and also Actos 30mg  daily. She got up today and checked her sugar as she was having some some sweating. Found it to be 54 and drank some Coke Zero and ate some peanut butter. She denies any other symptoms such as dizziness, n/v, chest pain, headache, blurry vision, numbness, tingling. Patient states she felt fine but she just wanted to let someone know that her sugar was that low. She feels that her diabetes medication regimen is too high.  Patient states she has an appt today in the afternoon at the clinic. She felt fine and she states she will bring this up at her appt.  Asked patient to eat some more carb such as orange juice or candy bar. Patient states she will eat something and get some more rest before she gets up later to take her daughter to the hospital for daughter's surgery. I asked patient to call us if she has any new symptoms.     Assessment / Plan / Recommendations:   Patient will discuss her diabetes regimen at her appt today. She felt fine and did not seem to have any symptoms from her BS of 54 other that sweating which she said was better after changing the AC temperature. Asked her to call us if needed and to have more carbs after she hangs up the phone with me and recheck her sugar.  As always, pt is advised that if symptoms worsen or new symptoms arise, they should go to an urgent care facility or to to ER for further evaluation.    Dellia Nims, MD   11/11/2014, 5:26 AM

## 2014-11-11 NOTE — Patient Instructions (Signed)
-   Continue Enalapril 10 mg daily for your blood pressure - Continue Lantus 22 units daily at bedtime - Continue Actos 30 mg daily - Please buy some juice boxes or bottle of juice (orange, cranberry, apple) to use for your low blood sugars - Follow up in 1 month  General Instructions:   Please bring your medicines with you each time you come to clinic.  Medicines may include prescription medications, over-the-counter medications, herbal remedies, eye drops, vitamins, or other pills.   Progress Toward Treatment Goals:  Treatment Goal 09/02/2014  Hemoglobin A1C unable to assess  Blood pressure unchanged    Self Care Goals & Plans:  Self Care Goal 11/11/2014  Manage my medications take my medicines as prescribed; bring my medications to every visit; refill my medications on time  Monitor my health keep track of my blood glucose; keep track of my blood pressure; bring my glucose meter and log to each visit; check my feet daily  Eat healthy foods eat more vegetables; eat foods that are low in salt; eat baked foods instead of fried foods  Be physically active find an activity I enjoy  Meeting treatment goals -    Home Blood Glucose Monitoring 09/02/2014  Check my blood sugar 2 times a day  When to check my blood sugar before breakfast; before dinner     Care Management & Community Referrals:  Referral 06/17/2014  Referrals made for care management support financial counselor

## 2014-11-15 NOTE — Assessment & Plan Note (Signed)
Discussed increasing Enalapril dose to 20 mg daily but patient refused. Very difficult patient to manage as she does not want to make any changes and also has financial difficulties. Will continue Enalapril 10 mg daily. Encouraged low sodium diet.

## 2014-11-15 NOTE — Assessment & Plan Note (Signed)
Flu shot today 

## 2014-11-15 NOTE — Assessment & Plan Note (Signed)
Unclear etiology as patient has had significant work up including endoscopy, colonoscopy, and multiple CT's of the abd/pelvis. All have not identified a source for her pain. It seems that her pain is never present during clinic, but yet she will go to the ED twice a week due to the abdominal pain. I think a lot of her pain is driven by her anxiety. She refuses to admit or cannot identify that she has anxiety and will not even listen to treatment options including psychotherapy and/or medications. We discussed her calling the clinic instead of going to the ED and she became very upset, stating "if I feel it's an emergency then I have the right to go to the ED and no one can tell me otherwise." I explained that she does have that right, but if she is able to come to the clinic then she should choose that option as we know her well and is more financially suitable for work up of her chronic problems. Will continue to monitor her symptoms for now. I explained to her that I am here in the clinic if she needs me and to try to utilize the clinic resources.

## 2014-11-15 NOTE — Assessment & Plan Note (Signed)
HbA1c elevated at 8.4. Patient's blood sugars fluctuate between hyper and hypoglycemia. Her DM is very difficult to manage because she does not test her blood sugar. This is secondary to her not being able to afford test strips and finding time to test as she takes care of her disabled daughter. Will continue current plan of Lantus 22 units QHS and Actos 30 mg daily. Patient encouraged to come to clinic and use clinic resources to help with her diabetes management. She refused to see Butch Penny (nutrition, DM education) or have me consult Shana with SW for food resources.

## 2014-11-15 NOTE — Progress Notes (Signed)
   Subjective:    Patient ID: [name], female    DOB: 1955-08-12, 59 y.o.   MRN: [id number]  HPI [name] is a 59yo woman with PMHx of HTN, type 2 DM, GERD, chronic abdominal pain, medication non-compliance, and multiple ED visits over the last 6 months who presents today for follow up of her chronic medical conditions as listed below.  HTN: BP mildly elevated today. Her BP continues to be mildly elevated at visits as patient refuses to increase her Enalapril dose from 10 mg daily to 20 mg daily because she does not think she needs to.   Type 2 DM: HbA1c 8.4 today. Patient has difficult to control diabetes as she fluctuates with hyper- and hypoglycemia and does not test frequently. She often forgets to take her Actos as well. She reports she is taking Lantus 22 units QHS and Actos 30 mg daily. She notes she had a low of 54 this morning, but unclear how blood sugars are doing overall as she cannot afford test strips.   Left Hip Pain: Patient reports her pain has improved since getting a steroid injection a few weeks ago. She reports increased range of motion and mobility.   Chronic Abdominal Pain: Patient reports she still occasionally gets a "warm sensation" in her right lower abdomen. She states she does not have the pain currently. She notes her bowel movements are more regular and she thinks that has helped with the pain. She notes increased episodes of pain during stressful times.    Review of Systems General: Denies fever, chills, night sweats, changes in weight, changes in appetite HEENT: Denies headaches, ear pain, changes in vision, rhinorrhea, sore throat CV: Denies CP, palpitations, SOB, orthopnea Pulm: Denies SOB, cough, wheezing GI: Denies nausea, vomiting, diarrhea, constipation, melena, hematochezia GU: Denies dysuria, hematuria, frequency Msk: See HPI Neuro: Denies weakness, numbness, tingling Skin: Denies rashes, bruising Psych: Denies depression, anxiety,  hallucinations    Objective:   Physical Exam General: sitting up in chair, pressured speech, appears anxious HEENT: [place]/AT, EOMI, sclera anicteric, mucus membranes moist CV: RRR, no m/g/r Pulm: CTA bilaterally, breaths non-labored Abd: BS+, soft, non-tender, non-distended Ext: warm, no peripheral edema Neuro: alert and oriented x 3     Assessment & Plan:  Please refer to A&P documentation.

## 2014-11-15 NOTE — Assessment & Plan Note (Signed)
Improved after receiving a steroid injection (unclear where this was performed- patient does not remember). Will continue to monitor for now.

## 2014-11-17 NOTE — Progress Notes (Signed)
Internal Medicine Clinic Attending  Case discussed with Dr. Rivet soon after the resident saw the patient.  We reviewed the resident's history and exam and pertinent patient test results.  I agree with the assessment, diagnosis, and plan of care documented in the resident's note.  

## 2014-11-23 ENCOUNTER — Encounter (HOSPITAL_COMMUNITY): Payer: Self-pay | Admitting: Emergency Medicine

## 2014-11-23 ENCOUNTER — Emergency Department (HOSPITAL_COMMUNITY)
Admission: EM | Admit: 2014-11-23 | Discharge: 2014-11-23 | Disposition: A | Discharge status: Home / Self Care | Payer: No Typology Code available for payment source | Attending: Emergency Medicine | Admitting: Emergency Medicine

## 2014-11-23 DIAGNOSIS — Q6102 Congenital multiple renal cysts: Secondary | ICD-10-CM | POA: Insufficient documentation

## 2014-11-23 DIAGNOSIS — Z87440 Personal history of urinary (tract) infections: Secondary | ICD-10-CM | POA: Insufficient documentation

## 2014-11-23 DIAGNOSIS — G8929 Other chronic pain: Secondary | ICD-10-CM | POA: Insufficient documentation

## 2014-11-23 DIAGNOSIS — M25511 Pain in right shoulder: Secondary | ICD-10-CM | POA: Insufficient documentation

## 2014-11-23 DIAGNOSIS — Z79899 Other long term (current) drug therapy: Secondary | ICD-10-CM | POA: Insufficient documentation

## 2014-11-23 DIAGNOSIS — M1990 Unspecified osteoarthritis, unspecified site: Secondary | ICD-10-CM | POA: Insufficient documentation

## 2014-11-23 DIAGNOSIS — F419 Anxiety disorder, unspecified: Secondary | ICD-10-CM | POA: Insufficient documentation

## 2014-11-23 DIAGNOSIS — I129 Hypertensive chronic kidney disease with stage 1 through stage 4 chronic kidney disease, or unspecified chronic kidney disease: Secondary | ICD-10-CM | POA: Insufficient documentation

## 2014-11-23 DIAGNOSIS — E119 Type 2 diabetes mellitus without complications: Secondary | ICD-10-CM | POA: Insufficient documentation

## 2014-11-23 DIAGNOSIS — N390 Urinary tract infection, site not specified: Secondary | ICD-10-CM | POA: Insufficient documentation

## 2014-11-23 DIAGNOSIS — F329 Major depressive disorder, single episode, unspecified: Secondary | ICD-10-CM | POA: Insufficient documentation

## 2014-11-23 DIAGNOSIS — Z8742 Personal history of other diseases of the female genital tract: Secondary | ICD-10-CM | POA: Insufficient documentation

## 2014-11-23 DIAGNOSIS — Z8719 Personal history of other diseases of the digestive system: Secondary | ICD-10-CM | POA: Insufficient documentation

## 2014-11-23 DIAGNOSIS — Z8774 Personal history of (corrected) congenital malformations of heart and circulatory system: Secondary | ICD-10-CM | POA: Insufficient documentation

## 2014-11-23 DIAGNOSIS — E785 Hyperlipidemia, unspecified: Secondary | ICD-10-CM | POA: Insufficient documentation

## 2014-11-23 DIAGNOSIS — Z862 Personal history of diseases of the blood and blood-forming organs and certain disorders involving the immune mechanism: Secondary | ICD-10-CM | POA: Insufficient documentation

## 2014-11-23 DIAGNOSIS — N183 Chronic kidney disease, stage 3 (moderate): Secondary | ICD-10-CM | POA: Insufficient documentation

## 2014-11-23 DIAGNOSIS — Z794 Long term (current) use of insulin: Secondary | ICD-10-CM | POA: Insufficient documentation

## 2014-11-23 LAB — URINALYSIS, ROUTINE W REFLEX MICROSCOPIC
BILIRUBIN URINE: NEGATIVE
Glucose, UA: NEGATIVE mg/dL
HGB URINE DIPSTICK: NEGATIVE
Ketones, ur: NEGATIVE mg/dL
Nitrite: NEGATIVE
PROTEIN: NEGATIVE mg/dL
Specific Gravity, Urine: 1.015 (ref 1.005–1.030)
UROBILINOGEN UA: 0.2 mg/dL (ref 0.0–1.0)
pH: 7 (ref 5.0–8.0)

## 2014-11-23 LAB — URINE MICROSCOPIC-ADD ON

## 2014-11-23 MED ORDER — TRAMADOL HCL 50 MG PO TABS: 50.0000 mg | ORAL_TABLET | Freq: Four times a day (QID) | ORAL | Status: DC | PRN

## 2014-11-23 MED ORDER — TRAMADOL HCL 50 MG PO TABS
50.0000 mg | ORAL_TABLET | Freq: Once | ORAL | Status: AC
Start: 2014-11-23 — End: 2014-11-23
  Administered 2014-11-23: 50 mg via ORAL
  Filled 2014-11-23: qty 1

## 2014-11-23 MED ORDER — CIPROFLOXACIN HCL 500 MG PO TABS: 500.0000 mg | ORAL_TABLET | Freq: Two times a day (BID) | ORAL | Status: DC

## 2014-11-23 NOTE — ED Provider Notes (Signed)
History  By signing my name below, I, [name], attest that this documentation has been prepared under the direction and in the presence of Maria Haring, PA-C. Electronically Signed: Marlowe Burns, ED Scribe. 11/23/2014. 2:39 PM.  Chief Complaint  Patient presents with  . Shoulder Injury  . Urinary Frequency   The history is provided by the patient and medical records. No language interpreter was used.    HPI Comments:  [name] is a 59 y.o. female who presents to the Emergency Department complaining of severe right intermittent shoulder blade pain that began approximately three days ago. She reports associated right elbow pain as well.  She describes the pain as aching. She states she has had issues with this same area before and states it is arthritis. She has been taking Tylenol arthritis with no significant relief of the pain. She reports taking Tramadol in the past and reports it seemed to work. Certain movements and palpating the area exacerbates the pain. She denies alleviating factors. Pt also reports unrelated urinary frequency. She denies numbness, tingling or weakness of the RUE, bruising, wounds, trauma, injury or fall. She states she intermittently checks her CBGs and reports they are ok. PMHx of DM, HTN, HLD, chronic back pain, anxiety and depression.  Past Medical History  Diagnosis Date  . Diabetes mellitus type II, uncontrolled ([place])   . Hypertension   . Hyperlipidemia   . Ovarian cyst, left   . Anemia     due to menorrhagia, BL 8-10  . Vaginal cyst     nab[facility]ian and bartholin  . Anxiety   . Depression   . Postmenopausal bleeding 06/12/2008  . 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 UTI (lower urinary tract infection)   . IBS (irritable bowel syndrome)   . Chronic back pain   . Chronic abdominal pain   . DDD (degenerative disc disease), lumbar   . Diabetes mellitus without complication ([place])   . History of palpitations    evaluated recently 12'15  . Colitis   . CKD (chronic kidney disease) stage 3, GFR 30-59 ml/min     baseline creatinine 1.4-1.7  . Bilateral renal cysts 01/15/2009    Qualifier: Diagnosis of  By: Tyrell Antonio MD, Jerald Kief     Past Surgical History  Procedure Laterality Date  . Cardiac surgery      to repair congenital defect as a child- 33months old  . Esophagogastroduodenoscopy (egd) with propofol N/A 10/01/2014    Procedure: ESOPHAGOGASTRODUODENOSCOPY (EGD) WITH PROPOFOL;  Surgeon: Arta Silence, MD;  Location: [facility];  Service: Endoscopy;  Laterality: N/A;  . Colonoscopy with propofol N/A 10/01/2014    Procedure: COLONOSCOPY WITH PROPOFOL;  Surgeon: Arta Silence, MD;  Location: [facility];  Service: Endoscopy;  Laterality: N/A;   Family History  Problem Relation Age of Onset  . Stroke Father   . Heart attack Father     Had MI in his 67s  . Stomach cancer Paternal Grandmother   . Diabetes Maternal Grandmother   . Cerebral palsy Daughter   . Anesthesia problems Neg Hx   . Hypotension Neg Hx   . Malignant hyperthermia Neg Hx   . Pseudochol deficiency Neg Hx    Social History  Substance Use Topics  . Smoking status: Never Smoker   . Smokeless tobacco: Never Used  . Alcohol Use: No   OB History    Gravida Para Term Preterm AB TAB SAB Ectopic Multiple Living   3 1  1  2  2   1      Review of Systems  Genitourinary: Positive for frequency.  Musculoskeletal: Positive for myalgias.  Neurological: Negative for weakness and numbness.  All other systems reviewed and are negative.   Allergies  Review of patient's allergies indicates no known allergies.  Home Medications   Prior to Admission medications   Medication Sig Start Date End Date Taking? Authorizing Provider  acetaminophen (TYLENOL) 650 MG CR tablet Take 650 mg by mouth every 8 (eight) hours as needed for pain.    Historical Provider, MD  ciprofloxacin (CIPRO) 500 MG tablet Take 1 tablet (500 mg total) by mouth 2  (two) times daily. 11/23/14   Valorie Mcgrory Carlota Raspberry, PA-C  dicyclomine (BENTYL) 20 MG tablet Take 1 tablet (20 mg total) by mouth every 6 (six) hours as needed for spasms (for abdominal cramping). Patient not taking: Reported on 10/08/2014 09/09/14   Linton Flemings, MD  enalapril (VASOTEC) 10 MG tablet Take 1 tablet (10 mg total) by mouth daily. 06/08/14   Juliet Rude, MD  Insulin Glargine (LANTUS SOLOSTAR) 100 UNIT/ML Solostar Pen Inject 22 Units into the skin daily at 10 pm. 06/10/14   Juliet Rude, MD  pioglitazone (ACTOS) 15 MG tablet Take 2 tablets (30 mg total) by mouth daily. 11/11/14   Juliet Rude, MD  simvastatin (ZOCOR) 40 MG tablet TAKE 1 TABLET (40 MG TOTAL) BY MOUTH AT BEDTIME. Patient taking differently: Take 40 mg by mouth at bedtime.  11/26/13   Ejiroghene Arlyce Dice, MD  sucralfate (CARAFATE) 1 G tablet Take 1 g by mouth 4 (four) times daily as needed (ulcers).    Historical Provider, MD  traMADol (ULTRAM) 50 MG tablet Take 1 tablet (50 mg total) by mouth every 6 (six) hours as needed. 10/15/14 10/15/15  Norman Herrlich, MD  traMADol (ULTRAM) 50 MG tablet Take 1 tablet (50 mg total) by mouth every 6 (six) hours as needed. 11/23/14   Maria Haring, PA-C   Triage Vitals: BP 172/74 mmHg  Pulse 93  Temp(Src) 97.6 F (36.4 C) (Oral)  Resp 18  Ht 5\' 3"  (1.6 m)  Wt 163 lb (73.936 kg)  BMI 28.88 kg/m2  SpO2 98% Physical Exam  Constitutional: She is oriented to person, place, and time. She appears well-developed and well-nourished.  HENT:  Head: Normocephalic and atraumatic.  Eyes: EOM are normal.  Neck: Normal range of motion.  Cardiovascular: Normal rate.   Pulmonary/Chest: Effort normal.  Abdominal: Bowel sounds are normal. There is no tenderness. There is no rigidity, no rebound and no guarding.  Musculoskeletal: Normal range of motion.       Right shoulder: She exhibits tenderness, bony tenderness, pain and spasm. She exhibits normal range of motion, no swelling, no effusion, no crepitus, no  deformity, no laceration, normal pulse and normal strength.  Neurological: She is alert and oriented to person, place, and time.  Skin: Skin is warm and dry.  Psychiatric: She has a normal mood and affect. Her behavior is normal.  Nursing note and vitals reviewed.   ED Course  Procedures (including critical care time) DIAGNOSTIC STUDIES: Oxygen Saturation is 98% on RA, normal by my interpretation.   COORDINATION OF CARE: 2:36 PM- Explained to pt that imaging was not indicated at this time and orthopedist could make the decision for MRI. Will refer to orthopedist. Will order urinalysis. Pt verbalizes understanding and agrees to plan.  Medications - No data to display  Labs Review Labs Reviewed  URINALYSIS, ROUTINE W REFLEX MICROSCOPIC (NOT AT [facility]) - Abnormal; Notable for the following:    APPearance HAZY (*)    Leukocytes, UA LARGE (*)    All other components within normal limits  URINE MICROSCOPIC-ADD ON - Abnormal; Notable for the following:    Squamous Epithelial / LPF FEW (*)    Bacteria, UA FEW (*)    All other components within normal limits  URINE CULTURE    Imaging Review No results found. I have personally reviewed and evaluated these images and lab results as part of my medical decision-making.   EKG Interpretation None      MDM   Final diagnoses:  UTI (lower urinary tract infection)  Right shoulder pain   Patient has large leukocytes and many WBC on urinalysis. Hx of frequent UTIs, reviewed previous cultures and sometimes thjey are multi bacterial and sometimes they come back clean.-- referral to Urology Patient reports Cipro is free and that is what they are usually given to help. Urine culture sent out.   Referral to Ortho for chronic shoulder pain. Rx a few Ultram.  Medications  traMADol (ULTRAM) tablet 50 mg (not administered)    60 y.o.Landrey Morren's medical screening exam was performed and I feel the patient has had an appropriate workup for  their chief complaint at this time and likelihood of emergent condition existing is low. They have been counseled on decision, discharge, follow up and which symptoms necessitate immediate return to the emergency department. They or their family verbally stated understanding and agreement with plan and discharged in stable condition.   Vital signs are stable at discharge. Filed Vitals:   11/23/14 1339  BP: 172/74  Pulse: 93  Temp: 97.6 F (36.4 C)  Resp: 18     I personally performed the services described in this documentation, which was scribed in my presence. The recorded information has been reviewed and is accurate.     Maria Haring, PA-C 11/23/14 [place], MD 11/24/14 [phone]

## 2014-11-23 NOTE — ED Notes (Signed)
53 yof presents to ED with c/o right shoulder blade pain for 3 days. Patient states there is nothing that she was doing at the time the pain started. Denies pain radiating any where. No visible injury noted, no bruising. Denies falling. Patient states she was taking tylenol at home with no relief. Patient states she has been here before for the pain and they did an x-ray. Patient states, "I know its not broken. I need a scan. I need for this pain to go away".

## 2014-11-23 NOTE — Discharge Instructions (Signed)
Urinary Tract Infection Urinary tract infections (UTIs) can develop anywhere along your urinary tract. Your urinary tract is your body's drainage system for removing wastes and extra water. Your urinary tract includes two kidneys, two ureters, a bladder, and a urethra. Your kidneys are a pair of bean-shaped organs. Each kidney is about the size of your fist. They are located below your ribs, one on each side of your spine. CAUSES Infections are caused by microbes, which are microscopic organisms, including fungi, viruses, and bacteria. These organisms are so small that they can only be seen through a microscope. Bacteria are the microbes that most commonly cause UTIs. SYMPTOMS  Symptoms of UTIs may vary by age and gender of the patient and by the location of the infection. Symptoms in young women typically include a frequent and intense urge to urinate and a painful, burning feeling in the bladder or urethra during urination. Older women and men are more likely to be tired, shaky, and weak and have muscle aches and abdominal pain. A fever may mean the infection is in your kidneys. Other symptoms of a kidney infection include pain in your back or sides below the ribs, nausea, and vomiting. DIAGNOSIS To diagnose a UTI, your caregiver will ask you about your symptoms. Your caregiver will also ask you to provide a urine sample. The urine sample will be tested for bacteria and white blood cells. White blood cells are made by your body to help fight infection. TREATMENT  Typically, UTIs can be treated with medication. Because most UTIs are caused by a bacterial infection, they usually can be treated with the use of antibiotics. The choice of antibiotic and length of treatment depend on your symptoms and the type of bacteria causing your infection. HOME CARE INSTRUCTIONS  If you were prescribed antibiotics, take them exactly as your caregiver instructs you. Finish the medication even if you feel better after  you have only taken some of the medication.  Drink enough water and fluids to keep your urine clear or pale yellow.  Avoid caffeine, tea, and carbonated beverages. They tend to irritate your bladder.  Empty your bladder often. Avoid holding urine for long periods of time.  Empty your bladder before and after sexual intercourse.  After a bowel movement, women should cleanse from front to back. Use each tissue only once. SEEK MEDICAL CARE IF:   You have back pain.  You develop a fever.  Your symptoms do not begin to resolve within 3 days. SEEK IMMEDIATE MEDICAL CARE IF:   You have severe back pain or lower abdominal pain.  You develop chills.  You have nausea or vomiting.  You have continued burning or discomfort with urination. MAKE SURE YOU:   Understand these instructions.  Will watch your condition.  Will get help right away if you are not doing well or get worse.   This information is not intended to replace advice given to you by your health care provider. Make sure you discuss any questions you have with your health care provider.   Document Released: 11/03/2004 Document Revised: 10/15/2014 Document Reviewed: 03/04/2011 Elsevier Interactive Patient Education 2016 [place] is a term that is commonly used to refer to joint pain or joint disease. There are more than 100 types of arthritis. CAUSES The most common cause of this condition is wear and tear of a joint. Other causes include:  Gout.  Inflammation of a joint.  An infection of a joint.  Sprains and other injuries  near the joint.  A drug reaction or allergic reaction. In some cases, the cause may not be known. SYMPTOMS The main symptom of this condition is pain in the joint with movement. Other symptoms include:  Redness, swelling, or stiffness at a joint.  Warmth coming from the joint.  Fever.  Overall feeling of illness. DIAGNOSIS This condition may be diagnosed  with a physical exam and tests, including:  Blood tests.  Urine tests.  Imaging tests, such as MRI, X-rays, or a CT scan. Sometimes, fluid is removed from a joint for testing. TREATMENT Treatment for this condition may involve:  Treatment of the cause, if it is known.  Rest.  Raising (elevating) the joint.  Applying cold or hot packs to the joint.  Medicines to improve symptoms and reduce inflammation.  Injections of a steroid such as cortisone into the joint to help reduce pain and inflammation. Depending on the cause of your arthritis, you may need to make lifestyle changes to reduce stress on your joint. These changes may include exercising more and losing weight. HOME CARE INSTRUCTIONS Medicines  Take over-the-counter and prescription medicines only as told by your health care provider.  Do not take aspirin to relieve pain if gout is suspected. Activities  Rest your joint if told by your health care provider. Rest is important when your disease is active and your joint feels painful, swollen, or stiff.  Avoid activities that make the pain worse. It is important to balance activity with rest.  Exercise your joint regularly with range-of-motion exercises as told by your health care provider. Try doing low-impact exercise, such as:  Swimming.  Water aerobics.  Biking.  Walking. Joint Care  If your joint is swollen, keep it elevated if told by your health care provider.  If your joint feels stiff in the morning, try taking a warm shower.  If directed, apply heat to the joint. If you have diabetes, do not apply heat without permission from your health care provider.  Put a towel between the joint and the hot pack or heating pad.  Leave the heat on the area for 20-30 minutes.  If directed, apply ice to the joint:  Put ice in a plastic bag.  Place a towel between your skin and the bag.  Leave the ice on for 20 minutes, 2-3 times per day.  Keep all  follow-up visits as told by your health care provider. This is important. SEEK MEDICAL CARE IF:  The pain gets worse.  You have a fever. SEEK IMMEDIATE MEDICAL CARE IF:  You develop severe joint pain, swelling, or redness.  Many joints become painful and swollen.  You develop severe back pain.  You develop severe weakness in your leg.  You cannot control your bladder or bowels.   This information is not intended to replace advice given to you by your health care provider. Make sure you discuss any questions you have with your health care provider.   Document Released: 03/03/2004 Document Revised: 10/15/2014 Document Reviewed: 04/21/2014 Elsevier Interactive Patient Education [employer].

## 2014-11-23 NOTE — ED Notes (Signed)
Pt reported having urinary symptoms and rt shoulder pain. Pt denies rt shoulder injury/trauma. Pt reported pain has been intermittent for months. (+)PMS, CRT brisk, no deformity/swelling/discoloration, and LROM

## 2014-11-23 NOTE — ED Notes (Signed)
Awake. Verbally responsive. A/O x4. Resp even and unlabored. No audible adventitious breath sounds noted. ABC's intact.  

## 2014-11-25 ENCOUNTER — Other Ambulatory Visit: Payer: Self-pay

## 2014-11-25 ENCOUNTER — Emergency Department (HOSPITAL_COMMUNITY)
Admission: EM | Admit: 2014-11-25 | Discharge: 2014-11-25 | Disposition: A | Discharge status: Home / Self Care | Payer: No Typology Code available for payment source | Attending: Emergency Medicine | Admitting: Emergency Medicine

## 2014-11-25 ENCOUNTER — Emergency Department (HOSPITAL_COMMUNITY): Payer: No Typology Code available for payment source

## 2014-11-25 ENCOUNTER — Encounter (HOSPITAL_COMMUNITY): Payer: Self-pay | Admitting: Emergency Medicine

## 2014-11-25 DIAGNOSIS — N183 Chronic kidney disease, stage 3 (moderate): Secondary | ICD-10-CM | POA: Insufficient documentation

## 2014-11-25 DIAGNOSIS — Z79899 Other long term (current) drug therapy: Secondary | ICD-10-CM | POA: Insufficient documentation

## 2014-11-25 DIAGNOSIS — Z8659 Personal history of other mental and behavioral disorders: Secondary | ICD-10-CM | POA: Insufficient documentation

## 2014-11-25 DIAGNOSIS — Z794 Long term (current) use of insulin: Secondary | ICD-10-CM | POA: Insufficient documentation

## 2014-11-25 DIAGNOSIS — Z8742 Personal history of other diseases of the female genital tract: Secondary | ICD-10-CM | POA: Insufficient documentation

## 2014-11-25 DIAGNOSIS — Q6102 Congenital multiple renal cysts: Secondary | ICD-10-CM | POA: Insufficient documentation

## 2014-11-25 DIAGNOSIS — M546 Pain in thoracic spine: Secondary | ICD-10-CM | POA: Insufficient documentation

## 2014-11-25 DIAGNOSIS — Z87440 Personal history of urinary (tract) infections: Secondary | ICD-10-CM | POA: Insufficient documentation

## 2014-11-25 DIAGNOSIS — Z792 Long term (current) use of antibiotics: Secondary | ICD-10-CM | POA: Insufficient documentation

## 2014-11-25 DIAGNOSIS — G8929 Other chronic pain: Secondary | ICD-10-CM | POA: Insufficient documentation

## 2014-11-25 DIAGNOSIS — M549 Dorsalgia, unspecified: Secondary | ICD-10-CM

## 2014-11-25 DIAGNOSIS — Z8719 Personal history of other diseases of the digestive system: Secondary | ICD-10-CM | POA: Insufficient documentation

## 2014-11-25 DIAGNOSIS — E785 Hyperlipidemia, unspecified: Secondary | ICD-10-CM | POA: Insufficient documentation

## 2014-11-25 DIAGNOSIS — Z9889 Other specified postprocedural states: Secondary | ICD-10-CM | POA: Insufficient documentation

## 2014-11-25 DIAGNOSIS — E119 Type 2 diabetes mellitus without complications: Secondary | ICD-10-CM | POA: Insufficient documentation

## 2014-11-25 DIAGNOSIS — N3000 Acute cystitis without hematuria: Secondary | ICD-10-CM | POA: Insufficient documentation

## 2014-11-25 DIAGNOSIS — I129 Hypertensive chronic kidney disease with stage 1 through stage 4 chronic kidney disease, or unspecified chronic kidney disease: Secondary | ICD-10-CM | POA: Insufficient documentation

## 2014-11-25 DIAGNOSIS — Z862 Personal history of diseases of the blood and blood-forming organs and certain disorders involving the immune mechanism: Secondary | ICD-10-CM | POA: Insufficient documentation

## 2014-11-25 LAB — I-STAT CHEM 8, ED
BUN: 29 mg/dL — AB (ref 6–20)
CALCIUM ION: 1.13 mmol/L (ref 1.12–1.23)
CHLORIDE: 98 mmol/L — AB (ref 101–111)
CREATININE: 1.6 mg/dL — AB (ref 0.44–1.00)
GLUCOSE: 203 mg/dL — AB (ref 65–99)
HCT: 40 % (ref 36.0–46.0)
Hemoglobin: 13.6 g/dL (ref 12.0–15.0)
Potassium: 4.3 mmol/L (ref 3.5–5.1)
Sodium: 136 mmol/L (ref 135–145)
TCO2: 26 mmol/L (ref 0–100)

## 2014-11-25 LAB — URINALYSIS, ROUTINE W REFLEX MICROSCOPIC
Bilirubin Urine: NEGATIVE
GLUCOSE, UA: NEGATIVE mg/dL
HGB URINE DIPSTICK: NEGATIVE
Ketones, ur: NEGATIVE mg/dL
Nitrite: NEGATIVE
PH: 6.5 (ref 5.0–8.0)
Protein, ur: NEGATIVE mg/dL
SPECIFIC GRAVITY, URINE: 1.012 (ref 1.005–1.030)
UROBILINOGEN UA: 0.2 mg/dL (ref 0.0–1.0)

## 2014-11-25 LAB — URINE CULTURE: SPECIAL REQUESTS: NORMAL

## 2014-11-25 LAB — URINE MICROSCOPIC-ADD ON

## 2014-11-25 LAB — I-STAT TROPONIN, ED: TROPONIN I, POC: 0.01 ng/mL (ref 0.00–0.08)

## 2014-11-25 MED ORDER — LIDOCAINE 5 % EX PTCH
1.0000 | MEDICATED_PATCH | CUTANEOUS | Status: DC
  Administered 2014-11-25: 1 via TRANSDERMAL
  Filled 2014-11-25: qty 1

## 2014-11-25 MED ORDER — HYDROCODONE-ACETAMINOPHEN 5-325 MG PO TABS
1.0000 | ORAL_TABLET | Freq: Once | ORAL | Status: DC
  Filled 2014-11-25: qty 1

## 2014-11-25 NOTE — ED Provider Notes (Signed)
CSN: [id number]     Arrival date & time 11/25/14  1341 History   First MD Initiated Contact with Patient 11/25/14 1544     Chief Complaint  Patient presents with  . Shoulder Pain  . Urinary Tract Infection     (Consider location/radiation/quality/duration/timing/severity/associated sxs/prior Treatment) HPI 59 year old female who presents with right scapular pain. History of DM, HTN, HLD, chronic back pain and chronic abdominal pain. Was seen in the ED 2 days ago for right shoulder/scapular pain that has been ongoing for one week. During that time was also having a warm sensation over her bladder, and diagnosed with UTI. Given a course of ciprofloxacin. I has been taking tramadol at home, with persistent pain in her upper right back, around the scapula. States that pain is worse with movement, when she raises her arm, and with palpation. Denies any associating lightheadedness, pleuritic nature of pain, diaphoresis, nausea, vomiting, difficulty breathing, or chest pain. Denies any new trauma. Denies any numbness or weakness. He was told in the past that this may be due to arthritis, but states that this is much worse. Denies abdominal pain, diarrhea, dysuria, flank pain, nausea, or vomiting.  Past Medical History  Diagnosis Date  . Diabetes mellitus type II, uncontrolled ([place])   . Hypertension   . Hyperlipidemia   . Ovarian cyst, left   . Anemia     due to menorrhagia, BL 8-10  . Vaginal cyst     nabothian and bartholin  . Anxiety   . Depression   . Postmenopausal bleeding 06/12/2008  . 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 UTI (lower urinary tract infection)   . IBS (irritable bowel syndrome)   . Chronic back pain   . Chronic abdominal pain   . DDD (degenerative disc disease), lumbar   . Diabetes mellitus without complication ([place])   . History of palpitations     evaluated recently 12'15  . Colitis   . CKD (chronic kidney disease) stage 3, GFR 30-59 ml/min      baseline creatinine 1.4-1.7  . Bilateral renal cysts 01/15/2009    Qualifier: Diagnosis of  By: Tyrell Antonio MD, Jerald Kief     Past Surgical History  Procedure Laterality Date  . Cardiac surgery      to repair congenital defect as a child- 70months old  . Esophagogastroduodenoscopy (egd) with propofol N/A 10/01/2014    Procedure: ESOPHAGOGASTRODUODENOSCOPY (EGD) WITH PROPOFOL;  Surgeon: Arta Silence, MD;  Location: [facility];  Service: Endoscopy;  Laterality: N/A;  . Colonoscopy with propofol N/A 10/01/2014    Procedure: COLONOSCOPY WITH PROPOFOL;  Surgeon: Arta Silence, MD;  Location: [facility];  Service: Endoscopy;  Laterality: N/A;   Family History  Problem Relation Age of Onset  . Stroke Father   . Heart attack Father     Had MI in his 43s  . Stomach cancer Paternal Grandmother   . Diabetes Maternal Grandmother   . Cerebral palsy Daughter   . Anesthesia problems Neg Hx   . Hypotension Neg Hx   . Malignant hyperthermia Neg Hx   . Pseudochol deficiency Neg Hx    Social History  Substance Use Topics  . Smoking status: Never Smoker   . Smokeless tobacco: Never Used  . Alcohol Use: No   OB History    Gravida Para Term Preterm AB TAB SAB Ectopic Multiple Living   3 1 1  2  2    1  Review of Systems 10/14 systems reviewed and are negative other than those stated in the HPI    Allergies  Review of patient's allergies indicates no known allergies.  Home Medications   Prior to Admission medications   Medication Sig Start Date End Date Taking? Authorizing Provider  acetaminophen (TYLENOL) 650 MG CR tablet Take 650 mg by mouth every 8 (eight) hours as needed for pain.   Yes Historical Provider, MD  ciprofloxacin (CIPRO) 500 MG tablet Take 1 tablet (500 mg total) by mouth 2 (two) times daily. 11/23/14  Yes Tiffany Carlota Raspberry, PA-C  enalapril (VASOTEC) 10 MG tablet Take 1 tablet (10 mg total) by mouth daily. 06/08/14  Yes Carly Montey Hora, MD  Insulin Glargine (LANTUS SOLOSTAR)  100 UNIT/ML Solostar Pen Inject 22 Units into the skin daily at 10 pm. 06/10/14  Yes Carly J Rivet, MD  pioglitazone (ACTOS) 15 MG tablet Take 2 tablets (30 mg total) by mouth daily. 11/11/14  Yes Carly Montey Hora, MD  simvastatin (ZOCOR) 40 MG tablet TAKE 1 TABLET (40 MG TOTAL) BY MOUTH AT BEDTIME. Patient taking differently: Take 40 mg by mouth at bedtime.  11/26/13  Yes [name], MD  sucralfate (CARAFATE) 1 G tablet Take 1 g by mouth 4 (four) times daily as needed (ulcers).   Yes Historical Provider, MD  traMADol (ULTRAM) 50 MG tablet Take 1 tablet (50 mg total) by mouth every 6 (six) hours as needed. 10/15/14 10/15/15 Yes Norman Herrlich, MD   BP 141/67 mmHg  Pulse 84  Temp(Src) 98.4 F (36.9 C) (Oral)  Resp 13  SpO2 96% Physical Exam Physical Exam  Nursing note and vitals reviewed. Constitutional: Well developed, well nourished, non-toxic, and in no acute distress Head: Normocephalic and atraumatic.  Mouth/Throat: Oropharynx is clear and moist.  Neck: Normal range of motion. Neck supple.  Cardiovascular: Normal rate and regular rhythm.   Pulmonary/Chest: Effort normal and breath sounds normal.  Abdominal: Soft. There is no tenderness. There is no rebound and no guarding. No CVA tenderness. Musculoskeletal: Normal range of motion. Tender to palpation over right upper back along the superior aspect of the scapula.  Neurological: Alert, no facial droop, fluent speech, moves all extremities symmetrically Skin: Skin is warm and dry.  Psychiatric: Cooperative  ED Course  Procedures (including critical care time) Labs Review Labs Reviewed  URINALYSIS, ROUTINE W REFLEX MICROSCOPIC (NOT AT [facility]) - Abnormal; Notable for the following:    APPearance HAZY (*)    Leukocytes, UA LARGE (*)    All other components within normal limits  URINE MICROSCOPIC-ADD ON - Abnormal; Notable for the following:    Squamous Epithelial / LPF MANY (*)    Bacteria, UA MANY (*)    All other components  within normal limits  I-STAT CHEM 8, ED - Abnormal; Notable for the following:    Chloride 98 (*)    BUN 29 (*)    Creatinine, Ser 1.60 (*)    Glucose, Bld 203 (*)    All other components within normal limits  I-STAT TROPOININ, ED    Imaging Review Dg Chest 2 View  11/25/2014  CLINICAL DATA:  Right scapula pain, back pain for 2 days EXAM: CHEST  2 VIEW COMPARISON:  08/02/2014 FINDINGS: The heart size and mediastinal contours are within normal limits. Both lungs are clear. Degenerative changes lumbar spine. IMPRESSION: No active cardiopulmonary disease. Electronically Signed   By: Lahoma Crocker M.D.   On: 11/25/2014 16:45   I have personally reviewed  and evaluated these images and lab results as part of my medical decision-making.   EKG Interpretation   Date/Time:  Tuesday November 25 2014 17:28:34 EDT Ventricular Rate:  89 PR Interval:  173 QRS Duration: 122 QT Interval:  376 QTC Calculation: 457 R Axis:   31 Text Interpretation:  Sinus rhythm Right bundle branch block Baseline  wander in lead(s) V2 No significant change since last tracing Confirmed by  Cerys Winget MD, Jeoffrey Eleazer [phone]) on 11/25/2014 5:31:54 PM      MDM   Final diagnoses:  Upper back pain on right side  Acute cystitis without hematuria    59 year old female with history of diabetes, hypertension, hyperlipidemia and chronic back pain and abdominal pain who presents with persistent upper back pain and urinary frequency after evaluation in the ED 2 days ago. She is well-appearing and in no acute distress. Vital signs are non-concerning. She has reproducible right scapular pain to palpation and range of motion of her right shoulder. Since that this is exact pain that has been bothering her. Seems very musculoskeletal in nature, and her chest x-ray is unremarkable. Pain is also not worsened with exertion and despite her risk factors of diabetes hypertension and hyperlipidemia does not seem consistent with atypical presentation of  ACS. Troponin is negative and given ongoing pain for 1 week this is not due to ACS. EKG is unchanged from prior EKGs and no acutely ischemic.   She is also noted to have mild AKI with hyperglycemia but no anion gap or evidence of DKA. Has been tolerating plenty of oral fluids here in the emergency department and encouraged to continue oral rehydration at home. UA still suggestive of UTI, but current culture from 2 days ago is still pending. I have asked her to continue her course of ciprofloxacin, and we will contact her if culture grows back bacteria that is not covered with her current antibiotic. Lidoderm patch was applied to her upper back. Discussed further supportive care at home. Strict return and follow-up instructions reviewed. She expected understanding of all discharge instructions for comfortable to plan of care.    Forde Dandy, MD 11/26/14 [phone]

## 2014-11-25 NOTE — ED Notes (Signed)
Pt sts right shoulder pain and "feels shaky" in that arm; pt sts being treated  For UTI as well

## 2014-11-25 NOTE — Discharge Instructions (Signed)
Continue to take your ciprofloxacin for your UTI. We will call if your culture grows out a bacteria not covered with that antibiotics. Continue taking tramadol for pain control. Ice/heat pack to your back at rest. Return for worsening symptoms, including fever, vomiting and unable to keep down food/fluids, or any other symptoms concerning to you.  Back Pain, Adult Back pain is very common. The pain often gets better over time. The cause of back pain is usually not dangerous. Most people can learn to manage their back pain on their own.  HOME CARE  Watch your back pain for any changes. The following actions may help to lessen any pain you are feeling:  Stay active. Start with short walks on flat ground if you can. Try to walk farther each day.  Exercise regularly as told by your doctor. Exercise helps your back heal faster. It also helps avoid future injury by keeping your muscles strong and flexible.  Do not sit, drive, or stand in one place for more than 30 minutes.  Do not stay in bed. Resting more than 1-2 days can slow down your recovery.  Be careful when you bend or lift an object. Use good form when lifting:  Bend at your knees.  Keep the object close to your body.  Do not twist.  Sleep on a firm mattress. Lie on your side, and bend your knees. If you lie on your back, put a pillow under your knees.  Take medicines only as told by your doctor.  Put ice on the injured area.  Put ice in a plastic bag.  Place a towel between your skin and the bag.  Leave the ice on for 20 minutes, 2-3 times a day for the first 2-3 days. After that, you can switch between ice and heat packs.  Avoid feeling anxious or stressed. Find good ways to deal with stress, such as exercise.  Maintain a healthy weight. Extra weight puts stress on your back. GET HELP IF:   You have pain that does not go away with rest or medicine.  You have worsening pain that goes down into your legs or  buttocks.  You have pain that does not get better in one week.  You have pain at night.  You lose weight.  You have a fever or chills. GET HELP RIGHT AWAY IF:   You cannot control when you poop (bowel movement) or pee (urinate).  Your arms or legs feel weak.  Your arms or legs lose feeling (numbness).  You feel sick to your stomach (nauseous) or throw up (vomit).  You have belly (abdominal) pain.  You feel like you may pass out (faint).   This information is not intended to replace advice given to you by your health care provider. Make sure you discuss any questions you have with your health care provider.   Document Released: 07/13/2007 Document Revised: 02/14/2014 Document Reviewed: 05/28/2013 Elsevier Interactive Patient Education [employer].

## 2014-12-10 ENCOUNTER — Emergency Department (INDEPENDENT_AMBULATORY_CARE_PROVIDER_SITE_OTHER)
Admission: EM | Admit: 2014-12-10 | Discharge: 2014-12-10 | Disposition: A | Discharge status: Home / Self Care | Payer: No Typology Code available for payment source | Source: Home / Self Care

## 2014-12-10 ENCOUNTER — Encounter (HOSPITAL_COMMUNITY): Payer: Self-pay | Admitting: Emergency Medicine

## 2014-12-10 DIAGNOSIS — N390 Urinary tract infection, site not specified: Secondary | ICD-10-CM

## 2014-12-10 MED ORDER — CIPROFLOXACIN HCL 500 MG PO TABS
500.0000 mg | ORAL_TABLET | Freq: Two times a day (BID) | ORAL | Status: DC
Start: 2014-12-10 — End: 2015-03-24

## 2014-12-10 NOTE — ED Provider Notes (Signed)
CSN: [id number]     Arrival date & time 12/10/14  1725 History   None    Chief Complaint  Patient presents with  . Urinary Tract Infection   (Consider location/radiation/quality/duration/timing/severity/associated sxs/prior Treatment) Patient is a 59 y.o. female presenting with urinary tract infection. The history is provided by the patient.  Urinary Tract Infection Pain quality:  Aching and burning Pain severity:  Moderate Onset quality:  Gradual Duration:  1 week Timing:  Constant Chronicity:  Recurrent Urinary symptoms: discolored urine, foul-smelling urine and frequent urination     Past Medical History  Diagnosis Date  . Diabetes mellitus type II, uncontrolled ([place])   . Hypertension   . Hyperlipidemia   . Ovarian cyst, left   . Anemia     due to menorrhagia, BL 8-10  . Vaginal cyst     nabothian and bartholin  . Anxiety   . Depression   . Postmenopausal bleeding 06/12/2008  . 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 UTI (lower urinary tract infection)   . IBS (irritable bowel syndrome)   . Chronic back pain   . Chronic abdominal pain   . DDD (degenerative disc disease), lumbar   . Diabetes mellitus without complication ([place])   . History of palpitations     evaluated recently 12'15  . Colitis   . CKD (chronic kidney disease) stage 3, GFR 30-59 ml/min     baseline creatinine 1.4-1.7  . Bilateral renal cysts 01/15/2009    Qualifier: Diagnosis of  By: Tyrell Antonio MD, Jerald Kief     Past Surgical History  Procedure Laterality Date  . Cardiac surgery      to repair congenital defect as a child- 36months old  . Esophagogastroduodenoscopy (egd) with propofol N/A 10/01/2014    Procedure: ESOPHAGOGASTRODUODENOSCOPY (EGD) WITH PROPOFOL;  Surgeon: Arta Silence, MD;  Location: [facility];  Service: Endoscopy;  Laterality: N/A;  . Colonoscopy with propofol N/A 10/01/2014    Procedure: COLONOSCOPY WITH PROPOFOL;  Surgeon: Arta Silence, MD;  Location: [facility];  Service: Endoscopy;  Laterality: N/A;   Family History  Problem Relation Age of Onset  . Stroke Father   . Heart attack Father     Had MI in his 56s  . Stomach cancer Paternal Grandmother   . Diabetes Maternal Grandmother   . Cerebral palsy Daughter   . Anesthesia problems Neg Hx   . Hypotension Neg Hx   . Malignant hyperthermia Neg Hx   . Pseudochol deficiency Neg Hx    Social History  Substance Use Topics  . Smoking status: Never Smoker   . Smokeless tobacco: Never Used  . Alcohol Use: No   OB History    Gravida Para Term Preterm AB TAB SAB Ectopic Multiple Living   3 1 1  2  2   1      Review of Systems  Constitutional: Negative.   HENT: Negative.   Eyes: Negative.   Respiratory: Negative.   Cardiovascular: Negative.   Gastrointestinal: Negative.   Endocrine: Negative.   Genitourinary: Positive for dysuria.  Musculoskeletal: Negative.   Skin: Negative.   Allergic/Immunologic: Negative.   Neurological: Negative.   Hematological: Negative.   Psychiatric/Behavioral: Negative.     Allergies  Review of patient's allergies indicates no known allergies.  Home Medications   Prior to Admission medications   Medication Sig Start Date End Date Taking? Authorizing Provider  acetaminophen (TYLENOL) 650 MG CR tablet Take 650 mg by mouth every 8 (  eight) hours as needed for pain.    Historical Provider, MD  ciprofloxacin (CIPRO) 500 MG tablet Take 1 tablet (500 mg total) by mouth 2 (two) times daily. 12/10/14   Lysbeth Penner, FNP  enalapril (VASOTEC) 10 MG tablet Take 1 tablet (10 mg total) by mouth daily. 06/08/14   Juliet Rude, MD  Insulin Glargine (LANTUS SOLOSTAR) 100 UNIT/ML Solostar Pen Inject 22 Units into the skin daily at 10 pm. 06/10/14   Juliet Rude, MD  pioglitazone (ACTOS) 15 MG tablet Take 2 tablets (30 mg total) by mouth daily. 11/11/14   Juliet Rude, MD  simvastatin (ZOCOR) 40 MG tablet TAKE 1 TABLET (40 MG TOTAL) BY MOUTH AT BEDTIME. Patient  taking differently: Take 40 mg by mouth at bedtime.  11/26/13   Ejiroghene Arlyce Dice, MD  sucralfate (CARAFATE) 1 G tablet Take 1 g by mouth 4 (four) times daily as needed (ulcers).    Historical Provider, MD  traMADol (ULTRAM) 50 MG tablet Take 1 tablet (50 mg total) by mouth every 6 (six) hours as needed. 10/15/14 10/15/15  Norman Herrlich, MD   Meds Ordered and Administered this Visit  Medications - No data to display  BP 162/83 mmHg  Pulse 91  Temp(Src) 97.7 F (36.5 C) (Oral)  Resp 16  SpO2 100% No data found.   Physical Exam  Constitutional: She appears well-developed and well-nourished.  HENT:  Head: Normocephalic and atraumatic.  Right Ear: External ear normal.  Left Ear: External ear normal.  Mouth/Throat: Oropharynx is clear and moist.  Eyes: Conjunctivae and EOM are normal. Pupils are equal, round, and reactive to light.  Neck: Normal range of motion. Neck supple.  Cardiovascular: Normal rate, regular rhythm and normal heart sounds.   Pulmonary/Chest: Effort normal and breath sounds normal.  Abdominal: Soft. Bowel sounds are normal.  Musculoskeletal: Normal range of motion.    ED Course  Procedures (including critical care time)  Labs Review Labs Reviewed - No data to display  Imaging Review No results found.   Visual Acuity Review  Right Eye Distance:   Left Eye Distance:   Bilateral Distance:    Right Eye Near:   Left Eye Near:    Bilateral Near:         MDM   1. UTI (lower urinary tract infection)    Push po fluids and rest.  Cipro 500mg  one po bid x 7 days #14  Lysbeth Penner [place], FNP 12/10/14 1954

## 2014-12-10 NOTE — ED Notes (Signed)
History of the same, odor to urine, feel the need to go, sometimes able to urinate, sometimes not

## 2014-12-10 NOTE — Discharge Instructions (Signed)
Antibiotic Medicine °Antibiotic medicines are used to treat infections caused by bacteria. They work by injuring or killing the bacteria that is making you sick. °HOW IS AN ANTIBIOTIC CHOSEN? °An antibiotic is chosen based on many factors. To help your health care provider choose one for you, tell your health care provider if: °· You have any allergies. °· You are pregnant or plan to get pregnant. °· You are breastfeeding. °· You are taking any medicines. These include over-the-counter medicines, prescription medicines, and herbal remedies. °· You have a medical condition or problem you have not already discussed. °Your health care provider will also consider: °· How often the medicine has to be taken. °· Common side effects of the medicine. °· The cost of the medicine. °· The taste of the medicine. °If you have questions about why an antibiotic was chosen, make sure to ask. °FOR HOW LONG SHOULD I TAKE MY ANTIBIOTIC? °Continue to take your antibiotic for as long as told by your health care provider. Do not stop taking it when you feel better. If you stop taking it too soon: °· You may start to feel sick again. °· Your infection may become harder to treat. °· Complications may develop. °WHAT IF I MISS A DOSE? °Try not to miss any doses of medicine. If you miss a dose, take it as soon as possible. However, if it is almost time for the next dose: °· If you are taking 2 doses per day, take the missed dose and the next dose 5 to 6 hours apart. °· If you are taking 3 or more doses per day, take the missed dose and the next dose 2 to 4 hours apart, then go back to the normal schedule. °If you cannot make up a missed dose, take the next scheduled dose on time. Then take the missed dose after you have taken all the doses as recommended by your health care provider, as if you had one more dose left. °DO ANTIBIOTICS AFFECT BIRTH CONTROL? °Birth control pills may not work while you are on antibiotics. If you are taking birth  control pills, continue taking them as usual and use a second form of birth control, such as a condom, to avoid unwanted pregnancy. Continue using the second form of birth control until you are finished with your current 1 month cycle of birth control pills. °OTHER INFORMATION °· If there is any medicine left over, throw it away. °· Never take someone else's antibiotics. °· Never take leftover antibiotics. °SEEK MEDICAL CARE IF: °· You get worse. °· You do not feel better within a few days of starting the antibiotic medicine. °· You vomit. °· White patches appear in your mouth. °· You have new joint pain that begins after starting the antibiotic. °· You have new muscle aches that begin after starting the antibiotic. °· You had a fever before starting the antibiotic and it returns. °· You have any symptoms of an allergic reaction, such as an itchy rash. If this happens, stop taking the antibiotic. °SEEK IMMEDIATE MEDICAL CARE IF: °· Your urine turns dark or becomes blood-colored. °· Your skin turns yellow. °· You bruise or bleed easily. °· You have severe diarrhea and abdominal cramps. °· You have a severe headache. °· You have signs of a severe allergic reaction, such as: °¨ Trouble breathing. °¨ Wheezing. °¨ Swelling of the lips, tongue, or face. °¨ Fainting. °¨ Blisters on the skin or in the mouth. °If you have signs of a severe allergic   reaction, stop taking the antibiotic right away. °  °This information is not intended to replace advice given to you by your health care provider. Make sure you discuss any questions you have with your health care provider. °  °Document Released: 10/07/2003 Document Revised: 10/15/2014 Document Reviewed: 06/11/2014 °Elsevier Interactive Patient Education ©2016 Elsevier Inc. ° °

## 2014-12-11 NOTE — ED Notes (Signed)
Call from patient, pharmacy does not have her Rx. Review of chart verified fax failed. Called [facility] , left detailed message on their answering machine. Discussed w original  provider

## 2014-12-24 ENCOUNTER — Ambulatory Visit (INDEPENDENT_AMBULATORY_CARE_PROVIDER_SITE_OTHER): Payer: No Typology Code available for payment source | Admitting: Internal Medicine

## 2014-12-24 ENCOUNTER — Ambulatory Visit (INDEPENDENT_AMBULATORY_CARE_PROVIDER_SITE_OTHER): Payer: No Typology Code available for payment source | Admitting: Dietician

## 2014-12-24 ENCOUNTER — Encounter: Payer: Self-pay | Admitting: Internal Medicine

## 2014-12-24 VITALS — BP 126/60 | HR 90 | Temp 98.4°F | Wt 167.7 lb

## 2014-12-24 DIAGNOSIS — E1122 Type 2 diabetes mellitus with diabetic chronic kidney disease: Secondary | ICD-10-CM

## 2014-12-24 DIAGNOSIS — M546 Pain in thoracic spine: Secondary | ICD-10-CM

## 2014-12-24 DIAGNOSIS — N390 Urinary tract infection, site not specified: Secondary | ICD-10-CM | POA: Insufficient documentation

## 2014-12-24 DIAGNOSIS — N183 Chronic kidney disease, stage 3 unspecified: Secondary | ICD-10-CM

## 2014-12-24 DIAGNOSIS — M549 Dorsalgia, unspecified: Secondary | ICD-10-CM

## 2014-12-24 DIAGNOSIS — I10 Essential (primary) hypertension: Secondary | ICD-10-CM

## 2014-12-24 LAB — HM DIABETES EYE EXAM

## 2014-12-24 NOTE — Assessment & Plan Note (Signed)
Last visit to ED/urgent care was ~2 weeks ago for UTI symptoms. No urine studies done at that time. She is currently asymptomatic and says her symptoms resolved after the first 2 days of abx. Given cipro 500mg  x 7days and she says 'she still has 3 left' so obviously incorrect use of medication. Counseled on correct use of medication and she is understanding. She does have a urologist that she finally likes, Dr. Rosana Hoes, and says she will go when she feels like it is an issue again, but is hesitant to pay the co-pay so will hold off at this time.  -Continue to monitor clinically -Urology referral PRN

## 2014-12-24 NOTE — Progress Notes (Signed)
   Patient ID: [name] female   DOB: 09-01-1955 59 y.o.   MRN: [id number]  Subjective:   HPI: [name] is a 59 y.o. with PMH of HTN, DM2, GERD, functional abdominal pain, and recurrent UTIs who presents to [facility] today for follow-up of her pain and UTIs.   Please see problem-based charting for status of medical issues pertinent to this visit.  Review of Systems: Pertinent items noted in HPI and remainder of comprehensive ROS otherwise negative.  Objective:  Physical Exam: Filed Vitals:   12/24/14 1515  BP: 126/60  Pulse: 90  Temp: 98.4 F (36.9 C)  TempSrc: Oral  Weight: 167 lb 11.2 oz (76.068 kg)  SpO2: 100%   Gen: Well-appearing, alert and oriented to person, place, and time HEENT: Oropharynx clear without erythema or exudate.  Neck: No cervical LAD, no thyromegaly or nodules, no JVD noted. CV: Normal rate, regular rhythm, no murmurs, rubs, or gallops Pulmonary: Normal effort, CTA bilaterally, no wheezing, rales, or rhonchi Abdominal: Soft, non-tender, non-distended, without rebound, guarding, or masses Extremities: Distal pulses 2+ in upper and lower extremities bilaterally, no tenderness, erythema or edema Skin: No atypical appearing moles. No rashes  Assessment & Plan:  Please see problem-based charting for assessment and plan.  Blane Ohara, MD Resident Physician, PGY-1 Department of Internal Medicine [facility]

## 2014-12-24 NOTE — Assessment & Plan Note (Signed)
Scapular pain continues to be intermittent and relieved with tylenol and/or gabapentin. Most likely musculoligamentous strain vs nerve impingement. -Continue tylenol and gabapentin PRN for pain, continue full use of arm

## 2014-12-24 NOTE — Assessment & Plan Note (Addendum)
BP Readings from Last 3 Encounters:  12/24/14 126/60  12/10/14 162/83  11/25/14 141/67    Lab Results  Component Value Date   NA 136 11/25/2014   K 4.3 11/25/2014   CREATININE 1.60* 11/25/2014    Assessment: Blood pressure control:  well controlled Progress toward BP goal:   at goal Comments: on enalapril 10  Plan: Medications:  continue current medications

## 2014-12-24 NOTE — Assessment & Plan Note (Signed)
Lab Results  Component Value Date   HGBA1C 8.4 11/11/2014   HGBA1C 8.3 08/05/2014   HGBA1C 8.7 04/29/2014     Assessment: Diabetes control:  difficult to control Progress toward A1C goal:   above goal Comments: patient notes she has been testing her sugars more frequently in the mornings, ranging in the mid 100s. She has checked at night because she is trying to stretch out her strips as she has significant financial issues.  Plan: Medications:  continue current medications Home glucose monitoring: Frequency:  ideally TID but realistic goal at this point is q morning at qhs when able Timing:  Breakfast, lunch, dinner Instruction/counseling given: reminded to get eye exam, reminded to bring medications to each visit, discussed foot care and discussed diet Other plans: She is seeing Butch Penny after our appointment today and agrees to go

## 2014-12-25 NOTE — Progress Notes (Signed)
Internal Medicine Clinic Attending  I saw and evaluated the patient.  I personally confirmed the key portions of the history and exam documented by Dr. Kennedy and I reviewed pertinent patient test results.  The assessment, diagnosis, and plan were formulated together and I agree with the documentation in the resident's note.  

## 2014-12-28 ENCOUNTER — Encounter (HOSPITAL_COMMUNITY): Payer: Self-pay | Admitting: Emergency Medicine

## 2014-12-28 ENCOUNTER — Emergency Department (INDEPENDENT_AMBULATORY_CARE_PROVIDER_SITE_OTHER)
Admission: EM | Admit: 2014-12-28 | Discharge: 2014-12-28 | Disposition: A | Discharge status: Home / Self Care | Payer: Self-pay | Source: Home / Self Care

## 2014-12-28 ENCOUNTER — Emergency Department (INDEPENDENT_AMBULATORY_CARE_PROVIDER_SITE_OTHER): Payer: Self-pay

## 2014-12-28 DIAGNOSIS — M1712 Unilateral primary osteoarthritis, left knee: Secondary | ICD-10-CM

## 2014-12-28 DIAGNOSIS — R52 Pain, unspecified: Secondary | ICD-10-CM

## 2014-12-28 MED ORDER — DICLOFENAC SODIUM 1 % TD GEL
4.0000 g | Freq: Four times a day (QID) | TRANSDERMAL | Status: DC
Start: 2014-12-28 — End: 2015-03-24

## 2014-12-28 NOTE — ED Provider Notes (Signed)
CSN: [id number]     Arrival date & time 12/28/14  1412 History   None    Chief Complaint  Patient presents with  . Knee Pain   (Consider location/radiation/quality/duration/timing/severity/associated sxs/prior Treatment) Patient is a 59 y.o. female presenting with knee pain. The history is provided by the patient.  Knee Pain Location:  Knee Time since incident:  3 months Injury: no   Knee location:  L knee Pain details:    Quality:  Aching   Radiates to:  Does not radiate   Severity:  Moderate   Onset quality:  Gradual   Duration:  3 months   Timing:  Constant   Progression:  Unchanged Chronicity:  Chronic Dislocation: no   Foreign body present:  No foreign bodies Prior injury to area:  No Relieved by:  Nothing   Past Medical History  Diagnosis Date  . Diabetes mellitus type II, uncontrolled ([place])   . Hypertension   . Hyperlipidemia   . Ovarian cyst, left   . Anemia     due to menorrhagia, BL 8-10  . Vaginal cyst     nabothian and bartholin  . Anxiety   . Depression   . Postmenopausal bleeding 06/12/2008  . 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 UTI (lower urinary tract infection)   . IBS (irritable bowel syndrome)   . Chronic back pain   . Chronic abdominal pain   . DDD (degenerative disc disease), lumbar   . Diabetes mellitus without complication ([place])   . History of palpitations     evaluated recently 12'15  . Colitis   . CKD (chronic kidney disease) stage 3, GFR 30-59 ml/min     baseline creatinine 1.4-1.7  . Bilateral renal cysts 01/15/2009    Qualifier: Diagnosis of  By: Tyrell Antonio MD, Jerald Kief     Past Surgical History  Procedure Laterality Date  . Cardiac surgery      to repair congenital defect as a child- 56months old  . Esophagogastroduodenoscopy (egd) with propofol N/A 10/01/2014    Procedure: ESOPHAGOGASTRODUODENOSCOPY (EGD) WITH PROPOFOL;  Surgeon: Arta Silence, MD;  Location: [facility];  Service: Endoscopy;   Laterality: N/A;  . Colonoscopy with propofol N/A 10/01/2014    Procedure: COLONOSCOPY WITH PROPOFOL;  Surgeon: Arta Silence, MD;  Location: [facility];  Service: Endoscopy;  Laterality: N/A;   Family History  Problem Relation Age of Onset  . Stroke Father   . Heart attack Father     Had MI in his 61s  . Stomach cancer Paternal Grandmother   . Diabetes Maternal Grandmother   . Cerebral palsy Daughter   . Anesthesia problems Neg Hx   . Hypotension Neg Hx   . Malignant hyperthermia Neg Hx   . Pseudochol deficiency Neg Hx    Social History  Substance Use Topics  . Smoking status: Never Smoker   . Smokeless tobacco: Never Used  . Alcohol Use: No   OB History    Gravida Para Term Preterm AB TAB SAB Ectopic Multiple Living   3 1 1  2  2   1      Review of Systems  Constitutional: Negative.   HENT: Negative.   Eyes: Negative.   Respiratory: Negative.   Cardiovascular: Negative.   Gastrointestinal: Negative.   Endocrine: Negative.   Genitourinary: Negative.   Musculoskeletal: Positive for arthralgias.       Left knee pain  Skin: Negative.   Allergic/Immunologic: Negative.   Neurological: Negative.  Hematological: Negative.   Psychiatric/Behavioral: Negative.     Allergies  Review of patient's allergies indicates no known allergies.  Home Medications   Prior to Admission medications   Medication Sig Start Date End Date Taking? Authorizing Provider  gabapentin (NEURONTIN) 100 MG capsule Take 100 mg by mouth 3 (three) times daily.   Yes Historical Provider, MD  acetaminophen (TYLENOL) 650 MG CR tablet Take 650 mg by mouth every 8 (eight) hours as needed for pain.    Historical Provider, MD  ciprofloxacin (CIPRO) 500 MG tablet Take 1 tablet (500 mg total) by mouth 2 (two) times daily. 12/10/14   Lysbeth Penner, FNP  enalapril (VASOTEC) 10 MG tablet Take 1 tablet (10 mg total) by mouth daily. 06/08/14   Juliet Rude, MD  Insulin Glargine (LANTUS SOLOSTAR) 100 UNIT/ML  Solostar Pen Inject 22 Units into the skin daily at 10 pm. 06/10/14   Juliet Rude, MD  pioglitazone (ACTOS) 15 MG tablet Take 2 tablets (30 mg total) by mouth daily. 11/11/14   Juliet Rude, MD  simvastatin (ZOCOR) 40 MG tablet TAKE 1 TABLET (40 MG TOTAL) BY MOUTH AT BEDTIME. Patient taking differently: Take 40 mg by mouth at bedtime.  11/26/13   Ejiroghene Arlyce Dice, MD  sucralfate (CARAFATE) 1 G tablet Take 1 g by mouth 4 (four) times daily as needed (ulcers).    Historical Provider, MD  traMADol (ULTRAM) 50 MG tablet Take 1 tablet (50 mg total) by mouth every 6 (six) hours as needed. 10/15/14 10/15/15  Norman Herrlich, MD   Meds Ordered and Administered this Visit  Medications - No data to display  BP 139/71 mmHg  Pulse 88  Temp(Src) 98.1 F (36.7 C) (Oral)  Resp 16  SpO2 99% No data found.   Physical Exam  Constitutional: She is oriented to person, place, and time. She appears well-developed and well-nourished.  HENT:  Head: Normocephalic and atraumatic.  Right Ear: External ear normal.  Left Ear: External ear normal.  Mouth/Throat: Oropharynx is clear and moist.  Eyes: Pupils are equal, round, and reactive to light.  Cardiovascular: Normal rate, regular rhythm and normal heart sounds.   Pulmonary/Chest: Effort normal and breath sounds normal.  Musculoskeletal:  Negative Drawer left knee No crepitus left knee Negative Varus / Valgus strain Pain with palpation left patella, left medial and lateral knee. Decreased ROM left knee.  Neurological: She is alert and oriented to person, place, and time. She has normal reflexes.  Skin: Skin is warm and dry.    ED Course  Procedures (including critical care time)  Labs Review Labs Reviewed - No data to display  Imaging Review No results found.   Visual Acuity Review  Right Eye Distance:   Left Eye Distance:   Bilateral Distance:    Right Eye Near:   Left Eye Near:    Bilateral Near:         MDM   1. Pain   2.  Left Knee Osteoarthritis  Diclofenac gel apply to left knee bid prn pain Follow up with Orthopedics.       Lysbeth Penner, FNP 12/28/14 [place], [place] 12/28/14 [phone]

## 2014-12-28 NOTE — ED Notes (Signed)
The patient presented to the [facility] with a complaint of left knee pain that has "been going on for a long time..." and is getting worse. The patient stated that the ortho surgeon did look at her knee.

## 2014-12-31 ENCOUNTER — Encounter (HOSPITAL_COMMUNITY): Payer: Self-pay | Admitting: Emergency Medicine

## 2014-12-31 ENCOUNTER — Emergency Department (INDEPENDENT_AMBULATORY_CARE_PROVIDER_SITE_OTHER)
Admission: EM | Admit: 2014-12-31 | Discharge: 2014-12-31 | Disposition: A | Discharge status: Home / Self Care | Payer: Self-pay | Source: Home / Self Care | Attending: Family Medicine | Admitting: Family Medicine

## 2014-12-31 ENCOUNTER — Emergency Department (HOSPITAL_COMMUNITY)
Admission: EM | Admit: 2014-12-31 | Discharge: 2014-12-31 | Disposition: A | Discharge status: Home / Self Care | Payer: Self-pay | Attending: Emergency Medicine | Admitting: Emergency Medicine

## 2014-12-31 DIAGNOSIS — Z87440 Personal history of urinary (tract) infections: Secondary | ICD-10-CM | POA: Insufficient documentation

## 2014-12-31 DIAGNOSIS — M25562 Pain in left knee: Secondary | ICD-10-CM

## 2014-12-31 DIAGNOSIS — N183 Chronic kidney disease, stage 3 (moderate): Secondary | ICD-10-CM | POA: Insufficient documentation

## 2014-12-31 DIAGNOSIS — F419 Anxiety disorder, unspecified: Secondary | ICD-10-CM | POA: Insufficient documentation

## 2014-12-31 DIAGNOSIS — E119 Type 2 diabetes mellitus without complications: Secondary | ICD-10-CM | POA: Insufficient documentation

## 2014-12-31 DIAGNOSIS — G8929 Other chronic pain: Secondary | ICD-10-CM | POA: Insufficient documentation

## 2014-12-31 DIAGNOSIS — Z79899 Other long term (current) drug therapy: Secondary | ICD-10-CM | POA: Insufficient documentation

## 2014-12-31 DIAGNOSIS — M1712 Unilateral primary osteoarthritis, left knee: Secondary | ICD-10-CM | POA: Insufficient documentation

## 2014-12-31 DIAGNOSIS — Z8719 Personal history of other diseases of the digestive system: Secondary | ICD-10-CM | POA: Insufficient documentation

## 2014-12-31 DIAGNOSIS — Z794 Long term (current) use of insulin: Secondary | ICD-10-CM | POA: Insufficient documentation

## 2014-12-31 DIAGNOSIS — I129 Hypertensive chronic kidney disease with stage 1 through stage 4 chronic kidney disease, or unspecified chronic kidney disease: Secondary | ICD-10-CM | POA: Insufficient documentation

## 2014-12-31 DIAGNOSIS — Z9889 Other specified postprocedural states: Secondary | ICD-10-CM | POA: Insufficient documentation

## 2014-12-31 DIAGNOSIS — N390 Urinary tract infection, site not specified: Secondary | ICD-10-CM

## 2014-12-31 DIAGNOSIS — F329 Major depressive disorder, single episode, unspecified: Secondary | ICD-10-CM | POA: Insufficient documentation

## 2014-12-31 DIAGNOSIS — E785 Hyperlipidemia, unspecified: Secondary | ICD-10-CM | POA: Insufficient documentation

## 2014-12-31 DIAGNOSIS — Z862 Personal history of diseases of the blood and blood-forming organs and certain disorders involving the immune mechanism: Secondary | ICD-10-CM | POA: Insufficient documentation

## 2014-12-31 DIAGNOSIS — Z792 Long term (current) use of antibiotics: Secondary | ICD-10-CM | POA: Insufficient documentation

## 2014-12-31 DIAGNOSIS — Q249 Congenital malformation of heart, unspecified: Secondary | ICD-10-CM | POA: Insufficient documentation

## 2014-12-31 DIAGNOSIS — Z791 Long term (current) use of non-steroidal anti-inflammatories (NSAID): Secondary | ICD-10-CM | POA: Insufficient documentation

## 2014-12-31 LAB — POCT URINALYSIS DIP (DEVICE)
BILIRUBIN URINE: NEGATIVE
Glucose, UA: NEGATIVE mg/dL
HGB URINE DIPSTICK: NEGATIVE
Ketones, ur: NEGATIVE mg/dL
NITRITE: NEGATIVE
PH: 5.5 (ref 5.0–8.0)
PROTEIN: NEGATIVE mg/dL
Specific Gravity, Urine: 1.015 (ref 1.005–1.030)
Urobilinogen, UA: 0.2 mg/dL (ref 0.0–1.0)

## 2014-12-31 MED ORDER — TOLTERODINE TARTRATE ER 4 MG PO CP24: 4.0000 mg | ORAL_CAPSULE | Freq: Every day | ORAL | Status: DC

## 2014-12-31 MED ORDER — METRONIDAZOLE 500 MG PO TABS: 500.0000 mg | ORAL_TABLET | Freq: Two times a day (BID) | ORAL | Status: DC

## 2014-12-31 NOTE — Discharge Instructions (Signed)
As we discussed today, I think your pain is due to your arthritis flaring up. If you would like to see a new orthopedist, please call [facility]. I have listed their phone number in this packet. In the meantime, you may continue taking tylenol as you have been. I believe you recently received a prescription for Diclofenac gel. This will help with pain and inflammation. You may also ice your knee to help with pain and swelling. Please talk to your primary care provider or your orthopedic doctor about physical therapy as well.    Please obtain all of your results from medical records or have your doctors office obtain the results - share them with your doctor - you should be seen at your doctors office in the next 2 days. Call today to arrange your follow up. Take the medications as prescribed. Please review all of the medicines and only take them if you do not have an allergy to them. Please be aware that if you are taking birth control pills, taking other prescriptions, ESPECIALLY ANTIBIOTICS may make the birth control ineffective - if this is the case, either do not engage in sexual activity or use alternative methods of birth control such as condoms until you have finished the medicine and your family doctor says it is OK to restart them. If you are on a blood thinner such as COUMADIN, be aware that any other medicine that you take may cause the coumadin to either work too much, or not enough - you should have your coumadin level rechecked in next 7 days if this is the case.  ?  It is also a possibility that you have an allergic reaction to any of the medicines that you have been prescribed - Everybody reacts differently to medications and while MOST people have no trouble with most medicines, you may have a reaction such as nausea, vomiting, rash, swelling, shortness of breath. If this is the case, please stop taking the medicine immediately and contact your physician.  ?  You should return to  the ER if you develop severe or worsening symptoms.

## 2014-12-31 NOTE — ED Notes (Signed)
Brought in by EMS from home with c/o left knee pain.  Per EMS, pt reports that she has "neuropathy", and her left knee "gave out" tonight and fell.  Pt reports increased pain to left knee.  Arrived to ED A/Ox4, appears anxious.

## 2014-12-31 NOTE — ED Provider Notes (Signed)
CSN: [id number]     Arrival date & time 12/31/14  1402 History   First MD Initiated Contact with Patient 12/31/14 1533     Chief Complaint  Patient presents with  . Back Pain   (Consider location/radiation/quality/duration/timing/severity/associated sxs/prior Treatment) Patient is a 59 y.o. female presenting with back pain. The history is provided by the patient.  Back Pain Location:  Lumbar spine Quality:  Burning Radiates to:  Does not radiate Pain severity:  Mild Onset quality:  Gradual Chronicity:  New Context: emotional stress   Context: not occupational injury, not recent injury and not twisting   Relieved by:  None tried Worsened by:  Palpation Associated symptoms: pelvic pain   Associated symptoms: no dysuria     Past Medical History  Diagnosis Date  . Diabetes mellitus type II, uncontrolled ([place])   . Hypertension   . Hyperlipidemia   . Ovarian cyst, left   . Anemia     due to menorrhagia, BL 8-10  . Vaginal cyst     nabothian and bartholin  . Anxiety   . Depression   . Postmenopausal bleeding 06/12/2008  . 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 UTI (lower urinary tract infection)   . IBS (irritable bowel syndrome)   . Chronic back pain   . Chronic abdominal pain   . DDD (degenerative disc disease), lumbar   . Diabetes mellitus without complication ([place])   . History of palpitations     evaluated recently 12'15  . Colitis   . CKD (chronic kidney disease) stage 3, GFR 30-59 ml/min     baseline creatinine 1.4-1.7  . Bilateral renal cysts 01/15/2009    Qualifier: Diagnosis of  By: Tyrell Antonio MD, Jerald Kief     Past Surgical History  Procedure Laterality Date  . Cardiac surgery      to repair congenital defect as a child- 3months old  . Esophagogastroduodenoscopy (egd) with propofol N/A 10/01/2014    Procedure: ESOPHAGOGASTRODUODENOSCOPY (EGD) WITH PROPOFOL;  Surgeon: Arta Silence, MD;  Location: [facility];  Service: Endoscopy;   Laterality: N/A;  . Colonoscopy with propofol N/A 10/01/2014    Procedure: COLONOSCOPY WITH PROPOFOL;  Surgeon: Arta Silence, MD;  Location: [facility];  Service: Endoscopy;  Laterality: N/A;   Family History  Problem Relation Age of Onset  . Stroke Father   . Heart attack Father     Had MI in his 42s  . Stomach cancer Paternal Grandmother   . Diabetes Maternal Grandmother   . Cerebral palsy Daughter   . Anesthesia problems Neg Hx   . Hypotension Neg Hx   . Malignant hyperthermia Neg Hx   . Pseudochol deficiency Neg Hx    Social History  Substance Use Topics  . Smoking status: Never Smoker   . Smokeless tobacco: Never Used  . Alcohol Use: No   OB History    Gravida Para Term Preterm AB TAB SAB Ectopic Multiple Living   3 1 1  2  2   1      Review of Systems  Cardiovascular: Negative.   Genitourinary: Positive for urgency, frequency and pelvic pain. Negative for dysuria, hematuria, flank pain, decreased urine volume and dyspareunia.  Musculoskeletal: Positive for back pain.  All other systems reviewed and are negative.   Allergies  Review of patient's allergies indicates no known allergies.  Home Medications   Prior to Admission medications   Medication Sig Start Date End Date Taking? Authorizing Provider  acetaminophen (  TYLENOL) 650 MG CR tablet Take 650 mg by mouth every 8 (eight) hours as needed for pain.    Historical Provider, MD  ciprofloxacin (CIPRO) 500 MG tablet Take 1 tablet (500 mg total) by mouth 2 (two) times daily. 12/10/14   Lysbeth Penner, FNP  diclofenac sodium (VOLTAREN) 1 % GEL Apply 4 g topically 4 (four) times daily. 12/28/14   Lysbeth Penner, FNP  enalapril (VASOTEC) 10 MG tablet Take 1 tablet (10 mg total) by mouth daily. 06/08/14   Carly Montey Hora, MD  gabapentin (NEURONTIN) 100 MG capsule Take 100 mg by mouth 3 (three) times daily.    Historical Provider, MD  Insulin Glargine (LANTUS SOLOSTAR) 100 UNIT/ML Solostar Pen Inject 22 Units into the  skin daily at 10 pm. 06/10/14   Juliet Rude, MD  metroNIDAZOLE (FLAGYL) 500 MG tablet Take 1 tablet (500 mg total) by mouth 2 (two) times daily. 12/31/14   Billy Fischer, MD  pioglitazone (ACTOS) 15 MG tablet Take 2 tablets (30 mg total) by mouth daily. 11/11/14   Juliet Rude, MD  simvastatin (ZOCOR) 40 MG tablet TAKE 1 TABLET (40 MG TOTAL) BY MOUTH AT BEDTIME. Patient taking differently: Take 40 mg by mouth at bedtime.  11/26/13   Ejiroghene Arlyce Dice, MD  sucralfate (CARAFATE) 1 G tablet Take 1 g by mouth 4 (four) times daily as needed (ulcers).    Historical Provider, MD  tolterodine (DETROL LA) 4 MG 24 hr capsule Take 1 capsule (4 mg total) by mouth daily. 12/31/14   Billy Fischer, MD  traMADol (ULTRAM) 50 MG tablet Take 1 tablet (50 mg total) by mouth every 6 (six) hours as needed. 10/15/14 10/15/15  Norman Herrlich, MD   Meds Ordered and Administered this Visit  Medications - No data to display  BP 149/76 mmHg  Pulse 81  Temp(Src) 98.1 F (36.7 C) (Oral)  Resp 16  SpO2 98% No data found.   Physical Exam  Constitutional: She is oriented to person, place, and time. She appears well-developed and well-nourished.  Abdominal: Soft. Bowel sounds are normal. There is no tenderness.  Musculoskeletal: Normal range of motion.  Neurological: She is alert and oriented to person, place, and time. No cranial nerve deficit. Coordination normal.  Skin: Skin is warm and dry.  Nursing note and vitals reviewed.   ED Course  Procedures (including critical care time)  Labs Review Labs Reviewed  POCT URINALYSIS DIP (DEVICE) - Abnormal; Notable for the following:    Leukocytes, UA SMALL (*)    All other components within normal limits    Imaging Review No results found.   Visual Acuity Review  Right Eye Distance:   Left Eye Distance:   Bilateral Distance:    Right Eye Near:   Left Eye Near:    Bilateral Near:         MDM   1. UTI (lower urinary tract infection)         Billy Fischer, MD 12/31/14 [id number]

## 2014-12-31 NOTE — ED Notes (Signed)
Patient reports lower back pain, but has noticed an odor, non-specific complaints.

## 2014-12-31 NOTE — ED Notes (Signed)
Bed: WA09 Expected date:  Expected time:  Means of arrival:  Comments: Knee pain/[id number]

## 2015-01-01 ENCOUNTER — Telehealth: Payer: Self-pay | Admitting: Internal Medicine

## 2015-01-01 ENCOUNTER — Emergency Department (HOSPITAL_COMMUNITY)
Admission: EM | Admit: 2015-01-01 | Discharge: 2015-01-01 | Disposition: A | Discharge status: Home / Self Care | Payer: Self-pay | Attending: Emergency Medicine | Admitting: Emergency Medicine

## 2015-01-01 ENCOUNTER — Emergency Department (HOSPITAL_COMMUNITY): Payer: Self-pay

## 2015-01-01 ENCOUNTER — Encounter (HOSPITAL_COMMUNITY): Payer: Self-pay | Admitting: *Deleted

## 2015-01-01 DIAGNOSIS — Z79899 Other long term (current) drug therapy: Secondary | ICD-10-CM | POA: Insufficient documentation

## 2015-01-01 DIAGNOSIS — I129 Hypertensive chronic kidney disease with stage 1 through stage 4 chronic kidney disease, or unspecified chronic kidney disease: Secondary | ICD-10-CM | POA: Insufficient documentation

## 2015-01-01 DIAGNOSIS — Z8774 Personal history of (corrected) congenital malformations of heart and circulatory system: Secondary | ICD-10-CM | POA: Insufficient documentation

## 2015-01-01 DIAGNOSIS — E785 Hyperlipidemia, unspecified: Secondary | ICD-10-CM | POA: Insufficient documentation

## 2015-01-01 DIAGNOSIS — E119 Type 2 diabetes mellitus without complications: Secondary | ICD-10-CM | POA: Insufficient documentation

## 2015-01-01 DIAGNOSIS — Y998 Other external cause status: Secondary | ICD-10-CM | POA: Insufficient documentation

## 2015-01-01 DIAGNOSIS — W1839XA Other fall on same level, initial encounter: Secondary | ICD-10-CM | POA: Insufficient documentation

## 2015-01-01 DIAGNOSIS — Y9289 Other specified places as the place of occurrence of the external cause: Secondary | ICD-10-CM | POA: Insufficient documentation

## 2015-01-01 DIAGNOSIS — M1712 Unilateral primary osteoarthritis, left knee: Secondary | ICD-10-CM | POA: Insufficient documentation

## 2015-01-01 DIAGNOSIS — Y9389 Activity, other specified: Secondary | ICD-10-CM | POA: Insufficient documentation

## 2015-01-01 DIAGNOSIS — Z87440 Personal history of urinary (tract) infections: Secondary | ICD-10-CM | POA: Insufficient documentation

## 2015-01-01 DIAGNOSIS — Z792 Long term (current) use of antibiotics: Secondary | ICD-10-CM | POA: Insufficient documentation

## 2015-01-01 DIAGNOSIS — N183 Chronic kidney disease, stage 3 (moderate): Secondary | ICD-10-CM | POA: Insufficient documentation

## 2015-01-01 DIAGNOSIS — F329 Major depressive disorder, single episode, unspecified: Secondary | ICD-10-CM | POA: Insufficient documentation

## 2015-01-01 DIAGNOSIS — F419 Anxiety disorder, unspecified: Secondary | ICD-10-CM | POA: Insufficient documentation

## 2015-01-01 DIAGNOSIS — G8929 Other chronic pain: Secondary | ICD-10-CM | POA: Insufficient documentation

## 2015-01-01 DIAGNOSIS — Z8742 Personal history of other diseases of the female genital tract: Secondary | ICD-10-CM | POA: Insufficient documentation

## 2015-01-01 DIAGNOSIS — M1710 Unilateral primary osteoarthritis, unspecified knee: Secondary | ICD-10-CM

## 2015-01-01 DIAGNOSIS — Q6102 Congenital multiple renal cysts: Secondary | ICD-10-CM | POA: Insufficient documentation

## 2015-01-01 DIAGNOSIS — Z794 Long term (current) use of insulin: Secondary | ICD-10-CM | POA: Insufficient documentation

## 2015-01-01 DIAGNOSIS — Z862 Personal history of diseases of the blood and blood-forming organs and certain disorders involving the immune mechanism: Secondary | ICD-10-CM | POA: Insufficient documentation

## 2015-01-01 DIAGNOSIS — Z8719 Personal history of other diseases of the digestive system: Secondary | ICD-10-CM | POA: Insufficient documentation

## 2015-01-01 LAB — CBG MONITORING, ED: GLUCOSE-CAPILLARY: 238 mg/dL — AB (ref 65–99)

## 2015-01-01 MED ORDER — MELOXICAM 7.5 MG PO TABS: 7.5000 mg | ORAL_TABLET | Freq: Every day | ORAL | Status: DC

## 2015-01-01 MED ORDER — NAPROXEN 500 MG PO TABS: 500.0000 mg | ORAL_TABLET | Freq: Once | ORAL | Status: DC

## 2015-01-01 NOTE — Telephone Encounter (Signed)
  Reason for call:   Ms. Weissenborn paged the after-hours pager this evening.  I placed an outgoing call to Ms. Maximino Greenland at 12:04AM but there was no answer.     Jones Bales, MD   01/01/2015, 12:04 AM

## 2015-01-01 NOTE — ED Notes (Signed)
Patient is alert and oriented x3.  She was given DC instructions and follow up visit instructions.  Patient gave verbal understanding. She was DC ambulatory under her own power to home.  V/S stable.  He was not showing any signs of distress on DC 

## 2015-01-01 NOTE — ED Notes (Addendum)
Pt able to arise from wheelchair without assistance. Pt ambulatory from waiting room to triage room. Pt ambulatory with no assistance and with steady gait

## 2015-01-01 NOTE — ED Provider Notes (Addendum)
CSN: [id number]     Arrival date & time 01/01/15  0037 History  By signing my name below, I, Soijett Blue, attest that this documentation has been prepared under the direction and in the presence of Annya Lizana, MD. Electronically Signed: Soijett Blue, ED Scribe. 01/01/2015. 1:48 AM.   Chief Complaint  Patient presents with  . Knee Pain      Patient is a 59 y.o. female presenting with leg pain. The history is provided by the patient. No language interpreter was used.  Leg Pain Location:  Knee Time since incident: tonight. Injury: yes   Mechanism of injury: fall   Fall:    Fall occurred:  Standing   Impact surface:  Unable to specify   Point of impact:  Knees   Entrapped after fall: no   Knee location:  L knee Pain details:    Radiates to:  Does not radiate   Severity:  Moderate   Onset quality:  Sudden   Duration: tonight.   Timing:  Constant   Progression:  Unchanged Chronicity:  New Dislocation: no   Foreign body present:  Unable to specify Prior injury to area:  Unable to specify Relieved by:  None tried Worsened by:  Bearing weight Ineffective treatments:  None tried Associated symptoms: no decreased ROM, no fever, no numbness, no swelling and no tingling     [name] is a 59 y.o. female with a medical hx of DM, HTN, hyperlipidemia, who presents to the Emergency Department complaining of increasing, left knee pain occurring tonight. She notes that her left knee gave out on her tonight and she fell and she is having increasing pain. She has not taken any medications for the relief of her symptoms. She denies swelling, color change, wound, and any other symptoms.     Past Medical History  Diagnosis Date  . Diabetes mellitus type II, uncontrolled ([place])   . Hypertension   . Hyperlipidemia   . Ovarian cyst, left   . Anemia     due to menorrhagia, BL 8-10  . Vaginal cyst     [facility] and bartholin  . Anxiety   . Depression   . Postmenopausal bleeding  06/12/2008  . 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 UTI (lower urinary tract infection)   . IBS (irritable bowel syndrome)   . Chronic back pain   . Chronic abdominal pain   . DDD (degenerative disc disease), lumbar   . Diabetes mellitus without complication ([place])   . History of palpitations     evaluated recently 12'15  . Colitis   . CKD (chronic kidney disease) stage 3, GFR 30-59 ml/min     baseline creatinine 1.4-1.7  . Bilateral renal cysts 01/15/2009    Qualifier: Diagnosis of  By: Tyrell Antonio MD, Jerald Kief     Past Surgical History  Procedure Laterality Date  . Cardiac surgery      to repair congenital defect as a child- 35months old  . Esophagogastroduodenoscopy (egd) with propofol N/A 10/01/2014    Procedure: ESOPHAGOGASTRODUODENOSCOPY (EGD) WITH PROPOFOL;  Surgeon: Arta Silence, MD;  Location: [facility];  Service: Endoscopy;  Laterality: N/A;  . Colonoscopy with propofol N/A 10/01/2014    Procedure: COLONOSCOPY WITH PROPOFOL;  Surgeon: Arta Silence, MD;  Location: [facility];  Service: Endoscopy;  Laterality: N/A;   Family History  Problem Relation Age of Onset  . Stroke Father   . Heart attack Father     Had MI  in his 34s  . Stomach cancer Paternal Grandmother   . Diabetes Maternal Grandmother   . Cerebral palsy Daughter   . Anesthesia problems Neg Hx   . Hypotension Neg Hx   . Malignant hyperthermia Neg Hx   . Pseudochol deficiency Neg Hx    Social History  Substance Use Topics  . Smoking status: Never Smoker   . Smokeless tobacco: Never Used  . Alcohol Use: No   OB History    Gravida Para Term Preterm AB TAB SAB Ectopic Multiple Living   3 1 1  2  2   1      Review of Systems  Constitutional: Negative for fever.  All other systems reviewed and are negative.   A complete 10 system review of systems was obtained and all systems are negative except as noted in the HPI and PMH.    Allergies  Review of patient's allergies  indicates no known allergies.  Home Medications   Prior to Admission medications   Medication Sig Start Date End Date Taking? Authorizing Provider  acetaminophen (TYLENOL) 650 MG CR tablet Take 650 mg by mouth every 8 (eight) hours as needed for pain.   Yes Historical Provider, MD  gabapentin (NEURONTIN) 100 MG capsule Take 100 mg by mouth 3 (three) times daily.   Yes Historical Provider, MD  metroNIDAZOLE (FLAGYL) 500 MG tablet Take 1 tablet (500 mg total) by mouth 2 (two) times daily. 12/31/14  Yes Billy Fischer, MD  pioglitazone (ACTOS) 15 MG tablet Take 2 tablets (30 mg total) by mouth daily. 11/11/14  Yes Carly Montey Hora, MD  ciprofloxacin (CIPRO) 500 MG tablet Take 1 tablet (500 mg total) by mouth 2 (two) times daily. Patient not taking: Reported on 12/31/2014 12/10/14   Lysbeth Penner, FNP  diclofenac sodium (VOLTAREN) 1 % GEL Apply 4 g topically 4 (four) times daily. Patient not taking: Reported on 12/31/2014 12/28/14   Lysbeth Penner, FNP  enalapril (VASOTEC) 10 MG tablet Take 1 tablet (10 mg total) by mouth daily. 06/08/14   Juliet Rude, MD  Insulin Glargine (LANTUS SOLOSTAR) 100 UNIT/ML Solostar Pen Inject 22 Units into the skin daily at 10 pm. 06/10/14   Juliet Rude, MD  simvastatin (ZOCOR) 40 MG tablet TAKE 1 TABLET (40 MG TOTAL) BY MOUTH AT BEDTIME. Patient not taking: Reported on 12/31/2014 11/26/13   Ejiroghene Arlyce Dice, MD  tolterodine (DETROL LA) 4 MG 24 hr capsule Take 1 capsule (4 mg total) by mouth daily. 12/31/14   Billy Fischer, MD  traMADol (ULTRAM) 50 MG tablet Take 1 tablet (50 mg total) by mouth every 6 (six) hours as needed. Patient not taking: Reported on 12/31/2014 10/15/14 10/15/15  Norman Herrlich, MD   BP 180/99 mmHg  Pulse 117  Temp(Src) 98.3 F (36.8 C) (Oral)  Resp 20  SpO2 99% Physical Exam  Constitutional: She is oriented to person, place, and time. She appears well-developed and well-nourished. No distress.  HENT:  Head: Normocephalic and atraumatic.   Mouth/Throat: Oropharynx is clear and moist and mucous membranes are normal. No oropharyngeal exudate.  Eyes: EOM are normal. Pupils are equal, round, and reactive to light.  Neck: Normal range of motion. Neck supple.  Cardiovascular: Normal rate, regular rhythm and normal heart sounds.  Exam reveals no gallop and no friction rub.   No murmur heard. Pulmonary/Chest: Effort normal and breath sounds normal. No respiratory distress. She has no wheezes. She has no rales.  Abdominal: Soft. Bowel  sounds are normal. There is no tenderness. There is no rebound and no guarding.  Musculoskeletal: Normal range of motion.       Left knee: Normal. She exhibits normal range of motion, no swelling, no effusion, no ecchymosis, no deformity, no laceration, no erythema, normal alignment, no LCL laxity, normal patellar mobility, no bony tenderness, normal meniscus and no MCL laxity. No tenderness found. No medial joint line, no lateral joint line, no MCL, no LCL and no patellar tendon tenderness noted.  Negative anterior valgus and varus drawer test. No cords. Soft compartment. Intact posterior tibia. Intact quadriceps tendon. Intact patellar tendon. No step offs of the tibial plateau.   Neurological: She is alert and oriented to person, place, and time.  Skin: Skin is warm and dry.  Psychiatric: She has a normal mood and affect. Her behavior is normal.  Nursing note and vitals reviewed.   ED Course  Procedures (including critical care time) DIAGNOSTIC STUDIES: Oxygen Saturation is 99% on RA, nl by my interpretation.    COORDINATION OF CARE: 1:47 AM Discussed treatment plan with pt at bedside which includes left knee xray, CBG, naprosyn and mobic and pt agreed to plan.    Labs Review Labs Reviewed  CBG MONITORING, ED - Abnormal; Notable for the following:    Glucose-Capillary 238 (*)    All other components within normal limits    Imaging Review Dg Knee Complete 4 Views Left  01/01/2015  CLINICAL  DATA:  Patient's knee gave out a couple of days ago and she fell, injuring the knee. Patient fell again tonight. Painful in the patellar area. Entire knee swelling. EXAM: LEFT KNEE - COMPLETE 4+ VIEW COMPARISON:  12/28/2014 FINDINGS: Tricompartment degenerative changes in the left knee with medial greater than lateral compartment narrowing and diffuse osteophytosis. No evidence of acute fracture or dislocation in the left knee. No significant effusion. No focal bone lesion or bone destruction. Bone cortex appears intact. Possible loose body in the anterior left knee joint. No patellar dislocation. No radiopaque soft tissue foreign bodies. IMPRESSION: Prominent tricompartment degenerative changes in the left knee with possible anterior loose body. No acute fractures. Electronically Signed   By: Lucienne Capers M.D.   On: 01/01/2015 01:38   I have personally reviewed and evaluated these images and lab results as part of my medical decision-making.   EKG Interpretation None      MDM   Final diagnoses:  None    NSAIDs ice and elevation  I personally performed the services described in this documentation, which was scribed in my presence. The recorded information has been reviewed and is accurate.      Veatrice Kells, MD 01/01/15 0151  Gabino Hagin, MD 01/01/15 [phone]

## 2015-01-01 NOTE — ED Notes (Signed)
Patient was placed in room one for assessment.  During assessment patient continually states that she is diabetic and a CBG was ordered where her blood sugar was 238.  Patient was asked if she felt any symptoms of hypoglycemia during her time at [facility] and she denied any symptoms.  Patient left leg was assessed for movement where the leg had complete ROM without any pain.  Patient did states they she did have some soreness in the upper area of her knee on palpation.  Patient denies any past history of falls or issues with the left knee.

## 2015-01-01 NOTE — ED Notes (Signed)
Pt ambulatory in waiting room and to triage; pt states that she fell when she was being discharged earlier and continues to have left knee pain; no obvious swelling, injury or deformity; + pulse + sensation

## 2015-01-01 NOTE — ED Notes (Signed)
When offered a wheelchair to move from triage room to acute room, pt refused. Pts gait remains steady. Pt continues to ambulate without assistance.

## 2015-01-01 NOTE — ED Notes (Signed)
Patient was DC and refused to leave the room.  Charge nurse and security was called.  Patient informed staff that she had no one to call and pick her up and felt she was being mistreated.  Patient was informed that she could stay in the lobby with her daughter till morning where she could attempt to get a ride home.  Patient agreed. Placed in a wheelchair and wheeled to the lobby.  Patient was given the option to stay in the wheelchair but refused and sat beside her daughter.  Patient stated "I don't need a wheelchair if I fall, I fall"

## 2015-01-01 NOTE — Discharge Instructions (Signed)

## 2015-01-02 ENCOUNTER — Encounter (HOSPITAL_COMMUNITY): Payer: Self-pay | Admitting: Emergency Medicine

## 2015-01-02 ENCOUNTER — Emergency Department (INDEPENDENT_AMBULATORY_CARE_PROVIDER_SITE_OTHER)
Admission: EM | Admit: 2015-01-02 | Discharge: 2015-01-02 | Disposition: A | Discharge status: Home / Self Care | Payer: Self-pay | Source: Home / Self Care

## 2015-01-02 DIAGNOSIS — G8929 Other chronic pain: Secondary | ICD-10-CM

## 2015-01-02 DIAGNOSIS — M25562 Pain in left knee: Secondary | ICD-10-CM

## 2015-01-02 NOTE — Discharge Instructions (Signed)
Chronic Pain  Chronic pain can be defined as pain that is off and on and lasts for 3-6 months or longer. Many things cause chronic pain, which can make it difficult to make a diagnosis. There are many treatment options available for chronic pain. However, finding a treatment that works well for you may require trying various approaches until the right one is found. Many people benefit from a combination of two or more types of treatment to control their pain.  SYMPTOMS   Chronic pain can occur anywhere in the body and can range from mild to very severe. Some types of chronic pain include:  · Headache.  · Low back pain.  · Cancer pain.  · Arthritis pain.  · Neurogenic pain. This is pain resulting from damage to nerves.   People with chronic pain may also have other symptoms such as:  · Depression.  · Anger.  · Insomnia.  · Anxiety.  DIAGNOSIS   Your health care provider will help diagnose your condition over time. In many cases, the initial focus will be on excluding possible conditions that could be causing the pain. Depending on your symptoms, your health care provider may order tests to diagnose your condition. Some of these tests may include:   · Blood tests.    · CT scan.    · MRI.    · X-rays.    · Ultrasounds.    · Nerve conduction studies.    You may need to see a specialist.   TREATMENT   Finding treatment that works well may take time. You may be referred to a pain specialist. He or she may prescribe medicine or therapies, such as:   · Mindful meditation or yoga.  · Shots (injections) of numbing or pain-relieving medicines into the spine or area of pain.  · Local electrical stimulation.  · Acupuncture.    · Massage therapy.    · Aroma, color, light, or sound therapy.    · Biofeedback.    · Working with a physical therapist to keep from getting stiff.    · Regular, gentle exercise.    · Cognitive or behavioral therapy.    · Group support.    Sometimes, surgery may be recommended.   HOME CARE INSTRUCTIONS    · Take all medicines as directed by your health care provider.    · Lessen stress in your life by relaxing and doing things such as listening to calming music.    · Exercise or be active as directed by your health care provider.    · Eat a healthy diet and include things such as vegetables, fruits, fish, and lean meats in your diet.    · Keep all follow-up appointments with your health care provider.    · Attend a support group with others suffering from chronic pain.  SEEK MEDICAL CARE IF:   · Your pain gets worse.    · You develop a new pain that was not there before.    · You cannot tolerate medicines given to you by your health care provider.    · You have new symptoms since your last visit with your health care provider.    SEEK IMMEDIATE MEDICAL CARE IF:   · You feel weak.    · You have decreased sensation or numbness.    · You lose control of bowel or bladder function.    · Your pain suddenly gets much worse.    · You develop shaking.  · You develop chills.  · You develop confusion.  · You develop chest pain.  · You develop shortness of breath.    MAKE SURE YOU:  ·   Document Revised: 09/26/2012 Document Reviewed: 07/20/2012 Elsevier Interactive Patient Education 2016 Elsevier Inc. Osteoarthritis Osteoarthritis is a disease that causes soreness and inflammation of a joint. It occurs when the cartilage at the affected joint wears down. Cartilage acts as a cushion, covering the ends of bones where they meet to form a joint. Osteoarthritis is the most common form of arthritis. It often occurs in older people. The joints affected most often by this condition include those in  the:  Ends of the fingers.  Thumbs.  Neck.  Lower back.  Knees.  Hips. CAUSES  Over time, the cartilage that covers the ends of bones begins to wear away. This causes bone to rub on bone, producing pain and stiffness in the affected joints.  RISK FACTORS Certain factors can increase your chances of having osteoarthritis, including:  Older age.  Excessive body weight.  Overuse of joints.  Previous joint injury. SIGNS AND SYMPTOMS   Pain, swelling, and stiffness in the joint.  Over time, the joint may lose its normal shape.  Small deposits of bone (osteophytes) may grow on the edges of the joint.  Bits of bone or cartilage can break off and float inside the joint space. This may cause more pain and damage. DIAGNOSIS  Your health care provider will do a physical exam and ask about your symptoms. Various tests may be ordered, such as:  X-rays of the affected joint.  Blood tests to rule out other types of arthritis. Additional tests may be used to diagnose your condition. TREATMENT  Goals of treatment are to control pain and improve joint function. Treatment plans may include:  A prescribed exercise program that allows for rest and joint relief.  A weight control plan.  Pain relief techniques, such as:  Properly applied heat and cold.  Electric pulses delivered to nerve endings under the skin (transcutaneous electrical nerve stimulation [TENS]).  Massage.  Certain nutritional supplements.  Medicines to control pain, such as:  Acetaminophen.  Nonsteroidal anti-inflammatory drugs (NSAIDs), such as naproxen.  Narcotic or central-acting agents, such as tramadol.  Corticosteroids. These can be given orally or as an injection.  Surgery to reposition the bones and relieve pain (osteotomy) or to remove loose pieces of bone and cartilage. Joint replacement may be needed in advanced states of osteoarthritis. HOME CARE INSTRUCTIONS   Take medicines only as  directed by your health care provider.  Maintain a healthy weight. Follow your health care provider's instructions for weight control. This may include dietary instructions.  Exercise as directed. Your health care provider can recommend specific types of exercise. These may include:  Strengthening exercises. These are done to strengthen the muscles that support joints affected by arthritis. They can be performed with weights or with exercise bands to add resistance.  Aerobic activities. These are exercises, such as brisk walking or low-impact aerobics, that get your heart pumping.  Range-of-motion activities. These keep your joints limber.  Balance and agility exercises. These help you maintain daily living skills.  Rest your affected joints as directed by your health care provider.  Keep all follow-up visits as directed by your health care provider. SEEK MEDICAL CARE IF:   Your skin turns red.  You develop a rash in addition to your joint pain.  You have worsening joint pain.  You have a fever along with joint or muscle aches. SEEK IMMEDIATE MEDICAL CARE IF:  You have a significant loss of weight or appetite.  You have night sweats. FOR MORE INFORMATION  [employer] of Arthritis and Musculoskeletal and Skin Diseases: www.niams.SouthExposed.es  [employer] on Aging: http://kim-miller.com/  American College of Rheumatology: www.rheumatology.org   This information is not intended to replace advice given to you by your health care provider. Make sure you discuss any questions you have with your health care provider.   Document Released: 01/24/2005 Document Revised: 02/14/2014 Document Reviewed: 10/01/2012 Elsevier Interactive Patient Education [employer].

## 2015-01-02 NOTE — ED Notes (Signed)
The patient presented to the [facility] with a complaint of left knee pain. The patient stated that she was seen at the [facility] on 11/20 and [place] ED on 11/24 for the same complaint. The patient was ambulatory without assistance.

## 2015-01-04 ENCOUNTER — Emergency Department (HOSPITAL_COMMUNITY)
Admission: EM | Admit: 2015-01-04 | Discharge: 2015-01-04 | Disposition: A | Discharge status: Home / Self Care | Payer: Self-pay | Attending: Emergency Medicine | Admitting: Emergency Medicine

## 2015-01-04 ENCOUNTER — Encounter (HOSPITAL_COMMUNITY): Payer: Self-pay | Admitting: *Deleted

## 2015-01-04 DIAGNOSIS — Z79899 Other long term (current) drug therapy: Secondary | ICD-10-CM | POA: Insufficient documentation

## 2015-01-04 DIAGNOSIS — N183 Chronic kidney disease, stage 3 (moderate): Secondary | ICD-10-CM | POA: Insufficient documentation

## 2015-01-04 DIAGNOSIS — M25562 Pain in left knee: Secondary | ICD-10-CM

## 2015-01-04 DIAGNOSIS — Z87440 Personal history of urinary (tract) infections: Secondary | ICD-10-CM | POA: Insufficient documentation

## 2015-01-04 DIAGNOSIS — Z862 Personal history of diseases of the blood and blood-forming organs and certain disorders involving the immune mechanism: Secondary | ICD-10-CM | POA: Insufficient documentation

## 2015-01-04 DIAGNOSIS — Y9389 Activity, other specified: Secondary | ICD-10-CM | POA: Insufficient documentation

## 2015-01-04 DIAGNOSIS — Y9289 Other specified places as the place of occurrence of the external cause: Secondary | ICD-10-CM | POA: Insufficient documentation

## 2015-01-04 DIAGNOSIS — Z8742 Personal history of other diseases of the female genital tract: Secondary | ICD-10-CM | POA: Insufficient documentation

## 2015-01-04 DIAGNOSIS — F419 Anxiety disorder, unspecified: Secondary | ICD-10-CM | POA: Insufficient documentation

## 2015-01-04 DIAGNOSIS — I129 Hypertensive chronic kidney disease with stage 1 through stage 4 chronic kidney disease, or unspecified chronic kidney disease: Secondary | ICD-10-CM | POA: Insufficient documentation

## 2015-01-04 DIAGNOSIS — G8929 Other chronic pain: Secondary | ICD-10-CM | POA: Insufficient documentation

## 2015-01-04 DIAGNOSIS — Y998 Other external cause status: Secondary | ICD-10-CM | POA: Insufficient documentation

## 2015-01-04 DIAGNOSIS — E785 Hyperlipidemia, unspecified: Secondary | ICD-10-CM | POA: Insufficient documentation

## 2015-01-04 DIAGNOSIS — E119 Type 2 diabetes mellitus without complications: Secondary | ICD-10-CM | POA: Insufficient documentation

## 2015-01-04 DIAGNOSIS — F329 Major depressive disorder, single episode, unspecified: Secondary | ICD-10-CM | POA: Insufficient documentation

## 2015-01-04 DIAGNOSIS — W01198A Fall on same level from slipping, tripping and stumbling with subsequent striking against other object, initial encounter: Secondary | ICD-10-CM | POA: Insufficient documentation

## 2015-01-04 DIAGNOSIS — S8992XA Unspecified injury of left lower leg, initial encounter: Secondary | ICD-10-CM | POA: Insufficient documentation

## 2015-01-04 NOTE — ED Notes (Signed)
The pt tripped and fell Friday.  She went to [facility] [facility] ed and has been seen  At [facility] for the same. She has a sleeve on the lt knee with sl swelling.   She was xrayed at [facility] long ed.  Her knee continues to hurt

## 2015-01-04 NOTE — Discharge Instructions (Signed)

## 2015-01-04 NOTE — ED Notes (Signed)
See providers assessment.  

## 2015-01-04 NOTE — ED Provider Notes (Signed)
CSN: [id number]     Arrival date & time 01/04/15  1909 History   First MD Initiated Contact with Patient 01/04/15 1954     Chief Complaint  Patient presents with  . Knee Injury    HPI   59 year old female presents today with left knee pain. Patient reports that on Friday she tripped and fell landed on her left knee. She was seen at [facility] long in urgent care for the same. She reports receiving a sleeve for the knee that has improved her symptoms slightly. She describes the pain is sharp, worse with ambulation, improved by rest. She reports the pain is deep in her knee, and is not made worse by palpation. She reports trying Tylenol at home with minimal improvement in symptoms. She's been seen numerous times for this prior with a diagnosis of osteoarthritis. Patient reports symptoms have not improved or worsen, she denies any loss of distal sensation strength or motor function. She denies any warmth or swelling to the knee.    Past Medical History  Diagnosis Date  . Diabetes mellitus type II, uncontrolled ([place])   . Hypertension   . Hyperlipidemia   . Ovarian cyst, left   . Anemia     due to menorrhagia, BL 8-10  . Vaginal cyst     nabothian and bartholin  . Anxiety   . Depression   . Postmenopausal bleeding 06/12/2008  . 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 UTI (lower urinary tract infection)   . IBS (irritable bowel syndrome)   . Chronic back pain   . Chronic abdominal pain   . DDD (degenerative disc disease), lumbar   . Diabetes mellitus without complication ([place])   . History of palpitations     evaluated recently 12'15  . Colitis   . CKD (chronic kidney disease) stage 3, GFR 30-59 ml/min     baseline creatinine 1.4-1.7  . Bilateral renal cysts 01/15/2009    Qualifier: Diagnosis of  By: Tyrell Antonio MD, Jerald Kief     Past Surgical History  Procedure Laterality Date  . Cardiac surgery      to repair congenital defect as a child- 9months old  .  Esophagogastroduodenoscopy (egd) with propofol N/A 10/01/2014    Procedure: ESOPHAGOGASTRODUODENOSCOPY (EGD) WITH PROPOFOL;  Surgeon: Arta Silence, MD;  Location: [facility];  Service: Endoscopy;  Laterality: N/A;  . Colonoscopy with propofol N/A 10/01/2014    Procedure: COLONOSCOPY WITH PROPOFOL;  Surgeon: Arta Silence, MD;  Location: [facility];  Service: Endoscopy;  Laterality: N/A;   Family History  Problem Relation Age of Onset  . Stroke Father   . Heart attack Father     Had MI in his 99s  . Stomach cancer Paternal Grandmother   . Diabetes Maternal Grandmother   . Cerebral palsy Daughter   . Anesthesia problems Neg Hx   . Hypotension Neg Hx   . Malignant hyperthermia Neg Hx   . Pseudochol deficiency Neg Hx    Social History  Substance Use Topics  . Smoking status: Never Smoker   . Smokeless tobacco: Never Used  . Alcohol Use: No   OB History    Gravida Para Term Preterm AB TAB SAB Ectopic Multiple Living   3 1 1  2  2   1      Review of Systems  All other systems reviewed and are negative.     Allergies  Review of patient's allergies indicates no known allergies.  Home Medications  Prior to Admission medications   Medication Sig Start Date End Date Taking? Authorizing Provider  acetaminophen (TYLENOL) 650 MG CR tablet Take 650 mg by mouth every 8 (eight) hours as needed for pain.    Historical Provider, MD  ciprofloxacin (CIPRO) 500 MG tablet Take 1 tablet (500 mg total) by mouth 2 (two) times daily. Patient not taking: Reported on 12/31/2014 12/10/14   Lysbeth Penner, FNP  diclofenac sodium (VOLTAREN) 1 % GEL Apply 4 g topically 4 (four) times daily. Patient not taking: Reported on 12/31/2014 12/28/14   Lysbeth Penner, FNP  enalapril (VASOTEC) 10 MG tablet Take 1 tablet (10 mg total) by mouth daily. 06/08/14   Carly Montey Hora, MD  gabapentin (NEURONTIN) 100 MG capsule Take 100 mg by mouth 3 (three) times daily.    Historical Provider, MD  Insulin Glargine  (LANTUS SOLOSTAR) 100 UNIT/ML Solostar Pen Inject 22 Units into the skin daily at 10 pm. 06/10/14   Juliet Rude, MD  meloxicam (MOBIC) 7.5 MG tablet Take 1 tablet (7.5 mg total) by mouth daily. 01/01/15   April Palumbo, MD  metroNIDAZOLE (FLAGYL) 500 MG tablet Take 1 tablet (500 mg total) by mouth 2 (two) times daily. 12/31/14   Billy Fischer, MD  pioglitazone (ACTOS) 15 MG tablet Take 2 tablets (30 mg total) by mouth daily. 11/11/14   Juliet Rude, MD  simvastatin (ZOCOR) 40 MG tablet TAKE 1 TABLET (40 MG TOTAL) BY MOUTH AT BEDTIME. Patient not taking: Reported on 12/31/2014 11/26/13   Ejiroghene Arlyce Dice, MD  tolterodine (DETROL LA) 4 MG 24 hr capsule Take 1 capsule (4 mg total) by mouth daily. 12/31/14   Billy Fischer, MD  traMADol (ULTRAM) 50 MG tablet Take 1 tablet (50 mg total) by mouth every 6 (six) hours as needed. Patient not taking: Reported on 12/31/2014 10/15/14 10/15/15  Norman Herrlich, MD   BP 155/83 mmHg  Pulse 91  Temp(Src) 97.4 F (36.3 C) (Oral)  Resp 16  Ht 5\' 2"  (1.575 m)  Wt 75.751 kg  BMI 30.54 kg/m2  SpO2 96% Physical Exam  Constitutional: She is oriented to person, place, and time. She appears well-developed and well-nourished.  HENT:  Head: Normocephalic and atraumatic.  Eyes: Conjunctivae are normal. Pupils are equal, round, and reactive to light. Right eye exhibits no discharge. Left eye exhibits no discharge. No scleral icterus.  Neck: Normal range of motion. No JVD present. No tracheal deviation present.  Pulmonary/Chest: Effort normal. No stridor.  Musculoskeletal:  External examination is bilateral show no obvious swelling or deformity. Very minor area of bruising to the left anterior shin, nontender to palpation. Knee is nontender palpation, no valgus varus, anterior posterior laxity. Patella grind is negative. Distal extremity strength 5 out of 5, sensation grossly intact. Full active range of motion of the knee.  Neurological: She is alert and oriented to  person, place, and time. Coordination normal.  Psychiatric: She has a normal mood and affect. Her behavior is normal. Judgment and thought content normal.  Nursing note and vitals reviewed.   ED Course  Procedures (including critical care time) Labs Review Labs Reviewed - No data to display  Imaging Review No results found. I have personally reviewed and evaluated these images and lab results as part of my medical decision-making.   EKG Interpretation None      MDM   Final diagnoses:  Left knee pain    Labs:  Imaging:  Consults:  Therapeutics:  Discharge Meds:  Assessment/Plan: Patient's presentation most consistent with osteoarthritis of the knee. She'll be encouraged to follow-up with orthopedic specialist for further evaluation and management. Tylenol as needed for pain, ice, rest, strict return precautions given. Low suspicion for septic or gouty arthritis or any significant intra-articular damage.         Okey Regal, PA-C 01/04/15 [id number]  Merrily Pew, MD 01/06/15 [phone]

## 2015-01-08 NOTE — ED Provider Notes (Signed)
CSN: [id number]     Arrival date & time 01/02/15  1421 History   None    Chief Complaint  Patient presents with  . Knee Pain   (Consider location/radiation/quality/duration/timing/severity/associated sxs/prior Treatment) Patient is a 59 y.o. female presenting with knee pain.  Knee Pain History obtained from patient:   LOCATION:left knee SEVERITY:8 DURATION:ongoing for a couple of weeks CONTEXT:history of OA has been seen at [facility], has been offered sleeve which did help for a bit now requesting injection. As it was offered by another UC provider.  QUALITY: MODIFYING FACTORS: oral medications, unable to take NSAIDS ASSOCIATED SYMPTOMS: chronic left knee pain TIMING: constant OCCUPATION:disabled   Past Medical History  Diagnosis Date  . Diabetes mellitus type II, uncontrolled ([place])   . Hypertension   . Hyperlipidemia   . Ovarian cyst, left   . Anemia     due to menorrhagia, BL 8-10  . Vaginal cyst     nabothian and bartholin  . Anxiety   . Depression   . Postmenopausal bleeding 06/12/2008  . 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 UTI (lower urinary tract infection)   . IBS (irritable bowel syndrome)   . Chronic back pain   . Chronic abdominal pain   . DDD (degenerative disc disease), lumbar   . Diabetes mellitus without complication ([place])   . History of palpitations     evaluated recently 12'15  . Colitis   . CKD (chronic kidney disease) stage 3, GFR 30-59 ml/min     baseline creatinine 1.4-1.7  . Bilateral renal cysts 01/15/2009    Qualifier: Diagnosis of  By: Tyrell Antonio MD, Jerald Kief     Past Surgical History  Procedure Laterality Date  . Cardiac surgery      to repair congenital defect as a child- 22months old  . Esophagogastroduodenoscopy (egd) with propofol N/A 10/01/2014    Procedure: ESOPHAGOGASTRODUODENOSCOPY (EGD) WITH PROPOFOL;  Surgeon: Arta Silence, MD;  Location: [facility];  Service: Endoscopy;  Laterality: N/A;  . Colonoscopy  with propofol N/A 10/01/2014    Procedure: COLONOSCOPY WITH PROPOFOL;  Surgeon: Arta Silence, MD;  Location: [facility];  Service: Endoscopy;  Laterality: N/A;   Family History  Problem Relation Age of Onset  . Stroke Father   . Heart attack Father     Had MI in his 67s  . Stomach cancer Paternal Grandmother   . Diabetes Maternal Grandmother   . Cerebral palsy Daughter   . Anesthesia problems Neg Hx   . Hypotension Neg Hx   . Malignant hyperthermia Neg Hx   . Pseudochol deficiency Neg Hx    Social History  Substance Use Topics  . Smoking status: Never Smoker   . Smokeless tobacco: Never Used  . Alcohol Use: No   OB History    Gravida Para Term Preterm AB TAB SAB Ectopic Multiple Living   3 1 1  2  2   1      Review of Systems +'ve chronic left knee pain,  Denies fever, redness Allergies  Review of patient's allergies indicates no known allergies.  Home Medications   Prior to Admission medications   Medication Sig Start Date End Date Taking? Authorizing Provider  acetaminophen (TYLENOL) 650 MG CR tablet Take 650 mg by mouth every 8 (eight) hours as needed for pain.    Historical Provider, MD  ciprofloxacin (CIPRO) 500 MG tablet Take 1 tablet (500 mg total) by mouth 2 (two) times daily. Patient not  taking: Reported on 12/31/2014 12/10/14   Lysbeth Penner, FNP  diclofenac sodium (VOLTAREN) 1 % GEL Apply 4 g topically 4 (four) times daily. Patient not taking: Reported on 12/31/2014 12/28/14   Lysbeth Penner, FNP  enalapril (VASOTEC) 10 MG tablet Take 1 tablet (10 mg total) by mouth daily. 06/08/14   Carly Montey Hora, MD  gabapentin (NEURONTIN) 100 MG capsule Take 100 mg by mouth 3 (three) times daily.    Historical Provider, MD  Insulin Glargine (LANTUS SOLOSTAR) 100 UNIT/ML Solostar Pen Inject 22 Units into the skin daily at 10 pm. 06/10/14   Juliet Rude, MD  meloxicam (MOBIC) 7.5 MG tablet Take 1 tablet (7.5 mg total) by mouth daily. 01/01/15   April Palumbo, MD   metroNIDAZOLE (FLAGYL) 500 MG tablet Take 1 tablet (500 mg total) by mouth 2 (two) times daily. 12/31/14   Billy Fischer, MD  pioglitazone (ACTOS) 15 MG tablet Take 2 tablets (30 mg total) by mouth daily. 11/11/14   Juliet Rude, MD  simvastatin (ZOCOR) 40 MG tablet TAKE 1 TABLET (40 MG TOTAL) BY MOUTH AT BEDTIME. Patient not taking: Reported on 12/31/2014 11/26/13   Ejiroghene Arlyce Dice, MD  tolterodine (DETROL LA) 4 MG 24 hr capsule Take 1 capsule (4 mg total) by mouth daily. 12/31/14   Billy Fischer, MD  traMADol (ULTRAM) 50 MG tablet Take 1 tablet (50 mg total) by mouth every 6 (six) hours as needed. Patient not taking: Reported on 12/31/2014 10/15/14 10/15/15  Norman Herrlich, MD   Meds Ordered and Administered this Visit  Medications - No data to display  BP 154/89 mmHg  Pulse 100  Temp(Src) 98 F (36.7 C) (Oral)  SpO2 95% No data found.   Physical Exam  Constitutional: She appears well-developed and well-nourished.  Musculoskeletal:       Left knee: Normal.  Nursing note and vitals reviewed.   ED Course  Procedures (including critical care time)  Labs Review Labs Reviewed - No data to display  Imaging Review No results found.   Visual Acuity Review  Right Eye Distance:   Left Eye Distance:   Bilateral Distance:    Right Eye Near:   Left Eye Near:    Bilateral Near:         MDM   1. Knee pain, chronic, left    Discussed case with Dr. Juventino Slovak who saw patient with me.  Agreed with plan to give knee immobilizer.  Follow up     Konrad Felix, PA 01/08/15 [place], [place] 01/08/15 [id number]

## 2015-01-20 ENCOUNTER — Encounter (HOSPITAL_COMMUNITY): Payer: Self-pay | Admitting: *Deleted

## 2015-01-20 ENCOUNTER — Emergency Department (INDEPENDENT_AMBULATORY_CARE_PROVIDER_SITE_OTHER)
Admission: EM | Admit: 2015-01-20 | Discharge: 2015-01-20 | Disposition: A | Discharge status: Home / Self Care | Payer: Self-pay | Source: Home / Self Care

## 2015-01-20 DIAGNOSIS — R5382 Chronic fatigue, unspecified: Secondary | ICD-10-CM

## 2015-01-20 LAB — POCT URINALYSIS DIP (DEVICE)
Bilirubin Urine: NEGATIVE
GLUCOSE, UA: NEGATIVE mg/dL
Hgb urine dipstick: NEGATIVE
Ketones, ur: NEGATIVE mg/dL
NITRITE: NEGATIVE
Protein, ur: NEGATIVE mg/dL
Specific Gravity, Urine: 1.02 (ref 1.005–1.030)
UROBILINOGEN UA: 0.2 mg/dL (ref 0.0–1.0)
pH: 6 (ref 5.0–8.0)

## 2015-01-20 NOTE — ED Notes (Signed)
Pt  Reports  Symptoms  Of     Frequency  Of  Urination     With  Pain  r  Side      With  Symptoms  For   sev  Days          Pt  Reports  Urine  Smells  Foul

## 2015-01-20 NOTE — Discharge Instructions (Signed)
Fatigue Fatigue is feeling tired all of the time, a lack of energy, or a lack of motivation. Occasional or mild fatigue is often a normal response to activity or life in general. However, long-lasting (chronic) or extreme fatigue may indicate an underlying medical condition. HOME CARE INSTRUCTIONS  Watch your fatigue for any changes. The following actions may help to lessen any discomfort you are feeling:  Talk to your health care provider about how much sleep you need each night. Try to get the required amount every night.  Take medicines only as directed by your health care provider.  Eat a healthy and nutritious diet. Ask your health care provider if you need help changing your diet.  Drink enough fluid to keep your urine clear or pale yellow.  Practice ways of relaxing, such as yoga, meditation, massage therapy, or acupuncture.  Exercise regularly.   Change situations that cause you stress. Try to keep your work and personal routine reasonable.  Do not abuse illegal drugs.  Limit alcohol intake to no more than 1 drink per day for nonpregnant women and 2 drinks per day for men. One drink equals 12 ounces of beer, 5 ounces of wine, or 1 ounces of hard liquor.  Take a multivitamin, if directed by your health care provider. SEEK MEDICAL CARE IF:   Your fatigue does not get better.  You have a fever.   You have unintentional weight loss or gain.  You have headaches.   You have difficulty:   Falling asleep.  Sleeping throughout the night.  You feel angry, guilty, anxious, or sad.   You are unable to have a bowel movement (constipation).   You skin is dry.   Your legs or another part of your body is swollen.  SEEK IMMEDIATE MEDICAL CARE IF:   You feel confused.   Your vision is blurry.  You feel faint or pass out.   You have a severe headache.   You have severe abdominal, pelvic, or back pain.   You have chest pain, shortness of breath, or an  irregular or fast heartbeat.   You are unable to urinate or you urinate less than normal.   You develop abnormal bleeding, such as bleeding from the rectum, vagina, nose, lungs, or nipples.  You vomit blood.   You have thoughts about harming yourself or committing suicide.   You are worried that you might harm someone else.    This information is not intended to replace advice given to you by your health care provider. Make sure you discuss any questions you have with your health care provider.   Document Released: 11/21/2006 Document Revised: 02/14/2014 Document Reviewed: 05/28/2013 Elsevier Interactive Patient Education 2016 [employer]  Weakness Weakness is a lack of strength. It may be felt all over the body (generalized) or in one specific part of the body (focal). Some causes of weakness can be serious. You may need further medical evaluation, especially if you are elderly or you have a history of immunosuppression (such as chemotherapy or HIV), kidney disease, heart disease, or diabetes. CAUSES  Weakness can be caused by many different things, including:  Infection.  Physical exhaustion.  Internal bleeding or other blood loss that results in a lack of red blood cells (anemia).  Dehydration. This cause is more common in elderly people.  Side effects or electrolyte abnormalities from medicines, such as pain medicines or sedatives.  Emotional distress, anxiety, or depression.  Circulation problems, especially severe peripheral arterial disease.  Heart  disease, such as rapid atrial fibrillation, bradycardia, or heart failure.  Nervous system disorders, such as Guillain-Barr syndrome, multiple sclerosis, or stroke. DIAGNOSIS  To find the cause of your weakness, your caregiver will take your history and perform a physical exam. Lab tests or X-rays may also be ordered, if needed. TREATMENT  Treatment of weakness depends on the cause of your symptoms and can vary  greatly. HOME CARE INSTRUCTIONS   Rest as needed.  Eat a well-balanced diet.  Try to get some exercise every day.  Only take over-the-counter or prescription medicines as directed by your caregiver. SEEK MEDICAL CARE IF:   Your weakness seems to be getting worse or spreads to other parts of your body.  You develop new aches or pains. SEEK IMMEDIATE MEDICAL CARE IF:   You cannot perform your normal daily activities, such as getting dressed and feeding yourself.  You cannot walk up and down stairs, or you feel exhausted when you do so.  You have shortness of breath or chest pain.  You have difficulty moving parts of your body.  You have weakness in only one area of the body or on only one side of the body.  You have a fever.  You have trouble speaking or swallowing.  You cannot control your bladder or bowel movements.  You have black or bloody vomit or stools. MAKE SURE YOU:  Understand these instructions.  Will watch your condition.  Will get help right away if you are not doing well or get worse.   This information is not intended to replace advice given to you by your health care provider. Make sure you discuss any questions you have with your health care provider.   Document Released: 01/24/2005 Document Revised: 07/26/2011 Document Reviewed: 03/25/2011 Elsevier Interactive Patient Education [employer].

## 2015-01-20 NOTE — ED Provider Notes (Signed)
CSN: [id number]     Arrival date & time 12/31/14  2115 History   First MD Initiated Contact with Patient 12/31/14 2141     Chief Complaint  Patient presents with  . Knee Pain   HPI   [name] is an 59 y.o. female with history of DM, polyneuropathy, HTN, HLD, anxiety, depression who presents to the ED for evaluation of left knee pain. She states at baseline she has numbness and pain in her legs bilaterally due to her diabetes. She states she was walking around earlier when her left knee buckled and she stumbled and almost fell. She states she is here because she is scared that something as wrong as she has been having more pain in her legs recently, particularly in her left knee. Denies falling and hitting her knee. States she was able to ambulate immediately following episode of knee buckling. States she has diffuse knee pain now. Denies radiation of the pain. She has not tried anything to help. Denies new weakness, numbness, or tingling.   Past Medical History  Diagnosis Date  . Diabetes mellitus type II, uncontrolled ([place])   . Hypertension   . Hyperlipidemia   . Ovarian cyst, left   . Anemia     due to menorrhagia, BL 8-10  . Vaginal cyst     nabothian and bartholin  . Anxiety   . Depression   . Postmenopausal bleeding 06/12/2008  . 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 UTI (lower urinary tract infection)   . IBS (irritable bowel syndrome)   . Chronic back pain   . Chronic abdominal pain   . DDD (degenerative disc disease), lumbar   . Diabetes mellitus without complication ([place])   . History of palpitations     evaluated recently 12'15  . Colitis   . CKD (chronic kidney disease) stage 3, GFR 30-59 ml/min     baseline creatinine 1.4-1.7  . Bilateral renal cysts 01/15/2009    Qualifier: Diagnosis of  By: Tyrell Antonio MD, Jerald Kief     Past Surgical History  Procedure Laterality Date  . Cardiac surgery      to repair congenital defect as a child- 21months old   . Esophagogastroduodenoscopy (egd) with propofol N/A 10/01/2014    Procedure: ESOPHAGOGASTRODUODENOSCOPY (EGD) WITH PROPOFOL;  Surgeon: Arta Silence, MD;  Location: [facility];  Service: Endoscopy;  Laterality: N/A;  . Colonoscopy with propofol N/A 10/01/2014    Procedure: COLONOSCOPY WITH PROPOFOL;  Surgeon: Arta Silence, MD;  Location: [facility];  Service: Endoscopy;  Laterality: N/A;   Family History  Problem Relation Age of Onset  . Stroke Father   . Heart attack Father     Had MI in his 31s  . Stomach cancer Paternal Grandmother   . Diabetes Maternal Grandmother   . Cerebral palsy Daughter   . Anesthesia problems Neg Hx   . Hypotension Neg Hx   . Malignant hyperthermia Neg Hx   . Pseudochol deficiency Neg Hx    Social History  Substance Use Topics  . Smoking status: Never Smoker   . Smokeless tobacco: Never Used  . Alcohol Use: No   OB History    Gravida Para Term Preterm AB TAB SAB Ectopic Multiple Living   3 1 1  2  2   1      Review of Systems  All other systems reviewed and are negative.     Allergies  Review of patient's allergies indicates no known  allergies.  Home Medications   Prior to Admission medications   Medication Sig Start Date End Date Taking? Authorizing Provider  acetaminophen (TYLENOL) 650 MG CR tablet Take 650 mg by mouth every 8 (eight) hours as needed for pain.   Yes Historical Provider, MD  enalapril (VASOTEC) 10 MG tablet Take 1 tablet (10 mg total) by mouth daily. 06/08/14  Yes Carly Montey Hora, MD  gabapentin (NEURONTIN) 100 MG capsule Take 100 mg by mouth 3 (three) times daily.   Yes Historical Provider, MD  Insulin Glargine (LANTUS SOLOSTAR) 100 UNIT/ML Solostar Pen Inject 22 Units into the skin daily at 10 pm. 06/10/14  Yes Carly Montey Hora, MD  metroNIDAZOLE (FLAGYL) 500 MG tablet Take 1 tablet (500 mg total) by mouth 2 (two) times daily. 12/31/14  Yes Billy Fischer, MD  pioglitazone (ACTOS) 15 MG tablet Take 2 tablets (30 mg total) by  mouth daily. 11/11/14  Yes Carly Montey Hora, MD  ciprofloxacin (CIPRO) 500 MG tablet Take 1 tablet (500 mg total) by mouth 2 (two) times daily. Patient not taking: Reported on 12/31/2014 12/10/14   Lysbeth Penner, FNP  diclofenac sodium (VOLTAREN) 1 % GEL Apply 4 g topically 4 (four) times daily. Patient not taking: Reported on 12/31/2014 12/28/14   Lysbeth Penner, FNP  meloxicam (MOBIC) 7.5 MG tablet Take 1 tablet (7.5 mg total) by mouth daily. 01/01/15   April Palumbo, MD  simvastatin (ZOCOR) 40 MG tablet TAKE 1 TABLET (40 MG TOTAL) BY MOUTH AT BEDTIME. Patient not taking: Reported on 12/31/2014 11/26/13   Ejiroghene Arlyce Dice, MD  tolterodine (DETROL LA) 4 MG 24 hr capsule Take 1 capsule (4 mg total) by mouth daily. 12/31/14   Billy Fischer, MD  traMADol (ULTRAM) 50 MG tablet Take 1 tablet (50 mg total) by mouth every 6 (six) hours as needed. Patient not taking: Reported on 12/31/2014 10/15/14 10/15/15  Norman Herrlich, MD   BP 132/67 mmHg  Pulse 88  Temp(Src) 98 F (36.7 C) (Oral)  Resp 17  SpO2 97% Physical Exam  Constitutional: She is oriented to person, place, and time. No distress.  HENT:  Right Ear: External ear normal.  Left Ear: External ear normal.  Nose: Nose normal.  Mouth/Throat: Oropharynx is clear and moist. No oropharyngeal exudate.  Eyes: Conjunctivae and EOM are normal. Pupils are equal, round, and reactive to light.  Neck: Normal range of motion. Neck supple.  Cardiovascular: Normal rate, regular rhythm, normal heart sounds and intact distal pulses.   Pulmonary/Chest: Effort normal and breath sounds normal. No respiratory distress.  Abdominal: Soft. Bowel sounds are normal. She exhibits no distension. There is no tenderness.  Musculoskeletal: Normal range of motion. She exhibits no edema.  Mild diffuse tenderness to left knee. No edema or erythema. FROM. Strength and sensation intact. Pt is able to ambulate unassisted with steady gait.  Neurological: She is alert and  oriented to person, place, and time. No cranial nerve deficit. Coordination and gait normal.  Skin: Skin is warm and dry. She is not diaphoretic.  Psychiatric: Her mood appears anxious.  Nursing note and vitals reviewed.   ED Course  Procedures (including critical care time) Labs Review Labs Reviewed - No data to display  Imaging Review No results found. I have personally reviewed and evaluated these images and lab results as part of my medical decision-making.   EKG Interpretation None      MDM   Final diagnoses:  Left knee pain  Primary osteoarthritis  of left knee    Pt has left knee XR from 11/20 that shows severe arthritic changes but no other acute abnormality. Pt did not fall onto her knee today. She has FROM, no edema, and is able to ambulate without assistance. I see no indication for further imaging at this time. She declines NSAIDs at this time. Will d/c home with reassurance and encouraged PCP f/u for chronic pain management. In the meantime discussed NSAID and RICE therapy. ER return precautions given.    Anne Ng, PA-C 01/20/15 [place], MD 01/27/15 [phone]

## 2015-01-20 NOTE — ED Provider Notes (Signed)
CSN: [id number]     Arrival date & time 01/20/15  1300 History   None    Chief Complaint  Patient presents with  . Urinary Tract Infection   (Consider location/radiation/quality/duration/timing/severity/associated sxs/prior Treatment) HPI Feeling tired and rundown over the last few days. Multiple complaints.  Thinks she may have UTI  Past Medical History  Diagnosis Date  . Diabetes mellitus type II, uncontrolled ([place])   . Hypertension   . Hyperlipidemia   . Ovarian cyst, left   . Anemia     due to menorrhagia, BL 8-10  . Vaginal cyst     nabothian and bartholin  . Anxiety   . Depression   . Postmenopausal bleeding 06/12/2008  . 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 UTI (lower urinary tract infection)   . IBS (irritable bowel syndrome)   . Chronic back pain   . Chronic abdominal pain   . DDD (degenerative disc disease), lumbar   . Diabetes mellitus without complication ([place])   . History of palpitations     evaluated recently 12'15  . Colitis   . CKD (chronic kidney disease) stage 3, GFR 30-59 ml/min     baseline creatinine 1.4-1.7  . Bilateral renal cysts 01/15/2009    Qualifier: Diagnosis of  By: Tyrell Antonio MD, Jerald Kief     Past Surgical History  Procedure Laterality Date  . Cardiac surgery      to repair congenital defect as a child- 35months old  . Esophagogastroduodenoscopy (egd) with propofol N/A 10/01/2014    Procedure: ESOPHAGOGASTRODUODENOSCOPY (EGD) WITH PROPOFOL;  Surgeon: Arta Silence, MD;  Location: [facility];  Service: Endoscopy;  Laterality: N/A;  . Colonoscopy with propofol N/A 10/01/2014    Procedure: COLONOSCOPY WITH PROPOFOL;  Surgeon: Arta Silence, MD;  Location: [facility];  Service: Endoscopy;  Laterality: N/A;   Family History  Problem Relation Age of Onset  . Stroke Father   . Heart attack Father     Had MI in his 41s  . Stomach cancer Paternal Grandmother   . Diabetes Maternal Grandmother   . Cerebral palsy  Daughter   . Anesthesia problems Neg Hx   . Hypotension Neg Hx   . Malignant hyperthermia Neg Hx   . Pseudochol deficiency Neg Hx    Social History  Substance Use Topics  . Smoking status: Never Smoker   . Smokeless tobacco: Never Used  . Alcohol Use: No   OB History    Gravida Para Term Preterm AB TAB SAB Ectopic Multiple Living   3 1 1  2  2   1      Review of Systems ROS +'ve tired and rundown  Denies: HEADACHE, NAUSEA, ABDOMINAL PAIN, CHEST PAIN, CONGESTION, DYSURIA, SHORTNESS OF BREATH  Allergies  Review of patient's allergies indicates no known allergies.  Home Medications   Prior to Admission medications   Medication Sig Start Date End Date Taking? Authorizing Provider  acetaminophen (TYLENOL) 650 MG CR tablet Take 650 mg by mouth every 8 (eight) hours as needed for pain.    Historical Provider, MD  ciprofloxacin (CIPRO) 500 MG tablet Take 1 tablet (500 mg total) by mouth 2 (two) times daily. Patient not taking: Reported on 12/31/2014 12/10/14   Lysbeth Penner, FNP  diclofenac sodium (VOLTAREN) 1 % GEL Apply 4 g topically 4 (four) times daily. Patient not taking: Reported on 12/31/2014 12/28/14   Lysbeth Penner, FNP  enalapril (VASOTEC) 10 MG tablet Take 1 tablet (10  mg total) by mouth daily. 06/08/14   Carly Montey Hora, MD  gabapentin (NEURONTIN) 100 MG capsule Take 100 mg by mouth 3 (three) times daily.    Historical Provider, MD  Insulin Glargine (LANTUS SOLOSTAR) 100 UNIT/ML Solostar Pen Inject 22 Units into the skin daily at 10 pm. 06/10/14   Juliet Rude, MD  meloxicam (MOBIC) 7.5 MG tablet Take 1 tablet (7.5 mg total) by mouth daily. 01/01/15   April Palumbo, MD  metroNIDAZOLE (FLAGYL) 500 MG tablet Take 1 tablet (500 mg total) by mouth 2 (two) times daily. 12/31/14   Billy Fischer, MD  pioglitazone (ACTOS) 15 MG tablet Take 2 tablets (30 mg total) by mouth daily. 11/11/14   Juliet Rude, MD  simvastatin (ZOCOR) 40 MG tablet TAKE 1 TABLET (40 MG TOTAL) BY MOUTH AT  BEDTIME. Patient not taking: Reported on 12/31/2014 11/26/13   Ejiroghene Arlyce Dice, MD  tolterodine (DETROL LA) 4 MG 24 hr capsule Take 1 capsule (4 mg total) by mouth daily. 12/31/14   Billy Fischer, MD  traMADol (ULTRAM) 50 MG tablet Take 1 tablet (50 mg total) by mouth every 6 (six) hours as needed. Patient not taking: Reported on 12/31/2014 10/15/14 10/15/15  Norman Herrlich, MD   Meds Ordered and Administered this Visit  Medications - No data to display  BP 130/76 mmHg  Pulse 78  Temp(Src) 98.6 F (37 C) (Oral)  Resp 20  SpO2 96% No data found.   Physical Exam  Constitutional: She appears well-developed and well-nourished.  HENT:  Head: Normocephalic and atraumatic.  Eyes: Conjunctivae are normal.  Neck: Normal range of motion. Neck supple.  Pulmonary/Chest: Effort normal.  Musculoskeletal: Normal range of motion.  Neurological: She is alert.  Skin: Skin is warm and dry.  Nursing note and vitals reviewed.   ED Course  Procedures (including critical care time)  Labs Review Labs Reviewed  POCT URINALYSIS DIP (DEVICE) - Abnormal; Notable for the following:    Leukocytes, UA TRACE (*)    All other components within normal limits    Imaging Review No results found.   Visual Acuity Review  Right Eye Distance:   Left Eye Distance:   Bilateral Distance:    Right Eye Near:   Left Eye Near:    Bilateral Near:         MDM   1. Chronic fatigue    Patient is advised that a urinalysis is absolutely normal at this time. When given this information patient starts complaining of multiple other issues including back pain fatigue knee pain body aches. I have advised patient that degree of her complaints is beyond the ability of urgent care to care for at this time and she should seek all up with her primary care provider who is able to do testing if needed. I have advised patient to go home and get some rest this plenty of fluids. Patient asked about getting Flagyl for an  all site infection, I have advised patient it is not in her best interest to take antibiotics for no specific reason. That this can do more harm than good.  THIS NOTE WAS GENERATED USING A VOICE RECOGNITION SOFTWARE PROGRAM. ALL REASONABLE EFFORTS  WERE MADE TO PROOFREAD THIS DOCUMENT FOR ACCURACY.     Konrad Felix, PA 01/20/15 [phone]

## 2015-01-21 ENCOUNTER — Encounter: Payer: Self-pay | Admitting: Dietician

## 2015-01-24 ENCOUNTER — Emergency Department (HOSPITAL_COMMUNITY)
Admission: EM | Admit: 2015-01-24 | Discharge: 2015-01-24 | Disposition: A | Discharge status: Home / Self Care | Payer: Self-pay | Attending: Emergency Medicine | Admitting: Emergency Medicine

## 2015-01-24 ENCOUNTER — Encounter (HOSPITAL_COMMUNITY): Payer: Self-pay | Admitting: *Deleted

## 2015-01-24 DIAGNOSIS — G8929 Other chronic pain: Secondary | ICD-10-CM | POA: Insufficient documentation

## 2015-01-24 DIAGNOSIS — Z862 Personal history of diseases of the blood and blood-forming organs and certain disorders involving the immune mechanism: Secondary | ICD-10-CM | POA: Insufficient documentation

## 2015-01-24 DIAGNOSIS — Z791 Long term (current) use of non-steroidal anti-inflammatories (NSAID): Secondary | ICD-10-CM | POA: Insufficient documentation

## 2015-01-24 DIAGNOSIS — Q6102 Congenital multiple renal cysts: Secondary | ICD-10-CM | POA: Insufficient documentation

## 2015-01-24 DIAGNOSIS — I129 Hypertensive chronic kidney disease with stage 1 through stage 4 chronic kidney disease, or unspecified chronic kidney disease: Secondary | ICD-10-CM | POA: Insufficient documentation

## 2015-01-24 DIAGNOSIS — Z87440 Personal history of urinary (tract) infections: Secondary | ICD-10-CM | POA: Insufficient documentation

## 2015-01-24 DIAGNOSIS — Z8719 Personal history of other diseases of the digestive system: Secondary | ICD-10-CM | POA: Insufficient documentation

## 2015-01-24 DIAGNOSIS — Z9889 Other specified postprocedural states: Secondary | ICD-10-CM | POA: Insufficient documentation

## 2015-01-24 DIAGNOSIS — F419 Anxiety disorder, unspecified: Secondary | ICD-10-CM | POA: Insufficient documentation

## 2015-01-24 DIAGNOSIS — Z794 Long term (current) use of insulin: Secondary | ICD-10-CM | POA: Insufficient documentation

## 2015-01-24 DIAGNOSIS — Z87828 Personal history of other (healed) physical injury and trauma: Secondary | ICD-10-CM | POA: Insufficient documentation

## 2015-01-24 DIAGNOSIS — Z79899 Other long term (current) drug therapy: Secondary | ICD-10-CM | POA: Insufficient documentation

## 2015-01-24 DIAGNOSIS — E785 Hyperlipidemia, unspecified: Secondary | ICD-10-CM | POA: Insufficient documentation

## 2015-01-24 DIAGNOSIS — N183 Chronic kidney disease, stage 3 (moderate): Secondary | ICD-10-CM | POA: Insufficient documentation

## 2015-01-24 DIAGNOSIS — Z8742 Personal history of other diseases of the female genital tract: Secondary | ICD-10-CM | POA: Insufficient documentation

## 2015-01-24 DIAGNOSIS — Z792 Long term (current) use of antibiotics: Secondary | ICD-10-CM | POA: Insufficient documentation

## 2015-01-24 DIAGNOSIS — E119 Type 2 diabetes mellitus without complications: Secondary | ICD-10-CM | POA: Insufficient documentation

## 2015-01-24 DIAGNOSIS — M25562 Pain in left knee: Secondary | ICD-10-CM | POA: Insufficient documentation

## 2015-01-24 MED ORDER — ACETAMINOPHEN 325 MG PO TABS: 650.0000 mg | ORAL_TABLET | Freq: Once | ORAL | Status: DC

## 2015-01-24 NOTE — ED Provider Notes (Signed)
CSN: [id number]     Arrival date & time 01/24/15  1818 History  By signing my name below, I, Meriel Pica, attest that this documentation has been prepared under the direction and in the presence of [employer], PA-C. Electronically Signed: Meriel Pica, ED Scribe. 01/24/2015. 7:30 PM.   Chief Complaint  Patient presents with  . Knee Pain   The history is provided by the patient. No language interpreter was used.   HPI Comments: [name] is a 59 y.o. female, with a PMhx osteoarthritis in left knee, who presents to the Emergency Department complaining of intermittent, moderate, non-radiating, anterior left knee pain that has been present s/p fall that occurred 3 weeks ago, but worsened today without obvious cause or increased ambulation. The pt was seen in the ED following the fall 3 weeks ago when she was referred to Dr. Lorin Mercy with [facility] for osteoarthritis. Pt states she had fluid drawn off the knee recently. She has been applying ice sporadically and wearing a left knee sleeve that is too large without significant relief. Pt has also been taking tylenol sporadically with moderate relief. Her pain is worse with movement and manipulation of left knee. Denies numbness, tingling, or difficulty ambulating. Pt not taking blood thinning medication. No overlying skin changes.   Past Medical History  Diagnosis Date  . Diabetes mellitus type II, uncontrolled ([place])   . Hypertension   . Hyperlipidemia   . Ovarian cyst, left   . Anemia     due to menorrhagia, BL 8-10  . Vaginal cyst     nabothian and bartholin  . Anxiety   . Depression   . Postmenopausal bleeding 06/12/2008  . 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 UTI (lower urinary tract infection)   . IBS (irritable bowel syndrome)   . Chronic back pain   . Chronic abdominal pain   . DDD (degenerative disc disease), lumbar   . Diabetes mellitus without complication ([place])   . History of  palpitations     evaluated recently 12'15  . Colitis   . CKD (chronic kidney disease) stage 3, GFR 30-59 ml/min     baseline creatinine 1.4-1.7  . Bilateral renal cysts 01/15/2009    Qualifier: Diagnosis of  By: Tyrell Antonio MD, Jerald Kief     Past Surgical History  Procedure Laterality Date  . Cardiac surgery      to repair congenital defect as a child- 16months old  . Esophagogastroduodenoscopy (egd) with propofol N/A 10/01/2014    Procedure: ESOPHAGOGASTRODUODENOSCOPY (EGD) WITH PROPOFOL;  Surgeon: Arta Silence, MD;  Location: [facility];  Service: Endoscopy;  Laterality: N/A;  . Colonoscopy with propofol N/A 10/01/2014    Procedure: COLONOSCOPY WITH PROPOFOL;  Surgeon: Arta Silence, MD;  Location: [facility];  Service: Endoscopy;  Laterality: N/A;   Family History  Problem Relation Age of Onset  . Stroke Father   . Heart attack Father     Had MI in his 3s  . Stomach cancer Paternal Grandmother   . Diabetes Maternal Grandmother   . Cerebral palsy Daughter   . Anesthesia problems Neg Hx   . Hypotension Neg Hx   . Malignant hyperthermia Neg Hx   . Pseudochol deficiency Neg Hx    Social History  Substance Use Topics  . Smoking status: Never Smoker   . Smokeless tobacco: Never Used  . Alcohol Use: No   OB History    Gravida Para Term Preterm AB TAB SAB  Ectopic Multiple Living   3 1 1  2  2   1      Review of Systems  Musculoskeletal: Positive for arthralgias ( left knee). Negative for joint swelling and gait problem.  Skin: Negative for color change and wound.  Neurological: Negative for weakness and numbness.  Hematological: Does not bruise/bleed easily.  All other systems reviewed and are negative.  Allergies  Review of patient's allergies indicates no known allergies.  Home Medications   Prior to Admission medications   Medication Sig Start Date End Date Taking? Authorizing Provider  acetaminophen (TYLENOL) 650 MG CR tablet Take 650 mg by mouth every 8 (eight)  hours as needed for pain.    Historical Provider, MD  ciprofloxacin (CIPRO) 500 MG tablet Take 1 tablet (500 mg total) by mouth 2 (two) times daily. Patient not taking: Reported on 12/31/2014 12/10/14   Lysbeth Penner, FNP  diclofenac sodium (VOLTAREN) 1 % GEL Apply 4 g topically 4 (four) times daily. Patient not taking: Reported on 12/31/2014 12/28/14   Lysbeth Penner, FNP  enalapril (VASOTEC) 10 MG tablet Take 1 tablet (10 mg total) by mouth daily. 06/08/14   Carly Montey Hora, MD  gabapentin (NEURONTIN) 100 MG capsule Take 100 mg by mouth 3 (three) times daily.    Historical Provider, MD  Insulin Glargine (LANTUS SOLOSTAR) 100 UNIT/ML Solostar Pen Inject 22 Units into the skin daily at 10 pm. 06/10/14   Juliet Rude, MD  meloxicam (MOBIC) 7.5 MG tablet Take 1 tablet (7.5 mg total) by mouth daily. 01/01/15   April Palumbo, MD  metroNIDAZOLE (FLAGYL) 500 MG tablet Take 1 tablet (500 mg total) by mouth 2 (two) times daily. 12/31/14   Billy Fischer, MD  pioglitazone (ACTOS) 15 MG tablet Take 2 tablets (30 mg total) by mouth daily. 11/11/14   Juliet Rude, MD  simvastatin (ZOCOR) 40 MG tablet TAKE 1 TABLET (40 MG TOTAL) BY MOUTH AT BEDTIME. Patient not taking: Reported on 12/31/2014 11/26/13   Ejiroghene Arlyce Dice, MD  tolterodine (DETROL LA) 4 MG 24 hr capsule Take 1 capsule (4 mg total) by mouth daily. 12/31/14   Billy Fischer, MD  traMADol (ULTRAM) 50 MG tablet Take 1 tablet (50 mg total) by mouth every 6 (six) hours as needed. Patient not taking: Reported on 12/31/2014 10/15/14 10/15/15  Norman Herrlich, MD   BP 131/87 mmHg  Pulse 89  Temp(Src) 98.4 F (36.9 C) (Oral)  Resp 20  SpO2 97% Physical Exam  Constitutional: She is oriented to person, place, and time. She appears well-developed and well-nourished. No distress.  HENT:  Head: Normocephalic.  Eyes: Conjunctivae are normal.  Neck: Normal range of motion. Neck supple.  Cardiovascular: Normal rate, regular rhythm and normal heart sounds.    Pulmonary/Chest: Effort normal. No respiratory distress.  Musculoskeletal: Normal range of motion.  Right knee: No focal bony tenderness. No edema, other appreciable swelling or erythema. No overt warmth. Maintains full active range of motion. No ligamentous laxity. Distal pulses are intact.  Neurological: She is alert and oriented to person, place, and time. Coordination normal.  Skin: Skin is warm.  Psychiatric: She has a normal mood and affect. Her behavior is normal.  Nursing note and vitals reviewed.   ED Course  Procedures  DIAGNOSTIC STUDIES: Oxygen Saturation is 97% on RA, normal by my interpretation.    COORDINATION OF CARE: 6:55 PM Discussed treatment plan which includes to order a new knee sleeve as pt's current sleeve  does not fit properly. Discussed with pt to apply ice daily and follow up with her orthopedist Dr. Lorin Mercy. Pt acknowledges and agrees to plan.   Meds given in ED:  Medications  acetaminophen (TYLENOL) tablet 650 mg (650 mg Oral Not Given 01/24/15 1917)    Discharge Medication List as of 01/24/2015  7:06 PM     Filed Vitals:   01/24/15 1825  BP: 131/87  Pulse: 89  Temp: 98.4 F (36.9 C)  TempSrc: Oral  Resp: 20  SpO2: 97%    MDM   Final diagnoses:  Knee pain, left   Arthritis  Pt present with aching left knee pain & joint stiffness worsened in the morning. On exam no focal bony tenderness, erythema or swelling in maintains full active range of motion. No current medical concern for septic arthritis or joint as pt is afebrile & without warmth of the affected area. Doubt hemarthrosis or tibial plateau fracture. Presentation c/w dx of OA. Pain managed in ED. Given new knee sleeve. Pt advised to follow up with her orthopedist Dr. Lorin Mercy if symptoms persist for further evaluation if conservative therapies do not work. Recommended PT, exercise, ice therapy, and tylenol.  Return precaurtions discussed. Patient will be dc home & is agreeable with above  plan.  I personally performed the services described in this documentation, which was scribed in my presence. The recorded information has been reviewed and is accurate.    Comer Locket, PA-C 01/24/15 [place], MD 01/25/15 [phone]

## 2015-01-24 NOTE — Discharge Instructions (Signed)
There does not appear to be an emergent cause for your symptoms at this time. Use your knee brace as we discussed. Also use Tylenol and ice. You may follow-up with your doctor/surgeon for reevaluation next week. Return to ED for any new or worsening symptoms.  How to Use a Knee Brace A knee brace is a device that you wear to support your knee, especially if the knee is healing after an injury or surgery. There are several types of knee braces. Some are designed to prevent an injury (prophylactic brace). These are often worn during sports. Others support an injured knee (functional brace) or keep it still while it heals (rehabilitative brace). People with severe arthritis of the knee may benefit from a brace that takes some pressure off the knee (unloader brace). Most knee braces are made from a combination of cloth and metal or plastic.  You may need to wear a knee brace to:  Relieve knee pain.  Help your knee support your weight (improve stability).  Help you walk farther (improve mobility).  Prevent injury.  Support your knee while it heals from surgery or from an injury. RISKS AND COMPLICATIONS Generally, knee braces are very safe to wear. However, problems may occur, including:  Skin irritation that may lead to infection.  Making your condition worse if you wear the brace in the wrong way. HOW TO USE A KNEE BRACE Different braces will have different instructions for use. Your health care provider will tell you or show you:  How to put on your brace.  How to adjust the brace.  When and how often to wear the brace.  How to remove the brace.  If you will need any assistive devices in addition to the brace, such as crutches or a cane. In general, your brace should:  Have the hinge of the brace line up with the bend of your knee.  Have straps, hooks, or tapes that fasten snugly around your leg.  Not feel too tight or too loose. HOW TO CARE FOR A KNEE BRACE  Check your brace  often for signs of damage, such as loose connections or attachments. Your knee brace may get damaged or wear out during normal use.  Wash the fabric parts of your brace with soap and water.  Read the insert that comes with your brace for other specific care instructions. SEEK MEDICAL CARE IF:  Your knee brace is too loose or too tight and you cannot adjust it.  Your knee brace causes skin redness, swelling, bruising, or irritation.  Your knee brace is not helping.  Your knee brace is making your knee pain worse.   This information is not intended to replace advice given to you by your health care provider. Make sure you discuss any questions you have with your health care provider.   Document Released: 04/16/2003 Document Revised: 10/15/2014 Document Reviewed: 05/19/2014 Elsevier Interactive Patient Education 2016 [place] [employer] Joint pain, which is also called arthralgia, can be caused by many things. Joint pain often goes away when you follow your health care provider's instructions for relieving pain at home. However, joint pain can also be caused by conditions that require further treatment. Common causes of joint pain include:  Bruising in the area of the joint.  Overuse of the joint.  Wear and tear on the joints that occur with aging (osteoarthritis).  Various other forms of arthritis.  A buildup of a crystal form of uric acid in the joint (gout).  Infections of the joint (septic arthritis) or of the bone (osteomyelitis). Your health care provider may recommend medicine to help with the pain. If your joint pain continues, additional tests may be needed to diagnose your condition. HOME CARE INSTRUCTIONS Watch your condition for any changes. Follow these instructions as directed to lessen the pain that you are feeling.  Take medicines only as directed by your health care provider.  Rest the affected area for as long as your health care provider says that you  should. If directed to do so, raise the painful joint above the level of your heart while you are sitting or lying down.  Do not do things that cause or worsen pain.  If directed, apply ice to the painful area:  Put ice in a plastic bag.  Place a towel between your skin and the bag.  Leave the ice on for 20 minutes, 2-3 times per day.  Wear an elastic bandage, splint, or sling as directed by your health care provider. Loosen the elastic bandage or splint if your fingers or toes become numb and tingle, or if they turn cold and blue.  Begin exercising or stretching the affected area as directed by your health care provider. Ask your health care provider what types of exercise are safe for you.  Keep all follow-up visits as directed by your health care provider. This is important. SEEK MEDICAL CARE IF:  Your pain increases, and medicine does not help.  Your joint pain does not improve within 3 days.  You have increased bruising or swelling.  You have a fever.  You lose 10 lb (4.5 kg) or more without trying. SEEK IMMEDIATE MEDICAL CARE IF:  You are not able to move the joint.  Your fingers or toes become numb or they turn cold and blue.   This information is not intended to replace advice given to you by your health care provider. Make sure you discuss any questions you have with your health care provider.   Document Released: 01/24/2005 Document Revised: 02/14/2014 Document Reviewed: 11/05/2013 Elsevier Interactive Patient Education [employer].

## 2015-01-24 NOTE — ED Notes (Signed)
Pt and daughter taken to lobby in wheelchairs.  Security called to provide escort to car so patient did not have to walk.  Patient continued to state during discharge that "no one care about me".  This RN apologized and explained to patient that we do care about her.  Patient tearful, stating she cannot walk.  This RN observed patient walking to wheelchair.  Gait steady, but patient limping.  Patient states we did not do enough for her.  This RN explained need for follow-up and limitations of emergency medicine.  Patient not happy with this RN's attempts.

## 2015-01-24 NOTE — ED Notes (Signed)
The  Pt is c/o lt knee nov before thanksgiving when it just gave way.  The pain is intermittent

## 2015-02-04 ENCOUNTER — Encounter (HOSPITAL_COMMUNITY): Payer: Self-pay | Admitting: Emergency Medicine

## 2015-02-04 ENCOUNTER — Emergency Department (INDEPENDENT_AMBULATORY_CARE_PROVIDER_SITE_OTHER)
Admission: EM | Admit: 2015-02-04 | Discharge: 2015-02-04 | Disposition: A | Discharge status: Home / Self Care | Payer: Self-pay | Source: Home / Self Care | Attending: Emergency Medicine | Admitting: Emergency Medicine

## 2015-02-04 ENCOUNTER — Other Ambulatory Visit (HOSPITAL_COMMUNITY)
Admission: RE | Admit: 2015-02-04 | Discharge: 2015-02-04 | Disposition: A | Discharge status: Home / Self Care | Payer: Self-pay | Source: Ambulatory Visit | Attending: Emergency Medicine | Admitting: Emergency Medicine

## 2015-02-04 DIAGNOSIS — R350 Frequency of micturition: Secondary | ICD-10-CM

## 2015-02-04 LAB — POCT URINALYSIS DIP (DEVICE)
Bilirubin Urine: NEGATIVE
GLUCOSE, UA: 250 mg/dL — AB
Hgb urine dipstick: NEGATIVE
Ketones, ur: NEGATIVE mg/dL
LEUKOCYTES UA: NEGATIVE
NITRITE: NEGATIVE
PROTEIN: NEGATIVE mg/dL
SPECIFIC GRAVITY, URINE: 1.015 (ref 1.005–1.030)
UROBILINOGEN UA: 0.2 mg/dL (ref 0.0–1.0)
pH: 5.5 (ref 5.0–8.0)

## 2015-02-04 NOTE — Discharge Instructions (Signed)
Your urine is clear, except for a small amount of sugar. I have sent for culture. We will call you if you need to start antibiotics. Follow-up as needed.

## 2015-02-04 NOTE — ED Provider Notes (Signed)
CSN: [id number]     Arrival date & time 02/04/15  1630 History   First MD Initiated Contact with Patient 02/04/15 1802     Chief Complaint  Patient presents with  . Urinary Tract Infection   (Consider location/radiation/quality/duration/timing/severity/associated sxs/prior Treatment) HPI  She is a 59 year old woman here for evaluation of urinary frequency. She has a history of frequent UTIs. She states she started having urinary frequency with urgency over the last few days. She denies any dysuria, hematuria, abdominal pain, flank pain. She does report some mild lower back discomfort, but states that is not unusual. Fevers or chills. Her daughter wanted her to come in and get checked for a urinary tract infection.  Past Medical History  Diagnosis Date  . Diabetes mellitus type II, uncontrolled ([place])   . Hypertension   . Hyperlipidemia   . Ovarian cyst, left   . Anemia     due to menorrhagia, BL 8-10  . Vaginal cyst     nabothian and bartholin  . Anxiety   . Depression   . Postmenopausal bleeding 06/12/2008  . 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 UTI (lower urinary tract infection)   . IBS (irritable bowel syndrome)   . Chronic back pain   . Chronic abdominal pain   . DDD (degenerative disc disease), lumbar   . Diabetes mellitus without complication ([place])   . History of palpitations     evaluated recently 12'15  . Colitis   . CKD (chronic kidney disease) stage 3, GFR 30-59 ml/min     baseline creatinine 1.4-1.7  . Bilateral renal cysts 01/15/2009    Qualifier: Diagnosis of  By: Tyrell Antonio MD, Jerald Kief     Past Surgical History  Procedure Laterality Date  . Cardiac surgery      to repair congenital defect as a child- 69months old  . Esophagogastroduodenoscopy (egd) with propofol N/A 10/01/2014    Procedure: ESOPHAGOGASTRODUODENOSCOPY (EGD) WITH PROPOFOL;  Surgeon: Arta Silence, MD;  Location: [facility];  Service: Endoscopy;  Laterality: N/A;  .  Colonoscopy with propofol N/A 10/01/2014    Procedure: COLONOSCOPY WITH PROPOFOL;  Surgeon: Arta Silence, MD;  Location: [facility];  Service: Endoscopy;  Laterality: N/A;   Family History  Problem Relation Age of Onset  . Stroke Father   . Heart attack Father     Had MI in his 30s  . Stomach cancer Paternal Grandmother   . Diabetes Maternal Grandmother   . Cerebral palsy Daughter   . Anesthesia problems Neg Hx   . Hypotension Neg Hx   . Malignant hyperthermia Neg Hx   . Pseudochol deficiency Neg Hx    Social History  Substance Use Topics  . Smoking status: Never Smoker   . Smokeless tobacco: Never Used  . Alcohol Use: No   OB History    Gravida Para Term Preterm AB TAB SAB Ectopic Multiple Living   3 1 1  2  2   1      Review of Systems As in history of present illness Allergies  Review of patient's allergies indicates no known allergies.  Home Medications   Prior to Admission medications   Medication Sig Start Date End Date Taking? Authorizing Provider  acetaminophen (TYLENOL) 650 MG CR tablet Take 650 mg by mouth every 8 (eight) hours as needed for pain.    Historical Provider, MD  ciprofloxacin (CIPRO) 500 MG tablet Take 1 tablet (500 mg total) by mouth 2 (two) times  daily. Patient not taking: Reported on 12/31/2014 12/10/14   Lysbeth Penner, FNP  diclofenac sodium (VOLTAREN) 1 % GEL Apply 4 g topically 4 (four) times daily. Patient not taking: Reported on 12/31/2014 12/28/14   Lysbeth Penner, FNP  enalapril (VASOTEC) 10 MG tablet Take 1 tablet (10 mg total) by mouth daily. 06/08/14   Carly Montey Hora, MD  gabapentin (NEURONTIN) 100 MG capsule Take 100 mg by mouth 3 (three) times daily.    Historical Provider, MD  Insulin Glargine (LANTUS SOLOSTAR) 100 UNIT/ML Solostar Pen Inject 22 Units into the skin daily at 10 pm. 06/10/14   Juliet Rude, MD  meloxicam (MOBIC) 7.5 MG tablet Take 1 tablet (7.5 mg total) by mouth daily. 01/01/15   April Palumbo, MD  metroNIDAZOLE  (FLAGYL) 500 MG tablet Take 1 tablet (500 mg total) by mouth 2 (two) times daily. 12/31/14   Billy Fischer, MD  pioglitazone (ACTOS) 15 MG tablet Take 2 tablets (30 mg total) by mouth daily. 11/11/14   Juliet Rude, MD  simvastatin (ZOCOR) 40 MG tablet TAKE 1 TABLET (40 MG TOTAL) BY MOUTH AT BEDTIME. Patient not taking: Reported on 12/31/2014 11/26/13   Ejiroghene Arlyce Dice, MD  tolterodine (DETROL LA) 4 MG 24 hr capsule Take 1 capsule (4 mg total) by mouth daily. 12/31/14   Billy Fischer, MD  traMADol (ULTRAM) 50 MG tablet Take 1 tablet (50 mg total) by mouth every 6 (six) hours as needed. Patient not taking: Reported on 12/31/2014 10/15/14 10/15/15  Norman Herrlich, MD   Meds Ordered and Administered this Visit  Medications - No data to display  BP 149/80 mmHg  Pulse 83  Temp(Src) 98.2 F (36.8 C) (Oral)  Resp 16  SpO2 98% No data found.   Physical Exam  Constitutional: She is oriented to person, place, and time. She appears well-developed and well-nourished. No distress.  Cardiovascular: Normal rate.   Pulmonary/Chest: Effort normal.  Neurological: She is alert and oriented to person, place, and time.    ED Course  Procedures (including critical care time)  Labs Review Labs Reviewed  POCT URINALYSIS DIP (DEVICE) - Abnormal; Notable for the following:    Glucose, UA 250 (*)    All other components within normal limits  URINE CULTURE    Imaging Review No results found.    MDM   1. Urinary frequency    UA clear except for some glucose. I sent her urine for culture. We will call if she needs to start antibiotics. Patient agrees with plan.    Melony Overly, MD 02/04/15 [phone]

## 2015-02-04 NOTE — ED Notes (Signed)
Here with recurrent UTI sx's that started 3 days ago  Denies pressure or burning with urination  Freq/ urge noted

## 2015-02-06 LAB — URINE CULTURE

## 2015-02-18 ENCOUNTER — Ambulatory Visit: Payer: Self-pay

## 2015-03-07 ENCOUNTER — Encounter: Payer: Self-pay | Admitting: Internal Medicine

## 2015-03-08 ENCOUNTER — Encounter (HOSPITAL_COMMUNITY): Payer: Self-pay | Admitting: Emergency Medicine

## 2015-03-08 ENCOUNTER — Emergency Department (INDEPENDENT_AMBULATORY_CARE_PROVIDER_SITE_OTHER)
Admission: EM | Admit: 2015-03-08 | Discharge: 2015-03-08 | Disposition: A | Discharge status: Home / Self Care | Payer: Self-pay | Source: Home / Self Care | Attending: Emergency Medicine | Admitting: Emergency Medicine

## 2015-03-08 ENCOUNTER — Other Ambulatory Visit (HOSPITAL_COMMUNITY)
Admission: RE | Admit: 2015-03-08 | Discharge: 2015-03-08 | Disposition: A | Discharge status: Home / Self Care | Payer: Self-pay | Source: Ambulatory Visit | Attending: Emergency Medicine | Admitting: Emergency Medicine

## 2015-03-08 DIAGNOSIS — G478 Other sleep disorders: Secondary | ICD-10-CM

## 2015-03-08 DIAGNOSIS — R5383 Other fatigue: Secondary | ICD-10-CM | POA: Insufficient documentation

## 2015-03-08 DIAGNOSIS — G479 Sleep disorder, unspecified: Secondary | ICD-10-CM | POA: Insufficient documentation

## 2015-03-08 LAB — POCT URINALYSIS DIP (DEVICE)
BILIRUBIN URINE: NEGATIVE
Glucose, UA: NEGATIVE mg/dL
Hgb urine dipstick: NEGATIVE
KETONES UR: NEGATIVE mg/dL
NITRITE: NEGATIVE
PH: 7 (ref 5.0–8.0)
Protein, ur: NEGATIVE mg/dL
SPECIFIC GRAVITY, URINE: 1.015 (ref 1.005–1.030)
UROBILINOGEN UA: 0.2 mg/dL (ref 0.0–1.0)

## 2015-03-08 LAB — POCT I-STAT, CHEM 8
BUN: 36 mg/dL — ABNORMAL HIGH (ref 6–20)
CALCIUM ION: 1.22 mmol/L (ref 1.12–1.23)
Chloride: 99 mmol/L — ABNORMAL LOW (ref 101–111)
Creatinine, Ser: 1.4 mg/dL — ABNORMAL HIGH (ref 0.44–1.00)
GLUCOSE: 182 mg/dL — AB (ref 65–99)
HCT: 40 % (ref 36.0–46.0)
HEMOGLOBIN: 13.6 g/dL (ref 12.0–15.0)
POTASSIUM: 4.7 mmol/L (ref 3.5–5.1)
Sodium: 139 mmol/L (ref 135–145)
TCO2: 31 mmol/L (ref 0–100)

## 2015-03-08 NOTE — Discharge Instructions (Signed)
Your blood work and urine looked good today. I did send your urine for culture just to be safe. We will call you if anything is positive. When you see your doctor next month, please let them know about your trouble sleeping. You can take the Tylenol arthritis 3 times a day. Follow-up as needed.

## 2015-03-08 NOTE — ED Notes (Signed)
Pt appears comfortable, iv fluids infusing without diff,  Will cont to monitor

## 2015-03-08 NOTE — ED Provider Notes (Signed)
CSN: [id number]     Arrival date & time 03/08/15  1311 History   First MD Initiated Contact with Patient 03/08/15 1345     Chief Complaint  Patient presents with  . Urinary Tract Infection   (Consider location/radiation/quality/duration/timing/severity/associated sxs/prior Treatment) HPI  She is a 60 year old woman here for evaluation of possible urinary tract infection. She states she has had decreased energy and just not felt quite right for the last day or 2. She does also have multiple other complaints, mostly muscular skeletal including shoulder pain, elbow pain, and knee pain. She denies any abdominal pain or dysuria. No change in her urine. She does report a feeling of warmth in her lower abdomen at times. She states she had an ovarian cyst diagnosed in the last few months. She does have a follow-up with her primary care doctor in about 2 weeks.  She does also report increased stress with taking care of her daughter who is currently in a cast. She reports poor sleep due to having to help her daughter use a bedside commode or bed pan.  She did take her blood pressure medicine this morning.  Past Medical History  Diagnosis Date  . Diabetes mellitus type II, uncontrolled ([place])   . Hypertension   . Hyperlipidemia   . Ovarian cyst, left   . Anemia     due to menorrhagia, BL 8-10  . Vaginal cyst     nabothian and bartholin  . Anxiety   . Depression   . Postmenopausal bleeding 06/12/2008  . 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 UTI (lower urinary tract infection)   . IBS (irritable bowel syndrome)   . Chronic back pain   . Chronic abdominal pain   . DDD (degenerative disc disease), lumbar   . Diabetes mellitus without complication ([place])   . History of palpitations     evaluated recently 12'15  . Colitis   . CKD (chronic kidney disease) stage 3, GFR 30-59 ml/min     baseline creatinine 1.4-1.7  . Bilateral renal cysts 01/15/2009    Qualifier: Diagnosis of   By: Tyrell Antonio MD, Jerald Kief     Past Surgical History  Procedure Laterality Date  . Cardiac surgery      to repair congenital defect as a child- 71months old  . Esophagogastroduodenoscopy (egd) with propofol N/A 10/01/2014    Procedure: ESOPHAGOGASTRODUODENOSCOPY (EGD) WITH PROPOFOL;  Surgeon: Arta Silence, MD;  Location: [facility];  Service: Endoscopy;  Laterality: N/A;  . Colonoscopy with propofol N/A 10/01/2014    Procedure: COLONOSCOPY WITH PROPOFOL;  Surgeon: Arta Silence, MD;  Location: [facility];  Service: Endoscopy;  Laterality: N/A;   Family History  Problem Relation Age of Onset  . Stroke Father   . Heart attack Father     Had MI in his 38s  . Stomach cancer Paternal Grandmother   . Diabetes Maternal Grandmother   . Cerebral palsy Daughter   . Anesthesia problems Neg Hx   . Hypotension Neg Hx   . Malignant hyperthermia Neg Hx   . Pseudochol deficiency Neg Hx    Social History  Substance Use Topics  . Smoking status: Never Smoker   . Smokeless tobacco: Never Used  . Alcohol Use: No   OB History    Gravida Para Term Preterm AB TAB SAB Ectopic Multiple Living   3 1 1  2  2   1      Review of Systems As in history  of present illness Allergies  Review of patient's allergies indicates no known allergies.  Home Medications   Prior to Admission medications   Medication Sig Start Date End Date Taking? Authorizing Provider  acetaminophen (TYLENOL) 650 MG CR tablet Take 650 mg by mouth every 8 (eight) hours as needed for pain.    Historical Provider, MD  ciprofloxacin (CIPRO) 500 MG tablet Take 1 tablet (500 mg total) by mouth 2 (two) times daily. Patient not taking: Reported on 12/31/2014 12/10/14   Lysbeth Penner, FNP  diclofenac sodium (VOLTAREN) 1 % GEL Apply 4 g topically 4 (four) times daily. Patient not taking: Reported on 12/31/2014 12/28/14   Lysbeth Penner, FNP  enalapril (VASOTEC) 10 MG tablet Take 1 tablet (10 mg total) by mouth daily. 06/08/14   Carly Montey Hora, MD  gabapentin (NEURONTIN) 100 MG capsule Take 100 mg by mouth 3 (three) times daily.    Historical Provider, MD  Insulin Glargine (LANTUS SOLOSTAR) 100 UNIT/ML Solostar Pen Inject 22 Units into the skin daily at 10 pm. 06/10/14   Juliet Rude, MD  meloxicam (MOBIC) 7.5 MG tablet Take 1 tablet (7.5 mg total) by mouth daily. 01/01/15   April Palumbo, MD  metroNIDAZOLE (FLAGYL) 500 MG tablet Take 1 tablet (500 mg total) by mouth 2 (two) times daily. 12/31/14   Billy Fischer, MD  pioglitazone (ACTOS) 15 MG tablet Take 2 tablets (30 mg total) by mouth daily. 11/11/14   Juliet Rude, MD  simvastatin (ZOCOR) 40 MG tablet TAKE 1 TABLET (40 MG TOTAL) BY MOUTH AT BEDTIME. Patient not taking: Reported on 12/31/2014 11/26/13   Ejiroghene Arlyce Dice, MD  tolterodine (DETROL LA) 4 MG 24 hr capsule Take 1 capsule (4 mg total) by mouth daily. 12/31/14   Billy Fischer, MD  traMADol (ULTRAM) 50 MG tablet Take 1 tablet (50 mg total) by mouth every 6 (six) hours as needed. Patient not taking: Reported on 12/31/2014 10/15/14 10/15/15  Norman Herrlich, MD   Meds Ordered and Administered this Visit  Medications - No data to display  BP 170/98 mmHg  Pulse 92  Temp(Src) 98.2 F (36.8 C) (Oral)  SpO2 96% No data found.   Physical Exam  Constitutional: She is oriented to person, place, and time. She appears well-developed and well-nourished. No distress.  Neck: Neck supple.  Cardiovascular: Normal rate, regular rhythm and normal heart sounds.   No murmur heard. Pulmonary/Chest: Effort normal and breath sounds normal. No respiratory distress. She has no wheezes. She has no rales.  Neurological: She is alert and oriented to person, place, and time.    ED Course  Procedures (including critical care time)  Labs Review Labs Reviewed  POCT URINALYSIS DIP (DEVICE) - Abnormal; Notable for the following:    Leukocytes, UA TRACE (*)    All other components within normal limits  POCT I-STAT, CHEM 8 - Abnormal;  Notable for the following:    Chloride 99 (*)    BUN 36 (*)    Creatinine, Ser 1.40 (*)    Glucose, Bld 182 (*)    All other components within normal limits  URINE CULTURE    Imaging Review No results found.   MDM   1. Other fatigue   2. Poor sleep pattern    Blood work is stable. I did send her urine for culture. I suspect her symptoms are coming from stress and poor sleep. Her blood pressure is elevated today. She will follow-up with her primary  care doctor in about 2 weeks. I've encouraged her to discuss her joint aches as well as poor sleep with her doctor. Follow-up as needed.    Melony Overly, MD 03/08/15 1444

## 2015-03-08 NOTE — ED Notes (Signed)
Here with c/o possible UTI States , "I don't feel like myself" Denies pressure, burning, fever,chills Slight low back pain reported

## 2015-03-09 LAB — URINE CULTURE: Culture: 3000

## 2015-03-22 ENCOUNTER — Encounter (HOSPITAL_COMMUNITY): Payer: Self-pay | Admitting: Emergency Medicine

## 2015-03-22 ENCOUNTER — Emergency Department (INDEPENDENT_AMBULATORY_CARE_PROVIDER_SITE_OTHER)
Admission: EM | Admit: 2015-03-22 | Discharge: 2015-03-22 | Disposition: A | Discharge status: Home / Self Care | Payer: Self-pay | Source: Home / Self Care | Attending: Family Medicine | Admitting: Family Medicine

## 2015-03-22 DIAGNOSIS — M5416 Radiculopathy, lumbar region: Secondary | ICD-10-CM

## 2015-03-22 MED ORDER — METHYLPREDNISOLONE ACETATE 80 MG/ML IJ SUSP
80.0000 mg | Freq: Once | INTRAMUSCULAR | Status: AC
  Administered 2015-03-22: 80 mg via INTRAMUSCULAR

## 2015-03-22 MED ORDER — METHYLPREDNISOLONE ACETATE 80 MG/ML IJ SUSP
INTRAMUSCULAR | Status: AC
  Filled 2015-03-22: qty 1

## 2015-03-22 NOTE — ED Notes (Signed)
Pt here with chronic intermit left hip pain Pain with bearing weight, walking and bending

## 2015-03-22 NOTE — ED Provider Notes (Signed)
CSN: [id number]     Arrival date & time 03/22/15  1536 History   First MD Initiated Contact with Patient 03/22/15 1723     Chief Complaint  Patient presents with  . Hip Pain   (Consider location/radiation/quality/duration/timing/severity/associated sxs/prior Treatment) HPI Comments: [name] is a 60 yo female who presents with left buttock pain. 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 It has been pretty painful for some time and now the left buttock has been bothersome. She notes no weakness or numbness. No specific lower back pain. No dysuria. Pain is worsened with ROM and standing/walking.  Patient is a 60 y.o. female presenting with hip pain. The history is provided by the patient.  Hip Pain Pertinent negatives include no abdominal pain.    Past Medical History  Diagnosis Date  . Diabetes mellitus type II, uncontrolled ([place])   . Hypertension   . Hyperlipidemia   . Ovarian cyst, left   . Anemia     due to menorrhagia, BL 8-10  . Vaginal cyst     nabothian and bartholin  . Anxiety   . Depression   . Postmenopausal bleeding 06/12/2008  . 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 UTI (lower urinary tract infection)   . IBS (irritable bowel syndrome)   . Chronic back pain   . Chronic abdominal pain   . DDD (degenerative disc disease), lumbar   . Diabetes mellitus without complication ([place])   . History of palpitations     evaluated recently 12'15  . Colitis   . CKD (chronic kidney disease) stage 3, GFR 30-59 ml/min     baseline creatinine 1.4-1.7  . Bilateral renal cysts 01/15/2009    Qualifier: Diagnosis of  By: Tyrell Antonio MD, Jerald Kief     Past Surgical History  Procedure Laterality Date  . Cardiac surgery      to repair congenital defect as a child- 60months old  . Esophagogastroduodenoscopy (egd) with propofol N/A 10/01/2014    Procedure: ESOPHAGOGASTRODUODENOSCOPY (EGD) WITH PROPOFOL;  Surgeon: Arta Silence, MD;   Location: [facility];  Service: Endoscopy;  Laterality: N/A;  . Colonoscopy with propofol N/A 10/01/2014    Procedure: COLONOSCOPY WITH PROPOFOL;  Surgeon: Arta Silence, MD;  Location: [facility];  Service: Endoscopy;  Laterality: N/A;   Family History  Problem Relation Age of Onset  . Stroke Father   . Heart attack Father     Had MI in his 90s  . Stomach cancer Paternal Grandmother   . Diabetes Maternal Grandmother   . Cerebral palsy Daughter   . Anesthesia problems Neg Hx   . Hypotension Neg Hx   . Malignant hyperthermia Neg Hx   . Pseudochol deficiency Neg Hx    Social History  Substance Use Topics  . Smoking status: Never Smoker   . Smokeless tobacco: Never Used  . Alcohol Use: No   OB History    Gravida Para Term Preterm AB TAB SAB Ectopic Multiple Living   3 1 1  2  2   1      Review of Systems  Constitutional: Negative for fever and unexpected weight change.  Gastrointestinal: Negative for abdominal pain.  Genitourinary: Negative for dysuria, flank pain and pelvic pain.  Musculoskeletal: Positive for gait problem. Negative for back pain and joint swelling.  Skin: Negative.   Psychiatric/Behavioral: Negative.     Allergies  Review of patient's allergies indicates no known allergies.  Home  Medications   Prior to Admission medications   Medication Sig Start Date End Date Taking? Authorizing Provider  acetaminophen (TYLENOL) 650 MG CR tablet Take 650 mg by mouth every 8 (eight) hours as needed for pain.    Historical Provider, MD  ciprofloxacin (CIPRO) 500 MG tablet Take 1 tablet (500 mg total) by mouth 2 (two) times daily. Patient not taking: Reported on 12/31/2014 12/10/14   Lysbeth Penner, FNP  diclofenac sodium (VOLTAREN) 1 % GEL Apply 4 g topically 4 (four) times daily. Patient not taking: Reported on 12/31/2014 12/28/14   Lysbeth Penner, FNP  enalapril (VASOTEC) 10 MG tablet Take 1 tablet (10 mg total) by mouth daily. 06/08/14   Carly Montey Hora, MD   gabapentin (NEURONTIN) 100 MG capsule Take 100 mg by mouth 3 (three) times daily.    Historical Provider, MD  Insulin Glargine (LANTUS SOLOSTAR) 100 UNIT/ML Solostar Pen Inject 22 Units into the skin daily at 10 pm. 06/10/14   Juliet Rude, MD  meloxicam (MOBIC) 7.5 MG tablet Take 1 tablet (7.5 mg total) by mouth daily. 01/01/15   April Palumbo, MD  metroNIDAZOLE (FLAGYL) 500 MG tablet Take 1 tablet (500 mg total) by mouth 2 (two) times daily. 12/31/14   Billy Fischer, MD  pioglitazone (ACTOS) 15 MG tablet Take 2 tablets (30 mg total) by mouth daily. 11/11/14   Juliet Rude, MD  simvastatin (ZOCOR) 40 MG tablet TAKE 1 TABLET (40 MG TOTAL) BY MOUTH AT BEDTIME. Patient not taking: Reported on 12/31/2014 11/26/13   Ejiroghene Arlyce Dice, MD  tolterodine (DETROL LA) 4 MG 24 hr capsule Take 1 capsule (4 mg total) by mouth daily. 12/31/14   Billy Fischer, MD  traMADol (ULTRAM) 50 MG tablet Take 1 tablet (50 mg total) by mouth every 6 (six) hours as needed. Patient not taking: Reported on 12/31/2014 10/15/14 10/15/15  Norman Herrlich, MD   Meds Ordered and Administered this Visit   Medications  methylPREDNISolone acetate (DEPO-MEDROL) injection 80 mg (80 mg Intramuscular Given 03/22/15 1758)    BP 144/65 mmHg  Pulse 91  Temp(Src) 98.8 F (37.1 C) (Oral)  Resp 18  SpO2 98% No data found.   Physical Exam  Constitutional: She is oriented to person, place, and time. She appears well-developed and well-nourished.  HENT:  Head: Normocephalic and atraumatic.  Pulmonary/Chest: Effort normal.  Musculoskeletal:  Full ROM of the left hip without pain. Left sciatic region is tender to palpation, pain reproduced with extension of the lumbar spine. No weakness on exam.   Neurological: She is alert and oriented to person, place, and time. Coordination normal.  Skin: Skin is warm and dry.  Psychiatric: Her behavior is normal.  Nursing note and vitals reviewed.   ED Course  Procedures (including critical  care time)  Labs Review Labs Reviewed - No data to display  Imaging Review No results found.   Visual Acuity Review  Right Eye Distance:   Left Eye Distance:   Bilateral Distance:    Right Eye Near:   Left Eye Near:    Bilateral Near:         MDM   1. Lumbar radiculopathy, chronic    History of OA with on and off pain. She has an orthopedic and will follow up. Acutely she would like a steroid injection which I will give to her IM Depomedrol 80mg . Suggest gentle ROM and no heavy lifting. May use Tylenol Arthritis 2 every 8 hours. No NSAIDs in  the setting of CKD.     [name], PA-C 03/22/15 1821

## 2015-03-22 NOTE — Discharge Instructions (Signed)
Lumbosacral Radiculopathy Lumbosacral radiculopathy is a condition that involves the spinal nerves and nerve roots in the low back and bottom of the spine. The condition develops when these nerves and nerve roots move out of place or become inflamed and cause symptoms. CAUSES This condition may be caused by:  Pressure from a disk that bulges out of place (herniated disk). A disk is a plate of cartilage that separates bones in the spine.  Disk degeneration.  A narrowing of the bones of the lower back (spinal stenosis).  A tumor.  An infection.  An injury that places sudden pressure on the disks that cushion the bones of your lower spine. RISK FACTORS This condition is more likely to develop in:  Males aged 30-50 years.  Females aged 69-60 years.  People who lift improperly.  People who are overweight or live a sedentary lifestyle.  People who smoke.  People who perform repetitive activities that strain the spine. SYMPTOMS Symptoms of this condition include:  Pain that goes down from the back into the legs (sciatica). This is the most common symptom. The pain may be worse with sitting, coughing, or sneezing.  Pain and numbness in the arms and legs.  Muscle weakness.  Tingling.  Loss of bladder control or bowel control. DIAGNOSIS This condition is diagnosed with a physical exam and medical history. If the pain is lasting, you may have tests, such as:  MRI scan.  X-ray.  CT scan.  Myelogram.  Nerve conduction study. TREATMENT This condition is often treated with:  Hot packs and ice applied to affected areas.  Stretches to improve flexibility.  Exercises to strengthen back muscles.  Physical therapy.  Pain medicine.  A steroid injection in the spine. In some cases, no treatment is needed. If the condition is long-lasting (chronic), or if symptoms are severe, treatment may involve surgery or lifestyle changes, such as following a weight loss plan. HOME  CARE INSTRUCTIONS Medicines  Take medicines only as directed by your health care provider.  Do not drive or operate heavy machinery while taking pain medicine. Injury Care  Apply a heat pack to the injured area as directed by your health care provider.  Apply ice to the affected area:  Put ice in a plastic bag.  Place a towel between your skin and the bag.  Leave the ice on for 20-30 minutes, every 2 hours while you are awake or as needed. Or, leave the ice on for as long as directed by your health care provider. Other Instructions  If you were shown how to do any exercises or stretches, do them as directed by your health care provider.  If your health care provider prescribed a diet or exercise program, follow it as directed.  Keep all follow-up visits as directed by your health care provider. This is important. SEEK MEDICAL CARE IF:  Your pain does not improve over time even when taking pain medicines. SEEK IMMEDIATE MEDICAL CARE IF:  Your develop severe pain.  Your pain suddenly gets worse.  You develop increasing weakness in your legs.  You lose the ability to control your bladder or bowel.  You have difficulty walking or balancing.  You have a fever.   This information is not intended to replace advice given to you by your health care provider. Make sure you discuss any questions you have with your health care provider.   You have pain from your back into the left hip, most likely because of your change in gait  with your knee. The steroid injection will help, but please keep a close eye on your sugars as you did in the past with this. May take tylenol Arthritis 2 every 8 hours for pain. Keep f/u with your orthopedic for further back needs. Hope you feel better soon.    Document Released: 01/24/2005 Document Revised: 06/10/2014 Document Reviewed: 01/20/2014 Elsevier Interactive Patient Education [employer].

## 2015-03-23 ENCOUNTER — Telehealth: Payer: Self-pay | Admitting: Dietician

## 2015-03-23 DIAGNOSIS — N183 Chronic kidney disease, stage 3 (moderate): Principal | ICD-10-CM

## 2015-03-23 DIAGNOSIS — E1122 Type 2 diabetes mellitus with diabetic chronic kidney disease: Secondary | ICD-10-CM

## 2015-03-23 NOTE — Telephone Encounter (Signed)
Called [name] for Actos refills: they have 3 prescriptions on file but none were filled. She has not filled since 07/2014, October 2016 they received another prescription but none were filled either none filled

## 2015-03-23 NOTE — Telephone Encounter (Addendum)
Diabetes educator called patient as part of project to lower a1C between 8-9%. Spoke with patient who was difficult to follow in the phone but said she felt the most important thing was to follow her diet, but that she has a hard time with that due to stress, Would like to administer diabetes distress screening while she is here in the office.  She is also having a hard time renewing her orange card so is self pay. She denies having trouble affording her diabetes medications

## 2015-03-24 ENCOUNTER — Ambulatory Visit (INDEPENDENT_AMBULATORY_CARE_PROVIDER_SITE_OTHER): Payer: Self-pay | Admitting: Internal Medicine

## 2015-03-24 ENCOUNTER — Encounter: Payer: Self-pay | Admitting: Internal Medicine

## 2015-03-24 VITALS — BP 148/73 | HR 80 | Temp 97.4°F | Resp 18 | Ht 66.0 in | Wt 168.4 lb

## 2015-03-24 DIAGNOSIS — E1159 Type 2 diabetes mellitus with other circulatory complications: Secondary | ICD-10-CM

## 2015-03-24 DIAGNOSIS — I152 Hypertension secondary to endocrine disorders: Secondary | ICD-10-CM

## 2015-03-24 DIAGNOSIS — G8929 Other chronic pain: Secondary | ICD-10-CM

## 2015-03-24 DIAGNOSIS — E1122 Type 2 diabetes mellitus with diabetic chronic kidney disease: Secondary | ICD-10-CM

## 2015-03-24 DIAGNOSIS — I10 Essential (primary) hypertension: Secondary | ICD-10-CM

## 2015-03-24 DIAGNOSIS — M25511 Pain in right shoulder: Secondary | ICD-10-CM

## 2015-03-24 DIAGNOSIS — M25562 Pain in left knee: Secondary | ICD-10-CM

## 2015-03-24 DIAGNOSIS — E785 Hyperlipidemia, unspecified: Secondary | ICD-10-CM

## 2015-03-24 DIAGNOSIS — N183 Chronic kidney disease, stage 3 unspecified: Secondary | ICD-10-CM

## 2015-03-24 DIAGNOSIS — Z794 Long term (current) use of insulin: Secondary | ICD-10-CM

## 2015-03-24 DIAGNOSIS — M25552 Pain in left hip: Secondary | ICD-10-CM

## 2015-03-24 DIAGNOSIS — E1169 Type 2 diabetes mellitus with other specified complication: Secondary | ICD-10-CM

## 2015-03-24 DIAGNOSIS — E784 Other hyperlipidemia: Secondary | ICD-10-CM

## 2015-03-24 DIAGNOSIS — Z79899 Other long term (current) drug therapy: Secondary | ICD-10-CM

## 2015-03-24 DIAGNOSIS — I129 Hypertensive chronic kidney disease with stage 1 through stage 4 chronic kidney disease, or unspecified chronic kidney disease: Secondary | ICD-10-CM

## 2015-03-24 LAB — GLUCOSE, CAPILLARY: Glucose-Capillary: 147 mg/dL — ABNORMAL HIGH (ref 65–99)

## 2015-03-24 LAB — POCT GLYCOSYLATED HEMOGLOBIN (HGB A1C): Hemoglobin A1C: 8.1

## 2015-03-24 MED ORDER — INSULIN GLARGINE 100 UNIT/ML SOLOSTAR PEN
20.0000 [IU] | PEN_INJECTOR | Freq: Every day | SUBCUTANEOUS | Status: DC
Start: 2015-03-24 — End: 2015-10-20

## 2015-03-24 NOTE — Assessment & Plan Note (Signed)
She has not taken her Zocor due to costs, but I double checked the pricing and should only be $4 at [facility]. I gave her refills and instructed her to fill this medication.  - Continue Zocor 40 mg daily

## 2015-03-24 NOTE — Patient Instructions (Signed)
It was nice to see you today, [name].  - Decrease your lantus to 20 units at bedtime. If you continue to have low blood sugars, decrease the lantus to 18 units. - Try to check your blood sugars 1-2 times daily - Keep taking Gabapentin 200 mg in AM and 100 mg in evening for your right arm pain - Try taking Tylenol 650 mg three times daily as needed for your hip pain - Please pick up your Simvastatin. This medicine should be $4 at [facility]. - I would like for you to come back in 1 month so we can reassess your blood sugars  General Instructions:   Please bring your medicines with you each time you come to clinic.  Medicines may include prescription medications, over-the-counter medications, herbal remedies, eye drops, vitamins, or other pills.   Progress Toward Treatment Goals:  Treatment Goal 03/24/2015  Hemoglobin A1C -  Blood pressure unchanged    Self Care Goals & Plans:  Self Care Goal 11/11/2014  Manage my medications take my medicines as prescribed; bring my medications to every visit; refill my medications on time  Monitor my health keep track of my blood glucose; keep track of my blood pressure; bring my glucose meter and log to each visit; check my feet daily  Eat healthy foods eat more vegetables; eat foods that are low in salt; eat baked foods instead of fried foods  Be physically active find an activity I enjoy    Home Blood Glucose Monitoring 09/02/2014  Check my blood sugar 2 times a day  When to check my blood sugar before breakfast; before dinner     Care Management & Community Referrals:  Referral 06/17/2014  Referrals made for care management support financial counselor

## 2015-03-24 NOTE — Assessment & Plan Note (Signed)
She reports left hip pain and left knee pain which have been present for several months now. Her left hip x-ray on 10/16/14 shows mild narrowing of the left hip joint. X-rays of her left knee on 11/20 and 11/24 are consistent with severe osteoarthritis.  - Recommended to increase Tylenol to 650 mg TID to see if this helps control her pain and then titrate frequency down as needed - She follows with Dr. Paulla Fore (sports medicine)

## 2015-03-24 NOTE — Assessment & Plan Note (Signed)
Lab Results  Component Value Date   HGBA1C 8.1 03/24/2015   HGBA1C 8.4 11/11/2014   HGBA1C 8.3 08/05/2014     Assessment: Diabetes control: fair control Progress toward A1C goal:  unchanged Comments: Her A1c has surprisingly stayed the same as she has been off of Actos and not had good follow up. I am concerned because she reports frequent lows (almost daily). I have advised her to decrease her Lantus and that I want her to have better follow up so we can keep her diabetes in check.   Plan: Medications:  Stop Actos. Decrease Lantus to 20 units QHS.  Home glucose monitoring: Frequency:  1-2 times daily Timing: before breakfast, before dinner Instruction/counseling given: reminded to bring blood glucose meter & log to each visit, reminded to bring medications to each visit and discussed foot care Educational resources provided: handout Self management tools provided: home glucose logbook, instructions for home glucose monitoring Other plans:  - Follow up in 1 month to reassess blood sugars - Foot exam today - Discussed low blood sugar management

## 2015-03-24 NOTE — Assessment & Plan Note (Addendum)
Reports warm, burning pain that radiates down from her right neck to her right forearm seems most consistent with nerve impingement. Will continue Gabapentin as started by sports medicine as this seems to be controlling her pain. - Continue Gabapentin 200 mg in AM and 100 mg in evening - Continue f/u with sports medicine as needed

## 2015-03-24 NOTE — Progress Notes (Signed)
   Subjective:    Patient ID: [name], female    DOB: Feb 01, 1956, 60 y.o.   MRN: [id number]  HPI Ms. Rim is a 60yo woman with PMHx of HTN, type 2 DM, and CKD stage 3 who presents today for follow up of her hypertension.  HTN: BP today is 148/73. She takes Enalapril 10 mg daily.   Type 2 DM: Last A1c 8.4 in Oct 2016. She has not followed up since that time. Today her A1c is 8.1. She reports taking Lantus 22 units QHS. She has not taken Actos for several months now due to being unable to afford the medication. She reports frequent lows (daily some weeks) with lowest blood sugar of 61. She did not bring her glucometer today. She reports checking her blood sugar once daily.   Left Knee Pain/Left Hip Pain: She reports having a fall a few days before Christmas which started her left hip pain. She has been seen in the ED several times for her left knee pain. She reports being told she has "bone on bone arthritis." She has been taking Tylenol as needed. She follows with Dr. Paulla Fore with sports medicine as well.   "Pinched Nerve in Neck": She describes having a warm, burning sensation that runs down her right neck to her right upper forearm. She states she was seen in the ED for this and told she had a pinched nerve. She has been seen by Dr. Paulla Fore with sports medicine and was started on gabapentin. She reports taking Gabapentin 200 mg in AM and 100 mg in the evening. She states this does control her pain.   Hyperlipidemia: She reports she has not taken her Zocor for several months due to cost. Last LDL 93 in July 2016.    Review of Systems General: Denies fever, chills, night sweats, changes in weight, changes in appetite HEENT: Denies headaches, ear pain, changes in vision, rhinorrhea, sore throat CV: Denies CP, palpitations, SOB, orthopnea Pulm: Denies SOB, cough, wheezing GI: Reports abdominal pain-intermittent, chronic. Denies nausea, vomiting, diarrhea, constipation, melena,  hematochezia GU: Reports recent UTI. Denies dysuria, hematuria, frequency Msk: Reports left hip pain, left knee pain. Denies muscle cramps Neuro: Reports tingling/numbness in RUE. Denies weakness Skin: Denies rashes, bruising Psych: Denies depression, hallucinations    Objective:   Physical Exam General: middle aged woman sitting up, appears anxious, pressured speech HEENT: [place]/AT, EOMI, sclera anicteric, mucus membranes moist CV: RRR, 3/6 systolic murmur heard best at RUSB Pulm: CTA bilaterally, breaths non-labored Abd: BS+, soft, non-tender Ext: warm, no peripheral edema Neuro: alert and oriented x 3, no pain elicited with straight leg raise bilaterally    Assessment & Plan:  Please refer to A&P documentation.

## 2015-03-24 NOTE — Assessment & Plan Note (Signed)
BP Readings from Last 3 Encounters:  03/24/15 148/73  03/22/15 144/65  03/08/15 170/98    Lab Results  Component Value Date   NA 139 03/08/2015   K 4.7 03/08/2015   CREATININE 1.40* 03/08/2015    Assessment: Blood pressure control: mildly elevated Progress toward BP goal:  unchanged Comments: BP remains mildly elevated, but acceptable.   Plan: Medications:  Continue Enalapril 10 mg daily

## 2015-03-26 NOTE — Progress Notes (Signed)
Internal Medicine Clinic Attending  Case discussed with Dr. Rivet soon after the resident saw the patient.  We reviewed the resident's history and exam and pertinent patient test results.  I agree with the assessment, diagnosis, and plan of care documented in the resident's note.  

## 2015-04-21 ENCOUNTER — Encounter: Payer: Self-pay | Admitting: Internal Medicine

## 2015-04-21 ENCOUNTER — Emergency Department (INDEPENDENT_AMBULATORY_CARE_PROVIDER_SITE_OTHER)
Admission: EM | Admit: 2015-04-21 | Discharge: 2015-04-21 | Disposition: A | Discharge status: Home / Self Care | Payer: Self-pay | Source: Home / Self Care | Attending: Family Medicine | Admitting: Family Medicine

## 2015-04-21 ENCOUNTER — Ambulatory Visit: Payer: Self-pay | Admitting: Dietician

## 2015-04-21 ENCOUNTER — Encounter (HOSPITAL_COMMUNITY): Payer: Self-pay | Admitting: *Deleted

## 2015-04-21 DIAGNOSIS — N390 Urinary tract infection, site not specified: Secondary | ICD-10-CM

## 2015-04-21 DIAGNOSIS — G8929 Other chronic pain: Secondary | ICD-10-CM

## 2015-04-21 DIAGNOSIS — M25552 Pain in left hip: Secondary | ICD-10-CM

## 2015-04-21 LAB — POCT URINALYSIS DIP (DEVICE)
BILIRUBIN URINE: NEGATIVE
GLUCOSE, UA: 100 mg/dL — AB
Hgb urine dipstick: NEGATIVE
Ketones, ur: NEGATIVE mg/dL
NITRITE: NEGATIVE
Protein, ur: NEGATIVE mg/dL
Specific Gravity, Urine: 1.015 (ref 1.005–1.030)
UROBILINOGEN UA: 0.2 mg/dL (ref 0.0–1.0)
pH: 6.5 (ref 5.0–8.0)

## 2015-04-21 MED ORDER — MELOXICAM 7.5 MG PO TABS: 7.5000 mg | ORAL_TABLET | Freq: Two times a day (BID) | ORAL | Status: DC

## 2015-04-21 MED ORDER — KETOROLAC TROMETHAMINE 30 MG/ML IJ SOLN: 30.0000 mg | Freq: Once | INTRAMUSCULAR | Status: DC

## 2015-04-21 MED ORDER — CEPHALEXIN 500 MG PO CAPS: 500.0000 mg | ORAL_CAPSULE | Freq: Four times a day (QID) | ORAL | Status: DC

## 2015-04-21 NOTE — Discharge Instructions (Signed)
Take medicine as prescribed and see your doctor if further problems °

## 2015-04-21 NOTE — ED Notes (Signed)
Pt reports  l  Hip  Pain   With  Pain  On  Ambulation      Also  Reports  A  Warm  Sensation  When  She  Urinates      She  Wants  To  Be  Checked  For a  uti

## 2015-04-21 NOTE — ED Provider Notes (Signed)
CSN: [id number]     Arrival date & time 04/21/15  1543 History   First MD Initiated Contact with Patient 04/21/15 1755     Chief Complaint  Patient presents with  . Hip Pain   (Consider location/radiation/quality/duration/timing/severity/associated sxs/prior Treatment) Patient is a 60 y.o. female presenting with hip pain. The history is provided by the patient.  Hip Pain This is a chronic problem. The problem has been gradually worsening. Pertinent negatives include no abdominal pain.    Past Medical History  Diagnosis Date  . Diabetes mellitus type II, uncontrolled ([place])   . Hypertension   . Hyperlipidemia   . Ovarian cyst, left   . Anemia     due to menorrhagia, BL 8-10  . Vaginal cyst     nabothian and bartholin  . Anxiety   . Depression   . Postmenopausal bleeding 06/12/2008  . 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 UTI (lower urinary tract infection)   . IBS (irritable bowel syndrome)   . Chronic back pain   . Chronic abdominal pain   . DDD (degenerative disc disease), lumbar   . Diabetes mellitus without complication ([place])   . History of palpitations     evaluated recently 12'15  . Colitis   . CKD (chronic kidney disease) stage 3, GFR 30-59 ml/min     baseline creatinine 1.4-1.7  . Bilateral renal cysts 01/15/2009    Qualifier: Diagnosis of  By: Tyrell Antonio MD, Jerald Kief     Past Surgical History  Procedure Laterality Date  . Cardiac surgery      to repair congenital defect as a child- 23months old  . Esophagogastroduodenoscopy (egd) with propofol N/A 10/01/2014    Procedure: ESOPHAGOGASTRODUODENOSCOPY (EGD) WITH PROPOFOL;  Surgeon: Arta Silence, MD;  Location: [facility];  Service: Endoscopy;  Laterality: N/A;  . Colonoscopy with propofol N/A 10/01/2014    Procedure: COLONOSCOPY WITH PROPOFOL;  Surgeon: Arta Silence, MD;  Location: [facility];  Service: Endoscopy;  Laterality: N/A;   Family History  Problem Relation Age of Onset  .  Stroke Father   . Heart attack Father     Had MI in his 13s  . Stomach cancer Paternal Grandmother   . Diabetes Maternal Grandmother   . Cerebral palsy Daughter   . Anesthesia problems Neg Hx   . Hypotension Neg Hx   . Malignant hyperthermia Neg Hx   . Pseudochol deficiency Neg Hx    Social History  Substance Use Topics  . Smoking status: Never Smoker   . Smokeless tobacco: Never Used  . Alcohol Use: No   OB History    Gravida Para Term Preterm AB TAB SAB Ectopic Multiple Living   3 1 1  2  2   1      Review of Systems  Constitutional: Negative.   Cardiovascular: Negative.   Gastrointestinal: Negative.  Negative for abdominal pain.  Genitourinary: Positive for dysuria. Negative for urgency, hematuria and pelvic pain.  Musculoskeletal: Negative.   All other systems reviewed and are negative.   Allergies  Review of patient's allergies indicates no known allergies.  Home Medications   Prior to Admission medications   Medication Sig Start Date End Date Taking? Authorizing Provider  acetaminophen (TYLENOL) 650 MG CR tablet Take 650 mg by mouth every 8 (eight) hours as needed for pain.    Historical Provider, MD  cephALEXin (KEFLEX) 500 MG capsule Take 1 capsule (500 mg total) by mouth 4 (four) times daily.  Take all of medicine and drink lots of fluids 04/21/15   Billy Fischer, MD  enalapril (VASOTEC) 10 MG tablet Take 1 tablet (10 mg total) by mouth daily. 06/08/14   Carly Montey Hora, MD  gabapentin (NEURONTIN) 300 MG capsule Take 300 mg by mouth 2 (two) times daily.    Historical Provider, MD  Insulin Glargine (LANTUS SOLOSTAR) 100 UNIT/ML Solostar Pen Inject 20 Units into the skin daily at 10 pm. 03/24/15   Juliet Rude, MD  simvastatin (ZOCOR) 40 MG tablet TAKE 1 TABLET (40 MG TOTAL) BY MOUTH AT BEDTIME. Patient not taking: Reported on 12/31/2014 11/26/13   Ejiroghene Arlyce Dice, MD   Meds Ordered and Administered this Visit  Medications - No data to display  BP 147/78 mmHg   Pulse 94  Temp(Src) 97.7 F (36.5 C) (Oral)  Resp 18  SpO2 99% No data found.   Physical Exam  Constitutional: She is oriented to person, place, and time. She appears well-developed and well-nourished. No distress.  Abdominal: Soft. Bowel sounds are normal.  Musculoskeletal: She exhibits tenderness.       Left hip: She exhibits decreased range of motion and tenderness. She exhibits no deformity.  Neurological: She is alert and oriented to person, place, and time.  Skin: Skin is warm and dry.  Nursing note and vitals reviewed.   ED Course  Procedures (including critical care time)  Labs Review Labs Reviewed  POCT URINALYSIS DIP (DEVICE) - Abnormal; Notable for the following:    Glucose, UA 100 (*)    Leukocytes, UA LARGE (*)    All other components within normal limits    Imaging Review No results found.   Visual Acuity Review  Right Eye Distance:   Left Eye Distance:   Bilateral Distance:    Right Eye Near:   Left Eye Near:    Bilateral Near:         MDM   1. UTI (lower urinary tract infection)   2. Chronic hip pain, left       Billy Fischer, MD 04/27/15 [phone]

## 2015-04-22 ENCOUNTER — Encounter (HOSPITAL_COMMUNITY): Payer: Self-pay | Admitting: *Deleted

## 2015-04-22 ENCOUNTER — Inpatient Hospital Stay (HOSPITAL_COMMUNITY)
Admission: AD | Admit: 2015-04-22 | Discharge: 2015-04-22 | Disposition: A | Discharge status: Home / Self Care | Payer: Self-pay | Source: Ambulatory Visit | Attending: Obstetrics and Gynecology | Admitting: Obstetrics and Gynecology

## 2015-04-22 DIAGNOSIS — R102 Pelvic and perineal pain: Secondary | ICD-10-CM

## 2015-04-22 DIAGNOSIS — R1031 Right lower quadrant pain: Secondary | ICD-10-CM | POA: Insufficient documentation

## 2015-04-22 DIAGNOSIS — E1165 Type 2 diabetes mellitus with hyperglycemia: Secondary | ICD-10-CM | POA: Insufficient documentation

## 2015-04-22 DIAGNOSIS — K589 Irritable bowel syndrome without diarrhea: Secondary | ICD-10-CM | POA: Insufficient documentation

## 2015-04-22 DIAGNOSIS — N390 Urinary tract infection, site not specified: Secondary | ICD-10-CM | POA: Insufficient documentation

## 2015-04-22 DIAGNOSIS — Z8249 Family history of ischemic heart disease and other diseases of the circulatory system: Secondary | ICD-10-CM | POA: Insufficient documentation

## 2015-04-22 DIAGNOSIS — N83209 Unspecified ovarian cyst, unspecified side: Secondary | ICD-10-CM | POA: Insufficient documentation

## 2015-04-22 DIAGNOSIS — I129 Hypertensive chronic kidney disease with stage 1 through stage 4 chronic kidney disease, or unspecified chronic kidney disease: Secondary | ICD-10-CM | POA: Insufficient documentation

## 2015-04-22 DIAGNOSIS — Z794 Long term (current) use of insulin: Secondary | ICD-10-CM | POA: Insufficient documentation

## 2015-04-22 LAB — URINE MICROSCOPIC-ADD ON

## 2015-04-22 LAB — URINALYSIS, ROUTINE W REFLEX MICROSCOPIC
Bilirubin Urine: NEGATIVE
Glucose, UA: 250 mg/dL — AB
Ketones, ur: NEGATIVE mg/dL
Nitrite: NEGATIVE
PROTEIN: NEGATIVE mg/dL
SPECIFIC GRAVITY, URINE: 1.015 (ref 1.005–1.030)
pH: 5.5 (ref 5.0–8.0)

## 2015-04-22 NOTE — Discharge Instructions (Signed)

## 2015-04-22 NOTE — MAU Note (Addendum)
abd pain in RLQ, dx with bladder infection yesterday.  Was also given med for back pian.  Started antibiotic, did not start other med. Hx of ovarian cyst, unsure if that or from UTI

## 2015-04-22 NOTE — MAU Provider Note (Signed)
Chief Complaint:  Abdominal Pain   First Provider Initiated Contact with Patient 04/22/15 1923     HPI  [name] is a 60 y.o. [id number] who presents to maternity admissions reporting intermittent lower abdominal pain.  States pain is gone now.  Is currently being treated for UTI as of yesterday  Has hip and knee pain she is under treatment for also. She reports no vaginal bleeding, vaginal itching/burning, urinary symptoms, h/a, dizziness, n/v, or fever/chills.    Cannot remember when last period was, many years ago.  Not sexually active in years.  Chart review shows last pap was in 2015 with normal pelvic US then.  Seen in 2012 by Dr Harolyn Rutherford for similar pain, no findings.  RN Note:  Expand All Collapse All   abd pain in RLQ, dx with bladder infection yesterday. Was also given med for back pian. Started antibiotic, did not start other med. Hx of ovarian cyst, unsure if that or from UTI          Past Medical History: Past Medical History  Diagnosis Date  . Diabetes mellitus type II, uncontrolled ([place])   . Hypertension   . Hyperlipidemia   . Ovarian cyst, left   . Anemia     due to menorrhagia, BL 8-10  . Vaginal cyst     nabothian and bartholin  . Anxiety   . Depression   . Postmenopausal bleeding 06/12/2008  . 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 UTI (lower urinary tract infection)   . IBS (irritable bowel syndrome)   . Chronic back pain   . Chronic abdominal pain   . DDD (degenerative disc disease), lumbar   . Diabetes mellitus without complication ([place])   . History of palpitations     evaluated recently 12'15  . Colitis   . CKD (chronic kidney disease) stage 3, GFR 30-59 ml/min     baseline creatinine 1.4-1.7  . Bilateral renal cysts 01/15/2009    Qualifier: Diagnosis of  By: Tyrell Antonio MD, Jerald Kief      Past obstetric history: OB History  Gravida Para Term Preterm AB SAB TAB Ectopic Multiple Living  3 1 1  2 2    1     # Outcome Date GA  Lbr Len/2nd Weight Sex Delivery Anes PTL Lv  3 Term     F Vag-Spont   Y  2 SAB           1 SAB               Past Surgical History: Past Surgical History  Procedure Laterality Date  . Cardiac surgery      to repair congenital defect as a child- 35months old  . Esophagogastroduodenoscopy (egd) with propofol N/A 10/01/2014    Procedure: ESOPHAGOGASTRODUODENOSCOPY (EGD) WITH PROPOFOL;  Surgeon: Arta Silence, MD;  Location: [facility];  Service: Endoscopy;  Laterality: N/A;  . Colonoscopy with propofol N/A 10/01/2014    Procedure: COLONOSCOPY WITH PROPOFOL;  Surgeon: Arta Silence, MD;  Location: [facility];  Service: Endoscopy;  Laterality: N/A;    Family History: Family History  Problem Relation Age of Onset  . Stroke Father   . Heart attack Father     Had MI in his 49s  . Stomach cancer Paternal Grandmother   . Diabetes Maternal Grandmother   . Cerebral palsy Daughter   . Anesthesia problems Neg Hx   . Hypotension Neg Hx   . Malignant hyperthermia Neg Hx   .  Pseudochol deficiency Neg Hx     Social History: Social History  Substance Use Topics  . Smoking status: Never Smoker   . Smokeless tobacco: Never Used  . Alcohol Use: No    Allergies: No Known Allergies  Meds:  Prescriptions prior to admission  Medication Sig Dispense Refill Last Dose  . acetaminophen (TYLENOL) 650 MG CR tablet Take 650 mg by mouth every 8 (eight) hours as needed for pain.   04/22/2015 at Unknown time  . cephALEXin (KEFLEX) 500 MG capsule Take 1 capsule (500 mg total) by mouth 4 (four) times daily. Take all of medicine and drink lots of fluids 20 capsule 0 04/22/2015 at Unknown time  . enalapril (VASOTEC) 10 MG tablet Take 1 tablet (10 mg total) by mouth daily. 30 tablet 6 04/22/2015 at Unknown time  . gabapentin (NEURONTIN) 300 MG capsule Take 300 mg by mouth 2 (two) times daily.   04/22/2015 at Unknown time  . Insulin Glargine (LANTUS SOLOSTAR) 100 UNIT/ML Solostar Pen Inject 20 Units into the  skin daily at 10 pm. 15 mL 11 04/21/2015 at Unknown time  . meloxicam (MOBIC) 7.5 MG tablet Take 1 tablet (7.5 mg total) by mouth 2 (two) times daily after a meal. (Patient not taking: Reported on 04/22/2015) 30 tablet 1 Not Taking at Unknown time  . simvastatin (ZOCOR) 40 MG tablet TAKE 1 TABLET (40 MG TOTAL) BY MOUTH AT BEDTIME. (Patient not taking: Reported on 12/31/2014) 30 tablet 5 Not Taking  . tolterodine (DETROL LA) 4 MG 24 hr capsule Take 1 capsule (4 mg total) by mouth daily. (Patient not taking: Reported on 03/24/2015) 30 capsule 1 Not Taking    I have reviewed patient's Past Medical Hx, Surgical Hx, Family Hx, Social Hx, medications and allergies.  ROS:  Review of Systems  Constitutional: Negative for fever, chills, appetite change and fatigue.  Respiratory: Negative for shortness of breath.   Gastrointestinal: Positive for abdominal pain (though currently gone). Negative for nausea, vomiting, diarrhea, constipation and abdominal distention.  Genitourinary: Positive for dysuria and pelvic pain (though pain is gone now). Negative for flank pain, vaginal bleeding, vaginal discharge, difficulty urinating and vaginal pain.  Musculoskeletal: Positive for joint swelling.   Other systems negative   Physical Exam  Patient Vitals for the past 24 hrs:  BP Temp Temp src Pulse Resp  04/22/15 1839 136/64 mmHg 98.4 F (36.9 C) Oral 79 18   Constitutional: Well-developed, well-nourished female in no acute distress.  Cardiovascular: normal rate and rhythm Respiratory: normal effort, no distress GI: Abd soft, non-tender.  Nondistended.  No rebound, No guarding.  Bowel Sounds audible  MS: Extremities nontender, no edema, normal ROM Neurologic: Alert and oriented x 4.   Grossly nonfocal. GU: Neg CVAT. Skin:  Warm and Dry Psych:  Affect appropriate.  PELVIC EXAM: Cervix pink, visually closed, without lesion, scant white creamy discharge, vaginal walls and external genitalia normal except for  atrophy and small tags of tissue at introitus and near stenotic cervix.  There were also several areas of darker pink/red tissue in vagina and fornix.    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, though last 2 ultrasounds did not see that  Bimanual exam: Cervix firm, neg CMT, uterus nontender, nonenlarged, adnexa without tenderness, enlargement, or mass    Labs: Results for orders placed or performed during the hospital encounter of 04/22/15 (from the past 24 hour(s))  Urinalysis, Routine w reflex microscopic (not at [facility])     Status: Abnormal   Collection Time:  04/22/15  6:45 PM  Result Value Ref Range   Color, Urine YELLOW YELLOW   APPearance CLEAR CLEAR   Specific Gravity, Urine 1.015 1.005 - 1.030   pH 5.5 5.0 - 8.0   Glucose, UA 250 (A) NEGATIVE mg/dL   Hgb urine dipstick TRACE (A) NEGATIVE   Bilirubin Urine NEGATIVE NEGATIVE   Ketones, ur NEGATIVE NEGATIVE mg/dL   Protein, ur NEGATIVE NEGATIVE mg/dL   Nitrite NEGATIVE NEGATIVE   Leukocytes, UA MODERATE (A) NEGATIVE  Urine microscopic-add on     Status: Abnormal   Collection Time: 04/22/15  6:45 PM  Result Value Ref Range   Squamous Epithelial / LPF 0-5 (A) NONE SEEN   WBC, UA 6-30 0 - 5 WBC/hpf   RBC / HPF 0-5 0 - 5 RBC/hpf   Bacteria, UA FEW (A) NONE SEEN      Imaging:  No results found.  MAU Course/MDM: I have ordered labs as follows: UA Imaging ordered: Outpatient ultrasound of pelvis    Refuses ultrasound tonight  Treatments in MAU included none.   Pt stable at time of discharge.  Assessment: Lower abdominal pain UTI, under treatment Cannot rule out internal pelvic pathology, though doubt.  Plan: Discharge home Recommend follow up with annual exam in clinic Continue Keflex Outpt [place] ordered with follow up clinic visit for results    Medication List    ASK your doctor about these medications        acetaminophen 650 MG CR tablet  Commonly known as:  TYLENOL  Take 650 mg by mouth every 8 (eight)  hours as needed for pain.     cephALEXin 500 MG capsule  Commonly known as:  KEFLEX  Take 1 capsule (500 mg total) by mouth 4 (four) times daily. Take all of medicine and drink lots of fluids     enalapril 10 MG tablet  Commonly known as:  VASOTEC  Take 1 tablet (10 mg total) by mouth daily.     gabapentin 300 MG capsule  Commonly known as:  NEURONTIN  Take 300 mg by mouth 2 (two) times daily.     Insulin Glargine 100 UNIT/ML Solostar Pen  Commonly known as:  LANTUS SOLOSTAR  Inject 20 Units into the skin daily at 10 pm.     meloxicam 7.5 MG tablet  Commonly known as:  MOBIC  Take 1 tablet (7.5 mg total) by mouth 2 (two) times daily after a meal.     simvastatin 40 MG tablet  Commonly known as:  ZOCOR  TAKE 1 TABLET (40 MG TOTAL) BY MOUTH AT BEDTIME.     tolterodine 4 MG 24 hr capsule  Commonly known as:  DETROL LA  Take 1 capsule (4 mg total) by mouth daily.       Encouraged to return here or to other Urgent Care/ED if she develops worsening of symptoms, increase in pain, fever, or other concerning symptoms.   Hansel Feinstein CNM, MSN Certified Nurse-Midwife 04/22/2015 7:24 PM

## 2015-04-22 NOTE — Progress Notes (Signed)
CC: abdominal pain  HPI: [name] is a [id number] who presents today with RLQ abdominal pain. It began earlier in the day while she was driving and has since gone away. She rates the pain a 4/10 and describes it as "achey." She denies constipation, diarrhea, nausea, vomiting, fever, chills, discharge, and bleeding. She also denies dysuria and urinary frequency. She has not been sexually active for several years. She is postmenopausal and states that her last menstrual period was several years ago (she can't remember exactly when).  Yesterday she was seen at urgent care and diagnosed with a UTI, for which she was prescribed Keflex. She has only taken one dose. She is concerned that this pain could be due to either the UTI or an ovarian cyst, which she has a history of.  PMH: Diagnosis Date  . Diabetes mellitus type II, uncontrolled ([place])   . Hypertension   . Hyperlipidemia   . Ovarian cyst, left   . Anemia     due to menorrhagia, BL 8-10  . Vaginal cyst     nabothian and bartholin  . Anxiety   . Depression   . Postmenopausal bleeding 06/12/2008  . 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 UTI (lower urinary tract infection)   . IBS (irritable bowel syndrome)   . Chronic back pain   . Chronic abdominal pain   . DDD (degenerative disc disease), lumbar   . Diabetes mellitus without complication ([place])   . History of palpitations     evaluated recently 12'15  . Colitis   . CKD (chronic kidney disease) stage 3, GFR 30-59 ml/min     baseline creatinine 1.4-1.7  . Bilateral renal cysts 01/15/2009    Qualifier: Diagnosis of By: Tyrell Antonio MD, Jerald Kief    Past Surgical History  Procedure Laterality Date  . Cardiac surgery      to repair congenital defect as a child- 50months old  . Esophagogastroduodenoscopy (egd) with propofol N/A 10/01/2014    Procedure:  ESOPHAGOGASTRODUODENOSCOPY (EGD) WITH PROPOFOL; Surgeon: Arta Silence, MD; Location: [facility]; Service: Endoscopy; Laterality: N/A;  . Colonoscopy with propofol N/A 10/01/2014    Procedure: COLONOSCOPY WITH PROPOFOL; Surgeon: Arta Silence, MD; Location: [facility]; Service: Endoscopy; Laterality: N/A;   Family History  Problem Relation Age of Onset  . Stroke Father   . Heart attack Father     Had MI in his 93s  . Stomach cancer Paternal Grandmother   . Diabetes Maternal Grandmother   . Cerebral palsy Daughter   . Anesthesia problems Neg Hx   . Hypotension Neg Hx   . Malignant hyperthermia Neg Hx   . Pseudochol deficiency Neg Hx    Social History  Substance Use Topics  . Smoking status: Never Smoker   . Smokeless tobacco: Never Used  . Alcohol Use: No   OB History    Gravida Para Term Preterm AB TAB SAB Ectopic Multiple Living   3 1 1  2  2   1         Meds: Prior to Admission Medications     Last Medication Reconciliation Action: Complete Maricela Curet, CPHT 04/22/2015 7:12 PM             Active? Last Dose Start Date End Date Provider    acetaminophen (TYLENOL) 650 MG CR tablet  04/22/2015 at Unknown time -- -- Historical Provider, MD    cephALEXin (KEFLEX) 500 MG capsule  04/22/2015 at Unknown time 04/21/15 --  Billy Fischer, MD    Take 1 capsule (500 mg total) by mouth 4 (four) times daily. Take all of medicine and drink lots of fluids    enalapril (VASOTEC) 10 MG tablet  04/22/2015 at Unknown time 06/08/14 -- Juliet Rude, MD    Take 1 tablet (10 mg total) by mouth daily.    Notes: CYCLE FILL MEDICATION. Authorization is required for next refill.    gabapentin (NEURONTIN) 300 MG capsule  04/22/2015 at Unknown time -- -- Historical Provider, MD    Insulin Glargine (LANTUS SOLOSTAR) 100 UNIT/ML Solostar Pen  04/21/2015 at Unknown time 03/24/15 -- Juliet Rude, MD    Inject  20 Units into the skin daily at 10 pm.    meloxicam (MOBIC) 7.5 MG tablet  Not Taking at Unknown time 04/21/15 -- Billy Fischer, MD    Take 1 tablet (7.5 mg total) by mouth 2 (two) times daily after a meal.    Patient not taking: Reported on 04/22/2015    Allergies: NKDA  PE: Vitals:  Temp 98.4 Pulse 80 RR 18 BP 117/57 04/22/2015 20:22  General: Appears well-developed and well-nourished. No distress.  Cardiovascular: RRR Pulm: CTAB Abdom: Soft, nontender, nondistended. [name] on palpation. Neurological: Alert and oriented to person, place, and time.  GU: Cervical exam showed a bicornuate uterus and speculum exam showed slight discoloration.   Assessment: [name] is a [phone] who presents today with RLQ abdominal pain. The pain could be due to UTI, ovarian cyst, gastroenteritis, or malignancy. Gastroenteritis is unlikely due to lack of GI symptoms. Cervical exam was performed to rule out palpable masses and found a bicornuate uterus and no obvious masses.  Plan: -Patient was advised to continue the Keflex at 4 doses a day -Scheduled for ultrasound in clinic to follow up on potential ovarian cyst (patient is having significant hip pain today and did not want to have it done)  Urbano Heir, Med Student 04/22/15 8:28pm

## 2015-04-26 ENCOUNTER — Encounter (HOSPITAL_COMMUNITY): Payer: Self-pay | Admitting: Emergency Medicine

## 2015-04-26 ENCOUNTER — Telehealth: Payer: Self-pay | Admitting: Internal Medicine

## 2015-04-26 ENCOUNTER — Observation Stay (HOSPITAL_COMMUNITY)
Admission: EM | Admit: 2015-04-26 | Discharge: 2015-04-27 | Disposition: A | Discharge status: Home / Self Care | Payer: Self-pay | Attending: Student in an Organized Health Care Education/Training Program | Admitting: Student in an Organized Health Care Education/Training Program

## 2015-04-26 DIAGNOSIS — I4510 Unspecified right bundle-branch block: Secondary | ICD-10-CM | POA: Insufficient documentation

## 2015-04-26 DIAGNOSIS — N183 Chronic kidney disease, stage 3 unspecified: Secondary | ICD-10-CM | POA: Diagnosis present

## 2015-04-26 DIAGNOSIS — E1169 Type 2 diabetes mellitus with other specified complication: Secondary | ICD-10-CM | POA: Diagnosis present

## 2015-04-26 DIAGNOSIS — N179 Acute kidney failure, unspecified: Principal | ICD-10-CM | POA: Insufficient documentation

## 2015-04-26 DIAGNOSIS — N289 Disorder of kidney and ureter, unspecified: Secondary | ICD-10-CM

## 2015-04-26 DIAGNOSIS — Z794 Long term (current) use of insulin: Secondary | ICD-10-CM | POA: Insufficient documentation

## 2015-04-26 DIAGNOSIS — I129 Hypertensive chronic kidney disease with stage 1 through stage 4 chronic kidney disease, or unspecified chronic kidney disease: Secondary | ICD-10-CM | POA: Insufficient documentation

## 2015-04-26 DIAGNOSIS — E785 Hyperlipidemia, unspecified: Secondary | ICD-10-CM | POA: Insufficient documentation

## 2015-04-26 DIAGNOSIS — K589 Irritable bowel syndrome without diarrhea: Secondary | ICD-10-CM | POA: Insufficient documentation

## 2015-04-26 DIAGNOSIS — F329 Major depressive disorder, single episode, unspecified: Secondary | ICD-10-CM | POA: Insufficient documentation

## 2015-04-26 DIAGNOSIS — Q6102 Congenital multiple renal cysts: Secondary | ICD-10-CM | POA: Insufficient documentation

## 2015-04-26 DIAGNOSIS — N390 Urinary tract infection, site not specified: Secondary | ICD-10-CM | POA: Diagnosis present

## 2015-04-26 DIAGNOSIS — E1122 Type 2 diabetes mellitus with diabetic chronic kidney disease: Secondary | ICD-10-CM | POA: Insufficient documentation

## 2015-04-26 DIAGNOSIS — M25552 Pain in left hip: Secondary | ICD-10-CM | POA: Insufficient documentation

## 2015-04-26 DIAGNOSIS — F419 Anxiety disorder, unspecified: Secondary | ICD-10-CM | POA: Insufficient documentation

## 2015-04-26 DIAGNOSIS — E875 Hyperkalemia: Secondary | ICD-10-CM | POA: Insufficient documentation

## 2015-04-26 LAB — I-STAT CHEM 8, ED
BUN: 67 mg/dL — AB (ref 6–20)
CHLORIDE: 102 mmol/L (ref 101–111)
CREATININE: 1.7 mg/dL — AB (ref 0.44–1.00)
Calcium, Ion: 1.11 mmol/L — ABNORMAL LOW (ref 1.12–1.23)
GLUCOSE: 194 mg/dL — AB (ref 65–99)
HCT: 38 % (ref 36.0–46.0)
Hemoglobin: 12.9 g/dL (ref 12.0–15.0)
POTASSIUM: 5.6 mmol/L — AB (ref 3.5–5.1)
Sodium: 138 mmol/L (ref 135–145)
TCO2: 27 mmol/L (ref 0–100)

## 2015-04-26 LAB — BASIC METABOLIC PANEL
Anion gap: 10 (ref 5–15)
BUN: 54 mg/dL — ABNORMAL HIGH (ref 6–20)
CHLORIDE: 98 mmol/L — AB (ref 101–111)
CO2: 28 mmol/L (ref 22–32)
Calcium: 9.3 mg/dL (ref 8.9–10.3)
Creatinine, Ser: 1.97 mg/dL — ABNORMAL HIGH (ref 0.44–1.00)
GFR calc non Af Amer: 27 mL/min — ABNORMAL LOW (ref >60)
GFR, EST AFRICAN AMERICAN: 31 mL/min — AB (ref >60)
Glucose, Bld: 252 mg/dL — ABNORMAL HIGH (ref 65–99)
POTASSIUM: 5.7 mmol/L — AB (ref 3.5–5.1)
SODIUM: 136 mmol/L (ref 135–145)

## 2015-04-26 LAB — URINE MICROSCOPIC-ADD ON

## 2015-04-26 LAB — CBC
HEMATOCRIT: 36.8 % (ref 36.0–46.0)
Hemoglobin: 12.2 g/dL (ref 12.0–15.0)
MCH: 29.5 pg (ref 26.0–34.0)
MCHC: 33.2 g/dL (ref 30.0–36.0)
MCV: 89.1 fL (ref 78.0–100.0)
Platelets: 200 10*3/uL (ref 150–400)
RBC: 4.13 MIL/uL (ref 3.87–5.11)
RDW: 12.1 % (ref 11.5–15.5)
WBC: 7.7 10*3/uL (ref 4.0–10.5)

## 2015-04-26 LAB — URINALYSIS, ROUTINE W REFLEX MICROSCOPIC
Bilirubin Urine: NEGATIVE
GLUCOSE, UA: NEGATIVE mg/dL
HGB URINE DIPSTICK: NEGATIVE
Ketones, ur: NEGATIVE mg/dL
Nitrite: NEGATIVE
PH: 6.5 (ref 5.0–8.0)
PROTEIN: NEGATIVE mg/dL
SPECIFIC GRAVITY, URINE: 1.014 (ref 1.005–1.030)

## 2015-04-26 MED ORDER — SODIUM CHLORIDE 0.9 % IV SOLN
INTRAVENOUS | Status: DC
  Administered 2015-04-26: 21:00:00 via INTRAVENOUS

## 2015-04-26 MED ORDER — SODIUM CHLORIDE 0.9 % IV BOLUS (SEPSIS)
500.0000 mL | Freq: Once | INTRAVENOUS | Status: AC
  Administered 2015-04-26: 500 mL via INTRAVENOUS

## 2015-04-26 NOTE — ED Notes (Signed)
Pt reports has UTI for 1 week. Pt c/o back pain and feeling fatigue after taking her medications.

## 2015-04-26 NOTE — Telephone Encounter (Signed)
INTERNAL MEDICINE RESIDENCY PROGRAM After-Hours Telephone Call    Reason for call:   I received a call from [name] on 04/26/2015 at 4:40 PM indicating she would like to know what her lab results are.     Pertinent Data:   Her last clinic visit was 03/26/2015 but patient is currently in the ED waiting room. She states she cannot stay long and has been waiting to be called back to the ED. Per ED RN note she is reports a UTI for 1 week, back pain, and fatigue. She had labs drawn by triage. I explained to patient that I cannot treat patient from the ED and can give her lab results from her clinic visit on 03/26/2015.      Assessment / Plan / Recommendations:   I advised patient to stay in the ED for further treatment as I cannot discuss ED lab results with her since our clinic did not order them. She states she will wait and decide.   As always, pt is advised that if symptoms worsen or new symptoms arise, they should go to an urgent care facility or to to ER for further evaluation.    Norman Herrlich, MD   04/26/2015, 4:40 PM

## 2015-04-27 ENCOUNTER — Other Ambulatory Visit: Payer: Self-pay | Admitting: Advanced Practice Midwife

## 2015-04-27 DIAGNOSIS — R102 Pelvic and perineal pain: Secondary | ICD-10-CM

## 2015-04-27 DIAGNOSIS — E875 Hyperkalemia: Secondary | ICD-10-CM

## 2015-04-27 DIAGNOSIS — N179 Acute kidney failure, unspecified: Secondary | ICD-10-CM

## 2015-04-27 LAB — GLUCOSE, CAPILLARY
GLUCOSE-CAPILLARY: 201 mg/dL — AB (ref 65–99)
Glucose-Capillary: 191 mg/dL — ABNORMAL HIGH (ref 65–99)
Glucose-Capillary: 148 mg/dL — ABNORMAL HIGH (ref 65–99)

## 2015-04-27 LAB — BASIC METABOLIC PANEL
Anion gap: 8 (ref 5–15)
BUN: 49 mg/dL — AB (ref 6–20)
CHLORIDE: 105 mmol/L (ref 101–111)
CO2: 27 mmol/L (ref 22–32)
CREATININE: 1.66 mg/dL — AB (ref 0.44–1.00)
Calcium: 9.2 mg/dL (ref 8.9–10.3)
GFR calc Af Amer: 38 mL/min — ABNORMAL LOW (ref >60)
GFR calc non Af Amer: 33 mL/min — ABNORMAL LOW (ref >60)
Glucose, Bld: 192 mg/dL — ABNORMAL HIGH (ref 65–99)
Potassium: 4.1 mmol/L (ref 3.5–5.1)
Sodium: 140 mmol/L (ref 135–145)

## 2015-04-27 MED ORDER — SODIUM CHLORIDE 0.9 % IV BOLUS (SEPSIS)
1000.0000 mL | Freq: Once | INTRAVENOUS | Status: AC
  Administered 2015-04-27: 1000 mL via INTRAVENOUS

## 2015-04-27 MED ORDER — GABAPENTIN 300 MG PO CAPS
300.0000 mg | ORAL_CAPSULE | Freq: Two times a day (BID) | ORAL | Status: DC
  Administered 2015-04-27: 300 mg via ORAL
  Filled 2015-04-27: qty 1

## 2015-04-27 MED ORDER — INSULIN GLARGINE 100 UNIT/ML ~~LOC~~ SOLN
15.0000 [IU] | Freq: Every day | SUBCUTANEOUS | Status: DC
  Administered 2015-04-27: 15 [IU] via SUBCUTANEOUS
  Filled 2015-04-27 (×2): qty 0.15

## 2015-04-27 MED ORDER — HEPARIN SODIUM (PORCINE) 5000 UNIT/ML IJ SOLN
5000.0000 [IU] | Freq: Three times a day (TID) | INTRAMUSCULAR | Status: DC
  Filled 2015-04-27: qty 1

## 2015-04-27 MED ORDER — INSULIN GLARGINE 100 UNIT/ML ~~LOC~~ SOLN: 15.0000 [IU] | Freq: Every day | SUBCUTANEOUS | Status: DC

## 2015-04-27 NOTE — Progress Notes (Signed)
Dr. Charlynn Grimes notified of cbg of 201 this a.m. He stated that MD will address cbg this morning. Pt will probably be discharged to home today.

## 2015-04-27 NOTE — H&P (Signed)
Date: 04/27/2015               Patient Name:  [name] MRN: [id number]  DOB: [birth year] Age / Sex: 60 y.o., female   PCP: Maria Rude, MD         Medical Service: Internal Medicine Teaching Service         Attending Physician: Dr. Fredia Sorrow, MD    First Contact: Dr. Blane Ohara Pager: [phone]  Second Contact: Dr. Julious Oka Pager: [phone]       After Hours (After 5p/  First Contact Pager: [phone]  weekends / holidays): Second Contact Pager: [phone]   Chief Complaint: Fatigue/Tired  History of Present Illness: [name] is a 60 year old female with a past medical history of diabetes complicated by stage III CKD, HTN, HLD, recurrent UTIs, depression and anxiety presents to the ED today complaining of fatigue and feeling tired. Patient cannot tell me exactly why she came in to the ED today. She states that has been feeling more tired and fatigued that usual and has been sleeping more than normal for her. She denies any specific complaints to me.  The ED reported that she was complaining of having a UTI. UA was unremarkable but Cr was elevated with hyperkalemia present. Minimal improvement after 1L NS bolus and we were called to admit.  Patient does note that she was seen in the ED on 3/14 with complaints of Left Hip Pain and UTI symptoms. She was started on Keflex and Mobic at that time. She reports taking 4 days of the Keflex and Mobic. She denies any urinary symptoms today, no fevers or chills, abdominal pain, nausea/vomiting, diarrhea/constipation, cough, shortness of breath. Reports good PO intake   Meds: Current Facility-Administered Medications  Medication Dose Route Frequency Provider Last Rate Last Dose  . 0.9 %  sodium chloride infusion   Intravenous Continuous Fredia Sorrow, MD 100 mL/hr at 04/26/15 2111     Current Outpatient Prescriptions  Medication Sig Dispense Refill  . acetaminophen (TYLENOL) 650 MG CR tablet Take 650 mg by mouth every 8 (eight)  hours as needed for pain.    . cephALEXin (KEFLEX) 500 MG capsule Take 1 capsule (500 mg total) by mouth 4 (four) times daily. Take all of medicine and drink lots of fluids 20 capsule 0  . enalapril (VASOTEC) 10 MG tablet Take 1 tablet (10 mg total) by mouth daily. 30 tablet 6  . gabapentin (NEURONTIN) 300 MG capsule Take 300 mg by mouth 2 (two) times daily.    . Insulin Glargine (LANTUS SOLOSTAR) 100 UNIT/ML Solostar Pen Inject 20 Units into the skin daily at 10 pm. 15 mL 11  . simvastatin (ZOCOR) 40 MG tablet TAKE 1 TABLET (40 MG TOTAL) BY MOUTH AT BEDTIME. (Patient not taking: Reported on 12/31/2014) 30 tablet 5  . [DISCONTINUED] pantoprazole (PROTONIX) 20 MG tablet Take 2 tablets (40 mg total) by mouth daily. 30 tablet 1  . [DISCONTINUED] sertraline (ZOLOFT) 100 MG tablet Take 1 tablet (100 mg total) by mouth daily. 30 tablet 2    Allergies: Allergies as of 04/26/2015  . (No Known Allergies)   Past Medical History  Diagnosis Date  . Diabetes mellitus type II, uncontrolled ([place])   . Hypertension   . Hyperlipidemia   . Ovarian cyst, left   . Anemia     due to menorrhagia, BL 8-10  . Vaginal cyst     nabothian and bartholin  . Anxiety   . Depression   .  Postmenopausal bleeding 06/12/2008  . 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 UTI (lower urinary tract infection)   . IBS (irritable bowel syndrome)   . Chronic back pain   . Chronic abdominal pain   . DDD (degenerative disc disease), lumbar   . Diabetes mellitus without complication ([place])   . History of palpitations     evaluated recently 12'15  . Colitis   . CKD (chronic kidney disease) stage 3, GFR 30-59 ml/min     baseline creatinine 1.4-1.7  . Bilateral renal cysts 01/15/2009    Qualifier: Diagnosis of  By: Tyrell Antonio MD, Jerald Kief     Past Surgical History  Procedure Laterality Date  . Cardiac surgery      to repair congenital defect as a child- 71months old  . Esophagogastroduodenoscopy (egd) with  propofol N/A 10/01/2014    Procedure: ESOPHAGOGASTRODUODENOSCOPY (EGD) WITH PROPOFOL;  Surgeon: Arta Silence, MD;  Location: [facility];  Service: Endoscopy;  Laterality: N/A;  . Colonoscopy with propofol N/A 10/01/2014    Procedure: COLONOSCOPY WITH PROPOFOL;  Surgeon: Arta Silence, MD;  Location: [facility];  Service: Endoscopy;  Laterality: N/A;   Family History  Problem Relation Age of Onset  . Stroke Father   . Heart attack Father     Had MI in his 28s  . Stomach cancer Paternal Grandmother   . Diabetes Maternal Grandmother   . Cerebral palsy Daughter   . Anesthesia problems Neg Hx   . Hypotension Neg Hx   . Malignant hyperthermia Neg Hx   . Pseudochol deficiency Neg Hx    Social History   Social History  . Marital Status: Divorced    Spouse Name: N/A  . Number of Children: 1  . Years of Education: 12th grade   Occupational History  . 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 Smoking status: Never Smoker   . Smokeless tobacco: Never Used  . Alcohol Use: No  . Drug Use: No  . Sexual Activity: No   Other Topics Concern  . 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    Review of Systems: Pertinent items noted in HPI and remainder of comprehensive ROS otherwise negative.  Physical Exam: Blood pressure 158/91, pulse 88, temperature 98 F (36.7 C), temperature source Oral, resp. rate 18, height 5\' 2"  (1.575 m), weight 172 lb 3 oz (78.104 kg), SpO2 97 %. General: alert, well-developed, and cooperative to examination.  Head: normocephalic and atraumatic.  Eyes: vision grossly intact, pupils equal, pupils round, pupils reactive to light, no injection and anicteric.  Mouth: pharynx pink and moist, no erythema, and no exudates.  Neck: supple, full ROM, no thyromegaly, no JVD, and no carotid bruits.  Lungs: normal respiratory effort, no accessory muscle use, normal breath sounds, no crackles, and  no wheezes. Heart: normal rate, regular rhythm, no murmur, no gallop, and no rub.  Abdomen: soft, non-tender, normal bowel sounds, no distention, no guarding, no rebound tenderness. Msk: no joint swelling, no joint warmth, and no redness over joints.  Pulses: 2+ DP/PT pulses bilaterally Extremities: No cyanosis, clubbing, edema Neurologic: alert & oriented X3, cranial nerves II-XII intact, no focal deficits Skin: turgor normal and no rashes.  Psych: anxious mood and affect  Lab results: Basic Metabolic Panel:  Recent Labs  04/26/15 1516 04/26/15 2033  NA 136 138  K 5.7* 5.6*  CL 98*  102  CO2 28  --   GLUCOSE 252* 194*  BUN 54* 67*  CREATININE 1.97* 1.70*  CALCIUM 9.3  --    CBC:  Recent Labs  04/26/15 1516 04/26/15 2033  WBC 7.7  --   HGB 12.2 12.9  HCT 36.8 38.0  MCV 89.1  --   PLT 200  --    Urine Drug Screen: Drugs of Abuse     Component Value Date/Time   LABOPIA NONE DETECTED 09/05/2014 0417   COCAINSCRNUR NONE DETECTED 09/05/2014 0417   LABBENZ NONE DETECTED 09/05/2014 0417   AMPHETMU NONE DETECTED 09/05/2014 0417   THCU NONE DETECTED 09/05/2014 0417   LABBARB NONE DETECTED 09/05/2014 0417   Urinalysis:  Recent Labs  04/26/15 1915  COLORURINE YELLOW  LABSPEC 1.014  PHURINE 6.5  GLUCOSEU NEGATIVE  HGBUR NEGATIVE  BILIRUBINUR NEGATIVE  KETONESUR NEGATIVE  PROTEINUR NEGATIVE  NITRITE NEGATIVE  LEUKOCYTESUR TRACE*   Imaging results:  No results found.  Assessment & Plan by Problem:  AKI on CKD Stage III: Cr 1.97 on arrival to the ED with only slight improvement to 1.7 after 1L NS bolus. Baseline creatinine appears to be around 1.5-1.6. Patient was recently prescribed Mobic for left hip pain in the ED and reports taking this for the past 4 days. This is likely the cause of her elevated Cr. Patient reprots good PO intake over the past several days. Will stop NSAID use and provide patient with information on which medications to avoid in the  future. -NS 100 mL/hr -BMET in am  Hyperkalemia: K 5.7 on admission. Denies any muscle weakness or paralysis, chest pain or palpations.  Likely secondary to Enalapril with concurrent AKI on CKD. Will hold ACE-I and give IVF.  -Will check an EKG, consider calcium gluconate, insulin/D50, Kayexalate if there is evidence of peaked T waves -IVF as above -BMET in am -Hold Enaopril  Hx of Recurrent UTIs: Patient with recurrent UTIs which bring her to the ED quite frequently. She was seen in the ED 3/15 with complaints of UTI and was started on Keflex for a 5 day course (no culture done). She reports completing 4 days of this prescription. UA today with negative nitrite, trace leuks, 6-30 WBC, rare bacteria. She denies any urinary symptoms. Believe she has received adequate treatment for her UTI and do not feel it is necessary to continue antibiotics at this time. She does have a urologist that she has seen for her recurrent UTIs, Dr. Rosana Hoes but it is unclear if she will continue to see him as she reports being unable to pay co-pays. Urged patient to call the clinic when she has UTI symptoms in the future instead of coming to the ED.   HTN: BP stable. On Enalapril 10 mg daily. Will hold given AKI. - Hold Enalapril  DMII: Stable. A1c 8.1 in February 2017. Home regimen is Lantus 20 units qhs. Has previously been on Pioglitazone but has been off this medication for several months due to expense.  - Lantus 15 Units now - SSI  HLD: Patient reports she is not taking any medications for her cholesterol due to cost. She states she wanted to recheck her cholesterol before starting any medications. Simvastatin 40 mg listed on her home medications. This should be $4 at [employer]. Will recommend starting at discharge.  DVT PPx: Heparin  Dispo: Disposition is deferred at this time, awaiting improvement of current medical problems. Anticipated discharge in approximately 1-2 day(s).   The patient does have  a  current PCP (Maria Montey Hora, MD) and does need an [facility] hospital follow-up appointment after discharge.  The patient does not have transportation limitations that hinder transportation to clinic appointments.  Signed: Maryellen Pile, MD 04/27/2015, 12:27 AM

## 2015-04-27 NOTE — Discharge Instructions (Signed)
Mrs. Lacsamana,  Please do not take any more antibiotics for now. We won't prescribe any more antibiotics here since we've checked your urine and you don't have a urinary tract infection. Also, do NOT take Mobic anymore for pain, as it may be hurting your kidneys. Continue to take Tylenol, but make sure you don't take more than 3000mg  in one day, or more than 1000mg  at one time.  Thanks, [employer]

## 2015-04-27 NOTE — Progress Notes (Signed)
   Subjective: [name] had no acute events overnight. She complains of some hip pain which is chronic for years but otherwise denies any symptoms and feels well, wants to go home.  Objective: Vital signs in last 24 hours: Filed Vitals:   04/27/15 0215 04/27/15 0230 04/27/15 0308 04/27/15 0607  BP: 121/80 128/70 150/75 150/88  Pulse: 77 74 77 73  Temp:   97 F (36.1 C) 98 F (36.7 C)  TempSrc:   Oral Oral  Resp: 14 14 18 18   Height:   5\' 3"  (1.6 m)   Weight:   172 lb (78.019 kg)   SpO2: 96% 95% 99% 99%    Gen: Well-appearing, alert and oriented to person, place, and time CV: Normal rate, regular rhythm, no murmurs, rubs, or gallops Pulmonary: Normal effort, CTA bilaterally, no crackles or wheezes Abdominal: Soft, non-tender, non-distended, without rebound, guarding, or masses Extremities: Distal pulses 2+ in upper and lower extremities bilaterally, no tenderness, erythema or edema  Lab Results: Basic Metabolic Panel:  Recent Labs Lab 04/26/15 1516 04/26/15 2033 04/27/15 0315  NA 136 138 140  K 5.7* 5.6* 4.1  CL 98* 102 105  CO2 28  --  27  GLUCOSE 252* 194* 192*  BUN 54* 67* 49*  CREATININE 1.97* 1.70* 1.66*  CALCIUM 9.3  --  9.2   CBC:  Recent Labs Lab 04/26/15 1516 04/26/15 2033  WBC 7.7  --   HGB 12.2 12.9  HCT 36.8 38.0  MCV 89.1  --   PLT 200  --    Urinalysis:  Recent Labs Lab 04/21/15 1825 04/22/15 1845 04/26/15 1915  COLORURINE  --  YELLOW YELLOW  LABSPEC 1.015 1.015 1.014  PHURINE 6.5 5.5 6.5  GLUCOSEU 100* 250* NEGATIVE  HGBUR NEGATIVE TRACE* NEGATIVE  BILIRUBINUR NEGATIVE NEGATIVE NEGATIVE  KETONESUR NEGATIVE NEGATIVE NEGATIVE  PROTEINUR NEGATIVE NEGATIVE NEGATIVE  UROBILINOGEN 0.2  --   --   NITRITE NEGATIVE NEGATIVE NEGATIVE  LEUKOCYTESUR LARGE* MODERATE* TRACE*   Assessment/Plan: 1. AKI on CKD Stage III: Cr 1.97 on arrival to the ED, now reolved. Baseline creatinine appears to be around 1.5-1.6. Patient was recently prescribed  Mobic for left hip pain in the ED and reports taking this for the past 4 days. This is likely the cause of her elevated Cr. -NS 100 mL/hr, will bolus once more this AM -Instructions to hold Mobic  2. Hyperkalemia: K 5.7 on admission, now resolved. Likely secondary to Enalapril with concurrent AKI on CKD.  -IVF as above -Hold Enaopril  Dispo: Disposition is deferred at this time, awaiting improvement of current medical problems.  Anticipated discharge today.  The patient does have a current PCP (Maria Montey Hora, MD) and does need an [facility] hospital follow-up appointment after discharge.  The patient does not have transportation limitations that hinder transportation to clinic appointments.    Norval Gable, MD 04/27/2015, 9:24 AM

## 2015-04-27 NOTE — ED Provider Notes (Signed)
CSN: [id number]     Arrival date & time 04/26/15  1421 History   First MD Initiated Contact with Patient 04/26/15 1816     Chief Complaint  Patient presents with  . Fatigue  . Back Pain     (Consider location/radiation/quality/duration/timing/severity/associated sxs/prior Treatment) Patient is a 60 y.o. female presenting with back pain. The history is provided by the patient.  Back Pain Associated symptoms: dysuria   Associated symptoms: no abdominal pain and no chest pain   Patient seen in urgent care on March 14 with diagnosis of urinary tract infection and started on Keflex. Urinalysis was not sent for culture. Patient feels like she never got better still feels like she has a urinary tract infection. Complaining of low back pain and fatigue denies fevers chest pain abdominal pain nausea or vomiting.  Past Medical History  Diagnosis Date  . Diabetes mellitus type II, uncontrolled ([place])   . Hypertension   . Hyperlipidemia   . Ovarian cyst, left   . Anemia     due to menorrhagia, BL 8-10  . Vaginal cyst     nabothian and bartholin  . Anxiety   . Depression   . Postmenopausal bleeding 06/12/2008  . 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 UTI (lower urinary tract infection)   . IBS (irritable bowel syndrome)   . Chronic back pain   . Chronic abdominal pain   . DDD (degenerative disc disease), lumbar   . Diabetes mellitus without complication ([place])   . History of palpitations     evaluated recently 12'15  . Colitis   . CKD (chronic kidney disease) stage 3, GFR 30-59 ml/min     baseline creatinine 1.4-1.7  . Bilateral renal cysts 01/15/2009    Qualifier: Diagnosis of  By: Tyrell Antonio MD, Jerald Kief     Past Surgical History  Procedure Laterality Date  . Cardiac surgery      to repair congenital defect as a child- 12months old  . Esophagogastroduodenoscopy (egd) with propofol N/A 10/01/2014    Procedure: ESOPHAGOGASTRODUODENOSCOPY (EGD) WITH PROPOFOL;   Surgeon: Arta Silence, MD;  Location: [facility];  Service: Endoscopy;  Laterality: N/A;  . Colonoscopy with propofol N/A 10/01/2014    Procedure: COLONOSCOPY WITH PROPOFOL;  Surgeon: Arta Silence, MD;  Location: [facility];  Service: Endoscopy;  Laterality: N/A;   Family History  Problem Relation Age of Onset  . Stroke Father   . Heart attack Father     Had MI in his 71s  . Stomach cancer Paternal Grandmother   . Diabetes Maternal Grandmother   . Cerebral palsy Daughter   . Anesthesia problems Neg Hx   . Hypotension Neg Hx   . Malignant hyperthermia Neg Hx   . Pseudochol deficiency Neg Hx    Social History  Substance Use Topics  . Smoking status: Never Smoker   . Smokeless tobacco: Never Used  . Alcohol Use: No   OB History    Gravida Para Term Preterm AB TAB SAB Ectopic Multiple Living   3 1 1  2  2   1      Review of Systems  Constitutional: Positive for fatigue.  HENT: Negative for congestion.   Eyes: Negative for visual disturbance.  Respiratory: Negative for shortness of breath.   Cardiovascular: Negative for chest pain.  Gastrointestinal: Negative for nausea, vomiting, abdominal pain and diarrhea.  Genitourinary: Positive for dysuria.  Musculoskeletal: Positive for back pain.  Skin: Negative for rash.  Hematological: Does not bruise/bleed easily.  Psychiatric/Behavioral: Negative for confusion.      Allergies  Review of patient's allergies indicates no known allergies.  Home Medications   Prior to Admission medications   Medication Sig Start Date End Date Taking? Authorizing Provider  acetaminophen (TYLENOL) 650 MG CR tablet Take 650 mg by mouth every 8 (eight) hours as needed for pain.   Yes Historical Provider, MD  cephALEXin (KEFLEX) 500 MG capsule Take 1 capsule (500 mg total) by mouth 4 (four) times daily. Take all of medicine and drink lots of fluids 04/21/15  Yes Billy Fischer, MD  enalapril (VASOTEC) 10 MG tablet Take 1 tablet (10 mg total) by  mouth daily. 06/08/14  Yes Carly Montey Hora, MD  gabapentin (NEURONTIN) 300 MG capsule Take 300 mg by mouth 2 (two) times daily.   Yes Historical Provider, MD  Insulin Glargine (LANTUS SOLOSTAR) 100 UNIT/ML Solostar Pen Inject 20 Units into the skin daily at 10 pm. 03/24/15  Yes Carly J Rivet, MD  simvastatin (ZOCOR) 40 MG tablet TAKE 1 TABLET (40 MG TOTAL) BY MOUTH AT BEDTIME. Patient not taking: Reported on 12/31/2014 11/26/13   [name], MD   BP 158/91 mmHg  Pulse 88  Temp(Src) 98 F (36.7 C) (Oral)  Resp 18  Ht 5\' 2"  (1.575 m)  Wt 78.104 kg  BMI 31.49 kg/m2  SpO2 97% Physical Exam  Constitutional: She is oriented to person, place, and time. She appears well-developed and well-nourished. No distress.  HENT:  Head: Normocephalic and atraumatic.  Mucous membranes dry.  Eyes: EOM are normal. Pupils are equal, round, and reactive to light.  Neck: Normal range of motion. Neck supple.  Cardiovascular: Normal rate, regular rhythm and normal heart sounds.   No murmur heard. Pulmonary/Chest: Effort normal and breath sounds normal. No respiratory distress.  Abdominal: Soft. Bowel sounds are normal. There is no tenderness.  Musculoskeletal: Normal range of motion. She exhibits no tenderness.  Neurological: She is alert and oriented to person, place, and time. No cranial nerve deficit. She exhibits normal muscle tone. Coordination normal.  Skin: Skin is warm. No rash noted.  Nursing note and vitals reviewed.   ED Course  Procedures (including critical care time) Labs Review Labs Reviewed  BASIC METABOLIC PANEL - Abnormal; Notable for the following:    Potassium 5.7 (*)    Chloride 98 (*)    Glucose, Bld 252 (*)    BUN 54 (*)    Creatinine, Ser 1.97 (*)    GFR calc non Af Amer 27 (*)    GFR calc Af Amer 31 (*)    All other components within normal limits  URINALYSIS, ROUTINE W REFLEX MICROSCOPIC (NOT AT [facility]) - Abnormal; Notable for the following:    APPearance CLOUDY (*)     Leukocytes, UA TRACE (*)    All other components within normal limits  URINE MICROSCOPIC-ADD ON - Abnormal; Notable for the following:    Squamous Epithelial / LPF 0-5 (*)    Bacteria, UA RARE (*)    All other components within normal limits  I-STAT CHEM 8, ED - Abnormal; Notable for the following:    Potassium 5.6 (*)    BUN 67 (*)    Creatinine, Ser 1.70 (*)    Glucose, Bld 194 (*)    Calcium, Ion 1.11 (*)    All other components within normal limits  URINE CULTURE  CBC   Results for orders placed or performed during the hospital encounter  of [id number]  Basic metabolic panel  Result Value Ref Range   Sodium 136 135 - 145 mmol/L   Potassium 5.7 (H) 3.5 - 5.1 mmol/L   Chloride 98 (L) 101 - 111 mmol/L   CO2 28 22 - 32 mmol/L   Glucose, Bld 252 (H) 65 - 99 mg/dL   BUN 54 (H) 6 - 20 mg/dL   Creatinine, Ser 1.97 (H) 0.44 - 1.00 mg/dL   Calcium 9.3 8.9 - 10.3 mg/dL   GFR calc non Af Amer 27 (L) >60 mL/min   GFR calc Af Amer 31 (L) >60 mL/min   Anion gap 10 5 - 15  CBC  Result Value Ref Range   WBC 7.7 4.0 - 10.5 K/uL   RBC 4.13 3.87 - 5.11 MIL/uL   Hemoglobin 12.2 12.0 - 15.0 g/dL   HCT 36.8 36.0 - 46.0 %   MCV 89.1 78.0 - 100.0 fL   MCH 29.5 26.0 - 34.0 pg   MCHC 33.2 30.0 - 36.0 g/dL   RDW 12.1 11.5 - 15.5 %   Platelets 200 150 - 400 K/uL  Urinalysis, Routine w reflex microscopic (not at [facility]  Result Value Ref Range   Color, Urine YELLOW YELLOW   APPearance CLOUDY (A) CLEAR   Specific Gravity, Urine 1.014 1.005 - 1.030   pH 6.5 5.0 - 8.0   Glucose, UA NEGATIVE NEGATIVE mg/dL   Hgb urine dipstick NEGATIVE NEGATIVE   Bilirubin Urine NEGATIVE NEGATIVE   Ketones, ur NEGATIVE NEGATIVE mg/dL   Protein, ur NEGATIVE NEGATIVE mg/dL   Nitrite NEGATIVE NEGATIVE   Leukocytes, UA TRACE (A) NEGATIVE  Urine microscopic-add on  Result Value Ref Range   Squamous Epithelial / LPF 0-5 (A) NONE SEEN   WBC, UA 6-30 0 - 5 WBC/hpf   RBC / HPF 0-5 0 - 5 RBC/hpf   Bacteria, UA RARE  (A) NONE SEEN  I-Stat Chem 8, ED  Result Value Ref Range   Sodium 138 135 - 145 mmol/L   Potassium 5.6 (H) 3.5 - 5.1 mmol/L   Chloride 102 101 - 111 mmol/L   BUN 67 (H) 6 - 20 mg/dL   Creatinine, Ser 1.70 (H) 0.44 - 1.00 mg/dL   Glucose, Bld 194 (H) 65 - 99 mg/dL   Calcium, Ion 1.11 (L) 1.12 - 1.23 mmol/L   TCO2 27 0 - 100 mmol/L   Hemoglobin 12.9 12.0 - 15.0 g/dL   HCT 38.0 36.0 - 46.0 %     Imaging Review No results found. I have personally reviewed and evaluated these images and lab results as part of my medical decision-making.   EKG Interpretation None      MDM   Final diagnoses:  Renal insufficiency  Hyperkalemia    Patient with the persistent hyperkalemia and renal insufficiency. Patient came in with concern for urinary tract infection for one week. Patient was started on Keflex. Patient seen in urgent care but urinalysis on that particular day was a little bit softer UTI and culture was not done. Urinalysis here today was negative. But we found the hyperkalemia and the renal insufficiency which was 1 unexpected. Patient clinically seemed a little bit dehydrated was given some fluids and make no change in the potassium levels were below 6 so no acute concerns. Cardiac monitoring had no significant abnormalities. However her BUN actually got worse with the fluids. Discussed with the her admitting team and not she will be admitted for further hydration and recheck of her labs.  Fredia Sorrow, MD 04/27/15 [phone]

## 2015-04-28 LAB — URINE CULTURE

## 2015-04-28 NOTE — Discharge Summary (Signed)
Name: [name] MRN: [id number] DOB: Jul 05, 1955 60 y.o. PCP: Maria Rude, MD  Date of Admission: 04/26/2015  5:43 PM Date of Discharge: 04/28/2015 Attending Physician: No att. providers found  Discharge Diagnosis: 1. AKI with hyperkalemia  Principal Problem:   AKI (acute kidney injury) ([place]) Active Problems:   Diabetes mellitus with stage 3 chronic kidney disease ([place])   Hyperlipidemia associated with type 2 diabetes mellitus ([place])   Recurrent UTI (urinary tract infection)   Hyperkalemia  Discharge Medications:   Medication List    STOP taking these medications        cephALEXin 500 MG capsule  Commonly known as:  KEFLEX      TAKE these medications        acetaminophen 650 MG CR tablet  Commonly known as:  TYLENOL  Take 650 mg by mouth every 8 (eight) hours as needed for pain.     enalapril 10 MG tablet  Commonly known as:  VASOTEC  Take 1 tablet (10 mg total) by mouth daily.     gabapentin 300 MG capsule  Commonly known as:  NEURONTIN  Take 300 mg by mouth 2 (two) times daily.     Insulin Glargine 100 UNIT/ML Solostar Pen  Commonly known as:  LANTUS SOLOSTAR  Inject 20 Units into the skin daily at 10 pm.     simvastatin 40 MG tablet  Commonly known as:  ZOCOR  TAKE 1 TABLET (40 MG TOTAL) BY MOUTH AT BEDTIME.        Disposition and follow-up:   [name] was discharged from [facility] in Good condition.  At the hospital follow up visit please address:  1.  Correct usage of mobic, calling the clinic before coming to the ED for UTIs  2.  Labs / imaging needed at time of follow-up: None  3.  Pending labs/ test needing follow-up: None  Follow-up Appointments:   Discharge Instructions:     Discharge Instructions    Diet - low sodium heart healthy    Complete by:  As directed      Increase activity slowly    Complete by:  As directed            Consultations:  None  Procedures Performed:  No results found.  2D  Echo: None  Cardiac Cath: None  Admission HPI: [name] is a 60 year old female with a past medical history of diabetes complicated by stage III CKD, HTN, HLD, recurrent UTIs, depression and anxiety presents to the ED today complaining of fatigue and feeling tired. Patient cannot tell me exactly why she came in to the ED today. She states that has been feeling more tired and fatigued that usual and has been sleeping more than normal for her. She denies any specific complaints to me.  The ED reported that she was complaining of having a UTI. UA was unremarkable but Cr was elevated with hyperkalemia present. Minimal improvement after 1L NS bolus and we were called to admit.  Patient does note that she was seen in the ED on 3/14 with complaints of Left Hip Pain and UTI symptoms. She was started on Keflex and Mobic at that time. She reports taking 4 days of the Keflex and Mobic. She denies any urinary symptoms today, no fevers or chills, abdominal pain, nausea/vomiting, diarrhea/constipation, cough, shortness of breath. Reports good PO intake   Hospital Course by problem list: Principal Problem:   AKI (acute kidney injury) ([place]) Active Problems:  Diabetes mellitus with stage 3 chronic kidney disease ([facility])   Hyperlipidemia associated with type 2 diabetes mellitus ([place])   Recurrent UTI (urinary tract infection)   Hyperkalemia   1. AKI with hyperkalemia - initially presented to the ED with complaints of feeling tired, excessive fatigue, and sleeping more than normal for her. Labs showed an AKI with Cr ~2 (baseline 1.5 with K of 5.7 without EKG changes. This was thought due to her excessive use of Mobic for chronic hip pain, exacerbated by chronic ACE inhibitor use. ACE inhibitor was held and she was given IV hydration and counseled on not using NSAIDs. She did not have a UTI at this time although recently treated for one, so no antibiotics were given. Repeat labs the next morning showed normalized Cr  and K, and she was deemed appropriate for discharge.  Discharge Vitals:   BP 148/84 mmHg  Pulse 80  Temp(Src) 98.2 F (36.8 C) (Oral)  Resp 15  Ht 5\' 3"  (1.6 m)  Wt 172 lb (78.019 kg)  BMI 30.48 kg/m2  SpO2 98%  Discharge Labs:  Results for orders placed or performed during the hospital encounter of 04/26/15 (from the past 24 hour(s))  Glucose, capillary     Status: Abnormal   Collection Time: 04/27/15 11:50 AM  Result Value Ref Range   Glucose-Capillary 148 (H) 65 - 99 mg/dL    Signed: Norval Gable, MD 04/28/2015, 7:02 AM    Services Ordered on Discharge: None Equipment Ordered on Discharge: None

## 2015-05-05 ENCOUNTER — Ambulatory Visit (HOSPITAL_COMMUNITY): Payer: MEDICAID | Attending: Advanced Practice Midwife

## 2015-05-12 ENCOUNTER — Telehealth: Payer: Self-pay | Admitting: Dietician

## 2015-05-12 NOTE — Telephone Encounter (Signed)
Reminded patient to bring her blood glucose meter.

## 2015-05-13 ENCOUNTER — Encounter: Payer: Self-pay | Admitting: Dietician

## 2015-05-13 ENCOUNTER — Ambulatory Visit (INDEPENDENT_AMBULATORY_CARE_PROVIDER_SITE_OTHER): Payer: Self-pay | Admitting: Dietician

## 2015-05-13 ENCOUNTER — Encounter (HOSPITAL_COMMUNITY): Payer: Self-pay | Admitting: Emergency Medicine

## 2015-05-13 ENCOUNTER — Ambulatory Visit (HOSPITAL_COMMUNITY)
Admission: EM | Admit: 2015-05-13 | Discharge: 2015-05-13 | Disposition: A | Discharge status: Home / Self Care | Payer: Self-pay | Attending: Family Medicine | Admitting: Family Medicine

## 2015-05-13 ENCOUNTER — Ambulatory Visit (INDEPENDENT_AMBULATORY_CARE_PROVIDER_SITE_OTHER): Payer: Self-pay | Admitting: Internal Medicine

## 2015-05-13 ENCOUNTER — Encounter: Payer: Self-pay | Admitting: Internal Medicine

## 2015-05-13 VITALS — BP 142/83 | HR 84 | Temp 98.1°F | Resp 18 | Wt 172.2 lb

## 2015-05-13 DIAGNOSIS — R1084 Generalized abdominal pain: Secondary | ICD-10-CM

## 2015-05-13 DIAGNOSIS — E1122 Type 2 diabetes mellitus with diabetic chronic kidney disease: Secondary | ICD-10-CM

## 2015-05-13 DIAGNOSIS — Z713 Dietary counseling and surveillance: Secondary | ICD-10-CM

## 2015-05-13 DIAGNOSIS — Z794 Long term (current) use of insulin: Secondary | ICD-10-CM

## 2015-05-13 DIAGNOSIS — I10 Essential (primary) hypertension: Principal | ICD-10-CM

## 2015-05-13 DIAGNOSIS — Z6830 Body mass index (BMI) 30.0-30.9, adult: Secondary | ICD-10-CM

## 2015-05-13 DIAGNOSIS — I129 Hypertensive chronic kidney disease with stage 1 through stage 4 chronic kidney disease, or unspecified chronic kidney disease: Secondary | ICD-10-CM

## 2015-05-13 DIAGNOSIS — N183 Chronic kidney disease, stage 3 unspecified: Secondary | ICD-10-CM

## 2015-05-13 DIAGNOSIS — E1159 Type 2 diabetes mellitus with other circulatory complications: Secondary | ICD-10-CM

## 2015-05-13 DIAGNOSIS — E119 Type 2 diabetes mellitus without complications: Secondary | ICD-10-CM

## 2015-05-13 LAB — POCT URINALYSIS DIP (DEVICE)
Bilirubin Urine: NEGATIVE
GLUCOSE, UA: NEGATIVE mg/dL
HGB URINE DIPSTICK: NEGATIVE
Ketones, ur: NEGATIVE mg/dL
NITRITE: NEGATIVE
PROTEIN: NEGATIVE mg/dL
Specific Gravity, Urine: 1.01 (ref 1.005–1.030)
UROBILINOGEN UA: 0.2 mg/dL (ref 0.0–1.0)
pH: 6 (ref 5.0–8.0)

## 2015-05-13 MED ORDER — PIOGLITAZONE HCL 15 MG PO TABS: 15.0000 mg | ORAL_TABLET | Freq: Every day | ORAL | Status: DC

## 2015-05-13 NOTE — Progress Notes (Signed)
Patient ID: [name], female   DOB: [birth year], 60 y.o.   MRN: [id number]     Subjective:   Patient ID: [name] female    DOB: Dec 18, 1955 60 y.o.    MRN: [id number] Health Maintenance Due: Health Maintenance Due  Topic Date Due  . Hepatitis C Screening  Nov 23, 1955  . HIV Screening  04/21/1972    _________________________________________________  HPI: [name] is a 60 y.o. female here for a f/u of DM.  Pt has a PMH outlined below.  Please see problem-based charting assessment and plan for further status of patient's chronic medical problems addressed at today's visit.  PMH: Past Medical History  Diagnosis Date  . Diabetes mellitus type II, uncontrolled ([place])   . Hypertension   . Hyperlipidemia   . Ovarian cyst, left   . Anemia     due to menorrhagia, BL 8-10  . Vaginal cyst     nabothian and bartholin  . Anxiety   . Depression   . Postmenopausal bleeding 06/12/2008  . 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 UTI (lower urinary tract infection)   . IBS (irritable bowel syndrome)   . Chronic back pain   . Chronic abdominal pain   . DDD (degenerative disc disease), lumbar   . Diabetes mellitus without complication ([place])   . History of palpitations     evaluated recently 12'15  . Colitis   . CKD (chronic kidney disease) stage 3, GFR 30-59 ml/min     baseline creatinine 1.4-1.7  . Bilateral renal cysts 01/15/2009    Qualifier: Diagnosis of  By: Tyrell Antonio MD, [place]      Medications: Current Outpatient Prescriptions on File Prior to Visit  Medication Sig Dispense Refill  . acetaminophen (TYLENOL) 650 MG CR tablet Take 1,300 mg by mouth every 8 (eight) hours as needed for pain (takes twice daily).     . enalapril (VASOTEC) 10 MG tablet Take 1 tablet (10 mg total) by mouth daily. (Patient taking differently: Take 20 mg by mouth daily. ) 30 tablet 6  . gabapentin (NEURONTIN) 300 MG capsule Take 300 mg by mouth 2 (two) times daily.    .  Insulin Glargine (LANTUS SOLOSTAR) 100 UNIT/ML Solostar Pen Inject 20 Units into the skin daily at 10 pm. 15 mL 11  . simvastatin (ZOCOR) 40 MG tablet TAKE 1 TABLET (40 MG TOTAL) BY MOUTH AT BEDTIME. (Patient not taking: Reported on 12/31/2014) 30 tablet 5  . [DISCONTINUED] pantoprazole (PROTONIX) 20 MG tablet Take 2 tablets (40 mg total) by mouth daily. 30 tablet 1  . [DISCONTINUED] sertraline (ZOLOFT) 100 MG tablet Take 1 tablet (100 mg total) by mouth daily. 30 tablet 2   No current facility-administered medications on file prior to visit.    Allergies: No Known Allergies  FH: Family History  Problem Relation Age of Onset  . Stroke Father   . Heart attack Father     Had MI in his 74s  . Stomach cancer Paternal Grandmother   . Diabetes Maternal Grandmother   . Cerebral palsy Daughter   . Anesthesia problems Neg Hx   . Hypotension Neg Hx   . Malignant hyperthermia Neg Hx   . Pseudochol deficiency Neg Hx     SH: Social History   Social History  . Marital Status: Divorced    Spouse Name: N/A  . Number of Children: 1  . Years of Education: 12th grade   Occupational History  . 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 Smoking status: Never Smoker   . Smokeless tobacco: Never Used  . Alcohol Use: No  . Drug Use: No  . Sexual Activity: No   Other Topics Concern  . 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    Review of Systems: Constitutional: Negative for fever, chills and weight loss.  Eyes: Negative for blurred vision.  Respiratory: Negative for cough.  Cardiovascular: Negative for chest pain, palpitations and leg swelling.  Musculoskeletal: Negative for myalgias and +back pain.  Neurological: Negative for dizziness, weakness and headaches.     Objective:   Vital Signs: Filed Vitals:   05/13/15 0826  BP: 142/83  Pulse: 84  Temp: 98.1 F (36.7 C)  TempSrc: Oral  Resp: 18  Weight: 172 lb  3.2 oz (78.109 kg)  SpO2: 100%      BP Readings from Last 3 Encounters:  05/13/15 142/83  04/27/15 148/84  04/22/15 117/57    Physical Exam: Constitutional: Vital signs reviewed.  Patient is in NAD and cooperative with exam.  Head: Normocephalic and atraumatic. Eyes: EOMI, conjunctivae nl, no scleral icterus.  Neck: Supple. Cardiovascular: RRR, no MRG. Pulmonary/Chest: normal effort, CTAB, no wheezes, rales, or rhonchi. Neurological: A&O x3, cranial nerves II-XII are grossly intact, moving all extremities. Extremities: No LE edema. Skin: Warm, dry and intact. No rash.   Assessment & Plan:   Assessment and plan was discussed and formulated with my attending.

## 2015-05-13 NOTE — ED Notes (Signed)
The patient presented to the [facility] with a complaint of abdominal pain that she believed to possibly be a UTI.

## 2015-05-13 NOTE — Assessment & Plan Note (Signed)
BP 142/39. -cont current meds

## 2015-05-13 NOTE — Discharge Instructions (Signed)
It is a pleasure to see you today.  I am glad your abdominal pain has gone away.   The urine today does not look like a urinary tract infection.   Please follow up with your primary doctor if the symptoms return.

## 2015-05-13 NOTE — Assessment & Plan Note (Signed)
Pt has not been checking blood sugars consistently but states that fasting cbg is usually around 80.  She reports the highest it has been is <200.  She is currently on lantus 20 units and has not been taking actos due to cost.   -cont lantus 20 units -resume actos 15mg  qd -f/u HA1c 5/14

## 2015-05-13 NOTE — Progress Notes (Signed)
  Medical Nutrition Therapy:  Appt start time: 0906  end time:  0940 Visit # 1 Assessment:  Primary concerns today: blood sugar control  Patient says when she eats at home is is often healthier such as lite yogurt,  fruits, vegetables, but she eats out often because she is always on the go.  She is concerned about her A1C because she hopes it will improve her neuropathy. She is also concerned about her weight because of her knee and ankle problems  Preferred Learning Style: No preference indicated  Learning Readiness: Contemplating   ANTHROPOMETRICS: weight-172#,  Height-? , BMI-30.5- has gained 5# in past 4-5 months WEIGHT HISTORY:not discussed today SLEEP:not addressed today MEDICATIONS: lantus 20 units daily BLOOD SUGAR: only one in past month- it was in the 120s DIETARY INTAKE: Usual eating pattern includes 3 meals and 1-2 snacks per day. 24-hr recall:  B ( 11 AM): egg mcmuffin and diet soda or at home- [place] bacon and a fruit  L ( PM): often fats food- cheeseburger, fries and diet soda Snk ( PM): trail mix D ( PM): stouffer's lasagna, vegetables, diet soda o water Snk ( PM): yogurt, chips, no-bake cookie,  Beverages: diet soda or water Usual physical activity: always busy running around with daughter, but no formal planned activity   Progress Towards Goal(s):  In progress.   Nutritional Diagnosis:  [place]-2.2 Altered nutrition-related laboratory As related to higher A1C than her goal.  As evidenced by her a1c>8%.    Intervention:  Nutrition counseling about what is keeping her from improving her A1C and making healthier food choices. She agreed to try to eat out less and try to make healthier choices when she does eat out. Education about actions of diabetes medicine and the need for more than 1 type Coordination of care- patient does not check blood sugars but one time a month- may do better with pills that do not cause low blood sugar for prandial coverage for safety  Teaching  Method Utilized: Visual, Auditory Handouts given during visit include: Barriers to learning/adherence to lifestyle change: lack of support to make healthier choices Demonstrated degree of understanding via:  Teach Back   Monitoring/Evaluation:  Dietary intake, exercise,meter, and body weight in 2 week(s).

## 2015-05-13 NOTE — Patient Instructions (Signed)
Hello [name],  It was nice meeting wit you today. I recommend we meet at least two times a year. Also it is recommend that we Try to eat more...  1. Whole grains: whole wheat cereal, crackers, bread, pasta, old fashioned oats & brown rice 2. Whole fruits-1 cup a day 3. Vegetables- 2-3 cups a day 4. Lowfat Protein: Chicken, [place], lean cuts of beef and pork, fish 2-3 times a week 5. Nuts, Seeds and Soy:add walnuts to cereal, peanut butter sandwich  Eat Less... 1. Saturated & Transfats- mostly from red meat, high fat dairy like milk, ice cream, coffee creamer, snack foods like chips, pork rind, fried foods 2. Added Sugar: artifical sweeteners can help 3. Sodium:Use spices instead of salt, avoid salty foods like soup, crackers, chips, lunch meat, sausage, hot dogs

## 2015-05-13 NOTE — Progress Notes (Signed)
Internal Medicine Clinic Attending  Case discussed with Dr. Gill at the time of the visit.  We reviewed the resident's history and exam and pertinent patient test results.  I agree with the assessment, diagnosis, and plan of care documented in the resident's note.  

## 2015-05-13 NOTE — Patient Instructions (Signed)
Thank you for your visit today.   Please return to the internal medicine clinic in May or sooner if needed.     I have made the following additions/changes to your medications:  I have sent a refill for actos to your pharmacy, please take 1 tablet daily. Please continue lantus 20 units daily. Please remember to check your blood sugars at least before breakfast daily.   Please be sure to bring all of your medications with you to every visit; this includes herbal supplements, vitamins, eye drops, and any over-the-counter medications.   Should you have any questions regarding your medications and/or any new or worsening symptoms, please be sure to call the clinic at [phone].   If you believe that you are suffering from a life threatening condition or one that may result in the loss of limb or function, then you should call 911 and proceed to the nearest Emergency Department.   A healthy lifestyle and preventative care can promote health and wellness.   Maintain regular health, dental, and eye exams.  Eat a healthy diet. Foods like vegetables, fruits, whole grains, low-fat dairy products, and lean protein foods contain the nutrients you need without too many calories. Decrease your intake of foods high in solid fats, added sugars, and salt. Get information about a proper diet from your caregiver, if necessary.  Regular physical exercise is one of the most important things you can do for your health. Most adults should get at least 150 minutes of moderate-intensity exercise (any activity that increases your heart rate and causes you to sweat) each week. In addition, most adults need muscle-strengthening exercises on 2 or more days a week.   Maintain a healthy weight. The body mass index (BMI) is a screening tool to identify possible weight problems. It provides an estimate of body fat based on height and weight. Your caregiver can help determine your BMI, and can help you achieve or  maintain a healthy weight. For adults 20 years and older:  A BMI below 18.5 is considered underweight.  A BMI of 18.5 to 24.9 is normal.  A BMI of 25 to 29.9 is considered overweight.  A BMI of 30 and above is considered obese.

## 2015-05-13 NOTE — ED Provider Notes (Addendum)
CSN: [id number]     Arrival date & time 05/13/15  1536 History   First MD Initiated Contact with Patient 05/13/15 1809     Chief Complaint  Patient presents with  . Abdominal Pain   (Consider location/radiation/quality/duration/timing/severity/associated sxs/prior Treatment) Patient is a 60 y.o. female presenting with abdominal pain. The history is provided by the patient. No language interpreter was used.  Abdominal Pain Associated symptoms: no chills, no dysuria, no fatigue, no fever, no hematuria, no vaginal bleeding and no vaginal discharge    Patient presents for complaint of "warm feeling running across my abdomen" that comes and goes intermittently.  She was seen by her primary doctor in Internal Medicine clinic this morning, however she did not mention this to her doctor then. Has been ongoing for some time.  She reports that she has no pain or discomfort at this time.  She is concerned that she may have a urinary tract infection.  Denies dysuria, frank hematuria, urinary frequency or urgency.  She reports her last cystitis was 3 weeks ago.  She has been seen by urology in the past for recurrent urinary tract infections.   She denies fevers or chills, no diaphoresis, no N/V. She has two formed bowel movements daily.   Past Medical History  Diagnosis Date  . Diabetes mellitus type II, uncontrolled ([place])   . Hypertension   . Hyperlipidemia   . Ovarian cyst, left   . Anemia     due to menorrhagia, BL 8-10  . Vaginal cyst     nabothian and bartholin  . Anxiety   . Depression   . Postmenopausal bleeding 06/12/2008  . 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 UTI (lower urinary tract infection)   . IBS (irritable bowel syndrome)   . Chronic back pain   . Chronic abdominal pain   . DDD (degenerative disc disease), lumbar   . Diabetes mellitus without complication ([place])   . History of palpitations     evaluated recently 12'15  . Colitis   . CKD (chronic kidney  disease) stage 3, GFR 30-59 ml/min     baseline creatinine 1.4-1.7  . Bilateral renal cysts 01/15/2009    Qualifier: Diagnosis of  By: Tyrell Antonio MD, Jerald Kief     Past Surgical History  Procedure Laterality Date  . Cardiac surgery      to repair congenital defect as a child- 46months old  . Esophagogastroduodenoscopy (egd) with propofol N/A 10/01/2014    Procedure: ESOPHAGOGASTRODUODENOSCOPY (EGD) WITH PROPOFOL;  Surgeon: Arta Silence, MD;  Location: [facility] ENDOSCOPY;  Service: Endoscopy;  Laterality: N/A;  . Colonoscopy with propofol N/A 10/01/2014    Procedure: COLONOSCOPY WITH PROPOFOL;  Surgeon: Arta Silence, MD;  Location: [facility] ENDOSCOPY;  Service: Endoscopy;  Laterality: N/A;   Family History  Problem Relation Age of Onset  . Stroke Father   . Heart attack Father     Had MI in his 61s  . Stomach cancer Paternal Grandmother   . Diabetes Maternal Grandmother   . Cerebral palsy Daughter   . Anesthesia problems Neg Hx   . Hypotension Neg Hx   . Malignant hyperthermia Neg Hx   . Pseudochol deficiency Neg Hx    Social History  Substance Use Topics  . Smoking status: Never Smoker   . Smokeless tobacco: Never Used  . Alcohol Use: No   OB History    Gravida Para Term Preterm AB TAB SAB Ectopic Multiple Living   3  1 1  2  2   1      Review of Systems  Constitutional: Negative for fever, chills, diaphoresis, appetite change and fatigue.  Gastrointestinal: Positive for abdominal pain.  Genitourinary: Negative for dysuria, urgency, hematuria, flank pain, vaginal bleeding, vaginal discharge and difficulty urinating.    Allergies  Review of patient's allergies indicates no known allergies.  Home Medications   Prior to Admission medications   Medication Sig Start Date End Date Taking? Authorizing Provider  acetaminophen (TYLENOL) 650 MG CR tablet Take 1,300 mg by mouth every 8 (eight) hours as needed for pain (takes twice daily).    Yes Historical Provider, MD  enalapril (VASOTEC) 10  MG tablet Take 1 tablet (10 mg total) by mouth daily. Patient taking differently: Take 20 mg by mouth daily.  06/08/14  Yes Carly Montey Hora, MD  gabapentin (NEURONTIN) 300 MG capsule Take 300 mg by mouth 2 (two) times daily.   Yes Historical Provider, MD  Insulin Glargine (LANTUS SOLOSTAR) 100 UNIT/ML Solostar Pen Inject 20 Units into the skin daily at 10 pm. 03/24/15  Yes Carly J Rivet, MD  pioglitazone (ACTOS) 15 MG tablet Take 1 tablet (15 mg total) by mouth daily. 05/13/15  Yes Jones Bales, MD  simvastatin (ZOCOR) 40 MG tablet TAKE 1 TABLET (40 MG TOTAL) BY MOUTH AT BEDTIME. 11/26/13  Yes Ejiroghene Arlyce Dice, MD   Meds Ordered and Administered this Visit  Medications - No data to display  BP 168/95 mmHg  Pulse 84  Temp(Src) 98.3 F (36.8 C) (Oral)  Resp 16  SpO2 99% No data found.   Physical Exam  Constitutional: She appears well-developed and well-nourished. No distress.  HENT:  Head: Normocephalic.  Neck: Normal range of motion. Neck supple.  Cardiovascular: Normal rate, regular rhythm and normal heart sounds.   Pulmonary/Chest: Effort normal and breath sounds normal. No respiratory distress. She has no wheezes. She has no rales. She exhibits no tenderness.  Abdominal: Soft. Bowel sounds are normal. She exhibits no distension and no mass. There is no tenderness. There is no rebound and no guarding.  No suprapubic tenderness, no CVA tenderness.   Skin: She is not diaphoretic.    ED Course  Procedures (including critical care time)  Labs Review Labs Reviewed  POCT URINALYSIS DIP (DEVICE) - Abnormal; Notable for the following:    Leukocytes, UA LARGE (*)    All other components within normal limits    Imaging Review No results found.   Visual Acuity Review  Right Eye Distance:   Left Eye Distance:   Bilateral Distance:    Right Eye Near:   Left Eye Near:    Bilateral Near:         MDM   1. Generalized abdominal pain    Patient with intermittent abd  discomfort "feels like warm sensation across abdomen", resolved now. UA dipstick only shows leuks.  No other urinary sxs. Encouraged to follow up with her primary physician if the symptoms recur.  No further studies sent on urine specimen given unremarkable hx and exam today.   Dalbert Mayotte, MD    Willeen Niece, MD 05/13/15 [place], MD 05/13/15 [place], MD 05/13/15 [phone]

## 2015-05-18 ENCOUNTER — Inpatient Hospital Stay (HOSPITAL_COMMUNITY)
Admission: AD | Admit: 2015-05-18 | Discharge: 2015-05-18 | Disposition: A | Discharge status: Home / Self Care | Payer: Medicaid Other | Source: Ambulatory Visit | Attending: Obstetrics & Gynecology | Admitting: Obstetrics & Gynecology

## 2015-05-18 ENCOUNTER — Encounter (HOSPITAL_COMMUNITY): Payer: Self-pay | Admitting: *Deleted

## 2015-05-18 ENCOUNTER — Telehealth: Payer: Self-pay | Admitting: Internal Medicine

## 2015-05-18 DIAGNOSIS — N281 Cyst of kidney, acquired: Secondary | ICD-10-CM | POA: Diagnosis not present

## 2015-05-18 DIAGNOSIS — M5136 Other intervertebral disc degeneration, lumbar region: Secondary | ICD-10-CM | POA: Insufficient documentation

## 2015-05-18 DIAGNOSIS — K589 Irritable bowel syndrome without diarrhea: Secondary | ICD-10-CM | POA: Insufficient documentation

## 2015-05-18 DIAGNOSIS — E785 Hyperlipidemia, unspecified: Secondary | ICD-10-CM | POA: Insufficient documentation

## 2015-05-18 DIAGNOSIS — N390 Urinary tract infection, site not specified: Secondary | ICD-10-CM | POA: Diagnosis not present

## 2015-05-18 DIAGNOSIS — N83292 Other ovarian cyst, left side: Secondary | ICD-10-CM | POA: Insufficient documentation

## 2015-05-18 DIAGNOSIS — M549 Dorsalgia, unspecified: Secondary | ICD-10-CM | POA: Insufficient documentation

## 2015-05-18 DIAGNOSIS — E119 Type 2 diabetes mellitus without complications: Secondary | ICD-10-CM | POA: Insufficient documentation

## 2015-05-18 DIAGNOSIS — F329 Major depressive disorder, single episode, unspecified: Secondary | ICD-10-CM | POA: Diagnosis not present

## 2015-05-18 DIAGNOSIS — R109 Unspecified abdominal pain: Secondary | ICD-10-CM | POA: Diagnosis present

## 2015-05-18 DIAGNOSIS — I129 Hypertensive chronic kidney disease with stage 1 through stage 4 chronic kidney disease, or unspecified chronic kidney disease: Secondary | ICD-10-CM | POA: Diagnosis not present

## 2015-05-18 DIAGNOSIS — F419 Anxiety disorder, unspecified: Secondary | ICD-10-CM | POA: Insufficient documentation

## 2015-05-18 DIAGNOSIS — G8929 Other chronic pain: Secondary | ICD-10-CM | POA: Diagnosis not present

## 2015-05-18 LAB — CBC
HCT: 34.7 % — ABNORMAL LOW (ref 36.0–46.0)
HEMOGLOBIN: 11.9 g/dL — AB (ref 12.0–15.0)
MCH: 30.3 pg (ref 26.0–34.0)
MCHC: 34.3 g/dL (ref 30.0–36.0)
MCV: 88.3 fL (ref 78.0–100.0)
Platelets: 203 10*3/uL (ref 150–400)
RBC: 3.93 MIL/uL (ref 3.87–5.11)
RDW: 12.5 % (ref 11.5–15.5)
WBC: 7.9 10*3/uL (ref 4.0–10.5)

## 2015-05-18 LAB — COMPREHENSIVE METABOLIC PANEL
ALBUMIN: 4.3 g/dL (ref 3.5–5.0)
ALK PHOS: 125 U/L (ref 38–126)
ALT: 27 U/L (ref 14–54)
ANION GAP: 6 (ref 5–15)
AST: 38 U/L (ref 15–41)
BUN: 44 mg/dL — AB (ref 6–20)
CALCIUM: 9 mg/dL (ref 8.9–10.3)
CO2: 28 mmol/L (ref 22–32)
CREATININE: 1.57 mg/dL — AB (ref 0.44–1.00)
Chloride: 99 mmol/L — ABNORMAL LOW (ref 101–111)
GFR calc Af Amer: 41 mL/min — ABNORMAL LOW (ref >60)
GFR calc non Af Amer: 35 mL/min — ABNORMAL LOW (ref >60)
GLUCOSE: 286 mg/dL — AB (ref 65–99)
Potassium: 4.2 mmol/L (ref 3.5–5.1)
SODIUM: 133 mmol/L — AB (ref 135–145)
Total Bilirubin: 0.6 mg/dL (ref 0.3–1.2)
Total Protein: 7.4 g/dL (ref 6.5–8.1)

## 2015-05-18 LAB — URINALYSIS, ROUTINE W REFLEX MICROSCOPIC
Bilirubin Urine: NEGATIVE
GLUCOSE, UA: 100 mg/dL — AB
HGB URINE DIPSTICK: NEGATIVE
Ketones, ur: NEGATIVE mg/dL
Nitrite: NEGATIVE
PH: 5 (ref 5.0–8.0)
Protein, ur: NEGATIVE mg/dL
Specific Gravity, Urine: 1.015 (ref 1.005–1.030)

## 2015-05-18 LAB — URINE MICROSCOPIC-ADD ON

## 2015-05-18 MED ORDER — NITROFURANTOIN MONOHYD MACRO 100 MG PO CAPS: 100.0000 mg | ORAL_CAPSULE | Freq: Two times a day (BID) | ORAL | Status: DC

## 2015-05-18 NOTE — Discharge Instructions (Signed)
Asymptomatic Bacteriuria, Female Asymptomatic bacteriuria is the presence of a large number of bacteria in your urine without the usual symptoms of burning or frequent urination. The following conditions increase the risk of asymptomatic bacteriuria:  Diabetes mellitus.  Advanced age.  Pregnancy in the first trimester.  Kidney stones.  Kidney transplants.  Leaky kidney tube valve in young children (reflux). Treatment for this condition is not needed in most people and can lead to other problems such as too much yeast and growth of resistant bacteria. However, some people, such as pregnant women, do need treatment to prevent kidney infection. Asymptomatic bacteriuria in pregnancy is also associated with fetal growth restriction, premature labor, and newborn death. HOME CARE INSTRUCTIONS Monitor your condition for any changes. The following actions may help to relieve any discomfort you are feeling:  Drink enough water and fluids to keep your urine clear or pale yellow. Go to the bathroom more often to keep your bladder empty.  Keep the area around your vagina and rectum clean. Wipe yourself from front to back after urinating. SEEK IMMEDIATE MEDICAL CARE IF:  You develop signs of an infection such as:  Burning with urination.  Frequency of voiding.  Back pain.  Fever.  You have blood in the urine.  You develop a fever. MAKE SURE YOU:  Understand these instructions.  Will watch your condition.  Will get help right away if you are not doing well or get worse.   This information is not intended to replace advice given to you by your health care provider. Make sure you discuss any questions you have with your health care provider.   Document Released: 01/24/2005 Document Revised: 02/14/2014 Document Reviewed: 07/16/2012 Elsevier Interactive Patient Education 2016 [employer]  

## 2015-05-18 NOTE — Telephone Encounter (Signed)
APPT. REMINDER CALL, LMTCB °

## 2015-05-18 NOTE — MAU Note (Signed)
Urine in lab 

## 2015-05-18 NOTE — MAU Provider Note (Signed)
History                                         Up all night going to the bathroom. Feels a warm feeling in RLQ, hurts at time. Tylenol seems to ease it. States hx of freq UTI's and sure this is one.           CSN: [id number]  Arrival date & time 05/18/15  1735   First Provider Initiated Contact with Patient 05/18/15 2033      Chief Complaint  Patient presents with  . Abdominal Pain    HPI  Past Medical History  Diagnosis Date  . Diabetes mellitus type II, uncontrolled ([place])   . Hypertension   . Hyperlipidemia   . Ovarian cyst, left   . Anemia     due to menorrhagia, BL 8-10  . Vaginal cyst     nabothian and bartholin  . Anxiety   . Depression   . Postmenopausal bleeding 06/12/2008  . 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 UTI (lower urinary tract infection)   . IBS (irritable bowel syndrome)   . Chronic back pain   . Chronic abdominal pain   . DDD (degenerative disc disease), lumbar   . Diabetes mellitus without complication ([place])   . History of palpitations     evaluated recently 12'15  . Colitis   . CKD (chronic kidney disease) stage 3, GFR 30-59 ml/min     baseline creatinine 1.4-1.7  . Bilateral renal cysts 01/15/2009    Qualifier: Diagnosis of  By: Tyrell Antonio MD, Jerald Kief      Past Surgical History  Procedure Laterality Date  . Cardiac surgery      to repair congenital defect as a child- 23months old  . Esophagogastroduodenoscopy (egd) with propofol N/A 10/01/2014    Procedure: ESOPHAGOGASTRODUODENOSCOPY (EGD) WITH PROPOFOL;  Surgeon: Arta Silence, MD;  Location: [facility];  Service: Endoscopy;  Laterality: N/A;  . Colonoscopy with propofol N/A 10/01/2014    Procedure: COLONOSCOPY WITH PROPOFOL;  Surgeon: Arta Silence, MD;  Location: [facility];  Service: Endoscopy;  Laterality: N/A;    Family History  Problem Relation Age of Onset  . Stroke Father   . Heart attack Father     Had MI in his 53s  . Stomach  cancer Paternal Grandmother   . Diabetes Maternal Grandmother   . Cerebral palsy Daughter   . Anesthesia problems Neg Hx   . Hypotension Neg Hx   . Malignant hyperthermia Neg Hx   . Pseudochol deficiency Neg Hx     Social History  Substance Use Topics  . Smoking status: Never Smoker   . Smokeless tobacco: Never Used  . Alcohol Use: No    OB History    Gravida Para Term Preterm AB TAB SAB Ectopic Multiple Living   3 1 1  2  2   1       Review of Systems  Constitutional: Negative.   HENT: Negative.   Eyes: Negative.   Respiratory: Negative.   Cardiovascular: Negative.   Gastrointestinal: Negative.   Endocrine: Negative.   Genitourinary: Positive for dysuria and frequency.  Musculoskeletal: Negative.   Skin: Negative.   Allergic/Immunologic: Negative.   Neurological: Negative.   Hematological: Negative.   Psychiatric/Behavioral: Negative.     Allergies  Review of patient's allergies indicates no known allergies.  Home Medications   Current Outpatient Rx  Name  Route  Sig  Dispense  Refill  . nitrofurantoin, macrocrystal-monohydrate, (MACROBID) 100 MG capsule   Oral   Take 1 capsule (100 mg total) by mouth 2 (two) times daily.   20 capsule   0     BP 152/82 mmHg  Pulse 84  Temp(Src) 98.2 F (36.8 C) (Oral)  Resp 18  Physical Exam  Constitutional: She is oriented to person, place, and time. She appears well-developed and well-nourished.  HENT:  Head: Normocephalic.  Eyes: Pupils are equal, round, and reactive to light.  Neck: Normal range of motion.  Cardiovascular: Normal rate, regular rhythm, normal heart sounds and intact distal pulses.   Pulmonary/Chest: Effort normal and breath sounds normal.  Abdominal: Soft. Bowel sounds are normal.  Genitourinary: Vagina normal and uterus normal.  Musculoskeletal: Normal range of motion.  Neurological: She is alert and oriented to person, place, and time. She has normal reflexes.  Skin: Skin is warm and dry.   Psychiatric: She has a normal mood and affect. Her behavior is normal. Judgment and thought content normal.    MAU Course  Procedures (including critical care time)  Labs Reviewed  URINALYSIS, ROUTINE W REFLEX MICROSCOPIC (NOT AT [facility]) - Abnormal; Notable for the following:    Glucose, UA 100 (*)    Leukocytes, UA MODERATE (*)    All other components within normal limits  URINE MICROSCOPIC-ADD ON - Abnormal; Notable for the following:    Squamous Epithelial / LPF 0-5 (*)    Bacteria, UA FEW (*)    All other components within normal limits  CBC - Abnormal; Notable for the following:    Hemoglobin 11.9 (*)    HCT 34.7 (*)    All other components within normal limits  COMPREHENSIVE METABOLIC PANEL - Abnormal; Notable for the following:    Sodium 133 (*)    Chloride 99 (*)    Glucose, Bld 286 (*)    BUN 44 (*)    Creatinine, Ser 1.57 (*)    GFR calc non Af Amer 35 (*)    GFR calc Af Amer 41 (*)    All other components within normal limits  URINE CULTURE   No results found.   1. Urinary tract infection, site not specified       MDM  Urine pos for leuks will tx pt and culture urine. Encouraged pt to seek a primary care provider.

## 2015-05-18 NOTE — MAU Note (Signed)
Up all night going to the bathroom.  Feels a warm feeling in RLQ, hurts at time.  Tylenol seems to ease it.

## 2015-05-19 ENCOUNTER — Encounter: Payer: Self-pay | Admitting: Internal Medicine

## 2015-05-19 ENCOUNTER — Ambulatory Visit: Payer: Self-pay | Admitting: Dietician

## 2015-05-20 ENCOUNTER — Telehealth: Payer: Self-pay | Admitting: Internal Medicine

## 2015-05-20 ENCOUNTER — Ambulatory Visit: Payer: Self-pay | Admitting: Obstetrics & Gynecology

## 2015-05-20 LAB — URINE CULTURE

## 2015-05-20 NOTE — Telephone Encounter (Signed)
Needs to speak to nurse °

## 2015-05-20 NOTE — Telephone Encounter (Signed)
LVM for patient

## 2015-05-20 NOTE — Telephone Encounter (Signed)
Called lm for rtc 

## 2015-05-20 NOTE — Telephone Encounter (Signed)
   Reason for call:   I received a call from [name] at 7:30  PM indicating feels warm around urethra during urination, urinary frequency.   Pertinent Data:   She went to the ED 4/10. She was prescribed a medication that she said was too expensive. She went back because she was having constant symptoms. She ws told that she did not have a UTI.   UA from 4/10 shows few bacteria, mod leukocytes, neg nitrite, 0-5 squamous epithelial, 6-30 WBC. UCx shows multiple species.  Denies fevers, chills, nausea, vomiting, back pain.   Assessment / Plan / Recommendations:   Possible UTI, unlikely to be pyelonephritis. She also may be having urinary frequency due to hyperglycemia or drinking more water.  She needs to have a new urine culture performed. Advised her to go to urgent care or come to clinic. She is going to [facility] tomorrow morning.   As always, pt is advised that if symptoms worsen or new symptoms arise, they should go to an urgent care facility or to to ER for further evaluation.   Milagros Loll, MD   05/20/2015, 7:22 PM

## 2015-05-20 NOTE — Telephone Encounter (Signed)
[facility] FOR [facility], SHE HAS QUESTIONS, PLEASE CALL

## 2015-05-20 NOTE — Telephone Encounter (Signed)
Spoke w/ pt she states she wants [facility] to prescribe abx and talk to her about her labs done at [facility] she states she does not believe them and they told her her white count was real high but didn't give her abx, she was ask to call the md she saw at [facility] and speak with the nurse there or return for further evaluation she was also offered an appt here but refused Spoke w/ dr rivet and she is aware

## 2015-05-28 ENCOUNTER — Ambulatory Visit (HOSPITAL_COMMUNITY)
Admission: EM | Admit: 2015-05-28 | Discharge: 2015-05-28 | Disposition: A | Discharge status: Home / Self Care | Payer: Self-pay | Attending: Family Medicine | Admitting: Family Medicine

## 2015-05-28 ENCOUNTER — Encounter (HOSPITAL_COMMUNITY): Payer: Self-pay | Admitting: Emergency Medicine

## 2015-05-28 ENCOUNTER — Telehealth: Payer: Self-pay

## 2015-05-28 DIAGNOSIS — N390 Urinary tract infection, site not specified: Secondary | ICD-10-CM

## 2015-05-28 LAB — POCT URINALYSIS DIP (DEVICE)
BILIRUBIN URINE: NEGATIVE
GLUCOSE, UA: NEGATIVE mg/dL
Hgb urine dipstick: NEGATIVE
KETONES UR: NEGATIVE mg/dL
Nitrite: NEGATIVE
Protein, ur: NEGATIVE mg/dL
Specific Gravity, Urine: 1.015 (ref 1.005–1.030)
Urobilinogen, UA: 0.2 mg/dL (ref 0.0–1.0)
pH: 6 (ref 5.0–8.0)

## 2015-05-28 MED ORDER — CIPROFLOXACIN HCL 500 MG PO TABS: 500.0000 mg | ORAL_TABLET | Freq: Two times a day (BID) | ORAL | Status: DC

## 2015-05-28 NOTE — ED Notes (Signed)
PT reports urinary frequency and a "warm feeling" in her stomach at night. PT is a diabetic and has not checked her sugar in a while. Symptoms come and go for 2 weeks

## 2015-05-28 NOTE — Telephone Encounter (Signed)
Attempted to call back, lm for rtc

## 2015-05-28 NOTE — Telephone Encounter (Signed)
Please call pt back.

## 2015-05-28 NOTE — ED Provider Notes (Signed)
CSN: [id number]     Arrival date & time 05/28/15  1257 History   None    Chief Complaint  Patient presents with  . Urinary Frequency   (Consider location/radiation/quality/duration/timing/severity/associated sxs/prior Treatment) Patient is a 60 y.o. female presenting with frequency. The history is provided by the patient.  Urinary Frequency This is a new problem. The problem occurs constantly. The problem has not changed since onset.Nothing aggravates the symptoms. Nothing relieves the symptoms. She has tried nothing for the symptoms.    Past Medical History  Diagnosis Date  . Diabetes mellitus type II, uncontrolled ([place])   . Hypertension   . Hyperlipidemia   . Ovarian cyst, left   . Anemia     due to menorrhagia, BL 8-10  . Vaginal cyst     nabothian and bartholin  . Anxiety   . Depression   . Postmenopausal bleeding 06/12/2008  . 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 UTI (lower urinary tract infection)   . IBS (irritable bowel syndrome)   . Chronic back pain   . Chronic abdominal pain   . DDD (degenerative disc disease), lumbar   . Diabetes mellitus without complication ([place])   . History of palpitations     evaluated recently 12'15  . Colitis   . CKD (chronic kidney disease) stage 3, GFR 30-59 ml/min     baseline creatinine 1.4-1.7  . Bilateral renal cysts 01/15/2009    Qualifier: Diagnosis of  By: Tyrell Antonio MD, Jerald Kief     Past Surgical History  Procedure Laterality Date  . Cardiac surgery      to repair congenital defect as a child- 34months old  . Esophagogastroduodenoscopy (egd) with propofol N/A 10/01/2014    Procedure: ESOPHAGOGASTRODUODENOSCOPY (EGD) WITH PROPOFOL;  Surgeon: Arta Silence, MD;  Location: [facility];  Service: Endoscopy;  Laterality: N/A;  . Colonoscopy with propofol N/A 10/01/2014    Procedure: COLONOSCOPY WITH PROPOFOL;  Surgeon: Arta Silence, MD;  Location: [facility];  Service: Endoscopy;  Laterality: N/A;    Family History  Problem Relation Age of Onset  . Stroke Father   . Heart attack Father     Had MI in his 40s  . Stomach cancer Paternal Grandmother   . Diabetes Maternal Grandmother   . Cerebral palsy Daughter   . Anesthesia problems Neg Hx   . Hypotension Neg Hx   . Malignant hyperthermia Neg Hx   . Pseudochol deficiency Neg Hx    Social History  Substance Use Topics  . Smoking status: Never Smoker   . Smokeless tobacco: Never Used  . Alcohol Use: No   OB History    Gravida Para Term Preterm AB TAB SAB Ectopic Multiple Living   3 1 1  2  2   1      Review of Systems  Constitutional: Negative.   HENT: Negative.   Eyes: Negative.   Respiratory: Negative.   Cardiovascular: Negative.   Endocrine: Negative.   Genitourinary: Positive for frequency.  Musculoskeletal: Negative.   Skin: Negative.   Allergic/Immunologic: Negative.   Neurological: Negative.   Hematological: Negative.   Psychiatric/Behavioral: Negative.     Allergies  Review of patient's allergies indicates no known allergies.  Home Medications   Prior to Admission medications   Medication Sig Start Date End Date Taking? Authorizing Provider  acetaminophen (TYLENOL) 650 MG CR tablet Take 1,300 mg by mouth 2 (two) times daily.     Historical Provider, MD  enalapril (VASOTEC)  10 MG tablet Take 1 tablet (10 mg total) by mouth daily. 06/08/14   Carly Montey Hora, MD  gabapentin (NEURONTIN) 300 MG capsule Take 300 mg by mouth 2 (two) times daily.    Historical Provider, MD  Insulin Glargine (LANTUS SOLOSTAR) 100 UNIT/ML Solostar Pen Inject 20 Units into the skin daily at 10 pm. 03/24/15   Juliet Rude, MD  nitrofurantoin, macrocrystal-monohydrate, (MACROBID) 100 MG capsule Take 1 capsule (100 mg total) by mouth 2 (two) times daily. 05/18/15   Keitha Butte, CNM  pioglitazone (ACTOS) 15 MG tablet Take 1 tablet (15 mg total) by mouth daily. 05/13/15   Jones Bales, MD  simvastatin (ZOCOR) 40 MG tablet TAKE 1 TABLET  (40 MG TOTAL) BY MOUTH AT BEDTIME. Patient not taking: Reported on 05/18/2015 11/26/13   Ejiroghene Arlyce Dice, MD   Meds Ordered and Administered this Visit  Medications - No data to display  BP 144/83 mmHg  Pulse 83  Temp(Src) 98 F (36.7 C) (Oral)  Resp 16  Ht 5\' 4"  (1.626 m)  Wt 172 lb (78.019 kg)  BMI 29.51 kg/m2  SpO2 98% No data found.   Physical Exam  Constitutional: She appears well-developed and well-nourished.  HENT:  Head: Normocephalic and atraumatic.  Eyes: Conjunctivae are normal. Pupils are equal, round, and reactive to light.  Cardiovascular: Normal rate and regular rhythm.   Pulmonary/Chest: Effort normal and breath sounds normal.  Abdominal: Soft. Bowel sounds are normal.    ED Course  Procedures (including critical care time)  Labs Review Labs Reviewed - No data to display  Imaging Review No results found.   Visual Acuity Review  Right Eye Distance:   Left Eye Distance:   Bilateral Distance:    Right Eye Near:   Left Eye Near:    Bilateral Near:         MDM  UTI - Cipro 500mg  one po bid x 7 days #14 Push po fluids, rest, follow up with PCP for diabetes.      Lysbeth Penner, FNP 05/28/15 1343

## 2015-05-28 NOTE — Discharge Instructions (Signed)
Antibiotic Medicine °Antibiotic medicines are used to treat infections caused by bacteria. They work by injuring or killing the bacteria that is making you sick. °HOW IS AN ANTIBIOTIC CHOSEN? °An antibiotic is chosen based on many factors. To help your health care provider choose one for you, tell your health care provider if: °· You have any allergies. °· You are pregnant or plan to get pregnant. °· You are breastfeeding. °· You are taking any medicines. These include over-the-counter medicines, prescription medicines, and herbal remedies. °· You have a medical condition or problem you have not already discussed. °Your health care provider will also consider: °· How often the medicine has to be taken. °· Common side effects of the medicine. °· The cost of the medicine. °· The taste of the medicine. °If you have questions about why an antibiotic was chosen, make sure to ask. °FOR HOW LONG SHOULD I TAKE MY ANTIBIOTIC? °Continue to take your antibiotic for as long as told by your health care provider. Do not stop taking it when you feel better. If you stop taking it too soon: °· You may start to feel sick again. °· Your infection may become harder to treat. °· Complications may develop. °WHAT IF I MISS A DOSE? °Try not to miss any doses of medicine. If you miss a dose, take it as soon as possible. However, if it is almost time for the next dose: °· If you are taking 2 doses per day, take the missed dose and the next dose 5 to 6 hours apart. °· If you are taking 3 or more doses per day, take the missed dose and the next dose 2 to 4 hours apart, then go back to the normal schedule. °If you cannot make up a missed dose, take the next scheduled dose on time. Then take the missed dose after you have taken all the doses as recommended by your health care provider, as if you had one more dose left. °DO ANTIBIOTICS AFFECT BIRTH CONTROL? °Birth control pills may not work while you are on antibiotics. If you are taking birth  control pills, continue taking them as usual and use a second form of birth control, such as a condom, to avoid unwanted pregnancy. Continue using the second form of birth control until you are finished with your current 1 month cycle of birth control pills. °OTHER INFORMATION °· If there is any medicine left over, throw it away. °· Never take someone else's antibiotics. °· Never take leftover antibiotics. °SEEK MEDICAL CARE IF: °· You get worse. °· You do not feel better within a few days of starting the antibiotic medicine. °· You vomit. °· White patches appear in your mouth. °· You have new joint pain that begins after starting the antibiotic. °· You have new muscle aches that begin after starting the antibiotic. °· You had a fever before starting the antibiotic and it returns. °· You have any symptoms of an allergic reaction, such as an itchy rash. If this happens, stop taking the antibiotic. °SEEK IMMEDIATE MEDICAL CARE IF: °· Your urine turns dark or becomes blood-colored. °· Your skin turns yellow. °· You bruise or bleed easily. °· You have severe diarrhea and abdominal cramps. °· You have a severe headache. °· You have signs of a severe allergic reaction, such as: °¨ Trouble breathing. °¨ Wheezing. °¨ Swelling of the lips, tongue, or face. °¨ Fainting. °¨ Blisters on the skin or in the mouth. °If you have signs of a severe allergic   reaction, stop taking the antibiotic right away. °  °This information is not intended to replace advice given to you by your health care provider. Make sure you discuss any questions you have with your health care provider. °  °Document Released: 10/07/2003 Document Revised: 10/15/2014 Document Reviewed: 06/11/2014 °Elsevier Interactive Patient Education ©2016 Elsevier Inc. ° °

## 2015-05-29 ENCOUNTER — Inpatient Hospital Stay (HOSPITAL_COMMUNITY)
Admission: AD | Admit: 2015-05-29 | Discharge: 2015-05-29 | Disposition: A | Discharge status: Home / Self Care | Payer: Medicaid Other | Source: Ambulatory Visit | Attending: Obstetrics & Gynecology | Admitting: Obstetrics & Gynecology

## 2015-05-29 ENCOUNTER — Encounter (HOSPITAL_COMMUNITY): Payer: Self-pay | Admitting: *Deleted

## 2015-05-29 DIAGNOSIS — E785 Hyperlipidemia, unspecified: Secondary | ICD-10-CM | POA: Diagnosis not present

## 2015-05-29 DIAGNOSIS — F329 Major depressive disorder, single episode, unspecified: Secondary | ICD-10-CM | POA: Diagnosis not present

## 2015-05-29 DIAGNOSIS — F419 Anxiety disorder, unspecified: Secondary | ICD-10-CM | POA: Diagnosis not present

## 2015-05-29 DIAGNOSIS — I129 Hypertensive chronic kidney disease with stage 1 through stage 4 chronic kidney disease, or unspecified chronic kidney disease: Secondary | ICD-10-CM | POA: Insufficient documentation

## 2015-05-29 DIAGNOSIS — Z794 Long term (current) use of insulin: Secondary | ICD-10-CM | POA: Insufficient documentation

## 2015-05-29 DIAGNOSIS — E119 Type 2 diabetes mellitus without complications: Secondary | ICD-10-CM | POA: Diagnosis not present

## 2015-05-29 DIAGNOSIS — N39498 Other specified urinary incontinence: Secondary | ICD-10-CM | POA: Diagnosis not present

## 2015-05-29 DIAGNOSIS — N183 Chronic kidney disease, stage 3 (moderate): Secondary | ICD-10-CM | POA: Diagnosis not present

## 2015-05-29 DIAGNOSIS — R3915 Urgency of urination: Secondary | ICD-10-CM | POA: Insufficient documentation

## 2015-05-29 LAB — URINALYSIS, ROUTINE W REFLEX MICROSCOPIC
Bilirubin Urine: NEGATIVE
Glucose, UA: 250 mg/dL — AB
Hgb urine dipstick: NEGATIVE
Ketones, ur: NEGATIVE mg/dL
NITRITE: NEGATIVE
Protein, ur: NEGATIVE mg/dL
pH: 5.5 (ref 5.0–8.0)

## 2015-05-29 LAB — URINE MICROSCOPIC-ADD ON: RBC / HPF: NONE SEEN RBC/hpf (ref 0–5)

## 2015-05-29 NOTE — MAU Note (Signed)
Pt reports her bladder was "leaking today"and it feels warm.

## 2015-05-29 NOTE — Discharge Instructions (Signed)
Urinary Incontinence Urinary incontinence is the involuntary loss of urine from your bladder. CAUSES  There are many causes of urinary incontinence. They include:  Medicines.  Infections.  Prostatic enlargement, leading to overflow of urine from your bladder.  Surgery.  Neurological diseases.  Emotional factors. SIGNS AND SYMPTOMS Urinary Incontinence can be divided into four types: 1. Urge incontinence. Urge incontinence is the involuntary loss of urine before you have the opportunity to go to the bathroom. There is a sudden urge to void but not enough time to reach a bathroom. 2. Stress incontinence. Stress incontinence is the sudden loss of urine with any activity that forces urine to pass. It is commonly caused by anatomical changes to the pelvis and sphincter areas of your body. 3. Overflow incontinence. Overflow incontinence is the loss of urine from an obstructed opening to your bladder. This results in a backup of urine and a resultant buildup of pressure within the bladder. When the pressure within the bladder exceeds the closing pressure of the sphincter, the urine overflows, which causes incontinence, similar to water overflowing a dam. 4. Total incontinence. Total incontinence is the loss of urine as a result of the inability to store urine within your bladder. DIAGNOSIS  Evaluating the cause of incontinence may require:  A thorough and complete medical and obstetric history.  A complete physical exam.  Laboratory tests such as a urine culture and sensitivities. When additional tests are indicated, they can include:  An ultrasound exam.  Kidney and bladder X-rays.  Cystoscopy. This is an exam of the bladder using a narrow scope.  Urodynamic testing to test the nerve function to the bladder and sphincter areas. TREATMENT  Treatment for urinary incontinence depends on the cause:  For urge incontinence caused by a bacterial infection, antibiotics will be  prescribed. If the urge incontinence is related to medicines you take, your health care provider may have you change the medicine.  For stress incontinence, surgery to re-establish anatomical support to the bladder or sphincter, or both, will often correct the condition.  For overflow incontinence caused by an enlarged prostate, an operation to open the channel through the enlarged prostate will allow the flow of urine out of the bladder. In women with fibroids, a hysterectomy may be recommended.  For total incontinence, surgery on your urinary sphincter may help. An artificial urinary sphincter (an inflatable cuff placed around the urethra) may be required. In women who have developed a hole-like passage between their bladder and vagina (vesicovaginal fistula), surgery to close the fistula often is required. HOME CARE INSTRUCTIONS  Normal daily hygiene and the use of pads or adult diapers that are changed regularly will help prevent odors and skin damage.  Avoid caffeine. It can overstimulate your bladder.  Use the bathroom regularly. Try about every 2-3 hours to go to the bathroom, even if you do not feel the need to do so. Take time to empty your bladder completely. After urinating, wait a minute. Then try to urinate again.  For causes involving nerve dysfunction, keep a log of the medicines you take and a journal of the times you go to the bathroom. SEEK MEDICAL CARE IF:  You experience worsening of pain instead of improvement in pain after your procedure.  Your incontinence becomes worse instead of better. SEE IMMEDIATE MEDICAL CARE IF:  You experience fever or shaking chills.  You are unable to pass your urine.  You have redness spreading into your groin or down into your thighs. MAKE SURE  YOU:   Understand these instructions.   Will watch your condition.  Will get help right away if you are not doing well or get worse.   This information is not intended to replace advice  given to you by your health care provider. Make sure you discuss any questions you have with your health care provider.   Document Released: 03/03/2004 Document Revised: 02/14/2014 Document Reviewed: 07/03/2012 Elsevier Interactive Patient Education 2016 [employer].   [facility] Guide (Revised August 2014)   Chronic Pain Problems:   [facility]:  [phone]           Patients need to be referred by their primary care doctor/specialist  Insufficient Money for Medicine:           United Way: call "211"    MAP Program at [employer] or [place] [phone]            No Primary Care Doctor:  To locate a primary care doctor that accepts your insurance or provides certain services:           [place]: [phone]           Physician Referral Service: [phone] ask for My [place]  If no insurance, you need to see if you qualify for [facility] orange card, call to set      up appointment for eligibility/enrollment at [phone] or [phone] or visit [place] ([address], [place] and [place]) to meet with a [facility] enrollment specialist.  Agencies that provide inexpensive (sliding fee scale) medical care:       [facility] - Family Medicine at [place] - [phone]     [facility] and [place] - [facility] - [place] [phone]     [facility] for Children - [place] [phone]  [facility] - [place] @ [place] [phone]  [facility] - [place] @ [place] - [phone]  [facility]: [phone]   Women's Clinic: [phone]   Planned Parenthood:  [phone]   [facility] [place]    [place] Providers:           [facility] [phone] (No Family Planning accepted)          [address], [phone], Mon-Fri 9am-5pm          [facility] - [phone]  [place], [address] [place], Mon-Thursday 8am-5pm, Fri 8am-noon  [place] - [place], [address], Mon-Fri 7:30am-4:30pm          [place] - [phone]          [address], [facility] - [phone] [address], [address]          Only accepts [place] [employer] patients after they have their name applied to  their card  Self Pay (no insurance) in [facility]:           Sickle Cell Patients:   [place], [phone] [facility] Internal [facility]:  [place] [phone]       [facility]  [place] [phone]  [facility]:  [address], [phone]          [facility]           [place], [phone] [facility] for [place], [phone]           [facility] Care [place]           [place], [address], [place]        [facility] - [address]           [phone], Mon-Fri 9am-7pm, [place] 9am-1pm          [facility] - Family Medicine @ [facility]          [place], [place]          [facility] - [facility]           [address] [place] [facility] Adult and [place]  [address] [place] [phone]          [place],  [place]  [facility] - [place] [employer]   [place], [place] [phone] [facility] - [facility]  [address] [phone]  Dr. Vista Lawman           [address], [place], [facility]           [address], [phone]          [facility]             [address] [phone]          [facility]           [place], [place], 1st & 3rd Saturday every month, 10am-1pm  OTHERS:  [facility]  ([facility] Only)  [phone] (Thursday only)  Strategies for finding a Primary Care Provider:  1) Find a Doctor and Pay Out of Pocket  Although you won't have to find out who is covered by your insurance plan, it is a good idea to ask around and get recommendations. You will then need to call the office and see if the doctor you have chosen will accept you as a new patient and what types of options they offer for patients who are self-pay. Some doctors offer discounts or will set up payment plans for their patients who do not have insurance, but you will need to ask so you aren't surprised when you get to your appointment.  2) [place] - To see if you qualify for orange card access to healthcare safety net providers.  Call for appointment for eligibility/enrollment at [phone] or [phone]. (Uninsured,  0-200% FPL, qualifying info)  Applicants for [facility] are first required to see if they are eligible to enroll in the [facility] Marketplace before enrolling in [facility] (and get an exemption if they are not).  GCCN Criteria for acceptance is:  ? Proof of ACA Marketing exemption - form or documentation  ? Valid photo ID (driver's license, state identification card, passport, home country ID)  ? Proof of [facility] residency (e.g. drivers license, lease/landlord information,  pay stubs with address, utility bill, bank statement, etc.)  ? Proof of income (1040, last year's tax return, W2, 4 current pay stubs, other income proof)  ? Proof of assets (current bank statement + 3 most recent, disability paperwork, life insurance info, tax value on autos, etc.)  3) Person Department  Not all health departments have doctors that can see patients for sick visits, but many do, so it is worth a call to see if yours does. If you don't know where your local health department is, you can check in your phone book. The CDC also has a tool to help you locate your state's health department, and many state websites also have listings of all of their local health departments.  4) Find a [facility]  If your illness is not likely to be very severe or complicated, you may want to try a walk in clinic. These are popping up all over the country in pharmacies, drugstores, and shopping centers. They're usually staffed by nurse practitioners or physician assistants that have been trained to treat common illnesses and complaints. They're usually fairly quick and inexpensive. However, if you have serious medical issues or chronic medical problems, these are probably not your best option   STD Testing:           [place], [place] [facility]           [address], [place], phone [phone] or [phone]           Monday - Friday, call for an appointment          [place], [place] [facility]           [address], [place], phone [phone] or [phone]           Monday - Friday, call for an appointment Abuse/Neglect:           [place]: [place]: [phone] (After Hours)  Emergency Shelter:  [facility] [phone]  [place]- [phone]  [facility] [place] - [phone]  [place] - [phone] (ages 29-17)  [place] @ [employer] - [phone]   [place] at [place]:           Room at the [place]: [phone]   (Homeless mother with children)          [place]: [phone] (Mothers only)  [facility]: [phone] (Pregnant 62-71 years old)  Adopt a Mom -([phone]  [facility]   [facility] and [place], [place] [phone]          [facility] [place]  [address], [place], [place]          [facility].           [place], [place]          [facility]           [phone]          [facility] in [place]           [phone], [place]           [phone]           [phone] (After Hours)  [place] Abuse Resources:           Alcohol and Drug Services: [phone]           [facility]: [phone]          [facility]: [phone]   Narcotics Helpline - [phone]          [facility]: [phone]           Residential & Outpatient Substance Abuse Program - [facility] [place]: [phone]  [employer]  [place] and [place] - [phone] Psychological Services:          [place]: [phone]   Therapeutic Alternatives: [phone]          [place]           [address]:  [phone]     (24 Hour)  Mobile Crisis:   [place]:  Radio producer on [place] [phone] [facility] on [place] [phone]  Walk In [place]  ([place] - [phone] or [phone]  [place]. [place] [phone]  [place] [address], [place] [phone]   Dental Assistance:  If unable to pay or uninsured, contact: [facility]. to become qualified for the adult dental clinic. Patient must be enrolled in [facility] (uninsured, 0-200% FPL, qualifying info).  Enroll in [facility] first, then see Primary Care Physician assigned to you, the PCP makes a dental referral. [place] [facility] will receive referral and contacts patient for appointment.  Patients with Medicaid           [address] [place] (Children up to 88 + Pregnant Women) - [phone]  [place] - Suite [phone]  If unable to pay, or uninsured:  contact [place] [phone] in [place] - ([place] only + Pregnant Women), [phone] in [place] only) to become qualified for the adult dental clinic  Must see if eligible to enroll in [place] before enrolling into the [facility] (exemption required) [phone] for an appointment)  [email or web address];   [phone].  If not eligible for ACA, then go by Department of Health and Human Services to see if eligible for orange card.  [address] [place].  Once you get an orange card, you will have a Primary Care home who will then refer you to dental if needed.        Other Scientist, forensic:   [facility] [phone] (ext  [phone])   [address]  Dr. Donn Pierini - [phone]   [facility] [place]   [facility]           [place], [place], [place], [address]           [phone], Ext. [phone]           2nd and 4th Thursday of the month at 6:30am (Simple extractions only - no wisdom teeth or surgery) First come/First serve -First 10 clients served           [facility] [place], [place] and [place] residents only)          [address], [place], [place], [address]           [phone]                    [place] Department           [phone]          [place] Department          [phone]         [facility]          [phone]   Transportation Options:  Ambulance - 911 - [phone] per ride Family Member to accompany patient (if stable) - [place] - [phone]  PART - [phone]  Taxi - [phone] - [place] [phone] (Application required)  [facility] - [phone]

## 2015-05-29 NOTE — MAU Provider Note (Signed)
Chief Complaint: No chief complaint on file.   First Provider Initiated Contact with Patient 05/29/15 1646      SUBJECTIVE HPI: [name] is a 60 y.o. [id number] who presents to maternity admissions reporting increased urinary incontinence and intermittent warm sensation in her abdomen.  Patient was worked up for UTI in MAU on 4/10, and was seen at urgent care yesterday (4/20) and rx Cipro due to continued complaints of urinary symptoms and presence of leukocytes on U/A.  Patient has a history of non-specified urinary incontinence, but reports that this morning she had more leakage of urine compared to her baseline.  Patient denies dysuria. She reports frequency and urgency, but that this is not changed from what she has on a regular basis.  She denies vaginal bleeding, vaginal itching/burning, h/a, dizziness, n/v, or fever/chills.  Patient also complains of having an intermittent sensation of warmth in her abdomen, where she believes her bladder is.  She does not describe it as being painful.  Patient reports that she knows that this is not the appropriate setting for her urinary incontinence and bladder problems to be evaluated, but that she does not currently have a regular PCP where her chronic medical conditions are managed.  Patient reports that she believes that her urinary issues may be related to inadequate control of her sugars related to her diabetes, and to not having follow-up with a urologist or nephrologist.   HPI  Past Medical History  Diagnosis Date  . Diabetes mellitus type II, uncontrolled ([place])   . Hypertension   . Hyperlipidemia   . Ovarian cyst, left   . Anemia     due to menorrhagia, BL 8-10  . Vaginal cyst     nabothian and bartholin  . Anxiety   . Depression   . Postmenopausal bleeding 06/12/2008  . 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 UTI (lower urinary tract infection)   . IBS (irritable bowel syndrome)   . Chronic back pain   . Chronic  abdominal pain   . DDD (degenerative disc disease), lumbar   . Diabetes mellitus without complication ([place])   . History of palpitations     evaluated recently 12'15  . Colitis   . CKD (chronic kidney disease) stage 3, GFR 30-59 ml/min     baseline creatinine 1.4-1.7  . Bilateral renal cysts 01/15/2009    Qualifier: Diagnosis of  By: Tyrell Antonio MD, Jerald Kief     Past Surgical History  Procedure Laterality Date  . Cardiac surgery      to repair congenital defect as a child- 45months old  . Esophagogastroduodenoscopy (egd) with propofol N/A 10/01/2014    Procedure: ESOPHAGOGASTRODUODENOSCOPY (EGD) WITH PROPOFOL;  Surgeon: Arta Silence, MD;  Location: [facility];  Service: Endoscopy;  Laterality: N/A;  . Colonoscopy with propofol N/A 10/01/2014    Procedure: COLONOSCOPY WITH PROPOFOL;  Surgeon: Arta Silence, MD;  Location[facility];  Service: Endoscopy;  Laterality: N/A;   Social History   Social History  . Marital Status: Divorced    Spouse Name: N/A  . Number of Children: 1  . Years of Education: 12th grade   Occupational History  . 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 Smoking status: Never Smoker   . Smokeless tobacco: Never Used  . Alcohol Use: No  . Drug Use: No  . Sexual Activity: No   Other Topics Concern  . 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   No current facility-administered medications on file prior to encounter.   Current Outpatient Prescriptions on File Prior to Encounter  Medication Sig Dispense Refill  . acetaminophen (TYLENOL) 650 MG CR tablet Take 1,300 mg by mouth 2 (two) times daily.     . ciprofloxacin (CIPRO) 500 MG tablet Take 1 tablet (500 mg total) by mouth 2 (two) times daily. 14 tablet 0  . enalapril (VASOTEC) 10 MG tablet Take 1 tablet (10 mg total) by mouth daily. 30 tablet 6  . gabapentin (NEURONTIN) 300 MG capsule Take 300 mg by mouth 2 (two) times daily.     . Insulin Glargine (LANTUS SOLOSTAR) 100 UNIT/ML Solostar Pen Inject 20 Units into the skin daily at 10 pm. 15 mL 11  . pioglitazone (ACTOS) 15 MG tablet Take 1 tablet (15 mg total) by mouth daily. 30 tablet 2  . [DISCONTINUED] pantoprazole (PROTONIX) 20 MG tablet Take 2 tablets (40 mg total) by mouth daily. 30 tablet 1  . [DISCONTINUED] sertraline (ZOLOFT) 100 MG tablet Take 1 tablet (100 mg total) by mouth daily. 30 tablet 2   No Known Allergies  ROS:  Review of Systems  Constitutional: Negative for fever and chills.  Gastrointestinal: Negative for nausea, vomiting and abdominal pain.  Genitourinary: Positive for urgency and frequency. Negative for dysuria, flank pain, difficulty urinating and pelvic pain.       Incontinence; intermittent leakage of urine, increased from pt baseline     I have reviewed patient's Past Medical Hx, Surgical Hx, Family Hx, Social Hx, medications and allergies.   Physical Exam  Patient Vitals for the past 24 hrs:  BP Temp Temp src Pulse Resp  05/29/15 1629 140/72 mmHg 97.8 F (36.6 C) Oral 88 18   Constitutional: Well-developed, well-nourished female in no acute distress.  Cardiovascular: normal rate Respiratory: normal effort GI: Abd soft, non-tender. Pos BS x 4 MS: Extremities nontender, no edema, normal ROM Neurologic: Alert and oriented x 4.  GU: Neg CVAT.  PELVIC EXAM: Deferred  LAB RESULTS Results for orders placed or performed during the hospital encounter of 05/29/15 (from the past 24 hour(s))  Urinalysis, Routine w reflex microscopic (not at [facility])     Status: Abnormal   Collection Time: 05/29/15  4:19 PM  Result Value Ref Range   Color, Urine STRAW (A) YELLOW   APPearance CLEAR CLEAR   Specific Gravity, Urine <1.005 (L) 1.005 - 1.030   pH 5.5 5.0 - 8.0   Glucose, UA 250 (A) NEGATIVE mg/dL   Hgb urine dipstick NEGATIVE NEGATIVE   Bilirubin Urine NEGATIVE NEGATIVE   Ketones, ur NEGATIVE NEGATIVE mg/dL   Protein, ur NEGATIVE  NEGATIVE mg/dL   Nitrite NEGATIVE NEGATIVE   Leukocytes, UA SMALL (A) NEGATIVE  Urine microscopic-add on     Status: Abnormal   Collection Time: 05/29/15  4:19 PM  Result Value Ref Range   Squamous Epithelial / LPF 0-5 (A) NONE SEEN   WBC, UA 0-5 0 - 5 WBC/hpf   RBC / HPF NONE SEEN 0 - 5 RBC/hpf   Bacteria, UA RARE (A) NONE SEEN       IMAGING No results found.  MAU Management/MDM: Ordered UA and reviewed results.  Discussed with pt needs for outpatient evaluation by primary care and possibly urology for urinary symptoms. Offered pt pyridium for dysuria but pt denies any pain and does not want medication. Pt reports she is unhappy with her  primary care provider and has tried lots of providers and has been unhappy.  She has paperwork at home to fill out for discount within [facility] system but has not completed them.  Pt given resources regarding choosing a primary care provider and resources in [facility] for low-income/no insurance.  Pt unsure if she will follow up.  Encouraged pt to seek care.  Pt stable at time of discharge.  ASSESSMENT 1. Other urinary incontinence     PLAN Discharge home F/U with primary care and urology    Medication List    STOP taking these medications        ciprofloxacin 500 MG tablet  Commonly known as:  CIPRO      TAKE these medications        acetaminophen 650 MG CR tablet  Commonly known as:  TYLENOL  Take 1,300 mg by mouth 2 (two) times daily.     enalapril 10 MG tablet  Commonly known as:  VASOTEC  Take 1 tablet (10 mg total) by mouth daily.     gabapentin 300 MG capsule  Commonly known as:  NEURONTIN  Take 300 mg by mouth 2 (two) times daily.     Insulin Glargine 100 UNIT/ML Solostar Pen  Commonly known as:  LANTUS SOLOSTAR  Inject 20 Units into the skin daily at 10 pm.     pioglitazone 15 MG tablet  Commonly known as:  ACTOS  Take 1 tablet (15 mg total) by mouth daily.           Follow-up Information    Follow up with  Albin Felling, MD.   Specialty:  Internal Medicine   Why:  Follow up with primary care provider as soon as possible, return to emergency room as needed for emergencies   Contact information:   [place] [address] [phone]       Marisel Tostenson [place] Certified Nurse-Midwife 05/29/2015  5:37 PM

## 2015-05-30 ENCOUNTER — Encounter (HOSPITAL_COMMUNITY): Payer: Self-pay | Admitting: *Deleted

## 2015-05-30 ENCOUNTER — Emergency Department (HOSPITAL_COMMUNITY)
Admission: EM | Admit: 2015-05-30 | Discharge: 2015-05-30 | Disposition: A | Discharge status: Home / Self Care | Payer: Medicaid Other | Attending: Emergency Medicine | Admitting: Emergency Medicine

## 2015-05-30 ENCOUNTER — Emergency Department (HOSPITAL_COMMUNITY): Payer: Medicaid Other

## 2015-05-30 DIAGNOSIS — F329 Major depressive disorder, single episode, unspecified: Secondary | ICD-10-CM | POA: Diagnosis not present

## 2015-05-30 DIAGNOSIS — Z794 Long term (current) use of insulin: Secondary | ICD-10-CM | POA: Insufficient documentation

## 2015-05-30 DIAGNOSIS — R079 Chest pain, unspecified: Secondary | ICD-10-CM | POA: Diagnosis present

## 2015-05-30 DIAGNOSIS — Z8719 Personal history of other diseases of the digestive system: Secondary | ICD-10-CM | POA: Diagnosis not present

## 2015-05-30 DIAGNOSIS — R0789 Other chest pain: Secondary | ICD-10-CM | POA: Insufficient documentation

## 2015-05-30 DIAGNOSIS — Z87440 Personal history of urinary (tract) infections: Secondary | ICD-10-CM | POA: Diagnosis not present

## 2015-05-30 DIAGNOSIS — Z79899 Other long term (current) drug therapy: Secondary | ICD-10-CM | POA: Diagnosis not present

## 2015-05-30 DIAGNOSIS — E119 Type 2 diabetes mellitus without complications: Secondary | ICD-10-CM | POA: Insufficient documentation

## 2015-05-30 DIAGNOSIS — Z862 Personal history of diseases of the blood and blood-forming organs and certain disorders involving the immune mechanism: Secondary | ICD-10-CM | POA: Insufficient documentation

## 2015-05-30 DIAGNOSIS — F419 Anxiety disorder, unspecified: Secondary | ICD-10-CM | POA: Diagnosis not present

## 2015-05-30 DIAGNOSIS — N183 Chronic kidney disease, stage 3 (moderate): Secondary | ICD-10-CM | POA: Insufficient documentation

## 2015-05-30 DIAGNOSIS — Q6102 Congenital multiple renal cysts: Secondary | ICD-10-CM | POA: Diagnosis not present

## 2015-05-30 DIAGNOSIS — G8929 Other chronic pain: Secondary | ICD-10-CM | POA: Insufficient documentation

## 2015-05-30 DIAGNOSIS — I129 Hypertensive chronic kidney disease with stage 1 through stage 4 chronic kidney disease, or unspecified chronic kidney disease: Secondary | ICD-10-CM | POA: Diagnosis not present

## 2015-05-30 LAB — BASIC METABOLIC PANEL
ANION GAP: 12 (ref 5–15)
BUN: 41 mg/dL — ABNORMAL HIGH (ref 6–20)
CALCIUM: 9.3 mg/dL (ref 8.9–10.3)
CHLORIDE: 97 mmol/L — AB (ref 101–111)
CO2: 25 mmol/L (ref 22–32)
CREATININE: 1.73 mg/dL — AB (ref 0.44–1.00)
GFR calc Af Amer: 36 mL/min — ABNORMAL LOW (ref >60)
GFR calc non Af Amer: 31 mL/min — ABNORMAL LOW (ref >60)
Glucose, Bld: 222 mg/dL — ABNORMAL HIGH (ref 65–99)
Potassium: 4.7 mmol/L (ref 3.5–5.1)
Sodium: 134 mmol/L — ABNORMAL LOW (ref 135–145)

## 2015-05-30 LAB — I-STAT TROPONIN, ED
TROPONIN I, POC: 0 ng/mL (ref 0.00–0.08)
Troponin i, poc: 0 ng/mL (ref 0.00–0.08)

## 2015-05-30 LAB — CBC
HCT: 37 % (ref 36.0–46.0)
HEMOGLOBIN: 12.3 g/dL (ref 12.0–15.0)
MCH: 29.9 pg (ref 26.0–34.0)
MCHC: 33.2 g/dL (ref 30.0–36.0)
MCV: 89.8 fL (ref 78.0–100.0)
Platelets: 205 10*3/uL (ref 150–400)
RBC: 4.12 MIL/uL (ref 3.87–5.11)
RDW: 12.5 % (ref 11.5–15.5)
WBC: 9 10*3/uL (ref 4.0–10.5)

## 2015-05-30 NOTE — ED Provider Notes (Signed)
CSN: [id number]     Arrival date & time 05/30/15  1155 History   First MD Initiated Contact with Patient 05/30/15 1238     Chief Complaint  Patient presents with  . Chest Pain     (Consider location/radiation/quality/duration/timing/severity/associated sxs/prior Treatment) The history is provided by the patient.     Pt with hx HTN, HLD, DM, CKD, depression, chronic pain p/w two episodes of right sided chest pain that occurred once last night and once this morning around 10am.  The symptoms are described as "catchy feeling" and lasted seconds to a minute and resolved.  No associated factors.  The symptoms occurred at rest.  Pt has had multiple visits recently for urinary frequency and warm feeling in her urethra with urinating - this occurs intermittently, she is not having those symptoms currently.  Does not check her blood sugars and is unaware of her blood glucose level on the days that these symptoms occur.  Denies fevers, cough, SOB, right arm pain/numbness/weakness, neck pain, cough, hemptysis, diaphoresis, lightheadedness/dizziness, N/V, abdominal pain.  Denies trauma to the chest or recent falls.    Past Medical History  Diagnosis Date  . Diabetes mellitus type II, uncontrolled ([place])   . Hypertension   . Hyperlipidemia   . Ovarian cyst, left   . Anemia     due to menorrhagia, BL 8-10  . Vaginal cyst     nabothian and bartholin  . Anxiety   . Depression   . Postmenopausal bleeding 06/12/2008  . 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 UTI (lower urinary tract infection)   . IBS (irritable bowel syndrome)   . Chronic back pain   . Chronic abdominal pain   . DDD (degenerative disc disease), lumbar   . Diabetes mellitus without complication ([place])   . History of palpitations     evaluated recently 12'15  . Colitis   . CKD (chronic kidney disease) stage 3, GFR 30-59 ml/min     baseline creatinine 1.4-1.7  . Bilateral renal cysts 01/15/2009    Qualifier:  Diagnosis of  By: Tyrell Antonio MD, Jerald Kief     Past Surgical History  Procedure Laterality Date  . Cardiac surgery      to repair congenital defect as a child- 56months old  . Esophagogastroduodenoscopy (egd) with propofol N/A 10/01/2014    Procedure: ESOPHAGOGASTRODUODENOSCOPY (EGD) WITH PROPOFOL;  Surgeon: Arta Silence, MD;  Location: [facility];  Service: Endoscopy;  Laterality: N/A;  . Colonoscopy with propofol N/A 10/01/2014    Procedure: COLONOSCOPY WITH PROPOFOL;  Surgeon: Arta Silence, MD;  Location: [facility];  Service: Endoscopy;  Laterality: N/A;   Family History  Problem Relation Age of Onset  . Stroke Father   . Heart attack Father     Had MI in his 31s  . Stomach cancer Paternal Grandmother   . Diabetes Maternal Grandmother   . Cerebral palsy Daughter   . Anesthesia problems Neg Hx   . Hypotension Neg Hx   . Malignant hyperthermia Neg Hx   . Pseudochol deficiency Neg Hx    Social History  Substance Use Topics  . Smoking status: Never Smoker   . Smokeless tobacco: Never Used  . Alcohol Use: No   OB History    Gravida Para Term Preterm AB TAB SAB Ectopic Multiple Living   3 1 1  2  2   1      Review of Systems  Genitourinary: Positive for frequency.  All other  systems reviewed and are negative.     Allergies  Review of patient's allergies indicates no known allergies.  Home Medications   Prior to Admission medications   Medication Sig Start Date End Date Taking? Authorizing Provider  acetaminophen (TYLENOL) 650 MG CR tablet Take 1,300 mg by mouth 2 (two) times daily.    Yes Historical Provider, MD  enalapril (VASOTEC) 10 MG tablet Take 1 tablet (10 mg total) by mouth daily. 06/08/14  Yes Carly Montey Hora, MD  gabapentin (NEURONTIN) 300 MG capsule Take 300 mg by mouth 2 (two) times daily.   Yes Historical Provider, MD  Insulin Glargine (LANTUS SOLOSTAR) 100 UNIT/ML Solostar Pen Inject 20 Units into the skin daily at 10 pm. 03/24/15  Yes Carly J Rivet, MD   pioglitazone (ACTOS) 15 MG tablet Take 1 tablet (15 mg total) by mouth daily. 05/13/15  Yes Jones Bales, MD   BP 127/63 mmHg  Pulse 84  Temp(Src) 98.6 F (37 C) (Oral)  Resp 17  SpO2 97% Physical Exam  Constitutional: She appears well-developed and well-nourished. No distress.  HENT:  Head: Normocephalic and atraumatic.  Neck: Neck supple.  Cardiovascular: Normal rate, regular rhythm and intact distal pulses.   Pulmonary/Chest: Effort normal and breath sounds normal. No respiratory distress. She has no wheezes. She has no rales. She exhibits tenderness.  Abdominal: Soft. She exhibits no distension. There is no tenderness. There is no rebound and no guarding.  Musculoskeletal: She exhibits no edema or tenderness.  Neurological: She is alert.  Skin: She is not diaphoretic.  Nursing note and vitals reviewed.   ED Course  Procedures (including critical care time) Labs Review Labs Reviewed  BASIC METABOLIC PANEL - Abnormal; Notable for the following:    Sodium 134 (*)    Chloride 97 (*)    Glucose, Bld 222 (*)    BUN 41 (*)    Creatinine, Ser 1.73 (*)    GFR calc non Af Amer 31 (*)    GFR calc Af Amer 36 (*)    All other components within normal limits  CBC  I-STAT TROPOININ, ED  Randolm Idol, ED    Imaging Review Dg Chest 2 View  05/30/2015  CLINICAL DATA:  Right side chest pain, SOB x2 days. Hx of HTN, DM, anxiety, CKD, congenital heart defect. Nonsmoker. EXAM: CHEST  2 VIEW COMPARISON:  11/25/2014 FINDINGS: The heart is mildly enlarged. Lungs are clear. No pulmonary edema. Stable appearance of upper lumbar wedge compression fracture and associated degenerative changes. IMPRESSION: Cardiomegaly. Electronically Signed   By: Nolon Nations M.D.   On: 05/30/2015 12:39   I have personally reviewed and evaluated these images and lab results as part of my medical decision-making.   EKG Interpretation   Date/Time:  Saturday May 30 2015 12:02:29 EDT Ventricular  Rate:  92 PR Interval:  170 QRS Duration: 106 QT Interval:  348 QTC Calculation: 430 R Axis:   49 Text Interpretation:  Normal sinus rhythm Incomplete right bundle branch  block Borderline ECG Confirmed by Alvino Chapel  MD, Ovid Curd [phone]) on  05/30/2015 12:08:10 PM      MDM   Final diagnoses:  Atypical chest pain    Afebrile, nontoxic patient with atypical chest pain. The pain is quick, random, occurs at rest, and is located in the right upper chest.  There are no associated symptoms.  Very atypical.  Heart score is 3.  Pt admits she is very stressed and overwhelmed with her life and medical problems and  taking care of her daughter but denies depression.  D/C home with close PCP follow up.  Discussed result, findings, treatment, and follow up  with patient.  Pt given return precautions.  Pt verbalizes understanding and agrees with plan.       I doubt any other EMC precluding discharge at this time including, but not necessarily limited to the following:  ACS, PE, aortic dissection, pneuothorax, pneumonia.    Clayton Bibles, PA-C 05/30/15 2010  Davonna Belling, MD 05/31/15 [phone]

## 2015-05-30 NOTE — ED Notes (Signed)
Pt is in stable condition upon d/c. Once pt was told she was going home she then states she had pain in her shoulder. Both troponin markers were negative and EKG is normal. Pt then states, "You just don't comprehend that I'm in pain. None of ya'll wanna help nobody". Pt was also fighting with her daughter at bedside prior to d/c.

## 2015-05-30 NOTE — Discharge Instructions (Signed)
Read the information below.  You may return to the Emergency Department at any time for worsening condition or any new symptoms that concern you.  If you develop worsening chest pain, shortness of breath, fever, you pass out, or become weak or dizzy, return to the ER for a recheck.    Please call your doctors for a close follow up appointment.     Nonspecific Chest Pain It is often hard to find the cause of chest pain. There is always a chance that your pain could be related to something serious, such as a heart attack or a blood clot in your lungs. Chest pain can also be caused by conditions that are not life-threatening. If you have chest pain, it is very important to follow up with your doctor.  HOME CARE  If you were prescribed an antibiotic medicine, finish it all even if you start to feel better.  Avoid any activities that cause chest pain.  Do not use any tobacco products, including cigarettes, chewing tobacco, or electronic cigarettes. If you need help quitting, ask your doctor.  Do not drink alcohol.  Take medicines only as told by your doctor.  Keep all follow-up visits as told by your doctor. This is important. This includes any further testing if your chest pain does not go away.  Your doctor may tell you to keep your head raised (elevated) while you sleep.  Make lifestyle changes as told by your doctor. These may include:  Getting regular exercise. Ask your doctor to suggest some activities that are safe for you.  Eating a heart-healthy diet. Your doctor or a diet specialist (dietitian) can help you to learn healthy eating options.  Maintaining a healthy weight.  Managing diabetes, if necessary.  Reducing stress. GET HELP IF:  Your chest pain does not go away, even after treatment.  You have a rash with blisters on your chest.  You have a fever. GET HELP RIGHT AWAY IF:  Your chest pain is worse.  You have an increasing cough, or you cough up blood.  You have  severe belly (abdominal) pain.  You feel extremely weak.  You pass out (faint).  You have chills.  You have sudden, unexplained chest discomfort.  You have sudden, unexplained discomfort in your arms, back, neck, or jaw.  You have shortness of breath at any time.  You suddenly start to sweat, or your skin gets clammy.  You feel nauseous.  You vomit.  You suddenly feel light-headed or dizzy.  Your heart begins to beat quickly, or it feels like it is skipping beats. These symptoms may be an emergency. Do not wait to see if the symptoms will go away. Get medical help right away. Call your local emergency services (911 in the [place] Do not drive yourself to the hospital.   This information is not intended to replace advice given to you by your health care provider. Make sure you discuss any questions you have with your health care provider.   Document Released: 07/13/2007 Document Revised: 02/14/2014 Document Reviewed: 08/30/2013 Elsevier Interactive Patient Education [employer].

## 2015-05-30 NOTE — ED Notes (Signed)
Pt reports a "funny feeling" to right arm and chest intermittently since last night. Had episode this am. Is pain free at this time. No acute distress noted. ekg done.

## 2015-05-31 LAB — URINE CULTURE

## 2015-06-01 NOTE — Telephone Encounter (Signed)
Pt finally answered ph, said she didn't need anything, call ended

## 2015-06-03 ENCOUNTER — Other Ambulatory Visit: Payer: Self-pay

## 2015-06-03 DIAGNOSIS — I10 Essential (primary) hypertension: Secondary | ICD-10-CM

## 2015-06-03 MED ORDER — ENALAPRIL MALEATE 10 MG PO TABS: 10.0000 mg | ORAL_TABLET | Freq: Every day | ORAL | Status: DC

## 2015-06-03 NOTE — Telephone Encounter (Signed)
Pt requesting blood pressure med to be filled. °

## 2015-06-03 NOTE — Telephone Encounter (Signed)
Pt was contacted to confirm medication dose and name.  She needs a refill on her enalapril 10mg .   Rx to be sent to [facility].Regenia Skeeter, Carman Essick Cassady4/26/201711:32 AM

## 2015-06-09 ENCOUNTER — Telehealth: Payer: Self-pay

## 2015-06-09 NOTE — Telephone Encounter (Signed)
Pt has schedule for follow up with internal  Medicine 06/10/2015

## 2015-06-09 NOTE — Telephone Encounter (Signed)
Error

## 2015-06-10 ENCOUNTER — Ambulatory Visit: Payer: Self-pay | Admitting: Internal Medicine

## 2015-06-11 ENCOUNTER — Encounter (HOSPITAL_COMMUNITY): Payer: Self-pay | Admitting: Emergency Medicine

## 2015-06-11 ENCOUNTER — Emergency Department (HOSPITAL_COMMUNITY)
Admission: EM | Admit: 2015-06-11 | Discharge: 2015-06-11 | Disposition: A | Discharge status: Home / Self Care | Payer: Medicaid Other | Attending: Emergency Medicine | Admitting: Emergency Medicine

## 2015-06-11 DIAGNOSIS — N183 Chronic kidney disease, stage 3 (moderate): Secondary | ICD-10-CM | POA: Insufficient documentation

## 2015-06-11 DIAGNOSIS — R1031 Right lower quadrant pain: Secondary | ICD-10-CM | POA: Diagnosis present

## 2015-06-11 DIAGNOSIS — E1122 Type 2 diabetes mellitus with diabetic chronic kidney disease: Secondary | ICD-10-CM | POA: Diagnosis not present

## 2015-06-11 DIAGNOSIS — I129 Hypertensive chronic kidney disease with stage 1 through stage 4 chronic kidney disease, or unspecified chronic kidney disease: Secondary | ICD-10-CM | POA: Diagnosis not present

## 2015-06-11 DIAGNOSIS — G8929 Other chronic pain: Secondary | ICD-10-CM | POA: Diagnosis not present

## 2015-06-11 DIAGNOSIS — R110 Nausea: Secondary | ICD-10-CM | POA: Insufficient documentation

## 2015-06-11 NOTE — ED Notes (Signed)
Patient c/o right lower quadrant abdominal pain and nausea intermittent x several weeks. No emesis diarrhea. Pt seen in ED 18 times this month.

## 2015-06-11 NOTE — ED Notes (Signed)
Pt called for vital sign recheck, no response from lobby

## 2015-06-12 ENCOUNTER — Ambulatory Visit (INDEPENDENT_AMBULATORY_CARE_PROVIDER_SITE_OTHER): Payer: Self-pay | Admitting: Internal Medicine

## 2015-06-12 ENCOUNTER — Encounter: Payer: Self-pay | Admitting: Internal Medicine

## 2015-06-12 VITALS — BP 140/80 | HR 88 | Temp 98.5°F | Wt 174.5 lb

## 2015-06-12 DIAGNOSIS — E1159 Type 2 diabetes mellitus with other circulatory complications: Secondary | ICD-10-CM

## 2015-06-12 DIAGNOSIS — I10 Essential (primary) hypertension: Secondary | ICD-10-CM

## 2015-06-12 DIAGNOSIS — E1122 Type 2 diabetes mellitus with diabetic chronic kidney disease: Secondary | ICD-10-CM

## 2015-06-12 DIAGNOSIS — Z9119 Patient's noncompliance with other medical treatment and regimen: Secondary | ICD-10-CM

## 2015-06-12 DIAGNOSIS — I129 Hypertensive chronic kidney disease with stage 1 through stage 4 chronic kidney disease, or unspecified chronic kidney disease: Secondary | ICD-10-CM

## 2015-06-12 DIAGNOSIS — N183 Chronic kidney disease, stage 3 unspecified: Secondary | ICD-10-CM

## 2015-06-12 DIAGNOSIS — E1165 Type 2 diabetes mellitus with hyperglycemia: Secondary | ICD-10-CM

## 2015-06-12 DIAGNOSIS — R1031 Right lower quadrant pain: Secondary | ICD-10-CM | POA: Insufficient documentation

## 2015-06-12 DIAGNOSIS — I152 Hypertension secondary to endocrine disorders: Secondary | ICD-10-CM

## 2015-06-12 LAB — GLUCOSE, CAPILLARY: GLUCOSE-CAPILLARY: 224 mg/dL — AB (ref 65–99)

## 2015-06-12 LAB — POCT GLYCOSYLATED HEMOGLOBIN (HGB A1C): HEMOGLOBIN A1C: 9.6

## 2015-06-12 NOTE — Assessment & Plan Note (Signed)
Her A1c has increased from 8.1 to 9.6. She has canceled multiple appointments and has not been checking her blood sugars frequently due to cost of test strips. Additionally, she has not been taking her Actos. I have asked her to follow up with me next month so we can reassess her blood sugars. She is working on getting the orange card. I will refer her to [facility] Penny for diabetes and nutrition education as well.

## 2015-06-12 NOTE — Patient Instructions (Signed)
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  Medicines may include prescription medications, over-the-counter medications, herbal remedies, eye drops, vitamins, or other pills.   Progress Toward Treatment Goals:  Treatment Goal 03/24/2015  Hemoglobin A1C unchanged  Blood pressure unchanged    Self Care Goals & Plans:  Self Care Goal 06/12/2015  Manage my medications take my medicines as prescribed; bring my medications to every visit; refill my medications on time  Monitor my health keep track of my blood pressure  Eat healthy foods eat more vegetables; eat baked foods instead of fried foods  Be physically active find an activity I enjoy  Prevent falls wear appropriate shoes    Home Blood Glucose Monitoring 03/24/2015  Check my blood sugar -  When to check my blood sugar before breakfast; before dinner     Care Management & Community Referrals:  Referral 06/17/2014  Referrals made for care management support financial counselor

## 2015-06-12 NOTE — Assessment & Plan Note (Addendum)
She did not have any abdominal tenderness on exam and her symptoms are intermittent. She has had numerous episodes of pain like this as well as "frequent UTIs" when UAs have been negative. I think she is likely having somatization in relation to her anxiety. She refuses to accept that she has anxiety or get treatment. I did feel a small firm area that is consistent with a lipoma. She had no tenderness in this area. She states she does not inject her insulin in this area. She had a CT abd/pelvis in June 2016 and a MRI abdomen in Sept 2016 which showed only renal cysts that have been stable, no other abnormalities. I do not feel further imaging is needed at this time. If she continues to have pain consistently in this area then would obtain an US of the soft tissues. 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

## 2015-06-12 NOTE — Progress Notes (Signed)
   Subjective:    Patient ID: [name], female    DOB: 05-Aug-1955, 60 y.o.   MRN: [id number]  HPI Ms. Greiser is a 60yo woman with PMHx of HTN, type 2 DM, CKD stage 3 who presents today for evaluation of right-sided side pain.   RLQ Abdominal Pain: She reports pain in her right lower abdomen that has been ongoing for the last 6 months. She states the pain "comes and goes." She went to [facility] ED last night for the pain but then left before she could be evaluated. She states the pain is not present today. She describes the pain as 5/10 in severity, a "warm sensation", occurs when she sits for a long time, and gets better with Tylenol. She denies any association with food. She does note indigestion though. She denies fevers, chills, constipation, diarrhea, nausea, and vomiting. She denies any injury or muscle-related pain.   HTN: BP is mildly elevated at 149/62 today. Repeat BP 140/80. She is taking Enalapril 10 mg daily.   Type 2 DM: Last A1c 8.1, today her A1c is 9.6. She is taking Lantus 20 units QHS. She reports she has not taken Actos for several months now. She denies any hypoglycemia symptoms or readings on her glucometer. Per review of her glucometer, she only has 5 readings for the last 1 month. She states she ran out of test strips. Her blood sugars range from 103-205 in the morning, no evening readings.    Review of Systems General: Denies night sweats, changes in weight, changes in appetite HEENT: Denies headaches, ear pain, changes in vision, rhinorrhea, sore throat CV: Denies CP, palpitations, SOB, orthopnea Pulm: Denies SOB, cough, wheezing GI: Denies melena, hematochezia GU: Denies dysuria, hematuria, frequency Msk: Denies muscle cramps, joint pains Neuro: Denies weakness, numbness, tingling Skin: Denies rashes, bruising Psych: Denies depression, hallucinations    Objective:   Physical Exam General: sitting up, appears anxious, pressured speech HEENT: [place]/AT, EOMI,  sclera anicteric, mucus membranes moist CV: RRR, 3/6 systolic murmur Pulm: CTA bilaterally, breaths non-labored Abd: BS+, soft, non-tender, non-distended. She has a small firm area on palpation that is located in the skin, non-tender, no erythema present.  Ext: warm, no peripheral edema Neuro: alert and oriented x 3, no focal deficits      Assessment & Plan:  Please refer to A&P documentation.

## 2015-06-12 NOTE — Assessment & Plan Note (Signed)
BP acceptable after repeat check. I have asked her to increase her Enalapril to 20 mg daily at numerous visits but she refuses to do so. Looking at her weight and BP, as her weight has increased so has her BP. She seems motivated to lose weight and talked about being on a diet. We discussed her meeting with Butch Penny for some nutrition education and she was agreeable. I emphasized a low salt diet and avoiding foods that can have salt hidden in them. I will place consult to Butch Penny.

## 2015-06-15 ENCOUNTER — Ambulatory Visit: Payer: Self-pay | Admitting: Obstetrics & Gynecology

## 2015-06-15 ENCOUNTER — Encounter: Payer: Self-pay | Admitting: Obstetrics & Gynecology

## 2015-06-15 VITALS — BP 142/76 | HR 80 | Wt 174.1 lb

## 2015-06-15 DIAGNOSIS — G8929 Other chronic pain: Secondary | ICD-10-CM

## 2015-06-15 DIAGNOSIS — R102 Pelvic and perineal pain: Principal | ICD-10-CM

## 2015-06-15 LAB — POCT URINALYSIS DIP (DEVICE)
Bilirubin Urine: NEGATIVE
Glucose, UA: NEGATIVE mg/dL
Ketones, ur: NEGATIVE mg/dL
NITRITE: NEGATIVE
PH: 6 (ref 5.0–8.0)
Protein, ur: NEGATIVE mg/dL
Specific Gravity, Urine: 1.01 (ref 1.005–1.030)
UROBILINOGEN UA: 0.2 mg/dL (ref 0.0–1.0)

## 2015-06-15 NOTE — Progress Notes (Signed)
Patient was evaluated in [facility]U for pelvic pain, ultrasound was ordered but patient did not go for appointment.  She was told that she will need to return after pelvic ultrasound for evaluation; only reports back pain today.  No acute symptoms. Will return after imaging.   Verita Schneiders, MD, [place] Attending Obstetrician & Gynecologist, [place] [facility]

## 2015-06-15 NOTE — Progress Notes (Signed)
Internal Medicine Clinic Attending  Case discussed with Dr. Rivet soon after the resident saw the patient.  We reviewed the resident's history and exam and pertinent patient test results.  I agree with the assessment, diagnosis, and plan of care documented in the resident's note.  

## 2015-06-16 ENCOUNTER — Ambulatory Visit: Payer: Self-pay | Admitting: Internal Medicine

## 2015-06-18 ENCOUNTER — Telehealth: Payer: Self-pay

## 2015-06-18 NOTE — Telephone Encounter (Signed)
Pt called and wanted to know her results of her urine.  Called and LM that her results she requested are normal if she has any other questions to please give the office a call.

## 2015-06-22 ENCOUNTER — Ambulatory Visit (HOSPITAL_COMMUNITY)
Admission: RE | Admit: 2015-06-22 | Discharge: 2015-06-22 | Disposition: A | Discharge status: Home / Self Care | Payer: Medicaid Other | Source: Ambulatory Visit | Attending: Advanced Practice Midwife | Admitting: Advanced Practice Midwife

## 2015-06-22 DIAGNOSIS — Q513 Bicornate uterus: Secondary | ICD-10-CM

## 2015-06-22 DIAGNOSIS — R102 Pelvic and perineal pain: Secondary | ICD-10-CM | POA: Insufficient documentation

## 2015-06-22 DIAGNOSIS — D251 Intramural leiomyoma of uterus: Secondary | ICD-10-CM | POA: Diagnosis not present

## 2015-06-27 ENCOUNTER — Encounter (HOSPITAL_COMMUNITY): Payer: Self-pay

## 2015-06-27 ENCOUNTER — Emergency Department (HOSPITAL_COMMUNITY)
Admission: EM | Admit: 2015-06-27 | Discharge: 2015-06-27 | Disposition: A | Discharge status: Home / Self Care | Payer: Medicaid Other | Attending: Emergency Medicine | Admitting: Emergency Medicine

## 2015-06-27 ENCOUNTER — Emergency Department (HOSPITAL_COMMUNITY): Payer: Medicaid Other

## 2015-06-27 ENCOUNTER — Telehealth: Payer: Self-pay | Admitting: Pulmonary Disease

## 2015-06-27 DIAGNOSIS — Z8774 Personal history of (corrected) congenital malformations of heart and circulatory system: Secondary | ICD-10-CM | POA: Diagnosis not present

## 2015-06-27 DIAGNOSIS — Z87440 Personal history of urinary (tract) infections: Secondary | ICD-10-CM | POA: Insufficient documentation

## 2015-06-27 DIAGNOSIS — G8929 Other chronic pain: Secondary | ICD-10-CM | POA: Diagnosis not present

## 2015-06-27 DIAGNOSIS — Z862 Personal history of diseases of the blood and blood-forming organs and certain disorders involving the immune mechanism: Secondary | ICD-10-CM | POA: Insufficient documentation

## 2015-06-27 DIAGNOSIS — Q6102 Congenital multiple renal cysts: Secondary | ICD-10-CM | POA: Insufficient documentation

## 2015-06-27 DIAGNOSIS — E119 Type 2 diabetes mellitus without complications: Secondary | ICD-10-CM | POA: Insufficient documentation

## 2015-06-27 DIAGNOSIS — I129 Hypertensive chronic kidney disease with stage 1 through stage 4 chronic kidney disease, or unspecified chronic kidney disease: Secondary | ICD-10-CM | POA: Diagnosis not present

## 2015-06-27 DIAGNOSIS — N183 Chronic kidney disease, stage 3 (moderate): Secondary | ICD-10-CM | POA: Insufficient documentation

## 2015-06-27 DIAGNOSIS — F419 Anxiety disorder, unspecified: Secondary | ICD-10-CM | POA: Diagnosis not present

## 2015-06-27 DIAGNOSIS — F329 Major depressive disorder, single episode, unspecified: Secondary | ICD-10-CM | POA: Diagnosis not present

## 2015-06-27 DIAGNOSIS — Z8719 Personal history of other diseases of the digestive system: Secondary | ICD-10-CM | POA: Insufficient documentation

## 2015-06-27 DIAGNOSIS — R600 Localized edema: Secondary | ICD-10-CM | POA: Insufficient documentation

## 2015-06-27 DIAGNOSIS — Z794 Long term (current) use of insulin: Secondary | ICD-10-CM | POA: Diagnosis not present

## 2015-06-27 DIAGNOSIS — Z79899 Other long term (current) drug therapy: Secondary | ICD-10-CM | POA: Insufficient documentation

## 2015-06-27 DIAGNOSIS — Z8742 Personal history of other diseases of the female genital tract: Secondary | ICD-10-CM | POA: Insufficient documentation

## 2015-06-27 DIAGNOSIS — M7989 Other specified soft tissue disorders: Secondary | ICD-10-CM | POA: Diagnosis present

## 2015-06-27 NOTE — ED Provider Notes (Signed)
CSN: [id number]     Arrival date & time 06/27/15  1500 History   First MD Initiated Contact with Patient 06/27/15 1603     Chief Complaint  Patient presents with  . Leg Swelling     (Consider location/radiation/quality/duration/timing/severity/associated sxs/prior Treatment) HPI.Marland KitchenMarland KitchenMarland KitchenSwelling of left foot for 2 days. No trauma. No fever, sweats, chills. No posterior calf or thigh tenderness. Severity symptoms is mild. Nothing makes symptoms better or worse.  Past Medical History  Diagnosis Date  . Diabetes mellitus type II, uncontrolled ([place])   . Hypertension   . Hyperlipidemia   . Ovarian cyst, left   . Anemia     due to menorrhagia, BL 8-10  . Vaginal cyst     nabothian and bartholin  . Anxiety   . Depression   . Postmenopausal bleeding 06/12/2008  . 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 UTI (lower urinary tract infection)   . IBS (irritable bowel syndrome)   . Chronic back pain   . Chronic abdominal pain   . DDD (degenerative disc disease), lumbar   . Diabetes mellitus without complication ([place])   . History of palpitations     evaluated recently 12'15  . Colitis   . CKD (chronic kidney disease) stage 3, GFR 30-59 ml/min     baseline creatinine 1.4-1.7  . Bilateral renal cysts 01/15/2009    Qualifier: Diagnosis of  By: Tyrell Antonio MD, Jerald Kief     Past Surgical History  Procedure Laterality Date  . Cardiac surgery      to repair congenital defect as a child- 36months old  . Esophagogastroduodenoscopy (egd) with propofol N/A 10/01/2014    Procedure: ESOPHAGOGASTRODUODENOSCOPY (EGD) WITH PROPOFOL;  Surgeon: Arta Silence, MD;  [facility];  Service: Endoscopy;  Laterality: N/A;  . Colonoscopy with propofol N/A 10/01/2014    Procedure: COLONOSCOPY WITH PROPOFOL;  Surgeon: Arta Silence, MD;  Location: [facility];  Service: Endoscopy;  Laterality: N/A;   Family History  Problem Relation Age of Onset  . Stroke Father   . Heart attack Father      Had MI in his 59s  . Stomach cancer Paternal Grandmother   . Diabetes Maternal Grandmother   . Cerebral palsy Daughter   . Anesthesia problems Neg Hx   . Hypotension Neg Hx   . Malignant hyperthermia Neg Hx   . Pseudochol deficiency Neg Hx    Social History  Substance Use Topics  . Smoking status: Never Smoker   . Smokeless tobacco: Never Used  . Alcohol Use: No   OB History    Gravida Para Term Preterm AB TAB SAB Ectopic Multiple Living   3 1 1  2  2   1      Review of Systems  All other systems reviewed and are negative.     Allergies  Review of patient's allergies indicates no known allergies.  Home Medications   Prior to Admission medications   Medication Sig Start Date End Date Taking? Authorizing Provider  acetaminophen (TYLENOL) 650 MG CR tablet Take 1,300 mg by mouth 2 (two) times daily.    Yes Historical Provider, MD  enalapril (VASOTEC) 10 MG tablet Take 1 tablet (10 mg total) by mouth daily. 06/03/15  Yes Carly Montey Hora, MD  gabapentin (NEURONTIN) 300 MG capsule Take 300 mg by mouth 2 (two) times daily.   Yes Historical Provider, MD  Insulin Glargine (LANTUS SOLOSTAR) 100 UNIT/ML Solostar Pen Inject 20 Units into the skin daily  at 10 pm. 03/24/15  Yes Carly J Rivet, MD   BP 118/78 mmHg  Pulse 76  Temp(Src) 98 F (36.7 C) (Oral)  Resp 18  Ht 5\' 3"  (1.6 m)  Wt 174 lb (78.926 kg)  BMI 30.83 kg/m2  SpO2 99% Physical Exam  Constitutional: She is oriented to person, place, and time. She appears well-developed and well-nourished.  HENT:  Head: Normocephalic and atraumatic.  Eyes: Conjunctivae and EOM are normal. Pupils are equal, round, and reactive to light.  Neck: Normal range of motion. Neck supple.  Musculoskeletal:  Minimal left foot edema. No posterior thigh or calf tenderness  Neurological: She is alert and oriented to person, place, and time.  Skin: Skin is warm and dry.  Psychiatric: She has a normal mood and affect. Her behavior is normal.   Nursing note and vitals reviewed.   ED Course  Procedures (including critical care time) Labs Review Labs Reviewed - No data to display  Imaging Review Dg Foot Complete Left  06/27/2015  CLINICAL DATA:  60 year old with left foot swelling, acute onset. No recent injuries. Current history of diabetes and chronic kidney disease. EXAM: LEFT FOOT - COMPLETE 3+ VIEW COMPARISON:  None. FINDINGS: No evidence of acute or subacute fracture. Asymmetric cortical thickening involving the lateral cortex of the shaft of the 2nd metatarsal, with well-preserved bone mineral density throughout. Moderate to severe narrowing of the 1st MTP joint space without associated erosions. Remaining joint spaces well preserved. Very small plantar calcaneal spur. IMPRESSION: 1. No acute or subacute osseous abnormality. 2. Query chronic healed stress reaction involving the 2nd metatarsal. 3. 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 Electronically Signed   By: Evangeline Dakin M.D.   On: 06/27/2015 17:26   I have personally reviewed and evaluated these images and lab results as part of my medical decision-making.   EKG Interpretation None      MDM   Final diagnoses:  Edema of left foot    No clinical evidence of a DVT. Pain films of left foot show no acute osseous abnormality.    Nat Christen, MD 06/27/15 Drema Halon

## 2015-06-27 NOTE — ED Notes (Signed)
Pt verbalized understanding of d/c instructions and has no further questions. Pt stable and NAD.  

## 2015-06-27 NOTE — ED Notes (Signed)
Pt reports 2 days ago top of left foot swollen.  No shortness of breath.

## 2015-06-27 NOTE — Telephone Encounter (Signed)
   Reason for call:   I received a call from [name] at 1:55  PM indicating her left foot is swelling.   Pertinent Data:   "Medium" amount of swelling. Top part of foot, not erythematous or increased warmth.  [name] - just baseline neuropathy  No sores or drainage  No calf swelling. No recent travel.   Able to ambulate  Sweating but her AC at her house isn't working. No fevers/chills. No nausea or vomiting. Dyspnea is at baseline.  Started a few days ago  BP 159/79 this morning  No swelling of her right foot   Assessment / Plan / Recommendations:   She would like to be seen at urgent care to make sure she isn't dehydrated.  I recommended elevation of her extremity to help with the swelling. Doubt that she has a VTE as it is located on the top part of her foot and does not sound like she has an infection.  As always, pt is advised that if symptoms worsen or new symptoms arise, they should go to an urgent care facility or to ER for further evaluation.   Milagros Loll, MD   06/27/2015, 1:43 PM

## 2015-06-27 NOTE — Discharge Instructions (Signed)
X-ray showed no broken bones. Recommend elevating foot. Ice pack. Ace wrap. Follow-up your primary care doctor.

## 2015-06-29 ENCOUNTER — Ambulatory Visit (INDEPENDENT_AMBULATORY_CARE_PROVIDER_SITE_OTHER): Payer: Self-pay | Admitting: Internal Medicine

## 2015-06-29 ENCOUNTER — Encounter: Payer: Self-pay | Admitting: Internal Medicine

## 2015-06-29 VITALS — BP 143/61 | HR 87 | Temp 97.8°F | Ht 63.0 in | Wt 176.7 lb

## 2015-06-29 DIAGNOSIS — R1031 Right lower quadrant pain: Secondary | ICD-10-CM

## 2015-06-29 DIAGNOSIS — M19072 Primary osteoarthritis, left ankle and foot: Secondary | ICD-10-CM

## 2015-06-29 DIAGNOSIS — M1712 Unilateral primary osteoarthritis, left knee: Secondary | ICD-10-CM | POA: Insufficient documentation

## 2015-06-29 MED ORDER — DICLOFENAC SODIUM 1 % TD GEL: 4.0000 g | Freq: Four times a day (QID) | TRANSDERMAL | Status: DC

## 2015-06-29 NOTE — Patient Instructions (Signed)
Ms. Yauch,  It was a [place] meeting you and your daughter today.  For your left knee and foot pain, I am prescribing voltaren gel. Be sure to rub it in real good.  For your diabetes and other issues, please work on obtaining the orange card.  Have a good one.

## 2015-06-29 NOTE — Assessment & Plan Note (Signed)
A: Severe OA on imaging in both left foot and left knee. Patient was instructed to not take oral NSAIDs due to CKD. We will attempt topical NSAIDs at this time.  P: Voltaren gel

## 2015-06-29 NOTE — Assessment & Plan Note (Signed)
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 There is nothing clearly on imaging that can explain her present symptoms. Could represent somatization. Could be driven by anxiety and stress around caring for handicapped daughter.  P: Reassess and discuss anxiety at future visits.

## 2015-06-29 NOTE — Progress Notes (Signed)
   Subjective:    Patient ID: [name], female    DOB: May 15, 1955, 60 y.o.   MRN: [id number]  HPI  [name] is a 60 year old woman with HTN, Type 2 DM, CKD stage 3 who comes to the clinic today for follow up on her RLQ abdominal pain and to address her longstanding left knee and foot pain.  For her RLQ pain, she was seen in clinic on 06/12/15, at the time it was thought he pain was due to somatization. She continued to have pain and an abdominal US was obtained on 5/15 that revealed a bicornuate uterus and a 1.5 cm fibroid. She cannot pinpoint exactly where the pain is coming from. It does not radiate. She denies any GI/GU symptoms as below.  Patient was complaining of left foot swelling reported to ED. There was no clinical evidence of DVT. The swelling has since resolved. A left foot X-ray revealed severe osteoarthritis. She also complains of chronic left knee pain as well, without swelling. A previous knee X-ray showed significant left knee OA.  Review of Systems  Constitutional: Negative for chills and appetite change.  Respiratory: Negative for cough and shortness of breath.   Cardiovascular: Negative for chest pain and leg swelling.  Gastrointestinal: Positive for abdominal pain. Negative for nausea, diarrhea, constipation and blood in stool.  Genitourinary: Negative for hematuria, vaginal bleeding and vaginal discharge.  Musculoskeletal: Positive for back pain and arthralgias.       Objective:   Physical Exam  Cardiovascular: Normal rate, regular rhythm and normal heart sounds.   Pulmonary/Chest: Effort normal and breath sounds normal. No respiratory distress. She has no wheezes.  Abdominal: Soft. Bowel sounds are normal. She exhibits no distension. There is no tenderness. There is no rebound and no guarding.  Musculoskeletal:  Left knee pain against resistance. Left foot pain to palpation. Antalgic gait.  Vitals reviewed.         Assessment & Plan:  Please see  problem based assessment and plan for details.

## 2015-07-01 NOTE — Progress Notes (Signed)
Internal Medicine Clinic Attending  Case discussed with Dr. Ford at the time of the visit.  We reviewed the resident's history and exam and pertinent patient test results.  I agree with the assessment, diagnosis, and plan of care documented in the resident's note.  

## 2015-07-10 ENCOUNTER — Ambulatory Visit: Payer: Self-pay

## 2015-07-28 ENCOUNTER — Ambulatory Visit (INDEPENDENT_AMBULATORY_CARE_PROVIDER_SITE_OTHER): Payer: Self-pay | Admitting: Internal Medicine

## 2015-07-28 ENCOUNTER — Encounter: Payer: Self-pay | Admitting: Internal Medicine

## 2015-07-28 ENCOUNTER — Ambulatory Visit (INDEPENDENT_AMBULATORY_CARE_PROVIDER_SITE_OTHER): Payer: Self-pay | Admitting: Dietician

## 2015-07-28 ENCOUNTER — Telehealth: Payer: Self-pay | Admitting: *Deleted

## 2015-07-28 VITALS — BP 138/75 | HR 86 | Temp 98.4°F | Wt 179.8 lb

## 2015-07-28 VITALS — Ht 63.33 in | Wt 179.8 lb

## 2015-07-28 DIAGNOSIS — E1122 Type 2 diabetes mellitus with diabetic chronic kidney disease: Secondary | ICD-10-CM

## 2015-07-28 DIAGNOSIS — N183 Chronic kidney disease, stage 3 unspecified: Secondary | ICD-10-CM

## 2015-07-28 DIAGNOSIS — Z794 Long term (current) use of insulin: Secondary | ICD-10-CM

## 2015-07-28 DIAGNOSIS — E1159 Type 2 diabetes mellitus with other circulatory complications: Secondary | ICD-10-CM

## 2015-07-28 DIAGNOSIS — I152 Hypertension secondary to endocrine disorders: Secondary | ICD-10-CM

## 2015-07-28 DIAGNOSIS — Z713 Dietary counseling and surveillance: Secondary | ICD-10-CM

## 2015-07-28 DIAGNOSIS — M1712 Unilateral primary osteoarthritis, left knee: Secondary | ICD-10-CM

## 2015-07-28 DIAGNOSIS — E1165 Type 2 diabetes mellitus with hyperglycemia: Secondary | ICD-10-CM

## 2015-07-28 DIAGNOSIS — I10 Essential (primary) hypertension: Principal | ICD-10-CM

## 2015-07-28 LAB — GLUCOSE, CAPILLARY: Glucose-Capillary: 334 mg/dL — ABNORMAL HIGH (ref 65–99)

## 2015-07-28 NOTE — Progress Notes (Signed)
   Subjective:    Patient ID: [name], female    DOB: 06-May-1955, 60 y.o.   MRN: [id number]  HPI [name] is a 60yo woman with PMHx of HTN, uncontrolled type 2 DM, and CKD stage 3 who presents today for follow up of her hypertension.  HTN: BP today is 138/75. She takes Enalapril 10 mg daily. She reports she has been following a low sodium diet. She thinks she has gained more weight and wants to work on this.   Type 2 DM: Last A1c 9.6. She takes Lantus 20 units QHS. She still has not been able to afford test strips as she is waiting for her orange card to come in the mail. She denies symptoms of blurry vision, polyuria, polyphagia, and polydipsia. She is going to meet with Butch Penny today to go over nutrition and diabetes education.   Left Knee Pain: Reports she tried to see [name] (sports medicine) yesterday but was told she had a $100 co-pay so she left the office. She is frustrated due to the pain and feels that nobody will help her unless she has money. She has not been using voltaren gel because she cannot afford it. She has been taking Tylenol extra-strength 2 pills twice daily. She states this does help, but does not significantly reduce the pain. She also has been using an ace bandage which helps. She has tried icing her knee which she states alleviated some of the pain.    Review of Systems General: Denies fever, chills, night sweats, changes in weight, changes in appetite HEENT: Denies headaches, ear pain, changes in vision, rhinorrhea, sore throat CV: Denies CP, palpitations, SOB, orthopnea Pulm: Denies SOB, cough, wheezing GI: Denies abdominal pain, nausea, vomiting, diarrhea, constipation, melena, hematochezia GU: Denies dysuria, hematuria, frequency Msk: Denies muscle cramps Neuro: Denies weakness, numbness, tingling Skin: Denies rashes, bruising Psych: Denies depression, hallucinations    Objective:   Physical Exam General: sitting up, anxious, pressured  speech HEENT: [place]/AT, EOMI, sclera anicteric, mucus membranes moist CV: RRR, 3/6 systolic murmur  Pulm: CTA bilaterally, breaths non-labored Abd: BS+, soft, non-tender Ext: warm, no peripheral edema. Minimal tenderness to palpation of left knee. Ace bandage over left knee.  Neuro: alert and oriented x 3    Assessment & Plan:  Please refer to A&P documentation.

## 2015-07-28 NOTE — Assessment & Plan Note (Signed)
BP acceptable. Her weight has increased another 3 lbs from last month. She has gained a total of 15 lbs over the last year. She seems motivated to lose the weight but finances seem to hold her back. She states she is not able to afford good food. She is going to meet with Butch Penny today and hopefully can work with Butch Penny on finding some budget-friendly options. Continue Enalapril 10 mg daily.

## 2015-07-28 NOTE — Telephone Encounter (Signed)
Phone note started in error when trying to follow up on referral - no phone call made Yvonna Alanis, RN, 07/28/15, 6:35 P

## 2015-07-28 NOTE — Progress Notes (Signed)
  Medical Nutrition Therapy:  Appt start time: 1500  end time:  [id number] Visit # 2 Assessment:  Primary concerns today: blood sugar control and eight Aaralynn was upset about her knee pain. She wants to stop gaining weight and lose some weight, but doesn't;t feel like she eats enough and that might be causing her weight gain. Her activity is very limited. She eats out daily for lunch and remembers looking up foods in the fast food booklwet.   Preferred Learning Style:visual and auditory Learning Readiness: Contemplating   ANTHROPOMETRICS: weight-179.8#,  [id number]", [id number]. She has gained 13# in past 4-5 months WEIGHT HISTORY:not discussed today SLEEP:not addressed today MEDICATIONS: lantus 20 units daily BLOOD SUGAR: high today-334 after peanuts DIETARY INTAKE: Usual eating pattern includes 3 meals and 1-2 snacks per day. 24-hr recall:  B ( 11 AM): cheerios, milk,  Peaches but not sure of portions ( 300-400 calories) L ( PM): sliders at [employer], pringles instead of fries and diet soda (~ 400-500 calories) Snk ( PM): pack of peanuts ( 200 calories)  D ( PM): st[employer]ffer[employer] lasagna, vegetables, diet soda or water  Beverages: diet soda or water Usual physical activity: always busy running around with daughter, but no formal planned activity  Estimated daily calorie needs  1,644 Calories/day to maintain weight.   1,144 Calories/day to lose 1 lb per week   Progress Towards Goal(s):  In progress.   Nutritional Diagnosis:  [place]-2.2 Altered nutrition-related laboratory As related to higher A1C than her goal.  As evidenced by her a1c>8%.    Intervention:  Nutrition counseling about weight loss and calorie needs and how to follow a calorie restricted meal plan.   Coordination of care- none needed today  Teaching Method Utilized: Visual, Auditory Handouts given during visit include: Barriers to learning/adherence to lifestyle change: lack of support to make healthier choices Demonstrated degree  of understanding via:  Teach Back   Monitoring/Evaluation:  Dietary intake, exercise,meter, and body weight same day as next doctor appointment.

## 2015-07-28 NOTE — Patient Instructions (Signed)
Calorie Counting for Weight Loss  Calories are energy you get from the things you eat and drink.   . When you eat more calories than your body needs, your body stores the extra calories as fat. When you eat fewer calories than your body needs, your body burns fat to get the energy it needs.  Calorie counting means keeping track of how many calories you eat and drink each day. If you make sure to eat fewer calories than your body needs, you should lose weight. A healthy amount of weight to lose per week is usually 1-2 lb (0.5-0.9 kg).   WHAT IS MY PLAN? My goal is to have _1200-1400____ calories per day.  If I have this many calories per day, I should lose around __1/2 to_1____ pound per week.  Breakfast: 300 calories Lunch: 400 calories Dinner: 400 calories Snack: 100 calories    WHAT DO I NEED TO KNOW ABOUT CALORIE COUNTING? In order to meet your daily calorie goal, you will need to:  Find out how many calories are in each food you would like to eat. Try to do this before you eat.  Decide how much of the food you can eat.   WHERE DO I FIND CALORIE INFORMATION? The number of calories in a food can be found on a Nutrition Facts label. Note that all the information on a label is based on a specific serving of the food. If a food does not have a Nutrition Facts label, try to look up the calories online or ask your dietitian for help.  HOW DO I DECIDE HOW MUCH TO EAT? To decide how much of the food you can eat, you will need to consider both the number of calories in one serving and the size of one serving. This information can be found on the Nutrition Facts label.  Remember that calories are listed per serving. If you choose to have more than one serving of a food, you will have to multiply the calories per serving by the amount of servings you plan to eat.  For example, the label on a package of bread might say that a serving size is 1 slice and that there are 90 calories in a  serving. If you eat 1 slice, you will have eaten 90 calories. If you eat 2 slices, you will have eaten 180 calories.  See you at your next visit!   Butch Penny  [phone]

## 2015-07-28 NOTE — Assessment & Plan Note (Addendum)
Unable to assess her blood sugars as she still has not picked up any test strips due to finances. Will continue Lantus 20 units QHS for now. I advised her to pick up test strips as soon as possible once she gets her orange card and to test at least twice daily (once in AM and once in PM).

## 2015-07-28 NOTE — Patient Instructions (Signed)
General Instructions: - Please pick up test strips when you are able to afford them. Test two times daily when you get the strips. - Try taking Tylenol extra strength 2 tablets three times daily for your knee pain - Continuing the ACE bandage - Try icing your knee or using a hot pad to help the pain  Please bring your medicines with you each time you come to clinic.  Medicines may include prescription medications, over-the-counter medications, herbal remedies, eye drops, vitamins, or other pills.   Progress Toward Treatment Goals:  Treatment Goal 03/24/2015  Hemoglobin A1C unchanged  Blood pressure unchanged    Self Care Goals & Plans:  Self Care Goal 06/12/2015  Manage my medications take my medicines as prescribed; bring my medications to every visit; refill my medications on time  Monitor my health keep track of my blood pressure; keep track of my blood glucose; bring my glucose meter and log to each visit  Eat healthy foods eat more vegetables; eat baked foods instead of fried foods; eat foods that are low in salt  Be physically active find an activity I enjoy  Prevent falls wear appropriate shoes    Home Blood Glucose Monitoring 03/24/2015  Check my blood sugar -  When to check my blood sugar before breakfast; before dinner     Care Management & Community Referrals:  Referral 06/17/2014  Referrals made for care management support financial counselor

## 2015-07-29 ENCOUNTER — Encounter: Payer: Self-pay | Admitting: Internal Medicine

## 2015-07-29 NOTE — Assessment & Plan Note (Addendum)
She gets some relief with extra-strength Tylenol and she is not able to afford most other medication options (voltaren gel, lidocaine patch, etc) and cannot take NSAIDs with her CKD. Advised her to increase her Tylenol to 1000 mg TID. Continue ace bandage for support. Can discuss steroid injection at next visit. I believe she has received these before at her sports medicine doctor's office.

## 2015-07-31 ENCOUNTER — Encounter: Payer: Self-pay | Admitting: Dietician

## 2015-07-31 NOTE — Progress Notes (Signed)
Retinal images done and transmitted.  

## 2015-08-04 NOTE — Progress Notes (Signed)
[facility] Clinic Attending  Case discussed with Dr. Rivet at the time of the visit.  We reviewed the resident's history and exam and pertinent patient test results.  I agree with the assessment, diagnosis, and plan of care documented in the resident's note.  

## 2015-08-05 ENCOUNTER — Other Ambulatory Visit: Payer: Self-pay

## 2015-08-05 NOTE — Telephone Encounter (Signed)
Pt states she needs [facility] for [facility]

## 2015-08-06 ENCOUNTER — Other Ambulatory Visit: Payer: Self-pay | Admitting: *Deleted

## 2015-08-06 MED ORDER — PEN NEEDLES 31G X 5 MM MISC: 1.0000 | Freq: Every day | Status: DC

## 2015-08-06 NOTE — Telephone Encounter (Signed)
Done in new enc

## 2015-08-09 ENCOUNTER — Ambulatory Visit (HOSPITAL_COMMUNITY)
Admission: EM | Admit: 2015-08-09 | Discharge: 2015-08-09 | Disposition: A | Discharge status: Home / Self Care | Payer: Medicaid Other | Attending: Family Medicine | Admitting: Family Medicine

## 2015-08-09 ENCOUNTER — Encounter (HOSPITAL_COMMUNITY): Payer: Self-pay | Admitting: *Deleted

## 2015-08-09 DIAGNOSIS — E1122 Type 2 diabetes mellitus with diabetic chronic kidney disease: Secondary | ICD-10-CM | POA: Diagnosis not present

## 2015-08-09 DIAGNOSIS — Z79899 Other long term (current) drug therapy: Secondary | ICD-10-CM | POA: Insufficient documentation

## 2015-08-09 DIAGNOSIS — R8290 Unspecified abnormal findings in urine: Secondary | ICD-10-CM | POA: Insufficient documentation

## 2015-08-09 DIAGNOSIS — Z794 Long term (current) use of insulin: Secondary | ICD-10-CM | POA: Insufficient documentation

## 2015-08-09 DIAGNOSIS — Z823 Family history of stroke: Secondary | ICD-10-CM | POA: Diagnosis not present

## 2015-08-09 DIAGNOSIS — E785 Hyperlipidemia, unspecified: Secondary | ICD-10-CM | POA: Insufficient documentation

## 2015-08-09 DIAGNOSIS — Z8249 Family history of ischemic heart disease and other diseases of the circulatory system: Secondary | ICD-10-CM | POA: Diagnosis not present

## 2015-08-09 DIAGNOSIS — Z87440 Personal history of urinary (tract) infections: Secondary | ICD-10-CM | POA: Insufficient documentation

## 2015-08-09 DIAGNOSIS — M549 Dorsalgia, unspecified: Secondary | ICD-10-CM | POA: Diagnosis present

## 2015-08-09 DIAGNOSIS — N390 Urinary tract infection, site not specified: Secondary | ICD-10-CM

## 2015-08-09 DIAGNOSIS — N183 Chronic kidney disease, stage 3 (moderate): Secondary | ICD-10-CM | POA: Diagnosis not present

## 2015-08-09 DIAGNOSIS — I129 Hypertensive chronic kidney disease with stage 1 through stage 4 chronic kidney disease, or unspecified chronic kidney disease: Secondary | ICD-10-CM | POA: Diagnosis not present

## 2015-08-09 DIAGNOSIS — R8281 Pyuria: Secondary | ICD-10-CM

## 2015-08-09 DIAGNOSIS — K589 Irritable bowel syndrome without diarrhea: Secondary | ICD-10-CM | POA: Diagnosis not present

## 2015-08-09 DIAGNOSIS — M5136 Other intervertebral disc degeneration, lumbar region: Secondary | ICD-10-CM | POA: Diagnosis not present

## 2015-08-09 LAB — POCT URINALYSIS DIP (DEVICE)
BILIRUBIN URINE: NEGATIVE
Glucose, UA: NEGATIVE mg/dL
HGB URINE DIPSTICK: NEGATIVE
KETONES UR: NEGATIVE mg/dL
Nitrite: NEGATIVE
PH: 5 (ref 5.0–8.0)
Protein, ur: NEGATIVE mg/dL
Specific Gravity, Urine: 1.01 (ref 1.005–1.030)
Urobilinogen, UA: 0.2 mg/dL (ref 0.0–1.0)

## 2015-08-09 NOTE — ED Provider Notes (Signed)
CSN: [id number]     Arrival date & time 08/09/15  1311 History   First MD Initiated Contact with Patient 08/09/15 1356     Chief Complaint  Patient presents with  . Back Pain   HPI [name] is a 60 y.o. female presenting for foul urine odor.   She reports her urine smelled strong this morning and her daughter recommended she go to have this worked up. She denies dysuria, increasing urinary frequency or urgency. No fever, abd pain, back pain, N/V/D, vaginal bleeding or discharge or hematuria. She has IDDM and has run out of strips so doesn't check her CBGs. She denies polyuria, polydipsia, weight gain or loss. She's been drinking diet sodas and not much water.   Past Medical History  Diagnosis Date  . Diabetes mellitus type II, uncontrolled ([place])   . Hypertension   . Hyperlipidemia   . Ovarian cyst, left   . Anemia     due to menorrhagia, BL 8-10  . Vaginal cyst     nabothian and bartholin  . Anxiety   . Depression   . Postmenopausal bleeding 06/12/2008  . 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 UTI (lower urinary tract infection)   . IBS (irritable bowel syndrome)   . Chronic back pain   . Chronic abdominal pain   . DDD (degenerative disc disease), lumbar   . Diabetes mellitus without complication ([place])   . History of palpitations     evaluated recently 12'15  . Colitis   . CKD (chronic kidney disease) stage 3, GFR 30-59 ml/min     baseline creatinine 1.4-1.7  . Bilateral renal cysts 01/15/2009    Qualifier: Diagnosis of  By: Tyrell Antonio MD, Jerald Kief     Past Surgical History  Procedure Laterality Date  . Esophagogastroduodenoscopy (egd) with propofol N/A 10/01/2014    Procedure: ESOPHAGOGASTRODUODENOSCOPY (EGD) WITH PROPOFOL;  Surgeon: Arta Silence, MD;  Location: [facility] ENDOSCOPY;  Service: Endoscopy;  Laterality: N/A;  . Colonoscopy with propofol N/A 10/01/2014    Procedure: COLONOSCOPY WITH PROPOFOL;  Surgeon: Arta Silence, MD;  Location: [facility]  ENDOSCOPY;  Service: Endoscopy;  Laterality: N/A;  . Cardiac surgery      to repair congenital defect as a child- 12months old   Family History  Problem Relation Age of Onset  . Stroke Father   . Heart attack Father     Had MI in his 66s  . Stomach cancer Paternal Grandmother   . Diabetes Maternal Grandmother   . Cerebral palsy Daughter   . Anesthesia problems Neg Hx   . Hypotension Neg Hx   . Malignant hyperthermia Neg Hx   . Pseudochol deficiency Neg Hx    Social History  Substance Use Topics  . Smoking status: Never Smoker   . Smokeless tobacco: Never Used  . Alcohol Use: No   OB History    Gravida Para Term Preterm AB TAB SAB Ectopic Multiple Living   3 1 1  2  2   1      Review of Systems: as above  Allergies  Review of patient's allergies indicates no known allergies.  Home Medications   Prior to Admission medications   Medication Sig Start Date End Date Taking? Authorizing Provider  acetaminophen (TYLENOL) 650 MG CR tablet Take 1,300 mg by mouth 2 (two) times daily.    Yes Historical Provider, MD  enalapril (VASOTEC) 10 MG tablet Take 1 tablet (10 mg total) by mouth daily. 06/03/15  Yes Juliet Rude, MD  gabapentin (NEURONTIN) 300 MG capsule Take 300 mg by mouth 2 (two) times daily.   Yes Historical Provider, MD  Insulin Glargine (LANTUS SOLOSTAR) 100 UNIT/ML Solostar Pen Inject 20 Units into the skin daily at 10 pm. 03/24/15  Yes Carly Montey Hora, MD  Insulin Pen Needle (PEN NEEDLES) 31G X 5 MM MISC 1 Syringe by Does not apply route daily at 10 pm. Use to inject insulin into the skin daily at 10 pm. diag code E11.22. Insulin dependent 08/06/15  Yes Carly J Rivet, MD  diclofenac sodium (VOLTAREN) 1 % GEL Apply 4 g topically 4 (four) times daily. 06/29/15   Liberty Handy, MD   Meds Ordered and Administered this Visit  Medications - No data to display  BP 133/72 mmHg  Pulse 81  Temp(Src) 97.7 F (36.5 C) (Oral)  Resp 16  SpO2 98% No data found.   Physical Exam   Constitutional: She is oriented to person, place, and time. She appears well-developed and well-nourished. No distress.  Eyes: EOM are normal. Pupils are equal, round, and reactive to light. No scleral icterus.  Neck: Neck supple. No JVD present.  Cardiovascular: Normal rate, regular rhythm, normal heart sounds and intact distal pulses.   No murmur heard. Pulmonary/Chest: Effort normal and breath sounds normal. No respiratory distress.  Abdominal: Soft. Bowel sounds are normal. She exhibits no distension. There is no tenderness.  Musculoskeletal: Normal range of motion. She exhibits no edema or tenderness.  Lymphadenopathy:    She has no cervical adenopathy.  Neurological: She is alert and oriented to person, place, and time. She exhibits normal muscle tone.  Skin: Skin is warm and dry.  Vitals reviewed.   ED Course  Procedures (including critical care time)  Labs Review Labs Reviewed  POCT URINALYSIS DIP (DEVICE) - Abnormal; Notable for the following:    Leukocytes, UA TRACE (*)    All other components within normal limits   MDM  No diagnosis found.  60 y.o. female here for strong urine odor. Only trace leukocytes on dipstick and no true symptoms of UTI. Will send urine to culture and call with treatment options if positive. NKDA, so would likely start with keflex if positive depending on sensitivities. Urged to drink more water. CBG not likely above 180mg /dl as no glucosuria, but also urged to follow up with PCP as her diabetes has not been well controlled. Patient voiced understanding and will return if any symptoms arise.     [name], MD 08/09/15 [phone]

## 2015-08-09 NOTE — Discharge Instructions (Signed)
We will send your urine sample to culture to make sure there is no infection and call you if this is positive.   you should start drinking more water and stay away from sugars and limit carbohydrates.   Schedule follow up with Dr. Arcelia Jew as soon as possible to continue managing your diabetes. You can call your pharmacy to ask for refills of glucose test strips.

## 2015-08-09 NOTE — ED Notes (Signed)
C/O low back pain x "weeks" - "but I have a pinched nerve"; family member concerned for possible UTI.  Denies any dysuria, fevers, polyuria.

## 2015-08-10 LAB — URINE CULTURE

## 2015-09-11 ENCOUNTER — Other Ambulatory Visit: Payer: Self-pay | Admitting: Internal Medicine

## 2015-09-11 ENCOUNTER — Telehealth: Payer: Self-pay | Admitting: Internal Medicine

## 2015-09-11 DIAGNOSIS — I10 Essential (primary) hypertension: Secondary | ICD-10-CM

## 2015-09-11 MED ORDER — ENALAPRIL MALEATE 10 MG PO TABS: 10.0000 mg | ORAL_TABLET | Freq: Every day | ORAL | 2 refills | Status: DC

## 2015-09-11 NOTE — Telephone Encounter (Signed)
INTERNAL MEDICINE RESIDENCY PROGRAM After-Hours Telephone Call    Reason for call:   I received a call from Ms. Maximino Greenland on 09/11/2015 at 4:50 PM requesting refill of BP med.    Pertinent Data:   Hx of HTN. On enalapril, states BP was mildly elevated last night. She only has 1 tab left of BP and is requesting refill.     Assessment / Plan / Recommendations:   Sent in refill for enalapril.   As always, pt is advised that if symptoms worsen or new symptoms arise, they should go to an urgent care facility or to to ER for further evaluation.    Norman Herrlich, MD   09/11/2015, 4:50 PM

## 2015-09-20 ENCOUNTER — Encounter (HOSPITAL_COMMUNITY): Payer: Self-pay | Admitting: Emergency Medicine

## 2015-09-20 ENCOUNTER — Ambulatory Visit (HOSPITAL_COMMUNITY)
Admission: EM | Admit: 2015-09-20 | Discharge: 2015-09-20 | Disposition: A | Discharge status: Home / Self Care | Payer: Self-pay | Attending: Family Medicine | Admitting: Family Medicine

## 2015-09-20 DIAGNOSIS — R739 Hyperglycemia, unspecified: Secondary | ICD-10-CM

## 2015-09-20 DIAGNOSIS — N390 Urinary tract infection, site not specified: Secondary | ICD-10-CM

## 2015-09-20 LAB — POCT I-STAT, CHEM 8
BUN: 43 mg/dL — ABNORMAL HIGH (ref 6–20)
CALCIUM ION: 1.2 mmol/L (ref 1.13–1.30)
CHLORIDE: 96 mmol/L — AB (ref 101–111)
CREATININE: 1.5 mg/dL — AB (ref 0.44–1.00)
GLUCOSE: 354 mg/dL — AB (ref 65–99)
HCT: 39 % (ref 36.0–46.0)
HEMOGLOBIN: 13.3 g/dL (ref 12.0–15.0)
POTASSIUM: 4.9 mmol/L (ref 3.5–5.1)
SODIUM: 135 mmol/L (ref 135–145)
TCO2: 28 mmol/L (ref 0–100)

## 2015-09-20 LAB — POCT URINALYSIS DIP (DEVICE)
BILIRUBIN URINE: NEGATIVE
Glucose, UA: 500 mg/dL — AB
HGB URINE DIPSTICK: NEGATIVE
KETONES UR: NEGATIVE mg/dL
Nitrite: NEGATIVE
PH: 5.5 (ref 5.0–8.0)
PROTEIN: NEGATIVE mg/dL
SPECIFIC GRAVITY, URINE: 1.01 (ref 1.005–1.030)
Urobilinogen, UA: 0.2 mg/dL (ref 0.0–1.0)

## 2015-09-20 MED ORDER — CEPHALEXIN 500 MG PO CAPS: 500.0000 mg | ORAL_CAPSULE | Freq: Two times a day (BID) | ORAL | 0 refills | Status: DC

## 2015-09-20 NOTE — ED Provider Notes (Signed)
[place]    CSN: [id number] Arrival date & time: 09/20/15  1729  First Provider Contact:  First MD Initiated Contact with Patient 09/20/15 1915     History   Chief Complaint Chief Complaint  Patient presents with  . Abdominal Pain   HPI [name] is a 60 y.o. female presenting for abd pain.   She reports her abdominal pain started in the RLQ and radiated suprapubically, was intermittent, worse with palpation, mild-moderate, "achy," and resolved this afternoon after approximately 6 hours. This has been her presntation for UTI in the past, so she came to be evaluated for UTI. Denies dysuria. + polyuria/urinary frequency without urgency. Urine character normal. No vaginal bleeding/discharge. No fever, N/V/D/constipation, blood in stool or urine. Has been checking CBGs once daily, usually in mid 100's fasting AM readings with anywhere up to 250's in the PM postprandial. Has not checked today but recently ate heavy meal.    HPI  Past Medical History:  Diagnosis Date  . Anemia    due to menorrhagia, BL 8-10  . Anxiety   . Bilateral renal cysts 01/15/2009   Qualifier: Diagnosis of  By: Tyrell Antonio MD, [place]    . Chronic abdominal pain   . Chronic back pain   . CKD (chronic kidney disease) stage 3, GFR 30-59 ml/min    baseline creatinine 1.4-1.7  . Colitis   . Congenital heart defect    surgically corrected as a child  . DDD (degenerative disc disease), lumbar   . Depression   . Diabetes mellitus type II, uncontrolled ([place])   . Diabetes mellitus without complication ([place])   . History of palpitations    evaluated recently 12'15  . Hyperlipidemia   . Hypertension   . IBS (irritable bowel syndrome)   . Ovarian cyst, left   . Postmenopausal bleeding 06/12/2008  . UTI (lower urinary tract infection)   . Vaginal cyst    nabothian and bartholin    Patient Active Problem List   Diagnosis Date Noted  . Osteoarthritis of left knee 06/29/2015  . Bicornuate uterus  06/22/2015  . RLQ abdominal pain 06/12/2015  . Hyperkalemia 04/27/2015  . Recurrent UTI (urinary tract infection) 12/24/2014  . Hyponatremia 09/12/2014  . GERD (gastroesophageal reflux disease) 01/06/2014  . Urge incontinence 11/26/2013  . Chronic pain 09/13/2012  . Preventative health care 11/03/2010  . Bilateral renal cysts 01/15/2009  . Anxiety state 06/12/2008  . CKD (chronic kidney disease) stage 3, GFR 30-59 ml/min 06/12/2008  . Diabetes mellitus with stage 3 chronic kidney disease ([place]) 12/28/2005  . Hyperlipidemia associated with type 2 diabetes mellitus ([place]) 12/28/2005  . Hypertension associated with diabetes ([place]) 12/28/2005    Past Surgical History:  Procedure Laterality Date  . CARDIAC SURGERY     to repair congenital defect as a child- 38months old  . COLONOSCOPY WITH PROPOFOL N/A 10/01/2014   Procedure: COLONOSCOPY WITH PROPOFOL;  Surgeon: Arta Silence, MD;  Location: [facility];  Service: Endoscopy;  Laterality: N/A;  . ESOPHAGOGASTRODUODENOSCOPY (EGD) WITH PROPOFOL N/A 10/01/2014   Procedure: ESOPHAGOGASTRODUODENOSCOPY (EGD) WITH PROPOFOL;  Surgeon: Arta Silence, MD;  Location: [facility];  Service: Endoscopy;  Laterality: N/A;    OB History    Gravida Para Term Preterm AB Living   3 1 1   2 1    SAB TAB Ectopic Multiple Live Births   2       1       Home Medications    Prior to Admission medications  Medication Sig Start Date End Date Taking? Authorizing Provider  acetaminophen (TYLENOL) 650 MG CR tablet Take 1,300 mg by mouth 2 (two) times daily.     Historical Provider, MD  cephALEXin (KEFLEX) 500 MG capsule Take 1 capsule (500 mg total) by mouth 2 (two) times daily. 09/20/15   [name], MD  diclofenac sodium (VOLTAREN) 1 % GEL Apply 4 g topically 4 (four) times daily. 06/29/15   Liberty Handy, MD  enalapril (VASOTEC) 10 MG tablet Take 1 tablet (10 mg total) by mouth daily. 09/11/15   Norman Herrlich, MD  gabapentin (NEURONTIN) 300 MG capsule Take 300  mg by mouth 2 (two) times daily.    Historical Provider, MD  Insulin Glargine (LANTUS SOLOSTAR) 100 UNIT/ML Solostar Pen Inject 20 Units into the skin daily at 10 pm. 03/24/15   Juliet Rude, MD  Insulin Pen Needle (PEN NEEDLES) 31G X 5 MM MISC 1 Syringe by Does not apply route daily at 10 pm. Use to inject insulin into the skin daily at 10 pm. diag code E11.22. Insulin dependent 08/06/15   Juliet Rude, MD    Family History Family History  Problem Relation Age of Onset  . Stroke Father   . Heart attack Father     Had MI in his 12s  . Stomach cancer Paternal Grandmother   . Diabetes Maternal Grandmother   . Cerebral palsy Daughter   . Anesthesia problems Neg Hx   . Hypotension Neg Hx   . Malignant hyperthermia Neg Hx   . Pseudochol deficiency Neg Hx     Social History Social History  Substance Use Topics  . Smoking status: Never Smoker  . Smokeless tobacco: Never Used  . Alcohol use No     Allergies   Review of patient's allergies indicates no known allergies.   Review of Systems Review of Systems As above  Physical Exam Triage Vital Signs ED Triage Vitals  Enc Vitals Group     BP 09/20/15 1823 134/75     Pulse Rate 09/20/15 1823 85     Resp 09/20/15 1823 16     Temp 09/20/15 1823 98.8 F (37.1 C)     Temp Source 09/20/15 1823 Oral     SpO2 09/20/15 1823 98 %     Weight --      Height --      Head Circumference --      Peak Flow --      Pain Score 09/20/15 1845 3     Pain Loc --      Pain Edu? --      Excl. in [place]? --    No data found.   Updated Vital Signs BP 134/75 (BP Location: Left Arm)   Pulse 85   Temp 98.8 F (37.1 C) (Oral)   Resp 16   SpO2 98%   Physical Exam  Constitutional: She is oriented to person, place, and time. She appears well-developed and well-nourished. No distress.  Eyes: Conjunctivae and EOM are normal. Pupils are equal, round, and reactive to light. No scleral icterus.  Neck: Normal range of motion. Neck supple. No JVD  present.  Cardiovascular: Normal rate, regular rhythm, normal heart sounds and intact distal pulses.   Pulmonary/Chest: Effort normal and breath sounds normal. No respiratory distress.  Abdominal: Soft. Bowel sounds are normal. She exhibits no distension. There is tenderness (mild suprapubic. None in RLQ or elsewhere.). There is no rebound and no guarding.  Musculoskeletal: Normal range of  motion. She exhibits no edema or tenderness.  Lymphadenopathy:    She has no cervical adenopathy.  Neurological: She is alert and oriented to person, place, and time. She exhibits normal muscle tone.  Skin: Skin is warm and dry. Capillary refill takes less than 2 seconds.  Vitals reviewed.  UC Treatments / Results  Labs (all labs ordered are listed, but only abnormal results are displayed) Labs Reviewed  POCT URINALYSIS DIP (DEVICE) - Abnormal; Notable for the following:       Result Value   Glucose, UA 500 (*)    Leukocytes, UA MODERATE (*)    All other components within normal limits  POCT I-STAT, CHEM 8 - Abnormal; Notable for the following:    Chloride 96 (*)    BUN 43 (*)    Creatinine, Ser 1.50 (*)    Glucose, Bld 354 (*)    All other components within normal limits    EKG  EKG Interpretation None       Radiology No results found.  Procedures Procedures (including critical care time)  Medications Ordered in UC Medications - No data to display   Initial Impression / Assessment and Plan / UC Course  I have reviewed the triage vital signs and the nursing notes.  Pertinent labs & imaging results that were available during my care of the patient were reviewed by me and considered in my medical decision making (see chart for details).  Final Clinical Impressions(s) / UC Diagnoses   Final diagnoses:  UTI (lower urinary tract infection)  Hyperglycemia   60 y.o. female with now resolved abd pain still with mild suprapubic tenderness and evidence of lower UTI. Also with  glucosuria, so chem8 drawn showing no evidence of acidosis and glucose 354 (her reported normal). Cr elevated but improved from 57mo ago. No hyponatremia/hyperkalemia. Urged to return to PCP for ongoing DM management. Not in DKA/HONC.  - Keflex 500mg  BID - Follow urine culture - F/u with PCP  New Prescriptions New Prescriptions   CEPHALEXIN (KEFLEX) 500 MG CAPSULE    Take 1 capsule (500 mg total) by mouth 2 (two) times daily.     [name], MD 09/20/15 2003

## 2015-09-20 NOTE — ED Triage Notes (Signed)
PT reports RLQ pain and chronic low back pain. PT reports her daughter wants her to be checked for a UTI, but PT does not think she has one. PT has been having regular BMS. PT denies N/V/D

## 2015-10-03 ENCOUNTER — Emergency Department (HOSPITAL_COMMUNITY)
Admission: EM | Admit: 2015-10-03 | Discharge: 2015-10-03 | Disposition: A | Discharge status: Home / Self Care | Payer: Medicaid Other | Attending: Emergency Medicine | Admitting: Emergency Medicine

## 2015-10-03 ENCOUNTER — Encounter (HOSPITAL_COMMUNITY): Payer: Self-pay | Admitting: Emergency Medicine

## 2015-10-03 DIAGNOSIS — Z794 Long term (current) use of insulin: Secondary | ICD-10-CM | POA: Insufficient documentation

## 2015-10-03 DIAGNOSIS — E1122 Type 2 diabetes mellitus with diabetic chronic kidney disease: Secondary | ICD-10-CM | POA: Diagnosis not present

## 2015-10-03 DIAGNOSIS — N183 Chronic kidney disease, stage 3 (moderate): Secondary | ICD-10-CM | POA: Diagnosis not present

## 2015-10-03 DIAGNOSIS — R1031 Right lower quadrant pain: Secondary | ICD-10-CM

## 2015-10-03 DIAGNOSIS — I129 Hypertensive chronic kidney disease with stage 1 through stage 4 chronic kidney disease, or unspecified chronic kidney disease: Secondary | ICD-10-CM | POA: Diagnosis not present

## 2015-10-03 DIAGNOSIS — Z79899 Other long term (current) drug therapy: Secondary | ICD-10-CM | POA: Insufficient documentation

## 2015-10-03 LAB — URINE MICROSCOPIC-ADD ON

## 2015-10-03 LAB — URINALYSIS, ROUTINE W REFLEX MICROSCOPIC
Bilirubin Urine: NEGATIVE
Glucose, UA: 100 mg/dL — AB
HGB URINE DIPSTICK: NEGATIVE
Ketones, ur: NEGATIVE mg/dL
Nitrite: NEGATIVE
PROTEIN: NEGATIVE mg/dL
SPECIFIC GRAVITY, URINE: 1.013 (ref 1.005–1.030)
pH: 5.5 (ref 5.0–8.0)

## 2015-10-03 LAB — CBC
HCT: 38 % (ref 36.0–46.0)
HEMOGLOBIN: 12.9 g/dL (ref 12.0–15.0)
MCH: 30.2 pg (ref 26.0–34.0)
MCHC: 33.9 g/dL (ref 30.0–36.0)
MCV: 89 fL (ref 78.0–100.0)
PLATELETS: 253 10*3/uL (ref 150–400)
RBC: 4.27 MIL/uL (ref 3.87–5.11)
RDW: 12.6 % (ref 11.5–15.5)
WBC: 9.6 10*3/uL (ref 4.0–10.5)

## 2015-10-03 LAB — COMPREHENSIVE METABOLIC PANEL
ALT: 25 U/L (ref 14–54)
ANION GAP: 8 (ref 5–15)
AST: 32 U/L (ref 15–41)
Albumin: 4.4 g/dL (ref 3.5–5.0)
Alkaline Phosphatase: 111 U/L (ref 38–126)
BUN: 43 mg/dL — ABNORMAL HIGH (ref 6–20)
CHLORIDE: 96 mmol/L — AB (ref 101–111)
CO2: 29 mmol/L (ref 22–32)
CREATININE: 1.6 mg/dL — AB (ref 0.44–1.00)
Calcium: 9 mg/dL (ref 8.9–10.3)
GFR, EST AFRICAN AMERICAN: 40 mL/min — AB (ref >60)
GFR, EST NON AFRICAN AMERICAN: 34 mL/min — AB (ref >60)
Glucose, Bld: 323 mg/dL — ABNORMAL HIGH (ref 65–99)
Potassium: 4.2 mmol/L (ref 3.5–5.1)
SODIUM: 133 mmol/L — AB (ref 135–145)
Total Bilirubin: 0.3 mg/dL (ref 0.3–1.2)
Total Protein: 7.6 g/dL (ref 6.5–8.1)

## 2015-10-03 LAB — LIPASE, BLOOD: LIPASE: 21 U/L (ref 11–51)

## 2015-10-03 NOTE — ED Provider Notes (Signed)
[facility] Provider Note   CSN: [id number] Arrival date & time: 10/03/15  1645     History   Chief Complaint Chief Complaint  Patient presents with  . Abdominal Pain    HPI Mekenna Fruge is a 60 y.o. female with history of chronic abdominal pain who presents with acute on chronic right lower quadrant. Patient has been experiencing pain for the past week. Patient last experience the pain in May. Patient has no pain at rest the patient is only present when walking. Patient denies any urinary symptoms, vaginal discharge, any concern for STD exposure. Patient denies any nausea, vomiting, diarrhea. Patient states that she had one day last week where she had more bowel movements than usual, however they were not abnormal. Patient takes Tylenol for your knees and found some relief from her abdominal pain as well. Patient has had this pain, and go for the past 1-2 years. Patient has had a CT scan in the past and a pelvic ultrasound 3 months ago which did not show any etiology. Patient also followed up with OB/GYN without finding an answer. Patient has been told by her primary care provider that it may be musculoskeletal.  Patient denies any chest pain, shortness of breath. Patient denies any heavy lifting or injury.  HPI  Past Medical History:  Diagnosis Date  . Anemia    due to menorrhagia, BL 8-10  . Anxiety   . Bilateral renal cysts 01/15/2009   Qualifier: Diagnosis of  By: Sunnie Nielsen MD, [place]    . Chronic abdominal pain   . Chronic back pain   . CKD (chronic kidney disease) stage 3, GFR 30-59 ml/min    baseline creatinine 1.4-1.7  . Colitis   . Congenital heart defect    surgically corrected as a child  . DDD (degenerative disc disease), lumbar   . Depression   . Diabetes mellitus type II, uncontrolled ([facility])   . Diabetes mellitus without complication ([facility])   . History of palpitations    evaluated recently 12'15  . Hyperlipidemia   . Hypertension   . IBS (irritable bowel  syndrome)   . Ovarian cyst, left   . Postmenopausal bleeding 06/12/2008  . UTI (lower urinary tract infection)   . Vaginal cyst    nabothian and bartholin    Patient Active Problem List   Diagnosis Date Noted  . Osteoarthritis of left knee 06/29/2015  . Bicornuate uterus 06/22/2015  . RLQ abdominal pain 06/12/2015  . Hyperkalemia 04/27/2015  . Recurrent UTI (urinary tract infection) 12/24/2014  . Hyponatremia 09/12/2014  . GERD (gastroesophageal reflux disease) 01/06/2014  . Urge incontinence 11/26/2013  . Chronic pain 09/13/2012  . Preventative health care 11/03/2010  . Bilateral renal cysts 01/15/2009  . Anxiety state 06/12/2008  . CKD (chronic kidney disease) stage 3, GFR 30-59 ml/min 06/12/2008  . Diabetes mellitus with stage 3 chronic kidney disease ([facility]) 12/28/2005  . Hyperlipidemia associated with type 2 diabetes mellitus ([facility]) 12/28/2005  . Hypertension associated with diabetes ([facility]) 12/28/2005    Past Surgical History:  Procedure Laterality Date  . CARDIAC SURGERY     to repair congenital defect as a child- 20months old  . COLONOSCOPY WITH PROPOFOL N/A 10/01/2014   Procedure: COLONOSCOPY WITH PROPOFOL;  Surgeon: Willis Modena, MD;  Location: [facility];  Service: Endoscopy;  Laterality: N/A;  . ESOPHAGOGASTRODUODENOSCOPY (EGD) WITH PROPOFOL N/A 10/01/2014   Procedure: ESOPHAGOGASTRODUODENOSCOPY (EGD) WITH PROPOFOL;  Surgeon: Willis Modena, MD;  Location: [facility];  Service: Endoscopy;  Laterality: N/A;  OB History    Gravida Para Term Preterm AB Living   3 1 1   2 1    SAB TAB Ectopic Multiple Live Births   2       1       Home Medications    Prior to Admission medications   Medication Sig Start Date End Date Taking? Authorizing Provider  acetaminophen (TYLENOL) 650 MG CR tablet Take 1,300 mg by mouth 2 (two) times daily.     Historical Provider, MD  cephALEXin (KEFLEX) 500 MG capsule Take 1 capsule (500 mg total) by mouth 2 (two) times daily. 09/20/15    [name], MD  diclofenac sodium (VOLTAREN) 1 % GEL Apply 4 g topically 4 (four) times daily. 06/29/15   Liberty Handy, MD  enalapril (VASOTEC) 10 MG tablet Take 1 tablet (10 mg total) by mouth daily. 09/11/15   Norman Herrlich, MD  gabapentin (NEURONTIN) 300 MG capsule Take 300 mg by mouth 2 (two) times daily.    Historical Provider, MD  Insulin Glargine (LANTUS SOLOSTAR) 100 UNIT/ML Solostar Pen Inject 20 Units into the skin daily at 10 pm. 03/24/15   Juliet Rude, MD  Insulin Pen Needle (PEN NEEDLES) 31G X 5 MM MISC 1 Syringe by Does not apply route daily at 10 pm. Use to inject insulin into the skin daily at 10 pm. diag code E11.22. Insulin dependent 08/06/15   Juliet Rude, MD    Family History Family History  Problem Relation Age of Onset  . Stroke Father   . Heart attack Father     Had MI in his 29s  . Stomach cancer Paternal Grandmother   . Diabetes Maternal Grandmother   . Cerebral palsy Daughter   . Anesthesia problems Neg Hx   . Hypotension Neg Hx   . Malignant hyperthermia Neg Hx   . Pseudochol deficiency Neg Hx     Social History Social History  Substance Use Topics  . Smoking status: Never Smoker  . Smokeless tobacco: Never Used  . Alcohol use No     Allergies   Review of patient's allergies indicates no known allergies.   Review of Systems Review of Systems  Constitutional: Negative for chills and fever.  HENT: Negative for facial swelling and sore throat.   Respiratory: Negative for shortness of breath.   Cardiovascular: Negative for chest pain.  Gastrointestinal: Positive for abdominal pain. Negative for constipation, diarrhea, nausea and vomiting.  Genitourinary: Negative for dysuria.  Musculoskeletal: Positive for back pain (chronic).  Skin: Negative for rash and wound.  Neurological: Negative for headaches.  Psychiatric/Behavioral: The patient is not nervous/anxious.      Physical Exam Updated Vital Signs BP 134/78 (BP Location: Left Arm)    Pulse 85   Temp 98 F (36.7 C) (Oral)   Resp 16   SpO2 98%   Physical Exam  Constitutional: She appears well-developed and well-nourished. No distress.  HENT:  Head: Normocephalic and atraumatic.  Mouth/Throat: Oropharynx is clear and moist. No oropharyngeal exudate.  Eyes: Conjunctivae are normal. Pupils are equal, round, and reactive to light. Right eye exhibits no discharge. Left eye exhibits no discharge. No scleral icterus.  Neck: Normal range of motion. Neck supple. No thyromegaly present.  Cardiovascular: Normal rate, regular rhythm and normal heart sounds.  Exam reveals no gallop and no friction rub.   No murmur heard. Pulmonary/Chest: Effort normal and breath sounds normal. No stridor. No respiratory distress. She has no wheezes. She has no rales.  Abdominal: Soft. Bowel sounds are normal. She exhibits no distension. There is no tenderness. There is no rebound and no guarding.  Abdominal exam benign; no tenderness on palpation  Musculoskeletal: She exhibits no edema.  Lymphadenopathy:    She has no cervical adenopathy.  Neurological: She is alert. Coordination normal.  Skin: Skin is warm and dry. No rash noted. She is not diaphoretic. No pallor.  Psychiatric: She has a normal mood and affect.  Nursing note and vitals reviewed.    ED Treatments / Results  Labs (all labs ordered are listed, but only abnormal results are displayed) Labs Reviewed  COMPREHENSIVE METABOLIC PANEL - Abnormal; Notable for the following:       Result Value   Sodium 133 (*)    Chloride 96 (*)    Glucose, Bld 323 (*)    BUN 43 (*)    Creatinine, Ser 1.60 (*)    GFR calc non Af Amer 34 (*)    GFR calc Af Amer 40 (*)    All other components within normal limits  URINALYSIS, ROUTINE W REFLEX MICROSCOPIC (NOT AT [facility]) - Abnormal; Notable for the following:    Glucose, UA 100 (*)    Leukocytes, UA SMALL (*)    All other components within normal limits  URINE MICROSCOPIC-ADD ON - Abnormal;  Notable for the following:    Squamous Epithelial / LPF 0-5 (*)    Bacteria, UA RARE (*)    All other components within normal limits  URINE CULTURE  LIPASE, BLOOD  CBC    EKG  EKG Interpretation None       Radiology No results found.  Procedures Procedures (including critical care time)  Medications Ordered in ED Medications - No data to display   Initial Impression / Assessment and Plan / ED Course  I have reviewed the triage vital signs and the nursing notes.  Pertinent labs & imaging results that were available during my care of the patient were reviewed by me and considered in my medical decision making (see chart for details).  Clinical Course    Due to patient's multiple imaging, no change in symptoms, benign abdominal exam, and stable labs, myself and Dr. Alvino Chapel agree that imaging is not indicated today. CBC unremarkable. CMP shows sodium 133, chloride 96, glucose 324, BUN 43, creatinine 1.60 (stable from past). Lipase 21. UA shows small leukocytes, rare bacteria. Urine culture sent. Patient without urinary symptoms, but would treat if urine culture is positive. Patient recently finished antibiotic course for UTI. Patient follow-up with primary care provider this week and gastroenterology for further evaluation of patient's acute on chronic pain. Return precautions discussed. Patient understands and agrees with plan. I discussed patient with Dr. Alvino Chapel who guided the patient's management and agrees with plan. Patient vitals stable throughout ED course and discharged in satisfactory condition.  Final Clinical Impressions(s) / ED Diagnoses   Final diagnoses:  Right lower quadrant abdominal pain    New Prescriptions New Prescriptions   No medications on file     Caryl Ada 10/03/15 2130    Davonna Belling, MD 10/03/15 [phone]

## 2015-10-03 NOTE — ED Notes (Signed)
Pt. Unable to provide urine sample at this time. 

## 2015-10-03 NOTE — Discharge Instructions (Signed)
Treatment: Take Tylenol as prescribed as needed for your pain. A urine culture was sent. You will be called in  2-3 days if any infection grows and requires antibiotics.  Follow-up: Please follow-up with your primary care provider as soon as possible for further evaluation and treatment of your pain. If you are unhappy with your primary care provider, you can call the number circled on your discharge paperwork to establish care with a new primary care provider. Please follow-up with Dr. Collene Mares or your own gastroenterologist for further evaluation and treatment of your pain. Please return to emergency department if you develop any new or worsening symptoms.

## 2015-10-03 NOTE — ED Triage Notes (Signed)
Pt c/o RLQ abdominal pain x 1 week. Pt sts she has had this same pain in the past and no one has ever been able to tell her what it is. Pt denies N/V/D. Pt denies urinary symptoms. Pt A&Ox4 and ambulatory. Pt sts pain is worse with walking.

## 2015-10-05 LAB — URINE CULTURE: Special Requests: NORMAL

## 2015-10-09 ENCOUNTER — Ambulatory Visit (HOSPITAL_COMMUNITY)
Admission: EM | Admit: 2015-10-09 | Discharge: 2015-10-09 | Disposition: A | Discharge status: Home / Self Care | Payer: Medicaid Other | Attending: Physician Assistant | Admitting: Physician Assistant

## 2015-10-09 ENCOUNTER — Encounter (HOSPITAL_COMMUNITY): Payer: Self-pay | Admitting: Family Medicine

## 2015-10-09 DIAGNOSIS — R3589 Other polyuria: Secondary | ICD-10-CM

## 2015-10-09 DIAGNOSIS — R358 Other polyuria: Secondary | ICD-10-CM

## 2015-10-09 LAB — POCT URINALYSIS DIP (DEVICE)
Bilirubin Urine: NEGATIVE
Glucose, UA: >=1000 mg/dL — AB
Ketones, ur: NEGATIVE mg/dL
Nitrite: NEGATIVE
PH: 5.5 (ref 5.0–8.0)
Protein, ur: NEGATIVE mg/dL
SPECIFIC GRAVITY, URINE: 1.015 (ref 1.005–1.030)
UROBILINOGEN UA: 0.2 mg/dL (ref 0.0–1.0)

## 2015-10-09 NOTE — ED Triage Notes (Signed)
Pt here for intermittent right groin pain that has been going on for  A few weeks. Denies any N,V,D. Denies hematuria or dysuria.

## 2015-10-09 NOTE — Discharge Instructions (Signed)
Sorry you are frustrated. Please keep f/u with your primary care physician to help sort this out.

## 2015-10-09 NOTE — ED Provider Notes (Signed)
CSN: [id number]     Arrival date & time 10/09/15  1826 History   First MD Initiated Contact with Patient 10/09/15 1907     Chief Complaint  Patient presents with  . Groin Pain   (Consider location/radiation/quality/duration/timing/severity/associated sxs/prior Treatment) Patient is a 60 yo female who carries a history of chronic right groin/abdominal pain. Please refer to note 1 week ago. When presented to room patient got upset with me almost immediately. She told me she is here "only for my daughter" and she has been urinating a lot. She wants a urine check. She has not followed up with anyone for her chronic pain and does not wish to discuss it with me today.       Past Medical History:  Diagnosis Date  . Anemia    due to menorrhagia, BL 8-10  . Anxiety   . Bilateral renal cysts 01/15/2009   Qualifier: Diagnosis of  By: Tyrell Antonio MD, Belkys    . Chronic abdominal pain   . Chronic back pain   . CKD (chronic kidney disease) stage 3, GFR 30-59 ml/min    baseline creatinine 1.4-1.7  . Colitis   . Congenital heart defect    surgically corrected as a child  . DDD (degenerative disc disease), lumbar   . Depression   . Diabetes mellitus type II, uncontrolled ([place])   . Diabetes mellitus without complication ([place])   . History of palpitations    evaluated recently 12'15  . Hyperlipidemia   . Hypertension   . IBS (irritable bowel syndrome)   . Ovarian cyst, left   . Postmenopausal bleeding 06/12/2008  . UTI (lower urinary tract infection)   . Vaginal cyst    nabothian and bartholin   Past Surgical History:  Procedure Laterality Date  . CARDIAC SURGERY     to repair congenital defect as a child- 36months old  . COLONOSCOPY WITH PROPOFOL N/A 10/01/2014   Procedure: COLONOSCOPY WITH PROPOFOL;  Surgeon: Arta Silence, MD;  Location: [facility];  Service: Endoscopy;  Laterality: N/A;  . ESOPHAGOGASTRODUODENOSCOPY (EGD) WITH PROPOFOL N/A 10/01/2014   Procedure:  ESOPHAGOGASTRODUODENOSCOPY (EGD) WITH PROPOFOL;  Surgeon: Arta Silence, MD;  Location: [facility];  Service: Endoscopy;  Laterality: N/A;   Family History  Problem Relation Age of Onset  . Stroke Father   . Heart attack Father     Had MI in his 69s  . Stomach cancer Paternal Grandmother   . Diabetes Maternal Grandmother   . Cerebral palsy Daughter   . Anesthesia problems Neg Hx   . Hypotension Neg Hx   . Malignant hyperthermia Neg Hx   . Pseudochol deficiency Neg Hx    Social History  Substance Use Topics  . Smoking status: Never Smoker  . Smokeless tobacco: Never Used  . Alcohol use No   OB History    Gravida Para Term Preterm AB Living   3 1 1   2 1    SAB TAB Ectopic Multiple Live Births   2       1     Review of Systems  All other systems reviewed and are negative.   Allergies  Review of patient's allergies indicates no known allergies.  Home Medications   Prior to Admission medications   Medication Sig Start Date End Date Taking? Authorizing Provider  acetaminophen (TYLENOL) 650 MG CR tablet Take 1,300 mg by mouth 2 (two) times daily.     Historical Provider, MD  cephALEXin (KEFLEX) 500 MG capsule Take 1 capsule (500  mg total) by mouth 2 (two) times daily. 09/20/15   Maria Pour, MD  diclofenac sodium (VOLTAREN) 1 % GEL Apply 4 g topically 4 (four) times daily. 06/29/15   Liberty Handy, MD  enalapril (VASOTEC) 10 MG tablet Take 1 tablet (10 mg total) by mouth daily. 09/11/15   Norman Herrlich, MD  gabapentin (NEURONTIN) 300 MG capsule Take 300 mg by mouth 2 (two) times daily.    Historical Provider, MD  Insulin Glargine (LANTUS SOLOSTAR) 100 UNIT/ML Solostar Pen Inject 20 Units into the skin daily at 10 pm. 03/24/15   Juliet Rude, MD  Insulin Pen Needle (PEN NEEDLES) 31G X 5 MM MISC 1 Syringe by Does not apply route daily at 10 pm. Use to inject insulin into the skin daily at 10 pm. diag code E11.22. Insulin dependent 08/06/15   Juliet Rude, MD   Meds Ordered and  Administered this Visit  Medications - No data to display  BP 160/87 (BP Location: Left Arm)   Pulse 92   Temp 98.3 F (36.8 C) (Oral)   Resp 20   SpO2 99%  No data found.   Physical Exam  Constitutional: She appears well-developed and well-nourished.  Patient frustrated and therefore did not examine, as she "just want a urine"  Psychiatric:  Patient agitated, but in no acute distress.   Nursing note and vitals reviewed.   Urgent Care Course   Clinical Course    Procedures (including critical care time)  Labs Review Labs Reviewed  POCT URINALYSIS DIP (DEVICE) - Abnormal; Notable for the following:       Result Value   Glucose, UA >=1000 (*)    Hgb urine dipstick TRACE (*)    Leukocytes, UA TRACE (*)    All other components within normal limits    Imaging Review No results found.   Visual Acuity Review  Right Eye Distance:   Left Eye Distance:   Bilateral Distance:    Right Eye Near:   Left Eye Near:    Bilateral Near:         MDM   1. Polyuria    Urine shows trace leuks which is same result on 08/27 with a negative urine culture. She also had high glucose levels.  I offered to treat her symptoms with a antibiotic for UTI, but patient refused and just wanted to leave. She did not want a blood glucose.  Last one was 323 1 week ago, patient takes insulin for Type 2 DM.    Maria Pippin, PA-C 10/09/15 2008

## 2015-10-19 ENCOUNTER — Ambulatory Visit (INDEPENDENT_AMBULATORY_CARE_PROVIDER_SITE_OTHER): Payer: Medicaid Other | Admitting: Internal Medicine

## 2015-10-19 ENCOUNTER — Encounter: Payer: Self-pay | Admitting: Internal Medicine

## 2015-10-19 VITALS — BP 144/76 | HR 87 | Temp 98.2°F | Ht 64.0 in | Wt 185.1 lb

## 2015-10-19 DIAGNOSIS — Z794 Long term (current) use of insulin: Secondary | ICD-10-CM

## 2015-10-19 DIAGNOSIS — M1712 Unilateral primary osteoarthritis, left knee: Secondary | ICD-10-CM

## 2015-10-19 DIAGNOSIS — I152 Hypertension secondary to endocrine disorders: Secondary | ICD-10-CM | POA: Diagnosis not present

## 2015-10-19 DIAGNOSIS — N183 Chronic kidney disease, stage 3 unspecified: Secondary | ICD-10-CM

## 2015-10-19 DIAGNOSIS — E1159 Type 2 diabetes mellitus with other circulatory complications: Secondary | ICD-10-CM

## 2015-10-19 DIAGNOSIS — Z23 Encounter for immunization: Secondary | ICD-10-CM | POA: Diagnosis not present

## 2015-10-19 DIAGNOSIS — Z79899 Other long term (current) drug therapy: Secondary | ICD-10-CM | POA: Diagnosis not present

## 2015-10-19 DIAGNOSIS — Z0000 Encounter for general adult medical examination without abnormal findings: Secondary | ICD-10-CM

## 2015-10-19 DIAGNOSIS — E1122 Type 2 diabetes mellitus with diabetic chronic kidney disease: Secondary | ICD-10-CM | POA: Diagnosis not present

## 2015-10-19 DIAGNOSIS — E1165 Type 2 diabetes mellitus with hyperglycemia: Secondary | ICD-10-CM

## 2015-10-19 DIAGNOSIS — R635 Abnormal weight gain: Secondary | ICD-10-CM | POA: Insufficient documentation

## 2015-10-19 DIAGNOSIS — I10 Essential (primary) hypertension: Secondary | ICD-10-CM

## 2015-10-19 LAB — GLUCOSE, CAPILLARY: GLUCOSE-CAPILLARY: 319 mg/dL — AB (ref 65–99)

## 2015-10-19 LAB — POCT GLYCOSYLATED HEMOGLOBIN (HGB A1C): Hemoglobin A1C: 9.3

## 2015-10-19 MED ORDER — ENALAPRIL MALEATE 10 MG PO TABS: 10.0000 mg | ORAL_TABLET | Freq: Every day | ORAL | 2 refills | Status: AC

## 2015-10-19 MED ORDER — DULOXETINE HCL 30 MG PO CPEP: 30.0000 mg | ORAL_CAPSULE | Freq: Every day | ORAL | 2 refills | Status: DC

## 2015-10-19 NOTE — Progress Notes (Signed)
   CC: Multiple chronic complaints   HPI:  [name] is a 60 y.o. female with PMHx detailed below presenting with multiple vague chronic complaints, no clear CC. Mentions: left knee pain (OA), right arm pain (radiculopathy), weight gain, intermittent pelvic pain, and urinary discomfort at night.   See problem based assessment and plan below for additional details.  Past Medical History:  Diagnosis Date  . Anemia    due to menorrhagia, BL 8-10  . Anxiety   . Bilateral renal cysts 01/15/2009   Qualifier: Diagnosis of  By: Tyrell Antonio MD, Belkys    . Chronic abdominal pain   . Chronic back pain   . CKD (chronic kidney disease) stage 3, GFR 30-59 ml/min    baseline creatinine 1.4-1.7  . Colitis   . Congenital heart defect    surgically corrected as a child  . DDD (degenerative disc disease), lumbar   . Depression   . Diabetes mellitus type II, uncontrolled ([place])   . Diabetes mellitus without complication ([place])   . History of palpitations    evaluated recently 12'15  . Hyperlipidemia   . Hypertension   . IBS (irritable bowel syndrome)   . Ovarian cyst, left   . Postmenopausal bleeding 06/12/2008  . UTI (lower urinary tract infection)   . Vaginal cyst    nabothian and bartholin    Review of Systems: Review of Systems  Constitutional: Positive for malaise/fatigue. Negative for chills and fever.  Respiratory: Negative for cough.   Cardiovascular: Positive for leg swelling. Negative for chest pain and orthopnea.  Gastrointestinal: Positive for abdominal pain. Negative for blood in stool, constipation and diarrhea.  Genitourinary: Positive for frequency. Negative for dysuria, flank pain, hematuria and urgency.       Right sided pelvic pain  Musculoskeletal: Positive for back pain, joint pain, myalgias and neck pain.  Neurological: Negative for headaches.     Physical Exam: Vitals:   10/19/15 1546  BP: (!) 144/76  Pulse: 87  Temp: 98.2 F (36.8 C)  TempSrc: Oral    SpO2: 98%  Weight: 185 lb 1.6 oz (84 kg)  Height: 5\' 4"  (1.626 m)   GENERAL- Woman sitting comfortably in exam room chair, alert, in no distress, appears older than stated age HEENT- Atraumatic, PERRL, EOMI, moist mucous membranes CARDIAC- Regular rate and rhythm, no murmurs, rubs or gallops. RESP- Clear to ascultation bilaterally, no wheezing or crackles, normal work of breathing ABDOMEN- Normoactive bowel sounds, soft, nontender, nondistended BACK- Normal curvature, no spinal tenderness, no CVA tenderness. NEURO- Alert and oriented, cranial nerves grossly intact EXTREMITIES- Normal bulk and range of motion, no edema, 2+ peripheral pulses SKIN- Warm, dry, intact, without visible rash PSYCH- Guarded affect, fast but clear speech, tangential thoughts and conversation  Assessment & Plan:   See encounters tab for problem based medical decision making.  Patient seen with Dr. Daryll Drown

## 2015-10-19 NOTE — Assessment & Plan Note (Addendum)
POCT HbA1c 9.3 today, from 9.6 in May 2017. Ongoing poor control of T2DM and weight gain. Takes Lantus 20 U nightly, states that AM sugars tends to run around 100 and that her number run "much higher" in the afternoons. Suggests that she may require Lantus in the AM and PM. Has appointment with PCP tomorrow and very interested in getting her diabetes under control.   Plan: - Defer to PCP appt tomorrow - Will check CMP, TSH

## 2015-10-19 NOTE — Assessment & Plan Note (Signed)
Provided refill for Enalapril

## 2015-10-19 NOTE — Patient Instructions (Addendum)
Thank you for visiting our clinic today!  For your osteoarthritis pain we have prescribed low-dose Duloxetine (Cymbalta) - 30 mg daily. You can continue to take tylenol as well for pain. Please return to our clinic tomorrow for your appointment with Dr. Arcelia Jew to manage your chronic conditions such as diabetes, hypertension, and kidney disease. We have checked some blood work today and results should be available tomorrow.

## 2015-10-19 NOTE — Assessment & Plan Note (Signed)
Complains of ongoing pain from "bone-on-bone arthritis" of her left knee. Takes tylenol in the morning and evening with some efficacy. Also endorses numerous other musculoskeletal complaints, specifically pain in her fingers that feels like arthritis as well as shooting pains down her right arm that she says is from a pinched nerve. Some psychologic component to her pain, has history of both anxiety and depression.   Plan: - Cymbalta 30mg  daily for osteoarthritis, may also improve mood and anxiety - Continue Tylenol prn - Recommended staying active, exercise, and diet to help lose weight.

## 2015-10-19 NOTE — Assessment & Plan Note (Signed)
Provided flu vaccine 

## 2015-10-20 ENCOUNTER — Ambulatory Visit (INDEPENDENT_AMBULATORY_CARE_PROVIDER_SITE_OTHER): Payer: Medicaid Other | Admitting: Internal Medicine

## 2015-10-20 ENCOUNTER — Encounter: Payer: Self-pay | Admitting: Internal Medicine

## 2015-10-20 DIAGNOSIS — R1011 Right upper quadrant pain: Secondary | ICD-10-CM | POA: Diagnosis not present

## 2015-10-20 DIAGNOSIS — Z794 Long term (current) use of insulin: Secondary | ICD-10-CM

## 2015-10-20 DIAGNOSIS — Z79899 Other long term (current) drug therapy: Secondary | ICD-10-CM

## 2015-10-20 DIAGNOSIS — E1122 Type 2 diabetes mellitus with diabetic chronic kidney disease: Secondary | ICD-10-CM

## 2015-10-20 DIAGNOSIS — I152 Hypertension secondary to endocrine disorders: Secondary | ICD-10-CM

## 2015-10-20 DIAGNOSIS — N183 Chronic kidney disease, stage 3 unspecified: Secondary | ICD-10-CM

## 2015-10-20 DIAGNOSIS — R1031 Right lower quadrant pain: Secondary | ICD-10-CM

## 2015-10-20 DIAGNOSIS — E1159 Type 2 diabetes mellitus with other circulatory complications: Secondary | ICD-10-CM

## 2015-10-20 DIAGNOSIS — E1165 Type 2 diabetes mellitus with hyperglycemia: Secondary | ICD-10-CM

## 2015-10-20 DIAGNOSIS — I10 Essential (primary) hypertension: Secondary | ICD-10-CM

## 2015-10-20 LAB — CMP14 + ANION GAP
A/G RATIO: 1.7 (ref 1.2–2.2)
ALBUMIN: 4.1 g/dL (ref 3.5–5.5)
ALK PHOS: 135 IU/L — AB (ref 39–117)
ALT: 19 IU/L (ref 0–32)
ANION GAP: 18 mmol/L (ref 10.0–18.0)
AST: 20 IU/L (ref 0–40)
BUN / CREAT RATIO: 28 — AB (ref 9–23)
BUN: 35 mg/dL — ABNORMAL HIGH (ref 6–24)
Bilirubin Total: 0.2 mg/dL (ref 0.0–1.2)
CALCIUM: 9 mg/dL (ref 8.7–10.2)
CO2: 22 mmol/L (ref 18–29)
CREATININE: 1.26 mg/dL — AB (ref 0.57–1.00)
Chloride: 96 mmol/L (ref 96–106)
GFR calc Af Amer: 54 mL/min/{1.73_m2} — ABNORMAL LOW (ref >59)
GFR, EST NON AFRICAN AMERICAN: 47 mL/min/{1.73_m2} — AB (ref >59)
GLOBULIN, TOTAL: 2.4 g/dL (ref 1.5–4.5)
Glucose: 338 mg/dL — ABNORMAL HIGH (ref 65–99)
POTASSIUM: 5.5 mmol/L — AB (ref 3.5–5.2)
SODIUM: 136 mmol/L (ref 134–144)
Total Protein: 6.5 g/dL (ref 6.0–8.5)

## 2015-10-20 LAB — TSH: TSH: 2.19 u[IU]/mL (ref 0.450–4.500)

## 2015-10-20 LAB — GLUCOSE, CAPILLARY: Glucose-Capillary: 221 mg/dL — ABNORMAL HIGH (ref 65–99)

## 2015-10-20 MED ORDER — INSULIN GLARGINE 100 UNIT/ML SOLOSTAR PEN: PEN_INJECTOR | SUBCUTANEOUS | 11 refills | Status: DC

## 2015-10-20 NOTE — Patient Instructions (Signed)
General Instructions: - Change Lantus to 10 units in the morning and 10 units in the evening - Continue checking your blood sugar in the morning and at dinner time. You are doing a great job. - Try the Cymbalta. You must take this medication daily in order for it to work properly. This medication may help your pain.  Please bring your medicines with you each time you come to clinic.  Medicines may include prescription medications, over-the-counter medications, herbal remedies, eye drops, vitamins, or other pills.   Progress Toward Treatment Goals:  Treatment Goal 03/24/2015  Hemoglobin A1C unchanged  Blood pressure unchanged    Self Care Goals & Plans:  Self Care Goal 10/20/2015  Manage my medications take my medicines as prescribed; bring my medications to every visit; refill my medications on time  Monitor my health keep track of my blood glucose; keep track of my blood pressure; keep track of my weight  Eat healthy foods eat baked foods instead of fried foods; eat foods that are low in salt; drink diet soda or water instead of juice or soda; eat fruit for snacks and desserts  Be physically active find an activity I enjoy  Prevent falls -  Meeting treatment goals -    Home Blood Glucose Monitoring 03/24/2015  Check my blood sugar -  When to check my blood sugar before breakfast; before dinner     Care Management & Community Referrals:  Referral 06/17/2014  Referrals made for care management support financial counselor

## 2015-10-21 NOTE — Progress Notes (Signed)
   CC: Hypertension   HPI:  [name] is a 60 y.o. woman with PMHx as noted below who presents today for follow up of her hypertension.  HTN: BP mildly above goal at 143/80. She is taking Enalapril 10 mg daily.   Type 2 DM: Last A1c 9.3. She is taking Lantus 20 units at bedtime. Her AM blood sugars are in good range in the 100-160s, but evening blood sugars are elevated in the 200-300s mostly. She is motivated to get her blood sugars under better control.  RLQ Abdominal Pain: She complains of RLQ pain that has been ongoing for months. She describes the pain as stabbing, achy, and intermittent. She notes tylenol does not help the pain. Movement does not seem to affect the pain. She denies fever, chills, nausea, vomiting, bloating, diarrhea, or constipation.   Past Medical History:  Diagnosis Date  . Anemia    due to menorrhagia, BL 8-10  . Anxiety   . Bilateral renal cysts 01/15/2009   Qualifier: Diagnosis of  By: Tyrell Antonio MD, Belkys    . Chronic abdominal pain   . Chronic back pain   . CKD (chronic kidney disease) stage 3, GFR 30-59 ml/min    baseline creatinine 1.4-1.7  . Colitis   . Congenital heart defect    surgically corrected as a child  . DDD (degenerative disc disease), lumbar   . Depression   . Diabetes mellitus type II, uncontrolled ([place])   . Diabetes mellitus without complication ([place])   . History of palpitations    evaluated recently 12'15  . Hyperlipidemia   . Hypertension   . IBS (irritable bowel syndrome)   . Ovarian cyst, left   . Postmenopausal bleeding 06/12/2008  . UTI (lower urinary tract infection)   . Vaginal cyst    nabothian and bartholin    Review of Systems:  All negative except per HPI  Physical Exam:  Vitals:   10/20/15 1500  BP: (!) 143/80  Pulse: 86  Resp: 20  Temp: 98.2 F (36.8 C)  TempSrc: Oral  SpO2: 100%  Weight: 185 lb 1.6 oz (84 kg)   General: well-nourished, anxious-appearing woman, speech is pressured HEENT: [place]/AT,  EOMI, sclera anicteric, mucus membranes moist CV: RRR, 2/6 systolic murmur Pulm: CTA bilaterally, breaths non-labored Abd: BS+, soft, non-tender, non-distended, no guarding or rebound Ext: warm, no edema  Neuro: alert and oriented x 3  Assessment & Plan:   See Encounters Tab for problem based charting.  Patient discussed with Dr. Eppie Gibson

## 2015-10-21 NOTE — Assessment & Plan Note (Signed)
Her A1c has remained about the same over the last 6 months. Given her elevated blood sugars in the evening, will try splitting up her Lantus into morning and evening doses. Will try Lantus 10 units BID. She will likely require more Lantus for the morning dose. Advised her to continue checking in the morning and evening as she has been doing. Will have her follow up in a few weeks to reassess blood sugars and adjust insulin as needed.

## 2015-10-21 NOTE — Assessment & Plan Note (Signed)
Her RLQ pain has been ongoing for months now. She has had multiple imaging studies over the last year which have not showed any obvious etiologies to explain her pain. Additionally she has multiple other somatic complaints. I advised her to try taking the Cymbalta to see if this will help her pain while we try to figure out other causes for her pain. She seems hesitant to take Cymbalta because she sees this as a psych med only. She agreed to try taking it and I advised her that it will take some time for this medication to take effect. Will follow up her pain at her next visit with me.

## 2015-10-21 NOTE — Assessment & Plan Note (Signed)
BP acceptable. Patient refuses to go up on her Enalapril. Will continue with 10 mg daily.

## 2015-10-22 NOTE — Progress Notes (Signed)
Patient ID: [name], female   DOB: Mar 02, 1955, 60 y.o.   MRN: [id number]  Case discussed with Dr. Arcelia Jew soon after the resident saw the patient. We reviewed the resident's history and exam and pertinent patient test results. I agree with the assessment, diagnosis, and plan of care documented in the resident's note.

## 2015-10-27 NOTE — Progress Notes (Signed)
Internal Medicine Clinic Attending  I saw and evaluated the patient.  I personally confirmed the key portions of the history and exam documented by Dr. Johnson and I reviewed pertinent patient test results.  The assessment, diagnosis, and plan were formulated together and I agree with the documentation in the resident's note.  

## 2015-11-03 ENCOUNTER — Encounter: Payer: Self-pay | Admitting: Internal Medicine

## 2015-11-03 ENCOUNTER — Ambulatory Visit (INDEPENDENT_AMBULATORY_CARE_PROVIDER_SITE_OTHER): Payer: Medicaid Other | Admitting: Internal Medicine

## 2015-11-03 DIAGNOSIS — M25562 Pain in left knee: Secondary | ICD-10-CM | POA: Diagnosis not present

## 2015-11-03 DIAGNOSIS — Z794 Long term (current) use of insulin: Secondary | ICD-10-CM

## 2015-11-03 DIAGNOSIS — I152 Hypertension secondary to endocrine disorders: Secondary | ICD-10-CM

## 2015-11-03 DIAGNOSIS — E1122 Type 2 diabetes mellitus with diabetic chronic kidney disease: Secondary | ICD-10-CM

## 2015-11-03 DIAGNOSIS — E1159 Type 2 diabetes mellitus with other circulatory complications: Secondary | ICD-10-CM

## 2015-11-03 DIAGNOSIS — G8929 Other chronic pain: Secondary | ICD-10-CM

## 2015-11-03 DIAGNOSIS — N183 Chronic kidney disease, stage 3 unspecified: Secondary | ICD-10-CM

## 2015-11-03 DIAGNOSIS — I10 Essential (primary) hypertension: Secondary | ICD-10-CM

## 2015-11-03 DIAGNOSIS — E1165 Type 2 diabetes mellitus with hyperglycemia: Secondary | ICD-10-CM | POA: Diagnosis present

## 2015-11-03 DIAGNOSIS — Z79899 Other long term (current) drug therapy: Secondary | ICD-10-CM | POA: Diagnosis not present

## 2015-11-03 DIAGNOSIS — F419 Anxiety disorder, unspecified: Secondary | ICD-10-CM | POA: Diagnosis not present

## 2015-11-03 MED ORDER — INSULIN GLARGINE 100 UNIT/ML SOLOSTAR PEN: PEN_INJECTOR | SUBCUTANEOUS | 11 refills | Status: AC

## 2015-11-03 NOTE — Assessment & Plan Note (Signed)
She was switched to Lantus 10 units twice daily during her last follow-up instead of 20 units at bedtime. As her blood sugar was high in the evening. She did brought her glucometer meter today which shows high blood sugar both in the morning and evening. Evening is more than the morning one. Morning was little above 200 and evening was in 400s. She states that it is inconvenient to take insulin twice daily, but after talking with her she agreed to continue this regimen. I increased her Lantus to 12 units twice daily. Follow-up with the next 4 weeks with PCP for further management.

## 2015-11-03 NOTE — Assessment & Plan Note (Signed)
She is on Neurontin 300 mg twice daily. She was prescribed Cymbalta 30 mg to address her chronic pain and anxiety. She states that she never took Cymbalta, and she do not want to add another medicine. She do not want to go higher on Neurontin either at this time.  Reassess during next follow-up visit.

## 2015-11-03 NOTE — Assessment & Plan Note (Signed)
BP Readings from Last 3 Encounters:  11/03/15 (!) 165/98  10/20/15 (!) 143/80  10/19/15 (!) 144/76   Her blood pressure was high today. She is on enalapril 10 mg. She was advised in the past to increase the dose, she refused any changes in her blood pressure medicine.

## 2015-11-03 NOTE — Progress Notes (Signed)
   CC: For follow up of her diabetes and hypertension.  HPI:  [name] is a 60 y.o. with past medical history as listed below, came to the clinic for follow-up of her diabetes and hypertension. She has uncontrolled diabetes with recent A1c was 9.3. As she was having higher blood sugar mostly in the evening and she was on Lantus 20 units at bedtime. Dr. Arcelia Jew make it 10 unit twice daily. She states that she is taking it twice although that is inconvenient for her. She is having high blood pressure during her previous follow-up visits too, she was advised to increase the dose of her enalapril but she refused that. She was prescribed Cymbalta 30 mg daily for her chronic pain and anxiety, she states that she never took it and do not want to take it either. She complained of left knee pain, she is being followed up at orthopedic surgeon's office, and was given a steroid injection last week. She states that steroid injections never helped.  Past Medical History:  Diagnosis Date  . Anemia    due to menorrhagia, BL 8-10  . Anxiety   . Bilateral renal cysts 01/15/2009   Qualifier: Diagnosis of  By: Tyrell Antonio MD, Belkys    . Chronic abdominal pain   . Chronic back pain   . CKD (chronic kidney disease) stage 3, GFR 30-59 ml/min    baseline creatinine 1.4-1.7  . Colitis   . Congenital heart defect    surgically corrected as a child  . DDD (degenerative disc disease), lumbar   . Depression   . Diabetes mellitus type II, uncontrolled ([place])   . Diabetes mellitus without complication ([place])   . History of palpitations    evaluated recently 12'15  . Hyperlipidemia   . Hypertension   . IBS (irritable bowel syndrome)   . Ovarian cyst, left   . Postmenopausal bleeding 06/12/2008  . UTI (lower urinary tract infection)   . Vaginal cyst    nabothian and bartholin    Review of Systems:  As per HPI.  Physical Exam:  Vitals:   11/03/15 1419  BP: (!) 165/98  Pulse: 84  Temp: 98.1 F (36.7  C)  TempSrc: Oral  SpO2: 99%  Weight: 180 lb 9.6 oz (81.9 kg)  Height: 5\' 4"  (1.626 m)   Gen. Well-nourished, well-developed anxious looking lady, in no acute distress Lungs. Clear bilaterally. CV. Regular rate and rhythm. Abdomen. Soft, nontender, bowel sounds positive Extremities. No edema, no cyanosis.  Assessment & Plan:   See Encounters Tab for problem based charting.  Patient seen with Dr. Daryll Drown.

## 2015-11-03 NOTE — Patient Instructions (Signed)
Thank you for this sting clinic today. Your blood sugar is still uncontrolled, I'm increasing your Lantus to 12 units in the morning and 12 at night. Please check your blood sugar twice daily and bring your glucose meter during your next appointment. Your blood pressure is still high, it was high during last few follow-ups. Increased in your enalapril as needed, as she do not want any changes at this time, I will keep the same dose. Please check your blood pressure at home and keep a log. We will reassess during next follow-up visit. You can follow-up with your PCP in about 4 weeks.

## 2015-11-04 ENCOUNTER — Other Ambulatory Visit: Payer: Self-pay | Admitting: Dietician

## 2015-11-04 DIAGNOSIS — E1122 Type 2 diabetes mellitus with diabetic chronic kidney disease: Secondary | ICD-10-CM

## 2015-11-04 DIAGNOSIS — N183 Chronic kidney disease, stage 3 (moderate): Principal | ICD-10-CM

## 2015-11-04 MED ORDER — ACCU-CHEK AVIVA PLUS W/DEVICE KIT: PACK | 0 refills | Status: DC

## 2015-11-04 MED ORDER — GLUCOSE BLOOD VI STRP: ORAL_STRIP | 12 refills | Status: DC

## 2015-11-04 MED ORDER — ACCU-CHEK FASTCLIX LANCETS MISC: 12 refills | Status: DC

## 2015-11-04 NOTE — Telephone Encounter (Signed)
Wants a new meter, she is not sure about the accuracy of the one she is using. ( has a wave sense presto that is supposed to meet the latest ISO standards for glucose meters)  She now has Medicaid. Her blood sugar is high fasting 273 today and she has not eaten anything different, did not sleep too well.  She verbalized that her insulin had been changed to twice a day rather than one injection a day and she knows she has to give that some time to work.   She plans ot check her blood sugar twice a day.

## 2015-11-08 ENCOUNTER — Encounter (HOSPITAL_COMMUNITY): Payer: Self-pay | Admitting: *Deleted

## 2015-11-08 ENCOUNTER — Ambulatory Visit (HOSPITAL_COMMUNITY)
Admission: EM | Admit: 2015-11-08 | Discharge: 2015-11-08 | Disposition: A | Discharge status: Home / Self Care | Payer: Medicaid Other | Attending: Family Medicine | Admitting: Family Medicine

## 2015-11-08 DIAGNOSIS — N390 Urinary tract infection, site not specified: Secondary | ICD-10-CM

## 2015-11-08 MED ORDER — CIPROFLOXACIN HCL 500 MG PO TABS: 500.0000 mg | ORAL_TABLET | Freq: Two times a day (BID) | ORAL | 0 refills | Status: DC

## 2015-11-08 NOTE — ED Triage Notes (Signed)
C/O RLQ intermittently x "months".  Pain worse at night; denies at present.

## 2015-11-08 NOTE — ED Provider Notes (Signed)
CSN: [id number]     Arrival date & time 11/08/15  1619 History   None    Chief Complaint  Patient presents with  . Abdominal Pain   (Consider location/radiation/quality/duration/timing/severity/associated sxs/prior Treatment) 60 y.o. female presents with right lower quadrant pain intermittently X several months. Condition is chronic in nature. Condition is made better by rest. Condition is made worse by movement. Patient denies any relief from tylenol prior to there arrival at this facility. Patient denies any urinary symptoms, nausea, vomiting, diarrhea. Patient requested a test to rule out UTI as she recently was diagnosed with one and completed the medication. Patient denies that her issues are musculoskeletal in nature and denies treatment for such.        Past Medical History:  Diagnosis Date  . Anemia    due to menorrhagia, BL 8-10  . Anxiety   . Bilateral renal cysts 01/15/2009   Qualifier: Diagnosis of  By: Regalado MD, Belkys    . Chronic abdominal pain   . Chronic back pain   . CKD (chronic kidney disease) stage 3, GFR 30-59 ml/min    baseline creatinine 1.4-1.7  . Colitis   . Congenital heart defect    surgically corrected as a child  . DDD (degenerative disc disease), lumbar   . Depression   . Diabetes mellitus type II, uncontrolled (HCC)   . Diabetes mellitus without complication (HCC)   . History of palpitations    evaluated recently 12'15  . Hyperlipidemia   . Hypertension   . IBS (irritable bowel syndrome)   . Ovarian cyst, left   . Postmenopausal bleeding 06/12/2008  . UTI (lower urinary tract infection)   . Vaginal cyst    nabothian and bartholin   Past Surgical History:  Procedure Laterality Date  . CARDIAC SURGERY     to repair congenital defect as a child- 3months old  . COLONOSCOPY WITH PROPOFOL N/A 10/01/2014   Procedure: COLONOSCOPY WITH PROPOFOL;  Surgeon: William Outlaw, MD;  Location: [facility];  Service: Endoscopy;  Laterality: N/A;  .  ESOPHAGOGASTRODUODENOSCOPY (EGD) WITH PROPOFOL N/A 10/01/2014   Procedure: ESOPHAGOGASTRODUODENOSCOPY (EGD) WITH PROPOFOL;  Surgeon: William Outlaw, MD;  Location: [facility];  Service: Endoscopy;  Laterality: N/A;   Family History  Problem Relation Age of Onset  . Stroke Father   . Heart attack Father     Had MI in his 60s  . Stomach cancer Paternal Grandmother   . Diabetes Maternal Grandmother   . Cerebral palsy Daughter   . Anesthesia problems Neg Hx   . Hypotension Neg Hx   . Malignant hyperthermia Neg Hx   . Pseudochol deficiency Neg Hx    Social History  Substance Use Topics  . Smoking status: Never Smoker  . Smokeless tobacco: Never Used  . Alcohol use No   OB History    Gravida Para Term Preterm AB Living   3 1 1   2 1   SAB TAB Ectopic Multiple Live Births   2       1     Review of Systems  Constitutional: Negative.   Respiratory: Negative.   Cardiovascular: Negative.   Gastrointestinal: Positive for abdominal pain.       Right lower quadrant pain.   Musculoskeletal:       Right hip pain     Allergies  Review of patient's allergies indicates no known allergies.  Home Medications   Prior to Admission medications   Medication Sig Start Date End Date Taking?   Authorizing Provider  ACCU-CHEK FASTCLIX LANCETS MISC Check blood sugar two times a day as instructed 11/04/15  Yes Carly J Rivet, MD  acetaminophen (TYLENOL) 650 MG CR tablet Take 1,300 mg by mouth 2 (two) times daily.    Yes Historical Provider, MD  Blood Glucose Monitoring Suppl (ACCU-CHEK AVIVA PLUS) w/Device KIT Check blood sugar two times a day 11/04/15  Yes Carly J Rivet, MD  cephALEXin (KEFLEX) 500 MG capsule Take 1 capsule (500 mg total) by mouth 2 (two) times daily. 09/20/15  Yes Ryan B Grunz, MD  enalapril (VASOTEC) 10 MG tablet Take 1 tablet (10 mg total) by mouth daily. 10/19/15  Yes Adam Johnson, MD  gabapentin (NEURONTIN) 300 MG capsule Take 300 mg by mouth 2 (two) times daily.   Yes Historical  Provider, MD  glucose blood (ACCU-CHEK AVIVA PLUS) test strip Check blood sugar two times a day as instructed 11/04/15  Yes Carly J Rivet, MD  Insulin Glargine (LANTUS SOLOSTAR) 100 UNIT/ML Solostar Pen Take 12 units in the morning and 12 units in the evening. 11/03/15  Yes Sumayya Amin, MD  Insulin Pen Needle (PEN NEEDLES) 31G X 5 MM MISC 1 Syringe by Does not apply route daily at 10 pm. Use to inject insulin into the skin daily at 10 pm. diag code E11.22. Insulin dependent 08/06/15  Yes Carly J Rivet, MD  ciprofloxacin (CIPRO) 500 MG tablet Take 1 tablet (500 mg total) by mouth every 12 (twelve) hours. 11/08/15   Jennifer C Omohundro, NP  DULoxetine (CYMBALTA) 30 MG capsule Take 1 capsule (30 mg total) by mouth daily. 10/19/15 10/18/16  Adam Johnson, MD   Meds Ordered and Administered this Visit  Medications - No data to display  BP 107/67 (BP Location: Right Arm)   Pulse 93   Temp 98.4 F (36.9 C) (Oral)   Resp 18   Ht 5' 4" (1.626 m)   Wt 180 lb (81.6 kg)   SpO2 99%   BMI 30.90 kg/m  No data found.   Physical Exam  Constitutional: She is oriented to person, place, and time. She appears well-developed and well-nourished.  HENT:  Head: Normocephalic and atraumatic.  Eyes: Conjunctivae are normal.  Neck: Normal range of motion.  Pulmonary/Chest: Effort normal.  Musculoskeletal: Normal range of motion.  Neurological: She is alert and oriented to person, place, and time.  Skin: Skin is warm.  Psychiatric: She has a normal mood and affect.  Nursing note and vitals reviewed.   Urgent Care Course   Clinical Course    Procedures (including critical care time)  Labs Review Labs Reviewed - No data to display  Imaging Review No results found.   Visual Acuity Review  Right Eye Distance:   Left Eye Distance:   Bilateral Distance:    Right Eye Near:   Left Eye Near:    Bilateral Near:         MDM   1. Urinary tract infection without hematuria, site unspecified         Jennifer C Omohundro, NP 11/08/15 1757  

## 2015-11-11 ENCOUNTER — Other Ambulatory Visit: Payer: Self-pay | Admitting: *Deleted

## 2015-11-11 DIAGNOSIS — N183 Chronic kidney disease, stage 3 unspecified: Secondary | ICD-10-CM

## 2015-11-11 DIAGNOSIS — E1122 Type 2 diabetes mellitus with diabetic chronic kidney disease: Secondary | ICD-10-CM

## 2015-11-11 MED ORDER — PEN NEEDLES 31G X 5 MM MISC: 1.0000 | Freq: Every day | 3 refills | Status: DC

## 2015-11-11 NOTE — Telephone Encounter (Signed)
[facility] called stating they needed a pen needle script for pt and that they were filling insulin today, they were informed pt just picked up insulin at [facility] yesterday for 25 days, 2 pens per teresa at [facility]d.pt now has medicaid and will be using [facility]

## 2015-11-16 NOTE — Progress Notes (Signed)
Internal Medicine Clinic Attending  I saw and evaluated the patient.  I personally confirmed the key portions of the history and exam documented by Dr. Amin and I reviewed pertinent patient test results.  The assessment, diagnosis, and plan were formulated together and I agree with the documentation in the resident's note. 

## 2015-11-23 ENCOUNTER — Ambulatory Visit (HOSPITAL_COMMUNITY)
Admission: EM | Admit: 2015-11-23 | Discharge: 2015-11-23 | Disposition: A | Discharge status: Home / Self Care | Payer: Medicaid Other | Attending: Family Medicine | Admitting: Family Medicine

## 2015-11-23 ENCOUNTER — Encounter (HOSPITAL_COMMUNITY): Payer: Self-pay | Admitting: Emergency Medicine

## 2015-11-23 DIAGNOSIS — N3000 Acute cystitis without hematuria: Secondary | ICD-10-CM

## 2015-11-23 LAB — POCT URINALYSIS DIP (DEVICE)
Bilirubin Urine: NEGATIVE
GLUCOSE, UA: 500 mg/dL — AB
Hgb urine dipstick: NEGATIVE
KETONES UR: NEGATIVE mg/dL
Nitrite: NEGATIVE
Protein, ur: NEGATIVE mg/dL
SPECIFIC GRAVITY, URINE: 1.015 (ref 1.005–1.030)
Urobilinogen, UA: 0.2 mg/dL (ref 0.0–1.0)
pH: 5.5 (ref 5.0–8.0)

## 2015-11-23 MED ORDER — CIPROFLOXACIN HCL 500 MG PO TABS: 500.0000 mg | ORAL_TABLET | Freq: Two times a day (BID) | ORAL | 0 refills | Status: DC

## 2015-11-23 NOTE — ED Notes (Signed)
Patients thumb soaked in a betadine solution.

## 2015-11-23 NOTE — ED Triage Notes (Signed)
Patient presents today to [facility], with Urinary Frequency and Lower Back Pain.

## 2015-11-23 NOTE — ED Provider Notes (Signed)
[facility]    CSN: [id number] Arrival date & time: 11/23/15  1833     History   Chief Complaint Chief Complaint  Patient presents with  . Urinary Frequency  . Back Pain    HPI [name] is a 60 y.o. female.   This 60 year old woman who has a disabled daughter in [place] that requires constant care. She's been working hard to make sure her daughter [name] gets good care and has collected to take care of herself. She's developed some urinary frequency lately the last few days.  She's had no back pain, vomiting, or fever      Past Medical History:  Diagnosis Date  . Anemia    due to menorrhagia, BL 8-10  . Anxiety   . Bilateral renal cysts 01/15/2009   Qualifier: Diagnosis of  By: Tyrell Antonio MD, Belkys    . Chronic abdominal pain   . Chronic back pain   . CKD (chronic kidney disease) stage 3, GFR 30-59 ml/min    baseline creatinine 1.4-1.7  . Colitis   . Congenital heart defect    surgically corrected as a child  . DDD (degenerative disc disease), lumbar   . Depression   . Diabetes mellitus type II, uncontrolled ([place])   . Diabetes mellitus without complication ([place])   . History of palpitations    evaluated recently 12'15  . Hyperlipidemia   . Hypertension   . IBS (irritable bowel syndrome)   . Ovarian cyst, left   . Postmenopausal bleeding 06/12/2008  . UTI (lower urinary tract infection)   . Vaginal cyst    nabothian and bartholin    Patient Active Problem List   Diagnosis Date Noted  . Weight gain 10/19/2015  . Osteoarthritis of left knee 06/29/2015  . Bicornuate uterus 06/22/2015  . RLQ abdominal pain 06/12/2015  . Hyperkalemia 04/27/2015  . Recurrent UTI (urinary tract infection) 12/24/2014  . Hyponatremia 09/12/2014  . GERD (gastroesophageal reflux disease) 01/06/2014  . Urge incontinence 11/26/2013  . Chronic pain 09/13/2012  . Preventative health care 11/03/2010  . Bilateral renal cysts 01/15/2009  . Anxiety state 06/12/2008    . CKD (chronic kidney disease) stage 3, GFR 30-59 ml/min 06/12/2008  . Diabetes mellitus with stage 3 chronic kidney disease ([place]) 12/28/2005  . Hyperlipidemia associated with type 2 diabetes mellitus ([place]) 12/28/2005  . Hypertension associated with diabetes ([place]) 12/28/2005    Past Surgical History:  Procedure Laterality Date  . CARDIAC SURGERY     to repair congenital defect as a child- 35month old  . COLONOSCOPY WITH PROPOFOL N/A 10/01/2014   Procedure: COLONOSCOPY WITH PROPOFOL;  Surgeon: WArta Silence MD;  [facility];  Service: Endoscopy;  Laterality: N/A;  . ESOPHAGOGASTRODUODENOSCOPY (EGD) WITH PROPOFOL N/A 10/01/2014   Procedure: ESOPHAGOGASTRODUODENOSCOPY (EGD) WITH PROPOFOL;  Surgeon: WArta Silence MD;  [facility];  Service: Endoscopy;  Laterality: N/A;    OB History    Gravida Para Term Preterm AB Living   _0 SAB TAB Ectopic Multiple Live Births   2       1       Home Medications    Prior to Admission medications   Medication Sig Start Date End Date Taking? Authorizing Provider  ACCU-CHEK FASTCLIX LANCETS MISC Check blood sugar two times a day as instructed 11/04/15  Yes Carly J Rivet, MD  acetaminophen (TYLENOL) 650 MG CR tablet Take 1,300 mg by mouth 2 (two) times daily.  Yes Historical Provider, MD  Blood Glucose Monitoring Suppl (ACCU-CHEK AVIVA PLUS) w/Device KIT Check blood sugar two times a day 11/04/15  Yes Carly J Rivet, MD  DULoxetine (CYMBALTA) 30 MG capsule Take 1 capsule (30 mg total) by mouth daily. 10/19/15 10/18/16 Yes Asencion Partridge, MD  enalapril (VASOTEC) 10 MG tablet Take 1 tablet (10 mg total) by mouth daily. 10/19/15  Yes Asencion Partridge, MD  gabapentin (NEURONTIN) 300 MG capsule Take 300 mg by mouth 2 (two) times daily.   Yes Historical Provider, MD  glucose blood (ACCU-CHEK AVIVA PLUS) test strip Check blood sugar two times a day as instructed 11/04/15  Yes Carly Montey Hora, MD  Insulin Glargine (LANTUS SOLOSTAR) 100  UNIT/ML Solostar Pen Take 12 units in the morning and 12 units in the evening. 11/03/15  Yes Lorella Nimrod, MD  Insulin Pen Needle (PEN NEEDLES) 31G X 5 MM MISC 1 Syringe by Does not apply route daily at 10 pm. Use to inject insulin into the skin daily at 10 pm. diag code E11.22. Insulin dependent 11/11/15  Yes Oval Linsey, MD  ciprofloxacin (CIPRO) 500 MG tablet Take 1 tablet (500 mg total) by mouth every 12 (twelve) hours. 11/23/15   Robyn Haber, MD    Family History Family History  Problem Relation Age of Onset  . Stroke Father   . Heart attack Father     Had MI in his 9s  . Stomach cancer Paternal Grandmother   . Diabetes Maternal Grandmother   . Cerebral palsy Daughter   . Anesthesia problems Neg Hx   . Hypotension Neg Hx   . Malignant hyperthermia Neg Hx   . Pseudochol deficiency Neg Hx     Social History Social History  Substance Use Topics  . Smoking status: Never Smoker  . Smokeless tobacco: Never Used  . Alcohol use No     Allergies   Review of patient's allergies indicates no known allergies.   Review of Systems Review of Systems  Constitutional: Positive for fatigue.  HENT: Negative.   Eyes: Negative.   Respiratory: Negative.   Cardiovascular: Negative.   Gastrointestinal: Negative.   Genitourinary: Positive for frequency.  Musculoskeletal: Positive for arthralgias and back pain.     Physical Exam Triage Vital Signs ED Triage Vitals  Enc Vitals Group     BP 11/23/15 2018 153/73     Pulse Rate 11/23/15 2018 91     Resp 11/23/15 2018 16     Temp 11/23/15 2018 98.4 F (36.9 C)     Temp Source 11/23/15 2018 Oral     SpO2 11/23/15 2018 100 %     Weight --      Height --      Head Circumference --      Peak Flow --      Pain Score 11/23/15 2039 1     Pain Loc --      Pain Edu? --      Excl. in [place]? --    No data found.   Updated Vital Signs BP 153/73 (BP Location: Left Arm)   Pulse 91   Temp 98.4 F (36.9 C) (Oral)   Resp 16   SpO2  100%       Physical Exam  Constitutional: She is oriented to person, place, and time. She appears well-developed and well-nourished.  HENT:  Head: Normocephalic.  Right Ear: External ear normal.  Left Ear: External ear normal.  Nose: Nose normal.  Mouth/Throat: Oropharynx is clear and moist.  Eyes: Conjunctivae and EOM are normal. Pupils are equal, round, and reactive to light.  Neck: Normal range of motion. Neck supple.  Pulmonary/Chest: Effort normal.  Abdominal: Soft. There is no tenderness.  Musculoskeletal: Normal range of motion.  Neurological: She is alert and oriented to person, place, and time.  Skin: Skin is warm and dry.  Psychiatric:  Hypomanic  Nursing note and vitals reviewed.    UC Treatments / Results  Labs (all labs ordered are listed, but only abnormal results are displayed) Labs Reviewed  POCT URINALYSIS DIP (DEVICE) - Abnormal; Notable for the following:       Result Value   Glucose, UA 500 (*)    Leukocytes, UA SMALL (*)    All other components within normal limits    EKG  EKG Interpretation None       Radiology No results found.  Procedures Procedures (including critical care time)  Medications Ordered in UC Medications - No data to display   Initial Impression / Assessment and Plan / UC Course  I have reviewed the triage vital signs and the nursing notes.  Pertinent labs & imaging results that were available during my care of the patient were reviewed by me and considered in my medical decision making (see chart for details).  Clinical Course    Results for orders placed or performed during the hospital encounter of 11/23/15  POCT urinalysis dip (device)  Result Value Ref Range   Glucose, UA 500 (A) NEGATIVE mg/dL   Bilirubin Urine NEGATIVE NEGATIVE   Ketones, ur NEGATIVE NEGATIVE mg/dL   Specific Gravity, Urine 1.015 1.005 - 1.030   Hgb urine dipstick NEGATIVE NEGATIVE   pH 5.5 5.0 - 8.0   Protein, ur NEGATIVE NEGATIVE  mg/dL   Urobilinogen, UA 0.2 0.0 - 1.0 mg/dL   Nitrite NEGATIVE NEGATIVE   Leukocytes, UA SMALL (A) NEGATIVE     Final Clinical Impressions(s) / UC Diagnoses   Final diagnoses:  Acute cystitis without hematuria    New Prescriptions Current Discharge Medication List    Cipro 500 mg twice a day 5 days   Robyn Haber, MD 11/23/15 2057

## 2015-11-27 ENCOUNTER — Encounter (HOSPITAL_COMMUNITY): Payer: Self-pay | Admitting: Emergency Medicine

## 2015-11-27 ENCOUNTER — Emergency Department (HOSPITAL_COMMUNITY): Payer: Medicaid Other

## 2015-11-27 ENCOUNTER — Emergency Department (HOSPITAL_COMMUNITY)
Admission: EM | Admit: 2015-11-27 | Discharge: 2015-11-27 | Disposition: A | Discharge status: Home / Self Care | Payer: Medicaid Other | Attending: Emergency Medicine | Admitting: Emergency Medicine

## 2015-11-27 DIAGNOSIS — Z794 Long term (current) use of insulin: Secondary | ICD-10-CM | POA: Insufficient documentation

## 2015-11-27 DIAGNOSIS — R002 Palpitations: Secondary | ICD-10-CM | POA: Insufficient documentation

## 2015-11-27 DIAGNOSIS — N183 Chronic kidney disease, stage 3 (moderate): Secondary | ICD-10-CM | POA: Insufficient documentation

## 2015-11-27 DIAGNOSIS — R5383 Other fatigue: Secondary | ICD-10-CM

## 2015-11-27 DIAGNOSIS — Z79899 Other long term (current) drug therapy: Secondary | ICD-10-CM | POA: Insufficient documentation

## 2015-11-27 DIAGNOSIS — R079 Chest pain, unspecified: Secondary | ICD-10-CM | POA: Diagnosis present

## 2015-11-27 DIAGNOSIS — I129 Hypertensive chronic kidney disease with stage 1 through stage 4 chronic kidney disease, or unspecified chronic kidney disease: Secondary | ICD-10-CM | POA: Insufficient documentation

## 2015-11-27 DIAGNOSIS — E1122 Type 2 diabetes mellitus with diabetic chronic kidney disease: Secondary | ICD-10-CM | POA: Diagnosis not present

## 2015-11-27 LAB — BASIC METABOLIC PANEL
Anion gap: 9 (ref 5–15)
BUN: 48 mg/dL — AB (ref 6–20)
CHLORIDE: 98 mmol/L — AB (ref 101–111)
CO2: 26 mmol/L (ref 22–32)
Calcium: 8.9 mg/dL (ref 8.9–10.3)
Creatinine, Ser: 1.71 mg/dL — ABNORMAL HIGH (ref 0.44–1.00)
GFR calc Af Amer: 37 mL/min — ABNORMAL LOW (ref >60)
GFR calc non Af Amer: 32 mL/min — ABNORMAL LOW (ref >60)
GLUCOSE: 313 mg/dL — AB (ref 65–99)
POTASSIUM: 4.4 mmol/L (ref 3.5–5.1)
SODIUM: 133 mmol/L — AB (ref 135–145)

## 2015-11-27 LAB — I-STAT TROPONIN, ED: Troponin i, poc: 0.01 ng/mL (ref 0.00–0.08)

## 2015-11-27 LAB — CBC
HEMATOCRIT: 36 % (ref 36.0–46.0)
Hemoglobin: 12.1 g/dL (ref 12.0–15.0)
MCH: 30 pg (ref 26.0–34.0)
MCHC: 33.6 g/dL (ref 30.0–36.0)
MCV: 89.1 fL (ref 78.0–100.0)
Platelets: 225 10*3/uL (ref 150–400)
RBC: 4.04 MIL/uL (ref 3.87–5.11)
RDW: 12.3 % (ref 11.5–15.5)
WBC: 8.7 10*3/uL (ref 4.0–10.5)

## 2015-11-27 NOTE — ED Notes (Signed)
MD at bedside. 

## 2015-11-27 NOTE — ED Provider Notes (Signed)
[facility] DEPT Provider Note   CSN: [id number] Arrival date & time: 11/27/15  1546     History   Chief Complaint Chief Complaint  Patient presents with  . Chest Pain    Fluttering  . Extremity Pain    HPI Ieshia Biddy is a 60 y.o. female.  The history is provided by the patient.  Palpitations   This is a new problem. The current episode started 2 days ago. The problem occurs rarely. The problem has been resolved. The problem is associated with stress and anxiety. On average, each episode lasts 30 seconds. Pertinent negatives include no diaphoresis, no fever, no chest pain, no chest pressure, no syncope, no lower extremity edema and no shortness of breath. She has tried nothing for the symptoms.    Past Medical History:  Diagnosis Date  . Anemia    due to menorrhagia, BL 8-10  . Anxiety   . Bilateral renal cysts 01/15/2009   Qualifier: Diagnosis of  By: Tyrell Antonio MD, Belkys    . Chronic abdominal pain   . Chronic back pain   . CKD (chronic kidney disease) stage 3, GFR 30-59 ml/min    baseline creatinine 1.4-1.7  . Colitis   . Congenital heart defect    surgically corrected as a child  . DDD (degenerative disc disease), lumbar   . Depression   . Diabetes mellitus type II, uncontrolled ([place])   . Diabetes mellitus without complication ([place])   . History of palpitations    evaluated recently 12'15  . Hyperlipidemia   . Hypertension   . IBS (irritable bowel syndrome)   . Ovarian cyst, left   . Postmenopausal bleeding 06/12/2008  . UTI (lower urinary tract infection)   . Vaginal cyst    nabothian and bartholin    Patient Active Problem List   Diagnosis Date Noted  . Weight gain 10/19/2015  . Osteoarthritis of left knee 06/29/2015  . Bicornuate uterus 06/22/2015  . RLQ abdominal pain 06/12/2015  . Hyperkalemia 04/27/2015  . Recurrent UTI (urinary tract infection) 12/24/2014  . Hyponatremia 09/12/2014  . GERD (gastroesophageal reflux disease) 01/06/2014  .  Urge incontinence 11/26/2013  . Chronic pain 09/13/2012  . Preventative health care 11/03/2010  . Bilateral renal cysts 01/15/2009  . Anxiety state 06/12/2008  . CKD (chronic kidney disease) stage 3, GFR 30-59 ml/min 06/12/2008  . Diabetes mellitus with stage 3 chronic kidney disease ([place]) 12/28/2005  . Hyperlipidemia associated with type 2 diabetes mellitus ([place]) 12/28/2005  . Hypertension associated with diabetes ([place]) 12/28/2005    Past Surgical History:  Procedure Laterality Date  . CARDIAC SURGERY     to repair congenital defect as a child- 22month old  . COLONOSCOPY WITH PROPOFOL N/A 10/01/2014   Procedure: COLONOSCOPY WITH PROPOFOL;  Surgeon: WArta Silence MD;  [facility];  Service: Endoscopy;  Laterality: N/A;  . ESOPHAGOGASTRODUODENOSCOPY (EGD) WITH PROPOFOL N/A 10/01/2014   Procedure: ESOPHAGOGASTRODUODENOSCOPY (EGD) WITH PROPOFOL;  Surgeon: WArta Silence MD;  [facility];  Service: Endoscopy;  Laterality: N/A;    OB History    Gravida Para Term Preterm AB Living   3 1 1   2 1    SAB TAB Ectopic Multiple Live Births   2       1       Home Medications    Prior to Admission medications   Medication Sig Start Date End Date Taking? Authorizing Provider  acetaminophen (TYLENOL) 500 MG tablet Take 1,000 mg by mouth every 6 (six) hours  as needed for moderate pain.   Yes Historical Provider, MD  acetaminophen (TYLENOL) 650 MG CR tablet Take 1,300 mg by mouth 2 (two) times daily.    Yes Historical Provider, MD  ciprofloxacin (CIPRO) 500 MG tablet Take 1 tablet (500 mg total) by mouth every 12 (twelve) hours. 11/23/15  Yes Robyn Haber, MD  enalapril (VASOTEC) 10 MG tablet Take 1 tablet (10 mg total) by mouth daily. 10/19/15  Yes Asencion Partridge, MD  gabapentin (NEURONTIN) 300 MG capsule Take 300 mg by mouth 2 (two) times daily.   Yes Historical Provider, MD  Insulin Glargine (LANTUS SOLOSTAR) 100 UNIT/ML Solostar Pen Take 12 units in the morning and 12  units in the evening. 11/03/15  Yes Lorella Nimrod, MD  ACCU-CHEK FASTCLIX LANCETS MISC Check blood sugar two times a day as instructed 11/04/15   Juliet Rude, MD  Blood Glucose Monitoring Suppl (ACCU-CHEK AVIVA PLUS) w/Device KIT Check blood sugar two times a day 11/04/15   Juliet Rude, MD  DULoxetine (CYMBALTA) 30 MG capsule Take 1 capsule (30 mg total) by mouth daily. Patient not taking: Reported on 11/27/2015 10/19/15 10/18/16  Asencion Partridge, MD  glucose blood (ACCU-CHEK AVIVA PLUS) test strip Check blood sugar two times a day as instructed 11/04/15   Juliet Rude, MD  Insulin Pen Needle (PEN NEEDLES) 31G X 5 MM MISC 1 Syringe by Does not apply route daily at 10 pm. Use to inject insulin into the skin daily at 10 pm. diag code E11.22. Insulin dependent 11/11/15   Oval Linsey, MD    Family History Family History  Problem Relation Age of Onset  . Stroke Father   . Heart attack Father     Had MI in his 88s  . Stomach cancer Paternal Grandmother   . Diabetes Maternal Grandmother   . Cerebral palsy Daughter   . Anesthesia problems Neg Hx   . Hypotension Neg Hx   . Malignant hyperthermia Neg Hx   . Pseudochol deficiency Neg Hx     Social History Social History  Substance Use Topics  . Smoking status: Never Smoker  . Smokeless tobacco: Never Used  . Alcohol use No     Allergies   Review of patient's allergies indicates no known allergies.   Review of Systems Review of Systems  Constitutional: Negative for diaphoresis and fever.  Respiratory: Negative for shortness of breath.   Cardiovascular: Positive for palpitations. Negative for chest pain and syncope.  All other systems reviewed and are negative.    Physical Exam Updated Vital Signs BP 130/71 (BP Location: Left Arm)   Pulse 84   Temp 98.2 F (36.8 C) (Oral)   Resp 18   Ht 5' 4"  (1.626 m)   Wt 185 lb (83.9 kg)   SpO2 94%   BMI 31.76 kg/m   Physical Exam  Constitutional: She is oriented to person, place, and  time. She appears well-developed and well-nourished. No distress.  HENT:  Head: Normocephalic.  Nose: Nose normal.  Eyes: Conjunctivae are normal.  Neck: Neck supple. No tracheal deviation present.  Cardiovascular: Normal rate, regular rhythm and normal heart sounds.  Exam reveals no friction rub.   No murmur heard. Pulmonary/Chest: Effort normal and breath sounds normal. No respiratory distress. She exhibits no tenderness.  Abdominal: Soft. She exhibits no distension. There is no tenderness.  Neurological: She is alert and oriented to person, place, and time.  Skin: Skin is warm and dry.  Psychiatric: She has a normal mood  and affect.     ED Treatments / Results  Labs (all labs ordered are listed, but only abnormal results are displayed) Labs Reviewed  BASIC METABOLIC PANEL - Abnormal; Notable for the following:       Result Value   Sodium 133 (*)    Chloride 98 (*)    Glucose, Bld 313 (*)    BUN 48 (*)    Creatinine, Ser 1.71 (*)    GFR calc non Af Amer 32 (*)    GFR calc Af Amer 37 (*)    All other components within normal limits  CBC  I-STAT TROPOININ, ED    EKG  EKG Interpretation  Date/Time:  Friday November 27 2015 16:08:33 EDT Ventricular Rate:  87 PR Interval:    QRS Duration: 115 QT Interval:  361 QTC Calculation: 435 R Axis:   25 Text Interpretation:  Sinus rhythm Incomplete right bundle branch block Low voltage, precordial leads Baseline wander in lead(s) III V2 since last tracing no significant change Confirmed by BELFI  MD, MELANIE ([id number]) on 11/27/2015 4:50:24 PM       Radiology Dg Chest 2 View  Result Date: 11/27/2015 CLINICAL DATA:  60 year old female with chest discomfort for 1 week. EXAM: CHEST  2 VIEW COMPARISON:  05/30/2015 and prior chest radiographs dating back to 2007 FINDINGS: The cardiomediastinal silhouette is unremarkable. Mild peribronchial thickening is unchanged. There is no evidence of focal airspace disease, pulmonary edema,  suspicious pulmonary nodule/mass, pleural effusion, or pneumothorax. No acute bony abnormalities are identified. IMPRESSION: No active cardiopulmonary disease. Electronically Signed   By: Margarette Canada M.D.   On: 11/27/2015 16:41    Procedures Procedures (including critical care time)  Medications Ordered in ED Medications - No data to display   Initial Impression / Assessment and Plan / ED Course  I have reviewed the triage vital signs and the nursing notes.  Pertinent labs & imaging results that were available during my care of the patient were reviewed by me and considered in my medical decision making (see chart for details).  Clinical Course    60 y.o. female presents with occasional palpitations after losing sleep driving back and forth to appointments for her daughter who has been ill. She states she has had increased anxiety from this. She also complaints of diffuse body aches which are chronic in nature. EK without signs of impending arrhythmia and troponin negative. She wishes to go home and rest which I feel is appropriate given her good clinical appearance. Plan to follow up with PCP as needed and return precautions discussed for worsening or new concerning symptoms.   Final Clinical Impressions(s) / ED Diagnoses   Final diagnoses:  Palpitations  Fatigue, unspecified type    New Prescriptions Discharge Medication List as of 11/27/2015  6:05 PM       Leo Grosser, MD 11/28/15 [phone]

## 2015-11-27 NOTE — ED Triage Notes (Signed)
Pt presents with chest fluttering for about a week but denies pain. Pt continues to reports acute chronic left hand, right shoulder, and left knee pain. Pt hx of pinched nerve and knee "bone on bone."

## 2015-11-27 NOTE — ED Notes (Signed)
Bed: WA17 Expected date:  Expected time:  Means of arrival:  Comments: Housekeeping

## 2015-11-30 ENCOUNTER — Ambulatory Visit (INDEPENDENT_AMBULATORY_CARE_PROVIDER_SITE_OTHER): Payer: Self-pay | Admitting: Internal Medicine

## 2015-11-30 ENCOUNTER — Telehealth: Payer: Self-pay | Admitting: General Practice

## 2015-11-30 VITALS — BP 121/52 | HR 90 | Temp 98.7°F | Ht 64.0 in | Wt 181.5 lb

## 2015-11-30 DIAGNOSIS — M25512 Pain in left shoulder: Secondary | ICD-10-CM

## 2015-11-30 DIAGNOSIS — M25562 Pain in left knee: Secondary | ICD-10-CM

## 2015-11-30 DIAGNOSIS — M25561 Pain in right knee: Secondary | ICD-10-CM

## 2015-11-30 DIAGNOSIS — R1031 Right lower quadrant pain: Secondary | ICD-10-CM

## 2015-11-30 DIAGNOSIS — M47896 Other spondylosis, lumbar region: Secondary | ICD-10-CM

## 2015-11-30 DIAGNOSIS — M25551 Pain in right hip: Secondary | ICD-10-CM | POA: Insufficient documentation

## 2015-11-30 DIAGNOSIS — G8929 Other chronic pain: Secondary | ICD-10-CM

## 2015-11-30 DIAGNOSIS — M159 Polyosteoarthritis, unspecified: Secondary | ICD-10-CM

## 2015-11-30 DIAGNOSIS — M25511 Pain in right shoulder: Secondary | ICD-10-CM

## 2015-11-30 DIAGNOSIS — Q6101 Congenital single renal cyst: Secondary | ICD-10-CM

## 2015-11-30 DIAGNOSIS — Z87440 Personal history of urinary (tract) infections: Secondary | ICD-10-CM

## 2015-11-30 DIAGNOSIS — M5136 Other intervertebral disc degeneration, lumbar region: Secondary | ICD-10-CM

## 2015-11-30 NOTE — Assessment & Plan Note (Signed)
Patient reports a 2 day history of an achy lower back pain with radiation into her right groin. She was seen at [facility] for her pain. A CT abd/pelvis showed a small complex cyst of the right mid kidney. Prior imaging showed bilateral cysts. A plain film of the L-spine was also obtained which showed lumbar spondylosis and severe degenerative disc changes at L1-L2. Patient states her pain is improving today but complains of chronic joint pain in her shoulders and knees. She has tried Tylenol Extra Strength with some relief. She also has tried ice and heat packs with some relief.   Of note, she is frequently seen in the ED or urgent care. She was treated twice for UTI with Ciprofloxacin this month and states she is about to finish her antibiotics. She currently denies any dysuria, hematuria, frequency, urgency, N/V/D/C, numbness, tingling.  Patient's pain is likely osteoarthritic pain of her right hip with associated lumbar DDD. She does not have urinary symptoms, CVA or suprapubic tenderness, fevers or chills, to suggest cystitis or pyelonephritis. She does have multiple joints impacted by OA. Have recommended continued conservative therapy as pain is currently tolerable. -Continue Tylenol 1000 mg TID prn; limit to 3000 mg total daily -Continue heat/ice -Advised patient she may try OTC arthritis creams -Provided diabetic diet information for patient for weight loss

## 2015-11-30 NOTE — Telephone Encounter (Signed)
Patient called and left message stating she is having pain in her right lower side and would like a call back.

## 2015-11-30 NOTE — Progress Notes (Signed)
.     CC: Right back and groin pain  HPI:  [name] is a 60 y.o. female with PMH as listed below who presents for management of her right back and groin pain. She is accompanied by her daughter, [name], who provides additional history.  Patient reports a 2 day history of an achy lower back pain with radiation into her right groin. She was seen at [facility] for her pain. A CT abd/pelvis showed a small complex cyst of the right mid kidney. Prior imaging showed bilateral cysts. A plain film of the L-spine was also obtained which showed lumbar spondylosis and severe degenerative disc changes at L1-L2. Patient states her pain is improving today but complains of chronic joint pain in her shoulders and knees. She has tried Tylenol Extra Strength with some relief. She also has tried ice and heat packs with some relief.   Of note, she is frequently seen in the ED or urgent care. She was treated twice for UTI with Ciprofloxacin this month and states she is about to finish her antibiotics. She currently denies any dysuria, hematuria, frequency, urgency, N/V/D/C, numbness, tingling.  Past Medical History:  Diagnosis Date  . Anemia    due to menorrhagia, BL 8-10  . Anxiety   . Bilateral renal cysts 01/15/2009   Qualifier: Diagnosis of  By: Tyrell Antonio MD, [place]    . Chronic abdominal pain   . Chronic back pain   . CKD (chronic kidney disease) stage 3, GFR 30-59 ml/min    baseline creatinine 1.4-1.7  . Colitis   . Congenital heart defect    surgically corrected as a child  . DDD (degenerative disc disease), lumbar   . Depression   . Diabetes mellitus type II, uncontrolled ([place])   . Diabetes mellitus without complication ([place])   . History of palpitations    evaluated recently 12'15  . Hyperlipidemia   . Hypertension   . IBS (irritable bowel syndrome)   . Ovarian cyst, left   . Postmenopausal bleeding 06/12/2008  . UTI (lower urinary tract infection)   . Vaginal cyst    nabothian and bartholin      Review of Systems:   Review of Systems  Constitutional: Negative for chills and fever.  Respiratory: Negative for shortness of breath.   Cardiovascular: Negative for chest pain.  Gastrointestinal: Negative for constipation, diarrhea, nausea and vomiting.  Genitourinary: Negative for dysuria, flank pain, frequency, hematuria and urgency.  Musculoskeletal: Positive for joint pain.     Physical Exam:  Vitals:   11/30/15 1546  BP: (!) 121/52  Pulse: 90  Temp: 98.7 F (37.1 C)  TempSrc: Oral  SpO2: 97%  Weight: 181 lb 8 oz (82.3 kg)  Height: 5\' 4"  (1.626 m)   Physical Exam  Constitutional: She is oriented to person, place, and time. She appears well-nourished. No distress.  Abdominal: Soft. She exhibits no distension and no mass. There is no tenderness. There is no guarding and no CVA tenderness.  Musculoskeletal:  Mild thoracic spinal tenderness. Osteoarthritic nodule at DIP of 3rd digit on right hand.  Neurological: She is alert and oriented to person, place, and time.  Skin: Skin is warm.    Assessment & Plan:   See Encounters Tab for problem based charting.  Patient discussed with Dr. Angelia Mould

## 2015-11-30 NOTE — Patient Instructions (Addendum)
It was a pleasure to meet you Ms. Villaflor.  Your low back and right groin pain is unlikely to be from a urinary tract infection. This may be related to hip pain or pain from your lower back from arthritic changes.  Please continue Tylenol as needed. Also use ice and/or heat packs 15-20 minutes at at time every 2 hours for pain as needed.  You can try over the counter arthritis creams for joint pain.   Diabetes Mellitus and Food It is important for you to manage your blood sugar (glucose) level. Your blood glucose level can be greatly affected by what you eat. Eating healthier foods in the appropriate amounts throughout the day at about the same time each day will help you control your blood glucose level. It can also help slow or prevent worsening of your diabetes mellitus. Healthy eating may even help you improve the level of your blood pressure and reach or maintain a healthy weight.  General recommendations for healthful eating and cooking habits include:  Eating meals and snacks regularly. Avoid going long periods of time without eating to lose weight.  Eating a diet that consists mainly of plant-based foods, such as fruits, vegetables, nuts, legumes, and whole grains.  Using low-heat cooking methods, such as baking, instead of high-heat cooking methods, such as deep frying. Work with your dietitian to make sure you understand how to use the Nutrition Facts information on food labels. HOW CAN FOOD AFFECT ME? Carbohydrates Carbohydrates affect your blood glucose level more than any other type of food. Your dietitian will help you determine how many carbohydrates to eat at each meal and teach you how to count carbohydrates. Counting carbohydrates is important to keep your blood glucose at a healthy level, especially if you are using insulin or taking certain medicines for diabetes mellitus. Alcohol Alcohol can cause sudden decreases in blood glucose (hypoglycemia), especially if you use  insulin or take certain medicines for diabetes mellitus. Hypoglycemia can be a life-threatening condition. Symptoms of hypoglycemia (sleepiness, dizziness, and disorientation) are similar to symptoms of having too much alcohol.  If your health care provider has given you approval to drink alcohol, do so in moderation and use the following guidelines:  Women should not have more than one drink per day, and men should not have more than two drinks per day. One drink is equal to:  12 oz of beer.  5 oz of wine.  1 oz of hard liquor.  Do not drink on an empty stomach.  Keep yourself hydrated. Have water, diet soda, or unsweetened iced tea.  Regular soda, juice, and other mixers might contain a lot of carbohydrates and should be counted. WHAT FOODS ARE NOT RECOMMENDED? As you make food choices, it is important to remember that all foods are not the same. Some foods have fewer nutrients per serving than other foods, even though they might have the same number of calories or carbohydrates. It is difficult to get your body what it needs when you eat foods with fewer nutrients. Examples of foods that you should avoid that are high in calories and carbohydrates but low in nutrients include:  Trans fats (most processed foods list trans fats on the Nutrition Facts label).  Regular soda.  Juice.  Candy.  Sweets, such as cake, pie, doughnuts, and cookies.  Fried foods. WHAT FOODS CAN I EAT? Eat nutrient-rich foods, which will nourish your body and keep you healthy. The food you should eat also will depend on several  factors, including:  The calories you need.  The medicines you take.  Your weight.  Your blood glucose level.  Your blood pressure level.  Your cholesterol level. You should eat a variety of foods, including:  Protein.  Lean cuts of meat.  Proteins low in saturated fats, such as fish, egg whites, and beans. Avoid processed meats.  Fruits and vegetables.  Fruits and  vegetables that may help control blood glucose levels, such as apples, mangoes, and yams.  Dairy products.  Choose fat-free or low-fat dairy products, such as milk, yogurt, and cheese.  Grains, bread, pasta, and rice.  Choose whole grain products, such as multigrain bread, whole oats, and brown rice. These foods may help control blood pressure.  Fats.  Foods containing healthful fats, such as nuts, avocado, olive oil, canola oil, and fish. DOES EVERYONE WITH DIABETES MELLITUS HAVE THE SAME MEAL PLAN? Because every person with diabetes mellitus is different, there is not one meal plan that works for everyone. It is very important that you meet with a dietitian who will help you create a meal plan that is just right for you.   This information is not intended to replace advice given to you by your health care provider. Make sure you discuss any questions you have with your health care provider.   Document Released: 10/21/2004 Document Revised: 02/14/2014 Document Reviewed: 12/21/2012 Elsevier Interactive Patient Education [employer].

## 2015-12-01 NOTE — Telephone Encounter (Signed)
Pt seen in internal medicine office for this concern.

## 2015-12-01 NOTE — Progress Notes (Signed)
Internal Medicine Clinic Attending  Case discussed with Dr. Patel,Vishal at the time of the visit.  We reviewed the resident's history and exam and pertinent patient test results.  I agree with the assessment, diagnosis, and plan of care documented in the resident's note.  

## 2015-12-15 ENCOUNTER — Encounter: Payer: Self-pay | Admitting: Internal Medicine

## 2015-12-18 ENCOUNTER — Encounter (HOSPITAL_COMMUNITY): Payer: Self-pay | Admitting: *Deleted

## 2015-12-18 ENCOUNTER — Inpatient Hospital Stay (HOSPITAL_COMMUNITY)
Admission: AD | Admit: 2015-12-18 | Discharge: 2015-12-18 | Disposition: A | Discharge status: Home / Self Care | Payer: Medicaid Other | Source: Ambulatory Visit | Attending: Obstetrics and Gynecology | Admitting: Obstetrics and Gynecology

## 2015-12-18 DIAGNOSIS — Z7982 Long term (current) use of aspirin: Secondary | ICD-10-CM | POA: Insufficient documentation

## 2015-12-18 DIAGNOSIS — N393 Stress incontinence (female) (male): Secondary | ICD-10-CM | POA: Diagnosis not present

## 2015-12-18 DIAGNOSIS — R1031 Right lower quadrant pain: Secondary | ICD-10-CM | POA: Diagnosis present

## 2015-12-18 DIAGNOSIS — N3946 Mixed incontinence: Secondary | ICD-10-CM | POA: Diagnosis not present

## 2015-12-18 DIAGNOSIS — M479 Spondylosis, unspecified: Secondary | ICD-10-CM | POA: Diagnosis not present

## 2015-12-18 DIAGNOSIS — Z794 Long term (current) use of insulin: Secondary | ICD-10-CM | POA: Insufficient documentation

## 2015-12-18 DIAGNOSIS — R3982 Chronic bladder pain: Secondary | ICD-10-CM

## 2015-12-18 DIAGNOSIS — M545 Low back pain: Secondary | ICD-10-CM | POA: Insufficient documentation

## 2015-12-18 DIAGNOSIS — N3941 Urge incontinence: Secondary | ICD-10-CM

## 2015-12-18 DIAGNOSIS — E119 Type 2 diabetes mellitus without complications: Secondary | ICD-10-CM | POA: Diagnosis not present

## 2015-12-18 LAB — URINALYSIS, ROUTINE W REFLEX MICROSCOPIC
Bilirubin Urine: NEGATIVE
GLUCOSE, UA: 100 mg/dL — AB
HGB URINE DIPSTICK: NEGATIVE
KETONES UR: NEGATIVE mg/dL
Nitrite: NEGATIVE
PH: 5.5 (ref 5.0–8.0)
PROTEIN: NEGATIVE mg/dL
Specific Gravity, Urine: 1.01 (ref 1.005–1.030)

## 2015-12-18 LAB — URINE MICROSCOPIC-ADD ON

## 2015-12-18 MED ORDER — OXYBUTYNIN CHLORIDE ER 10 MG PO TB24: 10.0000 mg | ORAL_TABLET | Freq: Every day | ORAL | 2 refills | Status: DC

## 2015-12-18 NOTE — Discharge Instructions (Signed)
Neurogenic Bladder Neurogenic bladder is a bladder control disorder. It is caused by problems with the nerves that control your bladder. This condition may make your bladder overactive or underactive. You may have trouble holding your urine or passing your urine (urinating). The bladder is a hollow organ in the lower part of your abdomen. It stores urine after the urine is made by your kidneys. Normally, when your bladder is not full, the muscles that control your bladder are relaxed. When your bladder fills with urine, nerve signals are sent to your brain, indicating that your bladder is full. Your brain then sends signals through your spinal cord to the muscles in your bladder that start and stop urine flow. If you have neurogenic bladder, the nerves and muscles do not work together the way they should. CAUSES  Any kind of nerve damage or condition that disrupts the signals from your brain to your bladder can cause neurogenic bladder. Many things can cause these nerve problems. They include:  A disease that affects the nervous system, such as:  Alzheimer disease.  Cerebral palsy.  Multiple sclerosis.  Diabetes.  Parkinson disease.  Damage to your brain or spinal cord from:  Trauma.  Tumor.  Infection.  Surgery.  Alcohol abuse.  Stroke.  A spinal cord birth defect. RISK FACTORS Having nerve damage or a nerve disorder puts you at risk for neurogenic bladder. SIGNS AND SYMPTOMS  Signs and symptoms of neurogenic bladder include:  Leaking or gushing urine (incontinence).  A sudden, strong urge to pass urine (urgency).  Frequent urination during the day and night.  Being unable to empty your bladder completely (urinary retention).  Frequent urinary tract infections. DIAGNOSIS  Your health care provider may diagnose neurogenic bladder based on your symptoms and medical history. A physical exam will also be done. You may be asked to keep a record of your bladder symptoms  and the times that you urinate (bladder diary). Your health care provider may also do several tests to help diagnose neurogenic bladder, including:  A urine test to check for infection.  A bladder scan after you urinate to see how much urine is left in your bladder.  Various tests to measure your urine flow and see how well the flow is controlled (urodynamic tests).  A procedure that involves using a tool that is like a very thin telescope to look through your urethra into your bladder (cystoscopy). A health care provider who specializes in the urinary tract (urologist) may do this test.  Imaging tests of your brain or spine. TREATMENT  Treatment depends on the cause of your neurogenic bladder and the symptoms you are having. Work closely with your health care provider to find the treatments that will improve your quality of life. Treatment options include:  Learning ways to control when you urinate, such as:  Urinating at scheduled times.  Training yourself to delay urination.  Doing exercises (Kegel exercises) to strengthen the muscles that control urine flow.  Avoiding foods or drinks that make your symptoms worse.  Taking medicines to:  Stimulate an underactive bladder.  Relax an overactive bladder.  Treat a urinary tract infection.  Learning how to use a thin tube (catheter) to empty your bladder. A catheter is a hollow tube that you pass through your urethra.  Procedures to stimulate the nerves that control your bladder.  Surgical procedures if other treatments do not help. HOME CARE INSTRUCTIONS   Keep a bladder diary to find out which foods, liquids, or activities  make your symptoms worse.  Use your bladder diary to schedule bathroom trips. If you are away from home, plan to be near a bathroom when your schedule says you need one.  Do Kegel exercises to strengthen the muscles that control urination. These muscles are the ones you use to try to hold urine when you  need to go. To do Kegel exercises:  Squeeze these muscles tight and hold for about 10 seconds.  Repeat three times.  Do these exercises often during the day when you do not have to urinate.  Limit your drinking of beverages that stimulate urination. These include soda, coffee, and tea.  After urinating, wait a few minutes and try again (double voiding).  Make sure you urinate just before you leave the house and just before you go to bed.  Take medicines only as directed by your health care provider.  Keep all follow-up visits as directed by your health care provider. This is important. SEEK MEDICAL CARE IF:   You are having a hard time controlling your symptoms.  Your symptoms are getting worse.  You have signs of a urinary tract infection:  A burning feeling when you urinate.  Chills.  Fever. SEEK IMMEDIATE MEDICAL CARE IF:  You cannot pass urine.    This information is not intended to replace advice given to you by your health care provider. Make sure you discuss any questions you have with your health care provider.   Document Released: 08/07/2006 Document Revised: 02/14/2014 Document Reviewed: 05/07/2013 Elsevier Interactive Patient Education 2016 Else[employer] Inc[employer]   Interstitial Cystitis Interstitial cystitis is a condition that causes inflammation of the bladder. The bladder is a hollow organ in the lower part of your abdomen. It stores urine after the urine is made by your kidneys. With interstitial cystitis, you may have pain in the bladder area. You may also have a frequent and urgent need to urinate. The severity of interstitial cystitis can vary from person to person. You may have flare-ups of the condition, and then it may go away for a while. For many people who have this condition, it becomes a long-term problem. CAUSES The cause of this condition is not known. RISK FACTORS This condition is more likely to develop in women. SYMPTOMS Symptoms of interstitial  cystitis vary, and they can change over time. Symptoms may include:  Discomfort or pain in the bladder area. This can range from mild to severe. The pain may change in intensity as the bladder fills with urine or as it empties.  Pelvic pain.  An urgent need to urinate.  Frequent urination.  Pain during sexual intercourse.  Pinpoint bleeding on the bladder wall. For women, the symptoms often get worse during menstruation. DIAGNOSIS This condition is diagnosed by evaluating your symptoms and ruling out other causes. A physical exam will be done. Various tests may be done to rule out other conditions. Common tests include:  Urine tests.  Cystoscopy. In this test, a tool that is like a very thin telescope is used to look into your bladder.  Biopsy. This involves taking a sample of tissue from the bladder wall to be examined under a microscope. TREATMENT There is no cure for interstitial cystitis, but treatment methods are available to control your symptoms. Work closely with your health care provider to find the treatments that will be most effective for you. Treatment options may include:  Medicines to relieve pain and to help reduce the number of times that you feel the need  to urinate.  Bladder training. This involves learning ways to control when you urinate, such as:  Urinating at scheduled times.  Training yourself to delay urination.  Doing exercises (Kegel exercises) to strengthen the muscles that control urine flow.  Lifestyle changes, such as changing your diet or taking steps to control stress.  Use of a device that provides electrical stimulation in order to reduce pain.  A procedure that stretches your bladder by filling it with air or fluid.  Surgery. This is rare. It is only done for extreme cases if other treatments do not help. HOME CARE INSTRUCTIONS  Take medicines only as directed by your health care provider.  Use bladder training techniques as  directed.  Keep a bladder diary to find out which foods, liquids, or activities make your symptoms worse.  Use your bladder diary to schedule bathroom trips. If you are away from home, plan to be near a bathroom at each of your scheduled times.  Make sure you urinate just before you leave the house and just before you go to bed.  Do Kegel exercises as directed by your health care provider.  Do not drink alcohol.  Do not use any tobacco products, including cigarettes, chewing tobacco, or electronic cigarettes. If you need help quitting, ask your health care provider.  Make dietary changes as directed by your health care provider. You may need to avoid spicy foods and foods that contain a high amount of potassium.  Limit your drinking of beverages that stimulate urination. These include soda, coffee, and tea.  Keep all follow-up visits as directed by your health care provider. This is important. SEEK MEDICAL CARE IF:  Your symptoms do not get better after treatment.  Your pain and discomfort are getting worse.  You have more frequent urges to urinate.  You have a fever. SEEK IMMEDIATE MEDICAL CARE IF:  You are not able to control your bladder at all.   This information is not intended to replace advice given to you by your health care provider. Make sure you discuss any questions you have with your health care provider.   Document Released: 09/25/2003 Document Revised: 02/14/2014 Document Reviewed: 10/01/2013 Elsevier Interactive Patient Education [employer].

## 2015-12-18 NOTE — MAU Provider Note (Signed)
Chief Complaint: Abdominal Pain   First Provider Initiated Contact with Patient 12/18/15 1811      SUBJECTIVE HPI: [name] is a 60 y.o. [id number] who presents to maternity admissions reporting lower abdominal pain for several years.  She reports the pain is in the center of her lower abdomen and in her right lower pelvis. She also reports occasional urinary incontinence, urge and stress incontinence that is worse when she is having the abdominal pain.  The pain is intermittent, comes and goes at random, and nothing seems to make it better or worse. She reports Tylenol does not help but she cannot take other medications because of her diabetes and other health conditions.  She also has chronic back pain and reports her back pain is worse recently.  Tylenol does not help her back pain either.  She recently saw a new primary care provider and has an appointment with urology later this month.  Previous CT scans and MRIs have been inconclusive in finding causes for her pain.  Her daughter is present and reports that her mother will not take any medications that are prescribed. The pt agrees that she does not want to be treated for chronic pain or see a pain clinic because she wants to know what this is, not just take medicines.  She denies vaginal bleeding, vaginal itching/burning, h/a, dizziness, n/v, or fever/chills.     HPI  Past Medical History:  Diagnosis Date  . Anemia    due to menorrhagia, BL 8-10  . Anxiety   . Bilateral renal cysts 01/15/2009   Qualifier: Diagnosis of  By: Tyrell Antonio MD, [place]    . Chronic abdominal pain   . Chronic back pain   . CKD (chronic kidney disease) stage 3, GFR 30-59 ml/min    baseline creatinine 1.4-1.7  . Colitis   . Congenital heart defect    surgically corrected as a child  . DDD (degenerative disc disease), lumbar   . Depression   . Diabetes mellitus type II, uncontrolled ([place])   . Diabetes mellitus without complication ([place])   . History of  palpitations    evaluated recently 12'15  . Hyperlipidemia   . Hypertension   . IBS (irritable bowel syndrome)   . Ovarian cyst, left   . Postmenopausal bleeding 06/12/2008  . UTI (lower urinary tract infection)   . Vaginal cyst    nabothian and bartholin   Past Surgical History:  Procedure Laterality Date  . CARDIAC SURGERY     to repair congenital defect as a child- 89months old  . COLONOSCOPY WITH PROPOFOL N/A 10/01/2014   Procedure: COLONOSCOPY WITH PROPOFOL;  Surgeon: Arta Silence, MD;  Location: [facility];  Service: Endoscopy;  Laterality: N/A;  . ESOPHAGOGASTRODUODENOSCOPY (EGD) WITH PROPOFOL N/A 10/01/2014   Procedure: ESOPHAGOGASTRODUODENOSCOPY (EGD) WITH PROPOFOL;  Surgeon: Arta Silence, MD;  Location: [facility];  Service: Endoscopy;  Laterality: N/A;   Social History   Social History  . Marital status: Divorced    Spouse name: N/A  . Number of children: 1  . Years of education: 12th grade   Occupational History  . 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 Smoking status: Never Smoker  . Smokeless tobacco: Never Used  . Alcohol use No  . Drug use: No  . Sexual activity: Not on file   Other Topics Concern  . 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  No current facility-administered medications on file prior to encounter.    Current Outpatient Prescriptions on File Prior to Encounter  Medication Sig Dispense Refill  . acetaminophen (TYLENOL) 500 MG tablet Take 1,000 mg by mouth every 6 (six) hours as needed for moderate pain.    Marland Kitchen acetaminophen (TYLENOL) 650 MG CR tablet Take 1,300 mg by mouth 2 (two) times daily.     . enalapril (VASOTEC) 10 MG tablet Take 1 tablet (10 mg total) by mouth daily. 90 tablet 2  . gabapentin (NEURONTIN) 300 MG capsule Take 300 mg by mouth 2 (two) times daily.    . Insulin Glargine (LANTUS SOLOSTAR) 100 UNIT/ML Solostar Pen Take 12 units in the morning  and 12 units in the evening. 15 mL 11  . ciprofloxacin (CIPRO) 500 MG tablet Take 1 tablet (500 mg total) by mouth every 12 (twelve) hours. (Patient not taking: Reported on 12/18/2015) 10 tablet 0  . DULoxetine (CYMBALTA) 30 MG capsule Take 1 capsule (30 mg total) by mouth daily. (Patient not taking: Reported on 11/27/2015) 30 capsule 2  . [DISCONTINUED] pantoprazole (PROTONIX) 20 MG tablet Take 2 tablets (40 mg total) by mouth daily. 30 tablet 1  . [DISCONTINUED] sertraline (ZOLOFT) 100 MG tablet Take 1 tablet (100 mg total) by mouth daily. 30 tablet 2   No Known Allergies  ROS:  Review of Systems  Constitutional: Negative for chills, fatigue and fever.  Respiratory: Negative for shortness of breath.   Cardiovascular: Negative for chest pain.  Gastrointestinal: Positive for abdominal pain. Negative for constipation, nausea and vomiting.  Genitourinary: Positive for dysuria, frequency and pelvic pain. Negative for difficulty urinating, flank pain, vaginal bleeding, vaginal discharge and vaginal pain.  Musculoskeletal: Positive for arthralgias and back pain.  Neurological: Negative for dizziness and headaches.  Psychiatric/Behavioral: Negative.      I have reviewed patient's Past Medical Hx, Surgical Hx, Family Hx, Social Hx, medications and allergies.   Physical Exam  Patient Vitals for the past 24 hrs:  BP Temp Pulse Resp Height Weight  12/18/15 1616 127/79 98.3 F (36.8 C) 97 16 5\' 4"  (1.626 m) 186 lb 0.6 oz (84.4 kg)   Constitutional: Well-developed, well-nourished female in no acute distress.  Cardiovascular: normal rate Respiratory: normal effort GI: Abd soft, non-tender. Pos BS x 4 MS: Extremities nontender, no edema, limited ROM of left leg with knee brace on left knee Neurologic: Alert and oriented x 4.  GU: Neg CVAT.  PELVIC EXAM: Declined by pt    LAB RESULTS Results for orders placed or performed during the hospital encounter of 12/18/15 (from the past 24 hour(s))   Urinalysis, Routine w reflex microscopic (not at [facility])     Status: Abnormal   Collection Time: 12/18/15  4:15 PM  Result Value Ref Range   Color, Urine YELLOW YELLOW   APPearance CLEAR CLEAR   Specific Gravity, Urine 1.010 1.005 - 1.030   pH 5.5 5.0 - 8.0   Glucose, UA 100 (A) NEGATIVE mg/dL   Hgb urine dipstick NEGATIVE NEGATIVE   Bilirubin Urine NEGATIVE NEGATIVE   Ketones, ur NEGATIVE NEGATIVE mg/dL   Protein, ur NEGATIVE NEGATIVE mg/dL   Nitrite NEGATIVE NEGATIVE   Leukocytes, UA SMALL (A) NEGATIVE  Urine microscopic-add on     Status: Abnormal   Collection Time: 12/18/15  4:15 PM  Result Value Ref Range   Squamous Epithelial / LPF 0-5 (A) NONE SEEN   WBC, UA 6-30 0 - 5 WBC/hpf   RBC / HPF 0-5 0 -  5 RBC/hpf   Bacteria, UA FEW (A) NONE SEEN       IMAGING Dg Chest 2 View  Result Date: 11/27/2015 CLINICAL DATA:  60 year old female with chest discomfort for 1 week. EXAM: CHEST  2 VIEW COMPARISON:  05/30/2015 and prior chest radiographs dating back to 2007 FINDINGS: The cardiomediastinal silhouette is unremarkable. Mild peribronchial thickening is unchanged. There is no evidence of focal airspace disease, pulmonary edema, suspicious pulmonary nodule/mass, pleural effusion, or pneumothorax. No acute bony abnormalities are identified. IMPRESSION: No active cardiopulmonary disease. Electronically Signed   By: Margarette Canada M.D.   On: 11/27/2015 16:41    MAU Management/MDM: Ordered labs and reviewed results. Pt reports being upset today at having to wait and indicates that no one is doing enough to help her. She also declined her labs that were ordered because HIV was ordered and she did not want this, as she is not sexually active.  She desires to leave as soon as CNM entered the room. Discussed with pt reasons for labwork and options to manage her symptoms and follow up outpatient.  Pt pain is chronic, although intermittent, and pt believes it is related to her bladder.  She has  declined to take antispasmotic medications before but discussed with pt recommendation to try medication.  Ditropan Rx sent to pharmacy.  Pt to f/u with primary care and urology as scheduled.  If pt desires outpatient Gyn care, may contact [facility] office, which is near her home.  Pt stable at time of discharge.  ASSESSMENT 1. Stress incontinence of urine   2. Urge incontinence of urine   3. Chronic bladder pain     PLAN Discharge home   Medication List    STOP taking these medications   ciprofloxacin 500 MG tablet Commonly known as:  CIPRO   DULoxetine 30 MG capsule Commonly known as:  CYMBALTA     TAKE these medications   acetaminophen 650 MG CR tablet Commonly known as:  TYLENOL Take 1,300 mg by mouth 2 (two) times daily.   acetaminophen 500 MG tablet Commonly known as:  TYLENOL Take 1,000 mg by mouth every 6 (six) hours as needed for moderate pain.   aspirin 81 MG chewable tablet Chew 81 mg by mouth daily.   enalapril 10 MG tablet Commonly known as:  VASOTEC Take 1 tablet (10 mg total) by mouth daily.   gabapentin 300 MG capsule Commonly known as:  NEURONTIN Take 300 mg by mouth 2 (two) times daily.   Insulin Glargine 100 UNIT/ML Solostar Pen Commonly known as:  LANTUS SOLOSTAR Take 12 units in the morning and 12 units in the evening.   oxybutynin 10 MG 24 hr tablet Commonly known as:  DITROPAN XL Take 1 tablet (10 mg total) by mouth at bedtime.   pioglitazone 15 MG tablet Commonly known as:  ACTOS Take 15 mg by mouth daily.      Follow-up Lane for [facility] Follow up.   Specialty:  Obstetrics and Gynecology Contact information: [place] Certified Nurse-Midwife 12/18/2015  7:23 PM

## 2015-12-18 NOTE — MAU Provider Note (Signed)
History    Chief Complaint  Patient presents with  . Abdominal Pain   [name] is a 60 y.o. Female who presents with chronic intermittent RLQ pain.  Patient describes the pain as an intermittent ache that is made worse with movement and better by Tylenol.  The pain is non-radiating.  She has been seen in the ED multiple times for this complaint and recently had an abdominal CT, MRI, and pelvic US which didn't reveal any obvious cause of her pain.  In addition she has had a normal EGD and colonoscopy in the past 2 years.  Patient has been treated with 2 different antibiotics over the past month for UTI but claims her cultures today came back negative and no longer is taking them.  Denies nausea, vomiting, and constipation.  Patient admits to increase in BM frequency but say her stools aren't watery.  Additionally patient is complaining of urinary leakage at night.  She is being followed by a urologist for this but recently refused a medication they suggested her to take.  She has another appointment scheduled with Urology on 11/27.  She denies urinary frequency and urgency.   Patient is unsure of any vaginal discharge.  She has noticed grey coloring to her underwear in the morning after she leaks urine but is unsure if this is from the urine or discharge.  No vaginal bleeding.  No recent new sexual partners.  Of note, she has a history of lower back pain with lumbar spondylosis and severe L1-2 degenerative disc changes.  No other complaints at this time.     OB History    Gravida Para Term Preterm AB Living   3 1 1   2 1    SAB TAB Ectopic Multiple Live Births   2       1       Past Medical History:  Diagnosis Date  . Anemia    due to menorrhagia, BL 8-10  . Anxiety   . Bilateral renal cysts 01/15/2009   Qualifier: Diagnosis of  By: Tyrell Antonio MD, [place]    . Chronic abdominal pain   . Chronic back pain   . CKD (chronic kidney disease) stage 3, GFR 30-59 ml/min    baseline creatinine  1.4-1.7  . Colitis   . Congenital heart defect    surgically corrected as a child  . DDD (degenerative disc disease), lumbar   . Depression   . Diabetes mellitus type II, uncontrolled ([place])   . Diabetes mellitus without complication ([place])   . History of palpitations    evaluated recently 12'15  . Hyperlipidemia   . Hypertension   . IBS (irritable bowel syndrome)   . Ovarian cyst, left   . Postmenopausal bleeding 06/12/2008  . UTI (lower urinary tract infection)   . Vaginal cyst    nabothian and bartholin    Past Surgical History:  Procedure Laterality Date  . CARDIAC SURGERY     to repair congenital defect as a child- 106months old  . COLONOSCOPY WITH PROPOFOL N/A 10/01/2014   Procedure: COLONOSCOPY WITH PROPOFOL;  Surgeon: Arta Silence, MD;  Location: [facility];  Service: Endoscopy;  Laterality: N/A;  . ESOPHAGOGASTRODUODENOSCOPY (EGD) WITH PROPOFOL N/A 10/01/2014   Procedure: ESOPHAGOGASTRODUODENOSCOPY (EGD) WITH PROPOFOL;  Surgeon: Arta Silence, MD;  Location: [facility];  Service: Endoscopy;  Laterality: N/A;    Family History  Problem Relation Age of Onset  . Stroke Father   . Heart attack Father  Had MI in his 53s  . Stomach cancer Paternal Grandmother   . Diabetes Maternal Grandmother   . Cerebral palsy Daughter   . Anesthesia problems Neg Hx   . Hypotension Neg Hx   . Malignant hyperthermia Neg Hx   . Pseudochol deficiency Neg Hx     Social History  Substance Use Topics  . Smoking status: Never Smoker  . Smokeless tobacco: Never Used  . Alcohol use No    Allergies: No Known Allergies  Prescriptions Prior to Admission  Medication Sig Dispense Refill Last Dose  . acetaminophen (TYLENOL) 500 MG tablet Take 1,000 mg by mouth every 6 (six) hours as needed for moderate pain.   Past Week at Unknown time  . acetaminophen (TYLENOL) 650 MG CR tablet Take 1,300 mg by mouth 2 (two) times daily.    12/18/2015 at Unknown time  . aspirin 81 MG chewable tablet  Chew 81 mg by mouth daily.   12/18/2015 at Unknown time  . enalapril (VASOTEC) 10 MG tablet Take 1 tablet (10 mg total) by mouth daily. 90 tablet 2 12/18/2015 at Unknown time  . gabapentin (NEURONTIN) 300 MG capsule Take 300 mg by mouth 2 (two) times daily.   12/18/2015 at Unknown time  . Insulin Glargine (LANTUS SOLOSTAR) 100 UNIT/ML Solostar Pen Take 12 units in the morning and 12 units in the evening. 15 mL 11 12/18/2015 at Unknown time  . pioglitazone (ACTOS) 15 MG tablet Take 15 mg by mouth daily.   12/18/2015 at Unknown time  . ciprofloxacin (CIPRO) 500 MG tablet Take 1 tablet (500 mg total) by mouth every 12 (twelve) hours. (Patient not taking: Reported on 12/18/2015) 10 tablet 0 Not Taking at Unknown time  . DULoxetine (CYMBALTA) 30 MG capsule Take 1 capsule (30 mg total) by mouth daily. (Patient not taking: Reported on 11/27/2015) 30 capsule 2 Completed Course at Unknown time    Review of Systems  Constitutional: Negative for chills and fever.  HENT: Negative.   Respiratory: Negative for shortness of breath.   Cardiovascular: Negative.   Gastrointestinal: Positive for abdominal pain. Negative for blood in stool, constipation, diarrhea, nausea and vomiting.  Genitourinary: Negative for dysuria, frequency, hematuria and urgency.       Incontinence, cloudy urine    Musculoskeletal: Positive for back pain and joint pain (chronic left knee pain).  Skin: Negative.   Neurological: Negative.   Psychiatric/Behavioral: Negative.    Physical Exam Blood pressure 127/79, pulse 97, temperature 98.3 F (36.8 C), resp. rate 16, height 5\' 4"  (1.626 m), weight 84.4 kg (186 lb 0.6 oz). Physical Exam  Nursing note and vitals reviewed. Constitutional: She is oriented to person, place, and time. She appears well-developed and well-nourished. No distress.  HENT:  Head: Normocephalic and atraumatic.  Neck: Normal range of motion. Neck supple.  Cardiovascular: Normal rate and regular rhythm.   No  murmur heard. Respiratory: Effort normal. No respiratory distress.  GI: Soft. Bowel sounds are normal. She exhibits no distension. There is no tenderness. There is no rebound and no guarding.  Musculoskeletal: Normal range of motion.  Neurological: She is alert and oriented to person, place, and time.  Skin: Skin is warm and dry.  Psychiatric: She has a normal mood and affect. Her behavior is normal.    Results for orders placed or performed during the hospital encounter of 12/18/15 (from the past 24 hour(s))  Urinalysis, Routine w reflex microscopic (not at [facility])     Status: Abnormal   Collection Time:  12/18/15  4:15 PM  Result Value Ref Range   Color, Urine YELLOW YELLOW   APPearance CLEAR CLEAR   Specific Gravity, Urine 1.010 1.005 - 1.030   pH 5.5 5.0 - 8.0   Glucose, UA 100 (A) NEGATIVE mg/dL   Hgb urine dipstick NEGATIVE NEGATIVE   Bilirubin Urine NEGATIVE NEGATIVE   Ketones, ur NEGATIVE NEGATIVE mg/dL   Protein, ur NEGATIVE NEGATIVE mg/dL   Nitrite NEGATIVE NEGATIVE   Leukocytes, UA SMALL (A) NEGATIVE  Urine microscopic-add on     Status: Abnormal   Collection Time: 12/18/15  4:15 PM  Result Value Ref Range   Squamous Epithelial / LPF 0-5 (A) NONE SEEN   WBC, UA 6-30 0 - 5 WBC/hpf   RBC / HPF 0-5 0 - 5 RBC/hpf   Bacteria, UA FEW (A) NONE SEEN     MAU Course Procedures  MDM  Assessment  Plan

## 2015-12-18 NOTE — MAU Note (Signed)
Patient presents with right lower abdominal pain x months? Will go away and then come back, history of ovarian cysts.

## 2015-12-19 ENCOUNTER — Encounter (HOSPITAL_COMMUNITY): Payer: Self-pay | Admitting: Emergency Medicine

## 2015-12-19 ENCOUNTER — Ambulatory Visit (HOSPITAL_COMMUNITY)
Admission: EM | Admit: 2015-12-19 | Discharge: 2015-12-19 | Disposition: A | Discharge status: Home / Self Care | Payer: Medicaid Other | Attending: Family Medicine | Admitting: Family Medicine

## 2015-12-19 ENCOUNTER — Other Ambulatory Visit: Payer: Self-pay | Admitting: Obstetrics and Gynecology

## 2015-12-19 DIAGNOSIS — G8929 Other chronic pain: Secondary | ICD-10-CM

## 2015-12-19 DIAGNOSIS — R350 Frequency of micturition: Secondary | ICD-10-CM | POA: Diagnosis not present

## 2015-12-19 DIAGNOSIS — M25562 Pain in left knee: Secondary | ICD-10-CM | POA: Diagnosis not present

## 2015-12-19 DIAGNOSIS — R81 Glycosuria: Secondary | ICD-10-CM

## 2015-12-19 LAB — POCT URINALYSIS DIP (DEVICE)
Bilirubin Urine: NEGATIVE
Glucose, UA: 500 mg/dL — AB
KETONES UR: NEGATIVE mg/dL
Nitrite: NEGATIVE
PH: 5.5 (ref 5.0–8.0)
PROTEIN: NEGATIVE mg/dL
SPECIFIC GRAVITY, URINE: 1.01 (ref 1.005–1.030)
Urobilinogen, UA: 0.2 mg/dL (ref 0.0–1.0)

## 2015-12-19 MED ORDER — DICLOFENAC SODIUM 1 % TD GEL
2.0000 g | Freq: Four times a day (QID) | TRANSDERMAL | 0 refills | Status: AC
Start: 2015-12-19 — End: 2016-01-02

## 2015-12-19 MED ORDER — OXYBUTYNIN CHLORIDE ER 10 MG PO TB24: 10.0000 mg | ORAL_TABLET | Freq: Every day | ORAL | 2 refills | Status: AC

## 2015-12-19 MED ORDER — OXYBUTYNIN CHLORIDE ER 10 MG PO TB24: 10.0000 mg | ORAL_TABLET | Freq: Every day | ORAL | 2 refills | Status: DC

## 2015-12-19 NOTE — Discharge Instructions (Signed)
It was nice seeing you. Your urine frequency might be due to high glucose. Please ensure good compliance with your diabetic regimen. You had urine culture done at [facility] hospital yesterday. Result is still pending. Let us wait to see result of that test prior to treating UTI. Keep your self well hydrated. F/U with your urologist as planned.

## 2015-12-19 NOTE — ED Notes (Signed)
[facility] and why we would not be treating her today with antibiotics. Patient states that cream we gave her today is to expensive with her insurance. Encouraged patient increase her fluid intake.

## 2015-12-19 NOTE — ED Provider Notes (Signed)
[facility]    CSN: [id number] Arrival date & time: 12/19/15  1653     History   Chief Complaint Chief Complaint  Patient presents with  . Urinary Tract Infection    HPI [name] is a 60 y.o. female.   The history is provided by the patient. No language interpreter was used.  increase urine frequency for 6 months denies any dysuria. Patient has been seen by multiple providers in the last few weeks. She was seen at the [facility] yesterday for similar presentation and was sent home with no A/B. She is concern she has UTI. She wakes up at night to urinate multiple times. Denies abdominal pain, no change in urine color. She has appointment with urology on 01/04/16.   Also c/o left knee pain for 1 yr, worsening, associated with swelling. She was told in the past she has arthritis. She uses Tylenol as needed for pain with no improvement. She is asking for pain meds. Past Medical History:  Diagnosis Date  . Anemia    due to menorrhagia, BL 8-10  . Anxiety   . Bilateral renal cysts 01/15/2009   Qualifier: Diagnosis of  By: Tyrell Antonio MD, Bel[place]    . Chronic abdominal pain   . Chronic back pain   . CKD (chronic kidney disease) stage 3, GFR 30-59 ml/min    baseline creatinine 1.4-1.7  . Colitis   . Congenital heart defect    surgically corrected as a child  . DDD (degenerative disc disease), lumbar   . Depression   . Diabetes mellitus type II, uncontrolled ([place])   . Diabetes mellitus without complication ([place])   . History of palpitations    evaluated recently 12'15  . Hyperlipidemia   . Hypertension   . IBS (irritable bowel syndrome)   . Ovarian cyst, left   . Postmenopausal bleeding 06/12/2008  . UTI (lower urinary tract infection)   . Vaginal cyst    nabothian and bartholin    Patient Active Problem List   Diagnosis Date Noted  . Right hip pain 11/30/2015  . Weight gain 10/19/2015  . Osteoarthritis of left knee 06/29/2015  . Bicornuate uterus 06/22/2015  .  RLQ abdominal pain 06/12/2015  . Hyperkalemia 04/27/2015  . Recurrent UTI (urinary tract infection) 12/24/2014  . Hyponatremia 09/12/2014  . GERD (gastroesophageal reflux disease) 01/06/2014  . Urge incontinence 11/26/2013  . Chronic pain 09/13/2012  . Preventative health care 11/03/2010  . Bilateral renal cysts 01/15/2009  . Anxiety state 06/12/2008  . CKD (chronic kidney disease) stage 3, GFR 30-59 ml/min 06/12/2008  . Diabetes mellitus with stage 3 chronic kidney disease ([place]) 12/28/2005  . Hyperlipidemia associated with type 2 diabetes mellitus ([place]) 12/28/2005  . Hypertension associated with diabetes ([place]) 12/28/2005    Past Surgical History:  Procedure Laterality Date  . CARDIAC SURGERY     to repair congenital defect as a child- 82months old  . COLONOSCOPY WITH PROPOFOL N/A 10/01/2014   Procedure: COLONOSCOPY WITH PROPOFOL;  Surgeon: Arta Silence, MD;  Location: [facility];  Service: Endoscopy;  Laterality: N/A;  . ESOPHAGOGASTRODUODENOSCOPY (EGD) WITH PROPOFOL N/A 10/01/2014   Procedure: ESOPHAGOGASTRODUODENOSCOPY (EGD) WITH PROPOFOL;  Surgeon: Arta Silence, MD;  [facility];  Service: Endoscopy;  Laterality: N/A;    OB History    Gravida Para Term Preterm AB Living   3 1 1   2 1    SAB TAB Ectopic Multiple Live Births   2       1  Home Medications    Prior to Admission medications   Medication Sig Start Date End Date Taking? Authorizing Provider  acetaminophen (TYLENOL) 500 MG tablet Take 1,000 mg by mouth every 6 (six) hours as needed for moderate pain.   Yes Historical Provider, MD  aspirin 81 MG chewable tablet Chew 81 mg by mouth daily.   Yes Historical Provider, MD  enalapril (VASOTEC) 10 MG tablet Take 1 tablet (10 mg total) by mouth daily. 10/19/15  Yes Asencion Partridge, MD  gabapentin (NEURONTIN) 300 MG capsule Take 300 mg by mouth 2 (two) times daily.   Yes Historical Provider, MD  Insulin Glargine (LANTUS SOLOSTAR) 100 UNIT/ML Solostar Pen  Take 12 units in the morning and 12 units in the evening. 11/03/15  Yes Lorella Nimrod, MD  pioglitazone (ACTOS) 15 MG tablet Take 15 mg by mouth daily. 12/08/15 12/07/16 Yes Historical Provider, MD  acetaminophen (TYLENOL) 650 MG CR tablet Take 1,300 mg by mouth 2 (two) times daily.     Historical Provider, MD  oxybutynin (DITROPAN XL) 10 MG 24 hr tablet Take 1 tablet (10 mg total) by mouth at bedtime. 12/19/15   Lezlie Lye, NP  oxybutynin (DITROPAN XL) 10 MG 24 hr tablet Take 1 tablet (10 mg total) by mouth at bedtime. 12/19/15   Lezlie Lye, NP    Family History Family History  Problem Relation Age of Onset  . Stroke Father   . Heart attack Father     Had MI in his 37s  . Stomach cancer Paternal Grandmother   . Diabetes Maternal Grandmother   . Cerebral palsy Daughter   . Anesthesia problems Neg Hx   . Hypotension Neg Hx   . Malignant hyperthermia Neg Hx   . Pseudochol deficiency Neg Hx     Social History Social History  Substance Use Topics  . Smoking status: Never Smoker  . Smokeless tobacco: Never Used  . Alcohol use No     Allergies   Patient has no known allergies.   Review of Systems Review of Systems  Respiratory: Negative.   Cardiovascular: Negative.   Gastrointestinal: Negative.   Genitourinary: Positive for frequency. Negative for difficulty urinating, dysuria, flank pain, hematuria, vaginal discharge and vaginal pain.  Musculoskeletal: Positive for arthralgias and joint swelling.  All other systems reviewed and are negative.    Physical Exam Triage Vital Signs ED Triage Vitals  Enc Vitals Group     BP 12/19/15 1743 141/65     Pulse Rate 12/19/15 1743 86     Resp 12/19/15 1743 20     Temp 12/19/15 1743 98.5 F (36.9 C)     Temp Source 12/19/15 1743 Oral     SpO2 12/19/15 1743 99 %     Weight --      Height --      Head Circumference --      Peak Flow --      Pain Score 12/19/15 1745 7     Pain Loc --      Pain Edu? --      Excl. in  [place]? --    No data found.   Updated Vital Signs BP 141/65 (BP Location: Left Arm)   Pulse 86   Temp 98.5 F (36.9 C) (Oral)   Resp 20   SpO2 99%   Visual Acuity Right Eye Distance:   Left Eye Distance:   Bilateral Distance:    Right Eye Near:   Left Eye Near:    Bilateral  Near:     Physical Exam  Constitutional: She appears well-developed and well-nourished. No distress.  Cardiovascular: Normal rate and regular rhythm.   Murmur heard. Pulmonary/Chest: She is in respiratory distress. She has wheezes.  Abdominal: Soft. Bowel sounds are normal. She exhibits no distension and no mass. There is no tenderness. There is no rebound and no guarding. No hernia.  Musculoskeletal:       Left knee: She exhibits decreased range of motion and swelling.  Nursing note and vitals reviewed.    UC Treatments / Results  Labs (all labs ordered are listed, but only abnormal results are displayed) Labs Reviewed  POCT URINALYSIS DIP (DEVICE) - Abnormal; Notable for the following:       Result Value   Glucose, UA 500 (*)    Hgb urine dipstick TRACE (*)    Leukocytes, UA SMALL (*)    All other components within normal limits    EKG  EKG Interpretation None       Radiology No results found.  Procedures Procedures (including critical care time)  Medications Ordered in UC Medications - No data to display   Initial Impression / Assessment and Plan / UC Course  I have reviewed the triage vital signs and the nursing notes.  Pertinent labs & imaging results that were available during my care of the patient were reviewed by me and considered in my medical decision making (see chart for details).  Clinical Course    Urinalysis    Component Value Date/Time   COLORURINE YELLOW 12/18/2015 [place] 12/18/2015 1615   LABSPEC 1.010 12/19/2015 1757   PHURINE 5.5 12/19/2015 1757   GLUCOSEU 500 (A) 12/19/2015 1757   GLUCOSEU NEG mg/dL 12/24/2008 2022   HGBUR TRACE  (A) 12/19/2015 1757   HGBUR moderate 06/16/2008 1340   BILIRUBINUR NEGATIVE 12/19/2015 1757   BILIRUBINUR Negative 11/14/2013 [place] 12/19/2015 1757   PROTEINUR NEGATIVE 12/19/2015 1757   UROBILINOGEN 0.2 12/19/2015 1757   NITRITE NEGATIVE 12/19/2015 1757   LEUKOCYTESUR SMALL (A) 12/19/2015 1757     Urine Frequency: UA above not suggestive of UTI. She got urine culture done at [facility] yesterday which is still pending. As discussed with her, we will wait for result prior to treating her for UTI. She is to expect a call from MAU staff with result or she can call for her result. F/U with urologist as planned for possible cystitis. Keep well hydrated. As discussed with her her increase urine frequency might be due to DM. She has glycosuria today. I advised compliance with DM regimen and follow up with PCP soon for medication adjustment. She verbalized understanding.  Knee pain likely arthritis. Voltaren gel prescribed for 7-14 days. F/U with PCP soon for further management.  Final Clinical Impressions(s) / UC Diagnoses   Final diagnoses:  None  Urine frequency  Chronic pain of left knee  Glycosuria  New Prescriptions New Prescriptions   No medications on file     Kinnie Feil, MD 12/19/15 1835

## 2015-12-19 NOTE — ED Triage Notes (Signed)
Here for UTI sx onset 2-3 weeks associated w/urinary frequency and urgency   Seen at [facility] for similar sx.   Denies dysuria, fevers  A&O x4.... NAD

## 2015-12-20 LAB — URINE CULTURE

## 2015-12-23 ENCOUNTER — Encounter (HOSPITAL_COMMUNITY): Payer: Self-pay | Admitting: Emergency Medicine

## 2015-12-23 ENCOUNTER — Ambulatory Visit (HOSPITAL_COMMUNITY)
Admission: EM | Admit: 2015-12-23 | Discharge: 2015-12-23 | Disposition: A | Discharge status: Home / Self Care | Payer: Medicaid Other | Attending: Emergency Medicine | Admitting: Emergency Medicine

## 2015-12-23 DIAGNOSIS — J40 Bronchitis, not specified as acute or chronic: Secondary | ICD-10-CM

## 2015-12-23 MED ORDER — HYDROCOD POLST-CPM POLST ER 10-8 MG/5ML PO SUER: 5.0000 mL | Freq: Two times a day (BID) | ORAL | 0 refills | Status: AC | PRN

## 2015-12-23 MED ORDER — PREDNISONE 50 MG PO TABS
ORAL_TABLET | ORAL | 0 refills | Status: AC
Start: 2015-12-23 — End: ?

## 2015-12-23 MED ORDER — AZITHROMYCIN 250 MG PO TABS
ORAL_TABLET | ORAL | 0 refills | Status: AC
Start: 2015-12-23 — End: ?

## 2015-12-23 NOTE — Discharge Instructions (Signed)
You have bronchitis. Take azithromycin and prednisone as prescribed. Use tussionex as needed for cough. You should see improvement in the next 3-5 days. If you develop fevers, difficulty breathing, or are just not getting better, please come back or go to the emergency room.  Put ice on your knee. Heat on your back. Try and get a good nights rest tonight. Take it easy tomorrow.  Follow-up as needed.

## 2015-12-23 NOTE — ED Provider Notes (Signed)
[place]    CSN: [id number] Arrival date & time: 12/23/15  1723     History   Chief Complaint Chief Complaint  Patient presents with  . Cough    HPI [name] is a 60 y.o. female.   HPI  She is a 60 year old woman here for evaluation of cough. She reports a three-day history of cough and chest congestion. She reports minimal nasal symptoms. Her daughter has heard some wheezing. No shortness of breath. She reports subjective fevers, but no documented temperature.    She also reports back pain and knee pain.  Past Medical History:  Diagnosis Date  . Anemia    due to menorrhagia, BL 8-10  . Anxiety   . Bilateral renal cysts 01/15/2009   Qualifier: Diagnosis of  By: Tyrell Antonio MD, Bel[place]    . Chronic abdominal pain   . Chronic back pain   . CKD (chronic kidney disease) stage 3, GFR 30-59 ml/min    baseline creatinine 1.4-1.7  . Colitis   . Congenital heart defect    surgically corrected as a child  . DDD (degenerative disc disease), lumbar   . Depression   . Diabetes mellitus type II, uncontrolled ([place])   . Diabetes mellitus without complication ([place])   . History of palpitations    evaluated recently 12'15  . Hyperlipidemia   . Hypertension   . IBS (irritable bowel syndrome)   . Ovarian cyst, left   . Postmenopausal bleeding 06/12/2008  . UTI (lower urinary tract infection)   . Vaginal cyst    nabothian and bartholin    Patient Active Problem List   Diagnosis Date Noted  . Right hip pain 11/30/2015  . Weight gain 10/19/2015  . Osteoarthritis of left knee 06/29/2015  . Bicornuate uterus 06/22/2015  . RLQ abdominal pain 06/12/2015  . Hyperkalemia 04/27/2015  . Recurrent UTI (urinary tract infection) 12/24/2014  . Hyponatremia 09/12/2014  . GERD (gastroesophageal reflux disease) 01/06/2014  . Urge incontinence 11/26/2013  . Chronic pain 09/13/2012  . Preventative health care 11/03/2010  . Bilateral renal cysts 01/15/2009  . Anxiety state  06/12/2008  . CKD (chronic kidney disease) stage 3, GFR 30-59 ml/min 06/12/2008  . Diabetes mellitus with stage 3 chronic kidney disease ([place]) 12/28/2005  . Hyperlipidemia associated with type 2 diabetes mellitus ([place]) 12/28/2005  . Hypertension associated with diabetes ([place]) 12/28/2005    Past Surgical History:  Procedure Laterality Date  . CARDIAC SURGERY     to repair congenital defect as a child- 65months old  . COLONOSCOPY WITH PROPOFOL N/A 10/01/2014   Procedure: COLONOSCOPY WITH PROPOFOL;  Surgeon: Arta Silence, MD;  [facility];  Service: Endoscopy;  Laterality: N/A;  . ESOPHAGOGASTRODUODENOSCOPY (EGD) WITH PROPOFOL N/A 10/01/2014   Procedure: ESOPHAGOGASTRODUODENOSCOPY (EGD) WITH PROPOFOL;  Surgeon: Arta Silence, MD;  [facility];  Service: Endoscopy;  Laterality: N/A;    OB History    Gravida Para Term Preterm AB Living   3 1 1   2 1    SAB TAB Ectopic Multiple Live Births   2       1       Home Medications    Prior to Admission medications   Medication Sig Start Date End Date Taking? Authorizing Provider  acetaminophen (TYLENOL) 500 MG tablet Take 1,000 mg by mouth every 6 (six) hours as needed for moderate pain.    Historical Provider, MD  acetaminophen (TYLENOL) 650 MG CR tablet Take 1,300 mg by mouth 2 (two) times  daily.     Historical Provider, MD  aspirin 81 MG chewable tablet Chew 81 mg by mouth daily.    Historical Provider, MD  azithromycin (ZITHROMAX Z-PAK) 250 MG tablet Take 2 pills today, then 1 pill daily until gone. 12/23/15   Melony Overly, MD  chlorpheniramine-HYDROcodone (TUSSIONEX PENNKINETIC ER) 10-8 MG/5ML SUER Take 5 mLs by mouth every 12 (twelve) hours as needed for cough. 12/23/15   Melony Overly, MD  diclofenac sodium (VOLTAREN) 1 % GEL Apply 2 g topically 4 (four) times daily. 12/19/15 01/02/16  Kinnie Feil, MD  enalapril (VASOTEC) 10 MG tablet Take 1 tablet (10 mg total) by mouth daily. 10/19/15   Asencion Partridge, MD    gabapentin (NEURONTIN) 300 MG capsule Take 300 mg by mouth 2 (two) times daily.    Historical Provider, MD  Insulin Glargine (LANTUS SOLOSTAR) 100 UNIT/ML Solostar Pen Take 12 units in the morning and 12 units in the evening. 11/03/15   Lorella Nimrod, MD  oxybutynin (DITROPAN XL) 10 MG 24 hr tablet Take 1 tablet (10 mg total) by mouth at bedtime. 12/19/15   Lezlie Lye, NP  oxybutynin (DITROPAN XL) 10 MG 24 hr tablet Take 1 tablet (10 mg total) by mouth at bedtime. 12/19/15   Lezlie Lye, NP  pioglitazone (ACTOS) 15 MG tablet Take 15 mg by mouth daily. 12/08/15 12/07/16  Historical Provider, MD  predniSONE (DELTASONE) 50 MG tablet Take 1 pill daily for 5 days. 12/23/15   Melony Overly, MD    Family History Family History  Problem Relation Age of Onset  . Stroke Father   . Heart attack Father     Had MI in his 30s  . Stomach cancer Paternal Grandmother   . Diabetes Maternal Grandmother   . Cerebral palsy Daughter   . Anesthesia problems Neg Hx   . Hypotension Neg Hx   . Malignant hyperthermia Neg Hx   . Pseudochol deficiency Neg Hx     Social History Social History  Substance Use Topics  . Smoking status: Never Smoker  . Smokeless tobacco: Never Used  . Alcohol use No     Allergies   Patient has no known allergies.   Review of Systems Review of Systems As in history of present illness  Physical Exam Triage Vital Signs ED Triage Vitals  Enc Vitals Group     BP 12/23/15 1739 152/81     Pulse Rate 12/23/15 1739 99     Resp 12/23/15 1739 20     Temp 12/23/15 1739 99.1 F (37.3 C)     Temp Source 12/23/15 1739 Oral     SpO2 12/23/15 1739 98 %     Weight --      Height --      Head Circumference --      Peak Flow --      Pain Score 12/23/15 1743 3     Pain Loc --      Pain Edu? --      Excl. in [place]? --    No data found.   Updated Vital Signs BP 152/81 (BP Location: Left Arm)   Pulse 99   Temp 99.1 F (37.3 C) (Oral)   Resp 20   SpO2 98%   Visual  Acuity Right Eye Distance:   Left Eye Distance:   Bilateral Distance:    Right Eye Near:   Left Eye Near:    Bilateral Near:     Physical Exam  Constitutional: She is oriented to person, place, and time. She appears well-developed and well-nourished. No distress.  Became tearful at the end of the visit. Patient states she is very tired and has been doing a lot for others recently.  HENT:  Nose: Nose normal.  Neck: Neck supple.  Cardiovascular: Normal rate, regular rhythm and normal heart sounds.   No murmur heard. Pulmonary/Chest: Effort normal and breath sounds normal. No respiratory distress. She has no wheezes. She has no rales. She exhibits no tenderness.  Lymphadenopathy:    She has no cervical adenopathy.  Neurological: She is alert and oriented to person, place, and time.     UC Treatments / Results  Labs (all labs ordered are listed, but only abnormal results are displayed) Labs Reviewed - No data to display  EKG  EKG Interpretation None       Radiology No results found.  Procedures Procedures (including critical care time)  Medications Ordered in UC Medications - No data to display   Initial Impression / Assessment and Plan / UC Course  I have reviewed the triage vital signs and the nursing notes.  Pertinent labs & imaging results that were available during my care of the patient were reviewed by me and considered in my medical decision making (see chart for details).  Clinical Course     Treatment with azithromycin and prednisone. Tussionex to help with cough and sleep. Follow-up as needed.  Final Clinical Impressions(s) / UC Diagnoses   Final diagnoses:  Bronchitis    New Prescriptions New Prescriptions   AZITHROMYCIN (ZITHROMAX Z-PAK) 250 MG TABLET    Take 2 pills today, then 1 pill daily until gone.   CHLORPHENIRAMINE-HYDROCODONE (TUSSIONEX PENNKINETIC ER) 10-8 MG/5ML SUER    Take 5 mLs by mouth every 12 (twelve) hours as needed for  cough.   PREDNISONE (DELTASONE) 50 MG TABLET    Take 1 pill daily for 5 days.     Melony Overly, MD 12/23/15 [phone]

## 2015-12-23 NOTE — ED Triage Notes (Signed)
The patient presented to the [facility] with a complaint of a cough. The patient reported that she has had a cough x 3 days with congestion.

## 2015-12-28 ENCOUNTER — Ambulatory Visit (INDEPENDENT_AMBULATORY_CARE_PROVIDER_SITE_OTHER): Payer: Medicaid Other | Admitting: Sports Medicine

## 2015-12-28 ENCOUNTER — Encounter (INDEPENDENT_AMBULATORY_CARE_PROVIDER_SITE_OTHER): Payer: Self-pay | Admitting: Sports Medicine

## 2015-12-28 VITALS — BP 146/79 | HR 74 | Ht 63.0 in | Wt 180.0 lb

## 2015-12-28 DIAGNOSIS — M1712 Unilateral primary osteoarthritis, left knee: Secondary | ICD-10-CM | POA: Diagnosis not present

## 2015-12-28 DIAGNOSIS — G894 Chronic pain syndrome: Secondary | ICD-10-CM | POA: Diagnosis not present

## 2015-12-28 DIAGNOSIS — N183 Chronic kidney disease, stage 3 unspecified: Secondary | ICD-10-CM

## 2015-12-28 MED ORDER — GABAPENTIN 300 MG PO CAPS: 300.0000 mg | ORAL_CAPSULE | Freq: Three times a day (TID) | ORAL | 2 refills | Status: DC

## 2015-12-28 NOTE — Progress Notes (Signed)
[name] - 60 y.o. female MRN [id number]  Date of birth: 03/16/1955  Office Visit Note: Visit Date: 12/28/2015 PCP: Albin Felling, MD Referred by: Juliet Rude, MD  Subjective: Chief Complaint  Patient presents with  . Lower Back - Pain    Here for medication refill of Gabapentin. Low back pain.   HPI: Patient reports persistent low back & left leg pain with underlying known lumbar spondylosis with radiculitis & left knee osteoarthritis. She has responded well to gabapentin in the past. She has recently obtained insurance & is here today to reestablish care. Previously was responding well to gabapentin & is requesting refills on this today. She is not interested in repeat injection of the knee or in further diagnostic evaluation. She is not interested in total knee arthroplasty at this time. She denies any fevers, chills reviewed her activity level has decreased over the past several months due to persistent knee & leg pain. She denies any other changes in her functional status however. No significant nighttime awakenings due to this. Worsening diabetes. ROS:. Otherwise per HPI.   Clinical History: No specialty comments available.  She reports that she has never smoked. She has never used smokeless tobacco.   Recent Labs  03/24/15 1332 06/12/15 1403 10/19/15 1654  HGBA1C 8.1 9.6 9.3    Assessment & Plan: Visit Diagnoses:  1. CKD (chronic kidney disease) stage 3, GFR 30-59 ml/min   2. Primary osteoarthritis of left knee   3. Chronic pain syndrome    Plan: I'm happy to refill her gabapentin today. She has done well with this in the past & seemingly responds to this. We discussed her knee is likely a large contributing factor to her underlying leg pain & if she is interested further injections and/or total knee arthroplasty could be considered but she would like to hold off on this at this time.  Given her worsening diabetes & CKD stage III she is not a great candidate for  chronic NSAID use. I did discuss that I'm changing locations & that if she is responding well to the gabapentin that she may ask her primary care physician to fill this for her. Otherwise following up with any of my partners to discuss consideration of total knee arthroplasty is recommended. Follow-up: Return if symptoms worsen or fail to improve.  Meds:  Meds ordered this encounter  Medications  . gabapentin (NEURONTIN) 300 MG capsule    Sig: Take 1 capsule (300 mg total) by mouth 3 (three) times daily.    Dispense:  90 capsule    Refill:  2   Procedures: No notes on file   Objective:  VS:  HT:5\' 3"  (160 cm)   WT:180 lb (81.6 kg)  BMI:32    BP:(!) 146/79  HR:74bpm  TEMP: ( )  RESP:  Physical Exam: Adult female. No acute distress. Alert & appropriate. Back & left leg: Some pain with straight leg raise today. Lower extremity sensation intact. Good internal & external rotation of the left hip. She has moderate degenerative bossing of the left knee. Range of motion: 3 to 105. She is stable to varus & valgus strain as well as anterior posterior drawer. Imaging: No results found.   Past Medical/Family/Surgical/Social History: Medications & Allergies reviewed per EMR Patient Active Problem List   Diagnosis Date Noted  . Right hip pain 11/30/2015  . Weight gain 10/19/2015  . Osteoarthritis of left knee 06/29/2015  . Bicornuate uterus 06/22/2015  . RLQ abdominal pain 06/12/2015  .  Hyperkalemia 04/27/2015  . Recurrent UTI (urinary tract infection) 12/24/2014  . Hyponatremia 09/12/2014  . GERD (gastroesophageal reflux disease) 01/06/2014  . Urge incontinence 11/26/2013  . Chronic pain 09/13/2012  . Preventative health care 11/03/2010  . Bilateral renal cysts 01/15/2009  . Anxiety state 06/12/2008  . CKD (chronic kidney disease) stage 3, GFR 30-59 ml/min 06/12/2008  . Diabetes mellitus with stage 3 chronic kidney disease ([place]) 12/28/2005  . Hyperlipidemia associated with type 2  diabetes mellitus ([place]) 12/28/2005  . Hypertension associated with diabetes ([place]) 12/28/2005   Past Medical History:  Diagnosis Date  . Anemia    due to menorrhagia, BL 8-10  . Anxiety   . Bilateral renal cysts 01/15/2009   Qualifier: Diagnosis of  By: Tyrell Antonio MD, [place]    . Chronic abdominal pain   . Chronic back pain   . CKD (chronic kidney disease) stage 3, GFR 30-59 ml/min    baseline creatinine 1.4-1.7  . Colitis   . Congenital heart defect    surgically corrected as a child  . DDD (degenerative disc disease), lumbar   . Depression   . Diabetes mellitus type II, uncontrolled ([place])   . Diabetes mellitus without complication ([place])   . History of palpitations    evaluated recently 12'15  . Hyperlipidemia   . Hypertension   . IBS (irritable bowel syndrome)   . Ovarian cyst, left   . Postmenopausal bleeding 06/12/2008  . UTI (lower urinary tract infection)   . Vaginal cyst    nabothian and bartholin   Family History  Problem Relation Age of Onset  . Stroke Father   . Heart attack Father     Had MI in his 36s  . Stomach cancer Paternal Grandmother   . Diabetes Maternal Grandmother   . Cerebral palsy Daughter   . Anesthesia problems Neg Hx   . Hypotension Neg Hx   . Malignant hyperthermia Neg Hx   . Pseudochol deficiency Neg Hx    Past Surgical History:  Procedure Laterality Date  . CARDIAC SURGERY     to repair congenital defect as a child- 53months old  . COLONOSCOPY WITH PROPOFOL N/A 10/01/2014   Procedure: COLONOSCOPY WITH PROPOFOL;  Surgeon: Arta Silence, MD;  [facility];  Service: Endoscopy;  Laterality: N/A;  . ESOPHAGOGASTRODUODENOSCOPY (EGD) WITH PROPOFOL N/A 10/01/2014   Procedure: ESOPHAGOGASTRODUODENOSCOPY (EGD) WITH PROPOFOL;  Surgeon: Arta Silence, MD;  [facility];  Service: Endoscopy;  Laterality: N/A;   Social History   Occupational History  . 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  Smoking status: Never Smoker  . Smokeless tobacco: Never Used  . Alcohol use No  . Drug use: No  . Sexual activity: Not on file

## 2016-01-04 ENCOUNTER — Telehealth: Payer: Self-pay | Admitting: Internal Medicine

## 2016-01-04 NOTE — Telephone Encounter (Signed)
APT. REMINDER CALL, LMTCB °

## 2016-01-05 ENCOUNTER — Encounter: Payer: Medicaid Other | Admitting: Internal Medicine

## 2016-01-05 ENCOUNTER — Encounter: Payer: Self-pay | Admitting: Internal Medicine

## 2016-01-21 ENCOUNTER — Encounter (HOSPITAL_COMMUNITY): Payer: Self-pay | Admitting: Emergency Medicine

## 2016-01-21 ENCOUNTER — Ambulatory Visit (HOSPITAL_COMMUNITY)
Admission: EM | Admit: 2016-01-21 | Discharge: 2016-01-21 | Disposition: A | Discharge status: Home / Self Care | Payer: Medicaid Other | Attending: Emergency Medicine | Admitting: Emergency Medicine

## 2016-01-21 DIAGNOSIS — S39012A Strain of muscle, fascia and tendon of lower back, initial encounter: Secondary | ICD-10-CM | POA: Diagnosis not present

## 2016-01-21 LAB — POCT URINALYSIS DIP (DEVICE)
BILIRUBIN URINE: NEGATIVE
Glucose, UA: 100 mg/dL — AB
Ketones, ur: NEGATIVE mg/dL
NITRITE: NEGATIVE
PH: 5.5 (ref 5.0–8.0)
Protein, ur: NEGATIVE mg/dL
SPECIFIC GRAVITY, URINE: 1.02 (ref 1.005–1.030)
Urobilinogen, UA: 0.2 mg/dL (ref 0.0–1.0)

## 2016-01-21 NOTE — Discharge Instructions (Signed)
Follow with your primary provider You do not have a urinary infectiosn and may need to follow with your spine surgeon

## 2016-01-21 NOTE — ED Provider Notes (Signed)
[place]    CSN: [id number] Arrival date & time: 01/21/16  1551     History   Chief Complaint Chief Complaint  Patient presents with  . Back Pain    HPI [name] is a 60 y.o. female.   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 Rosana Hoes of urology No fever no chills no n No vomit Some mild abd pain No cp No Vomit No chills no rigors  Significant L knee pains making it hard for her to ambulate Some LBP which suddenyl catches her at times No cp No chills no rigors No dark nor tarry stool  HPI  Past Medical History:  Diagnosis Date  . Anemia    due to menorrhagia, BL 8-10  . Anxiety   . Bilateral renal cysts 01/15/2009   Qualifier: Diagnosis of  By: Tyrell Antonio MD, [place]    . Chronic abdominal pain   . Chronic back pain   . CKD (chronic kidney disease) stage 3, GFR 30-59 ml/min    baseline creatinine 1.4-1.7  . Colitis   . Congenital heart defect    surgically corrected as a child  . DDD (degenerative disc disease), lumbar   . Depression   . Diabetes mellitus type II, uncontrolled ([place])   . Diabetes mellitus without complication ([place])   . History of palpitations    evaluated recently 12'15  . Hyperlipidemia   . Hypertension   . IBS (irritable bowel syndrome)   . Ovarian cyst, left   . Postmenopausal bleeding 06/12/2008  . UTI (lower urinary tract infection)   . Vaginal cyst    [facility] and [facility]    Patient Active Problem List   Diagnosis Date Noted  . Right hip pain 11/30/2015  . Weight gain 10/19/2015  . Osteoarthritis of left knee 06/29/2015  . Bicornuate uterus 06/22/2015  . RLQ abdominal pain 06/12/2015  . Hyperkalemia 04/27/2015  . Recurrent UTI (urinary tract infection) 12/24/2014  . Hyponatremia 09/12/2014    . GERD (gastroesophageal reflux disease) 01/06/2014  . Urge incontinence 11/26/2013  . Chronic pain 09/13/2012  . Preventative health care 11/03/2010  . Bilateral renal cysts 01/15/2009  . Anxiety state 06/12/2008  . CKD (chronic kidney disease) stage 3, GFR 30-59 ml/min 06/12/2008  . Diabetes mellitus with stage 3 chronic kidney disease ([place]) 12/28/2005  . Hyperlipidemia associated with type 2 diabetes mellitus ([place]) 12/28/2005  . Hypertension associated with diabetes ([place]) 12/28/2005    Past Surgical History:  Procedure Laterality Date  . CARDIAC SURGERY     to repair congenital defect as a child- 11months old  . COLONOSCOPY WITH PROPOFOL N/A 10/01/2014   Procedure: COLONOSCOPY WITH PROPOFOL;  Surgeon: Arta Silence, MD;  Location: [facility];  Service: Endoscopy;  Laterality: N/A;  . ESOPHAGOGASTRODUODENOSCOPY (EGD) WITH PROPOFOL N/A 10/01/2014   Procedure: ESOPHAGOGASTRODUODENOSCOPY (EGD) WITH PROPOFOL;  Surgeon: Arta Silence, MD;  Location: [facility];  Service: Endoscopy;  Laterality: N/A;    OB History    Gravida Para Term Preterm AB Living   3 1 1   2 1    SAB TAB Ectopic Multiple Live Births   2       1       Home Medications    Prior to Admission medications   Medication Sig Start  Date End Date Taking? Authorizing Provider  acetaminophen (TYLENOL) 500 MG tablet Take 1,000 mg by mouth every 6 (six) hours as needed for moderate pain.    Historical Provider, MD  acetaminophen (TYLENOL) 650 MG CR tablet Take 1,300 mg by mouth 2 (two) times daily.     Historical Provider, MD  aspirin 81 MG chewable tablet Chew 81 mg by mouth daily.    Historical Provider, MD  azithromycin (ZITHROMAX Z-PAK) 250 MG tablet Take 2 pills today, then 1 pill daily until gone. 12/23/15   Melony Overly, MD  chlorpheniramine-HYDROcodone (TUSSIONEX PENNKINETIC ER) 10-8 MG/5ML SUER Take 5 mLs by mouth every 12 (twelve) hours as needed for cough. 12/23/15   Melony Overly, MD  enalapril (VASOTEC) 10  MG tablet Take 1 tablet (10 mg total) by mouth daily. 10/19/15   Asencion Partridge, MD  gabapentin (NEURONTIN) 300 MG capsule Take 1 capsule (300 mg total) by mouth 3 (three) times daily. 12/28/15   Gerda Diss, DO  Insulin Glargine (LANTUS SOLOSTAR) 100 UNIT/ML Solostar Pen Take 12 units in the morning and 12 units in the evening. 11/03/15   Lorella Nimrod, MD  oxybutynin (DITROPAN XL) 10 MG 24 hr tablet Take 1 tablet (10 mg total) by mouth at bedtime. 12/19/15   Lezlie Lye, NP  oxybutynin (DITROPAN XL) 10 MG 24 hr tablet Take 1 tablet (10 mg total) by mouth at bedtime. 12/19/15   Lezlie Lye, NP  pioglitazone (ACTOS) 15 MG tablet Take 15 mg by mouth daily. 12/08/15 12/07/16  Historical Provider, MD  predniSONE (DELTASONE) 50 MG tablet Take 1 pill daily for 5 days. 12/23/15   Melony Overly, MD    Family History Family History  Problem Relation Age of Onset  . Stroke Father   . Heart attack Father     Had MI in his 57s  . Stomach cancer Paternal Grandmother   . Diabetes Maternal Grandmother   . Cerebral palsy Daughter   . Anesthesia problems Neg Hx   . Hypotension Neg Hx   . Malignant hyperthermia Neg Hx   . Pseudochol deficiency Neg Hx     Social History Social History  Substance Use Topics  . Smoking status: Never Smoker  . Smokeless tobacco: Never Used  . Alcohol use No     Allergies   Patient has no known allergies.   Review of Systems Review of Systems   Physical Exam Triage Vital Signs ED Triage Vitals [01/21/16 1640]  Enc Vitals Group     BP 166/94     Pulse Rate 83     Resp 18     Temp 98.4 F (36.9 C)     Temp Source Oral     SpO2 97 %     Weight      Height      Head Circumference      Peak Flow      Pain Score      Pain Loc      Pain Edu?      Excl. in [place]?    No data found.   Updated Vital Signs BP 166/94 (BP Location: Right Arm)   Pulse 83   Temp 98.4 F (36.9 C) (Oral)   Resp 18   SpO2 97%   Visual Acuity Right Eye Distance:     Left Eye Distance:   Bilateral Distance:    Right Eye Near:   Left Eye Near:    Bilateral Near:  Physical Exam  eomi ncat  s1 s2 no m/r/g abd soft nt nd no rebound No CVa tedner abd slight tender In lower quad No reboudn No le swelling moves 4 limbs equally UC Treatments / Results  Labs (all labs ordered are listed, but only abnormal results are displayed) Labs Reviewed - No data to display  EKG  EKG Interpretation None       Radiology No results found.  Procedures Procedures (including critical care time)  Medications Ordered in UC Medications - No data to display   Initial Impression / Assessment and Plan / UC Course  I have reviewed the triage vital signs and the nursing notes.  Pertinent labs & imaging results that were available during my care of the patient were reviewed by me and considered in my medical decision making (see chart for details).  Clinical Course     5:00 PM Get UA  5:04 PM UA shows only leukocytes Will reassure patient and d/c home for OP follow up with PCP as no Urinary infection  Final Clinical Impressions(s) / UC Diagnoses   Final diagnoses:  None    New Prescriptions New Prescriptions   No medications on file     Nita Sells, MD 01/21/16 1705

## 2016-01-21 NOTE — ED Triage Notes (Signed)
Here for lower back pain onset 3-4 day associated w/urinary freq  Denies inj/trama, fevers  A&O x4... NAD

## 2016-02-27 IMAGING — CT CT ABD-PELV W/ CM
2 of 5 series · 16 of 46 positions shown, 18 images · IV contrast (omnipaque)
Comparison: 01/18/2013

CLINICAL DATA: Right lower quadrant abdominal pain.

EXAM:
CT ABDOMEN AND PELVIS WITH CONTRAST
TECHNIQUE: Multidetector CT imaging of the abdomen and pelvis was performed
using the standard protocol following bolus administration of
intravenous contrast.
CONTRAST:  80mL OMNIPAQUE IOHEXOL 300 MG/ML  SOLN

[Series 2: abd/ pelvis 5.0 i30f 1 · axial · 0.74mm/px · z∈[-525,-90]mm · 13 of 99 slices shown, 15 images]
[im 6/99  soft-tissue]
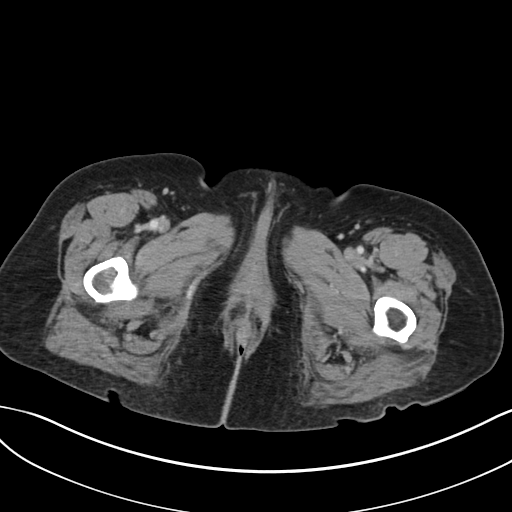
[im 6/99  bone]
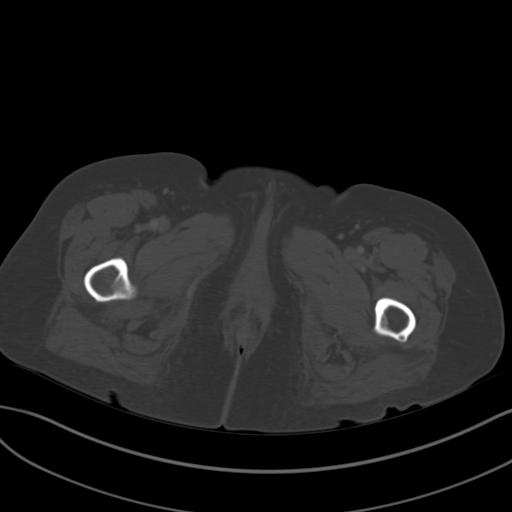
[im 16/99  soft-tissue]
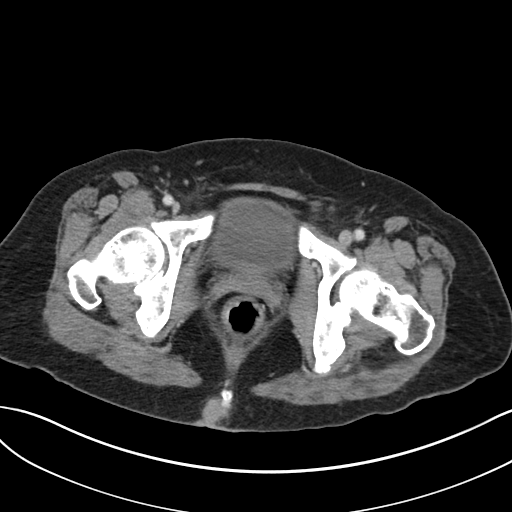
[im 21/99  soft-tissue]
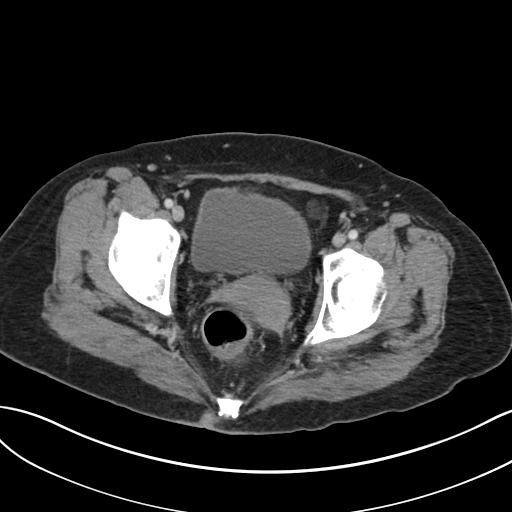
[im 26/99  soft-tissue]
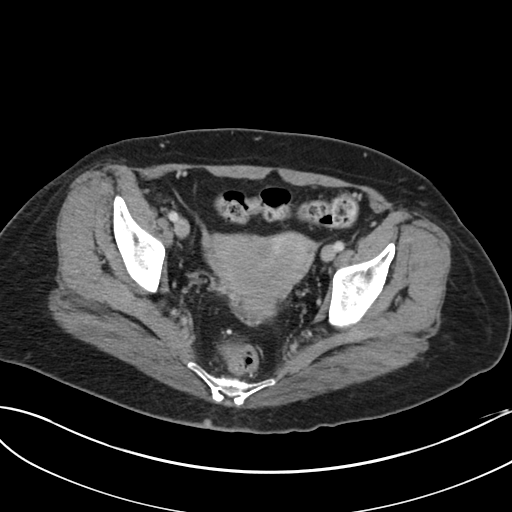
[im 37/99  soft-tissue]
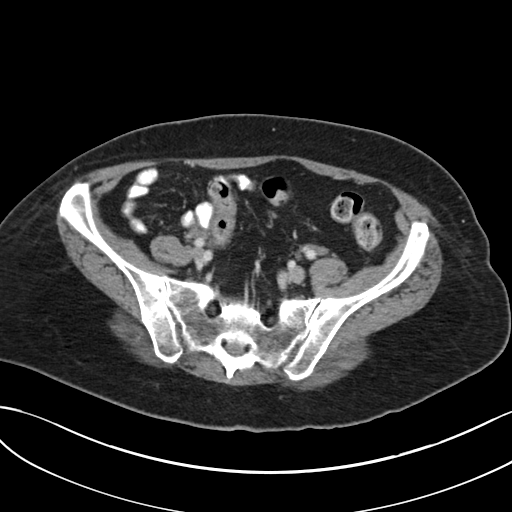
[im 42/99  soft-tissue]
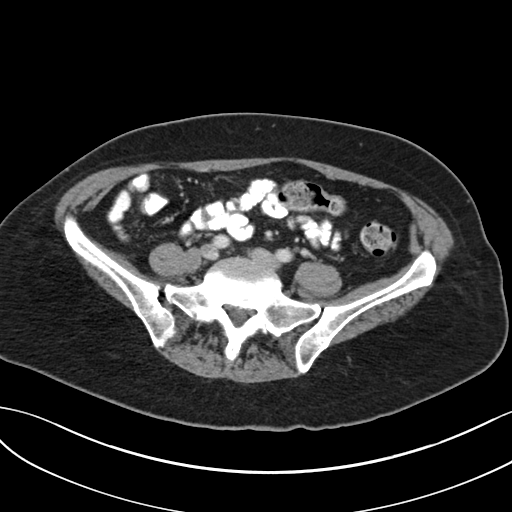
[im 52/99  soft-tissue]
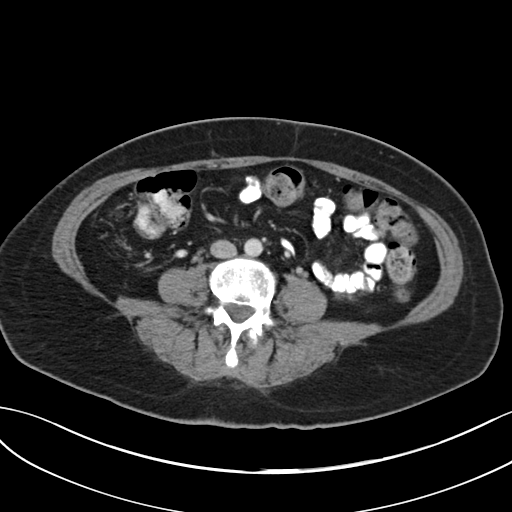
[im 57/99  soft-tissue]
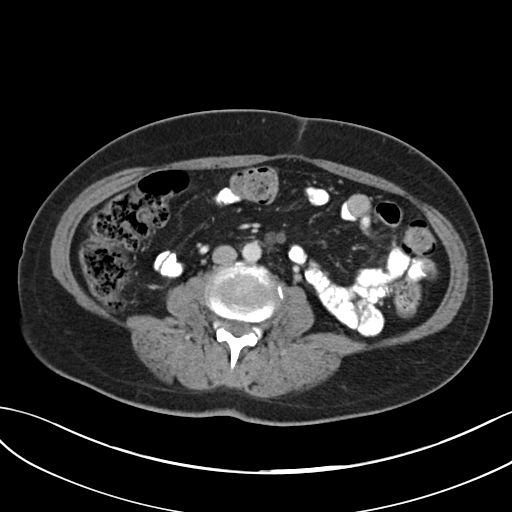
[im 62/99  soft-tissue]
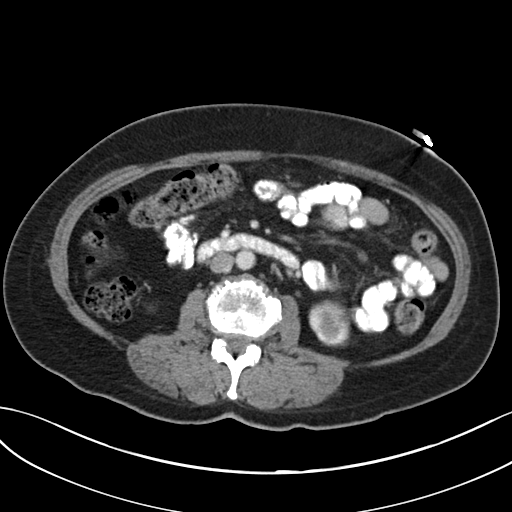
[im 62/99  bone]
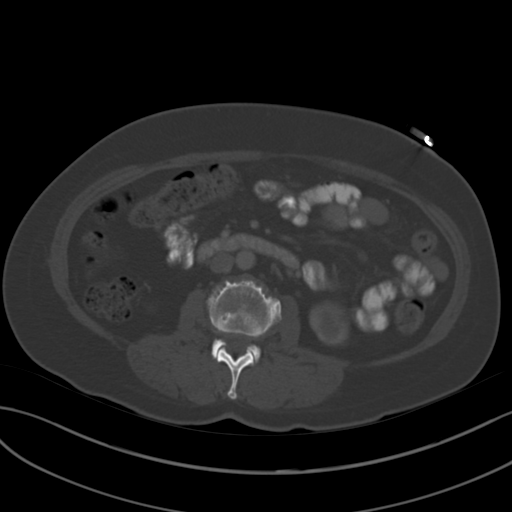
[im 73/99  soft-tissue]
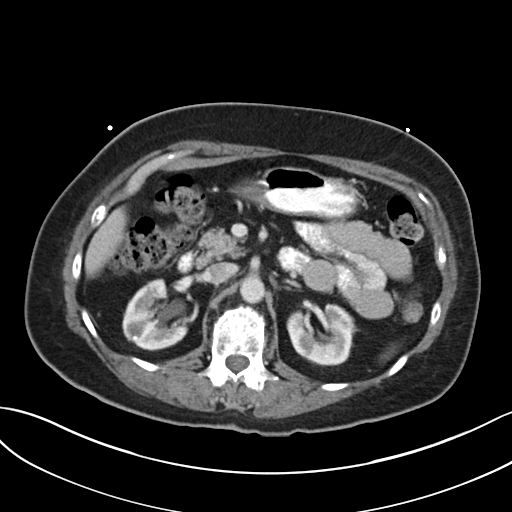
[im 78/99  soft-tissue]
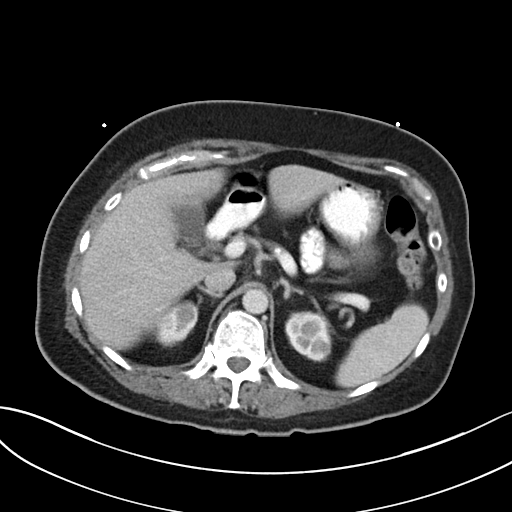
[im 83/99  soft-tissue]
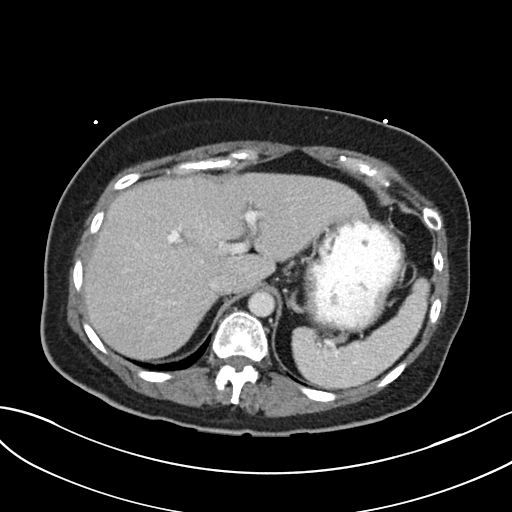
[im 93/99  soft-tissue]
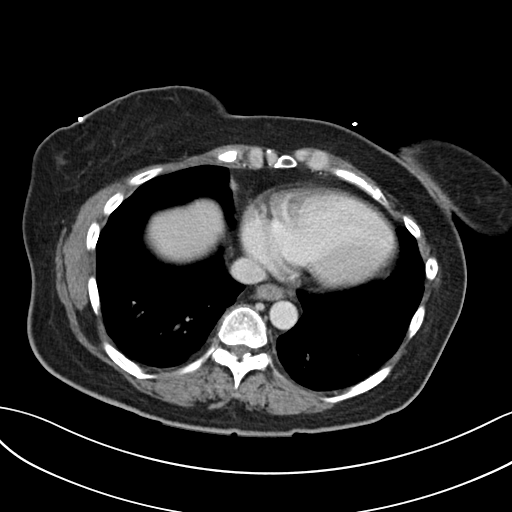

[Series 5: coronals · coronal · 0.70mm/px · 3 of 108 slices shown]
[im 36/108  soft-tissue]
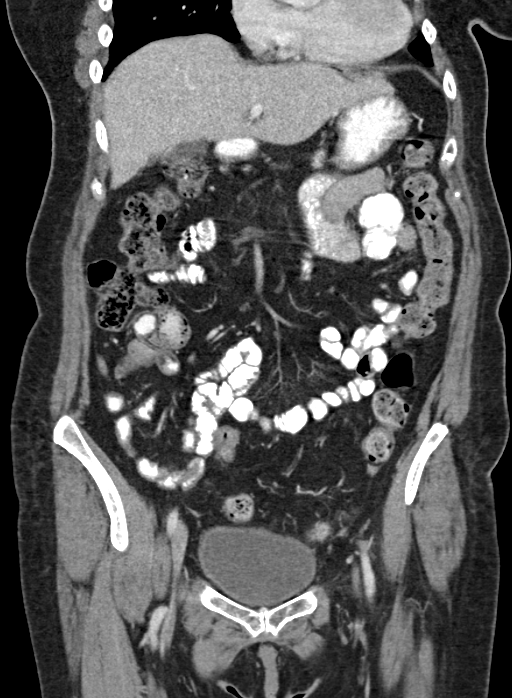
[im 48/108  soft-tissue]
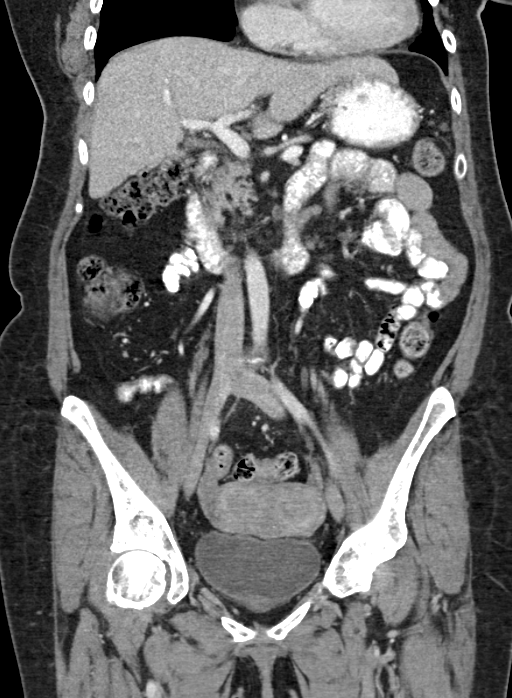
[im 60/108  soft-tissue]
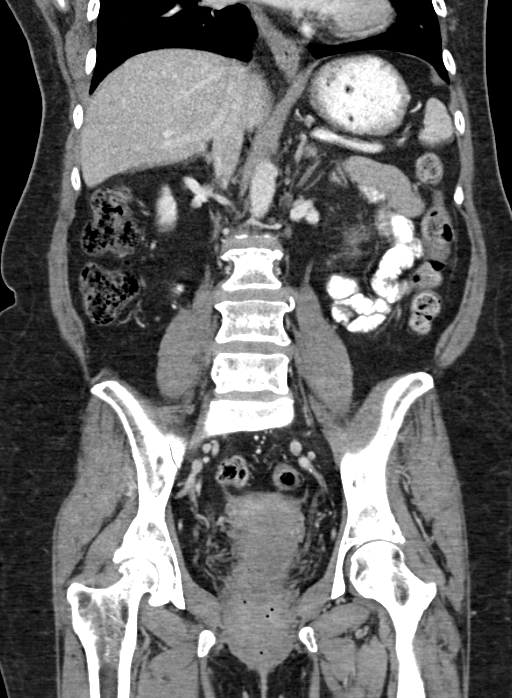

[16 of 46 positions shown; findings below may reference images not displayed]

FINDINGS: Lung bases:

Minimal dependent atelectasis. No pleural effusion or pulmonary
lesion. The heart is within normal limits in size and stable. No
pericardial effusion. The distal esophagus is grossly normal.

CT abdomen:

The liver is unremarkable. No focal lesions or biliary dilatation.
The gallbladder is normal. No common bile duct dilatation. The
pancreas is unremarkable. Moderate to advanced atrophy of the body
and tail is noted. The spleen is normal in size. No focal lesions.
The adrenal glands and kidneys are unremarkable. No inflammatory
changes or hydronephrosis. Stable appearing right renal cysts
complicated by septations and calcification. Other slightly
hyperdense renal cysts are noted but appears stable. Recommend
continued surveillance.

The stomach, duodenum, small bowel and colon are unremarkable. No
inflammatory changes, mass lesions or obstructive findings. The
appendix is normal. No mesenteric or retroperitoneal mass or
adenopathy. The aorta and branch vessels are normal and stable. The
major venous structures are patent.

CT pelvis:

Stable septate uterus. The ovaries are normal. No pelvic mass or
adenopathy. The bladder is normal. No free pelvic fluid collections.
No inguinal mass or adenopathy.

The bony structures are intact.  Stable degenerative changes.
IMPRESSION: No acute abdominal/ pelvic findings, mass lesions or adenopathy.

Overall stable complex bilateral renal cysts. Recommend continued
surveillance.

Septate uterus.

## 2016-03-08 ENCOUNTER — Telehealth: Payer: Self-pay | Admitting: Sports Medicine

## 2016-03-08 MED ORDER — GABAPENTIN 300 MG PO CAPS: 300.0000 mg | ORAL_CAPSULE | Freq: Three times a day (TID) | ORAL | 2 refills | Status: AC

## 2016-03-08 NOTE — Telephone Encounter (Signed)
Called [phone] and LM to have pt call the office.

## 2016-03-08 NOTE — Telephone Encounter (Signed)
I am able to provide her one additional refill.  This is the final refill I am able to provide and this has been sent to her pharmacy and [facility].  She should follow-up with her PCP or establish with an other provider that is going to be able to continue to see her.  She should ask her PCP if they are comfortable prescribing this as this is a common medication for primary physicians to prescribe.

## 2016-03-08 NOTE — Telephone Encounter (Signed)
**  Remind patient they can make refill requests via MyChart**  Medication refill request (Name & Dosage): gabapentin (NEURONTIN) 300 MG capsule     Preferred pharmacy (Name & Address): [facility] - [place], [place]     Other comments (if applicable):   Patient stated you had seen her at [facility] but "she knew her PCP would not refill it".

## 2016-03-10 NOTE — Telephone Encounter (Signed)
Called pt and LMOM to call the office.

## 2016-03-11 NOTE — Telephone Encounter (Signed)
Called pt and she advised that her PCP refilled her medication for her.

## 2016-08-28 ENCOUNTER — Emergency Department (HOSPITAL_COMMUNITY): Payer: Medicaid Other

## 2016-08-28 ENCOUNTER — Encounter (HOSPITAL_COMMUNITY): Payer: Self-pay | Admitting: Emergency Medicine

## 2016-08-28 ENCOUNTER — Emergency Department (HOSPITAL_COMMUNITY)
Admission: EM | Admit: 2016-08-28 | Discharge: 2016-08-28 | Disposition: A | Discharge status: Home / Self Care | Payer: Medicaid Other | Attending: Emergency Medicine | Admitting: Emergency Medicine

## 2016-08-28 DIAGNOSIS — Y92002 Bathroom of unspecified non-institutional (private) residence single-family (private) house as the place of occurrence of the external cause: Secondary | ICD-10-CM | POA: Diagnosis not present

## 2016-08-28 DIAGNOSIS — Z794 Long term (current) use of insulin: Secondary | ICD-10-CM | POA: Insufficient documentation

## 2016-08-28 DIAGNOSIS — N183 Chronic kidney disease, stage 3 (moderate): Secondary | ICD-10-CM | POA: Diagnosis not present

## 2016-08-28 DIAGNOSIS — Z79899 Other long term (current) drug therapy: Secondary | ICD-10-CM | POA: Insufficient documentation

## 2016-08-28 DIAGNOSIS — S322XXA Fracture of coccyx, initial encounter for closed fracture: Secondary | ICD-10-CM | POA: Diagnosis not present

## 2016-08-28 DIAGNOSIS — S3992XA Unspecified injury of lower back, initial encounter: Secondary | ICD-10-CM | POA: Diagnosis present

## 2016-08-28 DIAGNOSIS — I129 Hypertensive chronic kidney disease with stage 1 through stage 4 chronic kidney disease, or unspecified chronic kidney disease: Secondary | ICD-10-CM | POA: Insufficient documentation

## 2016-08-28 DIAGNOSIS — Z7982 Long term (current) use of aspirin: Secondary | ICD-10-CM | POA: Diagnosis not present

## 2016-08-28 DIAGNOSIS — Y999 Unspecified external cause status: Secondary | ICD-10-CM | POA: Insufficient documentation

## 2016-08-28 DIAGNOSIS — Y939 Activity, unspecified: Secondary | ICD-10-CM | POA: Insufficient documentation

## 2016-08-28 DIAGNOSIS — W1812XA Fall from or off toilet with subsequent striking against object, initial encounter: Secondary | ICD-10-CM | POA: Insufficient documentation

## 2016-08-28 DIAGNOSIS — E1122 Type 2 diabetes mellitus with diabetic chronic kidney disease: Secondary | ICD-10-CM | POA: Insufficient documentation

## 2016-08-28 DIAGNOSIS — W19XXXA Unspecified fall, initial encounter: Secondary | ICD-10-CM

## 2016-08-28 LAB — URINALYSIS, ROUTINE W REFLEX MICROSCOPIC
Bilirubin Urine: NEGATIVE
GLUCOSE, UA: NEGATIVE mg/dL
Hgb urine dipstick: NEGATIVE
KETONES UR: NEGATIVE mg/dL
Nitrite: NEGATIVE
PROTEIN: NEGATIVE mg/dL
Specific Gravity, Urine: 1.011 (ref 1.005–1.030)
pH: 5 (ref 5.0–8.0)

## 2016-08-28 LAB — CBG MONITORING, ED: GLUCOSE-CAPILLARY: 186 mg/dL — AB (ref 65–99)

## 2016-08-28 MED ORDER — TRAMADOL HCL 50 MG PO TABS: 50.0000 mg | ORAL_TABLET | Freq: Four times a day (QID) | ORAL | 0 refills | Status: AC | PRN

## 2016-08-28 MED ORDER — TRAMADOL HCL 50 MG PO TABS
50.0000 mg | ORAL_TABLET | Freq: Once | ORAL | Status: AC
  Administered 2016-08-28: 50 mg via ORAL
  Filled 2016-08-28: qty 1

## 2016-08-28 MED ORDER — DOCUSATE SODIUM 100 MG PO CAPS: 100.0000 mg | ORAL_CAPSULE | Freq: Two times a day (BID) | ORAL | 0 refills | Status: AC

## 2016-08-28 MED ORDER — ONDANSETRON 4 MG PO TBDP
4.0000 mg | ORAL_TABLET | Freq: Once | ORAL | Status: AC
  Administered 2016-08-28: 4 mg via ORAL
  Filled 2016-08-28: qty 1

## 2016-08-28 NOTE — Discharge Instructions (Signed)
There is a fracture noted to the tailbone. The treatment for this is pain management and sometimes physical therapy. Sleep on your side. A ring type pillow may help with comfort while sitting. Follow up with orthopedics versus your primary care provider on this matter. There was some prominence noted to the appendix on the CT scan. This will need follow-up and evaluation. Follow up with gastroenterology on this matter as soon as possible. Report to the ED should you develop fever, vomiting, increasing abdominal pain, numbness, weakness, or any other concerning symptoms.  Use the tramadol as needed for pain. Do not drive or perform other dangerous activities while taking the tramadol. Colace is a stool softener. Use the Colace as needed to prevent constipation, which can be much more painful with a tailbone fracture.

## 2016-08-28 NOTE — ED Triage Notes (Signed)
Pt fell last night while getting off the toilet. Has neuropathy in feet. No LOC. Did not hit head. Pt reports midline tailbone pain since then. Pt ambulatory to triage.

## 2016-08-28 NOTE — ED Provider Notes (Signed)
[facility] DEPT Provider Note   CSN: [id number] Arrival date & time: 08/28/16  1408  By signing my name below, I, Margit Banda, attest that this documentation has been prepared under the direction and in the presence of Shaquan Puerta, PA-C. Electronically Signed: Margit Banda, ED Scribe. 08/28/16. 3:38 PM.  History   Chief Complaint Chief Complaint  Patient presents with  . Fall    HPI [name] is a 61 y.o. female with a PMHx of neuropathy, who presents to the Emergency Department with a chief complaint of midline tailbone pain s/p a fall that occurred ~ 4:30-5 am, 08/28/16. Pt was getting off the toilet when she slipped and landed on the floor on her buttocks. Pt tried using ice, taking tylenol extra strength and goody powder with mild relief. Called her PCP about her sx and was told to come to the ED. Her pain is aching/throbbing, 7 out of 10, nonradiating. Pt denies LOC, head injury, neuro deficits, changes in bowel or bladder function, saddle anesthesias, or any other complaints.    The history is provided by the patient. No language interpreter was used.    Past Medical History:  Diagnosis Date  . Anemia    due to menorrhagia, BL 8-10  . Anxiety   . Bilateral renal cysts 01/15/2009   Qualifier: Diagnosis of  By: Tyrell Antonio MD, [place]    . Chronic abdominal pain   . Chronic back pain   . CKD (chronic kidney disease) stage 3, GFR 30-59 ml/min    baseline creatinine 1.4-1.7  . Colitis   . Congenital heart defect    surgically corrected as a child  . DDD (degenerative disc disease), lumbar   . Depression   . Diabetes mellitus type II, uncontrolled ([place])   . Diabetes mellitus without complication ([place])   . History of palpitations    evaluated recently 12'15  . Hyperlipidemia   . Hypertension   . IBS (irritable bowel syndrome)   . Ovarian cyst, left   . Postmenopausal bleeding 06/12/2008  . UTI (lower urinary tract infection)   . Vaginal cyst    nabothian and  bartholin    Patient Active Problem List   Diagnosis Date Noted  . Right hip pain 11/30/2015  . Weight gain 10/19/2015  . Osteoarthritis of left knee 06/29/2015  . Bicornuate uterus 06/22/2015  . RLQ abdominal pain 06/12/2015  . Hyperkalemia 04/27/2015  . Recurrent UTI (urinary tract infection) 12/24/2014  . Hyponatremia 09/12/2014  . GERD (gastroesophageal reflux disease) 01/06/2014  . Urge incontinence 11/26/2013  . Chronic pain 09/13/2012  . Preventative health care 11/03/2010  . Bilateral renal cysts 01/15/2009  . Anxiety state 06/12/2008  . CKD (chronic kidney disease) stage 3, GFR 30-59 ml/min 06/12/2008  . Diabetes mellitus with stage 3 chronic kidney disease ([place]) 12/28/2005  . Hyperlipidemia associated with type 2 diabetes mellitus ([place]) 12/28/2005  . Hypertension associated with diabetes ([place]) 12/28/2005    Past Surgical History:  Procedure Laterality Date  . CARDIAC SURGERY     to repair congenital defect as a child- 46months old  . COLONOSCOPY WITH PROPOFOL N/A 10/01/2014   Procedure: COLONOSCOPY WITH PROPOFOL;  Surgeon: Arta Silence, MD;  Location: [facility] ENDOSCOPY;  Service: Endoscopy;  Laterality: N/A;  . ESOPHAGOGASTRODUODENOSCOPY (EGD) WITH PROPOFOL N/A 10/01/2014   Procedure: ESOPHAGOGASTRODUODENOSCOPY (EGD) WITH PROPOFOL;  Surgeon: Arta Silence, MD;  Location: [facility] ENDOSCOPY;  Service: Endoscopy;  Laterality: N/A;    OB History    Gravida Para Term Preterm AB Living  3 1 1   2 1    SAB TAB Ectopic Multiple Live Births   2       1       Home Medications    Prior to Admission medications   Medication Sig Start Date End Date Taking? Authorizing Provider  acetaminophen (TYLENOL) 500 MG tablet Take 1,000 mg by mouth every 6 (six) hours as needed for moderate pain.    [provider]  acetaminophen (TYLENOL) 650 MG CR tablet Take 1,300 mg by mouth 2 (two) times daily.     [provider]  aspirin 81 MG chewable tablet Chew 81 mg by mouth  daily.    [provider]  azithromycin (ZITHROMAX Z-PAK) 250 MG tablet Take 2 pills today, then 1 pill daily until gone. 12/23/15   Melony Overly, MD  chlorpheniramine-HYDROcodone (TUSSIONEX PENNKINETIC ER) 10-8 MG/5ML SUER Take 5 mLs by mouth every 12 (twelve) hours as needed for cough. 12/23/15   Melony Overly, MD  docusate sodium (COLACE) 100 MG capsule Take 1 capsule (100 mg total) by mouth every 12 (twelve) hours. 08/28/16   Blong Busk C, PA-C  enalapril (VASOTEC) 10 MG tablet Take 1 tablet (10 mg total) by mouth daily. 10/19/15   Asencion Partridge, MD  gabapentin (NEURONTIN) 300 MG capsule Take 1 capsule (300 mg total) by mouth 3 (three) times daily. 03/08/16   Gerda Diss, DO  Insulin Glargine (LANTUS SOLOSTAR) 100 UNIT/ML Solostar Pen Take 12 units in the morning and 12 units in the evening. 11/03/15   Lorella Nimrod, MD  oxybutynin (DITROPAN XL) 10 MG 24 hr tablet Take 1 tablet (10 mg total) by mouth at bedtime. 12/19/15   Rasch, Anderson Malta I, NP  oxybutynin (DITROPAN XL) 10 MG 24 hr tablet Take 1 tablet (10 mg total) by mouth at bedtime. 12/19/15   Rasch, Anderson Malta I, NP  pioglitazone (ACTOS) 15 MG tablet Take 15 mg by mouth daily. 12/08/15 12/07/16  [provider]  predniSONE (DELTASONE) 50 MG tablet Take 1 pill daily for 5 days. 12/23/15   Melony Overly, MD  traMADol (ULTRAM) 50 MG tablet Take 1 tablet (50 mg total) by mouth every 6 (six) hours as needed. 08/28/16   Rayel Santizo, Helane Gunther, PA-C    Family History Family History  Problem Relation Age of Onset  . Stroke Father   . Heart attack Father        Had MI in his 66s  . Stomach cancer Paternal Grandmother   . Diabetes Maternal Grandmother   . Cerebral palsy Daughter   . Anesthesia problems Neg Hx   . Hypotension Neg Hx   . Malignant hyperthermia Neg Hx   . Pseudochol deficiency Neg Hx     Social History Social History  Substance Use Topics  . Smoking status: Never Smoker  . Smokeless tobacco: Never Used  . Alcohol  use No     Allergies   Patient has no known allergies.   Review of Systems Review of Systems  Respiratory: Negative for shortness of breath.   Cardiovascular: Negative for chest pain.  Gastrointestinal: Negative for abdominal pain, nausea and vomiting.  Musculoskeletal: Positive for back pain. Negative for neck pain.  Neurological: Negative for dizziness, syncope, weakness, light-headedness, numbness and headaches.  All other systems reviewed and are negative.    Physical Exam Updated Vital Signs BP 135/83   Pulse 85   Temp 98.1 F (36.7 C) (Oral)   Resp 16   SpO2 95%  Physical Exam  Constitutional: She appears well-developed and well-nourished. No distress.  HENT:  Head: Normocephalic and atraumatic.  Eyes: Pupils are equal, round, and reactive to light. Conjunctivae and EOM are normal.  Neck: Neck supple.  Cardiovascular: Normal rate, regular rhythm, normal heart sounds and intact distal pulses.   Pulmonary/Chest: Effort normal and breath sounds normal. No respiratory distress.  Abdominal: Soft. There is no tenderness. There is no guarding.  Specifically, no right lower quadrant tenderness. No McBurney's point tenderness.  Genitourinary: Rectal exam shows anal tone normal.  Genitourinary Comments: Rectal tone is normal. No gross blood. No rectal tenderness. RN, Cyril Mourning, served as chaperone during the rectal exam.  Musculoskeletal: She exhibits tenderness. She exhibits no edema.  Tenderness at the base of midline sacrum and coccyx. No noted step-off or instability noted. Pelvis appears to be stable to internal and external rotation attempts of the iliac wings.  Normal motor function intact in all extremities and spine. No other midline spinal tenderness.   Lymphadenopathy:    She has no cervical adenopathy.  Neurological: She is alert. No sensory deficit.  No acute sensory deficits. Strength bilateral lower extremities 5/5, tested in the cardinal directions of the  hips, knees, and ankles.. No saddle anesthesia. Coordination intact. Slow, but steady antalgic gait without assistance.  Skin: Skin is warm and dry. She is not diaphoretic.  Psychiatric: She has a normal mood and affect. Her behavior is normal.  Nursing note and vitals reviewed.    ED Treatments / Results  Labs (all labs ordered are listed, but only abnormal results are displayed) Labs Reviewed  URINALYSIS, ROUTINE W REFLEX MICROSCOPIC - Abnormal; Notable for the following:       Result Value   APPearance HAZY (*)    Leukocytes, UA LARGE (*)    Bacteria, UA RARE (*)    Squamous Epithelial / LPF 0-5 (*)    All other components within normal limits  CBG MONITORING, ED - Abnormal; Notable for the following:    Glucose-Capillary 186 (*)    All other components within normal limits  CBG MONITORING, ED    EKG  EKG Interpretation None       Ct Pelvis Wo Contrast  Result Date: 08/28/2016 CLINICAL DATA:  Sacral pain after fall earlier this morning. EXAM: CT PELVIS WITHOUT CONTRAST TECHNIQUE: Multidetector CT imaging of the pelvis was performed following the standard protocol without intravenous contrast. COMPARISON:  CT scan dated 08/02/2014 FINDINGS: Urinary Tract:  Unremarkable Bowel: Mildly thickened appendix at 8 mm. Some of this may be due to intraluminal contents, and I do not see periappendiceal stranding. Vascular/Lymphatic: Unremarkable Reproductive: Uterine fundal contour appearance is compatible with the patient's history of bicornuate uterus. Other: Area of prior fat necrosis in the left lower quadrant omentum, image 22/3 - rim calcified, believed to be chronic. Musculoskeletal: Transverse fracture the upper coccyx just below the sacrococcygeal junction on image 106/6. This bony discontinuity was not present on 08/02/2014 and appears acute. There is some subtle adjacent pre coccygeal edema. No other pelvic fractures identified. There is spurring of both acetabula and femoral  heads. There is a disc bulge at L5-S1 and potentially a left lateral recess disc protrusion. IMPRESSION: 1. Transverse acute fracture of the upper coccyx. 2. Disc bulge at L5-S1 potentially with a small left lateral recess disc protrusion. 3. Mild appendiceal prominence at 8 mm diameter. Some of this may be due to intraluminal contents and there is no surrounding periappendiceal stranding to further suggest acute appendicitis. Correlate  with patient symptoms. 4. Uterine fundal contour compatible with bicornuate uterus. Electronically Signed   By: Van Clines M.D.   On: 08/28/2016 16:19     Procedures Procedures (including critical care time)  Medications Ordered in ED Medications  traMADol (ULTRAM) tablet 50 mg (50 mg Oral Given 08/28/16 1554)  ondansetron (ZOFRAN-ODT) disintegrating tablet 4 mg (4 mg Oral Given 08/28/16 1726)     Initial Impression / Assessment and Plan / ED Course  I have reviewed the triage vital signs and the nursing notes.  Pertinent labs & imaging results that were available during my care of the patient were reviewed by me and considered in my medical decision making (see chart for details).     Patient presents for evaluation following a fall. No noted neurologic deficits. Fracture noted to the coccyx on CT. Orthopedic follow-up.  Prominent appendix noted on CT. Patient questioned about symptoms. She endorses periodic, mild, aching pain in the right pelvis for a number of months. She has been seen by OB/GYN for this issue. She had a normal colonoscopy about a year ago through [facility] gastroenterology. She denies fever, nausea, vomiting, diarrhea, or increasing intensity or frequency of pain. I do not think this patient has acute appendicitis. Gastroenterology follow-up as soon as possible on this matter.  The patient was given instructions for home care as well as return precautions. Patient voices understanding of these instructions, accepts the plan, and is  comfortable with discharge. Patient requests no pain medication stronger than tramadol.   Findings and plan of care discussed with Gareth Morgan, MD.   Vitals:   08/28/16 1503 08/28/16 1759  BP: 135/83 102/63  Pulse: 85 78  Resp: 16 18  Temp: 98.1 F (36.7 C)   TempSrc: Oral   SpO2: 95%      Final Clinical Impressions(s) / ED Diagnoses   Final diagnoses:  Fall, initial encounter  Fracture of coccyx, initial encounter for closed fracture [facility])    New Prescriptions Discharge Medication List as of 08/28/2016  5:42 PM    START taking these medications   Details  docusate sodium (COLACE) 100 MG capsule Take 1 capsule (100 mg total) by mouth every 12 (twelve) hours., Starting Sun 08/28/2016, Print    traMADol (ULTRAM) 50 MG tablet Take 1 tablet (50 mg total) by mouth every 6 (six) hours as needed., Starting Sun 08/28/2016, Print       I personally performed the services described in this documentation, which was scribed in my presence. The recorded information has been reviewed and is accurate.    Lorayne Bender, PA-C 08/31/16 1436    Gareth Morgan, MD 09/05/16 [phone]

## 2016-12-06 IMAGING — CR DG SHOULDER 2+V*R*
3 series · 3 of 3 positions shown · non-contrast
Comparison: None.

CLINICAL DATA: Pain.  No recent trauma.

EXAM:
RIGHT SHOULDER - 2+ VIEW

[w shoulder external right]
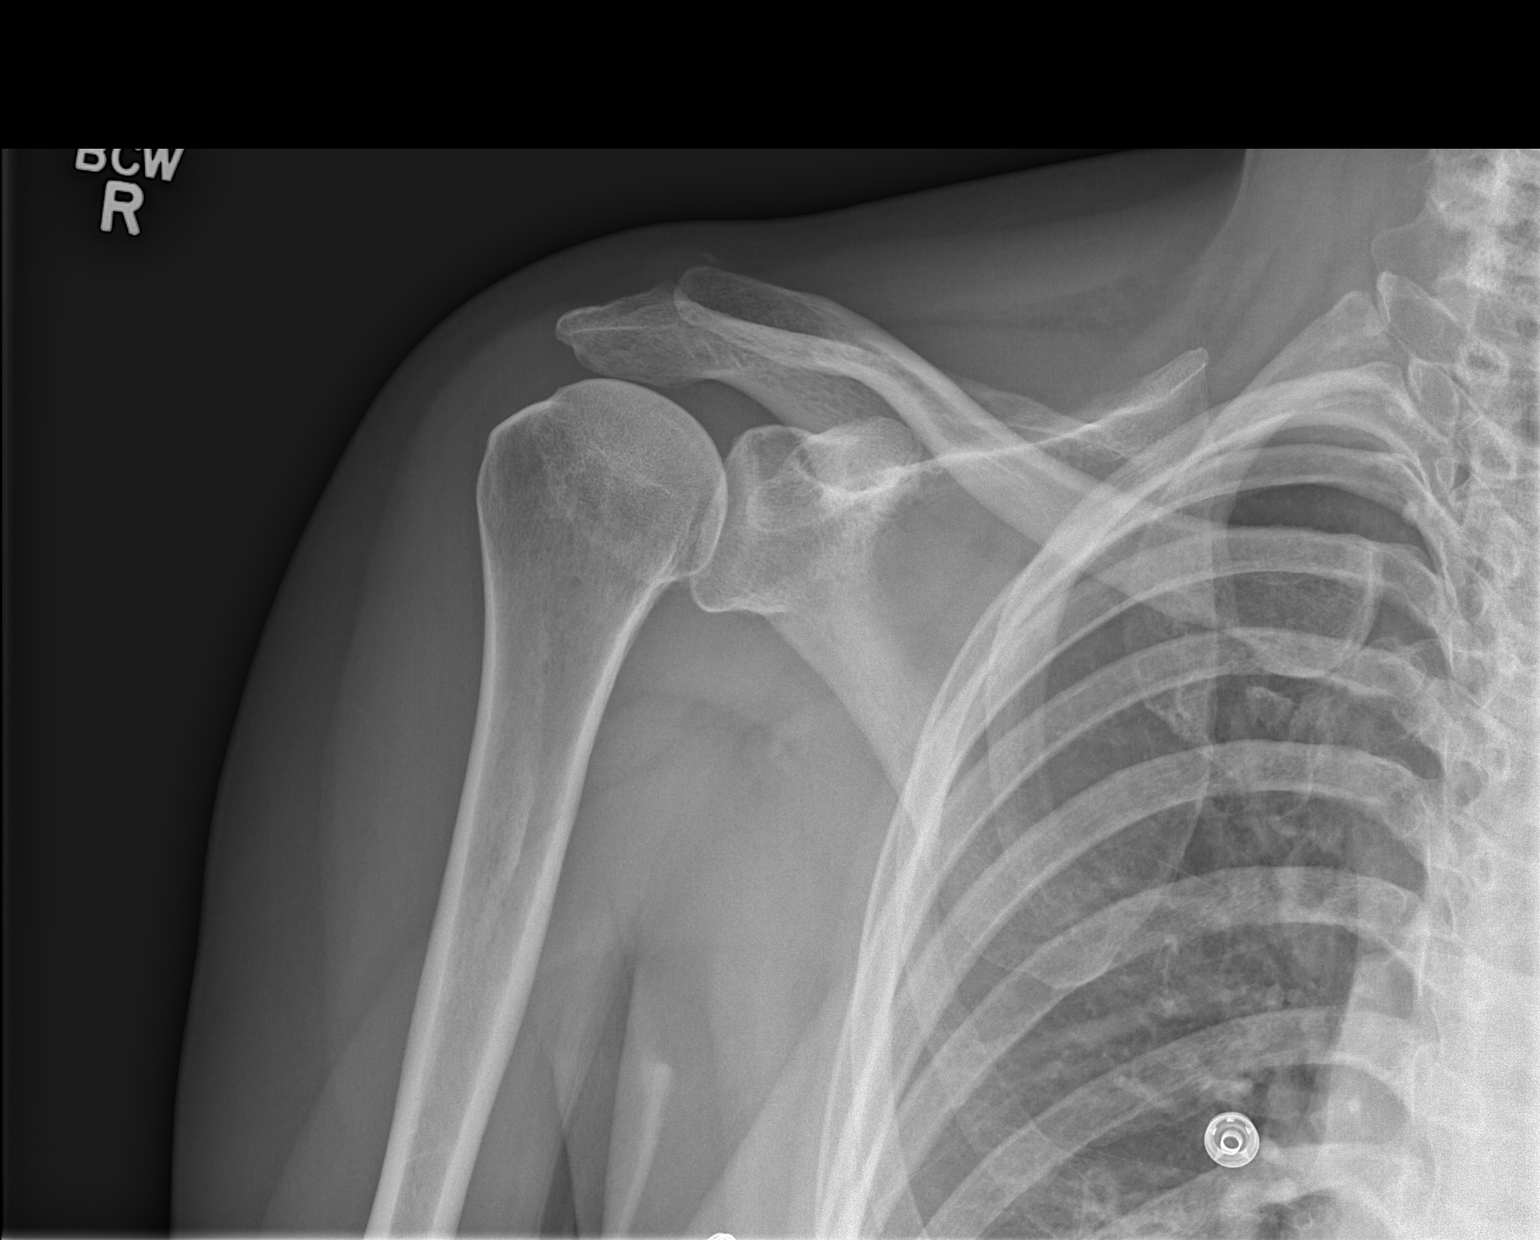

[w shoulder y-view right]
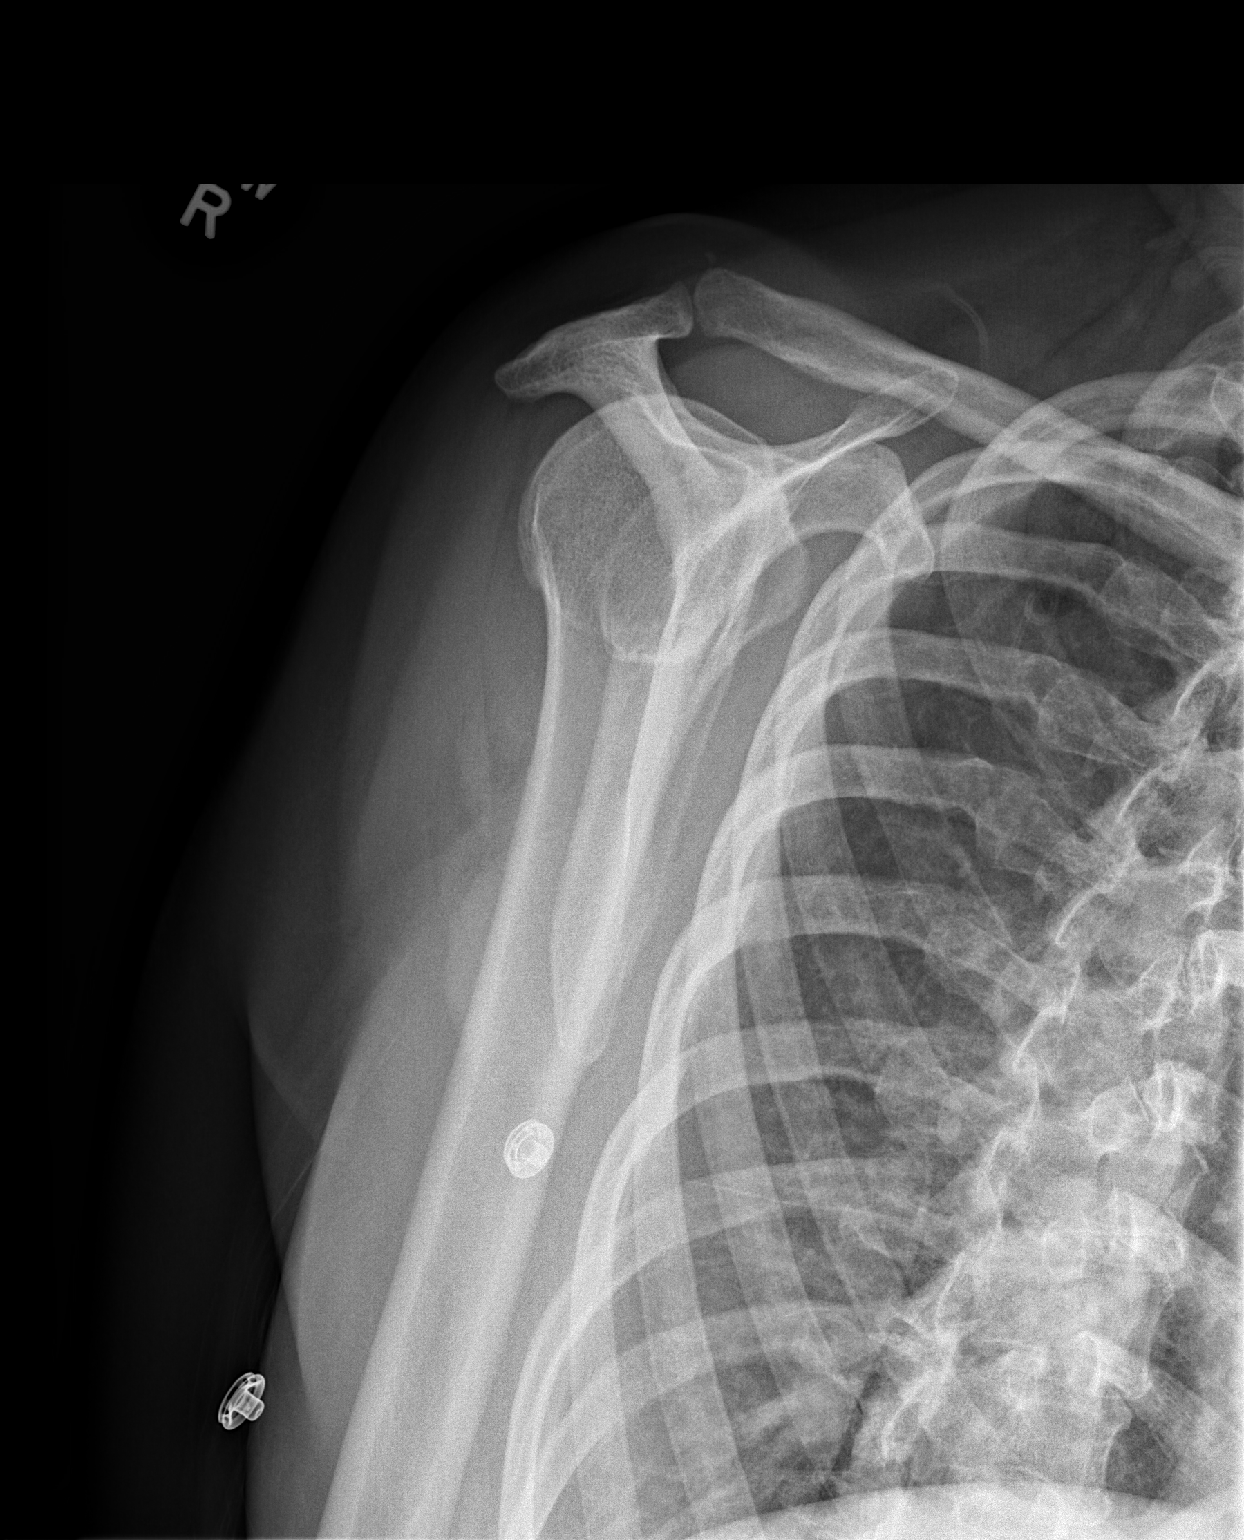

[x shoulder axillary right]
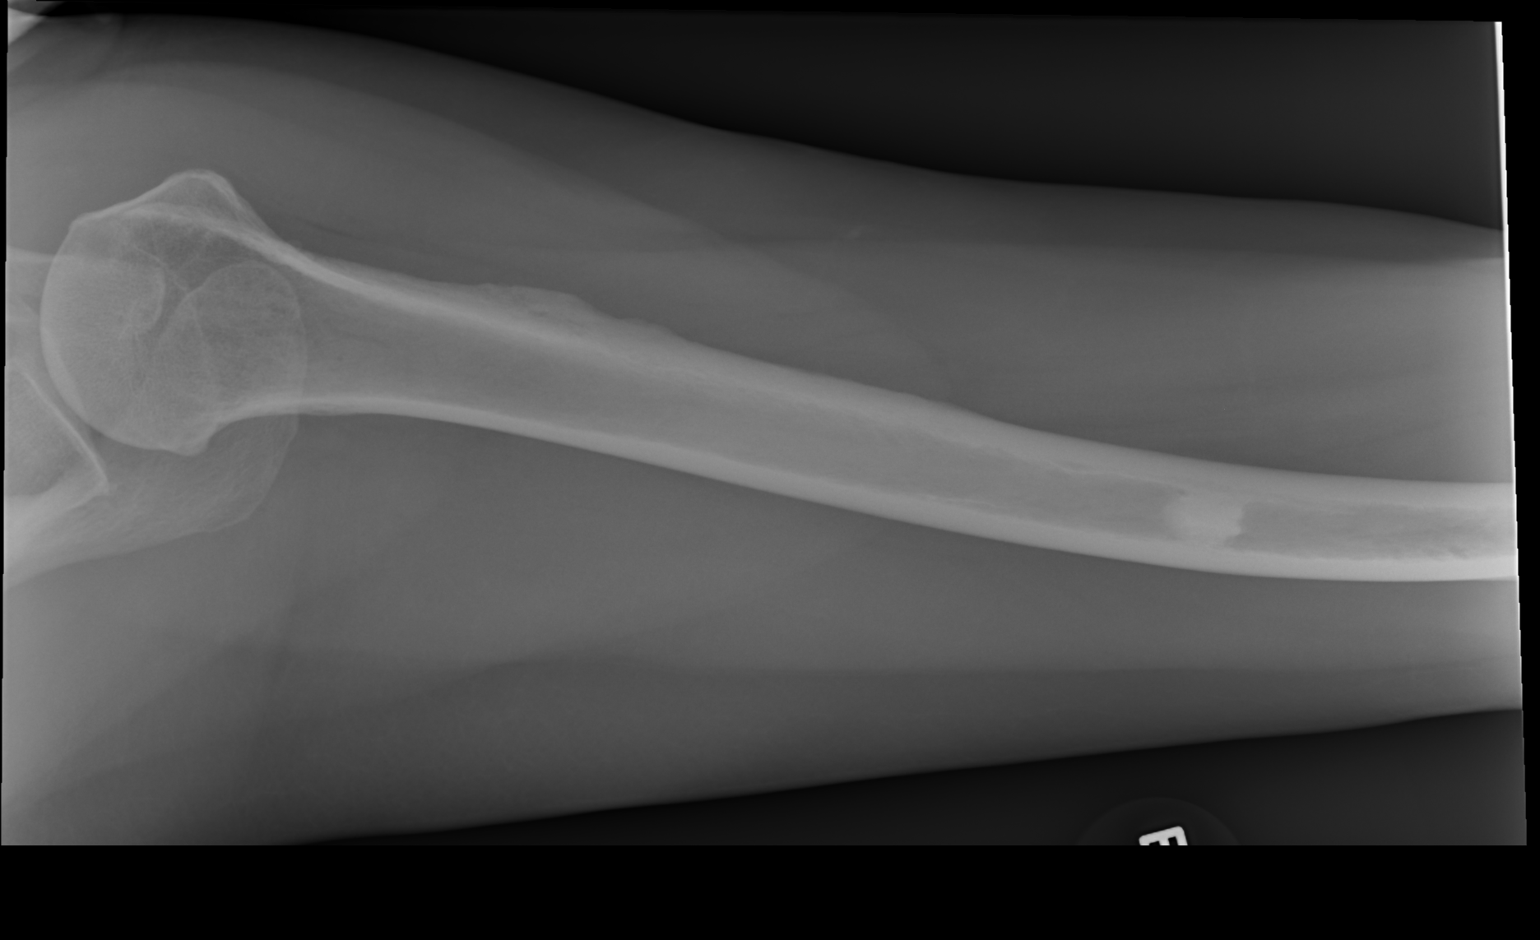

[3 of 3 positions shown; findings below may reference images not displayed]

FINDINGS: Frontal, Y scapular, and axillary views were obtained. There is no
demonstrable acute fracture or dislocation. A small calcification
noted superior to the lateral clavicle is well corticated and may
represent residua of old trauma or localized calcific tendinosis.
There is no appreciable joint space narrowing. There is a probable
bone island in the mid humerus seen on the axillary image.
IMPRESSION: Question old trauma versus calcific tendinosis just superior to the
lateral clavicle. No acute fracture or dislocation. No appreciable
joint space narrowing. No erosive change.

## 2018-05-19 ENCOUNTER — Other Ambulatory Visit: Payer: Self-pay

## 2018-05-19 ENCOUNTER — Emergency Department (HOSPITAL_COMMUNITY): Payer: Medicaid Other

## 2018-05-19 ENCOUNTER — Encounter (HOSPITAL_COMMUNITY): Payer: Self-pay

## 2018-05-19 ENCOUNTER — Emergency Department (HOSPITAL_COMMUNITY)
Admission: EM | Admit: 2018-05-19 | Discharge: 2018-05-19 | Disposition: A | Discharge status: Home / Self Care | Payer: Medicaid Other | Attending: Emergency Medicine | Admitting: Emergency Medicine

## 2018-05-19 ENCOUNTER — Emergency Department (HOSPITAL_BASED_OUTPATIENT_CLINIC_OR_DEPARTMENT_OTHER): Payer: Medicaid Other

## 2018-05-19 DIAGNOSIS — Z79899 Other long term (current) drug therapy: Secondary | ICD-10-CM | POA: Insufficient documentation

## 2018-05-19 DIAGNOSIS — N390 Urinary tract infection, site not specified: Secondary | ICD-10-CM | POA: Diagnosis not present

## 2018-05-19 DIAGNOSIS — Z794 Long term (current) use of insulin: Secondary | ICD-10-CM | POA: Diagnosis not present

## 2018-05-19 DIAGNOSIS — E1122 Type 2 diabetes mellitus with diabetic chronic kidney disease: Secondary | ICD-10-CM | POA: Diagnosis not present

## 2018-05-19 DIAGNOSIS — R52 Pain, unspecified: Secondary | ICD-10-CM

## 2018-05-19 DIAGNOSIS — R600 Localized edema: Secondary | ICD-10-CM

## 2018-05-19 DIAGNOSIS — N183 Chronic kidney disease, stage 3 (moderate): Secondary | ICD-10-CM | POA: Diagnosis not present

## 2018-05-19 DIAGNOSIS — R609 Edema, unspecified: Secondary | ICD-10-CM

## 2018-05-19 DIAGNOSIS — M7989 Other specified soft tissue disorders: Secondary | ICD-10-CM

## 2018-05-19 DIAGNOSIS — N12 Tubulo-interstitial nephritis, not specified as acute or chronic: Secondary | ICD-10-CM

## 2018-05-19 DIAGNOSIS — M5489 Other dorsalgia: Secondary | ICD-10-CM | POA: Diagnosis present

## 2018-05-19 DIAGNOSIS — Z7982 Long term (current) use of aspirin: Secondary | ICD-10-CM | POA: Diagnosis not present

## 2018-05-19 DIAGNOSIS — R1032 Left lower quadrant pain: Secondary | ICD-10-CM | POA: Diagnosis not present

## 2018-05-19 DIAGNOSIS — I129 Hypertensive chronic kidney disease with stage 1 through stage 4 chronic kidney disease, or unspecified chronic kidney disease: Secondary | ICD-10-CM | POA: Insufficient documentation

## 2018-05-19 DIAGNOSIS — N10 Acute tubulo-interstitial nephritis: Secondary | ICD-10-CM | POA: Diagnosis not present

## 2018-05-19 LAB — CBC WITH DIFFERENTIAL/PLATELET
Abs Immature Granulocytes: 0.01 10*3/uL (ref 0.00–0.07)
Basophils Absolute: 0 10*3/uL (ref 0.0–0.1)
Basophils Relative: 0 %
Eosinophils Absolute: 0 10*3/uL (ref 0.0–0.5)
Eosinophils Relative: 0 %
HCT: 36.2 % (ref 36.0–46.0)
Hemoglobin: 11.6 g/dL — ABNORMAL LOW (ref 12.0–15.0)
Immature Granulocytes: 0 %
Lymphocytes Relative: 28 %
Lymphs Abs: 1.6 10*3/uL (ref 0.7–4.0)
MCH: 31.2 pg (ref 26.0–34.0)
MCHC: 32 g/dL (ref 30.0–36.0)
MCV: 97.3 fL (ref 80.0–100.0)
Monocytes Absolute: 0.3 10*3/uL (ref 0.1–1.0)
Monocytes Relative: 6 %
Neutro Abs: 3.8 10*3/uL (ref 1.7–7.7)
Neutrophils Relative %: 66 %
Platelets: 177 10*3/uL (ref 150–400)
RBC: 3.72 MIL/uL — ABNORMAL LOW (ref 3.87–5.11)
RDW: 13 % (ref 11.5–15.5)
WBC: 5.8 10*3/uL (ref 4.0–10.5)
nRBC: 0 % (ref 0.0–0.2)

## 2018-05-19 LAB — URINALYSIS, ROUTINE W REFLEX MICROSCOPIC
Bilirubin Urine: NEGATIVE
Glucose, UA: NEGATIVE mg/dL
Hgb urine dipstick: NEGATIVE
Ketones, ur: 5 mg/dL — AB
Nitrite: NEGATIVE
Protein, ur: NEGATIVE mg/dL
Specific Gravity, Urine: 1.018 (ref 1.005–1.030)
pH: 5 (ref 5.0–8.0)

## 2018-05-19 LAB — COMPREHENSIVE METABOLIC PANEL
ALT: 18 U/L (ref 0–44)
AST: 23 U/L (ref 15–41)
Albumin: 4.6 g/dL (ref 3.5–5.0)
Alkaline Phosphatase: 99 U/L (ref 38–126)
Anion gap: 12 (ref 5–15)
BUN: 46 mg/dL — ABNORMAL HIGH (ref 8–23)
CO2: 24 mmol/L (ref 22–32)
Calcium: 9.7 mg/dL (ref 8.9–10.3)
Chloride: 100 mmol/L (ref 98–111)
Creatinine, Ser: 1.95 mg/dL — ABNORMAL HIGH (ref 0.44–1.00)
GFR calc Af Amer: 31 mL/min — ABNORMAL LOW (ref >60)
GFR calc non Af Amer: 27 mL/min — ABNORMAL LOW (ref >60)
Glucose, Bld: 142 mg/dL — ABNORMAL HIGH (ref 70–99)
Potassium: 4.3 mmol/L (ref 3.5–5.1)
Sodium: 136 mmol/L (ref 135–145)
Total Bilirubin: 0.8 mg/dL (ref 0.3–1.2)
Total Protein: 8.1 g/dL (ref 6.5–8.1)

## 2018-05-19 LAB — LIPASE, BLOOD: Lipase: 22 U/L (ref 11–51)

## 2018-05-19 LAB — BRAIN NATRIURETIC PEPTIDE: B Natriuretic Peptide: 57.6 pg/mL (ref 0.0–100.0)

## 2018-05-19 LAB — LACTIC ACID, PLASMA: Lactic Acid, Venous: 2.1 mmol/L (ref 0.5–1.9)

## 2018-05-19 MED ORDER — SODIUM CHLORIDE 0.9 % IV SOLN
1.0000 g | Freq: Once | INTRAVENOUS | Status: AC
  Administered 2018-05-19: 1 g via INTRAVENOUS
  Filled 2018-05-19: qty 10

## 2018-05-19 MED ORDER — CEPHALEXIN 250 MG PO CAPS: 250.0000 mg | ORAL_CAPSULE | Freq: Two times a day (BID) | ORAL | 0 refills | Status: AC

## 2018-05-19 NOTE — Progress Notes (Signed)
Bilateral lower extremity venous duplex completed. Refer to "CV Proc" under chart review to view preliminary results.  05/19/2018 5:35 PM Maudry Mayhew, MHA, RVT, RDCS, RDMS

## 2018-05-19 NOTE — Discharge Instructions (Addendum)
Your work-up today revealed evidence of pyelonephritis which is a urinary tract infection going to your kidneys.  I suspect this is why your left flank and back are hurting.  You received a dose of IV antibiotics today and will take Keflex twice a day for 10 days to treat the infection.  Please follow-up with your primary doctor in several days.  Your other imaging was reassuring as were your other labs.  Your kidney function was slightly worse than last time and the dosing was confirmed with pharmacy for your kidney function.  If any symptoms change or worsen, please return to the nearest emergency department.

## 2018-05-19 NOTE — ED Notes (Signed)
Bed: XF81 Expected date:  Expected time:  Means of arrival:  Comments: Fever, back pain

## 2018-05-19 NOTE — ED Notes (Signed)
CBC SENT TO LAB

## 2018-05-19 NOTE — ED Triage Notes (Addendum)
Per [facility]- Pt reside at home. Pt endorse no injury. Lower back pain, Rt leg swelling x 1 day. Denies urinary symptoms. Pt endorse back pain is worse with movement. Rt leg pain HX of swelling to rt leg however appears to be increased. Took pain medications however no relief. Denies N/V/D. Pt has had no  COVID exposure. Denies shortness of breath or cold like symptoms

## 2018-05-19 NOTE — ED Notes (Signed)
ED Provider at bedside.TEGELER 

## 2018-05-19 NOTE — ED Notes (Signed)
Date and time results received: 05/19/18 2:25 PM   Test: Lactic Critical Value: 2.1  Name of Provider Notified: Tegeler  Orders Received? Or Actions Taken?:

## 2018-05-19 NOTE — ED Notes (Signed)
Patient transported to CT 

## 2018-05-19 NOTE — ED Notes (Signed)
THIS WRITER CALLED EMS REGARDING PT'S HOME MEDICATIONS. EMS RESPONDED. PT'S HOME MEDICATIONS WERE LEFT AT HOME WITH HER DAUGHTER. PT WAS INFORMED OF EMS RESPONSE.

## 2018-05-19 NOTE — ED Provider Notes (Signed)
  Physical Exam  BP (!) 161/74   Pulse 94   Temp 98.6 F (37 C) (Oral)   Resp 17   SpO2 98%   Physical Exam  ED Course/Procedures     Procedures  MDM  Received patient in signout.  Ultrasound done and shows no DVT.  Will treat for pyelonephritis.  Patient will be discharged home patient states that her mother cannot come pick her up anymore.       Davonna Belling, MD 05/19/18 [phone]

## 2018-05-19 NOTE — ED Notes (Signed)
Lab called to report that the lavender tube that was sent was not labeled at all. Caryl Pina, RN made aware
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
## 2018-05-19 NOTE — ED Notes (Signed)
ED Provider at bedside. 

## 2018-05-20 LAB — URINE CULTURE: Culture: NO GROWTH

## 2020-07-19 ENCOUNTER — Other Ambulatory Visit: Payer: Self-pay

## 2020-07-19 ENCOUNTER — Encounter (HOSPITAL_COMMUNITY): Payer: Self-pay | Admitting: Emergency Medicine

## 2020-07-19 ENCOUNTER — Emergency Department (HOSPITAL_COMMUNITY)
Admission: EM | Admit: 2020-07-19 | Discharge: 2020-07-19 | Disposition: A | Discharge status: Home / Self Care | Payer: Medicaid Other | Attending: Emergency Medicine | Admitting: Emergency Medicine

## 2020-07-19 ENCOUNTER — Emergency Department (HOSPITAL_COMMUNITY): Payer: Medicaid Other

## 2020-07-19 DIAGNOSIS — R197 Diarrhea, unspecified: Secondary | ICD-10-CM | POA: Diagnosis not present

## 2020-07-19 DIAGNOSIS — I129 Hypertensive chronic kidney disease with stage 1 through stage 4 chronic kidney disease, or unspecified chronic kidney disease: Secondary | ICD-10-CM | POA: Diagnosis not present

## 2020-07-19 DIAGNOSIS — E1122 Type 2 diabetes mellitus with diabetic chronic kidney disease: Secondary | ICD-10-CM | POA: Insufficient documentation

## 2020-07-19 DIAGNOSIS — M533 Sacrococcygeal disorders, not elsewhere classified: Secondary | ICD-10-CM | POA: Diagnosis not present

## 2020-07-19 DIAGNOSIS — M25562 Pain in left knee: Secondary | ICD-10-CM | POA: Diagnosis not present

## 2020-07-19 DIAGNOSIS — M545 Low back pain, unspecified: Secondary | ICD-10-CM | POA: Diagnosis present

## 2020-07-19 DIAGNOSIS — Z79899 Other long term (current) drug therapy: Secondary | ICD-10-CM | POA: Insufficient documentation

## 2020-07-19 DIAGNOSIS — M25561 Pain in right knee: Secondary | ICD-10-CM | POA: Insufficient documentation

## 2020-07-19 DIAGNOSIS — G8929 Other chronic pain: Secondary | ICD-10-CM | POA: Diagnosis not present

## 2020-07-19 DIAGNOSIS — N183 Chronic kidney disease, stage 3 unspecified: Secondary | ICD-10-CM | POA: Diagnosis not present

## 2020-07-19 DIAGNOSIS — Z794 Long term (current) use of insulin: Secondary | ICD-10-CM | POA: Diagnosis not present

## 2020-07-19 LAB — COMPREHENSIVE METABOLIC PANEL
ALT: 16 U/L (ref 0–44)
AST: 26 U/L (ref 15–41)
Albumin: 4.3 g/dL (ref 3.5–5.0)
Alkaline Phosphatase: 103 U/L (ref 38–126)
Anion gap: 15 (ref 5–15)
BUN: 27 mg/dL — ABNORMAL HIGH (ref 8–23)
CO2: 25 mmol/L (ref 22–32)
Calcium: 8.3 mg/dL — ABNORMAL LOW (ref 8.9–10.3)
Chloride: 94 mmol/L — ABNORMAL LOW (ref 98–111)
Creatinine, Ser: 1.56 mg/dL — ABNORMAL HIGH (ref 0.44–1.00)
GFR, Estimated: 37 mL/min — ABNORMAL LOW (ref >60)
Glucose, Bld: 238 mg/dL — ABNORMAL HIGH (ref 70–99)
Potassium: 3.6 mmol/L (ref 3.5–5.1)
Sodium: 134 mmol/L — ABNORMAL LOW (ref 135–145)
Total Bilirubin: 1 mg/dL (ref 0.3–1.2)
Total Protein: 7.8 g/dL (ref 6.5–8.1)

## 2020-07-19 LAB — CBC WITH DIFFERENTIAL/PLATELET
Abs Immature Granulocytes: 0.06 10*3/uL (ref 0.00–0.07)
Basophils Absolute: 0 10*3/uL (ref 0.0–0.1)
Basophils Relative: 0 %
Eosinophils Absolute: 0 10*3/uL (ref 0.0–0.5)
Eosinophils Relative: 0 %
HCT: 40.4 % (ref 36.0–46.0)
Hemoglobin: 13.6 g/dL (ref 12.0–15.0)
Immature Granulocytes: 1 %
Lymphocytes Relative: 28 %
Lymphs Abs: 1.6 10*3/uL (ref 0.7–4.0)
MCH: 30.8 pg (ref 26.0–34.0)
MCHC: 33.7 g/dL (ref 30.0–36.0)
MCV: 91.4 fL (ref 80.0–100.0)
Monocytes Absolute: 0.5 10*3/uL (ref 0.1–1.0)
Monocytes Relative: 9 %
Neutro Abs: 3.4 10*3/uL (ref 1.7–7.7)
Neutrophils Relative %: 62 %
Platelets: 224 10*3/uL (ref 150–400)
RBC: 4.42 MIL/uL (ref 3.87–5.11)
RDW: 13.5 % (ref 11.5–15.5)
WBC: 5.6 10*3/uL (ref 4.0–10.5)
nRBC: 0 % (ref 0.0–0.2)

## 2020-07-19 MED ORDER — OXYCODONE-ACETAMINOPHEN 5-325 MG PO TABS
1.0000 | ORAL_TABLET | Freq: Once | ORAL | Status: AC
  Administered 2020-07-19: 1 via ORAL
  Filled 2020-07-19: qty 1

## 2020-07-19 MED ORDER — SODIUM CHLORIDE 0.9 % IV BOLUS
1000.0000 mL | Freq: Once | INTRAVENOUS | Status: AC
  Administered 2020-07-19: 1000 mL via INTRAVENOUS

## 2020-07-19 NOTE — ED Provider Notes (Signed)
[facility] DE[facility] Provider Note   CSN: [id number] Arrival date & time: 07/19/20  0944     History Chief Complaint  Patient presents with   Cough   Knee Pain    [name] is a 65 y.o. female.  HPI  Patient is a 65 year old female who presents to the emergency department due to low back pain.  She reports a history of fracture to the coccyx and states that she has been sitting more recently and due to this is now having pain in the region.  Pain worsens when sitting.  She states she has a hard time getting comfortable.  She has been taking Tylenol with minimal relief.  Denies any urinary changes, weakness, numbness, abdominal pain, vomiting.  She notes intermittent diarrhea. She also reports bilateral chronic knee pain. She states she was "supposed to get them replaced a long time ago". Denies it has acutely worsened.   Patient also recently evaluated by urgent care as well as primary care for possible bronchitis.  She was started on [employer] as well as doxycycline.  She notes moderate improvement in her symptoms.  No chest pain or shortness of breath.     Past Medical History:  Diagnosis Date   Anemia    due to menorrhagia, BL 8-10   Anxiety    Bilateral renal cysts 01/15/2009   Qualifier: Diagnosis of  By: Tyrell Antonio MD, [place]     Chronic abdominal pain    Chronic back pain    CKD (chronic kidney disease) stage 3, GFR 30-59 ml/min (HCC)    baseline creatinine 1.4-1.7   Colitis    Congenital heart defect    surgically corrected as a child   DDD (degenerative disc disease), lumbar    Depression    Diabetes mellitus type II, uncontrolled ([place])    Diabetes mellitus without complication ([place])    History of palpitations    evaluated recently 12'15   Hyperlipidemia    Hypertension    IBS (irritable bowel syndrome)    Ovarian cyst, left    Postmenopausal bleeding 06/12/2008   UTI (lower urinary tract infection)    Vaginal cyst     nabothian and bartholin    Patient Active Problem List   Diagnosis Date Noted   Right hip pain 11/30/2015   Weight gain 10/19/2015   Osteoarthritis of left knee 06/29/2015   Bicornuate uterus 06/22/2015   RLQ abdominal pain 06/12/2015   Hyperkalemia 04/27/2015   Recurrent UTI (urinary tract infection) 12/24/2014   Hyponatremia 09/12/2014   GERD (gastroesophageal reflux disease) 01/06/2014   Urge incontinence 11/26/2013   Chronic pain 09/13/2012   Preventative health care 11/03/2010   Bilateral renal cysts 01/15/2009   Anxiety state 06/12/2008   CKD (chronic kidney disease) stage 3, GFR 30-59 ml/min ([place]) 06/12/2008   Diabetes mellitus with stage 3 chronic kidney disease ([place]) 12/28/2005   Hyperlipidemia associated with type 2 diabetes mellitus ([place]) 12/28/2005   Hypertension associated with diabetes ([place]) 12/28/2005    Past Surgical History:  Procedure Laterality Date   CARDIAC SURGERY     to repair congenital defect as a child- 54months old   COLONOSCOPY WITH PROPOFOL N/A 10/01/2014   Procedure: COLONOSCOPY WITH PROPOFOL;  Surgeon: Arta Silence, MD;  Location: [facility];  Service: Endoscopy;  Laterality: N/A;   ESOPHAGOGASTRODUODENOSCOPY (EGD) WITH PROPOFOL N/A 10/01/2014   Procedure: ESOPHAGOGASTRODUODENOSCOPY (EGD) WITH PROPOFOL;  Surgeon: Arta Silence, MD;  Location: [facility];  Service: Endoscopy;  Laterality: N/A;  OB History     Gravida  3   Para  1   Term  1   Preterm      AB  2   Living  1      SAB  2   IAB      Ectopic      Multiple      Live Births  1           Family History  Problem Relation Age of Onset   Stroke Father    Heart attack Father        Had MI in his 60s   Stomach cancer Paternal Grandmother    Diabetes Maternal Grandmother    Cerebral palsy Daughter    Anesthesia problems Neg Hx    Hypotension Neg Hx    Malignant hyperthermia Neg Hx    Pseudochol deficiency Neg Hx     Social History   Tobacco Use    Smoking status: Never   Smokeless tobacco: Never  Substance Use Topics   Alcohol use: No    Alcohol/week: 0.0 standard drinks   Drug use: No    Home Medications Prior to Admission medications   Medication Sig Start Date End Date Taking? Authorizing Provider  acetaminophen (TYLENOL) 500 MG tablet Take 1,000 mg by mouth every 6 (six) hours as needed for moderate pain.    [provider]  acetaminophen (TYLENOL) 650 MG CR tablet Take 1,300 mg by mouth 2 (two) times daily as needed for pain (takes twice daily).     [provider]  azithromycin (ZITHROMAX Z-PAK) 250 MG tablet Take 2 pills today, then 1 pill daily until gone. Patient not taking: Reported on 05/19/2018 12/23/15   Melony Overly, MD  chlorpheniramine-HYDROcodone [facility]) 10-8 MG/5ML SUER Take 5 mLs by mouth every 12 (twelve) hours as needed for cough. Patient not taking: Reported on 05/19/2018 12/23/15   Melony Overly, MD  docusate sodium (COLACE) 100 MG capsule Take 1 capsule (100 mg total) by mouth every 12 (twelve) hours. Patient not taking: Reported on 05/19/2018 08/28/16   Arlean Hopping C, PA-C  enalapril (VASOTEC) 10 MG tablet Take 1 tablet (10 mg total) by mouth daily. 10/19/15   Asencion Partridge, MD  gabapentin (NEURONTIN) 300 MG capsule Take 1 capsule (300 mg total) by mouth 3 (three) times daily. Patient taking differently: Take 300 mg by mouth 2 (two) times daily.  03/08/16   Gerda Diss, DO  Insulin Glargine (LANTUS SOLOSTAR) 100 UNIT/ML Solostar Pen Take 12 units in the morning and 12 units in the evening. Patient taking differently: Inject 18 Units into the skin 2 (two) times daily.  11/03/15   Lorella Nimrod, MD  oxybutynin (DITROPAN XL) 10 MG 24 hr tablet Take 1 tablet (10 mg total) by mouth at bedtime. Patient not taking: Reported on 05/19/2018 12/19/15   Rasch, Anderson Malta I, NP  oxybutynin (DITROPAN XL) 10 MG 24 hr tablet Take 1 tablet (10 mg total) by mouth at bedtime. Patient not taking:  Reported on 05/19/2018 12/19/15   Rasch, Anderson Malta I, NP  predniSONE (DELTASONE) 50 MG tablet Take 1 pill daily for 5 days. Patient not taking: Reported on 05/19/2018 12/23/15   Melony Overly, MD  traMADol (ULTRAM) 50 MG tablet Take 1 tablet (50 mg total) by mouth every 6 (six) hours as needed. Patient not taking: Reported on 05/19/2018 08/28/16   Arlean Hopping C, PA-C  trolamine salicylate (ASPERCREME) 10 % cream Apply 1 application  topically 2 (two) times daily as needed for muscle pain.    [provider]  pantoprazole (PROTONIX) 20 MG tablet Take 2 tablets (40 mg total) by mouth daily. 01/29/11 04/28/11  Billy Fischer, MD  sertraline (ZOLOFT) 100 MG tablet Take 1 tablet (100 mg total) by mouth daily. 02/10/11 04/28/11  Jenelle Mages, MD    Allergies    Patient has no known allergies.  Review of Systems   Review of Systems  Constitutional:  Negative for fever.  Respiratory:  Negative for shortness of breath.   Cardiovascular:  Negative for chest pain.  Gastrointestinal:  Positive for diarrhea and nausea. Negative for vomiting.  Musculoskeletal:  Positive for back pain.  Skin:  Negative for wound.  Neurological:  Negative for weakness and numbness.   Physical Exam Updated Vital Signs BP (!) 147/90 (BP Location: Right Arm)   Pulse 98   Temp 97.9 F (36.6 C)   Resp 18   SpO2 97%   Physical Exam Vitals and nursing note reviewed.  Constitutional:      General: She is not in acute distress.    Appearance: Normal appearance. She is not ill-appearing, toxic-appearing or diaphoretic.  HENT:     Head: Normocephalic and atraumatic.     Right Ear: External ear normal.     Left Ear: External ear normal.     Nose: Nose normal.     Mouth/Throat:     Mouth: Mucous membranes are moist.     Pharynx: Oropharynx is clear. No oropharyngeal exudate or posterior oropharyngeal erythema.  Eyes:     General: No scleral icterus.       Right eye: No discharge.        Left eye: No  discharge.     Extraocular Movements: Extraocular movements intact.     Conjunctiva/sclera: Conjunctivae normal.  Cardiovascular:     Rate and Rhythm: Normal rate.     Pulses: Normal pulses.  Pulmonary:     Effort: Pulmonary effort is normal.  Abdominal:     General: Abdomen is flat.     Tenderness: There is no abdominal tenderness.     Comments: Protuberant abdomen that is soft and non tender.  Genitourinary:    Comments: Female nursing chaperone present.  Normal-appearing anal region.  Small nonthrombosed external hemorrhoid noted at the 5 o'clock position.  No overlying skin changes.  No palpable fluctuance.  No tenderness noted in the region. Musculoskeletal:        General: Tenderness present. Normal range of motion.     Cervical back: Normal range of motion and neck supple. No tenderness.     Comments: Mild tenderness overlying the coccyx.  No overlying skin changes.  No palpable induration or fluctuance.  Mild tenderness noted diffusely along the bilateral knees.  Arthritic appearing.  No overlying skin changes.  No erythema.  No increased warmth.   Skin:    General: Skin is warm and dry.  Neurological:     General: No focal deficit present.     Mental Status: She is alert and oriented to person, place, and time.     Comments: Moving all 4 extremities with ease.  No gross deficits.  A&O x3.  Psychiatric:        Mood and Affect: Mood normal.        Behavior: Behavior normal.   ED Results / Procedures / Treatments   Labs (all labs ordered are listed, but only abnormal results are displayed) Labs Reviewed  COMPREHENSIVE METABOLIC PANEL - Abnormal; Notable for the following components:      Result Value   Sodium 134 (*)    Chloride 94 (*)    Glucose, Bld 238 (*)    BUN 27 (*)    Creatinine, Ser 1.56 (*)    Calcium 8.3 (*)    GFR, Estimated 37 (*)    All other components within normal limits  CBC WITH DIFFERENTIAL/PLATELET    EKG None  Radiology DG  Sacrum/Coccyx  Result Date: 07/19/2020 CLINICAL DATA:  pain over the coccyx; history of fracture per patient EXAM: SACRUM AND COCCYX - 2+ VIEW COMPARISON:  CT 05/19/2018 FINDINGS: There is no evidence of acute fracture. There is lower lumbar spine degenerative changes most prominent at L4-L5. Mild bilateral hip and SI joint degenerative changes. IMPRESSION: No radiographically evident acute fracture. Electronically Signed   By: Maurine Simmering   On: 07/19/2020 12:05    Procedures Procedures   Medications Ordered in ED Medications  oxyCODONE-acetaminophen (PERCOCET/ROXICET) 5-325 MG per tablet 1 tablet (1 tablet Oral Given 07/19/20 1054)  sodium chloride 0.9 % bolus 1,000 mL (0 mLs Intravenous Stopped 07/19/20 1554)    ED Course  I have reviewed the triage vital signs and the nursing notes.  Pertinent labs & imaging results that were available during my care of the patient were reviewed by me and considered in my medical decision making (see chart for details).  Clinical Course as of 07/19/20 1619  Sun Jul 19, 2020  1208 Patient reassessed.  Notes moderate resolution of her pain.  She states that she was told by EMS that she was "dehydrated".  Patient insist that she receives IV fluids at this visit.  We will give patient IV fluids as well as obtain basic labs. [LJ]    Clinical Course User Index [LJ] Rayna Sexton, PA-C   MDM Rules/Calculators/A&P                          Pt is a 65 y.o. female who presents to the emergency department due to chronic bilateral knee pain as well as pain overlying her coccyx.  Imaging: X-ray of the sacrum/coccyx shows no radiographically evident acute fracture.  Labs: CBC without abnormalities. CMP with a sodium of 134, chloride of 94, glucose of 238, BUN of 27, creatinine of 1.56, calcium of 8.3, GFR 37.  I, Rayna Sexton, PA-C, personally reviewed and evaluated these images and lab results as part of my medical decision-making.  Patient evaluated  for pain of her coccyx as well as chronic bilateral knee pain.  No new falls or trauma to the knees.  She states that she has been sitting more recently prior to the onset of her coccyx pain.  She was given a dose of Percocet with moderate relief in the ED.  She then requested IV fluids.  I also obtained basic lab work.  Her labs are generally reassuring.  CBC without leukocytosis.  CMP with mild hyponatremia and hypochloremia.  She was given 1 L of normal saline.  Kidney function similar to prior baseline values.  Glucose elevated at 238.  No anion gap.  No systemic symptoms.  Doubt DKA.  Physical exam also reassuring.  No overlying skin changes over the coccyx or in the anal region.  No fluctuance or signs of abscess.  Feel the patient is stable for discharge at this time.  She has contacted a relative that is coming to give her a  ride.  Given strict return precautions.    Note: Portions of this report may have been transcribed using voice recognition software. Every effort was made to ensure accuracy; however, inadvertent computerized transcription errors may be present.   Final Clinical Impression(s) / ED Diagnoses Final diagnoses:  Coccyx pain  Chronic pain of both knees   Rx / DC Orders ED Discharge Orders     None        Rayna Sexton, PA-C 07/19/20 1620    Fredia Sorrow, MD 07/22/20 1627

## 2020-07-19 NOTE — ED Triage Notes (Addendum)
Per EMS, patient from home, started on doxy two days ago at [facility] for suspected bronchitis. Reportedly refused chest XR at [facility]. C/o continued cough and chronic knee and tailbone pain. Ambulatory.

## 2020-07-19 NOTE — Discharge Instructions (Addendum)
Please follow-up with your orthopedic doctor regarding your knee pain.  I would also recommend following up with your regular doctor.  If you develop any new or worsening symptoms, please come to the emergency department for reevaluation.  It was a pleasure to meet you

## 2020-10-25 ENCOUNTER — Encounter: Payer: Self-pay | Admitting: Emergency Medicine

## 2020-10-25 ENCOUNTER — Other Ambulatory Visit: Payer: Self-pay

## 2020-10-25 ENCOUNTER — Emergency Department: Payer: No Typology Code available for payment source

## 2020-10-25 ENCOUNTER — Emergency Department
Admission: EM | Admit: 2020-10-25 | Discharge: 2020-10-25 | Disposition: A | Discharge status: Home / Self Care | Payer: No Typology Code available for payment source | Attending: Emergency Medicine | Admitting: Emergency Medicine

## 2020-10-25 DIAGNOSIS — N183 Chronic kidney disease, stage 3 unspecified: Secondary | ICD-10-CM | POA: Diagnosis not present

## 2020-10-25 DIAGNOSIS — Z794 Long term (current) use of insulin: Secondary | ICD-10-CM | POA: Diagnosis not present

## 2020-10-25 DIAGNOSIS — E1122 Type 2 diabetes mellitus with diabetic chronic kidney disease: Secondary | ICD-10-CM | POA: Diagnosis not present

## 2020-10-25 DIAGNOSIS — R7309 Other abnormal glucose: Secondary | ICD-10-CM | POA: Insufficient documentation

## 2020-10-25 DIAGNOSIS — I129 Hypertensive chronic kidney disease with stage 1 through stage 4 chronic kidney disease, or unspecified chronic kidney disease: Secondary | ICD-10-CM | POA: Insufficient documentation

## 2020-10-25 DIAGNOSIS — S0990XA Unspecified injury of head, initial encounter: Secondary | ICD-10-CM | POA: Insufficient documentation

## 2020-10-25 DIAGNOSIS — Y92410 Unspecified street and highway as the place of occurrence of the external cause: Secondary | ICD-10-CM | POA: Diagnosis not present

## 2020-10-25 DIAGNOSIS — Z79899 Other long term (current) drug therapy: Secondary | ICD-10-CM | POA: Diagnosis not present

## 2020-10-25 LAB — CBG MONITORING, ED: Glucose-Capillary: 190 mg/dL — ABNORMAL HIGH (ref 70–99)
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
## 2020-10-25 NOTE — ED Notes (Signed)
Patient wheeled out to wait for ride. Refused repeat vitals.
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
## 2021-01-14 ENCOUNTER — Emergency Department (HOSPITAL_BASED_OUTPATIENT_CLINIC_OR_DEPARTMENT_OTHER)
Admission: EM | Admit: 2021-01-14 | Discharge: 2021-01-14 | Disposition: A | Discharge status: Home / Self Care | Payer: Medicaid Other | Attending: Emergency Medicine | Admitting: Emergency Medicine

## 2021-01-14 ENCOUNTER — Other Ambulatory Visit: Payer: Self-pay

## 2021-01-14 ENCOUNTER — Encounter (HOSPITAL_BASED_OUTPATIENT_CLINIC_OR_DEPARTMENT_OTHER): Payer: Self-pay | Admitting: *Deleted

## 2021-01-14 DIAGNOSIS — Z794 Long term (current) use of insulin: Secondary | ICD-10-CM | POA: Diagnosis not present

## 2021-01-14 DIAGNOSIS — E1122 Type 2 diabetes mellitus with diabetic chronic kidney disease: Secondary | ICD-10-CM | POA: Diagnosis not present

## 2021-01-14 DIAGNOSIS — N183 Chronic kidney disease, stage 3 unspecified: Secondary | ICD-10-CM | POA: Insufficient documentation

## 2021-01-14 DIAGNOSIS — N390 Urinary tract infection, site not specified: Secondary | ICD-10-CM | POA: Diagnosis not present

## 2021-01-14 DIAGNOSIS — G8929 Other chronic pain: Secondary | ICD-10-CM

## 2021-01-14 DIAGNOSIS — I129 Hypertensive chronic kidney disease with stage 1 through stage 4 chronic kidney disease, or unspecified chronic kidney disease: Secondary | ICD-10-CM | POA: Diagnosis not present

## 2021-01-14 DIAGNOSIS — M545 Low back pain, unspecified: Secondary | ICD-10-CM | POA: Insufficient documentation

## 2021-01-14 LAB — URINALYSIS, ROUTINE W REFLEX MICROSCOPIC
Bilirubin Urine: NEGATIVE
Glucose, UA: NEGATIVE mg/dL
Hgb urine dipstick: NEGATIVE
Ketones, ur: NEGATIVE mg/dL
Nitrite: NEGATIVE
Protein, ur: 30 mg/dL — AB
Specific Gravity, Urine: 1.018 (ref 1.005–1.030)
pH: 5.5 (ref 5.0–8.0)

## 2021-01-14 MED ORDER — OXYCODONE HCL 5 MG PO TABS: 5.0000 mg | ORAL_TABLET | Freq: Once | ORAL | Status: DC

## 2021-01-14 MED ORDER — TRAMADOL HCL 50 MG PO TABS
50.0000 mg | ORAL_TABLET | Freq: Once | ORAL | Status: AC
  Administered 2021-01-14: 50 mg via ORAL
  Filled 2021-01-14: qty 1

## 2021-01-14 MED ORDER — LIDOCAINE 5 % EX PTCH: 1.0000 | MEDICATED_PATCH | CUTANEOUS | 0 refills | Status: AC

## 2021-01-14 MED ORDER — ACETAMINOPHEN 500 MG PO TABS
1000.0000 mg | ORAL_TABLET | Freq: Once | ORAL | Status: AC
  Administered 2021-01-14: 1000 mg via ORAL
  Filled 2021-01-14: qty 2

## 2021-01-14 MED ORDER — LIDOCAINE 5 % EX PTCH
1.0000 | MEDICATED_PATCH | CUTANEOUS | Status: DC
  Administered 2021-01-14: 1 via TRANSDERMAL
  Filled 2021-01-14: qty 1

## 2021-01-14 NOTE — ED Notes (Signed)
Patient informed of Need for Urine Specimen. Patient to attempt to urinate shortly. External Urinary Catheter placed. Staff will Monitor.

## 2021-01-14 NOTE — Discharge Instructions (Addendum)
Apply lidocaine patches to the back as needed to help with the pain. Take Tylenol 650 mg every 8 hours as needed for pain.  You can take up to 3000 mg a day.  Follow-up with your primary care doctor tomorrow or early next week for reevaluation.  If you are still having pain they may have other advice.

## 2021-01-14 NOTE — ED Notes (Signed)
Patient was able to obtain Transportation Home through a Friend. Patient discharged with Friend to Home in Wheelchair.

## 2021-01-14 NOTE — ED Notes (Signed)
PA at Bedside.

## 2021-01-14 NOTE — ED Notes (Signed)
Per [facility]: Patient has been having worsening Lower Back for approximately 2-3 days. Pain radiates from lower Back to ABD.   VSS with EMS: 170/80 90 Pulse 17RR 98 SPO2 CBG 337

## 2021-01-14 NOTE — ED Triage Notes (Signed)
BIB EMS, lower back pain since Tuesday night. Pt denies injury to back. Limited walking due to the pain.

## 2021-01-14 NOTE — ED Notes (Signed)
RN provided AVS using Teachback Method. Patient verbalizes understanding of Discharge Instructions. Opportunity for Questioning and Answers were provided by RN. Patient is now awaiting PTAR for Transportation to Home.

## 2021-01-14 NOTE — ED Provider Notes (Signed)
[facility] EMERGENCY DEPT Provider Note   CSN: [id number] Arrival date & time: 01/14/21  1239     History Chief Complaint  Patient presents with   Back Pain    [name] is a 65 y.o. female.   Back Pain Associated symptoms: no dysuria, no numbness and no weakness    Patient with history of chronic back pain, CKD, diabetes type 2, anxiety, bilateral renal cysts, recurrent UTIs presents due to low back pain.  It started acutely on Tuesday, states it happened after taking a laxative and twisting on the toilet.  The pain is on the sides of her back, and was initially in the middle but now is on the sides.  She does not feel it at rest, only feels it with certain movement.  Does not radiate down her legs, sometimes it radiates down her flanks.  Denies any dysuria or hematuria, does states she is having increased urinary frequency and urgency although at baseline she suffers from some urinary incontinence.  She is try taking Tylenol arthritis once in the morning for the last 2 days without any improvement of symptoms.  No history of spinal procedures, no malignancy, no IV drug use, no saddle anesthesia, no urinary retention, no bilateral leg numbness or weakness.  Past Medical History:  Diagnosis Date   Anemia    due to menorrhagia, BL 8-10   Anxiety    Bilateral renal cysts 01/15/2009   Qualifier: Diagnosis of  By: Tyrell Antonio MD, [place]     Chronic abdominal pain    Chronic back pain    CKD (chronic kidney disease) stage 3, GFR 30-59 ml/min (HCC)    baseline creatinine 1.4-1.7   Colitis    Congenital heart defect    surgically corrected as a child   DDD (degenerative disc disease), lumbar    Depression    Diabetes mellitus type II, uncontrolled    Diabetes mellitus without complication ([place])    History of palpitations    evaluated recently 12'15   Hyperlipidemia    Hypertension    IBS (irritable bowel syndrome)    Ovarian cyst, left    Postmenopausal  bleeding 06/12/2008   UTI (lower urinary tract infection)    Vaginal cyst    nabothian and bartholin    Patient Active Problem List   Diagnosis Date Noted   Right hip pain 11/30/2015   Weight gain 10/19/2015   Osteoarthritis of left knee 06/29/2015   Bicornuate uterus 06/22/2015   RLQ abdominal pain 06/12/2015   Hyperkalemia 04/27/2015   Recurrent UTI (urinary tract infection) 12/24/2014   Hyponatremia 09/12/2014   GERD (gastroesophageal reflux disease) 01/06/2014   Urge incontinence 11/26/2013   Chronic pain 09/13/2012   Preventative health care 11/03/2010   Bilateral renal cysts 01/15/2009   Anxiety state 06/12/2008   CKD (chronic kidney disease) stage 3, GFR 30-59 ml/min ([place]) 06/12/2008   Diabetes mellitus with stage 3 chronic kidney disease ([place]) 12/28/2005   Hyperlipidemia associated with type 2 diabetes mellitus ([place]) 12/28/2005   Hypertension associated with diabetes ([place]) 12/28/2005    Past Surgical History:  Procedure Laterality Date   CARDIAC SURGERY     to repair congenital defect as a child- 35months old   COLONOSCOPY WITH PROPOFOL N/A 10/01/2014   Procedure: COLONOSCOPY WITH PROPOFOL;  Surgeon: Arta Silence, MD;  Location: [facility];  Service: Endoscopy;  Laterality: N/A;   ESOPHAGOGASTRODUODENOSCOPY (EGD) WITH PROPOFOL N/A 10/01/2014   Procedure: ESOPHAGOGASTRODUODENOSCOPY (EGD) WITH PROPOFOL;  Surgeon: Arta Silence, MD;  Location: [facility];  Service: Endoscopy;  Laterality: N/A;     OB History     Gravida  3   Para  1   Term  1   Preterm      AB  2   Living  1      SAB  2   IAB      Ectopic      Multiple      Live Births  1           Family History  Problem Relation Age of Onset   Stroke Father    Heart attack Father        Had MI in his 78s   Stomach cancer Paternal Grandmother    Diabetes Maternal Grandmother    Cerebral palsy Daughter    Anesthesia problems Neg Hx    Hypotension Neg Hx    Malignant hyperthermia  Neg Hx    Pseudochol deficiency Neg Hx     Social History   Tobacco Use   Smoking status: Never   Smokeless tobacco: Never  Substance Use Topics   Alcohol use: No    Alcohol/week: 0.0 standard drinks   Drug use: No    Home Medications Prior to Admission medications   Medication Sig Start Date End Date Taking? Authorizing Provider  acetaminophen (TYLENOL) 500 MG tablet Take 1,000 mg by mouth every 6 (six) hours as needed for moderate pain.    [provider]  acetaminophen (TYLENOL) 650 MG CR tablet Take 1,300 mg by mouth 2 (two) times daily as needed for pain (takes twice daily).     [provider]  azithromycin (ZITHROMAX Z-PAK) 250 MG tablet Take 2 pills today, then 1 pill daily until gone. Patient not taking: Reported on 05/19/2018 12/23/15   Melony Overly, MD  chlorpheniramine-HYDROcodone [facility]) 10-8 MG/5ML SUER Take 5 mLs by mouth every 12 (twelve) hours as needed for cough. Patient not taking: Reported on 05/19/2018 12/23/15   Melony Overly, MD  docusate sodium (COLACE) 100 MG capsule Take 1 capsule (100 mg total) by mouth every 12 (twelve) hours. Patient not taking: Reported on 05/19/2018 08/28/16   Arlean Hopping C, PA-C  enalapril (VASOTEC) 10 MG tablet Take 1 tablet (10 mg total) by mouth daily. 10/19/15   Asencion Partridge, MD  gabapentin (NEURONTIN) 300 MG capsule Take 1 capsule (300 mg total) by mouth 3 (three) times daily. Patient taking differently: Take 300 mg by mouth 2 (two) times daily.  03/08/16   Gerda Diss, DO  Insulin Glargine (LANTUS SOLOSTAR) 100 UNIT/ML Solostar Pen Take 12 units in the morning and 12 units in the evening. Patient taking differently: Inject 18 Units into the skin 2 (two) times daily.  11/03/15   Lorella Nimrod, MD  oxybutynin (DITROPAN XL) 10 MG 24 hr tablet Take 1 tablet (10 mg total) by mouth at bedtime. Patient not taking: Reported on 05/19/2018 12/19/15   Rasch, Anderson Malta I, NP  oxybutynin (DITROPAN XL) 10 MG  24 hr tablet Take 1 tablet (10 mg total) by mouth at bedtime. Patient not taking: Reported on 05/19/2018 12/19/15   Rasch, Anderson Malta I, NP  predniSONE (DELTASONE) 50 MG tablet Take 1 pill daily for 5 days. Patient not taking: Reported on 05/19/2018 12/23/15   Melony Overly, MD  traMADol (ULTRAM) 50 MG tablet Take 1 tablet (50 mg total) by mouth every 6 (six) hours as needed. Patient not taking: Reported on 05/19/2018 08/28/16   Joy,  Shawn C, PA-C  trolamine salicylate (ASPERCREME) 10 % cream Apply 1 application topically 2 (two) times daily as needed for muscle pain.    [provider]  pantoprazole (PROTONIX) 20 MG tablet Take 2 tablets (40 mg total) by mouth daily. 01/29/11 04/28/11  Billy Fischer, MD  sertraline (ZOLOFT) 100 MG tablet Take 1 tablet (100 mg total) by mouth daily. 02/10/11 04/28/11  Jenelle Mages, MD    Allergies    Patient has no known allergies.  Review of Systems   Review of Systems  Gastrointestinal:  Negative for nausea and vomiting.  Genitourinary:  Positive for urgency. Negative for dysuria and hematuria.  Musculoskeletal:  Positive for back pain.  Neurological:  Negative for dizziness, tremors, weakness and numbness.   Physical Exam Updated Vital Signs BP (!) 159/86 (BP Location: Left Arm)   Pulse 92   Temp 99 F (37.2 C)   Resp 16   Ht 5\' 4"  (1.626 m)   Wt 93 kg   SpO2 94%   BMI 35.19 kg/m   Physical Exam Vitals and nursing note reviewed. Exam conducted with a chaperone present.  Constitutional:      Appearance: Normal appearance.  HENT:     Head: Normocephalic.  Eyes:     Extraocular Movements: Extraocular movements intact.     Pupils: Pupils are equal, round, and reactive to light.     Comments: No nystagmus   Neck:     Comments: No midline cervical tenderness. No palpable deformities.  Cardiovascular:     Rate and Rhythm: Normal rate and regular rhythm.     Pulses: Normal pulses.     Comments: DP, PT, and radial pulses 2+  and symmetrical bilaterally Pulmonary:     Effort: Pulmonary effort is normal.     Breath sounds: Normal breath sounds.  Abdominal:     Tenderness: There is no right CVA tenderness or left CVA tenderness.  Musculoskeletal:        General: Tenderness present.     Cervical back: Normal range of motion. No rigidity or tenderness.     Comments: Paraspinal tenderness, not well localized.  No midline tenderness, able to raise both legs without eliciting pain in the lower back.  Skin:    General: Skin is warm and dry.     Capillary Refill: Capillary refill takes less than 2 seconds.     Findings: No bruising or erythema.  Neurological:     Mental Status: She is alert and oriented to person, place, and time. Mental status is at baseline.     Comments: Patient is alert, oriented to personal, place and time with normal speech. Cranial nerves III-XII grossly in tact. Grip strength equal bilaterally LE strength equal bilaterally. Sensation to light touch in tact bilaterally. No gait abnormalities, patient ambulatory.    Psychiatric:        Mood and Affect: Mood normal.   ED Results / Procedures / Treatments   Labs (all labs ordered are listed, but only abnormal results are displayed) Labs Reviewed  URINE CULTURE  URINALYSIS, ROUTINE W REFLEX MICROSCOPIC    EKG None  Radiology No results found.  Procedures Procedures   Medications Ordered in ED Medications  lidocaine (LIDODERM) 5 % 1 patch (has no administration in time range)  acetaminophen (TYLENOL) tablet 1,000 mg (has no administration in time range)    ED Course  I have reviewed the triage vital signs and the nursing notes.  Pertinent labs & imaging results that  were available during my care of the patient were reviewed by me and considered in my medical decision making (see chart for details).    MDM Rules/Calculators/A&P                           Stable vitals, nontoxic-appearing.  Slightly elevated temperature but not  febrile.  Atraumatic back pain, no midline tenderness or focal deficits on neuro exam.  She was having some symptoms consistent with a UTI so urine ordered, UA negative for UTI.  Urine culture pending.  Given there is no midline tenderness or trauma to the back I do not think an x-ray would be beneficial.  The tenderness tends to be paraspinal, patient verbalized understanding.  Pain improved somewhat with Tylenol and Lidoderm patch.   Back pain most consistent with musculoskeletal spasm/strain.  Patient is neurologically intact, no focal deficits on exam. Patient is ambulatory.   No red flag symptoms - history of malignancy, prior spinal surgeries, IV drug use, severe trauma, neurologic deficits, fevers, prolonged corticosteroid use.  Doubt cauda equina - no bladder or bowel retention/incontinence, bilateral leg numbness or weakness, saddle anesthesia. Doubt osteomyelitis or epidural abscess - no history of IVDU, fever, vertebral tenderness Doubt pyelonephritis - no CVAT, urinary complaints Doubt dissection - equal peripheral pulses, no tachycardia, story not consistent.  Patient is discharged in stable condition awaiting transport home.  Do not think additional work-up is warranted at this time, she has a follow-up appoint with her primary care doctor scheduled for Tuesday of advised to keep.      Final Clinical Impression(s) / ED Diagnoses Final diagnoses:  None    Rx / DC Orders ED Discharge Orders     None        Sherrill Raring, [place] 01/14/21 1957    Tegeler, Gwenyth Allegra, MD 01/14/21 [phone]

## 2021-01-16 LAB — URINE CULTURE

## 2021-08-08 ENCOUNTER — Other Ambulatory Visit: Payer: Self-pay

## 2021-08-08 ENCOUNTER — Emergency Department (HOSPITAL_COMMUNITY): Payer: Medicaid Other

## 2021-08-08 ENCOUNTER — Emergency Department (HOSPITAL_COMMUNITY)
Admission: EM | Admit: 2021-08-08 | Discharge: 2021-08-08 | Disposition: A | Discharge status: Home / Self Care | Payer: Medicaid Other | Attending: Emergency Medicine | Admitting: Emergency Medicine

## 2021-08-08 ENCOUNTER — Encounter (HOSPITAL_COMMUNITY): Payer: Self-pay

## 2021-08-08 DIAGNOSIS — M25562 Pain in left knee: Secondary | ICD-10-CM

## 2021-08-08 DIAGNOSIS — M179 Osteoarthritis of knee, unspecified: Secondary | ICD-10-CM | POA: Insufficient documentation

## 2021-08-08 DIAGNOSIS — M170 Bilateral primary osteoarthritis of knee: Secondary | ICD-10-CM

## 2021-08-08 NOTE — Discharge Instructions (Signed)
Call your orthopedic doctor for follow-up appointment as soon as possible to get further care and treatment for your knee

## 2021-08-08 NOTE — ED Provider Notes (Signed)
[facility] DEPT Provider Note   CSN: [id number] Arrival date & time: 08/08/21  1443     History  Chief Complaint  Patient presents with   Knee Pain    [name] is a 66 y.o. female.  HPI  Patient presents for evaluation of bilateral knee pain which makes it difficult to walk.  She states her doctor told her 5 years ago that she had arthritis.  She has had no recent trauma.  She is having pain with walking.  She states that she cannot get around her house.  She cares for her daughter who is disabled.  She also helps to care for her elderly parent.  Home Medications Prior to Admission medications   Medication Sig Start Date End Date Taking? Authorizing Provider  acetaminophen (TYLENOL) 500 MG tablet Take 1,000 mg by mouth every 6 (six) hours as needed for moderate pain.    [provider]  acetaminophen (TYLENOL) 650 MG CR tablet Take 1,300 mg by mouth 2 (two) times daily as needed for pain (takes twice daily).     [provider]  azithromycin (ZITHROMAX Z-PAK) 250 MG tablet Take 2 pills today, then 1 pill daily until gone. Patient not taking: Reported on 05/19/2018 12/23/15   Melony Overly, MD  chlorpheniramine-HYDROcodone [facility]) 10-8 MG/5ML SUER Take 5 mLs by mouth every 12 (twelve) hours as needed for cough. Patient not taking: Reported on 05/19/2018 12/23/15   Melony Overly, MD  docusate sodium (COLACE) 100 MG capsule Take 1 capsule (100 mg total) by mouth every 12 (twelve) hours. Patient not taking: Reported on 05/19/2018 08/28/16   Arlean Hopping C, PA-C  enalapril (VASOTEC) 10 MG tablet Take 1 tablet (10 mg total) by mouth daily. 10/19/15   Asencion Partridge, MD  gabapentin (NEURONTIN) 300 MG capsule Take 1 capsule (300 mg total) by mouth 3 (three) times daily. Patient taking differently: Take 300 mg by mouth 2 (two) times daily. 03/08/16   Gerda Diss, DO  Insulin Glargine (LANTUS SOLOSTAR) 100 UNIT/ML Solostar  Pen Take 12 units in the morning and 12 units in the evening. Patient taking differently: Inject 18 Units into the skin 2 (two) times daily. 11/03/15   Lorella Nimrod, MD  lidocaine (LIDODERM) 5 % Place 1 patch onto the skin daily. Remove & Discard patch within 12 hours or as directed by MD 01/14/21   Sherrill Raring, PA-C  oxybutynin (DITROPAN XL) 10 MG 24 hr tablet Take 1 tablet (10 mg total) by mouth at bedtime. Patient not taking: Reported on 05/19/2018 12/19/15   Rasch, Anderson Malta I, NP  oxybutynin (DITROPAN XL) 10 MG 24 hr tablet Take 1 tablet (10 mg total) by mouth at bedtime. Patient not taking: Reported on 05/19/2018 12/19/15   Rasch, Anderson Malta I, NP  predniSONE (DELTASONE) 50 MG tablet Take 1 pill daily for 5 days. Patient not taking: Reported on 05/19/2018 12/23/15   Melony Overly, MD  traMADol (ULTRAM) 50 MG tablet Take 1 tablet (50 mg total) by mouth every 6 (six) hours as needed. Patient not taking: Reported on 05/19/2018 08/28/16   Arlean Hopping C, PA-C  trolamine salicylate (ASPERCREME) 10 % cream Apply 1 application topically 2 (two) times daily as needed for muscle pain.    [provider]  pantoprazole (PROTONIX) 20 MG tablet Take 2 tablets (40 mg total) by mouth daily. 01/29/11 04/28/11  Billy Fischer, MD  sertraline (ZOLOFT) 100 MG tablet Take 1 tablet (100 mg total)  by mouth daily. 02/10/11 04/28/11  Jenelle Mages, MD      Allergies    Patient has no known allergies.    Review of Systems   Review of Systems  Physical Exam Updated Vital Signs BP (!) 141/62   Pulse 87   Temp 98.2 F (36.8 C) (Oral)   Resp 16   Ht '5\' 4"'$  (1.626 m)   Wt 83.9 kg   SpO2 96%   BMI 31.76 kg/m  Physical Exam Vitals and nursing note reviewed.  Constitutional:      General: She is in acute distress (Uncomfortable and anxious).     Appearance: She is well-developed. She is not ill-appearing.  HENT:     Head: Normocephalic and atraumatic.     Right Ear: External ear normal.      Left Ear: External ear normal.  Eyes:     Conjunctiva/sclera: Conjunctivae normal.     Pupils: Pupils are equal, round, and reactive to light.  Neck:     Trachea: Phonation normal.  Cardiovascular:     Rate and Rhythm: Normal rate.  Pulmonary:     Effort: Pulmonary effort is normal.  Abdominal:     General: There is no distension.     Tenderness: There is no abdominal tenderness.  Musculoskeletal:        General: Normal range of motion.     Cervical back: Normal range of motion and neck supple.  Skin:    General: Skin is warm and dry.  Neurological:     Mental Status: She is alert and oriented to person, place, and time.     Cranial Nerves: No cranial nerve deficit.     Sensory: No sensory deficit.     Motor: No abnormal muscle tone.     Coordination: Coordination normal.  Psychiatric:        Attention and Perception: She is inattentive.        Mood and Affect: Mood is anxious.     ED Results / Procedures / Treatments   Labs (all labs ordered are listed, but only abnormal results are displayed) Labs Reviewed - No data to display   EKG None  Radiology DG Knee Complete 4 Views Right  Result Date: 08/08/2021 CLINICAL DATA:  Chronic bilateral knee pain, progressively worsening over the last few days. EXAM: RIGHT KNEE - COMPLETE 4+ VIEW COMPARISON:  None Available. FINDINGS: No fracture.  No bone lesion. Marked medial joint space compartment narrowing. Mild patellofemoral joint space compartment narrowing. Marginal osteophytes from all 3 compartments. Trace joint effusion. Surrounding soft tissues are unremarkable. IMPRESSION: 1. No fracture or acute finding. 2. Advanced osteoarthritis. Electronically Signed   By: Lajean Manes M.D.   On: 08/08/2021 15:57   DG Knee Complete 4 Views Left  Result Date: 08/08/2021 CLINICAL DATA:  Chronic bilateral knee pain progressively worsening over the last few days. EXAM: LEFT KNEE - COMPLETE 4+ VIEW COMPARISON:  12/28/2014. FINDINGS: No  fracture.  No bone lesion. Moderate to marked medial compartment narrowing with milder lateral compartment narrowing. Marginal osteophytes from all 3 compartments. Trace joint effusion. Surrounding soft tissues are unremarkable. IMPRESSION: 1. No fracture or acute finding. 2. Advanced osteoarthritis, similar to the prior radiographs. Electronically Signed   By: Lajean Manes M.D.   On: 08/08/2021 15:56    Procedures Procedures    Medications Ordered in ED Medications - No data to display  ED Course/ Medical Decision Making/ A&P Clinical Course as of 08/09/21 1636  Sun Aug 08, 2021  1845 Patient declined pain medicine.  Patient declined knee brace, stating "I already have one."  I encouraged her to follow-up with her orthopedist. [EW]    Clinical Course User Index [EW] Daleen Bo, MD                           Medical Decision Making Patient presenting with ongoing knee pain.  She does not have any acute traumatic injuries.  She is moderately anxious.  Problems Addressed: Osteoarthritis of both knees, unspecified osteoarthritis type: chronic illness or injury Pain in both knees, unspecified chronicity: chronic illness or injury  Amount and/or Complexity of Data Reviewed External Data Reviewed: radiology and notes.    Details: Prior detailed information regarding arthritis of both knees. Radiology: ordered and independent interpretation performed.    Details: Radiography both knees.  Advanced arthritis noted.  Risk Decision regarding hospitalization. Risk Details: Patient with chronic knee pain.  I have reviewed prior radiography which shows tricompartmental arthritis bilaterally.  Doubt fracture.  Doubt septic arthritis.  Further ED evaluation not necessary.  Hospitalization not necessary.  Symptomatic care indicated.           Final Clinical Impression(s) / ED Diagnoses Final diagnoses:  Pain in both knees, unspecified chronicity  Osteoarthritis of both knees,  unspecified osteoarthritis type    Rx / DC Orders ED Discharge Orders     None         Daleen Bo, MD 08/09/21 1636

## 2021-08-08 NOTE — ED Triage Notes (Signed)
Per EMS, patient from home, c/o hypertension today. Reports feeling overwhelmed and stress at home. EMS states patients BP decreased during transport. Also c/o chronic bilateral knee pain.   190/90 at home Most recent 158/70  96% RA CBG 139 HR 100

## 2021-08-08 NOTE — ED Provider Triage Note (Signed)
Emergency Medicine Provider Triage Evaluation Note  [name] , a 66 y.o. female  was evaluated in triage.  Pt complains of bilateral knee pain.  Patient reports that she has had chronic knee pain and has been told in the past that she needs to have bilateral knee replacements.  Patient reports that over the last few days knee pain has gotten progressively worse.  Patient reports that pain is now causing difficulties with her mobility.  Denies any recent falls or injuries.  Review of Systems  Positive: Bilateral knee pain Negative: Fever, chills, neck pain, back pain, numbness, weakness  Physical Exam  BP 134/76 (BP Location: Right Arm)   Pulse 90   Temp 99.4 F (37.4 C) (Oral)   Resp 16   SpO2 96%  Gen:   Awake, no distress   Resp:  Normal effort  MSK:   Moves extremities without difficulty; mild swelling noted to bilateral knees.  No erythema or warmth noted to bilateral knees.  Patient has full range of motion to bilateral knees. Other:    Medical Decision Making  Medically screening exam initiated at 3:18 PM.  Appropriate orders placed.  [name] was informed that the remainder of the evaluation will be completed by another provider, this initial triage assessment does not replace that evaluation, and the importance of remaining in the ED until their evaluation is complete.  X-ray imaging ordered.   Loni Beckwith, [place] 08/08/21 1519

## 2021-08-08 NOTE — ED Notes (Signed)
Pt d/c home per MD order. Pt irritable , Discharge summary reviewed, off unit via [facility]- Reports discharge ride home

## 2021-12-20 ENCOUNTER — Other Ambulatory Visit: Payer: Self-pay

## 2021-12-20 ENCOUNTER — Emergency Department (HOSPITAL_COMMUNITY): Payer: Medicaid Other

## 2021-12-20 ENCOUNTER — Other Ambulatory Visit (HOSPITAL_COMMUNITY): Payer: Self-pay

## 2021-12-20 ENCOUNTER — Emergency Department (HOSPITAL_COMMUNITY)
Admission: EM | Admit: 2021-12-20 | Discharge: 2021-12-20 | Disposition: A | Discharge status: Home / Self Care | Payer: Medicaid Other | Attending: Emergency Medicine | Admitting: Emergency Medicine

## 2021-12-20 ENCOUNTER — Encounter (HOSPITAL_COMMUNITY): Payer: Self-pay | Admitting: Emergency Medicine

## 2021-12-20 DIAGNOSIS — Z20822 Contact with and (suspected) exposure to covid-19: Secondary | ICD-10-CM | POA: Insufficient documentation

## 2021-12-20 DIAGNOSIS — Z794 Long term (current) use of insulin: Secondary | ICD-10-CM | POA: Insufficient documentation

## 2021-12-20 DIAGNOSIS — Z7984 Long term (current) use of oral hypoglycemic drugs: Secondary | ICD-10-CM | POA: Diagnosis not present

## 2021-12-20 DIAGNOSIS — N189 Chronic kidney disease, unspecified: Secondary | ICD-10-CM | POA: Diagnosis not present

## 2021-12-20 DIAGNOSIS — R509 Fever, unspecified: Secondary | ICD-10-CM | POA: Diagnosis present

## 2021-12-20 DIAGNOSIS — J101 Influenza due to other identified influenza virus with other respiratory manifestations: Secondary | ICD-10-CM | POA: Diagnosis not present

## 2021-12-20 DIAGNOSIS — I129 Hypertensive chronic kidney disease with stage 1 through stage 4 chronic kidney disease, or unspecified chronic kidney disease: Secondary | ICD-10-CM | POA: Insufficient documentation

## 2021-12-20 DIAGNOSIS — E1122 Type 2 diabetes mellitus with diabetic chronic kidney disease: Secondary | ICD-10-CM | POA: Insufficient documentation

## 2021-12-20 LAB — CBC WITH DIFFERENTIAL/PLATELET
Abs Immature Granulocytes: 0.03 10*3/uL (ref 0.00–0.07)
Basophils Absolute: 0 10*3/uL (ref 0.0–0.1)
Basophils Relative: 0 %
Eosinophils Absolute: 0 10*3/uL (ref 0.0–0.5)
Eosinophils Relative: 0 %
HCT: 41.9 % (ref 36.0–46.0)
Hemoglobin: 13.5 g/dL (ref 12.0–15.0)
Immature Granulocytes: 0 %
Lymphocytes Relative: 23 %
Lymphs Abs: 1.8 10*3/uL (ref 0.7–4.0)
MCH: 32.2 pg (ref 26.0–34.0)
MCHC: 32.2 g/dL (ref 30.0–36.0)
MCV: 100 fL (ref 80.0–100.0)
Monocytes Absolute: 0.7 10*3/uL (ref 0.1–1.0)
Monocytes Relative: 9 %
Neutro Abs: 5.3 10*3/uL (ref 1.7–7.7)
Neutrophils Relative %: 68 %
Platelets: 165 10*3/uL (ref 150–400)
RBC: 4.19 MIL/uL (ref 3.87–5.11)
RDW: 12.8 % (ref 11.5–15.5)
WBC: 7.9 10*3/uL (ref 4.0–10.5)
nRBC: 0 % (ref 0.0–0.2)

## 2021-12-20 LAB — RESP PANEL BY RT-PCR (FLU A&B, COVID) ARPGX2
Influenza A by PCR: POSITIVE — AB
Influenza B by PCR: NEGATIVE
SARS Coronavirus 2 by RT PCR: NEGATIVE

## 2021-12-20 LAB — BASIC METABOLIC PANEL
Anion gap: 13 (ref 5–15)
BUN: 30 mg/dL — ABNORMAL HIGH (ref 8–23)
CO2: 30 mmol/L (ref 22–32)
Calcium: 9.2 mg/dL (ref 8.9–10.3)
Chloride: 97 mmol/L — ABNORMAL LOW (ref 98–111)
Creatinine, Ser: 1.64 mg/dL — ABNORMAL HIGH (ref 0.44–1.00)
GFR, Estimated: 35 mL/min — ABNORMAL LOW (ref >60)
Glucose, Bld: 129 mg/dL — ABNORMAL HIGH (ref 70–99)
Potassium: 3.6 mmol/L (ref 3.5–5.1)
Sodium: 140 mmol/L (ref 135–145)

## 2021-12-20 MED ORDER — ONDANSETRON 4 MG PO TBDP
4.0000 mg | ORAL_TABLET | Freq: Three times a day (TID) | ORAL | 0 refills | Status: AC | PRN
  Filled 2021-12-20: qty 12, 4d supply, fill #0

## 2021-12-20 MED ORDER — XOFLUZA (80 MG DOSE) 1 X 80 MG PO TBPK
80.0000 mg | ORAL_TABLET | Freq: Every day | ORAL | 0 refills | Status: DC
  Filled 2021-12-20: qty 1, 1d supply, fill #0

## 2021-12-20 NOTE — ED Notes (Signed)
Pt. Ask to be taken up stairs to see her daughter but has the flu. Pt wheeled to lobby to talk to security.

## 2021-12-20 NOTE — Discharge Instructions (Signed)
Take Xofluza as soon as possible, needs to be within the first 48 hours of symptoms. Zofran as needed as prescribed for nausea and vomiting.  Return to ER as needed for worsening or concerning symptoms. Recheck with your doctor.

## 2021-12-20 NOTE — ED Provider Notes (Signed)
[facility] EMERGENCY DEPARTMENT Provider Note   CSN: [id number] Arrival date & time: 12/20/21  0410     History  Chief Complaint  Patient presents with   Fever / Chills / Bodyaches    [name] is a 66 y.o. female.  66 year old female presents to the emergency room with complaint of cough, body aches and chills.  Patient states her symptoms started last night, states her daughter is currently admitted to the hospital with the flu.  Reports that she is feeling nauseous without vomiting or diarrhea.  Past medical history includes diabetes, hypertension, hyperlipidemia, anxiety, depression, IBS, CKD.  Patient would like to be discharged as soon as possible so that she can get back to her child in the hospital.       Home Medications Prior to Admission medications   Medication Sig Start Date End Date Taking? Authorizing Provider  ondansetron (ZOFRAN-ODT) 4 MG disintegrating tablet Take 1 tablet (4 mg total) by mouth every 8 (eight) hours as needed for nausea or vomiting. 12/20/21  Yes Tacy Learn, PA-C  acetaminophen (TYLENOL) 500 MG tablet Take 1,000 mg by mouth every 6 (six) hours as needed for moderate pain.    [provider]  acetaminophen (TYLENOL) 650 MG CR tablet Take 1,300 mg by mouth 2 (two) times daily as needed for pain (takes twice daily).     [provider]  azithromycin (ZITHROMAX Z-PAK) 250 MG tablet Take 2 pills today, then 1 pill daily until gone. Patient not taking: Reported on 05/19/2018 12/23/15   Melony Overly, MD  chlorpheniramine-HYDROcodone [facility]) 10-8 MG/5ML SUER Take 5 mLs by mouth every 12 (twelve) hours as needed for cough. Patient not taking: Reported on 05/19/2018 12/23/15   Melony Overly, MD  docusate sodium (COLACE) 100 MG capsule Take 1 capsule (100 mg total) by mouth every 12 (twelve) hours. Patient not taking: Reported on 05/19/2018 08/28/16   Arlean Hopping C, PA-C  enalapril (VASOTEC) 10 MG  tablet Take 1 tablet (10 mg total) by mouth daily. 10/19/15   Asencion Partridge, MD  gabapentin (NEURONTIN) 300 MG capsule Take 1 capsule (300 mg total) by mouth 3 (three) times daily. Patient taking differently: Take 300 mg by mouth 2 (two) times daily. 03/08/16   Gerda Diss, DO  Insulin Glargine (LANTUS SOLOSTAR) 100 UNIT/ML Solostar Pen Take 12 units in the morning and 12 units in the evening. Patient taking differently: Inject 18 Units into the skin 2 (two) times daily. 11/03/15   Lorella Nimrod, MD  lidocaine (LIDODERM) 5 % Place 1 patch onto the skin daily. Remove & Discard patch within 12 hours or as directed by MD 01/14/21   Sherrill Raring, PA-C  oxybutynin (DITROPAN XL) 10 MG 24 hr tablet Take 1 tablet (10 mg total) by mouth at bedtime. Patient not taking: Reported on 05/19/2018 12/19/15   Rasch, Anderson Malta I, NP  oxybutynin (DITROPAN XL) 10 MG 24 hr tablet Take 1 tablet (10 mg total) by mouth at bedtime. Patient not taking: Reported on 05/19/2018 12/19/15   Rasch, Anderson Malta I, NP  predniSONE (DELTASONE) 50 MG tablet Take 1 pill daily for 5 days. Patient not taking: Reported on 05/19/2018 12/23/15   Melony Overly, MD  traMADol (ULTRAM) 50 MG tablet Take 1 tablet (50 mg total) by mouth every 6 (six) hours as needed. Patient not taking: Reported on 05/19/2018 08/28/16   Arlean Hopping C, PA-C  trolamine salicylate (ASPERCREME) 10 % cream Apply 1 application topically  2 (two) times daily as needed for muscle pain.    [provider]  pantoprazole (PROTONIX) 20 MG tablet Take 2 tablets (40 mg total) by mouth daily. 01/29/11 04/28/11  Billy Fischer, MD  sertraline (ZOLOFT) 100 MG tablet Take 1 tablet (100 mg total) by mouth daily. 02/10/11 04/28/11  Jenelle Mages, MD      Allergies    Patient has no known allergies.    Review of Systems   Review of Systems Negative except as per HPI Physical Exam Updated Vital Signs BP (!) 149/81 (BP Location: Left Arm)   Pulse (!) 113   Temp 99.6  F (37.6 C) (Oral)   Resp (!) 22   SpO2 93%  Physical Exam Vitals and nursing note reviewed.  Constitutional:      General: She is not in acute distress.    Appearance: She is well-developed. She is not diaphoretic.  HENT:     Head: Normocephalic and atraumatic.  Eyes:     Conjunctiva/sclera: Conjunctivae normal.  Cardiovascular:     Rate and Rhythm: Normal rate and regular rhythm.     Heart sounds: Normal heart sounds.  Pulmonary:     Effort: Pulmonary effort is normal.     Breath sounds: Normal breath sounds.  Abdominal:     Palpations: Abdomen is soft.     Tenderness: There is no abdominal tenderness.  Musculoskeletal:     Cervical back: Neck supple.     Right lower leg: No edema.     Left lower leg: No edema.  Skin:    General: Skin is warm and dry.     Findings: No erythema or rash.  Neurological:     Mental Status: She is alert and oriented to person, place, and time.  Psychiatric:        Behavior: Behavior normal.     ED Results / Procedures / Treatments   Labs (all labs ordered are listed, but only abnormal results are displayed) Labs Reviewed  RESP PANEL BY RT-PCR (FLU A&B, COVID) ARPGX2 - Abnormal; Notable for the following components:      Result Value   Influenza A by PCR POSITIVE (*)    All other components within normal limits  BASIC METABOLIC PANEL - Abnormal; Notable for the following components:   Chloride 97 (*)    Glucose, Bld 129 (*)    BUN 30 (*)    Creatinine, Ser 1.64 (*)    GFR, Estimated 35 (*)    All other components within normal limits  CBC WITH DIFFERENTIAL/PLATELET    EKG None  Radiology DG Chest 2 View  Result Date: 12/20/2021 CLINICAL DATA:  Cough, fever, chills, and body aches. EXAM: CHEST - 2 VIEW COMPARISON:  11/27/2015. FINDINGS: The heart is enlarged and the mediastinal contour is within normal limits. Examination is limited due to patient rotation. There is elevation of the left diaphragm with minimal atelectasis at  the left lung base. No effusion or pneumothorax is seen. No acute osseous abnormality. IMPRESSION: 1. Cardiomegaly. 2. Mild atelectasis at the left lung base. Electronically Signed   By: Brett Fairy M.D.   On: 12/20/2021 04:45    Procedures Procedures    Medications Ordered in ED Medications - No data to display  ED Course/ Medical Decision Making/ A&P                           Medical Decision Making Risk Prescription drug management.  66 year old female with bodyaches, cough, congestion and chills as above.  On exam, she appears to feel unwell although nontoxic, in no distress.  Her lungs are clear to auscultation.  She appears appropriately hydrated.  Review of lab work, her CBC is within normal limits.  BMP without significant change from prior on file including creatinine today at 1.64.  Patient is negative for COVID, is positive for influenza A.  Chest x-ray shows cardiomegaly with mild atelectasis in left lung base.  Agree with radiologist interpretation.  Plan was to treat with [place] however patient's insurance does not cover this medication and she is unable to use the medication voucher.  Do not feel Tamiflu is beneficial given risks and side effects associated with Tamiflu.  Patient is provided with Zofran to help with her nausea.  She is advised not to visit her family member while she is ill.  Recommend recheck with her primary care provider with return to ER precautions provided.        Final Clinical Impression(s) / ED Diagnoses Final diagnoses:  Influenza A    Rx / DC Orders ED Discharge Orders          Ordered    Baloxavir Marboxil,80 MG Dose, (XOFLUZA, 80 MG DOSE,) 1 x 80 MG TBPK  Daily,   Status:  Discontinued        12/20/21 0757    ondansetron (ZOFRAN-ODT) 4 MG disintegrating tablet  Every 8 hours PRN        12/20/21 0757              Tacy Learn, PA-C 12/20/21 1004    Audley Hose, MD 12/20/21 1213

## 2021-12-20 NOTE — ED Provider Triage Note (Signed)
Emergency Medicine Provider Triage Evaluation Note  [name] , a 66 y.o. female  was evaluated in triage.  Pt complains of flu like symptoms since last night-- cough, congestion, body aches, feeling shaky.  Denies vomiting.  Daughter sick with similar but not being seen.    Review of Systems  Positive: Flu like symptoms Negative: Vomiting, diarrhea  Physical Exam  BP (!) 146/75   Pulse (!) 111   Temp 99.7 F (37.6 C) (Oral)   Resp 20   SpO2 96%   Gen:   Awake, no distress, appears unwell Resp:  Normal effort  MSK:   Moves extremities without difficulty  Other:    Medical Decision Making  Medically screening exam initiated at 4:26 AM.  Appropriate orders placed.  [name] was informed that the remainder of the evaluation will be completed by another provider, this initial triage assessment does not replace that evaluation, and the importance of remaining in the ED until their evaluation is complete.  Flu like symptoms, daughter sick with similar.  Appears unwell but non-toxic.  Will check labs, covid/flu screen, CXR.   Larene Pickett, PA-C 12/20/21 [phone]

## 2021-12-20 NOTE — ED Triage Notes (Signed)
Patient reports fever with chills , body aches , fatigue and occasional dry cough onset last night .

## 2022-01-29 ENCOUNTER — Other Ambulatory Visit: Payer: Self-pay

## 2022-01-29 ENCOUNTER — Emergency Department (HOSPITAL_COMMUNITY)
Admission: EM | Admit: 2022-01-29 | Discharge: 2022-01-30 | Disposition: A | Discharge status: Home / Self Care | Payer: Medicaid Other | Attending: Emergency Medicine | Admitting: Emergency Medicine

## 2022-01-29 ENCOUNTER — Emergency Department (HOSPITAL_COMMUNITY): Payer: Medicaid Other

## 2022-01-29 ENCOUNTER — Encounter (HOSPITAL_COMMUNITY): Payer: Self-pay

## 2022-01-29 DIAGNOSIS — I129 Hypertensive chronic kidney disease with stage 1 through stage 4 chronic kidney disease, or unspecified chronic kidney disease: Secondary | ICD-10-CM | POA: Insufficient documentation

## 2022-01-29 DIAGNOSIS — N183 Chronic kidney disease, stage 3 unspecified: Secondary | ICD-10-CM | POA: Insufficient documentation

## 2022-01-29 DIAGNOSIS — E1122 Type 2 diabetes mellitus with diabetic chronic kidney disease: Secondary | ICD-10-CM | POA: Insufficient documentation

## 2022-01-29 DIAGNOSIS — S7002XA Contusion of left hip, initial encounter: Secondary | ICD-10-CM | POA: Insufficient documentation

## 2022-01-29 DIAGNOSIS — Z794 Long term (current) use of insulin: Secondary | ICD-10-CM | POA: Insufficient documentation

## 2022-01-29 DIAGNOSIS — Z7984 Long term (current) use of oral hypoglycemic drugs: Secondary | ICD-10-CM | POA: Diagnosis not present

## 2022-01-29 DIAGNOSIS — W19XXXA Unspecified fall, initial encounter: Secondary | ICD-10-CM | POA: Diagnosis not present

## 2022-01-29 DIAGNOSIS — M25552 Pain in left hip: Secondary | ICD-10-CM | POA: Diagnosis present

## 2022-01-29 DIAGNOSIS — Z79899 Other long term (current) drug therapy: Secondary | ICD-10-CM | POA: Insufficient documentation

## 2022-01-29 NOTE — ED Provider Notes (Signed)
[facility] DEPT Provider Note   CSN: [id number] Arrival date & time: 01/29/22  1616     History {Add pertinent medical, surgical, social history, OB history to HPI:1} Chief Complaint  Patient presents with   Hip Pain         [name] is a 66 y.o. female.  The history is provided by the patient.  Hip Pain  [name] is a 66 y.o. female who presents to the Emergency Department complaining of hip pain.  She presents to the emergency department for evaluation of left hip pain.  She states that on Monday she had a fall when she was attempting to plug in a computer cord.  She was evaluated at another hospital and had x-rays of her back but was concerned there might be something wrong with her hip because she has pain in her left lower back that radiates to her left hip.  She is able to walk.  She has stage III CKD, diabetes, hypertension.  Due to her kidney disease that she has been only able to take Tylenol as well as tramadol for her pain.     Home Medications Prior to Admission medications   Medication Sig Start Date End Date Taking? Authorizing Provider  acetaminophen (TYLENOL) 500 MG tablet Take 1,000 mg by mouth every 6 (six) hours as needed for moderate pain.   Yes [provider]  acetaminophen (TYLENOL) 650 MG CR tablet Take 1,300 mg by mouth 2 (two) times daily as needed for pain (takes twice daily).    Yes [provider]  amLODipine (NORVASC) 5 MG tablet Take 5 mg by mouth daily.   Yes [provider]  furosemide (LASIX) 20 MG tablet Take 1 tablet by mouth daily. 12/27/21  Yes [provider]  gabapentin (NEURONTIN) 600 MG tablet Take 600 mg by mouth 2 (two) times daily as needed (pain).   Yes [provider]  Insulin Glargine (LANTUS SOLOSTAR) 100 UNIT/ML Solostar Pen Take 12 units in the morning and 12 units in the evening. Patient taking differently: Inject 17 Units into the skin 2 (two)  times daily. 11/03/15  Yes Lorella Nimrod, MD  pioglitazone (ACTOS) 45 MG tablet Take 1 tablet by mouth daily. 06/01/18  Yes [provider]  rosuvastatin (CRESTOR) 10 MG tablet Take 1 tablet by mouth daily. 06/01/18  Yes [provider]  traMADol (ULTRAM) 50 MG tablet Take 1 tablet (50 mg total) by mouth every 6 (six) hours as needed. Patient taking differently: Take 50 mg by mouth every 6 (six) hours as needed for moderate pain or severe pain. 08/28/16  Yes Joy, Shawn C, PA-C  azithromycin (ZITHROMAX Z-PAK) 250 MG tablet Take 2 pills today, then 1 pill daily until gone. Patient not taking: Reported on 05/19/2018 12/23/15   Melony Overly, MD  chlorpheniramine-HYDROcodone [facility] ER) 10-8 MG/5ML SUER Take 5 mLs by mouth every 12 (twelve) hours as needed for cough. Patient not taking: Reported on 05/19/2018 12/23/15   Melony Overly, MD  docusate sodium (COLACE) 100 MG capsule Take 1 capsule (100 mg total) by mouth every 12 (twelve) hours. Patient not taking: Reported on 05/19/2018 08/28/16   Arlean Hopping C, PA-C  enalapril (VASOTEC) 10 MG tablet Take 1 tablet (10 mg total) by mouth daily. Patient not taking: Reported on 01/29/2022 10/19/15   Asencion Partridge, MD  gabapentin (NEURONTIN) 300 MG capsule Take 1 capsule (300 mg total) by mouth 3 (three) times daily. Patient not taking:  Reported on 01/29/2022 03/08/16   Gerda Diss, DO  lidocaine (LIDODERM) 5 % Place 1 patch onto the skin daily. Remove & Discard patch within 12 hours or as directed by MD Patient not taking: Reported on 01/29/2022 01/14/21   Sherrill Raring, PA-C  ondansetron (ZOFRAN-ODT) 4 MG disintegrating tablet Take 1 tablet (4 mg total) by mouth every 8 (eight) hours as needed for nausea or vomiting. Patient not taking: Reported on 01/29/2022 12/20/21   Suella Broad A, PA-C  oxybutynin (DITROPAN XL) 10 MG 24 hr tablet Take 1 tablet (10 mg total) by mouth at bedtime. Patient not taking: Reported on 05/19/2018  12/19/15   Rasch, Anderson Malta I, NP  oxybutynin (DITROPAN XL) 10 MG 24 hr tablet Take 1 tablet (10 mg total) by mouth at bedtime. Patient not taking: Reported on 05/19/2018 12/19/15   Rasch, Anderson Malta I, NP  predniSONE (DELTASONE) 50 MG tablet Take 1 pill daily for 5 days. Patient not taking: Reported on 05/19/2018 12/23/15   Melony Overly, MD  pantoprazole (PROTONIX) 20 MG tablet Take 2 tablets (40 mg total) by mouth daily. 01/29/11 04/28/11  Billy Fischer, MD  sertraline (ZOLOFT) 100 MG tablet Take 1 tablet (100 mg total) by mouth daily. 02/10/11 04/28/11  Jenelle Mages, MD      Allergies    Patient has no known allergies.    Review of Systems   Review of Systems  All other systems reviewed and are negative.   Physical Exam Updated Vital Signs BP 128/76   Pulse 95   Temp 98.6 F (37 C) (Oral)   Resp 18   Ht '5\' 4"'$  (1.626 m)   Wt 86.2 kg   SpO2 99%   BMI 32.61 kg/m  Physical Exam Vitals and nursing note reviewed.  Constitutional:      Appearance: She is well-developed.  HENT:     Head: Normocephalic and atraumatic.  Cardiovascular:     Rate and Rhythm: Normal rate and regular rhythm.     Heart sounds: No murmur heard. Pulmonary:     Effort: Pulmonary effort is normal. No respiratory distress.     Breath sounds: Normal breath sounds.  Abdominal:     Palpations: Abdomen is soft.     Tenderness: There is no abdominal tenderness. There is no guarding or rebound.  Musculoskeletal:     Comments: No appreciable point bony tenderness over the lumbar spine, SI joint or hip.    Skin:    General: Skin is warm and dry.  Neurological:     Mental Status: She is alert and oriented to person, place, and time.     Comments: 5 out of 5 strength in bilateral lower extremities.  Psychiatric:     Comments: Anxious     ED Results / Procedures / Treatments   Labs (all labs ordered are listed, but only abnormal results are displayed) Labs Reviewed - No data to  display  EKG None  Radiology DG Hip Unilat W or Wo Pelvis 2-3 Views Left  Result Date: 01/29/2022 CLINICAL DATA:  Fall 5 days ago.  Left posterior hip pain. EXAM: DG HIP (WITH OR WITHOUT PELVIS) 2-3V LEFT COMPARISON:  None Available. FINDINGS: No fracture.  No bone lesion. Mild medial right hip joint space narrowing. Left hip joint normally spaced and aligned. SI joints and symphysis pubis normally spaced and aligned. Soft tissues are unremarkable. IMPRESSION: No fracture or acute finding. Electronically Signed   By: Lajean Manes M.D.   On: 01/29/2022  16:53    Procedures Procedures  {Document cardiac monitor, telemetry assessment procedure when appropriate:1}  Medications Ordered in ED Medications - No data to display  ED Course/ Medical Decision Making/ A&P                           Medical Decision Making  ***  {Document critical care time when appropriate:1} {Document review of labs and clinical decision tools ie heart score, Chads2Vasc2 etc:1}  {Document your independent review of radiology images, and any outside records:1} {Document your discussion with family members, caretakers, and with consultants:1} {Document social determinants of health affecting pt's care:1} {Document your decision making why or why not admission, treatments were needed:1} Final Clinical Impression(s) / ED Diagnoses Final diagnoses:  None    Rx / DC Orders ED Discharge Orders     None

## 2022-01-29 NOTE — ED Notes (Signed)
Pt. States that she needs something more than tramadol. States it is not working at home.

## 2022-01-29 NOTE — ED Triage Notes (Signed)
Patient said she fell onto left hip on 12/18. No LOC. No blood thinners.

## 2022-01-29 NOTE — ED Provider Triage Note (Signed)
Emergency Medicine Provider Triage Evaluation Note  [name] , a 66 y.o. female  was evaluated in triage.  Patient complains of a fall 2 days ago.  She was seen at [facility] yesterday with normal x-rays of her back.  Says most of her pain is in her left hip and she is unable to bear weight  Review of Systems  Positive:  Negative: Syncope  Physical Exam  There were no vitals taken for this visit. Gen:   Awake, no distress   Resp:  Normal effort  MSK:   Moves extremities without difficulty  Other:  Tenderness to left hip  Medical Decision Making  Medically screening exam initiated at 4:32 PM.  Appropriate orders placed.  Rooney Gladwin was informed that the remainder of the evaluation will be completed by another provider, this initial triage assessment does not replace that evaluation, and the importance of remaining in the ED until their evaluation is complete.     Rhae Hammock, [place] 01/29/22 1633

## 2022-01-30 MED ORDER — ALUM & MAG HYDROXIDE-SIMETH 200-200-20 MG/5ML PO SUSP
15.0000 mL | Freq: Once | ORAL | Status: AC
  Administered 2022-01-30: 15 mL via ORAL
  Filled 2022-01-30: qty 30

## 2022-01-30 MED ORDER — MAGNESIUM HYDROXIDE 400 MG/5ML PO SUSP: 30.0000 mL | Freq: Once | ORAL | Status: DC

## 2022-01-30 MED ORDER — TRAMADOL HCL 50 MG PO TABS
50.0000 mg | ORAL_TABLET | Freq: Once | ORAL | Status: AC
  Administered 2022-01-30: 50 mg via ORAL
  Filled 2022-01-30: qty 1

## 2022-01-30 NOTE — Discharge Instructions (Addendum)
Continue your current medications as prescribed.   You may take over-the-counter diclofenac gel or Lidoderm cream according to label instructions.  Rest when you develop pain and you can apply warm or cold compresses for pain.  Please follow-up with your family doctor for repeat evaluation in the next week if you have ongoing symptoms.

## 2022-02-04 ENCOUNTER — Other Ambulatory Visit: Payer: Self-pay

## 2022-02-04 ENCOUNTER — Emergency Department
Admission: EM | Admit: 2022-02-04 | Discharge: 2022-02-04 | Disposition: A | Discharge status: Home / Self Care | Payer: Medicaid Other | Attending: Emergency Medicine | Admitting: Emergency Medicine

## 2022-02-04 ENCOUNTER — Encounter: Payer: Self-pay | Admitting: Emergency Medicine

## 2022-02-04 DIAGNOSIS — I10 Essential (primary) hypertension: Secondary | ICD-10-CM | POA: Insufficient documentation

## 2022-02-04 DIAGNOSIS — R739 Hyperglycemia, unspecified: Secondary | ICD-10-CM

## 2022-02-04 DIAGNOSIS — E1165 Type 2 diabetes mellitus with hyperglycemia: Secondary | ICD-10-CM | POA: Insufficient documentation

## 2022-02-04 LAB — CBC
HCT: 41.7 % (ref 36.0–46.0)
Hemoglobin: 13.7 g/dL (ref 12.0–15.0)
MCH: 31.6 pg (ref 26.0–34.0)
MCHC: 32.9 g/dL (ref 30.0–36.0)
MCV: 96.1 fL (ref 80.0–100.0)
Platelets: 217 10*3/uL (ref 150–400)
RBC: 4.34 MIL/uL (ref 3.87–5.11)
RDW: 13.3 % (ref 11.5–15.5)
WBC: 9.1 10*3/uL (ref 4.0–10.5)
nRBC: 0 % (ref 0.0–0.2)

## 2022-02-04 LAB — BASIC METABOLIC PANEL
Anion gap: 11 (ref 5–15)
BUN: 29 mg/dL — ABNORMAL HIGH (ref 8–23)
CO2: 29 mmol/L (ref 22–32)
Calcium: 8.9 mg/dL (ref 8.9–10.3)
Chloride: 95 mmol/L — ABNORMAL LOW (ref 98–111)
Creatinine, Ser: 1.48 mg/dL — ABNORMAL HIGH (ref 0.44–1.00)
GFR, Estimated: 39 mL/min — ABNORMAL LOW (ref >60)
Glucose, Bld: 412 mg/dL — ABNORMAL HIGH (ref 70–99)
Potassium: 3.5 mmol/L (ref 3.5–5.1)
Sodium: 135 mmol/L (ref 135–145)

## 2022-02-04 LAB — CBG MONITORING, ED
Glucose-Capillary: 408 mg/dL — ABNORMAL HIGH (ref 70–99)
Glucose-Capillary: 377 mg/dL — ABNORMAL HIGH (ref 70–99)

## 2022-02-04 MED ORDER — SODIUM CHLORIDE 0.9 % IV BOLUS
1000.0000 mL | Freq: Once | INTRAVENOUS | Status: AC
  Administered 2022-02-04: 1000 mL via INTRAVENOUS

## 2022-02-04 NOTE — ED Provider Triage Note (Signed)
  Emergency Medicine Provider Triage Evaluation Note  [name] , a 66 y.o.female,  was evaluated in triage.  Pt complains of hyperglycemia.  Patient dates that she was on her way to see her daughter who is currently being admitted here she is extremely anxious about it.  She pulled over to the side of the road because she felt she was unable to drive.  She presents via EMS.  She was found to have blood sugar in the 500s.  Blood sugars currently following it.  Currently not endorsing any pain at this time.   Review of Systems  Positive: Hyperglycemia, anxiety. Negative: Denies fever, chest pain, vomiting  Physical Exam   Vitals:   02/04/22 1748  BP: (!) 158/87  Pulse: (!) 120  Resp: 20  Temp: 98.9 F (37.2 C)  SpO2: 97%   Gen:   Awake, appears anxious. Resp:  Normal effort  MSK:   Moves extremities without difficulty  Other:    Medical Decision Making  Given the patient's initial medical screening exam, the following diagnostic evaluation has been ordered. The patient will be placed in the appropriate treatment space, once one is available, to complete the evaluation and treatment. I have discussed the plan of care with the patient and I have advised the patient that an ED physician or mid-level practitioner will reevaluate their condition after the test results have been received, as the results may give them additional insight into the type of treatment they may need.    Diagnostics: Labs, UA  Treatments: IV fluids.   Teodoro Spray, [place] 02/04/22 1754

## 2022-02-04 NOTE — ED Notes (Signed)
First Nurse Note: Pt to ED via [facility] from road side for hyperglycemia. Pt has 20 G IV in RAC. CBG 575.  BP 157/115, hx/o same, did not take her med today.

## 2022-02-04 NOTE — ED Triage Notes (Signed)
Patient coming by EMS- pt pulled over to side of road and highway patrol called EMS for patient. Patients daughter is here being admitted and patient is extremely anxious and worried for her daughter. CBG 408 in triage.

## 2022-02-04 NOTE — Discharge Instructions (Signed)
Take your medications as prescribed Return if worsening

## 2022-02-04 NOTE — ED Provider Notes (Signed)
[facility] Provider Note    Event Date/Time   First MD Initiated Contact with Patient 02/04/22 2004     (approximate)   History   Hyperglycemia   HPI  [name] is a 66 y.o. female with history of diabetes, hyperlipidemia, hypertension and depression presents to the ER after being picked up by EMS on the side of the road, patient was driving here when she had an anxiety attack about her daughter being admitted and had to have EMS bring her to the ER .  Patient is feeling better after arriving to the ER but is also worried about her glucose being elevated bc she did not take her medication today      Physical Exam   Triage Vital Signs: ED Triage Vitals [02/04/22 1748]  Enc Vitals Group     BP (!) 158/87     Pulse Rate (!) 120     Resp 20     Temp 98.9 F (37.2 C)     Temp Source Oral     SpO2 97 %     Weight 190 lb (86.2 kg)     Height '5\' 4"'$  (1.626 m)     Head Circumference      Peak Flow      Pain Score 0     Pain Loc      Pain Edu?      Excl. in [place]?     Most recent vital signs: Vitals:   02/04/22 1748  BP: (!) 158/87  Pulse: (!) 120  Resp: 20  Temp: 98.9 F (37.2 C)  SpO2: 97%     General: Awake, no distress.   CV:  Good peripheral perfusion. regular rate and  rhythm Resp:  Normal effort. Lungs cta Abd:  No distention.   Other:      ED Results / Procedures / Treatments   Labs (all labs ordered are listed, but only abnormal results are displayed) Labs Reviewed  BASIC METABOLIC PANEL - Abnormal; Notable for the following components:      Result Value   Chloride 95 (*)    Glucose, Bld 412 (*)    BUN 29 (*)    Creatinine, Ser 1.48 (*)    GFR, Estimated 39 (*)    All other components within normal limits  CBG MONITORING, ED - Abnormal; Notable for the following components:   Glucose-Capillary 408 (*)    All other components within normal limits  CBG MONITORING, ED - Abnormal; Notable for the following components:    Glucose-Capillary 377 (*)    All other components within normal limits  CBC  URINALYSIS, ROUTINE W REFLEX MICROSCOPIC     EKG     RADIOLOGY     PROCEDURES:   Procedures   MEDICATIONS ORDERED IN ED: Medications  sodium chloride 0.9 % bolus 1,000 mL (0 mLs Intravenous Stopped 02/04/22 2006)     IMPRESSION / MDM / [address] / ED COURSE  I reviewed the triage vital signs and the nursing notes.                              Differential diagnosis includes, but is not limited to, hyperglycemia, anxiety, DKA  Patient's presentation is most consistent with acute complicated illness / injury requiring diagnostic workup.   Patient's labs show hyperglycemia, BUN and creatinine are elevated but in the patient's normal trend, remainder her labs are reassuring  Patient  was given 1 L normal saline, glucose did decrease.  She was instructed to go home and take her medications.  She is in agreement treatment plan.  She wants to go home and not be admitted.  She was discharged stable condition.      FINAL CLINICAL IMPRESSION(S) / ED DIAGNOSES   Final diagnoses:  Hyperglycemia     Rx / DC Orders   ED Discharge Orders     None        Note:  This document was prepared using Dragon voice recognition software and may include unintentional dictation errors.    Versie Starks, PA-C 02/04/22 2349    Harvest Dark, MD 02/04/22 2352

## 2022-02-05 ENCOUNTER — Encounter: Payer: Self-pay | Admitting: Emergency Medicine

## 2022-02-05 ENCOUNTER — Emergency Department
Admission: EM | Admit: 2022-02-05 | Discharge: 2022-02-05 | Disposition: A | Discharge status: Home / Self Care | Payer: Medicaid Other | Attending: Emergency Medicine | Admitting: Emergency Medicine

## 2022-02-05 ENCOUNTER — Other Ambulatory Visit: Payer: Self-pay

## 2022-02-05 DIAGNOSIS — M25552 Pain in left hip: Secondary | ICD-10-CM | POA: Insufficient documentation

## 2022-02-05 DIAGNOSIS — E1122 Type 2 diabetes mellitus with diabetic chronic kidney disease: Secondary | ICD-10-CM | POA: Insufficient documentation

## 2022-02-05 DIAGNOSIS — R109 Unspecified abdominal pain: Secondary | ICD-10-CM | POA: Diagnosis not present

## 2022-02-05 DIAGNOSIS — N189 Chronic kidney disease, unspecified: Secondary | ICD-10-CM | POA: Insufficient documentation

## 2022-02-05 DIAGNOSIS — R739 Hyperglycemia, unspecified: Secondary | ICD-10-CM

## 2022-02-05 DIAGNOSIS — E1165 Type 2 diabetes mellitus with hyperglycemia: Secondary | ICD-10-CM | POA: Diagnosis not present

## 2022-02-05 DIAGNOSIS — R110 Nausea: Secondary | ICD-10-CM | POA: Insufficient documentation

## 2022-02-05 DIAGNOSIS — F419 Anxiety disorder, unspecified: Secondary | ICD-10-CM | POA: Insufficient documentation

## 2022-02-05 LAB — COMPREHENSIVE METABOLIC PANEL
ALT: 20 U/L (ref 0–44)
AST: 23 U/L (ref 15–41)
Albumin: 4 g/dL (ref 3.5–5.0)
Alkaline Phosphatase: 85 U/L (ref 38–126)
Anion gap: 15 (ref 5–15)
BUN: 26 mg/dL — ABNORMAL HIGH (ref 8–23)
CO2: 27 mmol/L (ref 22–32)
Calcium: 9 mg/dL (ref 8.9–10.3)
Chloride: 93 mmol/L — ABNORMAL LOW (ref 98–111)
Creatinine, Ser: 1.34 mg/dL — ABNORMAL HIGH (ref 0.44–1.00)
GFR, Estimated: 44 mL/min — ABNORMAL LOW (ref >60)
Glucose, Bld: 362 mg/dL — ABNORMAL HIGH (ref 70–99)
Potassium: 3.6 mmol/L (ref 3.5–5.1)
Sodium: 135 mmol/L (ref 135–145)
Total Bilirubin: 1.1 mg/dL (ref 0.3–1.2)
Total Protein: 6.7 g/dL (ref 6.5–8.1)

## 2022-02-05 LAB — CBC
HCT: 41.4 % (ref 36.0–46.0)
Hemoglobin: 13.6 g/dL (ref 12.0–15.0)
MCH: 31.4 pg (ref 26.0–34.0)
MCHC: 32.9 g/dL (ref 30.0–36.0)
MCV: 95.6 fL (ref 80.0–100.0)
Platelets: 213 10*3/uL (ref 150–400)
RBC: 4.33 MIL/uL (ref 3.87–5.11)
RDW: 13.2 % (ref 11.5–15.5)
WBC: 8.5 10*3/uL (ref 4.0–10.5)
nRBC: 0 % (ref 0.0–0.2)

## 2022-02-05 LAB — CBG MONITORING, ED
Glucose-Capillary: 345 mg/dL — ABNORMAL HIGH (ref 70–99)
Glucose-Capillary: 269 mg/dL — ABNORMAL HIGH (ref 70–99)

## 2022-02-05 LAB — LIPASE, BLOOD: Lipase: 29 U/L (ref 11–51)

## 2022-02-05 MED ORDER — OXYCODONE-ACETAMINOPHEN 5-325 MG PO TABS
1.0000 | ORAL_TABLET | Freq: Once | ORAL | Status: AC
  Administered 2022-02-05: 1 via ORAL
  Filled 2022-02-05: qty 1

## 2022-02-05 MED ORDER — ALUM & MAG HYDROXIDE-SIMETH 200-200-20 MG/5ML PO SUSP
15.0000 mL | Freq: Once | ORAL | Status: AC
  Administered 2022-02-05: 15 mL via ORAL
  Filled 2022-02-05: qty 30

## 2022-02-05 MED ORDER — INSULIN ASPART 100 UNIT/ML IJ SOLN
8.0000 [IU] | Freq: Once | INTRAMUSCULAR | Status: AC
  Administered 2022-02-05: 8 [IU] via SUBCUTANEOUS
  Filled 2022-02-05: qty 1

## 2022-02-05 MED ORDER — CYCLOBENZAPRINE HCL 10 MG PO TABS: 10.0000 mg | ORAL_TABLET | Freq: Three times a day (TID) | ORAL | 0 refills | Status: AC | PRN

## 2022-02-05 MED ORDER — SODIUM CHLORIDE 0.9 % IV BOLUS
500.0000 mL | Freq: Once | INTRAVENOUS | Status: AC
  Administered 2022-02-05: 500 mL via INTRAVENOUS

## 2022-02-05 MED ORDER — ONDANSETRON HCL 4 MG/2ML IJ SOLN
4.0000 mg | Freq: Once | INTRAMUSCULAR | Status: AC
  Administered 2022-02-05: 4 mg via INTRAVENOUS
  Filled 2022-02-05: qty 2

## 2022-02-05 NOTE — ED Provider Notes (Signed)
[facility] Provider Note    Event Date/Time   First MD Initiated Contact with Patient 02/05/22 [phone]     (approximate)   History   Abdominal Pain and Nausea   HPI  [name] is a 66 y.o. female with a history of diabetes, CKD, chronic back pain and anxiety who presents with multiple complaints, but primarily left hip pain and nausea.  The patient states that her left hip has been hurting her since she fell on it a couple weeks ago.  She had negative x-rays and has been taking some tramadol and Tylenol intermittently with partial relief.  The patient states that her daughter is currently admitted to the hospital and she has been stressed and anxious.  She came to the ED last night after she had an anxiety attack and was also hyperglycemic.  She got fluids and the sugar went down somewhat.  Early this morning the patient started having nausea and dry heaving.  She also had a few loose bowel movements.  She denies any abdominal pain.  She has no fever or chills or any urinary symptoms.  I reviewed the past medical records; I confirmed that the patient was seen in the ED last night for anxiety and hyperglycemia.  She was seen on 12/23 for the left hip pain and had negative x-rays at that time.  She was also seen at the [facility] ED in [place] on 12/20 for back pain.  She has several other prior ED visits over the last year but no admissions.    Physical Exam   Triage Vital Signs: ED Triage Vitals  Enc Vitals Group     BP 02/05/22 0446 128/66     Pulse Rate 02/05/22 0446 98     Resp 02/05/22 0446 (!) 23     Temp 02/05/22 0446 (!) 97.5 F (36.4 C)     Temp Source 02/05/22 0446 Oral     SpO2 02/05/22 0446 100 %     Weight 02/05/22 0444 190 lb (86.2 kg)     Height 02/05/22 0444 '5\' 4"'$  (1.626 m)     Head Circumference --      Peak Flow --      Pain Score 02/05/22 0444 10     Pain Loc --      Pain Edu? --      Excl. in [place]? --     Most recent vital  signs: Vitals:   02/05/22 0446 02/05/22 0803  BP: 128/66 120/60  Pulse: 98 90  Resp: (!) 23 20  Temp: (!) 97.5 F (36.4 C) 98 F (36.7 C)  SpO2: 100% 100%     General: Alert and oriented, anxious appearing but in no acute distress. CV:  Good peripheral perfusion.  Resp:  Normal effort.  Abd:  Soft and nontender.  No distention.  Other:  Slightly dry mucous membranes.  Left hip with full range of motion, no bony tenderness or deformity.   ED Results / Procedures / Treatments   Labs (all labs ordered are listed, but only abnormal results are displayed) Labs Reviewed  COMPREHENSIVE METABOLIC PANEL - Abnormal; Notable for the following components:      Result Value   Chloride 93 (*)    Glucose, Bld 362 (*)    BUN 26 (*)    Creatinine, Ser 1.34 (*)    GFR, Estimated 44 (*)    All other components within normal limits  CBG MONITORING, ED - Abnormal; Notable for  the following components:   Glucose-Capillary 345 (*)    All other components within normal limits  LIPASE, BLOOD  CBC  URINALYSIS, ROUTINE W REFLEX MICROSCOPIC     EKG     RADIOLOGY     PROCEDURES:  Critical Care performed: No  Procedures   MEDICATIONS ORDERED IN ED: Medications - No data to display   IMPRESSION / MDM / [place] / ED COURSE  I reviewed the triage vital signs and the nursing notes.  66 year old female with PMH as noted above presents with multiple symptoms, but primarily reports ongoing left hip pain from a fall few weeks ago, elevated glucose, and nausea and dry heaving.  On exam she appears quite anxious but there are no other significant findings.  The abdomen is soft and nontender.  Initial labs showed glucose in the 300s but no elevation of the anion gap or significant electrolyte abnormalities.  Differential diagnosis includes, but is not limited to:  1.  Left hip pain: This is consistent with contusion.  The pain is subacute and is partially relieved with  tramadol and Tylenol the patient has been taking them inconsistently.  There is no indication for repeat imaging.  We will give a Percocet here and consider prescription for a muscle relaxant for home.  2.  Hyperglycemia: This is consistent with simple hyperglycemia likely related to her not taking her medications since yesterday.  There is no evidence of DKA or other acute complication.  We will give fluids and a dose of insulin.  3.  Nausea: Differential includes hyperglycemia, gastroenteritis, gastritis, gastroparesis, or symptoms related to anxiety.  We will give fluids and Zofran.  The patient has no significant abdominal pain or any tenderness.  There is no indication for imaging.  Patient's presentation is most consistent with acute complicated illness / injury requiring diagnostic workup.  ----------------------------------------- 11:27 AM on 02/05/2022 -----------------------------------------  Glucose is now down to the 200s.  The patient reports well-controlled pain.  She has been tolerating p.o.  She reports slight indigestion and is requesting Maalox.  Given that the pain is controlled, hyperglycemia has resolved and she has no other acute symptoms, she is stable for discharge at this time.  I counseled her on the results of the workup.  I recommend that she restart on her diabetes medications.  She can take the tramadol and Tylenol that she has at home for pain.  I have prescribed a muscle relaxant which was suggested to her previously.  I gave the patient strict return precautions and she expressed understanding.  FINAL CLINICAL IMPRESSION(S) / ED DIAGNOSES   Final diagnoses:  None     Rx / DC Orders   ED Discharge Orders     None        Note:  This document was prepared using Dragon voice recognition software and may include unintentional dictation errors.    Arta Silence, MD 02/05/22 1209

## 2022-02-05 NOTE — Discharge Instructions (Signed)
Continue taking your diabetes medicine as prescribed.  Take the tylenol and/or tramadol for pain; we have added a prescription for a muscle relaxant.  Return to the ER for new, worsening or persistent pain, inability to walk, high blood sugar readings, vomiting, weakness or any other new or worsening symptoms that concern you.

## 2022-02-05 NOTE — ED Triage Notes (Signed)
Pt to EDvia wheelchair from her daughter's room. Pt's daughter is admitted to hospital at this time. Pt c/o increasing frequency of diarrhea, and nausea. Reports has not eating anything however has high blood sugar. Pt with intermittent dry heaves.

## 2022-02-06 ENCOUNTER — Other Ambulatory Visit: Payer: Self-pay

## 2022-02-06 ENCOUNTER — Emergency Department
Admission: EM | Admit: 2022-02-06 | Discharge: 2022-02-06 | Disposition: A | Discharge status: Home / Self Care | Payer: Medicaid Other | Attending: Emergency Medicine | Admitting: Emergency Medicine

## 2022-02-06 DIAGNOSIS — R000 Tachycardia, unspecified: Secondary | ICD-10-CM | POA: Diagnosis not present

## 2022-02-06 DIAGNOSIS — M25552 Pain in left hip: Secondary | ICD-10-CM | POA: Insufficient documentation

## 2022-02-06 DIAGNOSIS — R739 Hyperglycemia, unspecified: Secondary | ICD-10-CM | POA: Insufficient documentation

## 2022-02-06 LAB — URINALYSIS, ROUTINE W REFLEX MICROSCOPIC
Bilirubin Urine: NEGATIVE
Glucose, UA: >=500 mg/dL — AB
Ketones, ur: 20 mg/dL — AB
Nitrite: NEGATIVE
Protein, ur: >=300 mg/dL — AB
Specific Gravity, Urine: 1.016 (ref 1.005–1.030)
pH: 5 (ref 5.0–8.0)

## 2022-02-06 LAB — BASIC METABOLIC PANEL
Anion gap: 15 (ref 5–15)
BUN: 22 mg/dL (ref 8–23)
CO2: 25 mmol/L (ref 22–32)
Calcium: 8.8 mg/dL — ABNORMAL LOW (ref 8.9–10.3)
Chloride: 96 mmol/L — ABNORMAL LOW (ref 98–111)
Creatinine, Ser: 1.33 mg/dL — ABNORMAL HIGH (ref 0.44–1.00)
GFR, Estimated: 45 mL/min — ABNORMAL LOW (ref >60)
Glucose, Bld: 404 mg/dL — ABNORMAL HIGH (ref 70–99)
Potassium: 3.5 mmol/L (ref 3.5–5.1)
Sodium: 136 mmol/L (ref 135–145)

## 2022-02-06 LAB — CBC
HCT: 41 % (ref 36.0–46.0)
Hemoglobin: 13.6 g/dL (ref 12.0–15.0)
MCH: 31.6 pg (ref 26.0–34.0)
MCHC: 33.2 g/dL (ref 30.0–36.0)
MCV: 95.1 fL (ref 80.0–100.0)
Platelets: 212 10*3/uL (ref 150–400)
RBC: 4.31 MIL/uL (ref 3.87–5.11)
RDW: 13.2 % (ref 11.5–15.5)
WBC: 8 10*3/uL (ref 4.0–10.5)
nRBC: 0 % (ref 0.0–0.2)

## 2022-02-06 LAB — CBG MONITORING, ED
Glucose-Capillary: 404 mg/dL — ABNORMAL HIGH (ref 70–99)
Glucose-Capillary: 367 mg/dL — ABNORMAL HIGH (ref 70–99)
Glucose-Capillary: 317 mg/dL — ABNORMAL HIGH (ref 70–99)

## 2022-02-06 MED ORDER — SODIUM CHLORIDE 0.9 % IV BOLUS
1000.0000 mL | Freq: Once | INTRAVENOUS | Status: AC
  Administered 2022-02-06: 1000 mL via INTRAVENOUS

## 2022-02-06 MED ORDER — ACETAMINOPHEN 500 MG PO TABS
1000.0000 mg | ORAL_TABLET | Freq: Once | ORAL | Status: AC
  Administered 2022-02-06: 1000 mg via ORAL
  Filled 2022-02-06: qty 2

## 2022-02-06 MED ORDER — INSULIN ASPART 100 UNIT/ML IJ SOLN
10.0000 [IU] | Freq: Once | INTRAMUSCULAR | Status: AC
  Administered 2022-02-06: 10 [IU] via INTRAVENOUS
  Filled 2022-02-06: qty 1

## 2022-02-06 NOTE — ED Triage Notes (Signed)
Pt states she has been upstairs with her daughter and has been seen here in the ER twice before today in the last couple days- pt states that she has been having problems with her blood sugar being high, stating it was in the 500s last time- pt also having hip pain that she says the doctor gave her percocet for- pt has not been able to get any of her medications d/t being here at the hospital

## 2022-02-06 NOTE — ED Provider Notes (Signed)
[facility] Provider Note    Event Date/Time   First MD Initiated Contact with Patient 02/06/22 1400     (approximate)   History   Hyperglycemia   HPI  [name] is a 66 y.o. female presents to the emergency department for hyperglycemia.  Patient states that she is currently in the hospital with her daughter and does not have her insulin or glucometer.  Was feeling shaky.  Came into the emergency department in order to check her sugar and see if she needed any insulin.  Also complaining of left hip pain which she states that she has been dealing with for quite some time.  Requesting for pain medication for her left hip.  Denies any new falls or trauma.  Evaluated yesterday in the emergency department for hyperglycemia and hip pain.     Physical Exam   Triage Vital Signs: ED Triage Vitals  Enc Vitals Group     BP 02/06/22 1257 122/79     Pulse Rate 02/06/22 1257 (!) 109     Resp 02/06/22 1257 18     Temp 02/06/22 1257 99 F (37.2 C)     Temp Source 02/06/22 1257 Oral     SpO2 02/06/22 1257 99 %     Weight --      Height --      Head Circumference --      Peak Flow --      Pain Score 02/06/22 1255 7     Pain Loc --      Pain Edu? --      Excl. in [place]? --     Most recent vital signs: Vitals:   02/06/22 1257 02/06/22 1612  BP: 122/79 (!) 150/120  Pulse: (!) 109 98  Resp: 18 20  Temp: 99 F (37.2 C) 98 F (36.7 C)  SpO2: 99% 97%    Physical Exam Constitutional:      Appearance: She is well-developed.  HENT:     Head: Atraumatic.  Eyes:     Conjunctiva/sclera: Conjunctivae normal.  Cardiovascular:     Rate and Rhythm: Regular rhythm. Tachycardia present.  Pulmonary:     Effort: No respiratory distress.  Abdominal:     General: There is no distension.  Musculoskeletal:        General: Normal range of motion.     Cervical back: Normal range of motion.  Skin:    General: Skin is warm.     Capillary Refill: Capillary refill  takes less than 2 seconds.  Neurological:     Mental Status: She is alert. Mental status is at baseline.     IMPRESSION / MDM / ASSESSMENT AND PLAN / ED COURSE  I reviewed the triage vital signs and the nursing notes.  Differential diagnosis including glycemia, electrolyte abnormality, DKA, HHS.   Do not feel that further imaging of her left hip is necessary at this time.  No new falls or trauma.  Able to stand in the emergency department without any difficulties.  LABS (all labs ordered are listed, but only abnormal results are displayed) Labs interpreted as -  Patient with hyperglycemia but no signs of DKA. Patient denies any symptoms of urinary tract infection, signs of possible urinary tract infection however do not feel that treatment is necessary at this time given that she is not having any symptoms of UTI.  Labs Reviewed  BASIC METABOLIC PANEL - Abnormal; Notable for the following components:      Result  Value   Chloride 96 (*)    Glucose, Bld 404 (*)    Creatinine, Ser 1.33 (*)    Calcium 8.8 (*)    GFR, Estimated 45 (*)    All other components within normal limits  URINALYSIS, ROUTINE W REFLEX MICROSCOPIC - Abnormal; Notable for the following components:   Color, Urine YELLOW (*)    APPearance CLOUDY (*)    Glucose, UA >=500 (*)    Hgb urine dipstick SMALL (*)    Ketones, ur 20 (*)    Protein, ur >=300 (*)    Leukocytes,Ua TRACE (*)    Bacteria, UA FEW (*)    All other components within normal limits  CBG MONITORING, ED - Abnormal; Notable for the following components:   Glucose-Capillary 404 (*)    All other components within normal limits  CBG MONITORING, ED - Abnormal; Notable for the following components:   Glucose-Capillary 367 (*)    All other components within normal limits  CBG MONITORING, ED - Abnormal; Notable for the following components:   Glucose-Capillary 317 (*)    All other components within normal limits  CBC  CBG MONITORING, ED  CBG  MONITORING, ED    TREATMENT   1 L of IV fluid Repeat glucose check 400s, given 10 units of insulin  Improving glucose now down to the 319.  Patient's friend at bedside and states that she could drive her to her car so she is able to drive home and get her insulin.  Also states that she has not been taking her home medications.  Took her home medications while in the emergency department.  Continues to be hypertensive however likely secondary to medication noncompliance given that she has been in the hospital and not had access to her medications.  Encouraged to get her home medications so that she will be able to care for herself and her daughter at time of discharge.   PROCEDURES:  Critical Care performed: No  Procedures  Patient's presentation is most consistent with acute presentation with potential threat to life or bodily function.   MEDICATIONS ORDERED IN ED: Medications  sodium chloride 0.9 % bolus 1,000 mL (0 mLs Intravenous Stopped 02/06/22 1607)  insulin aspart (novoLOG) injection 10 Units (10 Units Intravenous Given 02/06/22 1459)  acetaminophen (TYLENOL) tablet 1,000 mg (1,000 mg Oral Given 02/06/22 1540)    FINAL CLINICAL IMPRESSION(S) / ED DIAGNOSES   Final diagnoses:  Hyperglycemia     Rx / DC Orders   ED Discharge Orders     None        Note:  This document was prepared using Dragon voice recognition software and may include unintentional dictation errors.   Nathaniel Man, MD 02/06/22 [phone]

## 2022-05-17 ENCOUNTER — Emergency Department: Payer: Medicare Other

## 2022-05-17 ENCOUNTER — Emergency Department
Admission: EM | Admit: 2022-05-17 | Discharge: 2022-05-17 | Disposition: A | Discharge status: Home / Self Care | Payer: Medicare Other | Attending: Emergency Medicine | Admitting: Emergency Medicine

## 2022-05-17 DIAGNOSIS — Y92410 Unspecified street and highway as the place of occurrence of the external cause: Secondary | ICD-10-CM | POA: Diagnosis not present

## 2022-05-17 DIAGNOSIS — M25561 Pain in right knee: Secondary | ICD-10-CM | POA: Diagnosis not present

## 2022-05-17 DIAGNOSIS — M25562 Pain in left knee: Secondary | ICD-10-CM | POA: Diagnosis present
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
## 2023-11-22 ENCOUNTER — Other Ambulatory Visit: Payer: Self-pay

## 2023-11-22 ENCOUNTER — Emergency Department

## 2023-11-22 ENCOUNTER — Emergency Department
Admission: EM | Admit: 2023-11-22 | Discharge: 2023-11-22 | Disposition: A | Discharge status: Home / Self Care | Attending: Emergency Medicine | Admitting: Emergency Medicine

## 2023-11-22 DIAGNOSIS — E1122 Type 2 diabetes mellitus with diabetic chronic kidney disease: Secondary | ICD-10-CM | POA: Insufficient documentation

## 2023-11-22 DIAGNOSIS — N189 Chronic kidney disease, unspecified: Secondary | ICD-10-CM | POA: Insufficient documentation

## 2023-11-22 DIAGNOSIS — M170 Bilateral primary osteoarthritis of knee: Secondary | ICD-10-CM | POA: Diagnosis not present

## 2023-11-22 DIAGNOSIS — R0602 Shortness of breath: Secondary | ICD-10-CM | POA: Insufficient documentation

## 2023-11-22 DIAGNOSIS — M25561 Pain in right knee: Secondary | ICD-10-CM | POA: Diagnosis present

## 2023-11-22 LAB — CBC
HCT: 41.4 % (ref 36.0–46.0)
Hemoglobin: 13.7 g/dL (ref 12.0–15.0)
MCH: 31.6 pg (ref 26.0–34.0)
MCHC: 33.1 g/dL (ref 30.0–36.0)
MCV: 95.4 fL (ref 80.0–100.0)
Platelets: 195 K/uL (ref 150–400)
RBC: 4.34 MIL/uL (ref 3.87–5.11)
RDW: 13.8 % (ref 11.5–15.5)
WBC: 7.4 K/uL (ref 4.0–10.5)
nRBC: 0 % (ref 0.0–0.2)

## 2023-11-22 LAB — BASIC METABOLIC PANEL WITH GFR
Anion gap: 19 — ABNORMAL HIGH (ref 5–15)
BUN: 39 mg/dL — ABNORMAL HIGH (ref 8–23)
CO2: 26 mmol/L (ref 22–32)
Calcium: 9.6 mg/dL (ref 8.9–10.3)
Chloride: 94 mmol/L — ABNORMAL LOW (ref 98–111)
Creatinine, Ser: 1.94 mg/dL — ABNORMAL HIGH (ref 0.44–1.00)
GFR, Estimated: 28 mL/min — ABNORMAL LOW (ref >60)
Glucose, Bld: 205 mg/dL — ABNORMAL HIGH (ref 70–99)
Potassium: 4.5 mmol/L (ref 3.5–5.1)
Sodium: 139 mmol/L (ref 135–145)

## 2023-11-22 LAB — BRAIN NATRIURETIC PEPTIDE: B Natriuretic Peptide: 478.4 pg/mL — ABNORMAL HIGH (ref 0.0–100.0)

## 2023-11-22 MED ORDER — TRAMADOL HCL 50 MG PO TABS
50.0000 mg | ORAL_TABLET | Freq: Once | ORAL | Status: AC
  Administered 2023-11-22: 50 mg via ORAL
  Filled 2023-11-22: qty 1

## 2023-11-22 MED ORDER — LORAZEPAM 0.5 MG PO TABS
0.5000 mg | ORAL_TABLET | Freq: Once | ORAL | Status: DC
  Filled 2023-11-22: qty 1

## 2023-11-22 NOTE — ED Triage Notes (Signed)
 First Nurse Note: Patient to ED via [facility] from home for SOB. Started today with swelling in her ankles. Hx CHF, diabetes, HTN.   132/70 16 rr 96% RA 90 HR 211 cbg

## 2023-11-22 NOTE — ED Triage Notes (Signed)
 PT also c/o bilateral knee pain. Denies injury. She is AxOx4.

## 2023-11-22 NOTE — ED Provider Notes (Signed)
 [facility] Provider Note    Event Date/Time   First MD Initiated Contact with Patient 11/22/23 1159     (approximate)   History   Shortness of Breath and Knee Pain   HPI  [name] is a 68 y.o. female with a history of anxiety, CKD, diabetes, chronic knee pain who presents with multiple complaints including anxiety, knee discomfort, mild shortness of breath which she reports is now resolved.  She states that she needs to have knee replacements.  She is anxious because her daughter is in the hospital as well.  Has multiple stressors at home.  No SI or HI.  She reports her breathing is better now     Physical Exam   Triage Vital Signs: ED Triage Vitals  Encounter Vitals Group     BP 11/22/23 1150 136/89     Girls Systolic BP Percentile --      Girls Diastolic BP Percentile --      Boys Systolic BP Percentile --      Boys Diastolic BP Percentile --      Pulse Rate 11/22/23 1150 85     Resp 11/22/23 1150 17     Temp 11/22/23 1150 98.6 F (37 C)     Temp src --      SpO2 11/22/23 1150 98 %     Weight --      Height --      Head Circumference --      Peak Flow --      Pain Score 11/22/23 1149 1     Pain Loc --      Pain Education --      Exclude from Growth Chart --     Most recent vital signs: Vitals:   11/22/23 1150 11/22/23 1336  BP: 136/89 122/75  Pulse: 85 85  Resp: 17 20  Temp: 98.6 F (37 C) 98.4 F (36.9 C)  SpO2: 98% 98%     General: Awake, no distress.  CV:  Good peripheral perfusion.  Regular rate and rhythm Resp:  Normal effort.  Clear to auscultation bilaterally Abd:  No distention.  Other:  1+ edema bilaterally   ED Results / Procedures / Treatments   Labs (all labs ordered are listed, but only abnormal results are displayed) Labs Reviewed  BASIC METABOLIC PANEL WITH GFR - Abnormal; Notable for the following components:      Result Value   Chloride 94 (*)    Glucose, Bld 205 (*)    BUN 39 (*)     Creatinine, Ser 1.94 (*)    GFR, Estimated 28 (*)    Anion gap 19 (*)    All other components within normal limits  BRAIN NATRIURETIC PEPTIDE - Abnormal; Notable for the following components:   B Natriuretic Peptide 478.4 (*)    All other components within normal limits  CBC     EKG ED ECG REPORT I, Lamar Price, the attending physician, personally viewed and interpreted this ECG.  Date: 11/22/2023  Rhythm: normal sinus rhythm QRS Axis: normal Intervals: Incomplete right bundle branch block ST/T Wave abnormalities: normal Narrative Interpretation: no evidence of acute ischemia    RADIOLOGY Chest x-ray viewed interpret by me, no acute abnormality    PROCEDURES:  Critical Care performed:   Procedures   MEDICATIONS ORDERED IN ED: Medications  traMADol  (ULTRAM ) tablet 50 mg (50 mg Oral Given 11/22/23 1243)     IMPRESSION / MDM / ASSESSMENT AND PLAN / ED  COURSE  I reviewed the triage vital signs and the nursing notes. Patient's presentation is most consistent with exacerbation of chronic illness.  Patient presents with complaints as above, reported brief episode of mild shortness of breath, does have some lower extremity edema which is chronic.  Will obtain x-ray, labs, BNP  Will give p.o. tramadol  for knee pain, she declined Ativan  for anxiety.  Labs and x-ray overall reassuring, mild elevation of BNP but no tachypnea or increased work of breathing or oxygen requirement, appropriate for discharge with close outpatient follow-up      FINAL CLINICAL IMPRESSION(S) / ED DIAGNOSES   Final diagnoses:  Osteoarthritis of both knees, unspecified osteoarthritis type     Rx / DC Orders   ED Discharge Orders     None        Note:  This document was prepared using Dragon voice recognition software and may include unintentional dictation errors.   Arlander [place], MD 11/22/23 1359
